# Patient Record
Sex: Male | Born: 1955
Health system: Southern US, Community
[De-identification: ages and names within clinical notes are randomized; demographics above are authoritative.]

## PROBLEM LIST (undated history)

## (undated) DIAGNOSIS — I4891 Unspecified atrial fibrillation: Secondary | ICD-10-CM

## (undated) DIAGNOSIS — R55 Syncope and collapse: Secondary | ICD-10-CM

## (undated) DIAGNOSIS — R7989 Other specified abnormal findings of blood chemistry: Secondary | ICD-10-CM

## (undated) DIAGNOSIS — I5189 Other ill-defined heart diseases: Secondary | ICD-10-CM

## (undated) DIAGNOSIS — R0981 Nasal congestion: Secondary | ICD-10-CM

## (undated) DIAGNOSIS — K8042 Calculus of bile duct with acute cholecystitis without obstruction: Secondary | ICD-10-CM

## (undated) DIAGNOSIS — N183 Chronic kidney disease, stage 3 unspecified: Secondary | ICD-10-CM

## (undated) DIAGNOSIS — E119 Type 2 diabetes mellitus without complications: Secondary | ICD-10-CM

## (undated) DIAGNOSIS — I7121 Aneurysm of the ascending aorta, without rupture: Secondary | ICD-10-CM

## (undated) DIAGNOSIS — Z9989 Dependence on other enabling machines and devices: Secondary | ICD-10-CM

## (undated) DIAGNOSIS — G4733 Obstructive sleep apnea (adult) (pediatric): Secondary | ICD-10-CM

## (undated) DIAGNOSIS — K859 Acute pancreatitis without necrosis or infection, unspecified: Secondary | ICD-10-CM

## (undated) DIAGNOSIS — I2510 Atherosclerotic heart disease of native coronary artery without angina pectoris: Secondary | ICD-10-CM

## (undated) DIAGNOSIS — M1990 Unspecified osteoarthritis, unspecified site: Secondary | ICD-10-CM

## (undated) DIAGNOSIS — I350 Nonrheumatic aortic (valve) stenosis: Secondary | ICD-10-CM

## (undated) DIAGNOSIS — IMO0001 Reserved for inherently not codable concepts without codable children: Secondary | ICD-10-CM

## (undated) DIAGNOSIS — R945 Abnormal results of liver function studies: Secondary | ICD-10-CM

## (undated) DIAGNOSIS — N200 Calculus of kidney: Secondary | ICD-10-CM

## (undated) DIAGNOSIS — R202 Paresthesia of skin: Secondary | ICD-10-CM

## (undated) DIAGNOSIS — K8050 Calculus of bile duct without cholangitis or cholecystitis without obstruction: Secondary | ICD-10-CM

## (undated) DIAGNOSIS — R918 Other nonspecific abnormal finding of lung field: Secondary | ICD-10-CM

## (undated) DIAGNOSIS — R200 Anesthesia of skin: Secondary | ICD-10-CM

## (undated) DIAGNOSIS — G4700 Insomnia, unspecified: Secondary | ICD-10-CM

## (undated) DIAGNOSIS — J329 Chronic sinusitis, unspecified: Secondary | ICD-10-CM

## (undated) DIAGNOSIS — G5600 Carpal tunnel syndrome, unspecified upper limb: Secondary | ICD-10-CM

## (undated) DIAGNOSIS — Z87442 Personal history of urinary calculi: Secondary | ICD-10-CM

## (undated) DIAGNOSIS — K760 Fatty (change of) liver, not elsewhere classified: Secondary | ICD-10-CM

## (undated) DIAGNOSIS — E785 Hyperlipidemia, unspecified: Secondary | ICD-10-CM

## (undated) DIAGNOSIS — I6529 Occlusion and stenosis of unspecified carotid artery: Secondary | ICD-10-CM

## (undated) DIAGNOSIS — I10 Essential (primary) hypertension: Secondary | ICD-10-CM

## (undated) HISTORY — DX: Acute pancreatitis without necrosis or infection, unspecified: K85.90

## (undated) HISTORY — DX: Calculus of bile duct without cholangitis or cholecystitis without obstruction: K80.50

## (undated) HISTORY — DX: Paresthesia of skin: R20.2

## (undated) HISTORY — DX: Carpal tunnel syndrome, unspecified upper limb: G56.00

## (undated) HISTORY — DX: Essential (primary) hypertension: I10

## (undated) HISTORY — DX: Nonrheumatic aortic (valve) stenosis: I35.0

## (undated) HISTORY — DX: Other specified abnormal findings of blood chemistry: R79.89

## (undated) HISTORY — DX: Nasal congestion: R09.81

## (undated) HISTORY — DX: Fatty (change of) liver, not elsewhere classified: K76.0

## (undated) HISTORY — DX: Type 2 diabetes mellitus without complications: E11.9

## (undated) HISTORY — DX: Calculus of bile duct with acute cholecystitis without obstruction: K80.42

## (undated) HISTORY — DX: Abnormal results of liver function studies: R94.5

## (undated) HISTORY — PX: CORONARY ANGIOPLASTY WITH STENT PLACEMENT: SHX49

## (undated) HISTORY — DX: Hyperlipidemia, unspecified: E78.5

## (undated) HISTORY — DX: Obstructive sleep apnea (adult) (pediatric): G47.33

## (undated) HISTORY — DX: Calculus of kidney: N20.0

## (undated) HISTORY — DX: Reserved for inherently not codable concepts without codable children: IMO0001

## (undated) HISTORY — DX: Unspecified atrial fibrillation: I48.91

## (undated) HISTORY — DX: Other ill-defined heart diseases: I51.89

## (undated) HISTORY — DX: Chronic sinusitis, unspecified: J32.9

## (undated) HISTORY — DX: Dependence on other enabling machines and devices: Z99.89

## (undated) HISTORY — DX: Atherosclerotic heart disease of native coronary artery without angina pectoris: I25.10

## (undated) HISTORY — DX: Unspecified osteoarthritis, unspecified site: M19.90

## (undated) HISTORY — PX: CARPAL TUNNEL RELEASE: SHX101

## (undated) HISTORY — PX: TISSUE AORTIC VALVE REPLACEMENT: SHX2527

---

## 2004-02-19 DIAGNOSIS — R202 Paresthesia of skin: Secondary | ICD-10-CM

## 2004-02-19 HISTORY — DX: Paresthesia of skin: R20.2

## 2004-07-06 DIAGNOSIS — G4733 Obstructive sleep apnea (adult) (pediatric): Secondary | ICD-10-CM

## 2004-07-06 HISTORY — DX: Obstructive sleep apnea (adult) (pediatric): G47.33

## 2004-10-10 ENCOUNTER — Ambulatory Visit (HOSPITAL_BASED_OUTPATIENT_CLINIC_OR_DEPARTMENT_OTHER): Admission: RE | Admit: 2004-10-10 | Discharge: 2004-10-10 | Payer: Self-pay | Admitting: Orthopedic Surgery

## 2004-10-24 ENCOUNTER — Ambulatory Visit (HOSPITAL_BASED_OUTPATIENT_CLINIC_OR_DEPARTMENT_OTHER): Admission: RE | Admit: 2004-10-24 | Discharge: 2004-10-24 | Payer: Self-pay | Admitting: Orthopedic Surgery

## 2005-02-04 ENCOUNTER — Encounter: Payer: Self-pay | Admitting: Cardiology

## 2005-02-05 ENCOUNTER — Observation Stay (HOSPITAL_COMMUNITY): Admission: AD | Admit: 2005-02-05 | Discharge: 2005-02-06 | Payer: Self-pay | Admitting: Cardiology

## 2005-02-05 ENCOUNTER — Ambulatory Visit: Payer: Self-pay | Admitting: *Deleted

## 2005-02-05 ENCOUNTER — Ambulatory Visit: Payer: Self-pay | Admitting: Cardiology

## 2005-02-11 ENCOUNTER — Ambulatory Visit: Payer: Self-pay | Admitting: Cardiology

## 2005-02-25 ENCOUNTER — Ambulatory Visit: Payer: Self-pay | Admitting: Cardiology

## 2005-04-28 ENCOUNTER — Encounter: Payer: Self-pay | Admitting: Cardiology

## 2007-09-01 ENCOUNTER — Encounter: Payer: Self-pay | Admitting: Cardiology

## 2007-09-07 ENCOUNTER — Ambulatory Visit: Payer: Self-pay | Admitting: Cardiology

## 2008-02-04 DIAGNOSIS — I350 Nonrheumatic aortic (valve) stenosis: Secondary | ICD-10-CM

## 2008-02-04 DIAGNOSIS — I5189 Other ill-defined heart diseases: Secondary | ICD-10-CM

## 2008-02-04 DIAGNOSIS — I2510 Atherosclerotic heart disease of native coronary artery without angina pectoris: Secondary | ICD-10-CM

## 2008-02-04 HISTORY — DX: Other ill-defined heart diseases: I51.89

## 2008-02-04 HISTORY — DX: Nonrheumatic aortic (valve) stenosis: I35.0

## 2008-02-04 HISTORY — DX: Atherosclerotic heart disease of native coronary artery without angina pectoris: I25.10

## 2008-12-04 DIAGNOSIS — M1990 Unspecified osteoarthritis, unspecified site: Secondary | ICD-10-CM

## 2008-12-04 HISTORY — DX: Unspecified osteoarthritis, unspecified site: M19.90

## 2009-05-22 ENCOUNTER — Encounter: Payer: Self-pay | Admitting: Cardiology

## 2009-05-24 DIAGNOSIS — I10 Essential (primary) hypertension: Secondary | ICD-10-CM | POA: Insufficient documentation

## 2009-05-24 DIAGNOSIS — E1165 Type 2 diabetes mellitus with hyperglycemia: Secondary | ICD-10-CM

## 2009-05-24 DIAGNOSIS — E876 Hypokalemia: Secondary | ICD-10-CM | POA: Insufficient documentation

## 2009-05-24 DIAGNOSIS — E785 Hyperlipidemia, unspecified: Secondary | ICD-10-CM | POA: Insufficient documentation

## 2009-05-24 DIAGNOSIS — I200 Unstable angina: Secondary | ICD-10-CM | POA: Insufficient documentation

## 2009-05-24 DIAGNOSIS — R079 Chest pain, unspecified: Secondary | ICD-10-CM | POA: Insufficient documentation

## 2009-05-24 DIAGNOSIS — IMO0001 Reserved for inherently not codable concepts without codable children: Secondary | ICD-10-CM | POA: Insufficient documentation

## 2009-05-24 DIAGNOSIS — R5381 Other malaise: Secondary | ICD-10-CM | POA: Insufficient documentation

## 2009-05-24 DIAGNOSIS — R0602 Shortness of breath: Secondary | ICD-10-CM | POA: Insufficient documentation

## 2009-05-24 DIAGNOSIS — R5383 Other fatigue: Secondary | ICD-10-CM

## 2009-05-24 DIAGNOSIS — G4710 Hypersomnia, unspecified: Secondary | ICD-10-CM | POA: Insufficient documentation

## 2009-05-24 DIAGNOSIS — G4733 Obstructive sleep apnea (adult) (pediatric): Secondary | ICD-10-CM | POA: Insufficient documentation

## 2009-05-24 DIAGNOSIS — I959 Hypotension, unspecified: Secondary | ICD-10-CM | POA: Insufficient documentation

## 2009-05-24 DIAGNOSIS — G4730 Sleep apnea, unspecified: Secondary | ICD-10-CM | POA: Insufficient documentation

## 2009-05-27 ENCOUNTER — Ambulatory Visit: Payer: Self-pay | Admitting: Cardiology

## 2009-05-27 DIAGNOSIS — M1990 Unspecified osteoarthritis, unspecified site: Secondary | ICD-10-CM | POA: Insufficient documentation

## 2009-05-27 DIAGNOSIS — I359 Nonrheumatic aortic valve disorder, unspecified: Secondary | ICD-10-CM | POA: Insufficient documentation

## 2009-06-04 ENCOUNTER — Ambulatory Visit: Payer: Self-pay | Admitting: Cardiology

## 2009-06-04 ENCOUNTER — Encounter (INDEPENDENT_AMBULATORY_CARE_PROVIDER_SITE_OTHER): Payer: Self-pay | Admitting: *Deleted

## 2009-06-05 ENCOUNTER — Encounter: Payer: Self-pay | Admitting: Cardiology

## 2009-06-06 ENCOUNTER — Encounter: Payer: Self-pay | Admitting: Cardiology

## 2009-06-07 ENCOUNTER — Ambulatory Visit: Payer: Self-pay | Admitting: Cardiology

## 2009-06-07 ENCOUNTER — Inpatient Hospital Stay (HOSPITAL_BASED_OUTPATIENT_CLINIC_OR_DEPARTMENT_OTHER): Admission: RE | Admit: 2009-06-07 | Discharge: 2009-06-07 | Payer: Self-pay | Admitting: Internal Medicine

## 2009-06-07 ENCOUNTER — Inpatient Hospital Stay (HOSPITAL_COMMUNITY): Admission: AD | Admit: 2009-06-07 | Discharge: 2009-06-08 | Payer: Self-pay | Admitting: Cardiology

## 2009-06-07 ENCOUNTER — Ambulatory Visit: Payer: Self-pay | Admitting: Internal Medicine

## 2009-06-08 ENCOUNTER — Encounter: Payer: Self-pay | Admitting: Cardiology

## 2009-06-14 ENCOUNTER — Encounter: Payer: Self-pay | Admitting: Cardiology

## 2009-06-18 ENCOUNTER — Encounter: Payer: Self-pay | Admitting: Cardiology

## 2009-06-21 ENCOUNTER — Encounter: Payer: Self-pay | Admitting: Cardiology

## 2009-06-24 ENCOUNTER — Telehealth (INDEPENDENT_AMBULATORY_CARE_PROVIDER_SITE_OTHER): Payer: Self-pay | Admitting: *Deleted

## 2009-06-26 ENCOUNTER — Ambulatory Visit (HOSPITAL_COMMUNITY): Admission: RE | Admit: 2009-06-26 | Discharge: 2009-06-26 | Payer: Self-pay | Admitting: Urology

## 2009-07-04 ENCOUNTER — Telehealth (INDEPENDENT_AMBULATORY_CARE_PROVIDER_SITE_OTHER): Payer: Self-pay | Admitting: *Deleted

## 2009-07-09 ENCOUNTER — Ambulatory Visit: Payer: Self-pay | Admitting: Cardiology

## 2009-07-09 DIAGNOSIS — I2510 Atherosclerotic heart disease of native coronary artery without angina pectoris: Secondary | ICD-10-CM | POA: Insufficient documentation

## 2009-07-19 ENCOUNTER — Encounter: Payer: Self-pay | Admitting: Cardiology

## 2010-02-10 ENCOUNTER — Encounter: Payer: Self-pay | Admitting: Cardiology

## 2010-06-13 ENCOUNTER — Encounter: Payer: Self-pay | Admitting: Cardiology

## 2010-07-22 ENCOUNTER — Encounter: Payer: Self-pay | Admitting: Cardiology

## 2010-07-30 ENCOUNTER — Encounter (INDEPENDENT_AMBULATORY_CARE_PROVIDER_SITE_OTHER): Payer: Self-pay | Admitting: *Deleted

## 2010-07-30 ENCOUNTER — Ambulatory Visit
Admission: RE | Admit: 2010-07-30 | Discharge: 2010-07-30 | Payer: Self-pay | Source: Home / Self Care | Attending: Cardiology | Admitting: Cardiology

## 2010-07-30 ENCOUNTER — Encounter: Payer: Self-pay | Admitting: Cardiology

## 2010-07-30 DIAGNOSIS — I739 Peripheral vascular disease, unspecified: Secondary | ICD-10-CM | POA: Insufficient documentation

## 2010-07-30 DIAGNOSIS — I059 Rheumatic mitral valve disease, unspecified: Secondary | ICD-10-CM | POA: Insufficient documentation

## 2010-08-04 ENCOUNTER — Encounter: Payer: Self-pay | Admitting: Cardiology

## 2010-08-05 ENCOUNTER — Encounter: Payer: Self-pay | Admitting: Cardiology

## 2010-08-05 NOTE — Letter (Signed)
Summary: External Correspondence/ OFFICE NOTE [facility]  External Correspondence/ OFFICE NOTE [facility]   Imported By: Dorise Hiss 02/12/2010 16:30:16  _____________________________________________________________________  External Attachment:    Type:   Image     Comment:   External Document

## 2010-08-05 NOTE — Letter (Signed)
Summary: [facility] D/C SUMMARY DR. Orvan Falconer  [facility] D[facility]C SUMMARY DR. Orvan Falconer   Imported By: Zachary George 07/09/2009 13:42:02  _____________________________________________________________________  External Attachment:    Type:   Image     Comment:   External Document

## 2010-08-05 NOTE — Letter (Signed)
Summary: External Correspondence/ OFFICE NOTE [facility]  External Correspondence/ OFFICE NOTE [facility]   Imported By: Dorise Hiss 07/23/2009 14:31:33  _____________________________________________________________________  External Attachment:    Type:   Image     Comment:   External Document

## 2010-08-05 NOTE — Assessment & Plan Note (Signed)
Summary: 1 mo fu reminder-srs   Visit Type:  Follow-up Primary Provider:  Rudi Heap  CC:  follow-up visit.  History of Present Illness: the patient is a 55 year old male with a history of nonobstructive coronary arteries using 2006.  He has an aortic murmur consistent with aortic stenosis.  He had a recent cardiac catheterization which demonstrated normal right heart pressures and a mild gradient across the aortic valve.  There was a high-grade stenosis in the midright coronary artery and underwent percutaneous stenting of the midright coronary artery.  The patient presents for follow-up.  He has been doing well.  He reports no chest pain short of breath orthopnea PND.  He had a recent the kidney stone.  Apparently the stone was retrieved while still on Plavix therapy and he had no bleeding.  The patient denies any orthopnea PND palpitations or syncope.   Preventive Screening-Counseling & Management  Alcohol-Tobacco     Smoking Status: quit     Year Quit: 2004  Current Problems (verified): 1)  Osteoarthritis, Localized  (ICD-715.30) 2)  Aortic Valve Disorders  (ICD-424.1) 3)  Hypertension  (ICD-401.9) 4)  Shortness of Breath  (ICD-786.05) 5)  Unspecified Hypotension  (ICD-458.9) 6)  Unspecified Sleep Apnea  (ICD-780.57) 7)  Hypopotassemia  (ICD-276.8) 8)  Other Malaise and Fatigue  (ICD-780.79) 9)  Chest Pain Unspecified  (ICD-786.50) 10)  Hyperlipidemia  (ICD-272.4) 11)  Dm  (ICD-250.00) 12)  Intermediate Coronary Syndrome  (ICD-411.1) 13)  Hypersomnia With Sleep Apnea Unspecified  (ICD-780.53)  Current Medications (verified): 1)  Lotrel 10-20 Mg Caps (Amlodipine Besy-Benazepril Hcl) .... Take 1 Tablet By Mouth Once A Day 2)  Zocor 40 Mg Tabs (Simvastatin) .... Take 1 Tablet By Mouth Once A Day 3)  Aspirin 81 Mg Tbec (Aspirin) .... Take 1 Tablet By Mouth Once A Day 4)  Nitrostat 0.4 Mg Subl (Nitroglycerin) .[name].. 1 Tablet Under Tongue At Onset of Chest Pain; You May Repeat  Every 5 Minutes For Up To 3 Doses. 5)  Plavix 75 Mg Tabs (Clopidogrel Bisulfate) .... Take 1 Tablet By Mouth Once A Day 6)  Glipizide Xl 2.5 Mg Xr24h-Tab (Glipizide) .... Take 1 Tablet By Mouth Once A Day  Allergies (verified): 1)  ! Sulfa  Comments:  Nurse/Medical Assistant: The patient's medications and allergies were reviewed with the patient and were updated in the Medication and Allergy Lists. Patient brought list to office.  Past History:  Past Medical History: Last updated: 05/24/2009 Substernal Chest pains/acute coronary syndrome, typical features. Diabetes Mellitus  type II Hypertension Dyslipidemia Hx of Obstructive sleep apnea  Mild Hypokalemia       Past Surgical History: Last updated: 05/24/2009  1.  Left heart catheterization.  2.  Selective coronary angiography.  3.  Left ventriculography.  4.     Release of right transverse carpal ligament.  Family History: Last updated: 05/24/2009 Strong family history of CAD in both father and mother Mother had bypass surgery in her 75's Father had BiPap surgery at later age in his 5's  Social History: Last updated: 05/27/2009 Tobacco Use - No.   Risk Factors: Smoking Status: quit (07/09/2009)  Social History: Smoking Status:  quit  Review of Systems  The patient denies fatigue, malaise, fever, weight gain/loss, vision loss, decreased hearing, hoarseness, chest pain, palpitations, shortness of breath, prolonged cough, wheezing, sleep apnea, coughing up blood, abdominal pain, blood in stool, nausea, vomiting, diarrhea, heartburn, incontinence, blood in urine, muscle weakness, joint pain, leg swelling, rash, skin lesions, headache, fainting, dizziness,  depression, anxiety, enlarged lymph nodes, easy bruising or bleeding, and environmental allergies.    Vital Signs:  Patient profile:   55 year old male Height:      70 inches Weight:      218 pounds Pulse rate:   91 / minute BP sitting:   138 / 82  (left  arm) Cuff size:   large  Vitals Entered By: Carlye Grippe (July 09, 2009 3:25 PM) CC: follow-up visit   Physical Exam  Additional Exam:  General: Well-developed, well-nourished in no distress head: Normocephalic and atraumatic eyes PERRLA/EOMI intact, conjunctiva and lids normal nose: No deformity or lesions mouth normal dentition, normal posterior pharynx neck: Supple, no JVD.  No masses, thyromegaly or abnormal cervical nodes, nopulsus parvus et tardus lungs: Normal breath sounds bilaterally without wheezing.  Normal percussion heart: Rectal rate and rhythm with normal S1 and S2, no S3 or S4.  PMI is normal. there is a 3/6 crescendo decrescendo murmur at the right upper sternal border.  Murmurs consistent with aortic stenosis.  abdomen: Normal bowel sounds, abdomen is soft and nontender without masses, organomegaly or hernias noted.  No hepatosplenomegaly musculoskeletal: Back normal, normal gait muscle strength and tone normal pulsus: Pulse is normal in all 4 extremities Extremities: No peripheral pitting edema neurologic: Alert and oriented x 3 skin: Intact without lesions or rashes cervical nodes: No significant adenopathy psychologic: Normal affect    Cardiac Cath  Procedure date:  06/07/2009  Findings:      ANGIOGRAPHIC DATA:  The right coronary artery demonstrates 90% stenosis in the midportion prior to percutaneous intervention and 0% residual luminal narrowing.  There is diffuse disease in the distal right coronary.  Posterolateral segment has a 70-80% stenosis.  The vessel here is small.   CONCLUSION:  Successful percutaneous stenting of the right coronary artery as noted above.   DISPOSITION:  The patient will remain on aspirin and Plavix.  Follow up with Dr. Andee Lineman.  If he continues to have symptoms, some consideration could be given to placing a distal stent, but it is in a small distal area and conservative management likely initially would be  recommended.    Impression & Recommendations:  Problem # 1:  AORTIC VALVE DISORDERS (ICD-424.1) the patient has moderate aortic stenosis.  Is currently asymptomatic we will follow up with an echocardiogram in one year. The following medications were removed from the medication list:    Hydrochlorothiazide 25 Mg Tabs (Hydrochlorothiazide) .[name]... Take one tablet by mouth daily. His updated medication list for this problem includes:    Lotrel 10-20 Mg Caps (Amlodipine besy-benazepril hcl) .[name]... Take 1 tablet by mouth once a day    Nitrostat 0.4 Mg Subl (Nitroglycerin) .[name]... 1 tablet under tongue at onset of chest pain; you may repeat every 5 minutes for up to 3 doses.  Problem # 2:  CORONARY ARTERY DISEASE, S/P PTCA (ICD-414.9) the patient is status-post recent percutaneous coronary intervention with stenting of the right coronary artery.  The patient received a drug alluding stent.  He will remain on aspirin and Plavix.  He reports no recurrent chest pain. His updated medication list for this problem includes:    Lotrel 10-20 Mg Caps (Amlodipine besy-benazepril hcl) .[name]... Take 1 tablet by mouth once a day    Aspirin 81 Mg Tbec (Aspirin) .[name]... Take 1 tablet by mouth once a day    Nitrostat 0.4 Mg Subl (Nitroglycerin) .[name]... 1 tablet under tongue at onset of chest pain; you may repeat every  5 minutes for up to 3 doses.    Plavix 75 Mg Tabs (Clopidogrel bisulfate) .[name]... Take 1 tablet by mouth once a day  Problem # 3:  HYPERTENSION (ICD-401.9) Assessment: Improved  The following medications were removed from the medication list:    Hydrochlorothiazide 25 Mg Tabs (Hydrochlorothiazide) .[name]... Take one tablet by mouth daily. His updated medication list for this problem includes:    Lotrel 10-20 Mg Caps (Amlodipine besy-benazepril hcl) .[name]... Take 1 tablet by mouth once a day    Aspirin 81 Mg Tbec (Aspirin) .[name]... Take 1 tablet by mouth once a day  Problem # 4:  UNSPECIFIED SLEEP APNEA  (ICD-780.57) Assessment: Comment Only  Problem # 5:  HYPERLIPIDEMIA (ICD-272.4) the patient will have follow-up blood work done at [facility]. His updated medication list for this problem includes:    Zocor 40 Mg Tabs (Simvastatin) .[name]... Take 1 tablet by mouth once a day  Patient Instructions: 1)  echo for aortic stenosis in 1 year (just before next visit) 2)  Follow up in 1 year.

## 2010-08-05 NOTE — Miscellaneous (Signed)
  Clinical Lists Changes  Orders: Added new Referral order of 2-D Echocardiogram (2D Echo) - Signed

## 2010-08-07 ENCOUNTER — Encounter: Payer: Self-pay | Admitting: Cardiology

## 2010-08-07 NOTE — Letter (Signed)
Summary: Graded Exercise Tolerance Test  [facility] at [facility] [address]   [place], [place] [address]   Phone: [phone]  Fax: [phone]      [facility]  Graded Exercise Tolerance Test    [name] [name]  Appointment Date:_  Appointment Time:_   Your doctor has ordered a stress test to help determine the condition of your  heart during exercise. If you take blood pressure medicine , ask your doctor if you should take it the day of your test. You may eat a light meal before your test.  Please be sure to bring the copy of your order with you.   You should dress comfortably, for example: Sweat pants, shorts, or skirt, Loose                                                                                                       short sleeved T-shirt, Rubber soled lace-up shoes (tennis shoes)  You will need to arrive 15 minutes before your appointment time. You will also need to enter at the Main Entrance of the hospital and go to the registration desk. They will direct you to the Cardiovascular Department on the third floor.  You will need to plan on being at the hospital for one hour from registration for this appointment.

## 2010-08-07 NOTE — Assessment & Plan Note (Signed)
Summary: 1 YR FU RECV REMINDER VS   Visit Type:  Follow-up Primary Provider:  Rudi Heap   History of Present Illness: the patient is a 55 year old male with history of nonobstructive coronary artery disease by catheterization 2006.  He has known aortic stenosis.  Catheterization a little over a year ago demonstrated normal right heart pressures and a mild gradient across the aortic valve, very calcified valve visible by fluoroscopy.  The patient did have a high-grade stenosis in the midright coronary artery and underwent stent placement.  The patient had a recent follow-up echocardiogram done in his aortic stenosis is now more severe with an outflow tract velocity of 3.87 m/sec.  Clinically the patient also reports that he has significant shortness of breath.  He has dyspnea on minimal exertion.  He states that he gives out easily and his exercise tolerance is decreased.  He denies any palpitations or syncope however has no orthopnea or PND  The patient also reports symptoms of claudication the left lower extremity and quite minimal exertion.  His pulses are diminished in the left leg.  Preventive Screening-Counseling & Management  Alcohol-Tobacco     Smoking Status: quit     Year Quit: 2004  Current Medications (verified): 1)  Lotrel 10-20 Mg Caps (Amlodipine Besy-Benazepril Hcl) .... Take 1 Tablet By Mouth Once A Day 2)  Lipitor 40 Mg Tabs (Atorvastatin Calcium) .... Take 1 Tablet By Mouth Once A Day 3)  Aspirin 81 Mg Tbec (Aspirin) .... Take 2 Tablet By Mouth Once A Day 4)  Nitrostat 0.4 Mg Subl (Nitroglycerin) .[name].. 1 Tablet Under Tongue At Onset of Chest Pain; You May Repeat Every 5 Minutes For Up To 3 Doses. 5)  Plavix 75 Mg Tabs (Clopidogrel Bisulfate) .... Take 1 Tablet By Mouth Once A Day 6)  Glipizide 5 Mg Tabs (Glipizide) .... Take 1 Tablet By Mouth Once A Day 7)  Vitamin D 1000 Unit Tabs (Cholecalciferol) .... Take 1 Tablet By Mouth Once A Day 8)  Niaspan 500 Mg Cr-Tabs  (Niacin (Antihyperlipidemic)) .... Take 1 Tablet By Mouth Once A Day 9)  Metformin Hcl 500 Mg Tabs (Metformin Hcl) .... Take 1 Tablet By Mouth Two Times A Day 10)  Voltaren 1 % Gel (Diclofenac Sodium) .... Apply Topically As Needed  Allergies (verified): 1)  ! Sulfa  Comments:  Nurse/Medical Assistant: The patient's medication list and allergies were reviewed with the patient and were updated in the Medication and Allergy Lists.   Past History:  Past Medical History: Last updated: 05/24/2009 Substernal Chest pains/acute coronary syndrome, typical features. Diabetes Mellitus  type II Hypertension Dyslipidemia Hx of Obstructive sleep apnea  Mild Hypokalemia       Past Surgical History: Last updated: 05/24/2009  1.  Left heart catheterization.  2.  Selective coronary angiography.  3.  Left ventriculography.  4.     Release of right transverse carpal ligament.  Family History: Last updated: 05/24/2009 Strong family history of CAD in both father and mother Mother had bypass surgery in her 82's Father had BiPap surgery at later age in his 45's  Social History: Last updated: 05/27/2009 Tobacco Use - No.   Risk Factors: Smoking Status: quit (07/30/2010)  Review of Systems       The patient complains of fatigue and shortness of breath.  The patient denies malaise, fever, weight gain/loss, vision loss, decreased hearing, hoarseness, chest pain, palpitations, prolonged cough, wheezing, sleep apnea, coughing up blood, abdominal pain, blood in stool, nausea, vomiting, diarrhea, heartburn,  incontinence, blood in urine, muscle weakness, joint pain, leg swelling, rash, skin lesions, headache, fainting, dizziness, depression, anxiety, enlarged lymph nodes, easy bruising or bleeding, and environmental allergies.         decreased exercise tolerance  Vital Signs:  Patient profile:   55 year old male Height:      70 inches Weight:      215 pounds BMI:     30.96 Pulse rate:   83 /  minute BP sitting:   150 / 89  (left arm) Cuff size:   large  Vitals Entered By: Carlye Grippe (July 30, 2010 2:39 PM)  Nutrition Counseling: Patient's BMI is greater than 25 and therefore counseled on weight management options.  Physical Exam  Additional Exam:  General: Well-developed, well-nourished in no distress head: Normocephalic and atraumatic eyes PERRLA/EOMI intact, conjunctiva and lids normal nose: No deformity or lesions mouth normal dentition, normal posterior pharynx neck: Supple, no JVD.  No masses, thyromegaly or abnormal cervical nodes, pulsus parvus et tardus lungs: Normal breath sounds bilaterally without wheezing.  Normal percussion heart: Rectal rate and rhythm with normal S1 and S2, no S3 or S4.  PMI is normal. there is a 4/6 crescendo decrescendo murmur at the right upper sternal border.  Murmurs consistent with aortic stenosis. abdomen: Normal bowel sounds, abdomen is soft and nontender without masses, organomegaly or hernias noted.  No hepatosplenomegaly musculoskeletal: Back normal, normal gait muscle strength and tone normal pulsus:1+ distal pulses in dorsalis pedis and posterior tibial pulse of the left leg 2+ in the right extremity Extremities: No peripheral pitting edema neurologic: Alert and oriented x 3 skin: Intact without lesions or rashes cervical nodes: No significant adenopathy psychologic: Normal affect    EKG  Procedure date:  07/30/2010  Findings:      normal sinus rhythm no acute ischemic changes  Impression & Recommendations:  Problem # 1:  CLAUDICATION (ICD-443.9) difficult to palpate pulses the dorsalis pedis and posterior tibial in the left leg.  We will obtain ABIs  Problem # 2:  CORONARY ARTERY DISEASE, S/P PTCA (ICD-414.9) the patient's status post prior coronary intervention.  He has no chest pain.  His EKG shows no acute ischemic changes. His updated medication list for this problem includes:    Lotrel 10-20 Mg Caps  (Amlodipine besy-benazepril hcl) .[name]... Take 1 tablet by mouth once a day    Aspirin 81 Mg Tbec (Aspirin) .[name]... Take 2 tablet by mouth once a day    Nitrostat 0.4 Mg Subl (Nitroglycerin) .[name]... 1 tablet under tongue at onset of chest pain; you may repeat every 5 minutes for up to 3 doses.    Plavix 75 Mg Tabs (Clopidogrel bisulfate) .[name]... Take 1 tablet by mouth once a day  Orders: EKG w/ Interpretation (93000)  Problem # 3:  AORTIC VALVE DISORDERS (ICD-424.1) it appears that the patient has symptomatic aortic stenosis.  We'll proceed with a transesophageal echocardiogram to further assess his aortic valve and there is question about the severity will proceed with an exercise treadmill test to assess his functional capacity. His updated medication list for this problem includes:    Lotrel 10-20 Mg Caps (Amlodipine besy-benazepril hcl) .[name]... Take 1 tablet by mouth once a day    Nitrostat 0.4 Mg Subl (Nitroglycerin) .[name]... 1 tablet under tongue at onset of chest pain; you may repeat every 5 minutes for up to 3 doses.  Other Orders: Trans Esophageal Echocardiogram (TEE) GXT (GXT) ABI (ABI)  Patient Instructions: 1)  TEE -  needs to be done prior to test below 2)  GXT - MD need to be present  3)  ABI 4)  GXT & ABI can be done on same day.   5)  Information sheets given on above test. 6)  Follow up in  1 month

## 2010-08-07 NOTE — Letter (Signed)
Summary: TEE Instructions Letter  All     ,     Phone:   Fax:     07/30/2010 MRN: [id number]  [facility] [address] [place], [place]  [address]     TEE Instructions    You are scheduled for a TEE on Monday, January 30 at 10:00 with Dr. Andee Lineman.  Please arrive at  [facility] at 9:00 a.m. on the day of your procedure.  1)   Diet:     A)  Nothing to eat or drink after midnight except your medications with        a sip of water.    B)   May have clear liquid breakfast.  Clear liquids include:  water, broth,        Sprite, Ginger Ale, black cofee, tea (no sugar), cranberry / grape /        apple juice, jello (not red), popsicle from clear juices (not red).  2)   Must have a responsible person to drive you home.  3)   Bring your current insurance cards and current list of all your medications.   *Special Note:  Every effort is made to have your procedure done on time.  Occasionally there are emergencies that present themselves at the hospital that may cause delays.  Please be patient if a delay does occur.  *If you have any questions after you get home, please call the office at [phone].

## 2010-08-11 ENCOUNTER — Encounter (INDEPENDENT_AMBULATORY_CARE_PROVIDER_SITE_OTHER): Payer: Self-pay | Admitting: *Deleted

## 2010-08-12 ENCOUNTER — Encounter (INDEPENDENT_AMBULATORY_CARE_PROVIDER_SITE_OTHER): Payer: BC Managed Care – PPO | Admitting: Surgery

## 2010-08-12 ENCOUNTER — Encounter: Payer: Self-pay | Admitting: Cardiology

## 2010-08-12 DIAGNOSIS — I359 Nonrheumatic aortic valve disorder, unspecified: Secondary | ICD-10-CM

## 2010-08-13 ENCOUNTER — Telehealth (INDEPENDENT_AMBULATORY_CARE_PROVIDER_SITE_OTHER): Payer: Self-pay | Admitting: *Deleted

## 2010-08-13 NOTE — Miscellaneous (Signed)
Summary: Orders Update - TCTS referral  Clinical Lists Changes  Orders: Added new Referral order of TCTS Referral (TCTS Ref) - Signed 

## 2010-08-15 NOTE — Consult Note (Signed)
NEW PATIENT CONSULTATION  [name] [name], [name] [name] DOB:  1955-08-15                                        August 12, 2010 CHART #:  [id number]  REFERRING PHYSICIAN:  Learta Codding, MD, [facility]  REASON FOR CONSULTATION:  Severe aortic stenosis.  CLINICAL HISTORY:  I was asked by Dr. Andee Lineman to evaluate the patient for consideration of aortic valve replacement.  He is a 55 year old gentleman with a history of diabetes, hypertension, and dyslipidemia, as well as single-vessel coronary disease status post stenting of his mid right coronary artery in November of 2010.  He has a known history of aortic stenosis, has been followed with echocardiogram.  He reports that over the past several months, he has noticed some worsening shortness of breath with exertion.  This has occurred with less and less exertion and now is at the point where he has shortness of breath with minimal exertion.  His exercise tolerance has been decreased.  He has had some intermittent chest pain and pressure over the same period of time usually related to exertion.  He denies any dizziness or syncope.  He underwent a transesophageal echocardiogram on August 04, 2010.  This showed his aortic valve to be bicuspid with moderate aortic leaflet calcification.  There was effusion in the right noncoronary cusp. Aortic valve area was measured by planimetry to be 0.97 cm squared.  His ejection fraction was 60-65% with normal left ventricular function. There was a trace mitral regurgitation.  There was trace tricuspid regurgitation.  The right ventricular function appeared normal.  He underwent a cardiac exercise stress test on August 05, 2010, and this showed nondiagnostic EKG changes.  The patient exercised for about 5 minutes and was terminated due to fatigue and dyspnea.  He had no chest pain.  His review of systems is as follows: GENERAL:  He denies any fever or chills.  He has had no recent weight changes.   He does report some fatigue over the past several months. EYES:  Negative. ENT:  Negative. ENDOCRINE:  He has adult-onset diabetes.  He denies hypothyroidism. CARDIOVASCULAR:  As above.  He has had some intermittent chest pain or pressure.  He denies orthopnea and PND, but has had exertional dyspnea. Denies palpitations and peripheral edema. RESPIRATORY:  Denies cough and sputum production. GI:  He denies nausea or vomiting.  He has had no melena or bright red blood per rectum. GU:  He denies dysuria and hematuria. MUSCULOSKELETAL:  He denies arthralgias and myalgias. NEUROLOGICAL:  He denies any focal weakness or numbness.  Denies dizziness, syncope.  He has never had TIA or stroke.  VASCULAR:  He does report some claudication-type symptoms in his left calf.  He has never had DVT.  Allergies to SULFA.  His medications are: 1. Lotrel 10/20 daily. 2. Lipitor 40 mg daily. 3. Aspirin 81 mg 2 tabs daily. 4. Sublingual nitroglycerin p.r.n. 5. Plavix 75 mg daily. 6. Glipizide 5 mg daily. 7. Metformin 500 mg b.i.d. 8. Niaspan 500 mg daily. 9. Voltaren 1% gel p.r.n. 10.Vitamin D3 1000 units daily.  His past medical history is significant for hypertension, dyslipidemia, and diabetes.  He has history of obstructive sleep apnea.  He has a history of coronary disease status post stenting of his right coronary artery as mentioned above.  He is status post right carpal tunnel surgery.  FAMILY HISTORY:  There is strong family history of coronary disease in both his mother and father, both of them had bypass surgery.  Mother also reportedly had mitral valve disease and subsequently died.  SOCIAL HISTORY:  He is a nonsmoker.  He works for [employer], doing route Airline pilot.  He is married, and has three children.  He denies alcohol use.  PHYSICAL EXAMINATION:  On physical examination, blood pressure 151/87 and pulse 86 and regular, respiratory rate is 14 unlabored.   Oxygen saturation on room air is 96%.  He is a well-developed white male in no distress.  HEENT exam shows to be normocephalic and atraumatic.  Pupils are equal and reactive to light and accommodation.  Extraocular muscles are intact.  His oropharynx is clear.  Teeth are in good condition. Neck exam shows normal carotid pulses bilaterally.  There are no bruits. There is no adenopathy or thyromegaly.  Cardiac exam shows a regular rate and rhythm with a grade 3/6 systolic murmur over the aorta.  There is no diastolic murmur.  Lungs are clear.  Abdominal exam shows active bowel sounds.  His abdomen is soft, flat, and nontender.  There are no palpable masses or organomegaly.  Extremity exam shows no peripheral edema.  Pedal pulses are palpable bilaterally.  Skin is warm and dry. Neurologic exam shows to be alert and oriented x3.  Motor and sensory exam is grossly normal.  IMPRESSION:  The patient has severe aortic stenosis that has progressed over the past year and has become progressively symptomatic.  He also has a history of single-vessel coronary disease status post stenting of his mid right coronary artery in November of 2010.  He is maintained on Plavix since.  I agree that aortic valve replacement is the best treatment for this patient.  He will require cardiac catheterization preoperatively to reassess his coronary arteries given his history of coronary disease and a stent.  I discussed the alternatives of mechanical and tissue valves.  We discussed the pros and cons of both. I would typically recommend a mechanical valve, given his age.  He is adamant that he does not want to be on Coumadin and would like to have a tissue valve.  I did explain to him that the risk of structural valve deterioration over the next 15 years, this is probably about 25% in his age group and may result in the need for replacement of his valve.  He understands this, but does not want to be on Coumadin.  It  sounds like his mother had some bad experiences with bleeding on Coumadin.  I think a tissue valve was a reasonable option for him since he appears to understand the risk of future structural valve deterioration.  I discussed the surgery in general with him including all benefits and risks, including but not limited to bleeding, blood transfusion, infection, stroke, myocardial infarction, heart block requiring permanent pacemaker, organ dysfunction, and death.  He would like to proceed with Surgery as soon as possible.  We will arrange a cardiac catheterization with [facility] and plan on doing surgery the following day.  I asked him to discontinue his Plavix 5 days prior to catheterization in anticipation of proceeding right ahead with surgery. He understands and agrees to proceed.  Evelene Croon, M.D. Electronically Signed  BB/MEDQ  D:  08/12/2010  T:  08/13/2010  Job:  [id number]  cc:   Learta Codding, MD,FACC Ernestina Penna, M.D.

## 2010-08-18 ENCOUNTER — Encounter: Payer: Self-pay | Admitting: Cardiology

## 2010-08-18 ENCOUNTER — Encounter (INDEPENDENT_AMBULATORY_CARE_PROVIDER_SITE_OTHER): Payer: Self-pay | Admitting: *Deleted

## 2010-08-18 DIAGNOSIS — R9439 Abnormal result of other cardiovascular function study: Secondary | ICD-10-CM | POA: Insufficient documentation

## 2010-08-18 DIAGNOSIS — Z0279 Encounter for issue of other medical certificate: Secondary | ICD-10-CM

## 2010-08-19 ENCOUNTER — Encounter: Payer: Self-pay | Admitting: Cardiology

## 2010-08-20 ENCOUNTER — Encounter: Payer: Self-pay | Admitting: Cardiology

## 2010-08-21 ENCOUNTER — Inpatient Hospital Stay (HOSPITAL_COMMUNITY)
Admission: AD | Admit: 2010-08-21 | Discharge: 2010-08-26 | DRG: 104 | Disposition: A | Discharge status: Home / Self Care | Payer: BC Managed Care – PPO | Source: Other Acute Inpatient Hospital | Attending: Surgery | Admitting: Surgery

## 2010-08-21 ENCOUNTER — Inpatient Hospital Stay (HOSPITAL_COMMUNITY): Payer: BC Managed Care – PPO

## 2010-08-21 ENCOUNTER — Inpatient Hospital Stay (HOSPITAL_BASED_OUTPATIENT_CLINIC_OR_DEPARTMENT_OTHER)
Admission: RE | Admit: 2010-08-21 | Discharge: 2010-08-21 | Disposition: A | Discharge status: Hospice | Payer: BC Managed Care – PPO | Source: Ambulatory Visit | Attending: Cardiology | Admitting: Cardiology

## 2010-08-21 ENCOUNTER — Encounter (INDEPENDENT_AMBULATORY_CARE_PROVIDER_SITE_OTHER): Payer: Self-pay | Admitting: *Deleted

## 2010-08-21 ENCOUNTER — Encounter: Payer: Self-pay | Admitting: Cardiology

## 2010-08-21 DIAGNOSIS — Z9861 Coronary angioplasty status: Secondary | ICD-10-CM | POA: Insufficient documentation

## 2010-08-21 DIAGNOSIS — Z23 Encounter for immunization: Secondary | ICD-10-CM

## 2010-08-21 DIAGNOSIS — I10 Essential (primary) hypertension: Secondary | ICD-10-CM | POA: Diagnosis present

## 2010-08-21 DIAGNOSIS — I4891 Unspecified atrial fibrillation: Secondary | ICD-10-CM | POA: Diagnosis not present

## 2010-08-21 DIAGNOSIS — G4733 Obstructive sleep apnea (adult) (pediatric): Secondary | ICD-10-CM | POA: Diagnosis present

## 2010-08-21 DIAGNOSIS — I2510 Atherosclerotic heart disease of native coronary artery without angina pectoris: Secondary | ICD-10-CM | POA: Insufficient documentation

## 2010-08-21 DIAGNOSIS — Q231 Congenital insufficiency of aortic valve: Secondary | ICD-10-CM | POA: Diagnosis present

## 2010-08-21 DIAGNOSIS — E119 Type 2 diabetes mellitus without complications: Secondary | ICD-10-CM | POA: Diagnosis present

## 2010-08-21 DIAGNOSIS — I669 Occlusion and stenosis of unspecified cerebral artery: Secondary | ICD-10-CM

## 2010-08-21 DIAGNOSIS — Z01810 Encounter for preprocedural cardiovascular examination: Secondary | ICD-10-CM

## 2010-08-21 DIAGNOSIS — I359 Nonrheumatic aortic valve disorder, unspecified: Secondary | ICD-10-CM

## 2010-08-21 DIAGNOSIS — E785 Hyperlipidemia, unspecified: Secondary | ICD-10-CM | POA: Diagnosis present

## 2010-08-21 DIAGNOSIS — K5900 Constipation, unspecified: Secondary | ICD-10-CM | POA: Diagnosis not present

## 2010-08-21 LAB — GLUCOSE, CAPILLARY: Glucose-Capillary: 177 mg/dL — ABNORMAL HIGH (ref 70–99)

## 2010-08-21 LAB — COMPREHENSIVE METABOLIC PANEL
ALT: 42 U/L (ref 0–53)
Alkaline Phosphatase: 64 U/L (ref 39–117)
CO2: 25 mEq/L (ref 19–32)
GFR calc non Af Amer: >60 mL/min (ref >60)
Glucose, Bld: 203 mg/dL — ABNORMAL HIGH (ref 70–99)
Potassium: 3.7 mEq/L (ref 3.5–5.1)
Sodium: 136 mEq/L (ref 135–145)

## 2010-08-21 LAB — POCT I-STAT 3, VENOUS BLOOD GAS (G3P V)
Acid-base deficit: 3 mmol/L — ABNORMAL HIGH (ref 0.0–2.0)
Bicarbonate: 24.1 mEq/L — ABNORMAL HIGH (ref 20.0–24.0)
TCO2: 26 mmol/L (ref 0–100)
pO2, Ven: 39 mmHg (ref 30.0–45.0)

## 2010-08-21 LAB — SURGICAL PCR SCREEN: MRSA, PCR: NEGATIVE

## 2010-08-21 LAB — POCT I-STAT 3, ART BLOOD GAS (G3+)
O2 Saturation: 96 %
TCO2: 26 mmol/L (ref 0–100)
pCO2 arterial: 43.1 mmHg (ref 35.0–45.0)
pO2, Arterial: 83 mmHg (ref 80.0–100.0)

## 2010-08-21 LAB — URINALYSIS, ROUTINE W REFLEX MICROSCOPIC
Bilirubin Urine: NEGATIVE
Hgb urine dipstick: NEGATIVE
Nitrite: NEGATIVE
Specific Gravity, Urine: 1.03 (ref 1.005–1.030)
pH: 5.5 (ref 5.0–8.0)

## 2010-08-21 NOTE — Letter (Signed)
Summary: Engineer, materials at [facility]  [address]   [place], [place] [address]   Phone: [phone]  Fax: [phone]        August 11, 2010 MRN: [id number]   [name] [name] [address] [place], [place]  [address]   Dear [name] [name],  Your test ordered by Selena Batten has been reviewed by your physician (or physician assistant) and was found to be normal or stable. Your physician (or physician assistant) felt no changes were needed at this time.  ____ Echocardiogram  ____ Cardiac Stress Test  ____ Lab Work  __X__ Peripheral vascular study of arms, legs or neck - ABI's  ____ CT scan or X-ray  ____ Lung or Breathing test  ____ Other:   Thank you.   Hoover Brunette, LPN    Duane Boston, M.D., F.A.C.C. Thressa Sheller, M.D., F.A.C.C. Oneal Grout, M.D., F.A.C.C. Cheree Ditto, M.D., F.A.C.C. Daiva Nakayama, M.D., F.A.C.C. Kenney Houseman, M.D., F.A.C.C. Jeanne Ivan, PA-C

## 2010-08-21 NOTE — Letter (Signed)
Summary: External Correspondence/ FAXED REFERRAL [facility]  External Correspondence/ FAXED [facility]ERR[facility]   Imported By: Dorise Hiss 08/13/2010 10:08:15  _____________________________________________________________________  External Attachment:    Type:   Image     Comment:   External Document

## 2010-08-22 ENCOUNTER — Encounter: Payer: Self-pay | Admitting: Cardiology

## 2010-08-22 ENCOUNTER — Other Ambulatory Visit: Payer: Self-pay | Admitting: Surgery

## 2010-08-22 ENCOUNTER — Inpatient Hospital Stay (HOSPITAL_COMMUNITY): Payer: BC Managed Care – PPO

## 2010-08-22 DIAGNOSIS — I359 Nonrheumatic aortic valve disorder, unspecified: Secondary | ICD-10-CM

## 2010-08-22 LAB — MAGNESIUM: Magnesium: 2.9 mg/dL — ABNORMAL HIGH (ref 1.5–2.5)

## 2010-08-22 LAB — CBC
HCT: 43.1 % (ref 39.0–52.0)
HCT: 30.4 % — ABNORMAL LOW (ref 39.0–52.0)
Hemoglobin: 11.1 g/dL — ABNORMAL LOW (ref 13.0–17.0)
Hemoglobin: 10.7 g/dL — ABNORMAL LOW (ref 13.0–17.0)
MCH: 32.5 pg (ref 26.0–34.0)
MCH: 31.8 pg (ref 26.0–34.0)
MCH: 32.1 pg (ref 26.0–34.0)
MCHC: 35.5 g/dL (ref 30.0–36.0)
MCHC: 34.8 g/dL (ref 30.0–36.0)
MCHC: 35.2 g/dL (ref 30.0–36.0)
RBC: 3.33 MIL/uL — ABNORMAL LOW (ref 4.22–5.81)
RDW: 12.5 % (ref 11.5–15.5)

## 2010-08-22 LAB — POCT I-STAT 3, ART BLOOD GAS (G3+)
Acid-base deficit: 1 mmol/L (ref 0.0–2.0)
Bicarbonate: 21.4 mEq/L (ref 20.0–24.0)
Bicarbonate: 23.9 mEq/L (ref 20.0–24.0)
O2 Saturation: 96 %
O2 Saturation: 97 %
O2 Saturation: 98 %
O2 Saturation: 98 %
TCO2: 26 mmol/L (ref 0–100)
TCO2: 23 mmol/L (ref 0–100)
TCO2: 25 mmol/L (ref 0–100)
pCO2 arterial: 48.8 mmHg — ABNORMAL HIGH (ref 35.0–45.0)
pCO2 arterial: 37.7 mmHg (ref 35.0–45.0)
pCO2 arterial: 43.2 mmHg (ref 35.0–45.0)
pO2, Arterial: 86 mmHg (ref 80.0–100.0)
pO2, Arterial: 103 mmHg — ABNORMAL HIGH (ref 80.0–100.0)
pO2, Arterial: 103 mmHg — ABNORMAL HIGH (ref 80.0–100.0)
pO2, Arterial: 122 mmHg — ABNORMAL HIGH (ref 80.0–100.0)

## 2010-08-22 LAB — POCT I-STAT, CHEM 8
Chloride: 106 mEq/L (ref 96–112)
Creatinine, Ser: 0.7 mg/dL (ref 0.4–1.5)
Glucose, Bld: 133 mg/dL — ABNORMAL HIGH (ref 70–99)
Potassium: 4.1 mEq/L (ref 3.5–5.1)

## 2010-08-22 LAB — PROTIME-INR: Prothrombin Time: 15.6 seconds — ABNORMAL HIGH (ref 11.6–15.2)

## 2010-08-22 LAB — BASIC METABOLIC PANEL
BUN: 12 mg/dL (ref 6–23)
Calcium: 9.6 mg/dL (ref 8.4–10.5)
GFR calc non Af Amer: >60 mL/min (ref >60)
Potassium: 4.1 mEq/L (ref 3.5–5.1)

## 2010-08-22 LAB — POCT I-STAT 4, (NA,K, GLUC, HGB,HCT)
Glucose, Bld: 133 mg/dL — ABNORMAL HIGH (ref 70–99)
HCT: 33 % — ABNORMAL LOW (ref 39.0–52.0)
Potassium: 3.9 mEq/L (ref 3.5–5.1)
Sodium: 141 mEq/L (ref 135–145)

## 2010-08-22 LAB — GLUCOSE, CAPILLARY
Glucose-Capillary: 118 mg/dL — ABNORMAL HIGH (ref 70–99)
Glucose-Capillary: 132 mg/dL — ABNORMAL HIGH (ref 70–99)

## 2010-08-22 LAB — PLATELET COUNT: Platelets: 142 10*3/uL — ABNORMAL LOW (ref 150–400)

## 2010-08-22 LAB — CREATININE, SERUM: Creatinine, Ser: 0.72 mg/dL (ref 0.4–1.5)

## 2010-08-23 ENCOUNTER — Inpatient Hospital Stay (HOSPITAL_COMMUNITY): Payer: BC Managed Care – PPO

## 2010-08-23 ENCOUNTER — Encounter: Payer: Self-pay | Admitting: Cardiology

## 2010-08-23 DIAGNOSIS — E1165 Type 2 diabetes mellitus with hyperglycemia: Secondary | ICD-10-CM

## 2010-08-23 DIAGNOSIS — IMO0001 Reserved for inherently not codable concepts without codable children: Secondary | ICD-10-CM

## 2010-08-23 LAB — BASIC METABOLIC PANEL
CO2: 24 mEq/L (ref 19–32)
Calcium: 7.5 mg/dL — ABNORMAL LOW (ref 8.4–10.5)
Creatinine, Ser: 0.73 mg/dL (ref 0.4–1.5)
GFR calc Af Amer: >60 mL/min (ref >60)
GFR calc non Af Amer: >60 mL/min (ref >60)
Sodium: 139 mEq/L (ref 135–145)

## 2010-08-23 LAB — GLUCOSE, CAPILLARY
Glucose-Capillary: 143 mg/dL — ABNORMAL HIGH (ref 70–99)
Glucose-Capillary: 134 mg/dL — ABNORMAL HIGH (ref 70–99)
Glucose-Capillary: 136 mg/dL — ABNORMAL HIGH (ref 70–99)

## 2010-08-23 LAB — CBC
Hemoglobin: 9.8 g/dL — ABNORMAL LOW (ref 13.0–17.0)
MCH: 31.9 pg (ref 26.0–34.0)
Platelets: 109 10*3/uL — ABNORMAL LOW (ref 150–400)
RBC: 3.07 MIL/uL — ABNORMAL LOW (ref 4.22–5.81)
WBC: 8.4 10*3/uL (ref 4.0–10.5)

## 2010-08-23 LAB — MAGNESIUM: Magnesium: 2.1 mg/dL (ref 1.5–2.5)

## 2010-08-24 ENCOUNTER — Encounter: Payer: Self-pay | Admitting: Cardiology

## 2010-08-24 ENCOUNTER — Inpatient Hospital Stay (HOSPITAL_COMMUNITY): Payer: BC Managed Care – PPO

## 2010-08-24 LAB — BASIC METABOLIC PANEL
Chloride: 100 mEq/L (ref 96–112)
Creatinine, Ser: 0.89 mg/dL (ref 0.4–1.5)
GFR calc Af Amer: >60 mL/min (ref >60)
GFR calc non Af Amer: >60 mL/min (ref >60)
Potassium: 4.2 mEq/L (ref 3.5–5.1)

## 2010-08-24 LAB — CBC
MCV: 91.4 fL (ref 78.0–100.0)
Platelets: 99 10*3/uL — ABNORMAL LOW (ref 150–400)
RBC: 2.91 MIL/uL — ABNORMAL LOW (ref 4.22–5.81)
WBC: 9.1 10*3/uL (ref 4.0–10.5)

## 2010-08-24 LAB — CROSSMATCH

## 2010-08-24 LAB — GLUCOSE, CAPILLARY
Glucose-Capillary: 143 mg/dL — ABNORMAL HIGH (ref 70–99)
Glucose-Capillary: 146 mg/dL — ABNORMAL HIGH (ref 70–99)
Glucose-Capillary: 149 mg/dL — ABNORMAL HIGH (ref 70–99)
Glucose-Capillary: 208 mg/dL — ABNORMAL HIGH (ref 70–99)

## 2010-08-25 LAB — POCT I-STAT 3, ART BLOOD GAS (G3+)
Acid-Base Excess: 1 mmol/L (ref 0.0–2.0)
Bicarbonate: 26.3 mEq/L — ABNORMAL HIGH (ref 20.0–24.0)
Bicarbonate: 25.5 mEq/L — ABNORMAL HIGH (ref 20.0–24.0)
O2 Saturation: 100 %
TCO2: 28 mmol/L (ref 0–100)
TCO2: 27 mmol/L (ref 0–100)
pCO2 arterial: 45.4 mmHg — ABNORMAL HIGH (ref 35.0–45.0)
pCO2 arterial: 41.2 mmHg (ref 35.0–45.0)
pH, Arterial: 7.355 (ref 7.350–7.450)
pH, Arterial: 7.372 (ref 7.350–7.450)
pH, Arterial: 7.399 (ref 7.350–7.450)

## 2010-08-25 LAB — GLUCOSE, CAPILLARY
Glucose-Capillary: 99 mg/dL (ref 70–99)
Glucose-Capillary: 128 mg/dL — ABNORMAL HIGH (ref 70–99)
Glucose-Capillary: 110 mg/dL — ABNORMAL HIGH (ref 70–99)

## 2010-08-25 LAB — POCT I-STAT 4, (NA,K, GLUC, HGB,HCT)
Glucose, Bld: 143 mg/dL — ABNORMAL HIGH (ref 70–99)
Glucose, Bld: 139 mg/dL — ABNORMAL HIGH (ref 70–99)
Glucose, Bld: 134 mg/dL — ABNORMAL HIGH (ref 70–99)
Glucose, Bld: 163 mg/dL — ABNORMAL HIGH (ref 70–99)
HCT: 39 % (ref 39.0–52.0)
HCT: 37 % — ABNORMAL LOW (ref 39.0–52.0)
HCT: 30 % — ABNORMAL LOW (ref 39.0–52.0)
Hemoglobin: 13.3 g/dL (ref 13.0–17.0)
Hemoglobin: 8.8 g/dL — ABNORMAL LOW (ref 13.0–17.0)
Hemoglobin: 10.2 g/dL — ABNORMAL LOW (ref 13.0–17.0)
Potassium: 4.2 mEq/L (ref 3.5–5.1)
Potassium: 4.1 mEq/L (ref 3.5–5.1)
Potassium: 4.4 mEq/L (ref 3.5–5.1)
Sodium: 139 mEq/L (ref 135–145)

## 2010-08-25 NOTE — Op Note (Signed)
NAME:  [name] [name], [name] [name] NO.:  [id number]  MEDICAL RECORD NO.:  [id number]           PATIENT TYPE:  I  LOCATION:  2308                         FACILITY:  [facility]  PHYSICIAN:  Evelene Croon, M.D.     DATE OF BIRTH:  [birth year]  DATE OF PROCEDURE:  08/22/2010 DATE OF DISCHARGE:                              OPERATIVE REPORT   PREOPERATIVE DIAGNOSIS:  Severe aortic stenosis.  POSTOPERATIVE DIAGNOSIS:  Severe aortic stenosis.  OPERATIVE PROCEDURES: 1. Median sternotomy. 2. Extracorporeal circulation. 3. Aortic valve replacement using a 25-mm Edwards pericardial valve.  ATTENDING SURGEON:  Evelene Croon, MD  ASSISTANT:  Rowe Clack, [facility]  ANESTHESIA:  General endotracheal.  CLINICAL HISTORY:  This patient is a 55 year old gentleman with history of diabetes, hypertension, dyslipidemia, as well as single-vessel coronary disease in the past status post stenting of his mid right coronary artery in November 2010.  He also had a history of known aortic stenosis that has been followed with echocardiogram.  Over the past several months, he has noticed increasing shortness of breath with exertion.  This has occurred with less and less exertion and is now occurring with minimal exertion.  His exercise tolerance has also decreased.  He had some intermittent chest discomfort and pressure over the same period, usually related to exertion.  He underwent a TEE on August 04, 2010, that showed his aortic valve was bicuspid with moderate aortic leaf with calcification.  The aortic valve area was noted to be 0.97 cm2 with an ejection fraction of 60-65% with normal left ventricular function.  There is trace mitral regurgitation and trace tricuspid regurgitation.  He underwent exercise cardiac stress testing on August 05, 2010, and this showed nondiagnostic EKG changes and the patient exercised about 5 minutes before the procedure was terminated due to fatigue and dyspnea.  He  never had any chest pain. After examination of the patient and review of his history, I agree with Dr. Andee Lineman that be best to proceed with aortic valve replacement.  He underwent cardiac catheterization preoperatively which showed normal left ventricular function.  The aortic valve area was 0.8 cm2 with a mean gradient of 31.  Coronary angiography showed about 20% ostial and distal left main tapering.  The LAD was tortuous with no significant disease.  The left circumflex first marginal had about 40-50% stenosis. The right coronary artery had less than 20% distal stenosis.  The stent was widely patent in the mid right coronary artery.  It was felt that he had nonobstructive coronary disease that did not require any coronary bypass surgery.  I had discussed the procedure of aortic valve replacement when I saw him in the office initially.  We discussed the pros and cons of mechanical valves and I have recommended a mechanical valve given his age.  He was adamant that he did not want to be on Coumadin and therefore wanted a tissue valve.  I discussed the likelihood that he would require further valve replaced in the future due to structural valve deterioration given his age and implant if a tissue valve was used.  He said he  understood this, but was still wanted to have a tissue valve.  We discussed all this again last night when I reviewed his cardiac catheterization results with him and his wife. They both said that they had discussed this extensively among themselves and decided the tissue valve would be best for him.  I discussed the benefits and risks of surgery including but not limited to bleeding, blood transfusion, infection, stroke, myocardial infarction, heart block requiring permanent pacemaker, structural valve deterioration in the future requiring further surgery, and death.  He understood all this and agreed to proceed.  OPERATIVE PROCEDURE:  The patient was taken to the  operating room, placed on table in supine position.  After induction of general endotracheal anesthesia, a Foley catheter was placed in the bladder using sterile technique.  Preoperative intravenous antibiotics were given.  Then, the chest, abdomen, and both lower extremities were prepped and draped in usual sterile manner.  A transesophageal echocardiogram showed severe aortic stenosis with a bicuspid aortic valve that was heavily calcified with immobility of the leaflets.  There was no aortic insufficiency.  There was no mitral regurgitation.  Left ventricular function appeared well preserved.  There was no significant left ventricular hypertrophy.  The aortic root and ascending aorta appeared to be fairly normal size.  Right heart function was normal.  Then, the chest was opened through a median sternotomy incision and the pericardium opened in the midline.  Examination of the heart showed good ventricular contractility.  The ascending aorta was of normal size and had no palpable plaques in it.  The aorta was fairly long.  Then, the patient was heparinized when an adequate ACT was obtained. The distal ascending aorta was cannulated using a 20-French aortic cannula for arterial inflow.  Venous outflow was achieved using a 2- stage venous cannula to the right atrial appendage.  An antegrade cardioplegia and vent cannula was inserted in the aortic root.  A left ventricular vent was placed through the right superior pulmonary vein and a retrograde cardioplegic cannula was placed in the coronary sinus through a pursestring suture in the right atrium.  The patient was placed on cardiopulmonary bypass.  The aorta was then crossclamped and 800 mL of cold blood antegrade cardioplegia was administered in the aortic root with quick arrest of the heart. Systemic hypothermia to 32 degrees centigrade and topical hypothermic iced saline was used.  A temperature probe placed in the septum and  an insulating pad in the pericardium.  Additional doses of cold blood retrograde cardioplegia were given at about 20-minute intervals to maintain myocardial temperature around 10 degrees centigrade or less.  The aorta was then opened transversely about 1.5 cm above the sinus tubular junction.  Examination of the native valve showed there was a bicuspid valve with fusion of the right and noncoronary cusps to form a single cusp.  There were 3 commissures present.  The leaflets were heavily calcified and very thick and immobile.  There was minimal annular calcification.  The right and left coronary ostia were identified.  There is very slight narrowing of the left coronary ostium as noted on angiogram.  This is not felt to be significant.  The right coronary ostium was widely patent.  Then, the native aortic valve was excised.  The annulus was decalcified with rongeurs.  Care was taken to remove all particulate debris.  The left ventricle and aortic root were irrigated with iced saline solution. Then, the annulus was sized and a 25-mm Edwards pericardial Magna-Ease valve  was chosen.  This had model #3300TFX, serial [id number].  Then, a series of pledgeted 2-0 Ethibond horizontal mattress sutures were placed around the aortic annulus with pledgets in the subannular position. These sutures were placed through the sewing ring of the valve and the valve lowered into place.  The sutures were tied sequentially.  The valve seated nicely.  The coronary ostia were not obstructed.  The patient was then rewarmed to 37 degrees centigrade.  The aortotomy was closed in two-layer using continuous 4-0 Prolene suture.  The aortotomy was lightly covered with CoSeal for hemostasis.  We did use CO2 within the pericardial cavity throughout the procedure to try to minimize intracardiac air.  Then, the left side of the heart was de-aired as much as possible and head placed in the Trendelenburg position.   The crossclamp was removed with a time of 60 minutes.  There is spontaneous return of ventricular fibrillation.  The patient was defibrillated into sinus rhythm.  Then, 2 temporary right ventricular and right atrial pacing wires were placed above through the skin.  When the patient rewarmed to 37 degrees centigrade, the left ventricular vent and retrograde cardioplegic cannulas were removed.  The patient was then weaned from cardiopulmonary bypass on no inotropic agents.  Total bypass time was 80 minutes.  Cardiac function appeared excellent with a cardiac output of 5.5 liters minute.  Transesophageal echocardiogram was performed which showed normal functioning prosthetic aortic valve. There was no evidence of perivalvular leak or regurgitation through the valve.  There is no mitral regurgitation.  Left ventricular function appeared well preserved.  Then, protamine was given and the venous and aortic cannulae were removed without difficulty.  Hemostasis was achieved.  Three chest tubes were placed with two in the posterior pericardium, one in the anterior mediastinum.  Sternum was then closed with double #6 stainless steel wires.  The fascia was closed with continuous #1 Vicryl suture.  Subcutaneous tissue was closed with continuous 2-0 Vicryl and skin with 3-0 Vicryl subcuticular closure. The sponge, needle, and instrument counts were correct according to the scrub nurse.  Dry sterile dressing was applied over the incisions and around the chest tubes which were Pleur-Evac suction.  The patient remained hemodynamically stable and was transferred to the SICU in guarded but stable condition.     Evelene Croon, M.D.     BB/MEDQ  D:  08/22/2010  T:  08/23/2010  Job:  [id number]  cc:   Learta Codding, MD,FACC  Electronically Signed by Evelene Croon M.D. on 08/25/2010 01:33:06 PM

## 2010-08-26 ENCOUNTER — Encounter: Payer: Self-pay | Admitting: Cardiology

## 2010-08-26 DIAGNOSIS — I359 Nonrheumatic aortic valve disorder, unspecified: Secondary | ICD-10-CM

## 2010-08-26 LAB — GLUCOSE, CAPILLARY: Glucose-Capillary: 107 mg/dL — ABNORMAL HIGH (ref 70–99)

## 2010-08-27 NOTE — Letter (Signed)
Summary: External Correspondence/ NEW PATIENT CONSULTATION DR. BARTLE  External Correspondence/ NEW PATIENT CONSULTATION DR. BARTLE   Imported By: Dorise Hiss 08/20/2010 15:15:04  _____________________________________________________________________  External Attachment:    Type:   Image     Comment:   External Document

## 2010-08-27 NOTE — Letter (Signed)
Summary: Engineer, materials at [facility]  [address]   [place], [place] [address]   Phone: [phone]  Fax: [phone]        August 21, 2010 MRN: [id number]   JAMEON DELLER [address] [place], [place]  [address]   Dear Mr. Swindler,  Your test ordered by Selena Batten has been reviewed by your physician (or physician assistant) and was found to be normal or stable. Your physician (or physician assistant) felt no changes were needed at this time.  ____ Echocardiogram  ____ Cardiac Stress Test  __X_ Lab Work  ____ Peripheral vascular study of arms, legs or neck  _X___ Chest  X-ray  ____ Lung or Breathing test  ____ Other:   Thank you.   Laray Anger, M.D., F.A.C.C.

## 2010-08-27 NOTE — Progress Notes (Signed)
Summary: NEED CATH COORDINATED  Phone Note From Other Clinic Call back at [phone]   Caller: Dee @Dr . Bartle office Call For: nurse Summary of Call: message left on nurse voicemail that patient is going to be having aortic valve placement surgery and was needing to have a cath prior to surgery and needed our office to coordinate the cath. also said patient was on plavix and it needed to be stopped 5 days prior to surgery. Initial call taken by: Carlye Grippe,  August 13, 2010 4:55 PM  Follow-up for Phone Call        Okay per Dr. Andee Lineman - hold Plavix 5 days prior to surgery.  Need to be left & right hear cath.   Hoover Brunette, LPN  August 15, 2010 10:43 AM   Patient's son instructed to have pt hold Plavix starting on 2/11 for pending cath & valve replacement surgery.   Follow-up by: Hoover Brunette, LPN,  August 15, 2010 10:45 AM  Additional Follow-up for Phone Call Additional follow up Details #1::        [facility] with Dr. Sharee Pimple office notified of cath scheduled for 2/16 at 8:30 a.m.   Additional Follow-up by: Hoover Brunette, LPN,  August 18, 2010 11:38 AM

## 2010-08-27 NOTE — Miscellaneous (Signed)
Summary: Orders Update - cardiac catherization  Clinical Lists Changes  Problems: Added new problem of OTH NONSPECIFIC ABNORM CV SYSTEM FUNCTION STUDY (ICD-794.39) Orders: Added new Test order of T-Basic Metabolic Panel [phone]) - Signed Added new Test order of T-CBC No Diff ([id number]) - Signed Added new Test order of T-Protime, Auto ([id number]) - Signed Added new Test order of T-PTT ([id number]) - Signed Added new Test order of T-Chest x-ray, 2 views (71020) - Signed Added new Referral order of Cardiac Catheterization (Cardiac Cath) - Signed

## 2010-08-27 NOTE — Letter (Signed)
Summary: [facility]: New Pt Consultation  [facility]: New Pt Consultation   Imported By: Earl Many 08/19/2010 17:00:04  _____________________________________________________________________  External Attachment:    Type:   Image     Comment:   External Document

## 2010-08-27 NOTE — Letter (Signed)
Summary: External Correspondence/ OFFICE NOTE [facility]  External Correspondence/ OFFICE NOTE [facility]   Imported By: Dorise Hiss 08/20/2010 15:24:22  _____________________________________________________________________  External Attachment:    Type:   Image     Comment:   External Document

## 2010-08-27 NOTE — Letter (Signed)
Summary: Cardiac Cath Instructions - [facility]  [facility] [address]   [place], [place] [address]   Phone: [phone]  Fax: [phone]     08/18/2010 MRN: [id number]  [name] [name] [address] [place], [place]  [address]  [place]  Dear Mr. Fink,   You are scheduled for a Cardiac Catheterization on Thursday, February 16 at 8:30 a.m. with Dr. [place].   Please arrive to the 1st floor of the Heart and Vascular Center at [facility] at 7:30 am on the day of your procedure. Please do not arrive before 6:30 a.m. Call the Heart and Vascular Center at [phone] if you are unable to make your appointmnet. The Code to get into the parking garage under the building is 0300. Take the elevators to the 1st floor. You must have someone to drive you home. Someone must be with you for the first 24 hours after you arrive home. Please wear clothes that are easy to get on and off and wear slip-on shoes. Do not eat or drink after midnight except water with your medications that morning. Bring all your medications and current insurance cards with you.  _X__ DO NOT take these medications before your procedure:  Glipizide - morning of cath only & Metformin - 24 hours before and 48 hours after  _X__ Make sure you take your aspirin.  ___ You may take ALL of your medications with water that morning.  ___ DO NOT take ANY medications before your procedure.  ___ Pre-med instructions:  ________________________________________________________________________  The usual length of stay after your procedure is 2 to 3 hours. This can vary.  If you have any questions, please call the office at the number listed above.  Hoover Brunette, LPN                     Directions to the [facility]  Please Note : Park in [place] under the building not the parking deck.  From [employer]: Turn onto [address] Left onto [address]  (1st stoplight) Right at the brick entrance to the hospital ([address] circle [address]) Bear to the right and you will see a blue sign "[facility]" Parking garage is a sharp right'to get through the gate out in the code _______. Once you park, take the elevator to the first floor. Please do not arrive before 0630am. The building will be dark before that time.   From [address] Turn onto [address] Turn left into the brick entrance to the hospital (Main circle drive) [place] to the right and you will see a blue sign "[facility]" Parking garage is a sharp right, to get thru the gate put in the code ____. Once you park, take the elevator to the first floor. Please do not arrive before 0630am. The building will be dark before that time

## 2010-09-02 NOTE — Letter (Signed)
Summary: Internal Correspondence/ FAXED PRE-CATH ORDER  Internal Correspondence/ FAXED PRE-CATH ORDER   Imported By: Dorise Hiss 08/26/2010 09:54:03  _____________________________________________________________________  External Attachment:    Type:   Image     Comment:   External Document

## 2010-09-03 NOTE — Procedures (Signed)
  NAME:  [name] [name], [name] [name] NO.:  [id number]  MEDICAL RECORD NO.:  [id number]           PATIENT TYPE:  I  LOCATION:  2005                         FACILITY:  [facility]  PHYSICIAN:  Peter M. Swaziland, M.D.  DATE OF BIRTH:  1955/08/28  DATE OF PROCEDURE:  08/21/2010 DATE OF DISCHARGE:                           CARDIAC CATHETERIZATION   INDICATIONS FOR PROCEDURE:  A 55 year old white male presents with symptomatic severe aortic stenosis.  He has a bicuspid valve by transesophageal echo.  He has had prior stenting of the right coronary artery.  PROCEDURE:  Right and left heart catheterization coronary and left ventricular angiography.  ACCESS:  Via the right femoral artery and vein using a standard Seldinger technique.  EQUIPMENT:  4-French left Amplatz 1 catheter, 4-cm left Judkins catheter, 4-French pigtail catheter, 4-French arterial sheath, 7-French venous sheath, 7-French balloon-tip Swan-Ganz catheter.  CONTRAST:  100 mL of Omnipaque.  MEDICATIONS:  Local anesthesia 1% Xylocaine, Versed 2 mg IV.  HEMODYNAMIC DATA:  Right atrial pressures 7/5 with a mean of 4 mmHg, right ventricular pressure is 24 with an EDP of 7 mmHg.  Pulmonary artery pressure is 20/7 with a mean of 13 mmHg.  Pulmonary capillary wedge pressure is 10/7 with a mean of 6 mmHg.  Aortic pressure is 118/67 with a mean of 88 mmHg.  Left ventricular pressure is 161 with an EDP of 12 mmHg.  There is no significant mitral valve gradient.  The aortic valve mean gradient is 31 mmHg with calculated valve area of 0.8 centimeter squared.  Thermodilution cardiac output is 4.4 L per minute with an index of 2, Fick cardiac outputs 4.7 L per minute with an index of 2.2.  ANGIOGRAPHIC DATA:  The left ventricular angiography was performed in a RAO view.  This demonstrates normal left ventricular size and contractility with normal systolic function.  Ejection fraction is estimated at 65%.  The aortic valve is  calcified with eccentric opening and restrictive mobility.  The left coronary artery arises and distributes normally.  The left main coronary has 20% narrowing in the ostium and in the distal left main.  The left anterior descending artery is a tortuous vessel which has less than 10% irregularities.  Left circumflex coronary artery is rise to first obtuse marginal vessel which has a 40-50% stenosis proximally.  The right coronary artery is a dominant vessel, it has 20% disease in the distal vessel.  The stent in the midvessel was widely patent.  FINAL INTERPRETATION: 1. Nonobstructive atherosclerotic coronary artery disease. 2. Normal left ventricular function. 3. Severe aortic stenosis. 4. Normal right heart pressures.  PLAN:  Proceed with aortic valve replacement.          ______________________________ Peter M. Swaziland, M.D.     PMJ/MEDQ  D:  08/21/2010  T:  08/21/2010  Job:  [id number]  cc:   Learta Codding, MD,FACC Ernestina Penna, M.D. Evelene Croon, M.D.  Electronically Signed by PETER Swaziland M.D. on 09/03/2010 04:51:12 PM

## 2010-09-08 ENCOUNTER — Encounter: Payer: Self-pay | Admitting: Cardiology

## 2010-09-09 ENCOUNTER — Encounter: Payer: Self-pay | Admitting: Cardiology

## 2010-09-09 ENCOUNTER — Encounter: Payer: Self-pay | Admitting: *Deleted

## 2010-09-09 DIAGNOSIS — R55 Syncope and collapse: Secondary | ICD-10-CM

## 2010-09-10 DIAGNOSIS — I498 Other specified cardiac arrhythmias: Secondary | ICD-10-CM

## 2010-09-11 ENCOUNTER — Encounter: Payer: Self-pay | Admitting: Cardiology

## 2010-09-11 DIAGNOSIS — R55 Syncope and collapse: Secondary | ICD-10-CM | POA: Insufficient documentation

## 2010-09-11 NOTE — Discharge Summary (Signed)
NAME:  [name] [name], [name] [name] NO.:  [id number]  MEDICAL RECORD NO.:  [id number]           PATIENT TYPE:  I  LOCATION:  2028                         FACILITY:  [facility]  PHYSICIAN:  [name] [name], M.D.     DATE OF BIRTH:  September 29, 1955  DATE OF ADMISSION:  08/21/2010 DATE OF DISCHARGE:  08/26/2010                              DISCHARGE SUMMARY   PRIMARY ADMITTING DIAGNOSIS:  Severe aortic stenosis.  ADDITIONAL/DISCHARGE DIAGNOSES: 1. Severe aortic stenosis. 2. Postoperative atrial fibrillation. 3. Type 2 diabetes mellitus. 4. Hypertension. 5. Obstructive sleep apnea. 6. History of coronary artery disease, status post stenting of the     right coronary artery.  PROCEDURES PERFORMED:  Aortic valve replacement with 25 Magna Ease pericardial tissue valve.  HISTORY:  The patient is a 55 year old male with known history of single- vessel coronary artery disease, status post stenting in the mid right coronary artery in November 2010.  He also has a known history of aortic stenosis and has been followed with serial echocardiograms.  Over the past several months, he has had worsening shortness of breath with exertion.  This has begun to occur with less and less exertion, and now to the point where he has shortness of breath with very minimal exertion.  He has had some intermittent chest discomfort associated with exertion as well.  He underwent a recent transesophageal echocardiogram on August 04, 2010, and this showed his aortic valve to be bicuspid with moderate aortic leaflet calcification.  There was fusion in the right noncoronary cusps.  Aortic valve area measured by planimetry was 0.97 cm2 with ejection fraction of 60-65%.  There was trace mitral regurgitation and trace tricuspid regurgitation.  Cardiac exercise stress test on January 31 showed nondiagnostic EKG changes.  He was subsequently referred to Dr. Evelene [name] for consideration of surgical intervention to his  aortic valve.  Dr. Laneta [name] reviewed his films and agreed with the need for aortic valve replacement in light of his worsening symptoms.  He did undergo cardiac catheterization prior to surgery by Dr. Peter [name], which showed nonobstructive coronary artery disease, normal left ventricular function, normal right heart pressures, and severe aortic stenosis.  All risks, benefits, and alternatives of surgery were explained to the patient, and he agreed to proceed with surgery.  Also, Dr. Laneta [name] discussed the pros and cons of both mechanical and tissue valves, and the patient was adamant that he did not want to be on Coumadin and would like to have a tissue valve.  He did understand that the risk of deterioration of the valve over the next 15 years was high and may result in replacement of the valve at some point, and the patient did understand this issue and wanted to proceed.  HOSPITAL COURSE:  Mr. [name] [name] was admitted to [facility] on the date of admission for cardiac catheterization.  He was then taken to the operating room on August 22, 2010, where he underwent aortic valve replacement as described above.  Please see previously dictated operative report for complete details of surgery.  He tolerated the procedure well and was transferred to  the SICU in stable condition.  He was extubated shortly after surgery.  He was hemodynamically stable and doing well on postop day #1.  At that time, his chest tubes and hemodynamic monitoring lines were removed and he was able to be transferred to the Step-Down Unit.  Overall, his postoperative course has been relatively stable.  He did develop atrial fibrillation, for which he was treated with amiodarone.  He did convert to normal sinus rhythm and has been switched to p.o. amiodarone.  He has been restarted on his home diabetes medications and his blood sugars have remained stable.  He is tolerating a regular diet.  He is ambulating with  cardiac rehab phase 1, is progressing well.  He has remained afebrile and his vital signs have been stable.  His most recent labs showed hemoglobin of 9.3, hematocrit 26.6, platelets 99,000, white count 9.1.  Sodium 138, potassium 4.2, BUN 22, creatinine 0.89.  His most recent chest x-ray shows stable atelectasis with trace bilateral effusions.  It is felt that since he has remained stable and is otherwise doing well, he is ready for discharge home at this time.  DISCHARGE MEDICATIONS: 1. Plavix 75 mg daily. 2. Amiodarone 200 mg 2 tablets b.i.d. x 14 days and 1 tablet b.i.d.     thereafter. 3. Lasix 40 mg daily x5 days. 4. Lopressor 12.5 mg b.i.d. 5. Oxycodone IR 5-10 mg q.3 h. p.r.n. for pain. 6. Potassium 20 mEq 2 tablets daily x5 days. 7. Enteric-coated aspirin 81 mg daily. 8. Glipizide 5 mg daily. 9. Lipitor 40 mg daily. 10.Metformin 500 mg daily. 11.Niacin 500 mg daily. 12.Sublingual nitroglycerin p.r.n. for chest pain. 13.Vitamin D 1000 units daily.  DISCHARGE INSTRUCTIONS:  He is asked to refrain from driving, heavy lifting, or strenuous activity.  He may continue ambulating daily and using his incentive spirometer.  He may shower daily and clean his incisions with soap and water.  He will continue same preoperative diet.  DISCHARGE FOLLOWUP:  He will need to make an appointment to see Dr. Andee [name] in 2 weeks.  He will then follow up with Dr. Laneta [name] in 3 weeks with a chest x-ray.  In the interim, if he experiences any problems or has questions, he is asked to contact our office immediately.     [name] [name], P.A.   ______________________________ [name] [name], M.D.    GC/MEDQ  D:  08/26/2010  T:  08/26/2010  Job:  [id number]  cc:   [name] Codding, MD,FACC  Electronically Signed by [name] [name]. on 09/10/2010 09:46:23 AM Electronically Signed by [name] [name] M.D. on 09/10/2010 11:31:47 AM

## 2010-09-13 ENCOUNTER — Encounter: Payer: Self-pay | Admitting: Cardiology

## 2010-09-15 ENCOUNTER — Encounter: Payer: Self-pay | Admitting: Cardiology

## 2010-09-15 ENCOUNTER — Ambulatory Visit (INDEPENDENT_AMBULATORY_CARE_PROVIDER_SITE_OTHER): Payer: BC Managed Care – PPO | Admitting: Cardiology

## 2010-09-15 ENCOUNTER — Other Ambulatory Visit: Payer: Self-pay | Admitting: Surgery

## 2010-09-15 DIAGNOSIS — I4891 Unspecified atrial fibrillation: Secondary | ICD-10-CM

## 2010-09-15 DIAGNOSIS — G4700 Insomnia, unspecified: Secondary | ICD-10-CM | POA: Insufficient documentation

## 2010-09-15 DIAGNOSIS — I951 Orthostatic hypotension: Secondary | ICD-10-CM | POA: Insufficient documentation

## 2010-09-15 DIAGNOSIS — I359 Nonrheumatic aortic valve disorder, unspecified: Secondary | ICD-10-CM

## 2010-09-15 DIAGNOSIS — I482 Chronic atrial fibrillation, unspecified: Secondary | ICD-10-CM | POA: Insufficient documentation

## 2010-09-15 DIAGNOSIS — I059 Rheumatic mitral valve disease, unspecified: Secondary | ICD-10-CM

## 2010-09-15 DIAGNOSIS — I319 Disease of pericardium, unspecified: Secondary | ICD-10-CM | POA: Insufficient documentation

## 2010-09-16 ENCOUNTER — Ambulatory Visit (INDEPENDENT_AMBULATORY_CARE_PROVIDER_SITE_OTHER): Payer: Self-pay | Admitting: Surgery

## 2010-09-16 ENCOUNTER — Encounter: Payer: Self-pay | Admitting: Cardiology

## 2010-09-16 ENCOUNTER — Ambulatory Visit
Admission: RE | Admit: 2010-09-16 | Discharge: 2010-09-16 | Disposition: A | Discharge status: Home / Self Care | Payer: BC Managed Care – PPO | Source: Ambulatory Visit | Attending: Surgery | Admitting: Surgery

## 2010-09-16 DIAGNOSIS — I359 Nonrheumatic aortic valve disorder, unspecified: Secondary | ICD-10-CM

## 2010-09-16 NOTE — Miscellaneous (Signed)
Summary: Orders Update - Cardionet  Clinical Lists Changes  Problems: Added new problem of SYNCOPE (ICD-780.2) Orders: Added new Referral order of Cardionet/Event Monitor (Cardionet/Event) - Signed

## 2010-09-17 NOTE — Assessment & Plan Note (Signed)
OFFICE VISIT  [name] [name], [name] [name] DOB:  02/12/1956                                        September 16, 2010 CHART #:  [id number]  The patient returned to my office today for followup status post aortic valve replacement with a 25-mm Edwards pericardial valve on August 22, 2010.  He had a history of prior stenting of his mid right coronary artery in November of 2010, and that was still widely patent on angiogram.  Therefore, no coronary bypass was performed. Postoperatively, he did have some atrial fibrillation and was started on amiodarone with conversion to sinus rhythm.  He was sent home on amiodarone and Lopressor.  He said that he developed syncope one evening and was seen in [facility] where he was noted to have heart rate in the 30-40 range.  He had orthostatic hypotension.  His Lopressor was discontinued.  Echocardiogram showed normal functioning aortic valve prosthesis with ejection fraction of 60-65%.  There was reportedly a mean gradient of 50 mmHg across his aortic valve.  The accuracy of this is questionable since he has a 25-mm aortic valve prosthesis which is a fairly large size valve.  There was small-to-moderate-sized posterior pericardial effusion without evidence of tamponade.  Since his Lopressor was discontinued, he said he felt well.  His only real complaint is difficulty sleeping at night and he has noticed some hypertension at night which he feels is related to not being able to fall asleep.  He was recently started on lisinopril and temazepam.  He has continued to walk daily without chest pain or shortness of breath.  On physical examination today, his blood pressure 144/78, pulse is 72 and regular, respiratory rate 16 unlabored.  Oxygen saturation on room air is 96%.  He looks well.  Cardiac exam shows regular rate and rhythm with normal valve sounds.  His lungs are clear.  His chest incision is healing well and his sternum is  stable.  There is no peripheral edema.  Follow up chest x-ray shows clear lung fields and no pleural effusions.  His medications are: 1. Aspirin 81 mg daily. 2. Plavix 75 mg daily. 3. Glipizide 5 mg daily. 4. Metformin 500 mg daily. 5. Amiodarone 200 mg daily. 6. Temazepam 50 mg bedtime for sleep. 7. Lisinopril 5 mg daily. 8. Oxycodone p.r.n. for pain. 9. Vitamin D3 1000 units daily. 10.Niaspan 500 mg daily. 11.Voltaren 1% gel daily.  IMPRESSION:  Overall, the patient is making good recovery following his surgery.  He has not had any further syncope since his Lopressor was discontinued.  He said that he was told to continue his amiodarone until the end of March, and then discontinue which should be fine.  He did not want to be on Coumadin, therefore, a tissue valve was used.  He is being continued on aspirin and Plavix with a history of a right coronary stent.  I told him he could return driving a car, but should refrain from lifting anything heavier than 10 pounds for total of 3 months from date of surgery.  He will continue to follow up with Dr. Andee Lineman, and will contact me if he develops any problems with his incision.  Evelene Croon, M.D. Electronically Signed  BB/MEDQ  D:  09/16/2010  T:  09/17/2010  Job:  [id number]  cc:   Learta Codding, MD,FACC

## 2010-09-18 ENCOUNTER — Encounter: Payer: Self-pay | Admitting: Cardiology

## 2010-09-23 NOTE — Letter (Signed)
Summary: Discharge Summary/  DR. PARSON  Discharge Summary/  DR. PARSON   Imported By: Dorise Hiss 09/15/2010 12:43:27  _____________________________________________________________________  External Attachment:    Type:   Image     Comment:   External Document

## 2010-09-23 NOTE — Miscellaneous (Signed)
Summary: Rehab Report/  FAXED CARDIAC REHAB REFERRAL  Rehab Report/  FAXED CARDIAC REHAB REFERRAL   Imported By: Dorise Hiss 09/17/2010 14:39:23  _____________________________________________________________________  External Attachment:    Type:   Image     Comment:   External Document

## 2010-09-23 NOTE — Consult Note (Signed)
Summary: Consultation Report/  PROGRESS NOTE  Consultation Report/  PROGRESS NOTE   Imported By: Dorise Hiss 09/15/2010 16:15:03  _____________________________________________________________________  External Attachment:    Type:   Image     Comment:   External Document

## 2010-09-23 NOTE — Miscellaneous (Signed)
Summary: Rehab Report/  CARDIAC REHAB PROGRESS REPORT  Rehab Report/  CARDIAC REHAB PROGRESS REPORT   Imported By: Dorise Hiss 09/18/2010 16:27:04  _____________________________________________________________________  External Attachment:    Type:   Image     Comment:   External Document

## 2010-09-23 NOTE — Op Note (Signed)
Summary: Operative Report/  INTRAOPERSTIVE TRANSESOPHAGEAL ECHO  Operative Report/  INTRAOPERSTIVE TRANSESOPHAGEAL ECHO   Imported By: Dorise Hiss 09/15/2010 12:23:16  _____________________________________________________________________  External Attachment:    Type:   Image     Comment:   External Document

## 2010-09-23 NOTE — Assessment & Plan Note (Signed)
Summary: post op CABG [facility] vs   Visit Type:  Follow-up Primary Provider:  Rudi Heap   History of Present Illness: The patient is a 55 year old male with a history of severe aortic stenosis with bicuspid aortic valve.  The patient is status-post aortic valve replacement with pericardial tissue valve performed on August 22, 2010 by Dr. Lavinia Sharps.  The patient apparently was very adamant that he did not want Coumadin and therefore received a pericardial valve.  He developed postoperative atrial fibrillation and was on amiodarone without anticoagulation. The patient does have a history of coronary artery disease and is status post RCA stenting 2010.  Recent catheterization from August 21, 2010 showed no obstructive coronary artery disease.  His ejection fraction was 60 to 65% by TEE intraoperatively with annular mitral calcification trace tricuspid regurgitation. The patient's cardiac risk factors include hypertension diabetes mellitus and obstructive sleep apnea. The patient presented with syncope to [facility] which was felt to be secondary to orthostatic hypotension.  Metoprolol was discontinued.  Echocardiogram was performed which showed an ejection fraction of 60 to 65% with a normal bioprosthetic aortic valve that appears to be functioning normally.  The mean gradient across the aortic valve was 50 mm of mercury.  There was a small to moderate size posterior pericardial effusion without evidence tamponade.  The patient now presents for follow-up after his recent hospitalization. The patient states that he had no recurrent syncope or dizziness.  From cardiac standpoint his been doing well.  However he has been to the emergency room because of elevated blood pressure.  This occurs in the setting of some anxiety during the nighttime and the patient could not sleep.  Indeed, he reports significant insomnia since his bypass surgery and typically gets less than two hours of sleep at  night.  He states that once he wakes up is unable to fall asleep and has ruminating thoughts.  When he checks his blood pressure is markedly elevated.  In the daytime however his blood pressures are running more normal although his systolic still remains somewhat elevated. The patient is questioning also what he can start cardiac rehab.   Preventive Screening-Counseling & Management  Alcohol-Tobacco     Smoking Status: quit     Year Quit: 2004  Current Medications (verified): 1)  Lipitor 40 Mg Tabs (Atorvastatin Calcium) .... Take 1 Tablet By Mouth Once A Day 2)  Aspirin 81 Mg Tbec (Aspirin) .... Take 1 Tablet By Mouth Once A Day 3)  Nitrostat 0.4 Mg Subl (Nitroglycerin) .[name].. 1 Tablet Under Tongue At Onset of Chest Pain; You May Repeat Every 5 Minutes For Up To 3 Doses. 4)  Plavix 75 Mg Tabs (Clopidogrel Bisulfate) .... Take 1 Tablet By Mouth Once A Day 5)  Glipizide 5 Mg Tabs (Glipizide) .... Take 1 Tablet By Mouth Once A Day 6)  Vitamin D 1000 Unit Tabs (Cholecalciferol) .... Take 1 Tablet By Mouth Once A Day 7)  Niaspan 500 Mg Cr-Tabs (Niacin (Antihyperlipidemic)) .... Take 1 Tablet By Mouth Once A Day 8)  Metformin Hcl 500 Mg Tabs (Metformin Hcl) .... Take 1 Tablet By Mouth Daily 9)  Amiodarone Hcl 200 Mg Tabs (Amiodarone Hcl) .... Take 1 Tablet By Mouth Once A Day 10)  Oxycodone Hcl 5 Mg Tabs (Oxycodone Hcl) .... As Needed  Allergies (verified): 1)  ! Sulfa  Comments:  Nurse/Medical Assistant: The patient's medication list and allergies were reviewed with the patient and were updated in the Medication and Allergy Lists.  Past History:  Past Medical History: Last updated: 05/24/2009 Substernal Chest pains/acute coronary syndrome, typical features. Diabetes Mellitus  type II Hypertension Dyslipidemia Hx of Obstructive sleep apnea  Mild Hypokalemia       Past Surgical History: Last updated: 05/24/2009  1.  Left heart catheterization.  2.  Selective coronary  angiography.  3.  Left ventriculography.  4.     Release of right transverse carpal ligament.  Family History: Last updated: 05/24/2009 Strong family history of CAD in both father and mother Mother had bypass surgery in her 66's Father had BiPap surgery at later age in his 30's  Social History: Last updated: 05/27/2009 Tobacco Use - No.   Risk Factors: Smoking Status: quit (09/15/2010)  Vital Signs:  Patient profile:   55 year old male Height:      70 inches Weight:      207 pounds Pulse rate:   69 / minute BP sitting:   154 / 81  (left arm) Cuff size:   large  Vitals Entered By: Carlye Grippe (September 15, 2010 1:07 PM)  Physical Exam  Additional Exam:  General: Well-developed, well-nourished in no distress head: Normocephalic and atraumatic eyes PERRLA/EOMI intact, conjunctiva and lids normal nose: No deformity or lesions mouth normal dentition, normal posterior pharynx neck: Supple, no JVD.  No masses, thyromegaly or abnormal cervical nodes, pulsus parvus et tardus lungs: Normal breath sounds bilaterally without wheezing.  Normal percussion heart: Rectal rate and rhythm with normal S1 and S2, no S3 or S4.  PMI is normal. there is a 4/6 crescendo decrescendo murmur at the right upper sternal border.  Murmurs consistent with aortic stenosis. abdomen: Normal bowel sounds, abdomen is soft and nontender without masses, organomegaly or hernias noted.  No hepatosplenomegaly musculoskeletal: Back normal, normal gait muscle strength and tone normal pulsus:1+ distal pulses in dorsalis pedis and posterior tibial pulse of the left leg 2+ in the right extremity Extremities: No peripheral pitting edema neurologic: Alert and oriented x 3 skin: Intact without lesions or rashes cervical nodes: No significant adenopathy psychologic: Normal affect    Impression & Recommendations:  Problem # 1:  AORTIC VALVE DISORDERS (ICD-424.1) status-post aortic valve replacement with pericardial  tissue valve: Normal function as assessed by recent echocardiogram.  The patient has a follow-up appointment Dr. Lavinia Sharps tomorrow. The following medications were removed from the medication list:    Lotrel 10-20 Mg Caps (Amlodipine besy-benazepril hcl) .[name]... Take 1 tablet by mouth once a day His updated medication list for this problem includes:    Nitrostat 0.4 Mg Subl (Nitroglycerin) .[name]... 1 tablet under tongue at onset of chest pain; you may repeat every 5 minutes for up to 3 doses.    Lisinopril 5 Mg Tabs (Lisinopril) .[name]... Take one tablet by mouth daily  Orders: EKG w/ Interpretation (93000)  Problem # 2:  ATRIAL FIBRILLATION, PAROXYSMAL, HX OF (ICD-V12.50) postoperative atrial fibrillation: On amiodarone.  Electrocardiogram reviewed patient is in normal sinus rhythm.  Amiodarone can be discontinued in approximately 2 weeks.  Problem # 3:  CORONARY ARTERY DISEASE, S/P PTCA (ICD-414.9) normal ejection fraction coronary artery disease status post stent of the RCA 2010.  No bypass grafting performed during current open-heart surgery.  Patent arteries by preoperative catheterization  The following medications were removed from the medication list:    Lotrel 10-20 Mg Caps (Amlodipine besy-benazepril hcl) .[name]... Take 1 tablet by mouth once a day His updated medication list for this problem includes:    Aspirin 81 Mg Tbec (Aspirin) .Marland KitchenMarland KitchenMarland KitchenMarland Kitchen  Take 1 tablet by mouth once a day    Nitrostat 0.4 Mg Subl (Nitroglycerin) .[name]... 1 tablet under tongue at onset of chest pain; you may repeat every 5 minutes for up to 3 doses.    Plavix 75 Mg Tabs (Clopidogrel bisulfate) .[name]... Take 1 tablet by mouth once a day    Lisinopril 5 Mg Tabs (Lisinopril) .[name]... Take one tablet by mouth daily  Problem # 4:  POSTURAL HYPOTENSION (ICD-458.0) Status post orthostatic hypotension with syncope: Resolved after discontinuation of Toprol.  Problem # 5:  HYPERTENSION (ICD-401.9) hypertension: Blood pressure poorly controlled.   Will add lisinopril for her daily. The following medications were removed from the medication list:    Lotrel 10-20 Mg Caps (Amlodipine besy-benazepril hcl) .[name]... Take 1 tablet by mouth once a day His updated medication list for this problem includes:    Aspirin 81 Mg Tbec (Aspirin) .[name]... Take 1 tablet by mouth once a day    Lisinopril 5 Mg Tabs (Lisinopril) .[name]... Take one tablet by mouth daily  Orders: EKG w/ Interpretation (93000)  Problem # 6:  INSOMNIA (ICD-780.52) Insomnia: Given the patient a prescription of temazepam 50 mg p.o. nightly.  Patient Instructions: 1)  Follow up with Dr. Earnestine Leys on  [facility], December 17, 2010 AT 1:15PM. 2)  Restoril (temazepam) 15mg  by mouth at bedtime. 3)  Lisinopril 5mg  by mouth once daily. 4)  Stop Amiodarone on April 1st. Prescriptions: LISINOPRIL 5 MG TABS (LISINOPRIL) Take one tablet by mouth daily  #30 x 6   Entered by:   Cyril Loosen, RN, BSN   Authorized by:   Lewayne Bunting, MD, [facility]   Signed by:   Cyril Loosen, RN, BSN on 09/15/2010   Method used:   Electronically to        [facility]* (retail)       [address]       [place], [place]  [address]       Ph: [phone]       Fax: [phone]   RxID:   [id number] TEMAZEPAM 15 MG CAPS (TEMAZEPAM) Take 1 tablet by mouth at bedtime  #30 x 3   Entered by:   Cyril Loosen, RN, BSN   Authorized by:   Lewayne Bunting, MD, [facility]   Signed by:   Cyril Loosen, RN, BSN on 09/15/2010   Method used:   Handwritten   RxID:   [id number]

## 2010-09-23 NOTE — Consult Note (Signed)
Summary: CARDIOLOGY CONSULT/[facility]  CARDIOLOGY CONSULT/[facility]   Imported By: Zachary George 09/15/2010 12:22:53  _____________________________________________________________________  External Attachment:    Type:   Image     Comment:   External Document

## 2010-09-25 DIAGNOSIS — I491 Atrial premature depolarization: Secondary | ICD-10-CM

## 2010-09-29 ENCOUNTER — Other Ambulatory Visit: Payer: Self-pay | Admitting: *Deleted

## 2010-09-29 DIAGNOSIS — I10 Essential (primary) hypertension: Secondary | ICD-10-CM

## 2010-09-29 MED ORDER — LISINOPRIL 5 MG PO TABS: 5.0000 mg | ORAL_TABLET | Freq: Every day | ORAL | Status: DC

## 2010-09-30 ENCOUNTER — Telehealth: Payer: Self-pay | Admitting: *Deleted

## 2010-09-30 ENCOUNTER — Encounter (INDEPENDENT_AMBULATORY_CARE_PROVIDER_SITE_OTHER): Payer: BC Managed Care – PPO | Admitting: Surgery

## 2010-09-30 DIAGNOSIS — I359 Nonrheumatic aortic valve disorder, unspecified: Secondary | ICD-10-CM

## 2010-09-30 DIAGNOSIS — I10 Essential (primary) hypertension: Secondary | ICD-10-CM

## 2010-09-30 NOTE — Telephone Encounter (Signed)
Patient question if he can increase his Lisinopril to twice a day.  BP running up in the evening.   3/27 - 182/76  74 3/26 - 169/87  76 3/25 - 178/80  88 3/24 - 179/82  78 3/23 - 175/77  84 States he is currently wearing the Cardionet monitor.  Also, is taking Temazpam 15mg  qhs.  Not working very good.  Thought you said would be okay to take 2 if needed.   Please clarify or should he see PMD for this.  Not due to see you again till June.

## 2010-10-01 NOTE — Assessment & Plan Note (Signed)
OFFICE VISIT  [name] [name], PINEDO DOB:  1955-09-23                                        September 30, 2010 CHART #:  [id number]  The patient returned to my office today for evaluation of his chest incision, status post coronary artery bypass graft surgery and aortic valve replacement on August 22, 2010.  He said that about 2 days ago, he noticed some swelling at the end of his chest incision with slight surrounding redness.  Yesterday, it was opened up and drained small amount of whitish material and he was concerned about that.  He has had no fever or chills and otherwise feels fine.  On examination today, the sternotomy incision is healing well.  At the lower end of the sternotomy incision, there is a very slightly raised area that has closed over, that looks like where the suture knot had dissolved and drained through the skin.  There is no erythema and no tenderness.  The remainder of the incision looks fine.  IMPRESSION:  I suspect the patient had spontaneous drainage of the dissolved suture material from the knot at the lower end of the incision.  This should resolve the problem.  There are no signs of infection.  I reassured him and told him to return to my office if there is any change.  Evelene Croon, M.D. Electronically Signed  BB/MEDQ  D:  09/30/2010  T:  10/01/2010  Job:  [id number]

## 2010-10-02 ENCOUNTER — Encounter: Payer: Self-pay | Admitting: Cardiology

## 2010-10-06 LAB — COMPREHENSIVE METABOLIC PANEL
ALT: 36 U/L (ref 0–53)
AST: 27 U/L (ref 0–37)
Albumin: 4.4 g/dL (ref 3.5–5.2)
CO2: 29 mEq/L (ref 19–32)
Chloride: 101 mEq/L (ref 96–112)
GFR calc Af Amer: >60 mL/min (ref >60)
GFR calc non Af Amer: 55 mL/min — ABNORMAL LOW (ref >60)
Potassium: 4.1 mEq/L (ref 3.5–5.1)
Sodium: 138 mEq/L (ref 135–145)
Total Bilirubin: 1 mg/dL (ref 0.3–1.2)

## 2010-10-07 LAB — BASIC METABOLIC PANEL
BUN: 14 mg/dL (ref 6–23)
CO2: 28 mEq/L (ref 19–32)
Calcium: 9.1 mg/dL (ref 8.4–10.5)
Creatinine, Ser: 0.78 mg/dL (ref 0.4–1.5)
GFR calc non Af Amer: >60 mL/min (ref >60)
Glucose, Bld: 130 mg/dL — ABNORMAL HIGH (ref 70–99)

## 2010-10-07 LAB — POCT I-STAT 3, VENOUS BLOOD GAS (G3P V)
Bicarbonate: 23.3 mEq/L (ref 20.0–24.0)
Bicarbonate: 25.9 mEq/L — ABNORMAL HIGH (ref 20.0–24.0)
O2 Saturation: 66 %
O2 Saturation: 68 %
TCO2: 25 mmol/L (ref 0–100)
pCO2, Ven: 44.7 mmHg — ABNORMAL LOW (ref 45.0–50.0)
pH, Ven: 7.325 — ABNORMAL HIGH (ref 7.250–7.300)
pO2, Ven: 37 mmHg (ref 30.0–45.0)

## 2010-10-07 LAB — CBC
HCT: 38.6 % — ABNORMAL LOW (ref 39.0–52.0)
Hemoglobin: 13.7 g/dL (ref 13.0–17.0)
MCHC: 35.5 g/dL (ref 30.0–36.0)
MCV: 95.4 fL (ref 78.0–100.0)
RBC: 4.05 MIL/uL — ABNORMAL LOW (ref 4.22–5.81)
RDW: 12.4 % (ref 11.5–15.5)

## 2010-10-07 LAB — POCT I-STAT 3, ART BLOOD GAS (G3+)
O2 Saturation: 93 %
TCO2: 25 mmol/L (ref 0–100)
pCO2 arterial: 39 mmHg (ref 35.0–45.0)
pH, Arterial: 7.396 (ref 7.350–7.450)
pO2, Arterial: 67 mmHg — ABNORMAL LOW (ref 80.0–100.0)

## 2010-10-07 LAB — GLUCOSE, CAPILLARY: Glucose-Capillary: 111 mg/dL — ABNORMAL HIGH (ref 70–99)

## 2010-10-09 NOTE — Op Note (Signed)
NAME:  GAILEN, VENNE NO.:  [id number]  MEDICAL RECORD NO.:  [id number]           PATIENT TYPE:  I  LOCATION:  2308                         FACILITY:  [facility]  PHYSICIAN:  Guadalupe Maple, M.D.  DATE OF BIRTH:  08-19-1955  DATE OF PROCEDURE:  08/22/2010 DATE OF DISCHARGE:                              OPERATIVE REPORT   PROCEDURE:  Intraoperative transesophageal echocardiography.  Mr. [name] [name] is a 55 year old white male with a history of diabetes, hypertension, and hyperlipidemia and single-vessel coronary disease and he is  status post stenting of his mid right coronary artery in November 2010. He also had a known history of aortic stenosis and developed intermittent chest pressure.  He was subsequently evaluated, it is determined that his symptoms were the result of a symptomatic aortic stenosis and he is scheduled to undergo aortic valve replacement. Intraoperative transesophageal echocardiography was requested to evaluate the aortic valve to assist with determination of function of the prosthetic valve and to serve as a monitor for intraoperative volume status and to assess for any other valvular pathology.  The patient was brought to the operating room at [facility] and general anesthesia was induced without difficulty. Following induction of anesthesia, the trachea was intubated without difficulty. Following orogastric suctioning, the transesophageal echocardiography probe was then inserted into the esophagus without difficulty.  IMPRESSION:  Prebypass findings: 1. Aortic valve.  There was a bicuspid aortic valve.  The leaflets     were heavily calcified with restricted opening.  It appeared that     there was raphe between the right and noncoronary cusps.  There was     no aortic insufficiency noted.  The aortic annulus measured 2.59     cm.  Continuous wave Doppler interrogation of the left ventricular     outflow tract revealed a peak  velocity of 3.13 meters per second     and mean gradient of 26.6 mmHg.  The aortic valve area using the V-     max method was 1.28 cm2 and 1.32 cm2 using the VTI method. 2. Mitral valve.  The mitral valve showed that the leaflets opened     normally.  There was no prolapse or fluttering.  There was good     coaptation of the leaflets and trace mitral insufficiency. There     was mild-to-moderate mitral annular calcification noted. 3. Left ventricle.  There was moderate left ventricular hypertrophy.     Left ventricular wall thickness measured 1.25-1.32 cm at end     diastole at the mid papillary level.  Left ventricular end-     diastolic diameter measured 4.84 cm and left ventricular end-     systolic diameter measured 2.83 cm.  There was good contractility     in all segments interrogated and ejection fraction was estimated at     60-65%. 4. Right ventricle.  There was normal-appearing right ventricular size     and function.  There was good contractility of the right     ventricular free wall. 5. Tricuspid valve.  There was trace tricuspid insufficiency and  normal-appearing tricuspid leaflets. 6. Interatrial septum.  The interatrial septum was intact without     evidence of patent foramen evaluate or atrial septal defect by     color Doppler and bubble study. 7. Left atrium.  The left atrial size appeared to be within normal     limits.  There was no thrombus noted in the left atrium or left     atrial appendage. 8. Ascending aorta.  The ascending aorta appeared free of significant     atheromatous disease and was not aneurysmal and there was a well-     defined aortic root and sinotubular ridge. 9. Descending aorta.  The descending aorta showed mild atheromatous     disease and measured 2.27 cm in diameter.  Postbypass findings: 1. Aortic valve.  There was a bioprosthetic valve in the aortic     position.  The leaflets opened normally.  There was no aortic      insufficiency. 2. Mitral valve.  The mitral valve was unchanged with prebypass study.     There was trace mitral insufficiency. 3. Left ventricle.  There was good contractility in all segments     interrogated.  Ejection fraction again estimated at 60-65%. 4. Right ventricle.  There was normal-appearing right ventricular     function 5. Tricuspid valve.  There was trace tricuspid insufficiency.          ______________________________ Guadalupe Maple, M.D.     DCJ/MEDQ  D:  08/22/2010  T:  08/23/2010  Job:  [id number]  Electronically Signed by Kipp Brood M.D. on 10/09/2010 09:04:43 AM

## 2010-10-09 NOTE — Telephone Encounter (Signed)
Left message on voicemail for return call.   Patient checking to see if MD had responded to previous message.  Still needs question answered about blood pressure med, but does state that his sleeping is getting better.

## 2010-10-14 NOTE — Telephone Encounter (Signed)
OK to take 2 tablets 15 mg of Temazepam for insomnia. Needs better BP control- suggest to add Chlorthalidone 25 mg PO qdaily- if current medication list is correct.

## 2010-10-20 MED ORDER — CHLORTHALIDONE 25 MG PO TABS: 25.0000 mg | ORAL_TABLET | Freq: Every day | ORAL | Status: DC

## 2010-10-20 NOTE — Telephone Encounter (Signed)
Pt notified to add Chlorthalidone 25mg  daily.  Medication list is current as of 09/15/10 with no changes.  Did stop Amiodarone as instructed at last OV.  Advised to continue his Lisinopril 5mg  daily.  States sleep is doing okay now.  Does have f/u scheduled for 6/13.  Advised to monitor bp and if no better to contact office before OV.  Patient verbalized understanding.

## 2010-10-23 ENCOUNTER — Telehealth: Payer: Self-pay | Admitting: *Deleted

## 2010-10-23 DIAGNOSIS — I10 Essential (primary) hypertension: Secondary | ICD-10-CM

## 2010-10-23 NOTE — Telephone Encounter (Signed)
Recently received rx for Chlorthalidone 25mg .  Insert said to notify MD if you have sulfa allergy.  States he had to be put in hospital one time when he took Bactrim.  Upset stomach, hives, itching.  No throat swelling.  Advise if you want to change med.

## 2010-10-24 MED ORDER — LISINOPRIL 5 MG PO TABS: 10.0000 mg | ORAL_TABLET | Freq: Every day | ORAL | Status: DC

## 2010-10-24 NOTE — Telephone Encounter (Signed)
Yes that's a sulfa drug just like HCTZ , indapamide etc. Suggest to increase lisinopril to 10mg  po daily and d/c chlothalidone

## 2010-10-24 NOTE — Telephone Encounter (Signed)
Patient notified verbalized understanding

## 2010-11-19 ENCOUNTER — Telehealth: Payer: Self-pay | Admitting: *Deleted

## 2010-11-19 ENCOUNTER — Encounter: Payer: Self-pay | Admitting: *Deleted

## 2010-11-19 NOTE — Telephone Encounter (Signed)
Message copied by Hoover Brunette on Wed Nov 19, 2010 10:07 AM ------      Message from: Lewayne Bunting      Created: Tue Nov 11, 2010 11:29 AM      Regarding: Event Monitor       Rare PACs associated with palpitations.  Otherwise no significant arrhythmias.  Nothing specific to do can be discussed during next office visit

## 2010-11-19 NOTE — Telephone Encounter (Signed)
Patient notified of results by letter  

## 2010-11-20 ENCOUNTER — Encounter: Payer: Self-pay | Admitting: Cardiology

## 2010-11-21 NOTE — Discharge Summary (Signed)
NAME:  [name], [name] NO.:  [id number]   MEDICAL RECORD NO.:  [id number]          PATIENT TYPE:  INP   LOCATION:  4735                         FACILITY:  [facility]   PHYSICIAN:  Learta Codding, M.D. LHCDATE OF BIRTH:  1956/05/19   DATE OF ADMISSION:  02/05/2005  DATE OF DISCHARGE:  02/06/2005                                 DISCHARGE SUMMARY   PROCEDURE:  1.  Cardiac catheterization.  2.  Coronary arteriogram.  3.  Left ventriculogram.   DISCHARGE DIAGNOSES:  1.  Chest pain, no critical coronary artery disease by catheterization, with      preserved left ventricular function and ejection fraction of 65%.  2.  Hypertension.  3.  Diabetes.  4.  History of carpal tunnel surgery, lithotripsy, and foot surgery.  5.  History of joint pain, possible osteoarthritis.  6.  Obstructive sleep apnea on C-PAP.  7.  Family history of premature coronary artery disease in his mother, who      had bypass surgery at 31.  8.  Hypokalemia.  9.  Dyslipidemia.  10. Mild hypokalemia.   HISTORY OF PRESENT ILLNESS:  [name] [name] is a 55 year old male with no known  history of coronary artery disease. He went to [facility] for  substernal chest pain. His cardiac enzymes were within normal limits and he  was evaluated there by cardiology. Because of multiple cardiac risk factors  including a family history of premature coronary artery disease, he was  transferred to [facility] for further evaluation and  catheterization.   HOSPITAL COURSE:  The cardiac catheterization showed non-obstructive  coronary artery disease with multiple lesions between 25% and 50% in the  LAD, OM, and RCA. His EF was 65%.   On February 06, 2005, he was evaluated by Dr. Andee Lineman. Dr. Andee Lineman felt that  medical therapy for non-obstructive disease with aspirin and Statin was  indicated. His hemoglobin A1C was elevated at 7.0 and he is to followup with  his family physician. He was started on Metformin  but this medication was  held for 48 hours post catheterization. Beta blocker was discontinued and an  ace inhibitor was started because of the diabetes. His blood pressure was  under better control and he was considered stable for discharge on February 06, 2005 with outpatient followup arranged.   DISCHARGE INSTRUCTIONS:  1.  His activity level is to be per the catheterization discharge sheet.  2.  He is to stick to a low-fat diabetic diet.  3.  He is to followup with Dr. Linna Darner.  4.  See Arnette Felts, [facility] for Dr. Andee Lineman on February 11, 2005 at 2:00 p.m.  5.  He is to record blood sugars twice a day and give those results to Dr.      Linna Darner.   DISCHARGE MEDICATIONS:  1.  Lipitor 40 mg a day.  2.  Aspirin 81 mg q.d.  3.  Lisinopril 10 mg q.d.  4.  Metformin 500 mg b.i.d., start 48 hours post catheterization.      Theodore Demark, P.A. [facility]  Learta Codding, M.D. [facility]  Electronically Signed    RB/MEDQ  D:  04/28/2005  T:  04/28/2005  Job:  [id number]   cc:   Dr. Linna Darner   [facility]  [place], [place].

## 2010-11-21 NOTE — Op Note (Signed)
NAME:  [name] [name], [name] [name] NO.:  [id number]   MEDICAL RECORD NO.:  [id number]          PATIENT TYPE:  AMB   LOCATION:  DSC                          FACILITY:  [facility]   PHYSICIAN:  Katy Fitch. 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   OPERATION:  Release of left transverse carpal ligament.   SURGEON:  Katy Fitch. Sypher, M.D.   ASSISTANT:  Marveen Reeks. Dasnoit, P.A.-C.   ANESTHESIA:  General by LMA.   ANESTHESIOLOGIST:  Judie Petit, M.D.   INDICATIONS FOR PROCEDURE:  [name] [name] is a 55 year old gentleman who is  afflicted with bilateral carpal tunnel syndrome.  He is status post release  of his right transverse carpal ligament two weeks prior with very  satisfactory early results.  He now presents for similar surgery on the  left.  Preoperatively, he was noted to have bilateral carpal tunnel syndrome  on clinical examination and electrodiagnostic studies confirmed bilateral  median neuropathy at wrist level.  Due to a failure to respond to  nonoperative measures, he is brought to the operating room at this time for  release of his left transverse carpal ligament.   PROCEDURE:  [name] [name] is brought to the operating room and placed on  supine position on the operating table.  Following induction of general  anesthesia by LMA technique, the left arm was prepped with Betadine solution  and sterilely draped.  Following exsanguination of the left arm with an  Esmarch bandage, an arterial tourniquet was inflated to 220 mmHg.  The  procedure commenced with a short incision in the line of the ring finger in  the palm.  The subcutaneous tissues were carefully divided  revealing the  palmar fascia.  This was split longitudinally to the common sensory branches  of the median nerve.  These were followed back to the transverse carpal  ligament which was carefully isolated from the median nerve.  The ligament  was then released with scissors along its ulnar border extending into the  distal forearm.  The release widely opened the carpal canal.  There were no  masses or other predicaments noted.  Bleeding points along the margins of  the released ligament were electrocauterized with bipolar current.  The  wound was then repaired with intradermal 3-0 Prolene suture.  A compressive  dressing was applied with a volar plaster splint maintaining the wrist in 5  degrees dorsiflexion.    RVS/MEDQ  D:  10/24/2004  T:  10/24/2004  Job:  [id number]

## 2010-11-21 NOTE — Op Note (Signed)
NAME:  [name] [name], [name] [name] NO.:  [id number]   MEDICAL RECORD NO.:  [id number]          PATIENT TYPE:  AMB   LOCATION:  DSC                          FACILITY:  [facility]   PHYSICIAN:  Katy Fitch. Sypher Jr., M.D.DATE OF BIRTH:  Dec 10, 1955   DATE OF PROCEDURE:  10/10/2004  DATE OF DISCHARGE:                                 OPERATIVE REPORT   PREOPERATIVE DIAGNOSIS:  Entrapment neuropathy right median nerve at level  of wrist.   POSTOPERATIVE DIAGNOSIS:  Entrapment neuropathy right median nerve at level  of wrist.   OPERATION:  Release of right transverse carpal ligament.   SURGEON:  Katy Fitch. Sypher, M.D.   ASSISTANT:  Marveen Reeks. Dasnoit, P.A.-C.   ANESTHESIA:  General by LMA.   ANESTHESIOLOGIST:  Janetta Hora. Gelene Mink, M.D.   INDICATIONS FOR PROCEDURE:  [name] [name] is a 55 year old gentleman with a  history of hand numbness.  He is also noted to have early Dupuytren's  disease and knuckle pads.  Preoperatively, clinical examination revealed  signs of entrapment neuropathy of the median nerve of the wrist wrist.  Electrodiagnostic studies confirmed significant median neuropathy at this  level.  Due to a failure to respond to nonoperative measures, he is brought  to the operating room at this time for release of his right transverse  carpal ligament.   PROCEDURE:  [name] is brought to the operating room and placed in  supine position on the operating table.  Following the induction of general  anesthesia by LMA technique, the right arm was prepped with Betadine  solution and sterilely draped.  Following exsanguination of the limb with an  Esmarch bandage, an arterial tourniquet was inflated to 250 mmHg due to  background hypertension.  The procedure commenced with a short incision in  the line of the ring finger ray in the palm.  The subcutaneous tissues were  carefully divided identifying the palmar fascia.  The pre-tendinous fibers  to the ring finger were  resected to try to prevent aggravation of the  Dupuytren's disease.  The common sensory branches of the median nerve were  identified and followed proximally until the median nerve proper was  identified.  A Penfield 4 elevator was used to make a plane between the  median nerve and the deep surface of the transverse carpal ligament.  The  transverse carpal ligament was released with scissors subcutaneously along  its ulnar  border extending into the distal forearm.  This release allowed  wide opening of the carpal canal.  The contents of the carpal canal were  inspected.  There was chronic ulnar bursitis noted.  No masses were  identified.  The wound was then inspected for bleeding points which were  electrocauterized with bipolar forceps.  The skin was repaired with  intradermal 3-0 Prolene suture.  A compressive dressing was applied with a  volar plaster splint with the wrist in 5 degrees dorsiflexion.   For aftercare, Mr. Macomber was given a prescription for Percocet 5 mg 1 p.o.  q.4-6h. p.r.n. pain, 20 tablets, without refill.  RVS/MEDQ  D:  10/10/2004  T:  10/10/2004  Job:  [id number]

## 2010-11-21 NOTE — Cardiovascular Report (Signed)
NAME:  MAYANK, TEUSCHER NO.:  [id number]   MEDICAL RECORD NO.:  [id number]          PATIENT TYPE:  INP   LOCATION:  4735                         FACILITY:  [facility]   PHYSICIAN:  Vida Roller, M.D.   DATE OF BIRTH:  02/17/1956   DATE OF PROCEDURE:  02/05/2005  DATE OF DISCHARGE:                              CARDIAC CATHETERIZATION   PRIMARY:  Dr. Linna Darner.   CARDIOLOGIST:  Dr. Lewayne Bunting.   HISTORY OF PRESENT ILLNESS:  Mr. Reffner is a 55 year old man with significant  cardiac risk factors and chest discomfort which was concerning for unstable  angina.  He was sent from [facility] to [facility] for a heart  catheterization.  He did not have any cardiac enzymes which were abnormal.  His electrocardiogram is mildly abnormal.   PROCEDURES PERFORMED:  1.  Left heart catheterization.  2.  Selective coronary angiography.  3.  Left ventriculography.   DETAILS OF THE PROCEDURE:  After obtaining informed consent, the patient was  brought to the cardiac catheterization laboratory.  There, he was prepped  and draped in the usual sterile manner and local anesthetic was obtained  over the groin using 1% lidocaine without epinephrine.  The right femoral  artery was cannulated using the modified Seldinger technique with a 6-French  10-cm sheath and left heart catheterization was performed using a 6-French  left #4 and a 6-French right #4, and a 6-French pigtail catheter; the  pigtail catheter was used for left ventriculography in the 30-degree RAO  view.  At the conclusion of the procedure, the catheters were removed, the  patient was moved back to the Cardiology Holding Area, the femoral artery  sheath was removed, hemostasis was obtained using direct manual pressure and  at the conclusion of the hold, there was no evidence of ecchymosis or  hematoma formation and distal pulses were intact.   TOTAL FLUOROSCOPIC TIME:  5.9 minutes.   TOTAL IODINATED CONTRAST:  120  mL.   RESULTS:  Aortic pressure 138/78 with a mean of 101.   Left ventricular pressure 145/21 with a end-diastolic pressure of 28 mmHg.   SELECTIVE CORONARY ANGIOGRAPHY:  The left main coronary artery is a large-  caliber vessel which has luminal irregularities in its mid-portion.   The left circumflex coronary artery is a moderate-caliber vessel with a  large branching obtuse marginal and the remainder of the circumflex in the  AV groove is small.  There are luminal irregularities in both the proximal  circumflex as well as the obtuse marginal.  The worst stenosis in the obtuse  marginal is about 50%.   The left anterior descending coronary artery is a moderate-caliber vessel  with 1 large branching diagonal branch.  There are luminal irregularities  throughout the left anterior descending coronary artery.   The right coronary artery is a moderate-caliber dominant vessel with a  moderate-caliber posterior descending coronary artery.  There are just  diffuse luminal irregularities throughout the right coronary artery, most  significant of which being a 50% lesion in the mid-portion of the artery.  The posterior descending is  a moderate-caliber vessel which has luminal  irregularities, but no significant obstructive disease.  There are 2  branching posterolateral branches.   LEFT VENTRICULOGRAM:  Left ventriculogram reveals normal LV systolic  function, estimated ejection fraction 65%, no obvious wall motion  abnormalities and no mitral regurgitation.   ASSESSMENT:  1.  Nonobstructive coronary disease.  2.  Normal left ventricular systolic function.   PLAN:  My plan then is medical therapy for his hypertension, aggressive  statin therapy for his hyperlipidemia and we will send him home tomorrow.       JH/MEDQ  D:  02/05/2005  T:  02/06/2005  Job:  [id number]

## 2010-11-25 ENCOUNTER — Encounter: Payer: Self-pay | Admitting: Nurse Practitioner

## 2010-11-26 ENCOUNTER — Encounter: Payer: Self-pay | Admitting: Nurse Practitioner

## 2010-12-17 ENCOUNTER — Ambulatory Visit: Payer: BC Managed Care – PPO | Admitting: Cardiology

## 2010-12-22 ENCOUNTER — Encounter: Payer: Self-pay | Admitting: Cardiovascular Disease

## 2010-12-22 ENCOUNTER — Ambulatory Visit (INDEPENDENT_AMBULATORY_CARE_PROVIDER_SITE_OTHER): Payer: BC Managed Care – PPO | Admitting: Cardiovascular Disease

## 2010-12-22 VITALS — BP 147/85 | HR 80 | Ht 70.0 in | Wt 211.0 lb

## 2010-12-22 DIAGNOSIS — I359 Nonrheumatic aortic valve disorder, unspecified: Secondary | ICD-10-CM

## 2010-12-22 DIAGNOSIS — I951 Orthostatic hypotension: Secondary | ICD-10-CM

## 2010-12-22 DIAGNOSIS — Z8679 Personal history of other diseases of the circulatory system: Secondary | ICD-10-CM

## 2010-12-22 DIAGNOSIS — I4891 Unspecified atrial fibrillation: Secondary | ICD-10-CM

## 2010-12-22 DIAGNOSIS — I259 Chronic ischemic heart disease, unspecified: Secondary | ICD-10-CM

## 2010-12-22 NOTE — Assessment & Plan Note (Signed)
No symptoms suggestive of angina. Continue medical therapy. 

## 2010-12-22 NOTE — Progress Notes (Signed)
HPI  This is a 55 year old male who is here today for followup visit. He has history of severe aortic stenosis due to bicuspid aortic valve status post aortic valve replacement with a tissue valve in February of 2012. He also has history of coronary artery disease status post right coronary artery angioplasty and stent placement in 2010 and then. He had brief post operative atrial fibrillation which was successfully treated with short-term course of amiodarone. He was hospitalized briefly after his surgery due to syncope which was thought to be due to orthostatic hypotension and bradycardia. Echocardiogram showed normal LV systolic function with a normal functioning aortic prosthesis with a mean gradient of 15 mm mercury. Metoprolol was stopped at that time. The patient has not had any events since then. He is feeling very well. He denies any chest pain, dyspnea, dizziness, or syncope. He is exercising in a regular basis and able to do 30 minutes on the treadmill.. Before his surgery he was able to do it 2 or 3 minutes max.  Allergies  Allergen Reactions  . Hctz (Hydrochlorothiazide)     Nausea and headache   . Sulfa Antibiotics   . Sulfonamide Derivatives      Current Outpatient Prescriptions on File Prior to Visit  Medication Sig Dispense Refill  . aspirin (SB LOW DOSE ASA EC) 81 MG EC tablet Take 81 mg by mouth daily.        [name] atorvastatin (LIPITOR) 40 MG tablet Take 40 mg by mouth daily.        . cholecalciferol (VITAMIN D) 1000 UNITS tablet Take 1,000 Units by mouth daily.        . clopidogrel (PLAVIX) 75 MG tablet Take 75 mg by mouth daily.        [name] glipiZIDE (GLUCOTROL) 5 MG tablet Take 5 mg by mouth daily.        . metFORMIN (GLUCOPHAGE) 500 MG tablet Take 500 mg by mouth daily.        . niacin (NIASPAN) 1000 MG CR tablet Take 1,000 mg by mouth at bedtime.        . temazepam (RESTORIL) 15 MG capsule Take 15 mg by mouth at bedtime as needed.        [name] DISCONTD: lisinopril  (PRINIVIL,ZESTRIL) 5 MG tablet Take 2 tablets (10 mg total) by mouth daily.  90 tablet  2     Past Medical History  Diagnosis Date  . OSA on CPAP 2006  . NIDDM (non-insulin dependent diabetes mellitus)   . Diastolic dysfunction 8/09  . Aortic stenosis 8/09    Bicuspid aortic valve  . CTS (carpal tunnel syndrome)     bilateral   . Hypertension   . Sinusitis   . Nasal congestion   . Hyperlipidemia   . Vitamin D deficiency 06/13/09  . Arthritis 6/10    hand   . Kidney stones   . Paresthesias in left hand 02/19/2004  . Paresthesias in right hand 02/19/2004  . CAD (coronary artery disease) 8/09  . Atrial fibrillation     post op only     Past Surgical History  Procedure Date  . Coronary angioplasty with stent placement     to rt coronary atery (Dr. Degert-cardiologist)  . Tissue aortic valve replacement   . Carpal tunnel release     bilateral      Family History  Problem Relation Age of Onset  . Diabetes      Family History      History  Social History  . Marital Status: Married    Spouse Name: N/A    Number of Children: N/A  . Years of Education: N/A   Occupational History  . Not on file.   Social History Main Topics  . Smoking status: Former Smoker -- 1.5 packs/day for 25 years    Types: Cigarettes    Quit date: 07/06/2002  . Smokeless tobacco: Never Used  . Alcohol Use: Not on file  . Drug Use: Not on file  . Sexually Active: Not on file   Other Topics Concern  . Not on file   Social History Narrative  . No narrative on file       PHYSICAL EXAM   BP 147/85  Pulse 80  Ht 5\' 10"  (1.778 m)  Wt 211 lb (95.709 kg)  BMI 30.28 kg/m2  SpO2 98% Constitutional: He is oriented to person, place, and time. He appears well-developed and well-nourished. No distress.  HENT: No nasal discharge.  Head: Normocephalic and atraumatic.  Eyes: Pupils are equal, round, and reactive to light. Right eye exhibits no discharge. Left eye exhibits no discharge.    Neck: Normal range of motion. Neck supple. No JVD present. No thyromegaly present.  Cardiovascular: Normal rate, regular rhythm, normal heart sounds and intact distal pulses. Exam reveals no gallop and no friction rub.  There is a 2/6 systolic ejection murmur at the aortic area. Pulmonary/Chest: Effort normal and breath sounds normal. No stridor. No respiratory distress. He has no wheezes. He has no rales. He exhibits no tenderness.  Abdominal: Soft. Bowel sounds are normal. He exhibits no distension. There is no tenderness. There is no rebound and no guarding.  Musculoskeletal: Normal range of motion. He exhibits no edema and no tenderness.  Neurological: He is alert and oriented to person, place, and time. Coordination normal.  Skin: Skin is warm and dry. No rash noted. He is not diaphoretic. No erythema. No pallor.  Psychiatric: He has a normal mood and affect. His behavior is normal. Judgment and thought content normal.       EKG: Normal sinus rhythm with no significant ST or T wave changes.   ASSESSMENT AND PLAN

## 2010-12-22 NOTE — Patient Instructions (Signed)
Your physician wants you to follow-up in: 9 months. You will receive a reminder letter in the mail one-two months in advance. If you don't receive a letter, please call our office to schedule the follow-up appointment. Your physician recommends that you continue on your current medications as directed. Please refer to the Current Medication list given to you today. 

## 2010-12-22 NOTE — Assessment & Plan Note (Signed)
The patient seems to be doing very well at this time. His exercise capacity is excellent. No more chest pain, dyspnea dizziness. Continue aspirin daily.

## 2010-12-22 NOTE — Assessment & Plan Note (Signed)
The patient is not having any symptoms of orthostatic hypotension anymore.

## 2010-12-22 NOTE — Assessment & Plan Note (Signed)
This was brief post operatively. He is in normal sinus rhythm. No indication for long-term anticoagulation.

## 2011-01-01 DIAGNOSIS — Z0271 Encounter for disability determination: Secondary | ICD-10-CM

## 2011-03-14 ENCOUNTER — Other Ambulatory Visit: Payer: Self-pay | Admitting: Cardiology

## 2011-08-08 IMAGING — CR DG CHEST 2V
2 series · 2 of 2 positions shown · non-contrast
Comparison: Chest x-ray 08/24/2010.

CLINICAL DATA: Postop aortic valve replacement surgery.

CHEST - 2 VIEW

[view not recorded (1 of 2)]
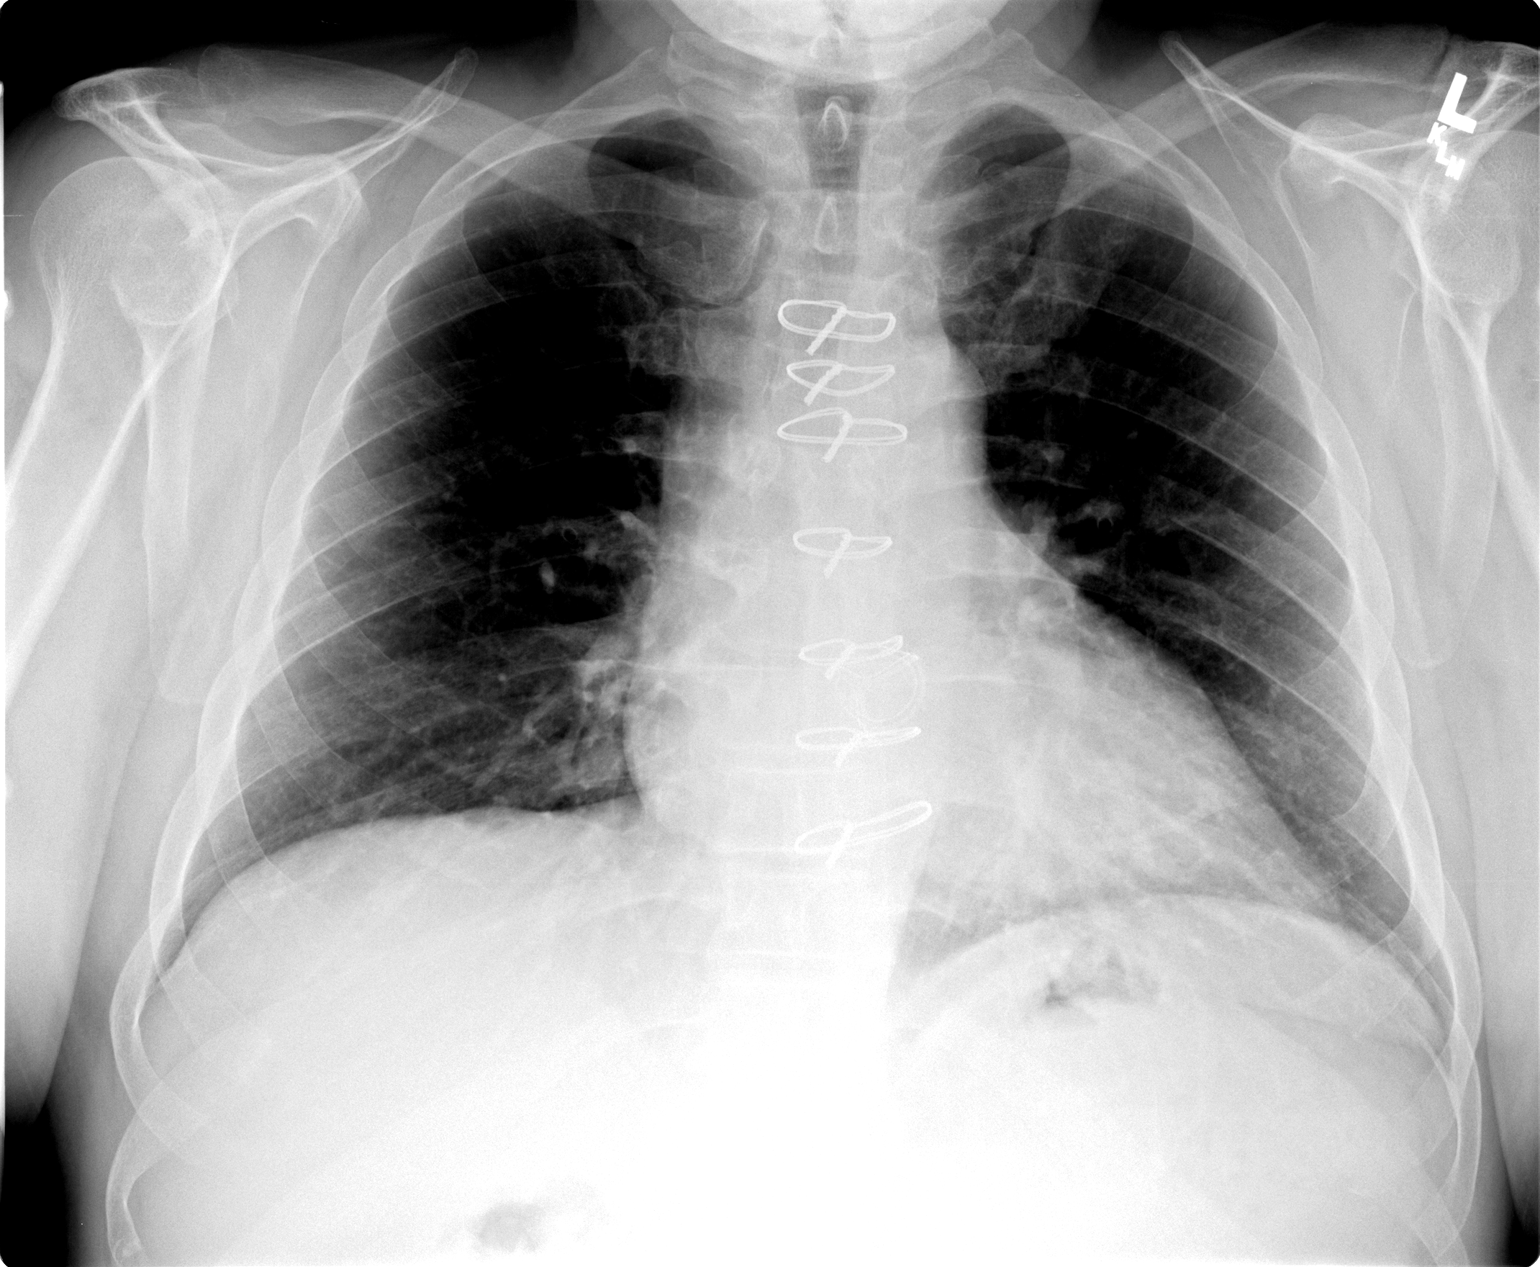

[view not recorded (2 of 2)]
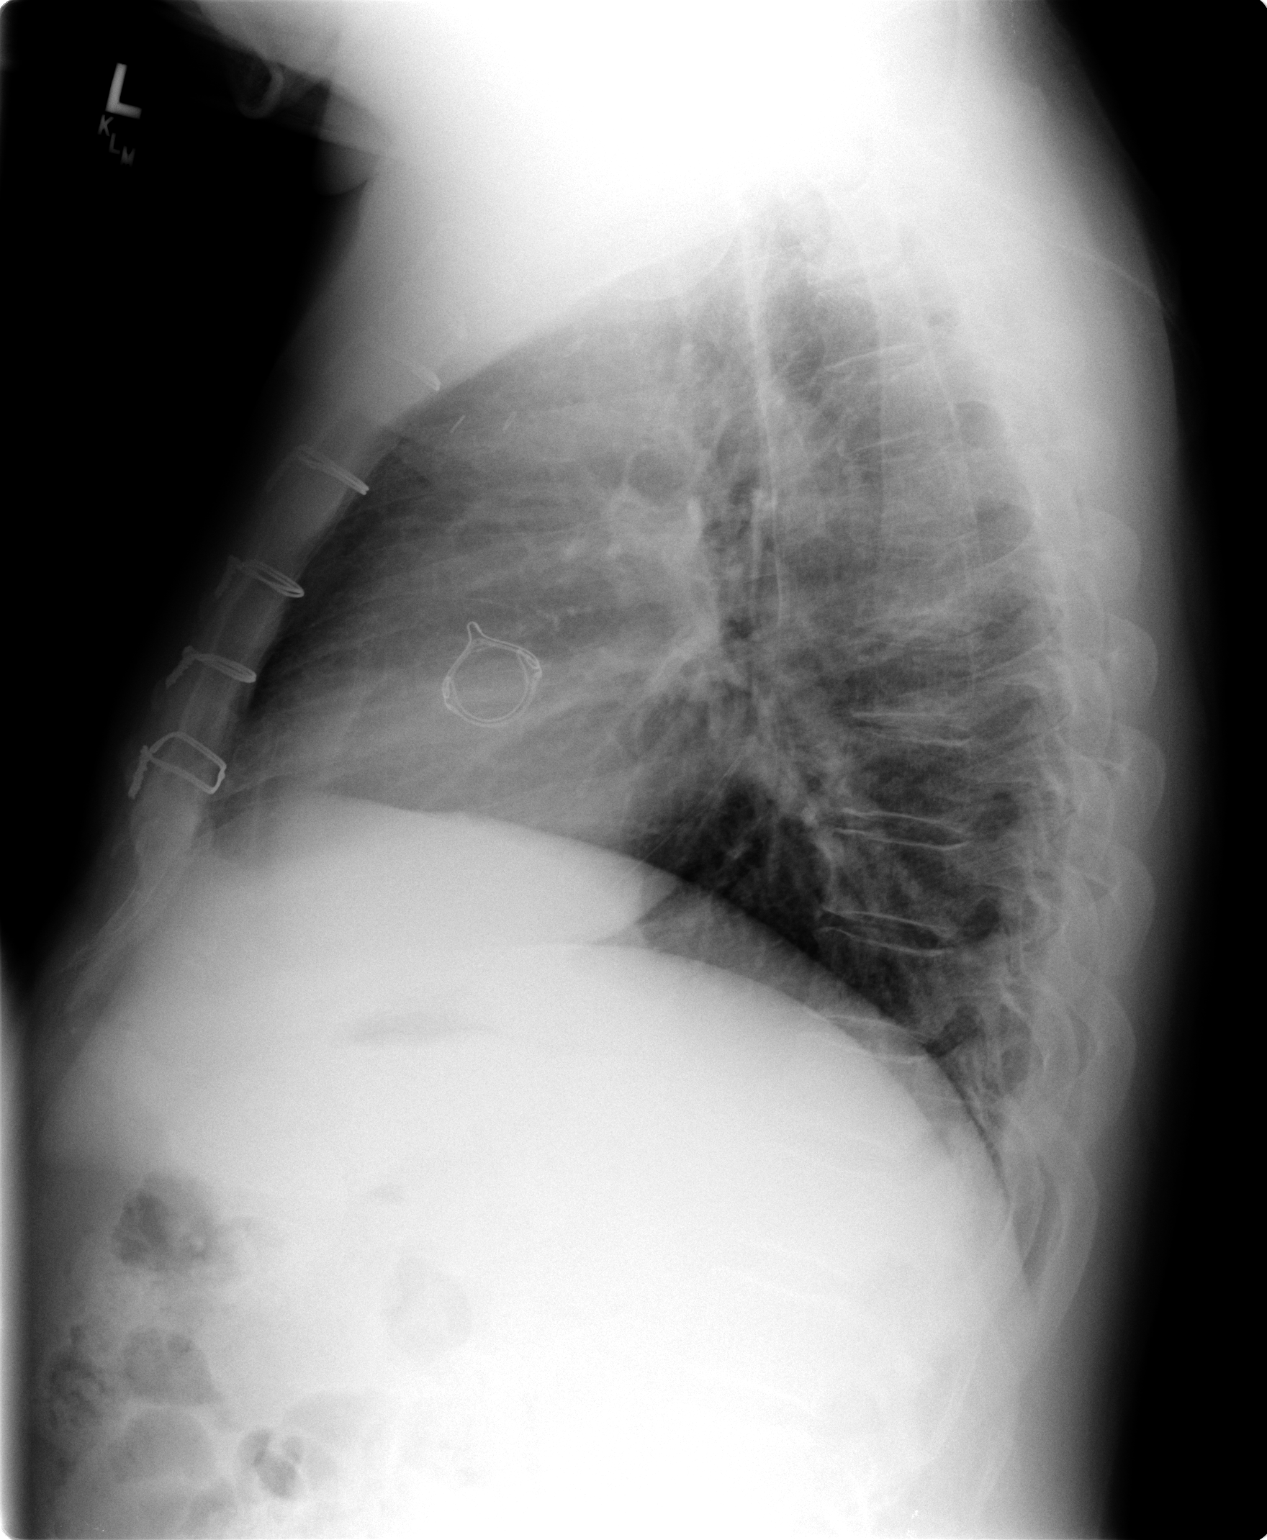

[2 of 2 positions shown; findings below may reference images not displayed]

FINDINGS: There are stable surgical changes from aortic valve
replacement surgery.  No complicating features are demonstrated.
The cardiac silhouette, mediastinal and hilar contours are within
normal limits.  The lungs are clear.  The bony thorax is intact
IMPRESSION: Stable postoperative changes.
No acute cardiopulmonary findings.

## 2011-09-21 ENCOUNTER — Ambulatory Visit (INDEPENDENT_AMBULATORY_CARE_PROVIDER_SITE_OTHER): Payer: BC Managed Care – PPO | Admitting: Cardiology

## 2011-09-21 ENCOUNTER — Encounter: Payer: Self-pay | Admitting: Cardiology

## 2011-09-21 VITALS — BP 137/82 | HR 87 | Ht 70.0 in | Wt 210.0 lb

## 2011-09-21 DIAGNOSIS — I4891 Unspecified atrial fibrillation: Secondary | ICD-10-CM

## 2011-09-21 NOTE — Patient Instructions (Signed)
Continue all current medications. Echo in one year  Your physician wants you to follow up in:  1 year.  You will receive a reminder letter in the mail one-two months in advance.  If you don't receive a letter, please call our office to schedule the follow up appointment

## 2011-09-21 NOTE — Progress Notes (Signed)
[name] Bottoms, MD, [facility] ABIM Board Certified in Adult Cardiovascular Medicine,Internal Medicine and Critical Care Medicine    CC: Followup pressure with a history of aortic valve replacement.  HPI:  The patient is a 56 year old male with history of aortic valve replacement secondary to bicuspid aortic valve and aortic stenosis. Is also status post right coronary angioplasty stent placement in 2010. He had postoperative atrial fibrillation and has been briefly on amiodarone. This has been discontinued. Last year had an echocardiogram and he had a 50 mm mercury gradient across aortic valve. He status post tissue valve replacement. He reports no chest pain or shortness of breath. He has no orthopnea PND he has no palpitations or syncope. Is doing well from a cardiovascular standpoint.   PMH: reviewed and listed in Problem List in Electronic Records (and see below) Past Medical History  Diagnosis Date  . OSA on CPAP 2006  . NIDDM (non-insulin dependent diabetes mellitus)   . Diastolic dysfunction 8/09  . Aortic stenosis 8/09    Bicuspid aortic valve  . CTS (carpal tunnel syndrome)     bilateral   . Hypertension   . Sinusitis   . Nasal congestion   . Hyperlipidemia   . Vitamin d deficiency 06/13/09  . Arthritis 6/10    hand   . Kidney stones   . Paresthesias in left hand 02/19/2004  . Paresthesias in right hand 02/19/2004  . CAD (coronary artery disease) 8/09  . Atrial fibrillation     post op only   Past Surgical History  Procedure Date  . Coronary angioplasty with stent placement     to rt coronary atery (Dr. Degert-cardiologist)  . Tissue aortic valve replacement   . Carpal tunnel release     bilateral     Allergies/SH/FHX : available in Electronic Records for review  Allergies  Allergen Reactions  . Hctz (Hydrochlorothiazide)     Nausea and headache   . Sulfa Antibiotics   . Sulfonamide Derivatives    History   Social History  . Marital Status: Married    Spouse  Name: N/A    Number of Children: N/A  . Years of Education: N/A   Occupational History  . Not on file.   Social History Main Topics  . Smoking status: Former Smoker -- 1.5 packs/day for 25 years    Types: Cigarettes    Quit date: 07/06/2002  . Smokeless tobacco: Never Used  . Alcohol Use: Not on file  . Drug Use: Not on file  . Sexually Active: Not on file   Other Topics Concern  . Not on file   Social History Narrative  . No narrative on file   Family History  Problem Relation Age of Onset  . Diabetes      Family History     Medications: Current Outpatient Prescriptions  Medication Sig Dispense Refill  . ANDROGEL PUMP 20.25 MG/ACT (1.62%) GEL Apply 2 each topically Daily. pumps      . aspirin (SB LOW DOSE ASA EC) 81 MG EC tablet Take 81 mg by mouth daily.        [name] atorvastatin (LIPITOR) 40 MG tablet Take 40 mg by mouth daily.        . cholecalciferol (VITAMIN D) 1000 UNITS tablet Take 2,000 Units by mouth daily.       . clopidogrel (PLAVIX) 75 MG tablet TAKE 1 TABLET ONCE DAILY  90 tablet  4  . glipiZIDE (GLUCOTROL) 5 MG tablet Take 5 mg  by mouth daily.        [name] lisinopril (PRINIVIL,ZESTRIL) 10 MG tablet Take 1 tablet by mouth Daily.      . metFORMIN (GLUCOPHAGE) 500 MG tablet Take 500 mg by mouth 2 (two) times daily with a meal.       . niacin (NIASPAN) 1000 MG CR tablet Take 1,000 mg by mouth at bedtime.        . temazepam (RESTORIL) 15 MG capsule Take 15 mg by mouth at bedtime as needed.          ROS: No nausea or vomiting. No fever or chills.No melena or hematochezia.No bleeding.No claudication  Physical Exam: BP 137/82  Pulse 87  Ht 5\' 10"  (1.778 m)  Wt 210 lb (95.255 kg)  BMI 30.13 kg/m2 General: Well-nourished white male in no distress. Neck: Multiple obstructive no carotid bruits. No thyromegaly nonnodular thyroid JVP is 6 cm Lungs: Clear breath sounds bilaterally no wheezing. Cardiac: Regular rate and rhythm with normal S1-S2 no murmur rubs or  gallops Vascular: No edema. Normal distal pulses Skin: Warm and dry  Physcologic: Normal affect  12lead ECG: Normal sinus rhythm with no acute ischemic changes Limited bedside ECHO:N/A No images are attached to the encounter.    Patient Active Problem List  Diagnoses  . DM  . HYPERLIPIDEMIA  . HYPOPOTASSEMIA  . HYPERTENSION  . CORONARY ARTERY DISEASE, S/P PTCA  . AORTIC VALVE DISORDERS-status post tissue valve replacement   . OSTEOARTHRITIS, LOCALIZED  . UNSPECIFIED SLEEP APNEA  . INSOMNIA  . ATRIAL FIBRILLATION, PAROXYSMAL, HX OF-postoperatively normal sinus rhythm     PLAN   Patient is doing well. He reports no shortness of breath orthopnea or PND.  His had no fever or chills.  Had an echocardiogram done a year ago showed normal valve function also normal valve function by physical examination.  The patient will be followed up in one year including an echocardiogram.

## 2012-05-28 ENCOUNTER — Other Ambulatory Visit: Payer: Self-pay | Admitting: Cardiology

## 2012-06-16 ENCOUNTER — Telehealth: Payer: Self-pay | Admitting: Cardiology

## 2012-06-16 DIAGNOSIS — I359 Nonrheumatic aortic valve disorder, unspecified: Secondary | ICD-10-CM

## 2012-06-16 NOTE — Telephone Encounter (Signed)
[name] [name] called stating that he was told he would need 2 D Echo before his office visit in March 2014. Left message that he will need Echo and to call the office . Will need order written.

## 2012-08-24 ENCOUNTER — Encounter: Payer: Self-pay | Admitting: Cardiology

## 2012-09-07 ENCOUNTER — Other Ambulatory Visit: Payer: Self-pay

## 2012-09-07 ENCOUNTER — Encounter: Payer: Self-pay | Admitting: *Deleted

## 2012-09-07 ENCOUNTER — Other Ambulatory Visit (INDEPENDENT_AMBULATORY_CARE_PROVIDER_SITE_OTHER): Payer: BC Managed Care – PPO

## 2012-09-07 DIAGNOSIS — I359 Nonrheumatic aortic valve disorder, unspecified: Secondary | ICD-10-CM

## 2012-09-21 ENCOUNTER — Ambulatory Visit (INDEPENDENT_AMBULATORY_CARE_PROVIDER_SITE_OTHER): Payer: BC Managed Care – PPO | Admitting: Cardiology

## 2012-09-21 ENCOUNTER — Encounter: Payer: Self-pay | Admitting: Cardiology

## 2012-09-21 VITALS — BP 145/81 | HR 82 | Ht 70.0 in | Wt 212.0 lb

## 2012-09-21 DIAGNOSIS — I359 Nonrheumatic aortic valve disorder, unspecified: Secondary | ICD-10-CM

## 2012-09-21 DIAGNOSIS — I714 Abdominal aortic aneurysm, without rupture: Secondary | ICD-10-CM

## 2012-09-21 DIAGNOSIS — I10 Essential (primary) hypertension: Secondary | ICD-10-CM

## 2012-09-21 DIAGNOSIS — I4891 Unspecified atrial fibrillation: Secondary | ICD-10-CM

## 2012-09-21 DIAGNOSIS — Z8679 Personal history of other diseases of the circulatory system: Secondary | ICD-10-CM

## 2012-09-21 DIAGNOSIS — I259 Chronic ischemic heart disease, unspecified: Secondary | ICD-10-CM

## 2012-09-21 DIAGNOSIS — E785 Hyperlipidemia, unspecified: Secondary | ICD-10-CM

## 2012-09-21 MED ORDER — LISINOPRIL 20 MG PO TABS: 20.0000 mg | ORAL_TABLET | Freq: Every day | ORAL | Status: DC

## 2012-09-21 NOTE — Patient Instructions (Addendum)
   Stop Plavix  Increase Lisinopril to 20mg  daily (may take 2 tabs of the 10mg  tablet till finish current supply, then change to 20mg  tablet)  - new 90 day supply sent to [facility]   Lab for BMET - due in 2 weeks (around 10/05/2012)  MRA of chest   Office will contact with results Your physician wants you to follow up in:  1 year.  You will receive a reminder letter in the mail one-two months in advance.  If you don't receive a letter, please call our office to schedule the follow up appointment

## 2012-09-21 NOTE — Progress Notes (Signed)
Patient ID: [name] [name], male   DOB: Jul 12, 1955, 57 y.o.   MRN: [id number] PCP: Dr. Christell Constant  57 yo with history of bicuspid aortic valve and CAD presents for cardiology followup.  He had bioprosthetic aortic valve replacement in 2012 complicated by post-op atrial fibrillation.  Recently, he has been doing well.  No exertional chest pain or dyspnea.  He walks for exercise.  No tachypalpitations.  Echo earlier this month showed preserved EF and normal bioprosthetic valve.  BP has been running high, typically in the 140s at home.    ECG: NSR, normal  Labs (2/14): K 4.1, creatinine 0.86  PMH: 1. Bicuspid aortic valve with severe AS.  Status post bioprosthetic AVR in 2012.  Echo (3/14) with EF 60%, mild LVH, bioprosthetic AVR with mean gradient 9 mmHg, normal RV size with mildly decreased systolic function.  2. Post-operative atrial fibrillation (after AVR).  3. CAD: PCI to RCA in 12/10 (Xience V DES).  LHC in 2012 prior to AVR showed nonobstructive CAD.  4. OSA on CPAP 5. Hyperlipidemia 6. Type II diabetes 7. Nephrolithiasis 8. HTN  SH: Married, 3 children, lives in [place], Airline pilot for [employer], prior smoker.   FH: Parents with CABG  ROS: All systems reviewed and negative except as per HPI.   Current Outpatient Prescriptions  Medication Sig Dispense Refill  . ANDROGEL PUMP 20.25 MG/ACT (1.62%) GEL Apply 2 each topically Daily. pumps      . aspirin (SB LOW DOSE ASA EC) 81 MG EC tablet Take 81 mg by mouth daily.        [name] atorvastatin (LIPITOR) 40 MG tablet Take 40 mg by mouth daily.        . cholecalciferol (VITAMIN D) 1000 UNITS tablet Take 2,000 Units by mouth daily.       [name] glipiZIDE (GLUCOTROL) 5 MG tablet Take 5 mg by mouth daily.        [name] lisinopril (PRINIVIL,ZESTRIL) 20 MG tablet Take 1 tablet (20 mg total) by mouth daily.  90 tablet  3  . metFORMIN (GLUCOPHAGE) 500 MG tablet Take 500 mg by mouth 2 (two) times daily with a meal.       . niacin (NIASPAN) 1000 MG CR tablet Take 1,000 mg by  mouth at bedtime.        . temazepam (RESTORIL) 15 MG capsule Take 15 mg by mouth at bedtime as needed.         No current facility-administered medications for this visit.   BP 145/81  Pulse 82  Ht 5\' 10"  (1.778 m)  Wt 212 lb (96.163 kg)  BMI 30.42 kg/m2 General: NAD Neck: No JVD, no thyromegaly or thyroid nodule.  Lungs: Clear to auscultation bilaterally with normal respiratory effort. CV: Nondisplaced PMI.  Heart regular S1/S2, no S3/S4, 2/6 early SEM RUSB.  No peripheral edema.  No carotid bruit.  Normal pedal pulses.  Abdomen: Soft, nontender, no hepatosplenomegaly, no distention.  Neurologic: Alert and oriented x 3.  Psych: Normal affect. Extremities: No clubbing or cyanosis.   Assessment/Plan: 1. Bicuspid aortic valve disorder: s/p Bioprosthetic AVR.  Valve appeared normal on recent echo.  Thoracic aorta aneurysm can be associated with bicuspid aortic valve.  I cannot find that Mr Lovvorn has had screening for thoracic aortic aneurysm.  I will get an MRA of his chest to screen for aneurysm.  2. HTN: Increase lisinopril to 20 mg daily with BMET in 2 wks.  3. Hyperlipidemia: Continue statin.  I will get most recent lipids  from PCP.  4. CAD: Stable with no ischemic symptoms.  At this point, I think he can stop Plavix.  Continue ASA, statin, ACEI.  5. Paroxysmal atrial fibrillation: This has not been noted since AVR surgery.  6. Patient asks about testosterone.  If his level was significantly low, reasonable to use.  If it was low normal or only mildly decreased, would consider holding off on testosterone given the small but real cardiac risk.   Marca Ancona 09/21/2012

## 2012-09-26 ENCOUNTER — Telehealth: Payer: Self-pay | Admitting: Cardiology

## 2012-09-26 ENCOUNTER — Other Ambulatory Visit: Payer: Self-pay | Admitting: *Deleted

## 2012-09-26 DIAGNOSIS — I714 Abdominal aortic aneurysm, without rupture: Secondary | ICD-10-CM

## 2012-09-26 NOTE — Telephone Encounter (Signed)
MRSA Chest w & wo contrast (patient is diabetic)  Diagnosis: 441.4, 401.9, 414.9, 424.1  Friday, April 4th, 2014 @ [facility]   Checking percert

## 2012-10-05 NOTE — Telephone Encounter (Signed)
Auth # [id number] exp 11/03/12

## 2012-10-07 ENCOUNTER — Encounter: Payer: Self-pay | Admitting: *Deleted

## 2012-10-12 ENCOUNTER — Telehealth: Payer: Self-pay | Admitting: *Deleted

## 2012-10-12 NOTE — Telephone Encounter (Signed)
Message copied by Lesle Chris on Wed Oct 12, 2012  3:51 PM ------      Message from: [facility], [place]      Created: Tue Oct 11, 2012 10:14 PM       Stable 4 cm aneurysm ------

## 2012-10-12 NOTE — Telephone Encounter (Signed)
Notes Recorded by Lesle Chris, LPN on 11/08/2128 at 3:51 PM Patient notified and verbalized understanding.

## 2012-11-04 ENCOUNTER — Other Ambulatory Visit: Payer: Self-pay

## 2012-11-04 MED ORDER — NIACIN ER (ANTIHYPERLIPIDEMIC) 1000 MG PO TBCR: 1000.0000 mg | EXTENDED_RELEASE_TABLET | Freq: Every day | ORAL | Status: DC

## 2012-11-04 NOTE — Telephone Encounter (Signed)
Call pt to pick up.

## 2012-11-04 NOTE — Telephone Encounter (Signed)
Last seen 08/24/12   Lipids 08/24/12    Print and have nurse call patient to pick up for mail order

## 2012-11-08 ENCOUNTER — Telehealth: Payer: Self-pay | Admitting: Family Medicine

## 2012-11-08 NOTE — Telephone Encounter (Signed)
Left message on patient's voicemail to pick up his prescription.

## 2012-11-09 NOTE — Telephone Encounter (Signed)
Patient aware.

## 2012-11-09 NOTE — Telephone Encounter (Signed)
Please advise 

## 2012-11-09 NOTE — Telephone Encounter (Signed)
Generic Niaspan is better than generic niacin

## 2012-12-05 ENCOUNTER — Other Ambulatory Visit (INDEPENDENT_AMBULATORY_CARE_PROVIDER_SITE_OTHER): Payer: BC Managed Care – PPO

## 2012-12-05 DIAGNOSIS — E785 Hyperlipidemia, unspecified: Secondary | ICD-10-CM

## 2012-12-05 DIAGNOSIS — E291 Testicular hypofunction: Secondary | ICD-10-CM

## 2012-12-05 DIAGNOSIS — R7989 Other specified abnormal findings of blood chemistry: Secondary | ICD-10-CM

## 2012-12-05 DIAGNOSIS — Z125 Encounter for screening for malignant neoplasm of prostate: Secondary | ICD-10-CM

## 2012-12-05 DIAGNOSIS — R5381 Other malaise: Secondary | ICD-10-CM

## 2012-12-05 DIAGNOSIS — I10 Essential (primary) hypertension: Secondary | ICD-10-CM

## 2012-12-05 DIAGNOSIS — R5383 Other fatigue: Secondary | ICD-10-CM

## 2012-12-05 LAB — POCT CBC
Granulocyte percent: 71.7 %G (ref 37–80)
Lymph, poc: 1.6 (ref 0.6–3.4)
MCV: 95.2 fL (ref 80–97)
MPV: 7.3 fL (ref 0–99.8)
POC Granulocyte: 5.2 (ref 2–6.9)
Platelet Count, POC: 159 10*3/uL (ref 142–424)
RBC: 4.5 M/uL — AB (ref 4.69–6.13)

## 2012-12-05 LAB — HEPATIC FUNCTION PANEL
Alkaline Phosphatase: 61 U/L (ref 39–117)
Bilirubin, Direct: 0.2 mg/dL (ref 0.0–0.3)
Indirect Bilirubin: 0.5 mg/dL (ref 0.0–0.9)
Total Bilirubin: 0.7 mg/dL (ref 0.3–1.2)

## 2012-12-05 LAB — BASIC METABOLIC PANEL WITH GFR
Chloride: 104 mEq/L (ref 96–112)
GFR, Est African American: >89 mL/min
GFR, Est Non African American: >89 mL/min
Potassium: 4.3 mEq/L (ref 3.5–5.3)
Sodium: 142 mEq/L (ref 135–145)

## 2012-12-05 NOTE — Progress Notes (Signed)
Patient came in for labs only.

## 2012-12-06 LAB — NMR LIPOPROFILE WITH LIPIDS
HDL Particle Number: 31.9 umol/L (ref >=30.5)
HDL-C: 36 mg/dL — ABNORMAL LOW (ref >=40)
LDL (calc): 31 mg/dL (ref <100)
LP-IR Score: 54 — ABNORMAL HIGH (ref <=45)
Large HDL-P: <1.3 umol/L — ABNORMAL LOW (ref >=4.8)
Triglycerides: 43 mg/dL (ref <150)

## 2012-12-07 LAB — TESTOSTERONE, TOTAL AND FREE DIRECT MEASURE
Free Testosterone, Direct: 3.2 pg/mL — ABNORMAL LOW (ref 3.8–34.2)
Testosterone: 406 ng/dL (ref 300–890)

## 2012-12-12 ENCOUNTER — Encounter: Payer: Self-pay | Admitting: *Deleted

## 2012-12-12 ENCOUNTER — Ambulatory Visit (INDEPENDENT_AMBULATORY_CARE_PROVIDER_SITE_OTHER): Payer: BC Managed Care – PPO | Admitting: Family Medicine

## 2012-12-12 ENCOUNTER — Ambulatory Visit (INDEPENDENT_AMBULATORY_CARE_PROVIDER_SITE_OTHER): Payer: BC Managed Care – PPO | Admitting: Cardiology

## 2012-12-12 ENCOUNTER — Encounter: Payer: Self-pay | Admitting: Family Medicine

## 2012-12-12 ENCOUNTER — Encounter: Payer: Self-pay | Admitting: Cardiology

## 2012-12-12 VITALS — BP 142/77 | HR 86 | Temp 98.4°F | Ht 70.0 in | Wt 212.6 lb

## 2012-12-12 VITALS — BP 153/93 | HR 79 | Ht 70.0 in | Wt 211.0 lb

## 2012-12-12 DIAGNOSIS — E119 Type 2 diabetes mellitus without complications: Secondary | ICD-10-CM

## 2012-12-12 DIAGNOSIS — M1990 Unspecified osteoarthritis, unspecified site: Secondary | ICD-10-CM

## 2012-12-12 DIAGNOSIS — I10 Essential (primary) hypertension: Secondary | ICD-10-CM

## 2012-12-12 DIAGNOSIS — I259 Chronic ischemic heart disease, unspecified: Secondary | ICD-10-CM

## 2012-12-12 DIAGNOSIS — I712 Thoracic aortic aneurysm, without rupture: Secondary | ICD-10-CM

## 2012-12-12 DIAGNOSIS — R079 Chest pain, unspecified: Secondary | ICD-10-CM

## 2012-12-12 DIAGNOSIS — Z8679 Personal history of other diseases of the circulatory system: Secondary | ICD-10-CM

## 2012-12-12 DIAGNOSIS — E785 Hyperlipidemia, unspecified: Secondary | ICD-10-CM

## 2012-12-12 MED ORDER — TESTOSTERONE 20.25 MG/ACT (1.62%) TD GEL: 3.0000 | Freq: Every day | TRANSDERMAL | Status: DC

## 2012-12-12 MED ORDER — FLUTICASONE PROPIONATE 50 MCG/ACT NA SUSP: 2.0000 | Freq: Every day | NASAL | Status: DC

## 2012-12-12 NOTE — Progress Notes (Signed)
HPI The patient was added to my schedule today to discuss findings of her recent MRI. He was concerned about an aneurysm he has. This is aortic root dilatation related to his previous aortic valve stenosis with a bicuspid valve. He is status post AVR. Recent MRI demonstrated this to be stable in size. He was confused because he didn't know about it prior to his surgery. I verified that it was there at the time of surgery. Again it is unchanged.  In addition he's been having some chest discomfort with walking. However, walking has been a new activity for him. He was previously not getting this but he was not doing this. He might get a little discomfort when he gets to the peak of his walk. It is midsternal it goes away quickly when he stops what he is doing. It is mild. There are no associated symptoms. He denies any excessive shortness of breath, PND or orthopnea. He has no palpitations, presyncope or syncope.  Allergies  Allergen Reactions  . Hctz (Hydrochlorothiazide)     Nausea and headache   . Sulfa Antibiotics   . Sulfonamide Derivatives     Current Outpatient Prescriptions  Medication Sig Dispense Refill  . aspirin (SB LOW DOSE ASA EC) 81 MG EC tablet Take 81 mg by mouth daily.        [name] atorvastatin (LIPITOR) 40 MG tablet Take 40 mg by mouth daily.        . cholecalciferol (VITAMIN D) 1000 UNITS tablet Take 2,000 Units by mouth daily.       . fluticasone (FLONASE) 50 MCG/ACT nasal spray Place 2 sprays into the nose daily.  16 g  6  . glipiZIDE (GLUCOTROL) 5 MG tablet Take 5 mg by mouth daily.        [name] lisinopril (PRINIVIL,ZESTRIL) 20 MG tablet Take 1 tablet (20 mg total) by mouth daily.  90 tablet  3  . metFORMIN (GLUCOPHAGE) 500 MG tablet Take 500 mg by mouth 2 (two) times daily with a meal.       . niacin (NIASPAN) 1000 MG CR tablet Take 1 tablet (1,000 mg total) by mouth at bedtime.  90 tablet  0  . temazepam (RESTORIL) 15 MG capsule Take 15 mg by mouth at bedtime as needed.         . Testosterone (ANDROGEL PUMP) 20.25 MG/ACT (1.62%) GEL Apply 3 each topically daily. pumps  75 g  3   No current facility-administered medications for this visit.    Past Medical History  Diagnosis Date  . OSA on CPAP 2006  . NIDDM (non-insulin dependent diabetes mellitus)   . Diastolic dysfunction 8/09  . Aortic stenosis 8/09    Bicuspid aortic valve  . CTS (carpal tunnel syndrome)     bilateral   . Hypertension   . Sinusitis   . Nasal congestion   . Hyperlipidemia   . Vitamin D deficiency 06/13/09  . Arthritis 6/10    hand   . Kidney stones   . Paresthesias in left hand 02/19/2004  . Paresthesias in right hand 02/19/2004  . CAD (coronary artery disease) 8/09  . Atrial fibrillation     post op only    Past Surgical History  Procedure Laterality Date  . Coronary angioplasty with stent placement      to rt coronary atery (Dr. Degert-cardiologist)  . Tissue aortic valve replacement    . Carpal tunnel release      bilateral     ROS:  As stated in the HPI and negative for all other systems.  PHYSICAL EXAM BP 153/93  Pulse 79  Ht 5\' 10"  (1.778 m)  Wt 211 lb (95.709 kg)  BMI 30.28 kg/m2 GENERAL:  Well appearing HEENT:  Pupils equal round and reactive, fundi not visualized, oral mucosa unremarkable NECK:  No jugular venous distention, waveform within normal limits, carotid upstroke brisk and symmetric, no bruits, no thyromegaly LYMPHATICS:  No cervical, inguinal adenopathy LUNGS:  Clear to auscultation bilaterally BACK:  No CVA tenderness CHEST:Well healed sternotomy scar. HEART:  PMI not displaced or sustained,S1 and S2 within normal limits, no S3, no S4, no clicks, no rubs, no murmurs ABD:  Flat, positive bowel sounds normal in frequency in pitch, no bruits, no rebound, no guarding, no midline pulsatile mass, no hepatomegaly, no splenomegaly EXT:  2 plus pulses throughout, no edema, no cyanosis no clubbing SKIN:  No rashes no nodules NEURO:  Cranial nerves II  through XII grossly intact, motor grossly intact throughout PSYCH:  Cognitively intact, oriented to person place and time   ASSESSMENT AND PLAN  Bicuspid aortic valve disorder: s/p Bioprosthetic AVR.  I reviewed with him the recent MRI which has a 4 cm aortic root dilatation but this is stable. I said this was related to the previous bicuspid valve and therefore should remain stable but will be followed.   HTN: his blood pressure is elevated today but he did not take a dose of his medications and he says he is usually well controlled.  Hyperlipidemia: Per Dr. Christell Constant  CAD: I reviewed his catheterization from 2012. He had mild nonobstructive plaque. However, given the ongoing symptoms I will screen him with stress testing. I will bring the patient back for a POET (Plain Old Exercise Test). This will allow me to screen for obstructive coronary disease.

## 2012-12-12 NOTE — Addendum Note (Signed)
Addended by: Bearl Mulberry on: 12/12/2012 03:27 PM   Modules accepted: Orders

## 2012-12-12 NOTE — Patient Instructions (Addendum)
Your physician recommends that you schedule a follow-up appointment as scheduled with Dr. Shirlee Latch. Your physician recommends that you continue on your current medications as directed. Please refer to the Current Medication list given to you today. Your physician has requested that you have an exercise tolerance test. For further information please visit [email or web address] Please also follow instruction sheet, as given.

## 2012-12-12 NOTE — Patient Instructions (Addendum)
Continue current meds and therapeutic lifestyle changes Avoid nonsteroidal anti-inflammatory medication Watch sodium intake closely If you do not hear from Dr. Kemper Durie office within the next 7-10 days please call us back

## 2012-12-12 NOTE — Progress Notes (Signed)
  Subjective:    Patient ID: [name] [name], male    DOB: [birth year], 57 y.o.   MRN: [id number]  HPI Patient comes in today for followup of chronic medical problems. He had some questions about [employer]. These were hopefully answered. He also indicated that he was having some chest tightness with walking about a half of a mile. He did not have this this past summer. He also indicated that he had an MRI which showed an aortic aneurysm and there were questions about this and what he could do to keep it from getting larger. He says his allergies are seasonal both spring and fall.   Review of Systems  HENT: Positive for postnasal drip (due to allergies).   Eyes: Negative.   Respiratory: Positive for cough (dry), chest tightness (when exerted) and shortness of breath (with exertion).   Cardiovascular: Negative.   Gastrointestinal: Negative.   Endocrine: Negative.   Genitourinary: Positive for frequency. Negative for dysuria.  Musculoskeletal: Positive for arthralgias (bilateral hands, L wrist).  Skin: Negative.   Allergic/Immunologic: Positive for environmental allergies (seasonal).  Neurological: Negative.   Psychiatric/Behavioral: Positive for sleep disturbance (due to anxiety). The patient is nervous/anxious (at bedtime).        Objective:   Physical Exam BP 142/77  Pulse 86  Temp(Src) 98.4 F (36.9 C) (Oral)  Ht 5\' 10"  (1.778 m)  Wt 212 lb 9.6 oz (96.435 kg)  BMI 30.51 kg/m2  The patient appeared well nourished and normally developed, alert and oriented to time and place. Speech, behavior and judgement appear normal. He did same slightly anxious. About his health condition Vital signs as documented.  Head exam is unremarkable. No scleral icterus or pallor noted. There is some nasal congestion bilaterally. Throat and ears were normal. Neck is without jugular venous distension, thyromegally, or carotid bruits. Carotid upstrokes are brisk bilaterally. No cervical adenopathy. Lungs are  clear anteriorly and posteriorly to auscultation. Normal respiratory effort. There is no axillary adenopathy. Cardiac exam reveals regular rate and rhythm at 72 per minute there. First and second heart sounds normal. No murmurs, rubs or gallops.  Abdominal exam reveals normal bowl sounds, no masses, no organomegaly and no aortic enlargement. No inguinal adenopathy. Extremities are nonedematous and both femoral and pedal pulses are normal. Skin without pallor or jaundice.  Warm and dry, without rash. Neurologic exam reveals normal deep tendon reflexes and normal sensation. Feet were examined and they appeared normal with good pulses appear          Assessment & Plan:   1. HYPERLIPIDEMIA  2. HYPERTENSION  3. OSTEOARTHRITIS, LOCALIZED  4. CORONARY ARTERY DISEASE, S/P PTCA  5. Ascending aortic aneurysm  6. DM  Patient Instructions  Continue current meds and therapeutic lifestyle changes Avoid nonsteroidal anti-inflammatory medication Watch sodium intake closely If you do not hear from Dr. Kemper Durie office within the next 7-10 days please call us back

## 2012-12-13 ENCOUNTER — Other Ambulatory Visit: Payer: Self-pay | Admitting: *Deleted

## 2012-12-13 DIAGNOSIS — Z8679 Personal history of other diseases of the circulatory system: Secondary | ICD-10-CM

## 2012-12-13 DIAGNOSIS — R079 Chest pain, unspecified: Secondary | ICD-10-CM

## 2012-12-15 DIAGNOSIS — R072 Precordial pain: Secondary | ICD-10-CM

## 2012-12-19 ENCOUNTER — Telehealth: Payer: Self-pay | Admitting: *Deleted

## 2012-12-19 NOTE — Addendum Note (Signed)
Addended by: Orma Render F on: 12/19/2012 03:08 PM   Modules accepted: Orders

## 2012-12-19 NOTE — Telephone Encounter (Signed)
Message copied by Eustace Mcclean on Mon Dec 19, 2012 10:02 AM ------      Message from: Rollene Rotunda      Created: Fri Dec 16, 2012  1:41 PM       No evidence of ischemia.  No further work up.  Call Mr. Provencio with the results and send results to Rudi Heap, MD       ------

## 2012-12-19 NOTE — Telephone Encounter (Signed)
Patient informed. Copy sent to PCP °

## 2013-02-23 ENCOUNTER — Telehealth: Payer: Self-pay | Admitting: Family Medicine

## 2013-02-25 ENCOUNTER — Ambulatory Visit (INDEPENDENT_AMBULATORY_CARE_PROVIDER_SITE_OTHER): Payer: BC Managed Care – PPO | Admitting: Family Medicine

## 2013-02-25 VITALS — BP 126/70 | HR 79 | Temp 98.2°F | Ht 70.0 in | Wt 217.0 lb

## 2013-02-25 DIAGNOSIS — J329 Chronic sinusitis, unspecified: Secondary | ICD-10-CM

## 2013-02-25 MED ORDER — AMOXICILLIN-POT CLAVULANATE 875-125 MG PO TABS: 1.0000 | ORAL_TABLET | Freq: Two times a day (BID) | ORAL | Status: DC

## 2013-02-25 NOTE — Progress Notes (Signed)
  Subjective:    Patient ID: [name] [name], male    DOB: 09-24-1955, 57 y.o.   MRN: [id number]  HPI This 57 y.o. male presents for evaluation of sinus pressure and mucopurulent sinus discharge. He has been feeling washed and tired.   Review of Systems C/o uri sx's No chest pain, SOB, HA, dizziness, vision change, N/V, diarrhea, constipation, dysuria, urinary urgency or frequency, myalgias, arthralgias or rash.     Objective:   Physical Exam  Vital signs noted  Well developed well nourished male.  HEENT - Head atraumatic Normocephalic                Eyes - PERRLA, Conjuctiva - clear Sclera- Clear EOMI                Ears - EAC's Wnl TM's Wnl Gross Hearing WNL                Nose - Nares patent                 Throat - oropharanx wnl Respiratory - Lungs CTA bilateral Cardiac - RRR S1 and S2 without murmur GI - Abdomen soft Nontender and bowel sounds active x 4 Extremities - No edema. Neuro - Grossly intact.      Assessment & Plan:  Sinusitis - Plan: amoxicillin-clavulanate (AUGMENTIN) 875-125 MG per tablet One po bid x 14 days #28, push po fluids, rest, and continue otc cough and cold meds.

## 2013-02-25 NOTE — Patient Instructions (Signed)

## 2013-04-12 ENCOUNTER — Ambulatory Visit (INDEPENDENT_AMBULATORY_CARE_PROVIDER_SITE_OTHER): Payer: BC Managed Care – PPO | Admitting: Family Medicine

## 2013-04-12 ENCOUNTER — Encounter: Payer: Self-pay | Admitting: Family Medicine

## 2013-04-12 VITALS — BP 133/76 | HR 86 | Temp 100.0°F | Ht 70.0 in | Wt 210.0 lb

## 2013-04-12 DIAGNOSIS — E785 Hyperlipidemia, unspecified: Secondary | ICD-10-CM

## 2013-04-12 DIAGNOSIS — L570 Actinic keratosis: Secondary | ICD-10-CM

## 2013-04-12 DIAGNOSIS — E349 Endocrine disorder, unspecified: Secondary | ICD-10-CM

## 2013-04-12 DIAGNOSIS — M1990 Unspecified osteoarthritis, unspecified site: Secondary | ICD-10-CM

## 2013-04-12 DIAGNOSIS — E559 Vitamin D deficiency, unspecified: Secondary | ICD-10-CM

## 2013-04-12 DIAGNOSIS — I10 Essential (primary) hypertension: Secondary | ICD-10-CM

## 2013-04-12 DIAGNOSIS — Z8679 Personal history of other diseases of the circulatory system: Secondary | ICD-10-CM

## 2013-04-12 DIAGNOSIS — E119 Type 2 diabetes mellitus without complications: Secondary | ICD-10-CM

## 2013-04-12 DIAGNOSIS — E291 Testicular hypofunction: Secondary | ICD-10-CM

## 2013-04-12 LAB — POCT CBC
HCT, POC: 42.1 % — AB (ref 43.5–53.7)
Hemoglobin: 14.2 g/dL (ref 14.1–18.1)
MCH, POC: 32.4 pg — AB (ref 27–31.2)
MCHC: 33.8 g/dL (ref 31.8–35.4)
MCV: 95.8 fL (ref 80–97)
POC Granulocyte: 4.4 (ref 2–6.9)
Platelet Count, POC: 179 10*3/uL (ref 142–424)

## 2013-04-12 LAB — POCT GLYCOSYLATED HEMOGLOBIN (HGB A1C): Hemoglobin A1C: 6.6

## 2013-04-12 MED ORDER — METFORMIN HCL 500 MG PO TABS: 500.0000 mg | ORAL_TABLET | Freq: Two times a day (BID) | ORAL | Status: DC

## 2013-04-12 MED ORDER — NIACIN ER (ANTIHYPERLIPIDEMIC) 1000 MG PO TBCR: 1000.0000 mg | EXTENDED_RELEASE_TABLET | Freq: Every day | ORAL | Status: DC

## 2013-04-12 MED ORDER — ATORVASTATIN CALCIUM 40 MG PO TABS: 40.0000 mg | ORAL_TABLET | Freq: Every day | ORAL | Status: DC

## 2013-04-12 MED ORDER — TESTOSTERONE 20.25 MG/ACT (1.62%) TD GEL: 3.0000 | Freq: Every day | TRANSDERMAL | Status: DC

## 2013-04-12 NOTE — Patient Instructions (Addendum)
Continue current medications. Continue good therapeutic lifestyle changes.  Fall precautions discussed with patient. Schedule your flu vaccine in October. Also you will need to get a Prevnar, this shot was explained to you during the visit today Follow up as planned and earlier as needed.  Continue to check blood sugars regularly, try to check some blood sugars fasting in the morning Monitor blood pressures at home Because of the recent intestinal virus try to drink more fluids over the next few days, avoid milk cheese ice cream and dairy products, and avoid caffeine. The scalp lesion was frozen with liquid nitrogen and may form a small water blister and subsequently rupture and scab over. If it does not go away he will need to come back for removal

## 2013-04-12 NOTE — Progress Notes (Signed)
Subjective:    Patient ID: [name] [name], male    DOB: [birth year], 57 y.o.   MRN: [id number]  HPI Pt here for follow up and management of chronic medical problems. See the review of systems as he is head congestion and sinus drainage and some fever. The drainage is clear and he says that his ears have been popping and he has had diminished hearing. He asked he says he's had this congestion for several months. He indicates he had an intestinal virus over the weekend with nausea and vomiting and that that started getting somewhat better as of yesterday. His home blood sugars before dinner have been running about 115-130. His blood pressures at home have been in the 130s over the 70s. He also complains of a papule of dry skin on his superior scalp.     Patient Active Problem List   Diagnosis Date Noted  . Ascending aortic aneurysm 12/12/2012  . INSOMNIA 09/15/2010  . ATRIAL FIBRILLATION, PAROXYSMAL, HX OF 09/15/2010  . CORONARY ARTERY DISEASE, S/P PTCA 07/09/2009  . AORTIC VALVE DISORDERS 05/27/2009  . OSTEOARTHRITIS, LOCALIZED 05/27/2009  . DM 05/24/2009  . HYPERLIPIDEMIA 05/24/2009  . HYPOPOTASSEMIA 05/24/2009  . HYPERTENSION 05/24/2009  . UNSPECIFIED SLEEP APNEA 05/24/2009   Outpatient Encounter Prescriptions as of 04/12/2013  Medication Sig Dispense Refill  . aspirin (SB LOW DOSE ASA EC) 81 MG EC tablet Take 81 mg by mouth daily.        [name] atorvastatin (LIPITOR) 40 MG tablet Take 40 mg by mouth daily.        . cholecalciferol (VITAMIN D) 1000 UNITS tablet Take 2,000 Units by mouth daily.       . fluticasone (FLONASE) 50 MCG/ACT nasal spray Place 2 sprays into the nose daily.  16 g  6  . glipiZIDE (GLUCOTROL) 5 MG tablet Take 5 mg by mouth daily.        [name] lisinopril (PRINIVIL,ZESTRIL) 20 MG tablet Take 1 tablet (20 mg total) by mouth daily.  90 tablet  3  . metFORMIN (GLUCOPHAGE) 500 MG tablet Take 500 mg by mouth 2 (two) times daily with a meal.       . niacin (NIASPAN) 1000 MG CR  tablet Take 1 tablet (1,000 mg total) by mouth at bedtime.  90 tablet  0  . temazepam (RESTORIL) 15 MG capsule Take 15 mg by mouth at bedtime as needed.        . Testosterone (ANDROGEL PUMP) 20.25 MG/ACT (1.62%) GEL Apply 3 each topically daily. pumps  75 g  3  . [DISCONTINUED] amoxicillin-clavulanate (AUGMENTIN) 875-125 MG per tablet Take 1 tablet by mouth 2 (two) times daily.  28 tablet  0   No facility-administered encounter medications on file as of 04/12/2013.    Review of Systems  Constitutional: Positive for fever and fatigue.  HENT: Positive for congestion, postnasal drip and sinus pressure.   Eyes: Negative.   Respiratory: Negative.  Negative for cough and shortness of breath.   Cardiovascular: Negative.   Gastrointestinal: Negative.   Endocrine: Negative.   Genitourinary: Negative.   Musculoskeletal: Negative.   Skin: Negative.   Allergic/Immunologic: Negative.   Neurological: Negative.  Negative for headaches.  Hematological: Negative.   Psychiatric/Behavioral: Negative.        Objective:   Physical Exam  Nursing note and vitals reviewed. Constitutional: He is oriented to person, place, and time. He appears well-developed and well-nourished. No distress.  HENT:  Head: Normocephalic and atraumatic.  Right Ear: External ear  normal.  Left Ear: External ear normal.  Mouth/Throat: Oropharynx is clear and moist. No oropharyngeal exudate.  Both ear canals were clear and TMs were normal. There was bilateral nasal congestion  Eyes: Conjunctivae and EOM are normal. Pupils are equal, round, and reactive to light. Right eye exhibits no discharge. Left eye exhibits no discharge. No scleral icterus.  Neck: Normal range of motion. Neck supple. No thyromegaly present.  There were no bruits in the neck  Cardiovascular: Normal rate, normal heart sounds and intact distal pulses.  Exam reveals no gallop and no friction rub.   No murmur heard. The heart was about 72 per minute with  occasional PVC  Pulmonary/Chest: Effort normal and breath sounds normal. No respiratory distress. He has no wheezes. He has no rales.  Abdominal: Soft. Bowel sounds are normal. He exhibits no distension and no mass. There is no tenderness. There is no rebound and no guarding.  Genitourinary: Rectum normal and penis normal.  The prostate was slightly enlarged. There were no lumps or nodules. There were no rectal masses. And there was no inguinal hernia palpated.  Musculoskeletal: Normal range of motion. He exhibits no edema and no tenderness.  Lymphadenopathy:    He has no cervical adenopathy.  Neurological: He is alert and oriented to person, place, and time. He has normal reflexes. No cranial nerve deficit.  Skin: Skin is warm and dry. No rash noted. He is not diaphoretic. No erythema. No pallor.  There is a dry papular type lesion on his superior occiput.  Psychiatric: He has a normal mood and affect. His behavior is normal. Judgment and thought content normal.   BP 133/76  Pulse 86  Temp(Src) 100 F (37.8 C) (Oral)  Ht 5\' 10"  (1.778 m)  Wt 210 lb (95.255 kg)  BMI 30.13 kg/m2 A. diabetic foot exam was done. The superior scalp lesion was treated with cryotherapy after explaining the procedure to the patient. He tolerated the procedure well       Assessment & Plan:   1. DM   2. HYPERLIPIDEMIA   3. HYPERTENSION   4. OSTEOARTHRITIS, LOCALIZED   5. ATRIAL FIBRILLATION, PAROXYSMAL, HX OF   6. Vitamin D deficiency   7. Testosterone deficiency   8. Actinic keratoses    Orders Placed This Encounter  Procedures  . Testosterone,Free and Total  . PSA, total and free  . Hepatic function panel  . BMP8+EGFR  . NMR, lipoprofile  . Vit D  25 hydroxy (rtn osteoporosis monitoring)  . POCT CBC  . POCT glycosylated hemoglobin (Hb A1C)  . POCT urinalysis dipstick  . POCT UA - Microscopic Only  . POCT UA - Microalbumin   Meds ordered this encounter  Medications  . Testosterone  (ANDROGEL PUMP) 20.25 MG/ACT (1.62%) GEL    Sig: Apply 3 each topically daily. pumps    Dispense:  75 g    Refill:  3  . metFORMIN (GLUCOPHAGE) 500 MG tablet    Sig: Take 1 tablet (500 mg total) by mouth 2 (two) times daily with a meal.    Dispense:  180 tablet    Refill:  3  . atorvastatin (LIPITOR) 40 MG tablet    Sig: Take 1 tablet (40 mg total) by mouth daily.    Dispense:  90 tablet    Refill:  3  . niacin (NIASPAN) 1000 MG CR tablet    Sig: Take 1 tablet (1,000 mg total) by mouth at bedtime.    Dispense:  90 tablet  Refill:  0   Patient Instructions  Continue current medications. Continue good therapeutic lifestyle changes.  Fall precautions discussed with patient. Schedule your flu vaccine in October. Also you will need to get a Prevnar, this shot was explained to you during the visit today Follow up as planned and earlier as needed.  Continue to check blood sugars regularly, try to check some blood sugars fasting in the morning Monitor blood pressures at home Because of the recent intestinal virus try to drink more fluids over the next few days, avoid milk cheese ice cream and dairy products, and avoid caffeine. The scalp lesion was frozen with liquid nitrogen and may form a small water blister and subsequently rupture and scab over. If it does not go away he will need to come back for removal     Nyra Capes MD

## 2013-04-15 LAB — BMP8+EGFR
BUN/Creatinine Ratio: 18 (ref 9–20)
Calcium: 9.3 mg/dL (ref 8.7–10.2)
Chloride: 101 mmol/L (ref 97–108)
GFR calc Af Amer: 112 mL/min/{1.73_m2} (ref >59)
GFR calc non Af Amer: 97 mL/min/{1.73_m2} (ref >59)
Glucose: 118 mg/dL — ABNORMAL HIGH (ref 65–99)
Potassium: 4.2 mmol/L (ref 3.5–5.2)
Sodium: 144 mmol/L (ref 134–144)

## 2013-04-15 LAB — TESTOSTERONE,FREE AND TOTAL
Testosterone, Free: 7.8 pg/mL (ref 7.2–24.0)
Testosterone: 418 ng/dL (ref 348–1197)

## 2013-04-15 LAB — PSA, TOTAL AND FREE
PSA, Free Pct: 63.3 %
PSA, Free: 0.19 ng/mL
PSA: 0.3 ng/mL (ref 0.0–4.0)

## 2013-04-15 LAB — HEPATIC FUNCTION PANEL
AST: 24 IU/L (ref 0–40)
Alkaline Phosphatase: 74 IU/L (ref 39–117)
Total Bilirubin: 0.6 mg/dL (ref 0.0–1.2)
Total Protein: 7 g/dL (ref 6.0–8.5)

## 2013-04-15 LAB — NMR, LIPOPROFILE
Cholesterol: 69 mg/dL (ref <200)
HDL Cholesterol by NMR: 33 mg/dL — ABNORMAL LOW (ref >=40)
HDL Particle Number: 27.9 umol/L — ABNORMAL LOW (ref >=30.5)
LDL Size: 20.9 nm (ref >20.5)
LP-IR Score: 40 (ref <=45)
Small LDL Particle Number: 409 nmol/L (ref <=527)
Triglycerides by NMR: 42 mg/dL (ref <150)

## 2013-05-01 ENCOUNTER — Ambulatory Visit (INDEPENDENT_AMBULATORY_CARE_PROVIDER_SITE_OTHER): Payer: BC Managed Care – PPO | Admitting: Family Medicine

## 2013-05-01 ENCOUNTER — Encounter: Payer: Self-pay | Admitting: Family Medicine

## 2013-05-01 VITALS — BP 155/86 | HR 87 | Temp 98.7°F | Ht 69.5 in | Wt 216.7 lb

## 2013-05-01 DIAGNOSIS — L989 Disorder of the skin and subcutaneous tissue, unspecified: Secondary | ICD-10-CM

## 2013-05-01 DIAGNOSIS — J069 Acute upper respiratory infection, unspecified: Secondary | ICD-10-CM

## 2013-05-01 MED ORDER — AZITHROMYCIN 250 MG PO TABS: ORAL_TABLET | ORAL | Status: DC

## 2013-05-01 NOTE — Progress Notes (Signed)
  Subjective:    Patient ID: [name] [name], male    DOB: 27-Nov-1955, 57 y.o.   MRN: [id number]  HPI This 57 y.o. male presents for evaluation of skin lesion on his right forehead.  He has been Getting skin lesions froze of his head in the past.  He states he had a horn like skin lesion on His right forehead and it fell off in his sleep.  He is having URI sx's for over a week now.  He was Wanting to get flu shot.   Review of Systems    No chest pain, SOB, HA, dizziness, vision change, N/V, diarrhea, constipation, dysuria, urinary urgency or frequency, myalgias, arthralgias or rash.  Objective:   Physical Exam  Vital signs noted  Well developed well nourished male.  HEENT - Head atraumatic Normocephalic                Eyes - PERRLA, Conjuctiva - clear Sclera- Clear EOMI                Ears - EAC's Wnl TM's Wnl Gross Hearing WNL                Nose - Nares patent                 Throat - oropharanx wnl Respiratory - Lungs CTA bilateral Cardiac - RRR S1 and S2 without murmur GI - Abdomen soft Nontender and bowel sounds active x 4 Extremities - No edema. Neuro - Grossly intact. Skin - Right scalp with erythematous macule about 3mm in diameter.  Liquid nitrogen applied.     Assessment & Plan:  URI (upper respiratory infection) Zpak as directed, push po fluids, rest, and follow up prn.  Skin Lesion - Liquid Nitrogen applied x 3  Follow up prn.  Discussed wait until feeling better and can get flu shot.  Deatra Canter FNP

## 2013-05-01 NOTE — Patient Instructions (Signed)
Actinic Keratosis  Actinic keratosis is a precancerous growth on the skin. This means it could develop into skin cancer if it is not treated. About 1% of actinic keratoses turn into skin cancer within a year. It is important to have all such growths removed to prevent them from developing into skin cancer.  CAUSES   Actinic keratosis is caused by getting too much ultraviolet (UV) radiation from the sun or other UV light sources.  RISK FACTORS  Factors that increase your chances of getting actinic keratosis include:   Having light-colored skin and blue eyes.   Having blonde or red hair.   Spending a lot of time in the sun.   Age. The risk of actinic keratosis increases with age.  SYMPTOMS   Actinic keratosis growths look like scaly, rough spots of skin. They can be as small as a pinhead or as big as a quarter. They may itch, hurt, or feel sensitive. Sometimes there is a little tag of pink or gray skin growing off them. In some cases, actinic keratoses are easier felt than seen. They do not go away with the use of moisturizing lotions or creams. Actinic keratoses appear most often on areas of skin that get a lot of sun exposure. These areas include the:   Scalp.   Face.   Ears.   Lips.   Upper back.   Backs of the hands.   Forearms.  DIAGNOSIS   Your caregiver can usually tell what is wrong by performing a physical exam. A tissue sample (biopsy) may also be taken and examined under a microscope.  TREATMENT   Actinic keratosis can be treated several ways. Most treatments can be done in your caregiver's office. Treatment options may include:   Curettage. A tool is used to gently scrape off the growth.   Cryosurgery. Liquid nitrogen is applied to the growth to freeze it. The growth eventually falls off the skin.   Medicated creams, such as 5-fluorouracil or imiquimod. The medicine destroys the cells in the growth.    Chemical peels. Chemicals are applied to the growth and the outer layers of skin are peeled off.   Photodynamic therapy. A drug that makes your skin more sensitive to light is applied to the skin. A strong, blue light is aimed at the skin and destroys the growth.  PREVENTION   To prevent future sun damage:   Try to avoid the sun between 10:00 a.m. and 4:00 p.m. when it is the strongest.   Use a sunscreen or sunblock with SPF 30 or greater.   Apply sunscreen at least 30 minutes before exposure to the sun.   Always wear protective hats, clothing, and sunglasses with UV protection.   Avoid medicines, herbs, and foods that increase your sensitivity to sunlight.   Avoid tanning beds.  HOME CARE INSTRUCTIONS    If your skin was covered with a bandage, change and remove the bandage as directed by your caregiver.   Keep the treated area dry as directed by your caregiver.   Apply any creams as prescribed by your caregiver. Follow the directions carefully.   Check your skin regularly for any changes.   Visit a skin doctor (dermatologist) every year for a skin exam.  SEEK MEDICAL CARE IF:    Your skin does not heal and becomes irritated, red, or bleeds.   You notice any changes or new growths on your skin.  Document Released: 09/18/2008 Document Revised: 09/14/2011 Document Reviewed: 08/03/2011  [employer] Patient Information   2014 [employer]

## 2013-05-11 ENCOUNTER — Other Ambulatory Visit: Payer: Self-pay

## 2013-05-17 ENCOUNTER — Ambulatory Visit (INDEPENDENT_AMBULATORY_CARE_PROVIDER_SITE_OTHER): Payer: BC Managed Care – PPO | Admitting: *Deleted

## 2013-05-17 DIAGNOSIS — Z23 Encounter for immunization: Secondary | ICD-10-CM

## 2013-07-24 ENCOUNTER — Ambulatory Visit (INDEPENDENT_AMBULATORY_CARE_PROVIDER_SITE_OTHER): Payer: BC Managed Care – PPO | Admitting: Family Medicine

## 2013-07-24 ENCOUNTER — Encounter: Payer: Self-pay | Admitting: Family Medicine

## 2013-07-24 VITALS — BP 155/82 | HR 74 | Temp 98.8°F | Ht 69.5 in | Wt 211.0 lb

## 2013-07-24 DIAGNOSIS — E119 Type 2 diabetes mellitus without complications: Secondary | ICD-10-CM

## 2013-07-24 DIAGNOSIS — E559 Vitamin D deficiency, unspecified: Secondary | ICD-10-CM

## 2013-07-24 DIAGNOSIS — E785 Hyperlipidemia, unspecified: Secondary | ICD-10-CM

## 2013-07-24 DIAGNOSIS — L0211 Cutaneous abscess of neck: Secondary | ICD-10-CM

## 2013-07-24 DIAGNOSIS — L03221 Cellulitis of neck: Secondary | ICD-10-CM

## 2013-07-24 DIAGNOSIS — I10 Essential (primary) hypertension: Secondary | ICD-10-CM

## 2013-07-24 DIAGNOSIS — Z8679 Personal history of other diseases of the circulatory system: Secondary | ICD-10-CM

## 2013-07-24 LAB — POCT GLYCOSYLATED HEMOGLOBIN (HGB A1C): HEMOGLOBIN A1C: 7.5

## 2013-07-24 MED ORDER — DOXYCYCLINE HYCLATE 100 MG PO TABS: 100.0000 mg | ORAL_TABLET | Freq: Two times a day (BID) | ORAL | Status: DC

## 2013-07-24 MED ORDER — GLIPIZIDE 5 MG PO TABS: 5.0000 mg | ORAL_TABLET | Freq: Every day | ORAL | Status: DC

## 2013-07-24 MED ORDER — NIACIN ER (ANTIHYPERLIPIDEMIC) 1000 MG PO TBCR: 1000.0000 mg | EXTENDED_RELEASE_TABLET | Freq: Every day | ORAL | Status: DC

## 2013-07-24 NOTE — Patient Instructions (Addendum)
Continue current medications. Continue good therapeutic lifestyle changes which include good diet and exercise. Fall precautions discussed with patient. Schedule your flu vaccine if you haven't had it yet If you are over 59 years old - you may need Prevnar 13 or the adult Pneumonia vaccine. Take antibiotic as directed Use warm wet compresses to the left neck as frequently as possible Continue to monitor blood sugars and blood pressures at home Watch sodium intake Use scent-free fabric softener and detergent

## 2013-07-24 NOTE — Progress Notes (Signed)
Subjective:    Patient ID: [name] [name], male    DOB: 20-Aug-1955, 58 y.o.   MRN: [id number]  HPI Pt here for follow up and management of chronic medical problems. Patient is due to get lab work today. He'll get a traditional lipid panel. He is also due to get a Prevnar vaccine and he will check with his insurance regarding coverage for this vaccine. His eye exam is due this month. He will return to FOBT given to him. Patient also has an abscess or sore on his left neck. This is one of several that he has had. He checks his blood pressures at home and they usually run in the 140s over the 80s. His blood sugar is usually in the 140s fasting and 2 hours after eating maybe 170.        Patient Active Problem List   Diagnosis Date Noted  . Ascending aortic aneurysm 12/12/2012  . INSOMNIA 09/15/2010  . ATRIAL FIBRILLATION, PAROXYSMAL, HX OF 09/15/2010  . CORONARY ARTERY DISEASE, S/P PTCA 07/09/2009  . AORTIC VALVE DISORDERS 05/27/2009  . OSTEOARTHRITIS, LOCALIZED 05/27/2009  . DM 05/24/2009  . HYPERLIPIDEMIA 05/24/2009  . HYPOPOTASSEMIA 05/24/2009  . HYPERTENSION 05/24/2009  . UNSPECIFIED SLEEP APNEA 05/24/2009   Outpatient Encounter Prescriptions as of 07/24/2013  Medication Sig  . aspirin (SB LOW DOSE ASA EC) 81 MG EC tablet Take 81 mg by mouth daily.    [name] atorvastatin (LIPITOR) 40 MG tablet Take 1 tablet (40 mg total) by mouth daily.  . cholecalciferol (VITAMIN D) 1000 UNITS tablet Take 2,000 Units by mouth daily.   . fluticasone (FLONASE) 50 MCG/ACT nasal spray Place 2 sprays into the nose daily.  [name] glipiZIDE (GLUCOTROL) 5 MG tablet Take 5 mg by mouth daily.    [name] lisinopril (PRINIVIL,ZESTRIL) 20 MG tablet Take 1 tablet (20 mg total) by mouth daily.  . metFORMIN (GLUCOPHAGE) 500 MG tablet Take 1 tablet (500 mg total) by mouth 2 (two) times daily with a meal.  . niacin (NIASPAN) 1000 MG CR tablet Take 1 tablet (1,000 mg total) by mouth at bedtime.  . temazepam (RESTORIL) 15 MG  capsule Take 15 mg by mouth at bedtime as needed.    . Testosterone (ANDROGEL PUMP) 20.25 MG/ACT (1.62%) GEL Apply 3 each topically daily. pumps  . [DISCONTINUED] azithromycin (ZITHROMAX) 250 MG tablet Take 2 po first day and then one po qd x 4 days    Review of Systems  Constitutional: Negative.   HENT: Negative.   Eyes: Negative.   Respiratory: Negative.   Cardiovascular: Negative.   Gastrointestinal: Negative.   Endocrine: Negative.   Genitourinary: Negative.   Musculoskeletal: Negative.   Skin: Negative.        Check places around neck that have recently started  Allergic/Immunologic: Negative.   Neurological: Negative.   Hematological: Negative.   Psychiatric/Behavioral: Negative.        Objective:   Physical Exam  Nursing note and vitals reviewed. Constitutional: He is oriented to person, place, and time. He appears well-developed and well-nourished. No distress.  HENT:  Head: Normocephalic and atraumatic.  Right Ear: External ear normal.  Left Ear: External ear normal.  Nose: Nose normal.  Mouth/Throat: Oropharynx is clear and moist. No oropharyngeal exudate.  Eyes: Conjunctivae and EOM are normal. Pupils are equal, round, and reactive to light. Right eye exhibits no discharge. Left eye exhibits no discharge. No scleral icterus.  Neck: Normal range of motion. Neck supple. No thyromegaly present.   No  axillary adenopathy  Cardiovascular: Normal rate, regular rhythm, normal heart sounds and intact distal pulses.  Exam reveals no gallop and no friction rub.   No murmur heard. At 72 per minute, rare PVC  Pulmonary/Chest: Effort normal and breath sounds normal. No respiratory distress. He has no wheezes. He has no rales. He exhibits no tenderness.  Abdominal: Soft. Bowel sounds are normal. He exhibits no mass. There is no tenderness. There is no rebound and no guarding.  No inguinal adenopathy  Musculoskeletal: Normal range of motion. He exhibits no edema and no  tenderness.  Lymphadenopathy:    He has no cervical adenopathy.  Neurological: He is alert and oriented to person, place, and time. He has normal reflexes. No cranial nerve deficit.  Skin: Skin is warm and dry. No rash noted. There is erythema. No pallor.  Erythematous papules left lateral neck and one abscess that looks like it's ready to drain  Psychiatric: He has a normal mood and affect. His behavior is normal. Judgment and thought content normal.   BP 155/82  Pulse 74  Temp(Src) 98.8 F (37.1 C) (Oral)  Ht 5' 9.5" (1.765 m)  Wt 211 lb (95.709 kg)  BMI 30.72 kg/m2        Assessment & Plan:  1. HYPERTENSION - BMP8+EGFR - Hepatic function panel - CBC With differential/Platelet  2. HYPERLIPIDEMIA - Lipid panel - CBC With differential/Platelet  3. DM - POCT glycosylated hemoglobin (Hb A1C) - CBC With differential/Platelet  4. ATRIAL FIBRILLATION, PAROXYSMAL, HX OF - CBC With differential/Platelet  5. Vitamin D deficiency - Vit D  25 hydroxy (rtn osteoporosis monitoring) - CBC With differential/Platelet  6. Diabetes - POCT glycosylated hemoglobin (Hb A1C) - CBC With differential/Platelet  7. Abscess of neck - doxycycline (VIBRA-TABS) 100 MG tablet; Take 1 tablet (100 mg total) by mouth 2 (two) times daily.  Dispense: 60 tablet; Refill: 0  Patient Instructions  Continue current medications. Continue good therapeutic lifestyle changes which include good diet and exercise. Fall precautions discussed with patient. Schedule your flu vaccine if you haven't had it yet If you are over 26 years old - you may need Prevnar 45 or the adult Pneumonia vaccine. Take antibiotic as directed Use warm wet compresses to the left neck as frequently as possible Continue to monitor blood sugars and blood pressures at home Watch sodium intake Use scent-free fabric softener and detergent   Arrie Senate MD

## 2013-07-25 LAB — CBC WITH DIFFERENTIAL
BASOS ABS: 0 10*3/uL (ref 0.0–0.2)
Basos: 0 %
Eos: 2 %
Eosinophils Absolute: 0.2 10*3/uL (ref 0.0–0.4)
HCT: 40.8 % (ref 37.5–51.0)
Hemoglobin: 14.3 g/dL (ref 12.6–17.7)
IMMATURE GRANS (ABS): 0 10*3/uL (ref 0.0–0.1)
Immature Granulocytes: 0 %
Lymphocytes Absolute: 2.4 10*3/uL (ref 0.7–3.1)
Lymphs: 31 %
MCH: 32.6 pg (ref 26.6–33.0)
MCHC: 35 g/dL (ref 31.5–35.7)
MCV: 93 fL (ref 79–97)
MONOCYTES: 8 %
Monocytes Absolute: 0.6 10*3/uL (ref 0.1–0.9)
NEUTROS PCT: 59 %
Neutrophils Absolute: 4.5 10*3/uL (ref 1.4–7.0)
PLATELETS: 219 10*3/uL (ref 150–379)
RBC: 4.38 x10E6/uL (ref 4.14–5.80)
RDW: 13.1 % (ref 12.3–15.4)
WBC: 7.7 10*3/uL (ref 3.4–10.8)

## 2013-07-25 LAB — VITAMIN D 25 HYDROXY (VIT D DEFICIENCY, FRACTURES): VIT D 25 HYDROXY: 38.9 ng/mL (ref 30.0–100.0)

## 2013-07-25 LAB — HEPATIC FUNCTION PANEL
ALBUMIN: 4.8 g/dL (ref 3.5–5.5)
ALT: 42 IU/L (ref 0–44)
AST: 25 IU/L (ref 0–40)
Alkaline Phosphatase: 73 IU/L (ref 39–117)
BILIRUBIN DIRECT: 0.17 mg/dL (ref 0.00–0.40)
Total Bilirubin: 0.4 mg/dL (ref 0.0–1.2)
Total Protein: 7.2 g/dL (ref 6.0–8.5)

## 2013-07-25 LAB — BMP8+EGFR
BUN/Creatinine Ratio: 26 — ABNORMAL HIGH (ref 9–20)
BUN: 20 mg/dL (ref 6–24)
CALCIUM: 9.5 mg/dL (ref 8.7–10.2)
CO2: 21 mmol/L (ref 18–29)
CREATININE: 0.78 mg/dL (ref 0.76–1.27)
Chloride: 101 mmol/L (ref 97–108)
GFR calc Af Amer: 115 mL/min/{1.73_m2} (ref >59)
GFR, EST NON AFRICAN AMERICAN: 99 mL/min/{1.73_m2} (ref >59)
Glucose: 123 mg/dL — ABNORMAL HIGH (ref 65–99)
Potassium: 4.3 mmol/L (ref 3.5–5.2)
Sodium: 142 mmol/L (ref 134–144)

## 2013-07-25 LAB — LIPID PANEL
CHOL/HDL RATIO: 2.4 ratio (ref 0.0–5.0)
Cholesterol, Total: 86 mg/dL — ABNORMAL LOW (ref 100–199)
HDL: 36 mg/dL — AB (ref >39)
LDL CALC: 41 mg/dL (ref 0–99)
Triglycerides: 46 mg/dL (ref 0–149)
VLDL CHOLESTEROL CAL: 9 mg/dL (ref 5–40)

## 2013-07-28 ENCOUNTER — Other Ambulatory Visit: Payer: Self-pay

## 2013-08-07 ENCOUNTER — Ambulatory Visit (INDEPENDENT_AMBULATORY_CARE_PROVIDER_SITE_OTHER): Payer: BC Managed Care – PPO | Admitting: Family Medicine

## 2013-08-07 ENCOUNTER — Encounter: Payer: Self-pay | Admitting: Family Medicine

## 2013-08-07 VITALS — BP 133/73 | HR 79 | Temp 99.6°F | Ht 69.5 in | Wt 213.0 lb

## 2013-08-07 DIAGNOSIS — L0211 Cutaneous abscess of neck: Secondary | ICD-10-CM

## 2013-08-07 DIAGNOSIS — L03221 Cellulitis of neck: Secondary | ICD-10-CM

## 2013-08-07 NOTE — Patient Instructions (Signed)
Continue and finish the current antibiotic for 2 more weeks You can purchase align over-the-counter-this will help to repopulate your gut with normal bacterial flora If the abscesses do not continue to heal please return to clinic sooner otherwise keep your regular appointment in May

## 2013-08-07 NOTE — Progress Notes (Signed)
Subjective:    Patient ID: [name] [name], male    DOB: January 09, 1956, 58 y.o.   MRN: [id number]  HPI Patient here today for 2 week follow up on recent abscess on the neck. The neck abscess is much improved as well as some other small abscesses around his neck and on the right index finger. He still has weeks worth of antibiotic therapy to take. .    Patient Active Problem List   Diagnosis Date Noted  . Ascending aortic aneurysm 12/12/2012  . INSOMNIA 09/15/2010  . ATRIAL FIBRILLATION, PAROXYSMAL, HX OF 09/15/2010  . CORONARY ARTERY DISEASE, S/P PTCA 07/09/2009  . AORTIC VALVE DISORDERS 05/27/2009  . OSTEOARTHRITIS, LOCALIZED 05/27/2009  . DM 05/24/2009  . HYPERLIPIDEMIA 05/24/2009  . Hypokalemia 05/24/2009  . HYPERTENSION 05/24/2009  . UNSPECIFIED SLEEP APNEA 05/24/2009   Outpatient Encounter Prescriptions as of 08/07/2013  Medication Sig  . aspirin (SB LOW DOSE ASA EC) 81 MG EC tablet Take 81 mg by mouth daily.    [name] atorvastatin (LIPITOR) 40 MG tablet Take 1 tablet (40 mg total) by mouth daily.  . cholecalciferol (VITAMIN D) 1000 UNITS tablet Take 2,000 Units by mouth daily.   [name] doxycycline (VIBRA-TABS) 100 MG tablet Take 1 tablet (100 mg total) by mouth 2 (two) times daily.  . fluticasone (FLONASE) 50 MCG/ACT nasal spray Place 2 sprays into the nose daily.  [name] glipiZIDE (GLUCOTROL) 5 MG tablet Take 1 tablet (5 mg total) by mouth daily.  [name] lisinopril (PRINIVIL,ZESTRIL) 20 MG tablet Take 1 tablet (20 mg total) by mouth daily.  . metFORMIN (GLUCOPHAGE) 500 MG tablet Take 1 tablet (500 mg total) by mouth 2 (two) times daily with a meal.  . niacin (NIASPAN) 1000 MG CR tablet Take 1 tablet (1,000 mg total) by mouth at bedtime.  . temazepam (RESTORIL) 15 MG capsule Take 15 mg by mouth at bedtime as needed.    . Testosterone (ANDROGEL PUMP) 20.25 MG/ACT (1.62%) GEL Apply 3 each topically daily. pumps    Review of Systems  Constitutional: Negative.   HENT: Negative.   Eyes: Negative.     Respiratory: Negative.   Cardiovascular: Negative.   Gastrointestinal: Negative.   Endocrine: Negative.   Genitourinary: Negative.   Musculoskeletal: Negative.   Skin: Negative.        abscess on neck - better  Allergic/Immunologic: Negative.   Neurological: Negative.   Hematological: Negative.   Psychiatric/Behavioral: Negative.        Objective:   Physical Exam  Nursing note and vitals reviewed. Constitutional: He is oriented to person, place, and time. He appears well-developed and well-nourished.  HENT:  Head: Normocephalic and atraumatic.  Neck: Normal range of motion. Neck supple.  Musculoskeletal: Normal range of motion.  Neurological: He is alert and oriented to person, place, and time.  Skin: Skin is warm and dry. No rash noted. No pallor.  The large abscess on the left neck is much much smaller. There is no drainage. The other small skin lesions have healed. Also one on the right index finger has healed  Psychiatric: He has a normal mood and affect. His behavior is normal. Judgment and thought content normal.   BP 133/73  Pulse 79  Temp(Src) 99.6 F (37.6 C) (Oral)  Ht 5' 9.5" (1.765 m)  Wt 213 lb (96.616 kg)  BMI 31.01 kg/m2        Assessment & Plan:  Abscess of neck, improved  Patient Instructions  Continue and finish the current antibiotic for  2 more weeks You can purchase align over-the-counter-this will help to repopulate your gut with normal bacterial flora If the abscesses do not continue to heal please return to clinic sooner otherwise keep your regular appointment in May   Nyra Capeson W. Salo MD

## 2013-08-13 ENCOUNTER — Other Ambulatory Visit: Payer: Self-pay | Admitting: Family Medicine

## 2013-08-15 NOTE — Telephone Encounter (Signed)
Last seen 08/07/13 DWM  If approved route to nurse to [facility] in [facility]  [phone]

## 2013-08-31 LAB — FECAL OCCULT BLOOD, IMMUNOCHEMICAL: Fecal Occult Bld: NEGATIVE

## 2013-08-31 LAB — SPECIMEN STATUS REPORT

## 2013-10-10 ENCOUNTER — Encounter: Payer: Self-pay | Admitting: Cardiology

## 2013-10-10 ENCOUNTER — Telehealth: Payer: Self-pay | Admitting: Cardiology

## 2013-10-10 ENCOUNTER — Ambulatory Visit (INDEPENDENT_AMBULATORY_CARE_PROVIDER_SITE_OTHER): Payer: BC Managed Care – PPO | Admitting: Cardiology

## 2013-10-10 VITALS — BP 150/74 | HR 85 | Ht 70.0 in | Wt 211.0 lb

## 2013-10-10 DIAGNOSIS — I712 Thoracic aortic aneurysm, without rupture, unspecified: Secondary | ICD-10-CM

## 2013-10-10 DIAGNOSIS — I7121 Aneurysm of the ascending aorta, without rupture: Secondary | ICD-10-CM

## 2013-10-10 DIAGNOSIS — I10 Essential (primary) hypertension: Secondary | ICD-10-CM

## 2013-10-10 DIAGNOSIS — I359 Nonrheumatic aortic valve disorder, unspecified: Secondary | ICD-10-CM

## 2013-10-10 MED ORDER — LISINOPRIL 40 MG PO TABS: 40.0000 mg | ORAL_TABLET | Freq: Every day | ORAL | Status: DC

## 2013-10-10 NOTE — Telephone Encounter (Signed)
Auth # [id number] exp 11/08/13

## 2013-10-10 NOTE — Patient Instructions (Signed)
Your physician recommends that you schedule a follow-up appointment in: 1 year with Dr. Wyline MoodBranch. You should receive a letter in the mail in 10 months. If you do not receive this letter by February 2016 call our office to schedule this appointment.   Your physician has recommended you make the following change in your medication:  Increase: Lisinopril 40 MG take 1 tablet by mouth once daily  Continue all other medications the same.   Your physician recommends that you return for lab work in: 3 weeks (around 10-31-2013) for BMET.  You may go to one of the following locations to have lab work completed: [facility] [address] Dr. Lysbeth [place] [facility].   Your physician has requested you monitor and record your blood pressure and take recordings with you to your next visit with your primary care doctor.   Your physician has requested you have an MR MRI of your aortic aneurysm.

## 2013-10-10 NOTE — Telephone Encounter (Signed)
MRI/MRA CHEST with and without contrast scheduled for 10/12/13 at [facility]

## 2013-10-10 NOTE — Progress Notes (Signed)
Clinical Summary Mr. Christell ConstantMoore is a 58 y.o.male last seen by Dr Antoine PocheHochrein, this is our first visit together. He is seen for the following medical problems.   1. AVR - history of bicuspid AV, s/p bioprosthetic AVR in 2012 - denies any chest pain, SOB, DOE, presyncope, or syncope   2. Ascending aortic aneurysm - mild aortic root dilatation at 4cm by last MRI, likely related to hx of bicuspid AV and potential coexisting aortopathy. - denies any chest pain  3. HTN - checks bp occasionally, typically around 130s/70s - compliant with meds  4. OSA - mixed compliance with CPAP  5. Hyperlipidemia - compliant with lipitor Last panel 07/2013 TC 86 TG 46 HDL 36 LDL 41   Past Medical History  Diagnosis Date  . OSA on CPAP 2006  . NIDDM (non-insulin dependent diabetes mellitus)   . Diastolic dysfunction 8/09  . Aortic stenosis 8/09    Bicuspid aortic valve  . CTS (carpal tunnel syndrome)     bilateral   . Hypertension   . Sinusitis   . Nasal congestion   . Hyperlipidemia   . Vitamin D deficiency 06/13/09  . Arthritis 6/10    hand   . Kidney stones   . Paresthesias in left hand 02/19/2004  . Paresthesias in right hand 02/19/2004  . CAD (coronary artery disease) 8/09  . Atrial fibrillation     post op only     Allergies  Allergen Reactions  . Hctz [Hydrochlorothiazide]     Nausea and headache   . Sulfa Antibiotics   . Sulfonamide Derivatives      Current Outpatient Prescriptions  Medication Sig Dispense Refill  . ANDROGEL PUMP 20.25 MG/ACT (1.62%) GEL APPLY 3 PUMPS TOPICALLY EACH DAY. HAS DISCOUNT CARD PDM  75 g  3  . aspirin (SB LOW DOSE ASA EC) 81 MG EC tablet Take 81 mg by mouth daily.        [name]. atorvastatin (LIPITOR) 40 MG tablet Take 1 tablet (40 mg total) by mouth daily.  90 tablet  3  . cholecalciferol (VITAMIN D) 1000 UNITS tablet Take 2,000 Units by mouth daily.       [name]. doxycycline (VIBRA-TABS) 100 MG tablet Take 1 tablet (100 mg total) by mouth 2 (two) times  daily.  60 tablet  0  . fluticasone (FLONASE) 50 MCG/ACT nasal spray Place 2 sprays into the nose daily.  16 g  6  . glipiZIDE (GLUCOTROL) 5 MG tablet Take 1 tablet (5 mg total) by mouth daily.  90 tablet  3  . lisinopril (PRINIVIL,ZESTRIL) 20 MG tablet Take 1 tablet (20 mg total) by mouth daily.  90 tablet  3  . metFORMIN (GLUCOPHAGE) 500 MG tablet Take 1 tablet (500 mg total) by mouth 2 (two) times daily with a meal.  180 tablet  3  . niacin (NIASPAN) 1000 MG CR tablet Take 1 tablet (1,000 mg total) by mouth at bedtime.  90 tablet  3  . temazepam (RESTORIL) 15 MG capsule Take 15 mg by mouth at bedtime as needed.         No current facility-administered medications for this visit.     Past Surgical History  Procedure Laterality Date  . Coronary angioplasty with stent placement      to rt coronary atery (Dr. Degert-cardiologist)  . Tissue aortic valve replacement    . Carpal tunnel release      bilateral      Allergies  Allergen Reactions  .  Hctz [Hydrochlorothiazide]     Nausea and headache   . Sulfa Antibiotics   . Sulfonamide Derivatives       Family History  Problem Relation Age of Onset  . Diabetes      Family History   . Hearing loss Mother   . Alzheimer's disease Mother   . Hearing loss Father   . Diabetes Father      Social History Mr. Graca reports that he quit smoking about 11 years ago. His smoking use included Cigarettes. He has a 37.5 pack-year smoking history. He has never used smokeless tobacco. Mr. Aikey reports that he does not drink alcohol.   Review of Systems CONSTITUTIONAL: No weight loss, fever, chills, weakness or fatigue.  HEENT: Eyes: No visual loss, blurred vision, double vision or yellow sclerae.No hearing loss, sneezing, congestion, runny nose or sore throat.  SKIN: No rash or itching.  CARDIOVASCULAR: per HPI RESPIRATORY: No shortness of breath, cough or sputum.  GASTROINTESTINAL: No anorexia, nausea, vomiting or diarrhea. No  abdominal pain or blood.  GENITOURINARY: No burning on urination, no polyuria NEUROLOGICAL: No headache, dizziness, syncope, paralysis, ataxia, numbness or tingling in the extremities. No change in bowel or bladder control.  MUSCULOSKELETAL: No muscle, back pain, joint pain or stiffness.  LYMPHATICS: No enlarged nodes. No history of splenectomy.  PSYCHIATRIC: No history of depression or anxiety.  ENDOCRINOLOGIC: No reports of sweating, cold or heat intolerance. No polyuria or polydipsia.  [name]   Physical Examination p 85 bp 142/80 Wt 211 lbs BMI 30 Gen: resting comfortably, no acute distress HEENT: no scleral icterus, pupils equal round and reactive, no palptable cervical adenopathy,  CV: RRR, 2/6 systolic murmur RUSB, no JVD Resp: Clear to auscultation bilaterally GI: abdomen is soft, non-tender, non-distended, normal bowel sounds, no hepatosplenomegaly MSK: extremities are warm, no edema.  Skin: warm, no rash Neuro:  no focal deficits Psych: appropriate affect   Diagnostic Studies 09/2012 MRI/MRA Chest Stable 4 cm ascending thoracic aneurysm  08/2010 Cath HEMODYNAMIC DATA: Right atrial pressures 7/5 with a mean of 4 mmHg,  right ventricular pressure is 24 with an EDP of 7 mmHg. Pulmonary  artery pressure is 20/7 with a mean of 13 mmHg. Pulmonary capillary  wedge pressure is 10/7 with a mean of 6 mmHg. Aortic pressure is 118/67  with a mean of 88 mmHg. Left ventricular pressure is 161 with an EDP of  12 mmHg. There is no significant mitral valve gradient. The aortic  valve mean gradient is 31 mmHg with calculated valve area of 0.8  centimeter squared. Thermodilution cardiac output is 4.4 L per minute  with an index of 2, Fick cardiac outputs 4.7 L per minute with an index  of 2.2.  ANGIOGRAPHIC DATA: The left ventricular angiography was performed in a  RAO view. This demonstrates normal left ventricular size and  contractility with normal systolic function. Ejection fraction is    estimated at 65%. The aortic valve is calcified with eccentric opening  and restrictive mobility.  The left coronary artery arises and distributes normally. The left main  coronary has 20% narrowing in the ostium and in the distal left main.  The left anterior descending artery is a tortuous vessel which has less  than 10% irregularities.  Left circumflex coronary artery is rise to first obtuse marginal vessel  which has a 40-50% stenosis proximally.  The right coronary artery is a dominant vessel, it has 20% disease in  the distal vessel. The stent in the midvessel was  widely patent.  FINAL INTERPRETATION:  1. Nonobstructive atherosclerotic coronary artery disease.  2. Normal left ventricular function.  3. Severe aortic stenosis.  4. Normal right heart pressures.  PLAN: Proceed with aortic valve replacement.   Assessment and Plan  1. Aortic valve replacement - hx of bicuspid AV, s/p bioprosthetic AVR in 2012 - denies any current symptoms, continue to follow clinically  2. Ascending aortic root aneurysm - hx of bicuspid AV, risk of aortopathy - mild dilatation by MRI 1 year ago, repeat study.  3. HTN - at goal, contineu current meds  4. OSA - encouraged increased compliance with CPAP  5. Hyperlipidemia - at goal, continue current statin    Antoine Poche, M.D., F.A.C.C.

## 2013-10-12 ENCOUNTER — Ambulatory Visit (HOSPITAL_COMMUNITY)
Admission: RE | Admit: 2013-10-12 | Discharge: 2013-10-12 | Disposition: A | Discharge status: Home / Self Care | Payer: BC Managed Care – PPO | Source: Ambulatory Visit | Attending: Cardiology | Admitting: Cardiology

## 2013-10-12 DIAGNOSIS — I7121 Aneurysm of the ascending aorta, without rupture: Secondary | ICD-10-CM

## 2013-10-12 DIAGNOSIS — I712 Thoracic aortic aneurysm, without rupture, unspecified: Secondary | ICD-10-CM | POA: Insufficient documentation

## 2013-10-12 LAB — POCT I-STAT CREATININE: CREATININE: 1.1 mg/dL (ref 0.50–1.35)

## 2013-10-12 MED ORDER — GADOBENATE DIMEGLUMINE 529 MG/ML IV SOLN
20.0000 mL | Freq: Once | INTRAVENOUS | Status: AC | PRN
  Administered 2013-10-12: 20 mL via INTRAVENOUS

## 2013-10-12 MED ORDER — SODIUM CHLORIDE 0.9 % IV SOLN
INTRAVENOUS | Status: AC
  Filled 2013-10-12: qty 150

## 2013-10-17 ENCOUNTER — Telehealth: Payer: Self-pay | Admitting: Cardiology

## 2013-10-17 NOTE — Telephone Encounter (Signed)
Message copied by Burnice LoganATES, Chetara Kropp M on Tue Oct 17, 2013  1:59 PM ------      Message from: GreensburgBRANCH, JONATHAN F      Created: Mon Oct 16, 2013 10:51 AM       Please let patient know that aorta is stable in size.                   Dina RichJonathan Branch MD ------

## 2013-10-17 NOTE — Telephone Encounter (Signed)
Pt informed of results and verbalized understanding.  

## 2013-10-25 ENCOUNTER — Ambulatory Visit (INDEPENDENT_AMBULATORY_CARE_PROVIDER_SITE_OTHER): Payer: BC Managed Care – PPO | Admitting: General Practice

## 2013-10-25 ENCOUNTER — Encounter: Payer: Self-pay | Admitting: *Deleted

## 2013-10-25 ENCOUNTER — Encounter: Payer: Self-pay | Admitting: General Practice

## 2013-10-25 VITALS — BP 139/76 | HR 82 | Temp 98.4°F | Ht 70.0 in | Wt 214.4 lb

## 2013-10-25 DIAGNOSIS — L259 Unspecified contact dermatitis, unspecified cause: Secondary | ICD-10-CM

## 2013-10-25 DIAGNOSIS — I10 Essential (primary) hypertension: Secondary | ICD-10-CM

## 2013-10-25 MED ORDER — TRIAMCINOLONE ACETONIDE 0.025 % EX CREA: 1.0000 "application " | TOPICAL_CREAM | Freq: Two times a day (BID) | CUTANEOUS | Status: DC

## 2013-10-25 MED ORDER — METHYLPREDNISOLONE ACETATE 80 MG/ML IJ SUSP
80.0000 mg | Freq: Once | INTRAMUSCULAR | Status: AC
  Administered 2013-10-25: 80 mg via INTRAMUSCULAR

## 2013-10-25 NOTE — Progress Notes (Signed)
   Subjective:    Patient ID: [name] [name], male    DOB: [birth year], 58 y.o.   MRN: [id number]  Rash This is a new problem. The current episode started in the past 7 days. The problem is unchanged. The affected locations include the right lower leg. The rash is characterized by redness and itchiness. He was exposed to nothing. Pertinent negatives include no congestion, cough, fever or shortness of breath. Past treatments include anti-itch cream.      Review of Systems  Constitutional: Negative for fever and chills.  HENT: Negative for congestion.   Respiratory: Negative for cough, chest tightness and shortness of breath.   Cardiovascular: Negative for chest pain and palpitations.  Skin: Positive for rash.       Red rash to right lower leg       Objective:   Physical Exam  Constitutional: He is oriented to person, place, and time. He appears well-developed and well-nourished.  Cardiovascular: Normal rate, regular rhythm and normal heart sounds.   Pulmonary/Chest: Effort normal and breath sounds normal. No respiratory distress. He exhibits no tenderness.  Neurological: He is alert and oriented to person, place, and time.  Skin: Skin is warm and dry. Rash noted.  Erythematous linear rash noted to right lower lateral leg  Psychiatric: He has a normal mood and affect.          Assessment & Plan:  1. Contact dermatitis  - methylPREDNISolone acetate (DEPO-MEDROL) injection 80 mg; Inject 1 mL (80 mg total) into the muscle once. - triamcinolone (KENALOG) 0.025 % cream; Apply 1 application topically 2 (two) times daily. Right lower leg affected area.  Dispense: 30 g; Refill: 0  2. HYPERTENSION  - BMP8+EGFR  3. Unspecified essential hypertension  - BMP8+EGFR -Continue all current medications Labs pending F/u in 3 months and prn Discussed benefits of regular exercise and healthy eating Patient verbalized understanding Erby Pian, FNP-C

## 2013-10-25 NOTE — Patient Instructions (Signed)

## 2013-10-26 LAB — BMP8+EGFR
BUN/Creatinine Ratio: 19 (ref 9–20)
BUN: 18 mg/dL (ref 6–24)
CALCIUM: 9.5 mg/dL (ref 8.7–10.2)
CO2: 19 mmol/L (ref 18–29)
Chloride: 103 mmol/L (ref 97–108)
Creatinine, Ser: 0.93 mg/dL (ref 0.76–1.27)
GFR, EST AFRICAN AMERICAN: 104 mL/min/{1.73_m2} (ref >59)
GFR, EST NON AFRICAN AMERICAN: 90 mL/min/{1.73_m2} (ref >59)
Glucose: 122 mg/dL — ABNORMAL HIGH (ref 65–99)
POTASSIUM: 4.5 mmol/L (ref 3.5–5.2)
SODIUM: 141 mmol/L (ref 134–144)

## 2013-10-30 ENCOUNTER — Ambulatory Visit (INDEPENDENT_AMBULATORY_CARE_PROVIDER_SITE_OTHER): Payer: BC Managed Care – PPO | Admitting: Family

## 2013-10-30 ENCOUNTER — Encounter: Payer: Self-pay | Admitting: Family

## 2013-10-30 ENCOUNTER — Telehealth: Payer: Self-pay | Admitting: General Practice

## 2013-10-30 VITALS — BP 146/80 | HR 90 | Temp 98.2°F | Wt 209.8 lb

## 2013-10-30 DIAGNOSIS — L259 Unspecified contact dermatitis, unspecified cause: Secondary | ICD-10-CM

## 2013-10-30 MED ORDER — TRIAMCINOLONE ACETONIDE 0.1 % EX OINT: 1.0000 "application " | TOPICAL_OINTMENT | Freq: Two times a day (BID) | CUTANEOUS | Status: DC

## 2013-10-30 NOTE — Telephone Encounter (Signed)
appt at 1:45 with [name] [name]

## 2013-10-30 NOTE — Patient Instructions (Signed)

## 2013-10-30 NOTE — Progress Notes (Signed)
Subjective:    Patient ID: [name] [name], male    DOB: [birth year], 58 y.o.   MRN: [id number]  Rash Chronicity: Two weeks. The current episode started 1 to 4 weeks ago. The problem is unchanged. The affected locations include the right lower leg. The rash is characterized by blistering, itchiness and redness. He was exposed to nothing. Pertinent negatives include no diarrhea, eye pain, fever, joint pain, shortness of breath or sore throat. Past treatments include anti-itch cream (IM steroids). The treatment provided mild relief. There is no history of asthma.   *Pt was seen in office last Wednesday and rx kenalog cream 0.025%. Pt states it has helped the itching but states it "looks the same."   Review of Systems  Constitutional: Negative.  Negative for fever.  HENT: Negative.  Negative for sore throat.   Eyes: Negative.  Negative for pain.  Respiratory: Negative.  Negative for shortness of breath.   Gastrointestinal: Negative for diarrhea.  Endocrine: Negative.   Genitourinary: Negative.   Musculoskeletal: Negative for joint pain.  Skin: Positive for rash.  Allergic/Immunologic: Negative.   Neurological: Negative.   Psychiatric/Behavioral: Negative.   All other systems reviewed and are negative.      Objective:   Physical Exam  Vitals reviewed. Constitutional: He is oriented to person, place, and time. He appears well-developed and well-nourished. No distress.  HENT:  Head: Normocephalic.  Right Ear: External ear normal.  Left Ear: External ear normal.  Mouth/Throat: Oropharynx is clear and moist.  Eyes: Pupils are equal, round, and reactive to light. Right eye exhibits no discharge. Left eye exhibits no discharge.  Neck: Normal range of motion. Neck supple. No thyromegaly present.  Cardiovascular: Normal rate, regular rhythm, normal heart sounds and intact distal pulses.   No murmur heard. Pulmonary/Chest: Effort normal and breath sounds normal. No respiratory distress. He  has no wheezes.  Abdominal: Soft. Bowel sounds are normal. He exhibits no distension. There is no tenderness.  Musculoskeletal: Normal range of motion. He exhibits no edema and no tenderness.  Neurological: He is alert and oriented to person, place, and time. He has normal reflexes. No cranial nerve deficit.  Skin: Skin is warm and dry. Rash noted. There is erythema.  Erythemas rash present on right outer calf, blisters present in center of rash.  Psychiatric: He has a normal mood and affect. His behavior is normal. Judgment and thought content normal.    BP 146/80  Pulse 90  Temp(Src) 98.2 F (36.8 C) (Oral)  Wt 209 lb 12.8 oz (95.165 kg)       Assessment & Plan:  1. Contact dermatitis  Meds ordered this encounter  Medications  . triamcinolone ointment (KENALOG) 0.1 %    Sig: Apply 1 application topically 2 (two) times daily.    Dispense:  30 g    Refill:  0    Order Specific Question:  Supervising Provider    Answer:  DREYDAN, SOTTO [[id number]]   Medication increased from 0.025% to 0.1%. Keep leg moisturized Avoid any contact to irritants RTO in 3-4 weeks if not improved  Jannifer Rodney, FNP

## 2013-11-07 ENCOUNTER — Encounter: Payer: Self-pay | Admitting: *Deleted

## 2013-11-22 ENCOUNTER — Ambulatory Visit: Payer: BC Managed Care – PPO | Admitting: Family Medicine

## 2013-11-24 ENCOUNTER — Ambulatory Visit (INDEPENDENT_AMBULATORY_CARE_PROVIDER_SITE_OTHER): Payer: BC Managed Care – PPO | Admitting: Family Medicine

## 2013-11-24 ENCOUNTER — Encounter: Payer: Self-pay | Admitting: Family Medicine

## 2013-11-24 VITALS — BP 146/85 | HR 87 | Temp 98.5°F | Ht 70.0 in | Wt 211.0 lb

## 2013-11-24 DIAGNOSIS — G4700 Insomnia, unspecified: Secondary | ICD-10-CM

## 2013-11-24 DIAGNOSIS — N400 Enlarged prostate without lower urinary tract symptoms: Secondary | ICD-10-CM

## 2013-11-24 DIAGNOSIS — E785 Hyperlipidemia, unspecified: Secondary | ICD-10-CM

## 2013-11-24 DIAGNOSIS — E559 Vitamin D deficiency, unspecified: Secondary | ICD-10-CM

## 2013-11-24 DIAGNOSIS — Z23 Encounter for immunization: Secondary | ICD-10-CM

## 2013-11-24 DIAGNOSIS — E291 Testicular hypofunction: Secondary | ICD-10-CM

## 2013-11-24 DIAGNOSIS — E349 Endocrine disorder, unspecified: Secondary | ICD-10-CM

## 2013-11-24 DIAGNOSIS — I10 Essential (primary) hypertension: Secondary | ICD-10-CM

## 2013-11-24 DIAGNOSIS — E119 Type 2 diabetes mellitus without complications: Secondary | ICD-10-CM

## 2013-11-24 DIAGNOSIS — Z87891 Personal history of nicotine dependence: Secondary | ICD-10-CM

## 2013-11-24 MED ORDER — TEMAZEPAM 15 MG PO CAPS: 15.0000 mg | ORAL_CAPSULE | Freq: Every evening | ORAL | Status: DC | PRN

## 2013-11-24 NOTE — Patient Instructions (Addendum)
Continue current medications. Continue good therapeutic lifestyle changes which include good diet and exercise. Fall precautions discussed with patient. If an FOBT was given today- please return it to our front desk. If you are over 59 years old - you may need Prevnar 13 or the adult Pneumonia vaccine.  Reduce caffeine intake Try taking Zantac 150 mg at bedtime nightly Limit milk cheese ice cream and dairy products Use sunscreen on neck, if the redness does not resolve we will consider referring him to a dermatologist Continue to monitor blood pressures at home When you return for lab work, let us know how you're insomnia is doing and how your stomach is doing as far as loose bowel movements are concerned Also continue to use your flonase The patient should use sunscreen on the anterior neck and upper chest

## 2013-11-24 NOTE — Progress Notes (Signed)
Subjective:    Patient ID: [name] [name], male    DOB: December 12, 1955, 58 y.o.   MRN: [id number]  HPI Patient comes in today for routine follow up on chronic medical conditions. He also complains of persistent skin redness on his exposed neck and upper chest area. Additionally mentioned that his cardiologist increased his blood pressure medication but he was unable to tolerate that dose and went back to taking the original strength. He has a record with him today.  The patient retired in April from his job. As in the review of systems he has multiple complaints and problems. After carefully talking with him I believe a lot of the issues could be do to his increased caffeine intake. It has been 6 months since he had his last testosterone PSA and rectal exam and this will be done with the blood work which he will come back for next week before 10:00 in the morning   Review of Systems  Constitutional: Negative.   HENT: Negative.   Eyes: Negative.   Respiratory: Negative.   Cardiovascular: Negative.   Gastrointestinal: Positive for vomiting (one episode last week but patient feels that he had a GI virus), abdominal pain (burning sensation in the morning that improves as the day goes on), diarrhea (2 days out of the week that he can't relate to diet or anything else) and constipation. Negative for nausea and blood in stool.       Bloating  Endocrine: Negative.   Genitourinary: Negative.   Musculoskeletal: Negative.   Skin: Color change: persisent redness upper chest and neck. Began after being treated with doxycycline for MRSA.  Allergic/Immunologic: Negative.   Neurological: Negative.   Hematological: Negative.   Psychiatric/Behavioral: Positive for sleep disturbance (insomnia).       Objective:   Physical Exam  Nursing note and vitals reviewed. Constitutional: He is oriented to person, place, and time. He appears well-developed and well-nourished. No distress.  HENT:  Head: Normocephalic  and atraumatic.  Right Ear: External ear normal.  Left Ear: External ear normal.  Nose: Nose normal.  Mouth/Throat: Oropharynx is clear and moist. No oropharyngeal exudate.  Eyes: Conjunctivae and EOM are normal. Pupils are equal, round, and reactive to light. Right eye exhibits no discharge. Left eye exhibits no discharge. No scleral icterus.  Neck: Normal range of motion. Neck supple. No thyromegaly present.  No carotid bruits  Cardiovascular: Normal rate, regular rhythm, normal heart sounds and intact distal pulses.  Exam reveals no gallop and no friction rub.   No murmur heard. At 72 per minute  Pulmonary/Chest: Effort normal and breath sounds normal. No respiratory distress. He has no wheezes. He has no rales. He exhibits no tenderness.  No axillary adenopathy  Abdominal: Soft. Bowel sounds are normal. He exhibits no mass. There is no tenderness. There is no rebound and no guarding.  Genitourinary: Rectum normal and penis normal.  The prostate gland was slightly enlarged without any lumps or masses. There were no rectal masses. There was no inguinal hernia. There were no inguinal nodes. The external genitalia were within normal limits  Musculoskeletal: Normal range of motion. He exhibits no edema and no tenderness.  Lymphadenopathy:    He has no cervical adenopathy.  Neurological: He is alert and oriented to person, place, and time. He has normal reflexes. No cranial nerve deficit.  Skin: Skin is warm and dry. No rash noted. There is erythema. No pallor.  There was erythema on the anterior neck and in a  V-shaped pattern in the open collar area. This has been noticeable since he took the doxycycline for months ago.  Psychiatric: He has a normal mood and affect. His behavior is normal. Judgment and thought content normal.    BP 146/85  Pulse 87  Temp(Src) 98.5 F (36.9 C)  Ht $R'5\' 10"'aC$  (1.778 m)  Wt 211 lb (95.709 kg)  BMI 30.28 kg/m2       Assessment & Plan:  1. Diabetes - POCT  glycosylated hemoglobin (Hb A1C)  2. Vitamin D deficiency - Vit D  25 hydroxy (rtn osteoporosis monitoring)  3. Testosterone deficiency  4. Hypertension - POCT CBC - BMP8+EGFR -Continue lisinopril 20 mg daily  5. Hyperlipidemia - Hepatic function panel - Lipid panel  6. Insomnia  7. BPH (benign prostatic hyperplasia)  Patient Instructions  Continue current medications. Continue good therapeutic lifestyle changes which include good diet and exercise. Fall precautions discussed with patient. If an FOBT was given today- please return it to our front desk. If you are over 77 years old - you may need Prevnar 64 or the adult Pneumonia vaccine.  Reduce caffeine intake Try taking Zantac 150 mg at bedtime nightly Limit milk cheese ice cream and dairy products Use sunscreen on neck, if the redness does not resolve we will consider referring him to a dermatologist Continue to monitor blood pressures at home When you return for lab work, let us know how you're insomnia is doing and how your stomach is doing as far as loose bowel movements are concerned Also continue to use your flonase The patient should use sunscreen on the anterior neck and upper chest   Arrie Senate MD

## 2013-11-29 ENCOUNTER — Ambulatory Visit (INDEPENDENT_AMBULATORY_CARE_PROVIDER_SITE_OTHER): Payer: BC Managed Care – PPO

## 2013-11-29 ENCOUNTER — Other Ambulatory Visit: Payer: BC Managed Care – PPO

## 2013-11-29 DIAGNOSIS — I10 Essential (primary) hypertension: Secondary | ICD-10-CM

## 2013-11-29 DIAGNOSIS — Z87891 Personal history of nicotine dependence: Secondary | ICD-10-CM

## 2013-11-29 LAB — POCT CBC
GRANULOCYTE PERCENT: 73.5 % (ref 37–80)
HCT, POC: 41.2 % — AB (ref 43.5–53.7)
HEMOGLOBIN: 13.5 g/dL — AB (ref 14.1–18.1)
Lymph, poc: 1.6 (ref 0.6–3.4)
MCH, POC: 31.3 pg — AB (ref 27–31.2)
MCHC: 32.7 g/dL (ref 31.8–35.4)
MCV: 95.9 fL (ref 80–97)
MPV: 7.2 fL (ref 0–99.8)
POC GRANULOCYTE: 5.8 (ref 2–6.9)
POC LYMPH PERCENT: 20.7 %L (ref 10–50)
Platelet Count, POC: 194 10*3/uL (ref 142–424)
RBC: 4.3 M/uL — AB (ref 4.69–6.13)
RDW, POC: 12.8 %
WBC: 7.9 10*3/uL (ref 4.6–10.2)

## 2013-11-29 LAB — POCT GLYCOSYLATED HEMOGLOBIN (HGB A1C)

## 2013-11-30 LAB — BMP8+EGFR
BUN/Creatinine Ratio: 25 — ABNORMAL HIGH (ref 9–20)
BUN: 24 mg/dL (ref 6–24)
CO2: 23 mmol/L (ref 18–29)
Calcium: 9.6 mg/dL (ref 8.7–10.2)
Chloride: 98 mmol/L (ref 97–108)
Creatinine, Ser: 0.97 mg/dL (ref 0.76–1.27)
GFR, EST AFRICAN AMERICAN: 99 mL/min/{1.73_m2} (ref >59)
GFR, EST NON AFRICAN AMERICAN: 86 mL/min/{1.73_m2} (ref >59)
GLUCOSE: 160 mg/dL — AB (ref 65–99)
Potassium: 4.6 mmol/L (ref 3.5–5.2)
Sodium: 139 mmol/L (ref 134–144)

## 2013-11-30 LAB — VITAMIN D 25 HYDROXY (VIT D DEFICIENCY, FRACTURES): VIT D 25 HYDROXY: 43.7 ng/mL (ref 30.0–100.0)

## 2013-11-30 LAB — HEPATIC FUNCTION PANEL
ALBUMIN: 4.8 g/dL (ref 3.5–5.5)
ALT: 64 IU/L — ABNORMAL HIGH (ref 0–44)
AST: 35 IU/L (ref 0–40)
Alkaline Phosphatase: 80 IU/L (ref 39–117)
BILIRUBIN TOTAL: 0.6 mg/dL (ref 0.0–1.2)
Bilirubin, Direct: 0.18 mg/dL (ref 0.00–0.40)
Total Protein: 7 g/dL (ref 6.0–8.5)

## 2013-11-30 LAB — LIPID PANEL
Chol/HDL Ratio: 2.6 ratio units (ref 0.0–5.0)
Cholesterol, Total: 84 mg/dL — ABNORMAL LOW (ref 100–199)
HDL: 32 mg/dL — AB (ref >39)
LDL CALC: 41 mg/dL (ref 0–99)
Triglycerides: 55 mg/dL (ref 0–149)
VLDL CHOLESTEROL CAL: 11 mg/dL (ref 5–40)

## 2013-12-01 ENCOUNTER — Telehealth: Payer: Self-pay

## 2013-12-01 NOTE — Telephone Encounter (Signed)
Message copied by Roselee Culver on Fri Dec 01, 2013 10:56 AM ------      Message from: Ernestina Penna      Created: Wed Nov 29, 2013  4:09 PM       As per radiology report ------

## 2013-12-01 NOTE — Telephone Encounter (Signed)
Pt aware of CXR results.

## 2013-12-14 ENCOUNTER — Encounter: Payer: Self-pay | Admitting: Pharmacist

## 2013-12-14 ENCOUNTER — Ambulatory Visit (INDEPENDENT_AMBULATORY_CARE_PROVIDER_SITE_OTHER): Payer: BC Managed Care – PPO | Admitting: Pharmacist

## 2013-12-14 ENCOUNTER — Telehealth: Payer: Self-pay | Admitting: Pharmacist

## 2013-12-14 VITALS — BP 138/74 | HR 70 | Ht 70.0 in | Wt 214.0 lb

## 2013-12-14 DIAGNOSIS — E785 Hyperlipidemia, unspecified: Secondary | ICD-10-CM

## 2013-12-14 DIAGNOSIS — E119 Type 2 diabetes mellitus without complications: Secondary | ICD-10-CM

## 2013-12-14 MED ORDER — SITAGLIPTIN PHOSPHATE 100 MG PO TABS: 100.0000 mg | ORAL_TABLET | Freq: Every day | ORAL | Status: DC

## 2013-12-14 NOTE — Progress Notes (Signed)
Diabetes Follow-Up Visit Chief Complaint:   Chief Complaint  Patient presents with  . Diabetes     Filed Vitals:   12/14/13 1155  BP: 138/74  Pulse: 70     HPI: patient initially diagnosed with type 2 DM about 10 to 12 years ago.  Most recent A1c was 7.9% (up from 7.5% in January 2015). He has notice an increase in both FBG and post prandial BG  Current Diabetes Medications:  glipizide (Glucotrol), metformin (generic)  Home BG Monitoring:  Checking 1 times a day. Average:  150  Fasting in am: 140-160   After meals: 200  Low fat/carbohydrate diet?  Yes - but some room for improvement Nicotine Abuse?  No Medication Compliance?  Yes Exercise?  Yes - 30 minutes treadmill and 30 minutes stationary bike Alcohol Abuse?  No   Lab Results  Component Value Date   HGBA1C 7.9% 11/29/2013    No results found for this basename: Concepcion Elk    Lab Results  Component Value Date   CHOL 69 04/12/2013   HDL 32* 11/29/2013   LDLCALC 41 11/29/2013   TRIG 55 11/29/2013   CHOLHDL 2.6 11/29/2013      Assessment: 1.  Diabetes.  Not at goal 2.  Blood Pressure.  At goal 3.  Lipids.  Tg and LDL at goal but low HDL   Recommendations: 1.  Medication recommendations at this time are as follows:  Discontinue glipizide, start Januvia 100mg  1 tablet daily  Continue metformin 500mg  BID 2.  Reviewed HBG goals:  Fasting 80-130 and 1-2 hour post prandial <180.  Patient is instructed to check BG 1 times per day.    3.  BP goal < 140/85. 4.  LDL goal of < 100, HDL > 40 and TG < 150.  5.  Return to clinic in 4-6 wks   Time spent counseling patient:  30 minutes  Referring provider:  Burnett Kanaris, PharmD, CPP

## 2013-12-14 NOTE — Telephone Encounter (Signed)
Called to [facility]. Amoxicillin 500mg  take 4 tabs 1 hour before procedure.

## 2013-12-14 NOTE — Patient Instructions (Signed)
Stop glipizide Start [place] 100mg  1 tablet daily Continue metformin 500mg  1 tablet twice a day with food

## 2013-12-14 NOTE — Telephone Encounter (Signed)
Okay amoxicillin 500 for prophylaxis per the pharmacist

## 2013-12-25 ENCOUNTER — Other Ambulatory Visit (INDEPENDENT_AMBULATORY_CARE_PROVIDER_SITE_OTHER): Payer: BC Managed Care – PPO

## 2013-12-25 DIAGNOSIS — R945 Abnormal results of liver function studies: Principal | ICD-10-CM

## 2013-12-25 DIAGNOSIS — R7989 Other specified abnormal findings of blood chemistry: Secondary | ICD-10-CM

## 2013-12-26 LAB — HEPATIC FUNCTION PANEL
ALT: 62 IU/L — AB (ref 0–44)
AST: 43 IU/L — ABNORMAL HIGH (ref 0–40)
Albumin: 4.7 g/dL (ref 3.5–5.5)
Alkaline Phosphatase: 76 IU/L (ref 39–117)
Bilirubin, Direct: 0.18 mg/dL (ref 0.00–0.40)
Total Bilirubin: 0.5 mg/dL (ref 0.0–1.2)
Total Protein: 6.9 g/dL (ref 6.0–8.5)

## 2014-01-09 ENCOUNTER — Ambulatory Visit: Payer: Self-pay

## 2014-01-11 ENCOUNTER — Telehealth: Payer: Self-pay | Admitting: *Deleted

## 2014-01-11 DIAGNOSIS — K760 Fatty (change of) liver, not elsewhere classified: Secondary | ICD-10-CM

## 2014-01-11 DIAGNOSIS — R935 Abnormal findings on diagnostic imaging of other abdominal regions, including retroperitoneum: Secondary | ICD-10-CM

## 2014-01-12 NOTE — Telephone Encounter (Signed)
[facility] faxing over

## 2014-01-13 ENCOUNTER — Encounter: Payer: Self-pay | Admitting: Family Medicine

## 2014-01-22 ENCOUNTER — Encounter: Payer: Self-pay | Admitting: Pharmacist

## 2014-01-22 ENCOUNTER — Ambulatory Visit (INDEPENDENT_AMBULATORY_CARE_PROVIDER_SITE_OTHER): Payer: BC Managed Care – PPO | Admitting: Pharmacist

## 2014-01-22 VITALS — BP 132/74 | HR 77 | Ht 70.0 in | Wt 214.0 lb

## 2014-01-22 DIAGNOSIS — E119 Type 2 diabetes mellitus without complications: Secondary | ICD-10-CM

## 2014-01-22 DIAGNOSIS — E785 Hyperlipidemia, unspecified: Secondary | ICD-10-CM

## 2014-01-22 DIAGNOSIS — I10 Essential (primary) hypertension: Secondary | ICD-10-CM

## 2014-01-22 DIAGNOSIS — R109 Unspecified abdominal pain: Secondary | ICD-10-CM

## 2014-01-22 NOTE — Progress Notes (Signed)
Diabetes Follow-Up Visit Chief Complaint:   Chief Complaint  Patient presents with  . Diabetes     Filed Vitals:   01/22/14 0925  BP: 132/74  Pulse: 77     HPI: patient initially diagnosed with type 2 DM about 10 to 12 years ago.  Most recent A1c was 7.9% (up from 7.5% in January 2015). He was seen about 4-6 weeks ago and Januvia was started and glipizide stopped.  A few weeks after change he called and BG was elevated in 200's and it was suggested that he restart glipizide.  He took for  1-2 days and then stopped because it was causing bloating and nausea. Noted that patient has allergy to sulfonamides and HCTZ.  Although there is little documentation of cross sensitivity between sulfonylureas and sulfonamide / HCTZ allergy due to similar chemical structure someone allergic to sulfonamide could have a reaction to sulfonylureas.    Current Diabetes Medications:  Januvia 100mg  QD and metformin 500mg  BID  Home BG Monitoring:  Checking 1 times a day. Average:  200's Fasting in am: 200's   Highest - 200's  Low fat/carbohydrate diet?  Yes  Nicotine Abuse?  No Medication Compliance?  Yes Exercise?  Yes - 30 minutes treadmill and 30 minutes stationary bike Alcohol Abuse?  No   Lab Results  Component Value Date   HGBA1C 7.9% 11/29/2013    No results found for this basename: Concepcion ElkMICROALBUR,  [id number]    Lab Results  Component Value Date   CHOL 69 04/12/2013   HDL 32* 11/29/2013   LDLCALC 41 11/29/2013   TRIG 55 11/29/2013   CHOLHDL 2.6 11/29/2013      Assessment: 1.  Diabetes.  Not at goal - possible intolerance to sulfonylureas 2.  Blood Pressure.  At goal 3.  Lipids.  Tg and LDL at goal but low HDL 4.  Abdominal pain - patient to get CT today (U/S was inconclusive)   Recommendations: 1.  Medication recommendations at this time are as follows:    Contineu Januvia 100mg  1 tablet daily  Continue metformin 500mg  BID  Add invokana 100mg  1 tablet qam- gave #20 samples 2.   Reviewed HBG goals:  Fasting 80-130 and 1-2 hour post prandial <180.  Patient is instructed to check BG 1 times per day.    3.  BP goal < 140/85. 4.  LDL goal of < 100, HDL > 40 and TG < 150.  5.  Return to clinic in 4-6 wks  Orders Placed This Encounter  Procedures  . Amylase  . Lipase  . Lactic Acid, Plasma    Time spent counseling patient:  30 minutes  Referring provider:  Burnett KanarisMoore  Zuha Dejonge, PharmD, CPP, CDE

## 2014-01-23 LAB — LIPASE: Lipase: 67 U/L — ABNORMAL HIGH (ref 0–59)

## 2014-01-23 LAB — AMYLASE: AMYLASE: 83 U/L (ref 31–124)

## 2014-01-23 LAB — LACTIC ACID, PLASMA: LACTATE: 16.9 mg/dL (ref 4.5–19.8)

## 2014-01-25 ENCOUNTER — Telehealth: Payer: Self-pay | Admitting: Pharmacist

## 2014-01-25 NOTE — Telephone Encounter (Signed)
Patient's lipase was elevated - amylase and lactic acid normal.  He has not heard results from abdominal CT [facility] which could give us a clue why lipase if elevated.  Spoke with Berton LanJamie Bullins [facility] and she is going to check on result.

## 2014-01-26 ENCOUNTER — Other Ambulatory Visit: Payer: Self-pay | Admitting: Pharmacist

## 2014-01-26 ENCOUNTER — Ambulatory Visit: Payer: BC Managed Care – PPO | Admitting: Family Medicine

## 2014-01-26 ENCOUNTER — Other Ambulatory Visit: Payer: Self-pay | Admitting: *Deleted

## 2014-01-26 DIAGNOSIS — R198 Other specified symptoms and signs involving the digestive system and abdomen: Secondary | ICD-10-CM

## 2014-01-26 DIAGNOSIS — R140 Abdominal distension (gaseous): Secondary | ICD-10-CM

## 2014-01-26 NOTE — Telephone Encounter (Signed)
Pt coming in today for [facility] to go over results

## 2014-01-26 NOTE — Progress Notes (Signed)
Patient's Abdominal CT did not show anything significant except kidney stones.  His abdominal pain is better but Lipase was elevated.  Dr Christell ConstantMoore has made referral to GI for evaluation.  Due to possible effects on pancrease we are stopping [place]uvia.  Will not start Invokana until after GI appt  Restart glipiride 5mg  1/2 tablet with breakfast and supper.  Contnue metformin 500mg  bid.

## 2014-01-29 ENCOUNTER — Other Ambulatory Visit: Payer: Self-pay | Admitting: *Deleted

## 2014-01-29 DIAGNOSIS — R935 Abnormal findings on diagnostic imaging of other abdominal regions, including retroperitoneum: Secondary | ICD-10-CM

## 2014-01-29 DIAGNOSIS — R7989 Other specified abnormal findings of blood chemistry: Secondary | ICD-10-CM

## 2014-01-29 DIAGNOSIS — R945 Abnormal results of liver function studies: Principal | ICD-10-CM

## 2014-01-29 DIAGNOSIS — K760 Fatty (change of) liver, not elsewhere classified: Secondary | ICD-10-CM

## 2014-02-02 ENCOUNTER — Encounter: Payer: Self-pay | Admitting: Family Medicine

## 2014-02-04 ENCOUNTER — Encounter: Payer: Self-pay | Admitting: Family Medicine

## 2014-02-07 ENCOUNTER — Encounter (INDEPENDENT_AMBULATORY_CARE_PROVIDER_SITE_OTHER): Payer: Self-pay | Admitting: *Deleted

## 2014-02-16 ENCOUNTER — Other Ambulatory Visit: Payer: Self-pay | Admitting: Family Medicine

## 2014-02-28 ENCOUNTER — Encounter: Payer: Self-pay | Admitting: Family Medicine

## 2014-02-28 ENCOUNTER — Ambulatory Visit (INDEPENDENT_AMBULATORY_CARE_PROVIDER_SITE_OTHER): Payer: BC Managed Care – PPO | Admitting: Family Medicine

## 2014-02-28 VITALS — BP 134/76 | HR 77 | Temp 99.1°F | Ht 70.0 in | Wt 211.0 lb

## 2014-02-28 DIAGNOSIS — E119 Type 2 diabetes mellitus without complications: Secondary | ICD-10-CM

## 2014-02-28 DIAGNOSIS — E559 Vitamin D deficiency, unspecified: Secondary | ICD-10-CM

## 2014-02-28 DIAGNOSIS — I712 Thoracic aortic aneurysm, without rupture, unspecified: Secondary | ICD-10-CM

## 2014-02-28 DIAGNOSIS — E349 Endocrine disorder, unspecified: Secondary | ICD-10-CM

## 2014-02-28 DIAGNOSIS — I499 Cardiac arrhythmia, unspecified: Secondary | ICD-10-CM

## 2014-02-28 DIAGNOSIS — I259 Chronic ischemic heart disease, unspecified: Secondary | ICD-10-CM

## 2014-02-28 DIAGNOSIS — I7121 Aneurysm of the ascending aorta, without rupture: Secondary | ICD-10-CM

## 2014-02-28 DIAGNOSIS — I10 Essential (primary) hypertension: Secondary | ICD-10-CM

## 2014-02-28 DIAGNOSIS — Z8679 Personal history of other diseases of the circulatory system: Secondary | ICD-10-CM

## 2014-02-28 DIAGNOSIS — E785 Hyperlipidemia, unspecified: Secondary | ICD-10-CM

## 2014-02-28 DIAGNOSIS — E291 Testicular hypofunction: Secondary | ICD-10-CM

## 2014-02-28 LAB — POCT CBC
Granulocyte percent: 69.6 %G (ref 37–80)
HCT, POC: 44.9 % (ref 43.5–53.7)
Hemoglobin: 15 g/dL (ref 14.1–18.1)
Lymph, poc: 1.8 (ref 0.6–3.4)
MCH: 32.5 pg — AB (ref 27–31.2)
MCHC: 33.4 g/dL (ref 31.8–35.4)
MCV: 97 fL (ref 80–97)
MPV: 7.9 fL (ref 0–99.8)
POC Granulocyte: 4.5 (ref 2–6.9)
POC LYMPH PERCENT: 27.2 %L (ref 10–50)
Platelet Count, POC: 184 10*3/uL (ref 142–424)
RBC: 4.6 M/uL — AB (ref 4.69–6.13)
RDW, POC: 12.6 %
WBC: 6.5 10*3/uL (ref 4.6–10.2)

## 2014-02-28 LAB — POCT UA - MICROSCOPIC ONLY
BACTERIA, U MICROSCOPIC: NEGATIVE
Casts, Ur, LPF, POC: NEGATIVE
Crystals, Ur, HPF, POC: NEGATIVE
Epithelial cells, urine per micros: NEGATIVE
RBC, urine, microscopic: NEGATIVE
WBC, Ur, HPF, POC: NEGATIVE
Yeast, UA: NEGATIVE

## 2014-02-28 LAB — POCT URINALYSIS DIPSTICK
Bilirubin, UA: NEGATIVE
Glucose, UA: NEGATIVE
Ketones, UA: NEGATIVE
Leukocytes, UA: NEGATIVE
Nitrite, UA: NEGATIVE
RBC UA: NEGATIVE
Spec Grav, UA: >=1.03
Urobilinogen, UA: NEGATIVE
pH, UA: 5

## 2014-02-28 LAB — POCT GLYCOSYLATED HEMOGLOBIN (HGB A1C): Hemoglobin A1C: 8.2

## 2014-02-28 NOTE — Progress Notes (Signed)
Subjective:    Patient ID: [name] [name], male    DOB: 1956-05-15, 58 y.o.   MRN: [id number]  HPI Pt here for follow up and management of chronic medical problems. The patient brings in his blood sugars for review today. The fasting blood sugars are running in the 160-200 range. The nighttime blood sugars are also running in the 200 range. The patient sees a cardiologist regularly. He has also been working with the clinical pharmacist to get his blood sugar under better control.       Patient Active Problem List   Diagnosis Date Noted  . Ascending aortic aneurysm 12/12/2012  . INSOMNIA 09/15/2010  . ATRIAL FIBRILLATION, PAROXYSMAL, HX OF 09/15/2010  . CORONARY ARTERY DISEASE, S/P PTCA 07/09/2009  . AORTIC VALVE DISORDERS 05/27/2009  . OSTEOARTHRITIS, LOCALIZED 05/27/2009  . DM 05/24/2009  . HYPERLIPIDEMIA 05/24/2009  . Hypokalemia 05/24/2009  . HYPERTENSION 05/24/2009  . UNSPECIFIED SLEEP APNEA 05/24/2009   Outpatient Encounter Prescriptions as of 02/28/2014  Medication Sig  . ANDROGEL PUMP 20.25 MG/ACT (1.62%) GEL APPLY 3 PUMPS TOPICALLY EVERY DAY  . aspirin (SB LOW DOSE ASA EC) 81 MG EC tablet Take 81 mg by mouth daily.    [name] atorvastatin (LIPITOR) 40 MG tablet Take 1 tablet (40 mg total) by mouth daily.  . cholecalciferol (VITAMIN D) 1000 UNITS tablet Take 2,000 Units by mouth daily.   . fluticasone (FLONASE) 50 MCG/ACT nasal spray Place 2 sprays into the nose daily as needed.  [name] glipiZIDE (GLUCOTROL) 5 MG tablet Take 0.5 tablets (2.5 mg total) by mouth 2 (two) times daily before a meal.  . lisinopril (PRINIVIL,ZESTRIL) 40 MG tablet Take 20 mg by mouth daily.  . metFORMIN (GLUCOPHAGE) 500 MG tablet Take 1 tablet (500 mg total) by mouth 2 (two) times daily with a meal.  . niacin (NIASPAN) 1000 MG CR tablet Take 1 tablet (1,000 mg total) by mouth at bedtime.  [name] oxyCODONE-acetaminophen (PERCOCET/ROXICET) 5-325 MG per tablet Take 1 tablet by mouth every 6 (six) hours as needed.    . promethazine (PHENERGAN) 25 MG tablet Take 1 tablet by mouth every 4 (four) hours as needed.  . temazepam (RESTORIL) 15 MG capsule Take 1 capsule (15 mg total) by mouth at bedtime as needed.  . [DISCONTINUED] triamcinolone ointment (KENALOG) 0.1 % Apply topically daily.    Review of Systems  Constitutional: Negative.   HENT: Negative.   Eyes: Negative.   Respiratory: Negative.   Cardiovascular: Negative.   Gastrointestinal: Negative.   Endocrine: Negative.        Blood sugars running higher than normal  Genitourinary: Negative.   Musculoskeletal: Negative.   Skin: Negative.   Allergic/Immunologic: Negative.   Neurological: Negative.   Hematological: Negative.   Psychiatric/Behavioral: Negative.        Objective:   Physical Exam  Nursing note and vitals reviewed. Constitutional: He is oriented to person, place, and time. He appears well-developed and well-nourished. No distress.  HENT:  Head: Normocephalic and atraumatic.  Right Ear: External ear normal.  Left Ear: External ear normal.  Nose: Nose normal.  Mouth/Throat: Oropharynx is clear and moist. No oropharyngeal exudate.  Eyes: Conjunctivae and EOM are normal. Pupils are equal, round, and reactive to light. Right eye exhibits no discharge. Left eye exhibits no discharge. No scleral icterus.  Neck: Normal range of motion. Neck supple. No tracheal deviation present. No thyromegaly present.  There were no carotid bruits  Cardiovascular: Normal rate, normal heart sounds and intact distal pulses.  Exam reveals no gallop and no friction rub.   No murmur heard. The heart was slightly irregular at 72 per minute  Pulmonary/Chest: Effort normal and breath sounds normal. No respiratory distress. He has no wheezes. He has no rales. He exhibits no tenderness.  Abdominal: Soft. Bowel sounds are normal. He exhibits no mass. There is no tenderness. There is no rebound and no guarding.  Genitourinary: Rectum normal and penis normal.   The prostate was slightly enlarged without lumps or masses. There was no rectal mass. There were no inguinal hernias palpable.  Musculoskeletal: Normal range of motion. He exhibits no edema and no tenderness.  Lymphadenopathy:    He has no cervical adenopathy.  Neurological: He is alert and oriented to person, place, and time. He has normal reflexes. No cranial nerve deficit.  Skin: Skin is warm and dry. No rash noted. No erythema. No pallor.  Psychiatric: He has a normal mood and affect. His behavior is normal. Judgment and thought content normal.    BP 134/76  Pulse 77  Temp(Src) 99.1 F (37.3 C) (Oral)  Ht _0  (1.778 m)  Wt 211 lb (95.709 kg)  BMI 30.28 kg/m2       Assessment & Plan:  1. ATRIAL FIBRILLATION, PAROXYSMAL, HX OF - POCT CBC  2. CORONARY ARTERY DISEASE, S/P PTCA - POCT CBC  3. DM - POCT CBC - POCT glycosylated hemoglobin (Hb A1C)  4. HYPERLIPIDEMIA - POCT CBC - NMR, lipoprofile  5. HYPERTENSION - POCT CBC - BMP8+EGFR - Hepatic function panel  6. Testosterone deficiency - POCT CBC - POCT UA - Microscopic Only - POCT urinalysis dipstick - PSA, total and free - Testosterone,Free and Total  7. Vitamin D deficiency - Vit D  25 hydroxy (rtn osteoporosis monitoring)  8. Irregular heart beat  9. Ascending aortic aneurysm -This is followed by the cardiologist  No orders of the defined types were placed in this encounter.   Patient Instructions  Continue current medications. Continue good therapeutic lifestyle changes which include good diet and exercise. Fall precautions discussed with patient. If an FOBT was given today- please return it to our front desk. If you are over 73 years old - you may need Prevnar 30 or the adult Pneumonia vaccine.  Flu Shots will be available at our office starting mid- September. Please call and schedule a FLU CLINIC APPOINTMENT.  Try to get more exercise and during the day Increase your metformin to 1000 in  the morning and 500 at night Be sure and schedule an appointment to see Tammy in about 4 weeks and bring your home blood sugars with you at that time Continue to drink plenty of fluids   Arrie Senate MD

## 2014-02-28 NOTE — Patient Instructions (Addendum)
Continue current medications. Continue good therapeutic lifestyle changes which include good diet and exercise. Fall precautions discussed with patient. If an FOBT was given today- please return it to our front desk. If you are over 59 years old - you may need Prevnar 13 or the adult Pneumonia vaccine.  Flu Shots will be available at our office starting mid- September. Please call and schedule a FLU CLINIC APPOINTMENT.  Try to get more exercise and during the day Increase your metformin to 1000 in the morning and 500 at night Be sure and schedule an appointment to see Tammy in about 4 weeks and bring your home blood sugars with you at that time Continue to drink plenty of fluids

## 2014-03-01 LAB — NMR, LIPOPROFILE
Cholesterol: 97 mg/dL — ABNORMAL LOW (ref 100–199)
HDL Cholesterol by NMR: 31 mg/dL — ABNORMAL LOW (ref >39)
HDL Particle Number: 26.8 umol/L — ABNORMAL LOW (ref >=30.5)
LDL PARTICLE NUMBER: 655 nmol/L (ref <1000)
LDL Size: 20 nm (ref >20.5)
LDLC SERPL CALC-MCNC: 50 mg/dL (ref 0–99)
LP-IR SCORE: 67 — AB (ref <=45)
Small LDL Particle Number: 422 nmol/L (ref <=527)
Triglycerides by NMR: 81 mg/dL (ref 0–149)

## 2014-03-01 LAB — BMP8+EGFR
BUN / CREAT RATIO: 15 (ref 9–20)
BUN: 13 mg/dL (ref 6–24)
CHLORIDE: 98 mmol/L (ref 97–108)
CO2: 21 mmol/L (ref 18–29)
Calcium: 9.6 mg/dL (ref 8.7–10.2)
Creatinine, Ser: 0.86 mg/dL (ref 0.76–1.27)
GFR calc non Af Amer: 96 mL/min/{1.73_m2} (ref >59)
GFR, EST AFRICAN AMERICAN: 110 mL/min/{1.73_m2} (ref >59)
GLUCOSE: 164 mg/dL — AB (ref 65–99)
Potassium: 4.3 mmol/L (ref 3.5–5.2)
SODIUM: 141 mmol/L (ref 134–144)

## 2014-03-01 LAB — HEPATIC FUNCTION PANEL
ALK PHOS: 70 IU/L (ref 39–117)
ALT: 73 IU/L — AB (ref 0–44)
AST: 63 IU/L — AB (ref 0–40)
Albumin: 5 g/dL (ref 3.5–5.5)
Bilirubin, Direct: 0.22 mg/dL (ref 0.00–0.40)
Total Bilirubin: 0.7 mg/dL (ref 0.0–1.2)
Total Protein: 7.2 g/dL (ref 6.0–8.5)

## 2014-03-01 LAB — TESTOSTERONE,FREE AND TOTAL
Testosterone, Free: 5.1 pg/mL — ABNORMAL LOW (ref 7.2–24.0)
Testosterone: 239 ng/dL — ABNORMAL LOW (ref 348–1197)

## 2014-03-01 LAB — PSA, TOTAL AND FREE
PSA FREE PCT: 52.5 %
PSA, Free: 0.21 ng/mL
PSA: 0.4 ng/mL (ref 0.0–4.0)

## 2014-03-01 LAB — VITAMIN D 25 HYDROXY (VIT D DEFICIENCY, FRACTURES): Vit D, 25-Hydroxy: 48 ng/mL (ref 30.0–100.0)

## 2014-03-08 ENCOUNTER — Ambulatory Visit (INDEPENDENT_AMBULATORY_CARE_PROVIDER_SITE_OTHER): Payer: BC Managed Care – PPO | Admitting: Internal Medicine

## 2014-03-08 ENCOUNTER — Encounter (INDEPENDENT_AMBULATORY_CARE_PROVIDER_SITE_OTHER): Payer: Self-pay | Admitting: *Deleted

## 2014-03-08 ENCOUNTER — Encounter (INDEPENDENT_AMBULATORY_CARE_PROVIDER_SITE_OTHER): Payer: Self-pay | Admitting: Internal Medicine

## 2014-03-08 VITALS — BP 112/82 | HR 56 | Temp 98.1°F | Ht 70.0 in | Wt 212.9 lb

## 2014-03-08 DIAGNOSIS — R748 Abnormal levels of other serum enzymes: Secondary | ICD-10-CM

## 2014-03-08 DIAGNOSIS — K219 Gastro-esophageal reflux disease without esophagitis: Secondary | ICD-10-CM | POA: Insufficient documentation

## 2014-03-08 DIAGNOSIS — K7689 Other specified diseases of liver: Secondary | ICD-10-CM

## 2014-03-08 DIAGNOSIS — K760 Fatty (change of) liver, not elsewhere classified: Secondary | ICD-10-CM | POA: Insufficient documentation

## 2014-03-08 LAB — HEPATIC FUNCTION PANEL
ALBUMIN: 4.6 g/dL (ref 3.5–5.2)
ALT: 45 U/L (ref 0–53)
AST: 32 U/L (ref 0–37)
Alkaline Phosphatase: 80 U/L (ref 39–117)
Bilirubin, Direct: 0.1 mg/dL (ref 0.0–0.3)
Indirect Bilirubin: 0.3 mg/dL (ref 0.2–1.2)
Total Bilirubin: 0.4 mg/dL (ref 0.2–1.2)
Total Protein: 7.2 g/dL (ref 6.0–8.3)

## 2014-03-08 NOTE — Patient Instructions (Signed)
[place] RUQ, Hepatic function. OV 6 weeks

## 2014-03-08 NOTE — Progress Notes (Addendum)
Subjective:     Patient ID: [name] [name], male   DOB: Dec 04, 1955, 57 y.o.   MRN: [id number]  HPI Referred to our office by Dr. Christell Constant for nausea, bloating. Flatus. He says when he burped it smelled bad. Occurred in June and July and then it resolved. He says it reoccurred last weekend but resolved. He ate chicken tips and onions and peppers.  Takes Zantac once at hs.  Rarely has acid reflux. No burping or bloating now.   No tattoos. Denies prior hx of IV drug use.  Appetite is good. No weight loss. Usually has a BM daily. No melena or BRRB DM2 for over 53yrs. Last HA1C 8.2 last week.     12/29/2013 US abdomen: Increased hepatic echogenicity suggesting steatosis. Focal hepatic abnormalities cannot be excluded due to limitations caused by the degree of sound beam attenuation created by the abnormal echogenicity.  Gallbladder sludge vs cholelithiasis.  Possible mild gallbladder wall thickening focally but not dif CBD 3mm  01/22/2014 CT abdomen/pelvis w/o CM: Liver is diffusely low in attenuation likely secondary to hepatic steatosis. GB unremarkable.   Hepatic Function Panel     Component Value Date/Time   PROT 7.2 02/28/2014 1116   PROT 7.3 12/05/2012 0931   ALBUMIN 4.6 12/05/2012 0931   AST 63* 02/28/2014 1116   ALT 73* 02/28/2014 1116   ALKPHOS 70 02/28/2014 1116   BILITOT 0.7 02/28/2014 1116   BILIDIR 0.22 02/28/2014 1116   IBILI 0.5 12/05/2012 0931      Review of Systems Past Medical History  Diagnosis Date  . OSA on CPAP 2006  . NIDDM (non-insulin dependent diabetes mellitus)     x15 yrs  . Diastolic dysfunction 8/09  . Aortic stenosis 8/09    Bicuspid aortic valve  . CTS (carpal tunnel syndrome)     bilateral   . Hypertension   . Sinusitis   . Nasal congestion   . Hyperlipidemia   . Vitamin D deficiency 06/13/09  . Arthritis 6/10    hand   . Kidney stones   . Paresthesias in left hand 02/19/2004  . Paresthesias in right hand 02/19/2004  . CAD (coronary artery disease) 8/09   . Atrial fibrillation     post op only    Past Surgical History  Procedure Laterality Date  . Coronary angioplasty with stent placement      to rt coronary atery (Dr. Degert-cardiologist)  . Tissue aortic valve replacement      2012  . Carpal tunnel release      bilateral     Allergies  Allergen Reactions  . Hctz [Hydrochlorothiazide]     Nausea and headache   . Sulfa Antibiotics   . Sulfonamide Derivatives     Current Outpatient Prescriptions on File Prior to Visit  Medication Sig Dispense Refill  . ANDROGEL PUMP 20.25 MG/ACT (1.62%) GEL APPLY 3 PUMPS TOPICALLY EVERY DAY  75 g  3  . aspirin (SB LOW DOSE ASA EC) 81 MG EC tablet Take 81 mg by mouth daily.        [name] atorvastatin (LIPITOR) 40 MG tablet Take 1 tablet (40 mg total) by mouth daily.  90 tablet  3  . cholecalciferol (VITAMIN D) 1000 UNITS tablet Take 2,000 Units by mouth daily.       [name] glipiZIDE (GLUCOTROL) 5 MG tablet Take 0.5 tablets (2.5 mg total) by mouth 2 (two) times daily before a meal.  60 tablet  3  . lisinopril (PRINIVIL,ZESTRIL) 40 MG tablet  Take 20 mg by mouth daily.      . niacin (NIASPAN) 1000 MG CR tablet Take 1 tablet (1,000 mg total) by mouth at bedtime.  90 tablet  3  . oxyCODONE-acetaminophen (PERCOCET/ROXICET) 5-325 MG per tablet Take 1 tablet by mouth every 6 (six) hours as needed.      . promethazine (PHENERGAN) 25 MG tablet Take 1 tablet by mouth every 4 (four) hours as needed.      . temazepam (RESTORIL) 15 MG capsule Take 1 capsule (15 mg total) by mouth at bedtime as needed.  30 capsule  1  . fluticasone (FLONASE) 50 MCG/ACT nasal spray Place 2 sprays into the nose daily as needed.       No current facility-administered medications on file prior to visit.        Objective:   Physical Exam  Filed Vitals:   03/08/14 1112  BP: 112/82  Pulse: 56  Temp: 98.1 F (36.7 C)  Height:  (1.778 m)  Weight: 212 lb 14.4 oz (96.571 kg)  Alert and oriented. Skin warm and dry. Oral mucosa is  moist.   . Sclera anicteric, conjunctivae is pink. Thyroid not enlarged. No cervical lymphadenopathy. Lungs clear. Heart regular rate and rhythm.  Abdomen is soft. Bowel sounds are positive. No hepatomegaly. No abdominal masses felt. No tenderness.  No edema to lower extremities.         Assessment:     Fatty liver/ elevated liver enzymes (slight). Hx of diabetes x 15 yrs.  Bloating.     Plan:    GERD: Presently taking Zantac for this. One episode of GERD since July which was food related. Does not want an EGD at this time.  [place] RUQ, Hepatic function,  OV in 2 months.

## 2014-03-13 ENCOUNTER — Ambulatory Visit (HOSPITAL_COMMUNITY)
Admission: RE | Admit: 2014-03-13 | Discharge: 2014-03-13 | Disposition: A | Discharge status: Home / Self Care | Payer: BC Managed Care – PPO | Source: Ambulatory Visit | Attending: Internal Medicine | Admitting: Internal Medicine

## 2014-03-13 DIAGNOSIS — R935 Abnormal findings on diagnostic imaging of other abdominal regions, including retroperitoneum: Secondary | ICD-10-CM | POA: Diagnosis not present

## 2014-03-13 DIAGNOSIS — K760 Fatty (change of) liver, not elsewhere classified: Secondary | ICD-10-CM

## 2014-03-13 DIAGNOSIS — K7689 Other specified diseases of liver: Secondary | ICD-10-CM | POA: Insufficient documentation

## 2014-03-20 ENCOUNTER — Telehealth (INDEPENDENT_AMBULATORY_CARE_PROVIDER_SITE_OTHER): Payer: Self-pay | Admitting: *Deleted

## 2014-03-20 DIAGNOSIS — R748 Abnormal levels of other serum enzymes: Secondary | ICD-10-CM

## 2014-03-20 NOTE — Telephone Encounter (Signed)
.  Per Delrae Rend patient to have done in 3 weeks. Noted for April 10 2014.

## 2014-03-28 ENCOUNTER — Encounter (INDEPENDENT_AMBULATORY_CARE_PROVIDER_SITE_OTHER): Payer: Self-pay | Admitting: *Deleted

## 2014-03-28 ENCOUNTER — Other Ambulatory Visit (INDEPENDENT_AMBULATORY_CARE_PROVIDER_SITE_OTHER): Payer: Self-pay | Admitting: *Deleted

## 2014-03-28 DIAGNOSIS — R748 Abnormal levels of other serum enzymes: Secondary | ICD-10-CM

## 2014-03-29 ENCOUNTER — Encounter: Payer: Self-pay | Admitting: Pharmacist

## 2014-03-29 ENCOUNTER — Ambulatory Visit (INDEPENDENT_AMBULATORY_CARE_PROVIDER_SITE_OTHER): Payer: BC Managed Care – PPO | Admitting: Pharmacist

## 2014-03-29 VITALS — BP 146/78 | HR 72 | Ht 70.0 in | Wt 216.0 lb

## 2014-03-29 DIAGNOSIS — K760 Fatty (change of) liver, not elsewhere classified: Secondary | ICD-10-CM

## 2014-03-29 DIAGNOSIS — E119 Type 2 diabetes mellitus without complications: Secondary | ICD-10-CM

## 2014-03-29 DIAGNOSIS — K7689 Other specified diseases of liver: Secondary | ICD-10-CM

## 2014-03-29 DIAGNOSIS — I10 Essential (primary) hypertension: Secondary | ICD-10-CM

## 2014-03-29 MED ORDER — METFORMIN HCL 1000 MG PO TABS: 1000.0000 mg | ORAL_TABLET | Freq: Two times a day (BID) | ORAL | Status: DC

## 2014-03-29 NOTE — Progress Notes (Signed)
Diabetes Follow-Up Visit Chief Complaint:   Chief Complaint  Patient presents with  . Diabetes     Filed Vitals:   03/29/14 1518  BP: 146/78  Pulse: 72     HPI: patient initially diagnosed with type 2 DM about 10 to 12 years ago.  Most recent A1c was 8.2% (up from 7.5% in January 2015). He was seen about 4-6 weeks ago.  At that time he was having some abdominal pain and has since been worked up by GI.  Per NP Durel Salts that her say the plan is to repeat abdominal US in October but only conditions noted were fatty liver.     Current Diabetes Medications:   metformin  TID and glipizide  1/2 tablet bid  Home BG Monitoring:  Checking 1 time a day. Average:  190 Fasting in am: 188   Highest - 238  Low fat/carbohydrate diet?  Trying to limit high CHO foods but still somewhat high in fat - hush puppies and hot dogs which could worsen abd pain is related to gall bladder Nicotine Abuse?  No Medication Compliance?  Yes Exercise?  Yes - 30 minutes treadmill and 30 minutes stationary bike Alcohol Abuse?  No   Lab Results  Component Value Date   HGBA1C 8.2 02/28/2014    No results found for this basename: Concepcion Elk    Lab Results  Component Value Date   CHOL 97* 02/28/2014   HDL 31* 02/28/2014   LDLCALC 50 02/28/2014   TRIG 81 02/28/2014   CHOLHDL 2.6 11/29/2013      Assessment: 1.  Diabetes.  Not at goal  2.  Blood Pressure.  At goal previous visit but slightly elevated today 3.  Lipids.  Tg and LDL at goal but low HDL 4.  Abdominal pain - patient to get repeaet [place] in October 5.  Fatty Liver   Recommendations: 1.  Medication recommendations at this time are as follows:    Increase metformin to  bid  Continue glipizide  1/2 tablet bod  Hold niacin - could increase BG 2.  Reviewed HBG goals:  Fasting 80-130 and 1-2 hour post prandial <180.  Patient is instructed to check BG 1 times per day.    3.   Discussed limiting high fat food and nigh CHO  foods - should help with fatty liver and DM 4.  BP goal < 140/85.;  LDL goal of < 100, HDL > 40 and TG < 150.  5.  Return to clinic in 4-6 wks  No orders of the defined types were placed in this encounter.    Time spent counseling patient:  30 minutes  Referring provider:  Burnett Kanaris, PharmD, CPP, CDE

## 2014-03-29 NOTE — Patient Instructions (Addendum)
Increase metformin to 1036m twice a day with food.  Hold niacin for next 2 months to see if affecting blood glucose.  Diabetes and Standards of Medical Care  Diabetes is complicated. You may find that your diabetes team includes a dietitian, nurse, diabetes educator, eye doctor, and more. To help everyone know what is going on and to help you get the care you deserve, the following schedule of care was developed to help keep you on track. Below are the tests, exams, vaccines, medicines, education, and plans you will need.  Blood Glucose Goals Prior to meals = 80 - 130 Within 2 hours of the start of a meal = less than 180  HbA1c test (goal is less than 7.0% - your last value was 8.2%) This test shows how well you have controlled your glucose over the past 2 to 3 months. It is used to see if your diabetes management plan needs to be adjusted.   It is performed at least 2 times a year if you are meeting treatment goals.  It is performed 4 times a year if therapy has changed or if you are not meeting treatment goals.  Blood pressure test  This test is performed at every routine medical visit. The goal is less than 140/90 mmHg for most people, but 130/80 mmHg in some cases. Ask your health care provider about your goal.  Dental exam  Follow up with the dentist regularly.  Eye exam  If you are diagnosed with type 1 diabetes as a child, get an exam upon reaching the age of 132years or older and have had diabetes for 3  To 5 years. Yearly eye exams are recommended after that initial eye exam.  If you are diagnosed with type 1 diabetes as an adult, get an exam within 5 years of diagnosis and then yearly.  If you are diagnosed with type 2 diabetes, get an exam as soon as possible after the diagnosis and then yearly. Foot care exam  Visual foot exams are performed at every routine medical visit. The exams check for cuts, injuries, or other problems with the feet.  A comprehensive foot exam  should be done yearly. This includes visual inspection as well as assessing foot pulses and testing for loss of sensation.  Check your feet nightly for cuts, injuries, or other problems with your feet. Tell your health care provider if anything is not healing. Kidney function test (urine microalbumin)  This test is performed once a year.  Type 1 diabetes: The first test is performed 5 years after diagnosis.  Type 2 diabetes: The first test is performed at the time of diagnosis.  A serum creatinine and estimated glomerular filtration rate (eGFR) test is done once a year to assess the level of chronic kidney disease (CKD), if present. Lipid profile (cholesterol, HDL, LDL, triglycerides)  Performed every 5 years for most people.  The goal for LDL is less than 100 mg/dL. If you are at high risk, the goal is less than 70 mg/dL.  The goal for HDL is 40 mg/dL 50 mg/dL for men and 50 mg/dL 60 mg/dL for women. An HDL cholesterol of 60 mg/dL or higher gives some protection against heart disease.  The goal for triglycerides is less than 150 mg/dL. Influenza vaccine, pneumococcal vaccine, and hepatitis B vaccine  The influenza vaccine is recommended yearly.  The pneumococcal vaccine is generally given once in a lifetime. However, there are some instances when another vaccination is recommended. Check with  your health care provider.  The hepatitis B vaccine is also recommended for adults with diabetes. Diabetes self-management education  Education is recommended at diagnosis and ongoing as needed. Treatment plan  Your treatment plan is reviewed at every medical visit. Document Released: 04/19/2009 Document Revised: 02/22/2013 Document Reviewed: 11/22/2012 [facility] Patient Information 2014 [place].

## 2014-04-10 LAB — HEPATIC FUNCTION PANEL
ALBUMIN: 4.3 g/dL (ref 3.5–5.2)
ALT: 59 U/L — ABNORMAL HIGH (ref 0–53)
AST: 48 U/L — ABNORMAL HIGH (ref 0–37)
Alkaline Phosphatase: 67 U/L (ref 39–117)
BILIRUBIN TOTAL: 0.5 mg/dL (ref 0.2–1.2)
Bilirubin, Direct: 0.1 mg/dL (ref 0.0–0.3)
Indirect Bilirubin: 0.4 mg/dL (ref 0.2–1.2)
Total Protein: 6.9 g/dL (ref 6.0–8.3)

## 2014-04-19 ENCOUNTER — Encounter (INDEPENDENT_AMBULATORY_CARE_PROVIDER_SITE_OTHER): Payer: Self-pay | Admitting: Internal Medicine

## 2014-04-19 ENCOUNTER — Ambulatory Visit (INDEPENDENT_AMBULATORY_CARE_PROVIDER_SITE_OTHER): Payer: BC Managed Care – PPO | Admitting: Internal Medicine

## 2014-04-19 VITALS — BP 106/62 | HR 60 | Temp 97.6°F | Ht 70.0 in | Wt 217.7 lb

## 2014-04-19 DIAGNOSIS — K760 Fatty (change of) liver, not elsewhere classified: Secondary | ICD-10-CM

## 2014-04-19 DIAGNOSIS — R748 Abnormal levels of other serum enzymes: Secondary | ICD-10-CM

## 2014-04-19 NOTE — Patient Instructions (Signed)
OV 3 months. Hepatic function in 6 weeks.

## 2014-04-19 NOTE — Progress Notes (Signed)
Subjective:    Patient ID: [name] [name], male    DOB: 21-Jun-1956, 58 y.o.   MRN: [id number]  HPI Here today for f/u of his elevated liver enzymes. AST and ALT are slightly elevated. In September transaminases were normal.  He is doing okay. He has gained some weight. He has gained 5 pounds since his last visit. He is exercising. He is walking on treadmill and riding stationary bike x 30 minutes a day. BMs are normal. Usually has one BM a day.  No problems with acid reflux. States blood sugars have been running high.   02/2014 HA1C 8.2 12/29/2013 US abdomen:  Increased hepatic echogenicity suggesting steatosis. Focal hepatic abnormalities cannot be excluded due to limitations caused by the degree of sound beam attenuation created by the abnormal echogenicity. Gallbladder sludge vs cholelithiasis. Possible mild gallbladder wall thickening focally but not dif  CBD 3mm  01/22/2014 CT abdomen/pelvis w/o CM:  Liver is diffusely low in attenuation likely secondary to hepatic steatosis. GB unremarkable.   Hepatic Function Panel     Component Value Date/Time   PROT 6.9 04/10/2014 1005   PROT 7.2 02/28/2014 1116   ALBUMIN 4.3 04/10/2014 1005   AST 48* 04/10/2014 1005   ALT 59* 04/10/2014 1005   ALKPHOS 67 04/10/2014 1005   BILITOT 0.5 04/10/2014 1005   BILIDIR 0.1 04/10/2014 1005   IBILI 0.4 04/10/2014 1005    03/08/2014 ALP 80, AST 32, ALT 45    Review of Systems Past Medical History  Diagnosis Date  . OSA on CPAP 2006  . NIDDM (non-insulin dependent diabetes mellitus)     x15 yrs  . Diastolic dysfunction 8/09  . Aortic stenosis 8/09    Bicuspid aortic valve  . CTS (carpal tunnel syndrome)     bilateral   . Hypertension   . Sinusitis   . Nasal congestion   . Hyperlipidemia   . Vitamin D deficiency 06/13/09  . Arthritis 6/10    hand   . Kidney stones   . Paresthesias in left hand 02/19/2004  . Paresthesias in right hand 02/19/2004  . CAD (coronary artery disease) 8/09  . Atrial  fibrillation     post op only    Past Surgical History  Procedure Laterality Date  . Coronary angioplasty with stent placement      to rt coronary atery (Dr. Degert-cardiologist)  . Tissue aortic valve replacement      2012  . Carpal tunnel release      bilateral     Allergies  Allergen Reactions  . Hctz [Hydrochlorothiazide]     Nausea and headache   . Sulfa Antibiotics   . Sulfonamide Derivatives     Current Outpatient Prescriptions on File Prior to Visit  Medication Sig Dispense Refill  . ANDROGEL PUMP 20.25 MG/ACT (1.62%) GEL APPLY 3 PUMPS TOPICALLY EVERY DAY  75 g  3  . aspirin (SB LOW DOSE ASA EC) 81 MG EC tablet Take 81 mg by mouth daily.        [name]. atorvastatin (LIPITOR) 40 MG tablet Take 1 tablet (40 mg total) by mouth daily.  90 tablet  3  . cholecalciferol (VITAMIN D) 1000 UNITS tablet Take 2,000 Units by mouth daily.       . fluticasone (FLONASE) 50 MCG/ACT nasal spray Place 2 sprays into the nose daily as needed.      [name]. glipiZIDE (GLUCOTROL) 5 MG tablet Take 0.5 tablets (2.5 mg total) by mouth 2 (two) times daily before  a meal.  60 tablet  3  . lisinopril (PRINIVIL,ZESTRIL) 40 MG tablet Take 20 mg by mouth daily.      . metFORMIN (GLUCOPHAGE) 1000 MG tablet Take 1 tablet (1,000 mg total) by mouth 2 (two) times daily with a meal.  180 tablet  1  . promethazine (PHENERGAN) 25 MG tablet Take 1 tablet by mouth every 4 (four) hours as needed.      . ranitidine (ZANTAC) 150 MG tablet Take 150 mg by mouth at bedtime.      . temazepam (RESTORIL) 15 MG capsule Take 1 capsule (15 mg total) by mouth at bedtime as needed.  30 capsule  1   No current facility-administered medications on file prior to visit.        Objective:   Physical Exam  Filed Vitals:   04/19/14 1036  BP: 106/62  Pulse: 60  Temp: 97.6 F (36.4 C)  Height: 5\' 10"  (1.778 m)  Weight: 217 lb 11.2 oz (98.748 kg)  Alert and oriented. Skin warm and dry. Oral mucosa is moist.   . Sclera anicteric,  conjunctivae is pink. Thyroid not enlarged. No cervical lymphadenopathy. Lungs clear. Heart regular rate and rhythm.  Abdomen is soft. Bowel sounds are positive. No hepatomegaly. No abdominal masses felt. No tenderness.  No edema to lower extremities.           Assessment & Plan:  Elevated liver enzymes, Fatty liver. Liver enzymes are slightly elevated.  Patient needs to  Diet and exercise.  OV in 3 months.  Hepatic function in 6 weeks.

## 2014-04-25 ENCOUNTER — Telehealth (INDEPENDENT_AMBULATORY_CARE_PROVIDER_SITE_OTHER): Payer: Self-pay | Admitting: *Deleted

## 2014-04-25 DIAGNOSIS — R7401 Elevation of levels of liver transaminase levels: Secondary | ICD-10-CM

## 2014-04-25 DIAGNOSIS — R740 Nonspecific elevation of levels of transaminase and lactic acid dehydrogenase [LDH]: Principal | ICD-10-CM

## 2014-04-25 NOTE — Telephone Encounter (Signed)
.  Per [name],NP-patient is to have lab work drawn in 6 weeks.

## 2014-05-03 ENCOUNTER — Ambulatory Visit (INDEPENDENT_AMBULATORY_CARE_PROVIDER_SITE_OTHER): Payer: BC Managed Care – PPO | Admitting: Pharmacist

## 2014-05-03 ENCOUNTER — Encounter: Payer: Self-pay | Admitting: Pharmacist

## 2014-05-03 VITALS — BP 120/70 | HR 77 | Ht 70.0 in | Wt 217.0 lb

## 2014-05-03 DIAGNOSIS — K760 Fatty (change of) liver, not elsewhere classified: Secondary | ICD-10-CM

## 2014-05-03 DIAGNOSIS — E1165 Type 2 diabetes mellitus with hyperglycemia: Secondary | ICD-10-CM

## 2014-05-03 DIAGNOSIS — Z23 Encounter for immunization: Secondary | ICD-10-CM

## 2014-05-03 DIAGNOSIS — E119 Type 2 diabetes mellitus without complications: Secondary | ICD-10-CM

## 2014-05-03 DIAGNOSIS — E785 Hyperlipidemia, unspecified: Secondary | ICD-10-CM

## 2014-05-03 DIAGNOSIS — IMO0001 Reserved for inherently not codable concepts without codable children: Secondary | ICD-10-CM

## 2014-05-03 MED ORDER — CANAGLIFLOZIN 100 MG PO TABS: 100.0000 mg | ORAL_TABLET | Freq: Every day | ORAL | Status: DC

## 2014-05-03 NOTE — Progress Notes (Signed)
Diabetes Follow-Up Visit Chief Complaint:   Chief Complaint  Patient presents with  . Diabetes     Filed Vitals:   05/03/14 0853  BP: 120/70  Pulse: 77   Filed Weights   05/03/14 0853  Weight: 217 lb (98.431 kg)   Body mass index is 31.14 kg/(m^2).   HPI: patient initially diagnosed with type 2 DM about 10 to 12 years ago.  Most recent A1c was 8.2% (up from 7.5% in January 2015). He was seen about 8 weeks ago.  At that time he was having some abdominal pain and elevated LFTs.  Has since been worked up by GI.  Per NP Amedeo Plenty  Noted to have fatty liver.     Current Diabetes Medications:   metformin 1034m BID and glipizide 563m1/2 tablet bid  He took Januvia 10075mn past but did not notice significant improvement in BG and this was also around the time he developed abdominal pain so Januvia was discontinued.  Home BG Monitoring:  Checking 1 time a day. Average:  179 Fasting in am: 158   Highest - 234  Low fat/carbohydrate diet?  Trying to limit high CHO food.  He has changed diet and is eating less fatty foods Nicotine Abuse?  No Medication Compliance?  Yes Exercise?  Yes - 30 minutes treadmill and 30 minutes stationary bike Alcohol Abuse?  No   Lab Results  Component Value Date   HGBA1C 8.2 02/28/2014    No results found for this basename: MICDerl Barrow Lab Results  Component Value Date   CHOL 97* 02/28/2014   HDL 31* 02/28/2014   LDLCALC 50 02/28/2014   TRIG 81 02/28/2014   CHOLHDL 2.6 11/29/2013      Assessment: 1.  Diabetes.  Not at goal  2.  Blood Pressure.  At goal  3.  Lipids.  Tg and LDL at goal but low HDL 4.  Elevated LFTs / Fatty Liver   Recommendations: 1.  Medication recommendations at this time are as follows:    Increase metformin to 1000m76md  Continue glipizide 5mg 66m tablet bod  Start Invokana 100mg 80mblet qam - gave #20 samples 2.  Reviewed HBG goals:  Fasting 80-130 and 1-2 hour post prandial <180.  Patient is  instructed to check BG 1 times per day.    3.  Continue to limit high CHO and fat foods 4.  BP goal < 140/85.;  LDL goal of < 100, HDL > 40 and TG < 150.  5.  Influenza vaccine given in office today  6.  Return to clinic in 4 to check BMET - 8 weeks to follow up with PCP  Orders Placed This Encounter  Procedures  . BMP8+EGFR    Standing Status: Future     Number of Occurrences:      Standing Expiration Date: 07/02/2014    Time spent counseling patient:  25 minutes  Referring provider:  Cogliano Janene MadeiramD, CPP, CDE

## 2014-05-08 ENCOUNTER — Other Ambulatory Visit: Payer: Self-pay | Admitting: Cardiology

## 2014-05-09 MED ORDER — LISINOPRIL 40 MG PO TABS: 20.0000 mg | ORAL_TABLET | Freq: Every day | ORAL | Status: DC

## 2014-05-23 ENCOUNTER — Encounter (INDEPENDENT_AMBULATORY_CARE_PROVIDER_SITE_OTHER): Payer: Self-pay | Admitting: *Deleted

## 2014-05-23 ENCOUNTER — Other Ambulatory Visit (INDEPENDENT_AMBULATORY_CARE_PROVIDER_SITE_OTHER): Payer: Self-pay | Admitting: *Deleted

## 2014-05-23 DIAGNOSIS — R740 Nonspecific elevation of levels of transaminase and lactic acid dehydrogenase [LDH]: Principal | ICD-10-CM

## 2014-05-23 DIAGNOSIS — R7401 Elevation of levels of liver transaminase levels: Secondary | ICD-10-CM

## 2014-06-04 ENCOUNTER — Other Ambulatory Visit (INDEPENDENT_AMBULATORY_CARE_PROVIDER_SITE_OTHER): Payer: BC Managed Care – PPO

## 2014-06-04 DIAGNOSIS — R7989 Other specified abnormal findings of blood chemistry: Secondary | ICD-10-CM

## 2014-06-04 DIAGNOSIS — E876 Hypokalemia: Secondary | ICD-10-CM

## 2014-06-04 DIAGNOSIS — E119 Type 2 diabetes mellitus without complications: Secondary | ICD-10-CM

## 2014-06-04 DIAGNOSIS — I10 Essential (primary) hypertension: Secondary | ICD-10-CM

## 2014-06-04 DIAGNOSIS — E559 Vitamin D deficiency, unspecified: Secondary | ICD-10-CM

## 2014-06-04 LAB — POCT CBC
Granulocyte percent: 66.4 %G (ref 37–80)
HCT, POC: 42.9 % — AB (ref 43.5–53.7)
Hemoglobin: 14.1 g/dL (ref 14.1–18.1)
LYMPH, POC: 1.7 (ref 0.6–3.4)
MCH: 31.3 pg — AB (ref 27–31.2)
MCHC: 32.8 g/dL (ref 31.8–35.4)
MCV: 95.5 fL (ref 80–97)
MPV: 7.7 fL (ref 0–99.8)
PLATELET COUNT, POC: 203 10*3/uL (ref 142–424)
POC Granulocyte: 4.1 (ref 2–6.9)
POC LYMPH %: 27.6 % (ref 10–50)
RBC: 4.5 M/uL — AB (ref 4.69–6.13)
RDW, POC: 13.8 %
WBC: 6.2 10*3/uL (ref 4.6–10.2)

## 2014-06-04 LAB — POCT GLYCOSYLATED HEMOGLOBIN (HGB A1C): Hemoglobin A1C: 7.6

## 2014-06-04 NOTE — Progress Notes (Signed)
Lab only 

## 2014-06-05 LAB — BMP8+EGFR
BUN/Creatinine Ratio: 17 (ref 9–20)
BUN: 15 mg/dL (ref 6–24)
CO2: 22 mmol/L (ref 18–29)
Calcium: 9.6 mg/dL (ref 8.7–10.2)
Chloride: 100 mmol/L (ref 97–108)
Creatinine, Ser: 0.9 mg/dL (ref 0.76–1.27)
GFR calc Af Amer: 108 mL/min/{1.73_m2} (ref >59)
GFR calc non Af Amer: 94 mL/min/{1.73_m2} (ref >59)
GLUCOSE: 157 mg/dL — AB (ref 65–99)
Potassium: 4.7 mmol/L (ref 3.5–5.2)
Sodium: 139 mmol/L (ref 134–144)

## 2014-06-05 LAB — TESTOSTERONE,FREE AND TOTAL
Testosterone, Free: 8 pg/mL (ref 7.2–24.0)
Testosterone: 325 ng/dL — ABNORMAL LOW (ref 348–1197)

## 2014-06-05 LAB — HEPATIC FUNCTION PANEL
ALT: 59 IU/L — AB (ref 0–44)
AST: 33 IU/L (ref 0–40)
Albumin: 4.7 g/dL (ref 3.5–5.5)
Alkaline Phosphatase: 75 IU/L (ref 39–117)
Bilirubin, Direct: 0.18 mg/dL (ref 0.00–0.40)
Total Bilirubin: 0.5 mg/dL (ref 0.0–1.2)
Total Protein: 7.2 g/dL (ref 6.0–8.5)

## 2014-06-05 LAB — NMR, LIPOPROFILE
Cholesterol: 113 mg/dL (ref 100–199)
HDL CHOLESTEROL BY NMR: 28 mg/dL — AB (ref >39)
HDL Particle Number: 28.1 umol/L — ABNORMAL LOW (ref >=30.5)
LDL Particle Number: 942 nmol/L (ref <1000)
LDL Size: 20 nm (ref >20.5)
LDL-C: 62 mg/dL (ref 0–99)
LP-IR Score: 81 — ABNORMAL HIGH (ref <=45)
SMALL LDL PARTICLE NUMBER: 691 nmol/L — AB (ref <=527)
Triglycerides by NMR: 117 mg/dL (ref 0–149)

## 2014-06-05 LAB — VITAMIN D 25 HYDROXY (VIT D DEFICIENCY, FRACTURES): Vit D, 25-Hydroxy: 36.1 ng/mL (ref 30.0–100.0)

## 2014-06-06 LAB — HEPATIC FUNCTION PANEL
ALBUMIN: 4.5 g/dL (ref 3.5–5.2)
ALT: 61 U/L — ABNORMAL HIGH (ref 0–53)
AST: 40 U/L — ABNORMAL HIGH (ref 0–37)
Alkaline Phosphatase: 69 U/L (ref 39–117)
Bilirubin, Direct: 0.1 mg/dL (ref 0.0–0.3)
Indirect Bilirubin: 0.5 mg/dL (ref 0.2–1.2)
Total Bilirubin: 0.6 mg/dL (ref 0.2–1.2)
Total Protein: 7.2 g/dL (ref 6.0–8.3)

## 2014-06-07 ENCOUNTER — Telehealth: Payer: Self-pay | Admitting: *Deleted

## 2014-06-07 NOTE — Telephone Encounter (Signed)
Lmtcb-review test results.

## 2014-06-07 NOTE — Telephone Encounter (Signed)
-----   Message from Donald W Bilton, MD sent at 06/05/2014  5:44 PM EST ----- LFTs are within normal limits except one liver function tests remain slightly elevated and this is the same as it was one month ago. Cholesterol numbers with advanced lipid testing remained at goal with a total LDL particle number at 942 with a goal of being less than 1000. The LDL C is good at 62. Triglycerides are good at 117. The good cholesterol or the HDL particle number remains low but is slightly higher than it was in the past.--- Continue current treatment and is aggressive therapeutic lifestyle changes as possible. The total testosterone level is improved from 3 months ago but remains slightly decreased at 325 with the normal range being 348 to 1197. The free direct testosterone level is improved and now within normal limits.----For now, continue current treatment and we will monitor this again in the future. The vitamin D level was 36.1. This is within normal limits but I would like to see this level higher. If the patient is taking vitamin D3 1000 daily he should increase this to vitamin D3, 2000 daily. He continues taking 2 of the 1000 daily and at the next purchas buy the 2000 and take 1 daily 

## 2014-06-13 NOTE — Telephone Encounter (Signed)
LFTs are within normal limits except one liver function tests remain slightly elevated and this is the same as it was one month ago.     Cholesterol numbers with advanced lipid testing remained at goal with a total LDL particle number at 942 with a goal of being less than 1000. The LDL C is good at 62. Triglycerides are good at 117. The good cholesterol or the HDL particle number remains low but is slightly higher than it was in the past.--- Continue current treatment and is aggressive therapeutic lifestyle changes as possible.    The total testosterone level is improved from 3 months ago but remains slightly decreased at 325 with the normal range being 348 to 1197. The free direct testosterone level is improved and now within normal limits.----For now, continue current treatment and we will monitor this again in the future.    The vitamin D level was 36.1. This is within normal limits but I would like to see this level higher. If the patient is taking vitamin D3 1000 daily he should increase this to vitamin D3, 2000 daily. He continues taking 2 of the 1000 daily and at the next purchas buy the 2000 and take 1 daily

## 2014-06-13 NOTE — Telephone Encounter (Signed)
-----   Message from Ernestina Pennaonald W Fass, MD sent at 06/05/2014  5:44 PM EST ----- LFTs are within normal limits except one liver function tests remain slightly elevated and this is the same as it was one month ago. Cholesterol numbers with advanced lipid testing remained at goal with a total LDL particle number at 942 with a goal of being less than 1000. The LDL C is good at 62. Triglycerides are good at 117. The good cholesterol or the HDL particle number remains low but is slightly higher than it was in the past.--- Continue current treatment and is aggressive therapeutic lifestyle changes as possible. The total testosterone level is improved from 3 months ago but remains slightly decreased at 325 with the normal range being 348 to 1197. The free direct testosterone level is improved and now within normal limits.----For now, continue current treatment and we will monitor this again in the future. The vitamin D level was 36.1. This is within normal limits but I would like to see this level higher. If the patient is taking vitamin D3 1000 daily he should increase this to vitamin D3, 2000 daily. He continues taking 2 of the 1000 daily and at the next purchas buy the 2000 and take 1 daily

## 2014-06-15 ENCOUNTER — Telehealth (INDEPENDENT_AMBULATORY_CARE_PROVIDER_SITE_OTHER): Payer: Self-pay | Admitting: *Deleted

## 2014-06-15 DIAGNOSIS — R748 Abnormal levels of other serum enzymes: Secondary | ICD-10-CM

## 2014-06-15 NOTE — Telephone Encounter (Signed)
.  Per Terri Setzer,NP patient to have labs in 2 months. 

## 2014-06-26 ENCOUNTER — Ambulatory Visit (INDEPENDENT_AMBULATORY_CARE_PROVIDER_SITE_OTHER): Payer: BC Managed Care – PPO | Admitting: Family Medicine

## 2014-06-26 ENCOUNTER — Encounter: Payer: Self-pay | Admitting: Family Medicine

## 2014-06-26 VITALS — BP 132/80 | HR 76 | Temp 97.7°F | Ht 70.0 in | Wt 215.0 lb

## 2014-06-26 DIAGNOSIS — E349 Endocrine disorder, unspecified: Secondary | ICD-10-CM

## 2014-06-26 DIAGNOSIS — E119 Type 2 diabetes mellitus without complications: Secondary | ICD-10-CM

## 2014-06-26 DIAGNOSIS — N400 Enlarged prostate without lower urinary tract symptoms: Secondary | ICD-10-CM

## 2014-06-26 DIAGNOSIS — E559 Vitamin D deficiency, unspecified: Secondary | ICD-10-CM

## 2014-06-26 DIAGNOSIS — E785 Hyperlipidemia, unspecified: Secondary | ICD-10-CM

## 2014-06-26 DIAGNOSIS — E291 Testicular hypofunction: Secondary | ICD-10-CM

## 2014-06-26 DIAGNOSIS — I10 Essential (primary) hypertension: Secondary | ICD-10-CM

## 2014-06-26 LAB — POCT UA - MICROALBUMIN: MICROALBUMIN (UR) POC: 20 mg/L

## 2014-06-26 MED ORDER — LISINOPRIL 20 MG PO TABS: 20.0000 mg | ORAL_TABLET | Freq: Every day | ORAL | Status: DC

## 2014-06-26 MED ORDER — CANAGLIFLOZIN 100 MG PO TABS: 100.0000 mg | ORAL_TABLET | Freq: Every day | ORAL | Status: DC

## 2014-06-26 NOTE — Patient Instructions (Addendum)
Continue current medications. Continue good therapeutic lifestyle changes which include good diet and exercise. Fall precautions discussed with patient. If an FOBT was given today- please return it to our front desk. If you are over 59 years old - you may need Prevnar 13 or the adult Pneumonia vaccine.  Flu Shots will be available at our office starting mid- September. Please call and schedule a FLU CLINIC APPOINTMENT.    continue to check your feet daily  Continue to monitor your blood pressures and blood sugars regularly  Increase the AndroGel to 4 pumps daily  Continue current diabetic treatment  If the bumps heart irritation on the back of her scalp get worse please return to clinic for us to reevaluate this.  Keep follow-up appointment with cardiology  Please send back home blood sugar readings at your convenience  Take zantac twice a day for awhile

## 2014-06-26 NOTE — Addendum Note (Signed)
Addended by: Magdalene RiverBULLINS, JAMIE H on: 06/26/2014 12:23 PM   Modules accepted: Medications

## 2014-06-26 NOTE — Progress Notes (Signed)
Subjective:    Patient ID: [name] LabradorJames M Kobs, male    DOB: October 16, 1955, 58 y.o.   MRN: [id number]  HPI Pt here for follow up and management of chronic medical problems.  patient has minimal complaints except some concern about the back of his head.  The patient is still encases his energy level is not as good as he would like for her to be. He sees a cardiologist, Dr. Wyline MoodBranch on a yearly basis. His recent lab work will be reviewed with him today and everything was good other than his testosterone levels are still being a little bit on the low side. Creases AndroGel to 4 pumps daily.          Patient Active Problem List   Diagnosis Date Noted  . GERD (gastroesophageal reflux disease) 03/08/2014  . NAFLD (nonalcoholic fatty liver disease) 66/44/034709/09/2013  . Elevated liver enzymes 03/08/2014  . Ascending aortic aneurysm 12/12/2012  . INSOMNIA 09/15/2010  . ATRIAL FIBRILLATION, PAROXYSMAL, HX OF 09/15/2010  . CORONARY ARTERY DISEASE, S/P PTCA 07/09/2009  . AORTIC VALVE DISORDERS 05/27/2009  . OSTEOARTHRITIS, LOCALIZED 05/27/2009  . Diabetes mellitus type 2, uncontrolled, without complications 05/24/2009  . Hyperlipidemia with target LDL less than 70 05/24/2009  . Hypokalemia 05/24/2009  . HYPERTENSION 05/24/2009  . UNSPECIFIED SLEEP APNEA 05/24/2009   Outpatient Encounter Prescriptions as of 06/26/2014  Medication Sig  . ANDROGEL PUMP 20.25 MG/ACT (1.62%) GEL APPLY 3 PUMPS TOPICALLY EVERY DAY  . aspirin (SB LOW DOSE ASA EC) 81 MG EC tablet Take 81 mg by mouth daily.    [name]. atorvastatin (LIPITOR) 40 MG tablet Take 1 tablet (40 mg total) by mouth daily.  . canagliflozin (INVOKANA) 100 MG TABS tablet Take 1 tablet (100 mg total) by mouth daily.  . cholecalciferol (VITAMIN D) 1000 UNITS tablet Take 2,000 Units by mouth daily.   . fluticasone (FLONASE) 50 MCG/ACT nasal spray Place 2 sprays into the nose daily as needed.  [name]. glipiZIDE (GLUCOTROL) 5 MG tablet Take 0.5 tablets (2.5 mg total) by  mouth 2 (two) times daily before a meal.  . lisinopril (PRINIVIL,ZESTRIL) 40 MG tablet Take 0.5 tablets (20 mg total) by mouth daily.  . metFORMIN (GLUCOPHAGE) 1000 MG tablet Take 1 tablet (1,000 mg total) by mouth 2 (two) times daily with a meal.  . ranitidine (ZANTAC) 150 MG tablet Take 150 mg by mouth at bedtime.  . temazepam (RESTORIL) 15 MG capsule Take 1 capsule (15 mg total) by mouth at bedtime as needed.  . promethazine (PHENERGAN) 25 MG tablet Take 1 tablet by mouth every 4 (four) hours as needed.    Review of Systems  Constitutional: Negative.   HENT: Negative.   Eyes: Negative.   Respiratory: Negative.   Cardiovascular: Negative.   Gastrointestinal: Negative.   Endocrine: Negative.   Genitourinary: Negative.   Musculoskeletal: Negative.   Skin: Negative.        Bumps on back of head  Allergic/Immunologic: Negative.   Neurological: Negative.   Hematological: Negative.   Psychiatric/Behavioral: Negative.        Objective:   Physical Exam  Constitutional: He is oriented to person, place, and time. He appears well-developed and well-nourished.  HENT:  Head: Normocephalic and atraumatic.  Right Ear: External ear normal.  Left Ear: External ear normal.  Mouth/Throat: Oropharynx is clear and moist. No oropharyngeal exudate.  Nasal congestion bilaterally  Eyes: Conjunctivae and EOM are normal. Pupils are equal, round, and reactive to light. Right eye exhibits no discharge. Left  eye exhibits no discharge. No scleral icterus.  Neck: Normal range of motion. Neck supple. No thyromegaly present.  No anterior cervical nodes or thyromegaly or carotid bruits.  Cardiovascular: Normal rate, regular rhythm and intact distal pulses.  Exam reveals friction rub.   No murmur heard. The heart had a regular rate and rhythm at 96/m  Pulmonary/Chest: Effort normal and breath sounds normal. No respiratory distress. He has no wheezes. He has no rales. He exhibits no tenderness.  There were  no axillary nodes.  Abdominal: Soft. Bowel sounds are normal. He exhibits no mass. There is no tenderness. There is no rebound and no guarding.  There were no inguinal nodes. There was no organomegaly or abdominal tenderness  Musculoskeletal: Normal range of motion. He exhibits no edema or tenderness.  Lymphadenopathy:    He has no cervical adenopathy.  Neurological: He is alert and oriented to person, place, and time. He has normal reflexes. No cranial nerve deficit.  Skin: Skin is warm and dry. No rash noted. No erythema. No pallor.  Psychiatric: He has a normal mood and affect. His behavior is normal. Judgment and thought content normal.  Nursing note and vitals reviewed.  BP 132/80 mmHg  Pulse 76  Temp(Src) 97.7 F (36.5 C) (Oral)  Ht 5\' 10"  (1.778 m)  Wt 215 lb (97.523 kg)  BMI 30.85 kg/m2        Assessment & Plan:  1. Type 2 diabetes mellitus without complication - POCT UA - Microalbumin - Microalbumin, urine  2. Hyperlipidemia with target LDL less than 70  3. Testosterone deficiency  4. Vitamin D deficiency  5. Essential hypertension - POCT UA - Microalbumin  6. BPH (benign prostatic hyperplasia) Meds ordered this encounter  Medications  . canagliflozin (INVOKANA) 100 MG TABS tablet    Sig: Take 1 tablet (100 mg total) by mouth daily.    Dispense:  90 tablet    Refill:  3  . lisinopril (PRINIVIL,ZESTRIL) 20 MG tablet    Sig: Take 1 tablet (20 mg total) by mouth daily.    Dispense:  90 tablet    Refill:  3   Patient Instructions  Continue current medications. Continue good therapeutic lifestyle changes which include good diet and exercise. Fall precautions discussed with patient. If an FOBT was given today- please return it to our front desk. If you are over 263 years old - you may need Prevnar 13 or the adult Pneumonia vaccine.  Flu Shots will be available at our office starting mid- September. Please call and schedule a FLU CLINIC APPOINTMENT.     continue to check your feet daily  Continue to monitor your blood pressures and blood sugars regularly  Increase the AndroGel to 4 pumps daily  Continue current diabetic treatment  If the bumps heart irritation on the back of her scalp get worse please return to clinic for us to reevaluate this.  Keep follow-up appointment with cardiology  Please send back home blood sugar readings at your convenience     Nyra Capeson W. Arey MD

## 2014-06-27 ENCOUNTER — Telehealth: Payer: Self-pay | Admitting: *Deleted

## 2014-06-27 LAB — MICROALBUMIN, URINE: Microalbumin, Urine: 86.1 ug/mL — ABNORMAL HIGH (ref 0.0–17.0)

## 2014-06-27 NOTE — Telephone Encounter (Signed)
Aware results

## 2014-07-08 ENCOUNTER — Other Ambulatory Visit: Payer: Self-pay | Admitting: Family Medicine

## 2014-07-29 ENCOUNTER — Other Ambulatory Visit: Payer: Self-pay | Admitting: Family Medicine

## 2014-09-18 ENCOUNTER — Other Ambulatory Visit: Payer: Self-pay

## 2014-09-18 MED ORDER — TESTOSTERONE 20.25 MG/ACT (1.62%) TD GEL: TRANSDERMAL | Status: DC

## 2014-09-18 NOTE — Telephone Encounter (Signed)
Last seen 06/26/14 DWM  If approved [facility]  [phone]

## 2014-10-11 ENCOUNTER — Other Ambulatory Visit: Payer: Self-pay | Admitting: Cardiology

## 2014-10-11 ENCOUNTER — Encounter: Payer: Self-pay | Admitting: Cardiology

## 2014-10-11 ENCOUNTER — Ambulatory Visit (INDEPENDENT_AMBULATORY_CARE_PROVIDER_SITE_OTHER): Payer: BLUE CROSS/BLUE SHIELD | Admitting: Cardiology

## 2014-10-11 VITALS — BP 150/80 | HR 76 | Ht 70.0 in | Wt 219.8 lb

## 2014-10-11 DIAGNOSIS — I2510 Atherosclerotic heart disease of native coronary artery without angina pectoris: Secondary | ICD-10-CM | POA: Diagnosis not present

## 2014-10-11 DIAGNOSIS — I10 Essential (primary) hypertension: Secondary | ICD-10-CM

## 2014-10-11 DIAGNOSIS — E785 Hyperlipidemia, unspecified: Secondary | ICD-10-CM

## 2014-10-11 DIAGNOSIS — I719 Aortic aneurysm of unspecified site, without rupture: Secondary | ICD-10-CM | POA: Diagnosis not present

## 2014-10-11 DIAGNOSIS — G4733 Obstructive sleep apnea (adult) (pediatric): Secondary | ICD-10-CM | POA: Diagnosis not present

## 2014-10-11 DIAGNOSIS — I350 Nonrheumatic aortic (valve) stenosis: Secondary | ICD-10-CM | POA: Diagnosis not present

## 2014-10-11 LAB — BASIC METABOLIC PANEL
BUN: 11 mg/dL (ref 6–23)
CALCIUM: 9.4 mg/dL (ref 8.4–10.5)
CHLORIDE: 103 meq/L (ref 96–112)
CO2: 24 mEq/L (ref 19–32)
Creat: 0.69 mg/dL (ref 0.50–1.35)
Glucose, Bld: 172 mg/dL — ABNORMAL HIGH (ref 70–99)
Potassium: 4.1 mEq/L (ref 3.5–5.3)
Sodium: 141 mEq/L (ref 135–145)

## 2014-10-11 MED ORDER — ATENOLOL 25 MG PO TABS: 25.0000 mg | ORAL_TABLET | Freq: Every day | ORAL | Status: DC

## 2014-10-11 NOTE — Progress Notes (Signed)
Clinical Summary Mr. Delair is a 59 y.o.male seen today for follow up of the following medical problems.   1. AVR - history of bicuspid AV, s/p bioprosthetic AVR in 2012 - denies any chest pain, SOB, DOE, presyncope, or syncope since last visit - no LE edema, no orthopnea, no PND  2. Ascending aortic aneurysm - mild aortic root dilatation at 4.1 to 4.2 cm by last MRI 10/2013, likely related to hx of bicuspid AV and potential coexisting aortopathy. Reports some claustrophobia during MRI.  - denies any chest pain  3. HTN - checks bp occasionally, typically around 130-150s/80s - on higher dose of lisinopril had symptomatic low bp's, back down to  daily by pcp.  - compliant with meds  4. OSA - not compliant with CPAP  5. Hyperlipidemia - compliant with lipitor Last panel 05/2014 TG 117 HDL 28 LDL 62  6. DM2 - followed by pcp Past Medical History  Diagnosis Date  . OSA on CPAP 2006  . NIDDM (non-insulin dependent diabetes mellitus)     x15 yrs  . Diastolic dysfunction 8/09  . Aortic stenosis 8/09    Bicuspid aortic valve  . CTS (carpal tunnel syndrome)     bilateral   . Hypertension   . Sinusitis   . Nasal congestion   . Hyperlipidemia   . Vitamin D deficiency 06/13/09  . Arthritis 6/10    hand   . Kidney stones   . Paresthesias in left hand 02/19/2004  . Paresthesias in right hand 02/19/2004  . CAD (coronary artery disease) 8/09  . Atrial fibrillation     post op only  . Elevated LFTs   . Fatty liver disease, nonalcoholic      Allergies  Allergen Reactions  . Hctz [Hydrochlorothiazide]     Nausea and headache   . Sulfa Antibiotics   . Sulfonamide Derivatives      Current Outpatient Prescriptions  Medication Sig Dispense Refill  . aspirin (SB LOW DOSE ASA EC) 81 MG EC tablet Take 81 mg by mouth daily.      [name] atorvastatin (LIPITOR) 40 MG tablet TAKE 1 TABLET DAILY 90 tablet 1  . canagliflozin (INVOKANA) 100 MG TABS tablet Take 1 tablet (100 mg  total) by mouth daily. 90 tablet 3  . cholecalciferol (VITAMIN D) 1000 UNITS tablet Take 2,000 Units by mouth daily.     . fluticasone (FLONASE) 50 MCG/ACT nasal spray Place 2 sprays into the nose daily as needed.    [name] glipiZIDE (GLUCOTROL) 5 MG tablet TAKE 1 TABLET DAILY 90 tablet 0  . lisinopril (PRINIVIL,ZESTRIL) 20 MG tablet Take 1 tablet (20 mg total) by mouth daily. 90 tablet 3  . metFORMIN (GLUCOPHAGE) 1000 MG tablet Take 1 tablet (1,000 mg total) by mouth 2 (two) times daily with a meal. 180 tablet 1  . promethazine (PHENERGAN) 25 MG tablet Take 1 tablet by mouth every 4 (four) hours as needed.    . ranitidine (ZANTAC) 150 MG tablet Take 150 mg by mouth at bedtime.    . temazepam (RESTORIL) 15 MG capsule Take 1 capsule (15 mg total) by mouth at bedtime as needed. 30 capsule 1  . Testosterone (ANDROGEL PUMP) 20.25 MG/ACT (1.62%) GEL APPLY 3 PUMPS TOPICALLY EVERY DAY 75 g 3   No current facility-administered medications for this visit.     Past Surgical History  Procedure Laterality Date  . Coronary angioplasty with stent placement      to rt coronary atery (Dr.  Degert-cardiologist)  . Tissue aortic valve replacement      2012  . Carpal tunnel release      bilateral      Allergies  Allergen Reactions  . Hctz [Hydrochlorothiazide]     Nausea and headache   . Sulfa Antibiotics   . Sulfonamide Derivatives       Family History  Problem Relation Age of Onset  . Diabetes      Family History   . Hearing loss Mother   . Alzheimer's disease Mother   . Hearing loss Father   . Diabetes Father      Social History Mr. Gipe reports that he quit smoking about 12 years ago. His smoking use included Cigarettes. He has a 37.5 pack-year smoking history. He has never used smokeless tobacco. Mr. Issa reports that he does not drink alcohol.   Review of Systems CONSTITUTIONAL: No weight loss, fever, chills, weakness or fatigue.  HEENT: Eyes: No visual loss, blurred vision,  double vision or yellow sclerae.No hearing loss, sneezing, congestion, runny nose or sore throat.  SKIN: No rash or itching.  CARDIOVASCULAR: per HPI RESPIRATORY: No shortness of breath, cough or sputum.  GASTROINTESTINAL: No anorexia, nausea, vomiting or diarrhea. No abdominal pain or blood.  GENITOURINARY: No burning on urination, no polyuria NEUROLOGICAL: No headache, dizziness, syncope, paralysis, ataxia, numbness or tingling in the extremities. No change in bowel or bladder control.  MUSCULOSKELETAL: No muscle, back pain, joint pain or stiffness.  LYMPHATICS: No enlarged nodes. No history of splenectomy.  PSYCHIATRIC: No history of depression or anxiety.  ENDOCRINOLOGIC: No reports of sweating, cold or heat intolerance. No polyuria or polydipsia.  [name]   Physical Examination p 76 bp 150/80 Wt 219 lbs BMI 32 Gen: resting comfortably, no acute distress HEENT: no scleral icterus, pupils equal round and reactive, no palptable cervical adenopathy,  CV: RRR, no m/r/g, no JVD, no carotid bruits Resp: Clear to auscultation bilaterally GI: abdomen is soft, non-tender, non-distended, normal bowel sounds, no hepatosplenomegaly MSK: extremities are warm, no edema.  Skin: warm, no rash Neuro:  no focal deficits Psych: appropriate affect   Diagnostic Studies 09/2012 MRI/MRA Chest Stable 4 cm ascending thoracic aneurysm  08/2010 Cath HEMODYNAMIC DATA: Right atrial pressures 7/5 with a mean of 4 mmHg,  right ventricular pressure is 24 with an EDP of 7 mmHg. Pulmonary  artery pressure is 20/7 with a mean of 13 mmHg. Pulmonary capillary  wedge pressure is 10/7 with a mean of 6 mmHg. Aortic pressure is 118/67  with a mean of 88 mmHg. Left ventricular pressure is 161 with an EDP of  12 mmHg. There is no significant mitral valve gradient. The aortic  valve mean gradient is 31 mmHg with calculated valve area of 0.8  centimeter squared. Thermodilution cardiac output is 4.4 L per minute  with  an index of 2, Fick cardiac outputs 4.7 L per minute with an index  of 2.2.  ANGIOGRAPHIC DATA: The left ventricular angiography was performed in a  RAO view. This demonstrates normal left ventricular size and  contractility with normal systolic function. Ejection fraction is  estimated at 65%. The aortic valve is calcified with eccentric opening  and restrictive mobility.  The left coronary artery arises and distributes normally. The left main  coronary has 20% narrowing in the ostium and in the distal left main.  The left anterior descending artery is a tortuous vessel which has less  than 10% irregularities.  Left circumflex coronary artery is rise to  first obtuse marginal vessel  which has a 40-50% stenosis proximally.  The right coronary artery is a dominant vessel, it has 20% disease in  the distal vessel. The stent in the midvessel was widely patent.  FINAL INTERPRETATION:  1. Nonobstructive atherosclerotic coronary artery disease.  2. Normal left ventricular function.  3. Severe aortic stenosis.  4. Normal right heart pressures.  PLAN: Proceed with aortic valve replacement.  10/2013 MRA Chest EXAM: MRA CHEST WITH OR WITHOUT CONTRAST  TECHNIQUE: Angiographic images of the chest were obtained using MRA technique without and with intravenous contrast.  CONTRAST: 20mL MULTIHANCE GADOBENATE DIMEGLUMINE 529 MG/ML IV SOLN  COMPARISON: CT UROGRAM dated 05/08/2011; MR MRA CHEST W/ OR W/O CM dated 10/07/2012; CT ANGIO CHEST dated 09/08/2010  FINDINGS: There is stable aneurysmal dilatation of the aortic root that does not involve the sinuses of Valsalva. No aortic dissection is present. Artifact is noted from a prosthetic aortic valve. Diameter at the level of the sinuses of Valsalva is approximately 3.2 cm. Maximal caliber of the ascending thoracic aorta is 4.1- 4.2 cm. The proximal arch measures 3.8 cm. The distal arch measures 2.6 cm. The descending  thoracic aorta measures 2.6 cm. Proximal great vessels show stable and normal patency without anatomical variant.  The heart size is normal. No pleural or pericardial fluid is identified. No masses or lymphadenopathy are seen. Visualized spine shows no abnormalities.  IMPRESSION: Stable aneurysmal dilatation of the ascending thoracic aorta measuring 4.1- 4.2 cm in greatest diameter. No dissection is identified.   Assessment and Plan    1. Aortic valve replacement - hx of bicuspid AV, s/p bioprosthetic AVR in 2012 - denies any current symptoms, continue to follow clinically  2. Ascending aortic root aneurysm - will repeat imaging. He reports significant claustrophobia during MRI, will order CTA instead.   3. HTN - elevated at times based on home numbers, due to DM2 goal is <130/80 - did not tolerate high dose of lisionpril. Will try low dose atenolol in setting of HTN and aneurysm.   4. OSA - encouraged increased compliance with CPAP  5. Hyperlipidemia - at goal, continue current statin   F/u 1 year   Antoine PocheJonathan F. Justine Cossin, M.D.,

## 2014-10-11 NOTE — Patient Instructions (Signed)
Your physician wants you to follow-up in: 1 year with Dr. Lurena JoinerBranch You will receive a reminder letter in the mail two months in advance. If you don't receive a letter, please call our office to schedule the follow-up appointment.  Your physician has recommended you make the following change in your medication:   START ATENOLOL 25 MG DAILY  CONTINUE ALL OTHER MEDICATION AS DIRECTED  Non-Cardiac CT Angiography (CTA), is a special type of CT scan that uses a computer to produce multi-dimensional views of major blood vessels throughout the body. In CT angiography, a contrast material is injected through an IV to help visualize the blood vessels  Your physician recommends that you return for lab work before your CTA  [facility]!!

## 2014-10-18 ENCOUNTER — Telehealth: Payer: Self-pay | Admitting: Cardiology

## 2014-10-18 NOTE — Telephone Encounter (Signed)
[employer] QMVH#[id number]#[id number] exp 11-16-14

## 2014-10-18 NOTE — Telephone Encounter (Signed)
Checking percert for CT angio Chest set for 10/24/14

## 2014-10-22 ENCOUNTER — Ambulatory Visit (INDEPENDENT_AMBULATORY_CARE_PROVIDER_SITE_OTHER): Payer: BC Managed Care – PPO | Admitting: Internal Medicine

## 2014-10-24 ENCOUNTER — Encounter (HOSPITAL_COMMUNITY): Payer: Self-pay

## 2014-10-24 ENCOUNTER — Ambulatory Visit (HOSPITAL_COMMUNITY)
Admission: RE | Admit: 2014-10-24 | Discharge: 2014-10-24 | Disposition: A | Discharge status: Home / Self Care | Payer: BLUE CROSS/BLUE SHIELD | Source: Ambulatory Visit | Attending: Cardiology | Admitting: Cardiology

## 2014-10-24 ENCOUNTER — Telehealth: Payer: Self-pay | Admitting: *Deleted

## 2014-10-24 DIAGNOSIS — Z954 Presence of other heart-valve replacement: Secondary | ICD-10-CM | POA: Insufficient documentation

## 2014-10-24 DIAGNOSIS — I719 Aortic aneurysm of unspecified site, without rupture: Secondary | ICD-10-CM | POA: Diagnosis present

## 2014-10-24 MED ORDER — IOHEXOL 350 MG/ML SOLN
100.0000 mL | Freq: Once | INTRAVENOUS | Status: AC | PRN
  Administered 2014-10-24: 100 mL via INTRAVENOUS

## 2014-10-24 NOTE — Telephone Encounter (Signed)
Pt made aware, forwarded to Dr. Mcvicar 

## 2014-10-24 NOTE — Telephone Encounter (Signed)
-----   Message from Antoine PocheJonathan F Branch, MD sent at 10/24/2014  9:21 AM EDT ----- CT shows aneurysm is stable in size. We will continue to monitor with tests once a year  Dominga FerryJ Branch MD

## 2014-11-07 ENCOUNTER — Ambulatory Visit (INDEPENDENT_AMBULATORY_CARE_PROVIDER_SITE_OTHER): Payer: BLUE CROSS/BLUE SHIELD | Admitting: Family Medicine

## 2014-11-07 ENCOUNTER — Encounter: Payer: Self-pay | Admitting: Family Medicine

## 2014-11-07 VITALS — BP 140/71 | HR 61 | Temp 97.2°F | Ht 70.0 in | Wt 216.0 lb

## 2014-11-07 DIAGNOSIS — E559 Vitamin D deficiency, unspecified: Secondary | ICD-10-CM

## 2014-11-07 DIAGNOSIS — E785 Hyperlipidemia, unspecified: Secondary | ICD-10-CM

## 2014-11-07 DIAGNOSIS — K760 Fatty (change of) liver, not elsewhere classified: Secondary | ICD-10-CM

## 2014-11-07 DIAGNOSIS — E291 Testicular hypofunction: Secondary | ICD-10-CM

## 2014-11-07 DIAGNOSIS — N400 Enlarged prostate without lower urinary tract symptoms: Secondary | ICD-10-CM | POA: Diagnosis not present

## 2014-11-07 DIAGNOSIS — E119 Type 2 diabetes mellitus without complications: Secondary | ICD-10-CM | POA: Diagnosis not present

## 2014-11-07 DIAGNOSIS — I10 Essential (primary) hypertension: Secondary | ICD-10-CM | POA: Diagnosis not present

## 2014-11-07 DIAGNOSIS — I712 Thoracic aortic aneurysm, without rupture: Secondary | ICD-10-CM | POA: Diagnosis not present

## 2014-11-07 DIAGNOSIS — I7121 Aneurysm of the ascending aorta, without rupture: Secondary | ICD-10-CM

## 2014-11-07 DIAGNOSIS — E349 Endocrine disorder, unspecified: Secondary | ICD-10-CM

## 2014-11-07 LAB — POCT GLYCOSYLATED HEMOGLOBIN (HGB A1C): Hemoglobin A1C: 7.8

## 2014-11-07 LAB — POCT CBC
Granulocyte percent: 66.7 %G (ref 37–80)
HEMATOCRIT: 45.2 % (ref 43.5–53.7)
Hemoglobin: 14.2 g/dL (ref 14.1–18.1)
Lymph, poc: 1.9 (ref 0.6–3.4)
MCH: 30.3 pg (ref 27–31.2)
MCHC: 31.5 g/dL — AB (ref 31.8–35.4)
MCV: 96.1 fL (ref 80–97)
MPV: 7.3 fL (ref 0–99.8)
POC Granulocyte: 4.9 (ref 2–6.9)
POC LYMPH %: 25.2 % (ref 10–50)
Platelet Count, POC: 206 10*3/uL (ref 142–424)
RBC: 4.7 M/uL (ref 4.69–6.13)
RDW, POC: 13 %
WBC: 7.4 10*3/uL (ref 4.6–10.2)

## 2014-11-07 MED ORDER — TEMAZEPAM 15 MG PO CAPS: 15.0000 mg | ORAL_CAPSULE | Freq: Every evening | ORAL | Status: DC | PRN

## 2014-11-07 MED ORDER — GLIPIZIDE 5 MG PO TABS: 5.0000 mg | ORAL_TABLET | Freq: Every day | ORAL | Status: DC

## 2014-11-07 MED ORDER — PROMETHAZINE HCL 25 MG PO TABS: 25.0000 mg | ORAL_TABLET | ORAL | Status: DC | PRN

## 2014-11-07 NOTE — Patient Instructions (Addendum)
Medicare Annual Wellness Visit  [facility] and the medical providers at [facility] strive to bring you the best medical care.  In doing so we not only want to address your current medical conditions and concerns but also to detect new conditions early and prevent illness, disease and health-related problems.    Medicare offers a yearly Wellness Visit which allows our clinical staff to assess your need for preventative services including immunizations, lifestyle education, counseling to decrease risk of preventable diseases and screening for fall risk and other medical concerns.    This visit is provided free of charge (no copay) for all Medicare recipients. The clinical pharmacists at [facility] have begun to conduct these Wellness Visits which will also include a thorough review of all your medications.    As you primary medical provider recommend that you make an appointment for your Annual Wellness Visit if you have not done so already this year.  You may set up this appointment before you leave today or you may call back [phone] and schedule an appointment.  Please make sure when you call that you mention that you are scheduling your Annual Wellness Visit with the clinical pharmacist so that the appointment may be made for the proper length of time.     Continue current medications. Continue good therapeutic lifestyle changes which include good diet and exercise. Fall precautions discussed with patient. If an FOBT was given today- please return it to our front desk. If you are over 60 years old - you may need Prevnar 13 or the adult Pneumonia vaccine.  Flu Shots are still available at our office. If you still haven't had one please call to set up a nurse visit to get one.   After your visit with us today you will receive a survey in the mail or online from [employer] regarding your care with us. Please take a moment to  fill this out. Your feedback is very important to us as you can help us better understand your patient needs as well as improve your experience and satisfaction. WE CARE ABOUT YOU!!!    the patient should continue to monitor his blood sugars regularly and his blood pressures.  He should stay as active as possible.  He should continue to drink plenty of fluids.  He should follow-up with the cardiologist regularly.  He should check his feet regularly.  Try to take the Zantac if possible at least a half an hour before meals and reduce fluid intake before going to bed at night time

## 2014-11-07 NOTE — Progress Notes (Signed)
Subjective:    Patient ID: [name] [name], male    DOB: Dec 29, 1955, 59 y.o.   MRN: [id number]  HPI Pt here for follow up and management of chronic medical problems which includes diabetes, hypertension, and hyperlipidemia. He is taking medications regularly. The patient today has no specific complaints. His fasting blood sugars are running in the 140-180 range. He has readings that were done in April without taking Invega,. These readings were not significantly different than those with the Enbrel,. His blood pressure readings are good. These readings will be scanned into the record. The patient denies chest pain or shortness of breath. He does have some occasional indigestion and is taking Zantac 150 twice daily before breakfast and supper and not always a half an hour before breakfast and supper. He denies blood in the stool or black tarry bowel movements and has to get up at night time a couple times to pass his water. He sees a cardiologist regularly and this is in the spring time and they did a CT scan to evaluate his aneurysm and everything was stable with that. He'll see the cardiologist again next spring. The patient has a lot of responsibilities at home looking after his father his wife's father and his wife. He stays quite busy. He seems to be in good spirits.      Patient Active Problem List   Diagnosis Date Noted  . GERD (gastroesophageal reflux disease) 03/08/2014  . NAFLD (nonalcoholic fatty liver disease) 03/08/2014  . Elevated liver enzymes 03/08/2014  . Ascending aortic aneurysm 12/12/2012  . INSOMNIA 09/15/2010  . ATRIAL FIBRILLATION, PAROXYSMAL, HX OF 09/15/2010  . CAD (coronary artery disease) 07/09/2009  . AORTIC VALVE DISORDERS 05/27/2009  . OSTEOARTHRITIS, LOCALIZED 05/27/2009  . Diabetes mellitus type 2, uncontrolled, without complications 78/58/8502  . Hyperlipidemia with target LDL less than 70 05/24/2009  . Hypokalemia 05/24/2009  . HYPERTENSION 05/24/2009  .  UNSPECIFIED SLEEP APNEA 05/24/2009   Outpatient Encounter Prescriptions as of 11/07/2014  Medication Sig  . aspirin (SB LOW DOSE ASA EC) 81 MG EC tablet Take 81 mg by mouth daily.    [name] atenolol (TENORMIN) 25 MG tablet Take 1 tablet (25 mg total) by mouth daily.  [name] atorvastatin (LIPITOR) 40 MG tablet TAKE 1 TABLET DAILY  . cholecalciferol (VITAMIN D) 1000 UNITS tablet Take 2,000 Units by mouth daily.   . fluticasone (FLONASE) 50 MCG/ACT nasal spray Place 2 sprays into the nose daily as needed.  [name] glipiZIDE (GLUCOTROL) 5 MG tablet TAKE 1 TABLET DAILY  . lisinopril (PRINIVIL,ZESTRIL) 20 MG tablet Take 1 tablet (20 mg total) by mouth daily.  . metFORMIN (GLUCOPHAGE) 1000 MG tablet Take 1 tablet (1,000 mg total) by mouth 2 (two) times daily with a meal.  . promethazine (PHENERGAN) 25 MG tablet Take 1 tablet by mouth every 4 (four) hours as needed.  . ranitidine (ZANTAC) 150 MG tablet Take 150 mg by mouth at bedtime.  . temazepam (RESTORIL) 15 MG capsule Take 1 capsule (15 mg total) by mouth at bedtime as needed.  . Testosterone (ANDROGEL PUMP) 20.25 MG/ACT (1.62%) GEL APPLY 3 PUMPS TOPICALLY EVERY DAY  . [DISCONTINUED] canagliflozin (INVOKANA) 100 MG TABS tablet Take 1 tablet (100 mg total) by mouth daily.   No facility-administered encounter medications on file as of 11/07/2014.     Review of Systems  Constitutional: Negative.   HENT: Negative.   Eyes: Negative.   Respiratory: Negative.   Cardiovascular: Negative.   Gastrointestinal: Negative.   Endocrine:  Negative.   Genitourinary: Negative.   Musculoskeletal: Negative.   Skin: Negative.   Allergic/Immunologic: Negative.   Neurological: Negative.   Hematological: Negative.   Psychiatric/Behavioral: Negative.        Objective:   Physical Exam  Constitutional: He is oriented to person, place, and time. He appears well-developed and well-nourished. No distress.  HENT:  Head: Normocephalic and atraumatic.  Right Ear: External ear  normal.  Left Ear: External ear normal.  Mouth/Throat: Oropharynx is clear and moist. No oropharyngeal exudate.  Some nasal congestion bilaterally  Eyes: Conjunctivae and EOM are normal. Pupils are equal, round, and reactive to light. Right eye exhibits no discharge. Left eye exhibits no discharge. No scleral icterus.  Neck: Normal range of motion. Neck supple. No thyromegaly present.  Bilateral carotid bruits most likely secondary to systolic ejection murmur  Cardiovascular: Normal rate, regular rhythm and intact distal pulses.  Exam reveals no gallop and no friction rub.   Murmur heard. 72/m. There is a grade 2/6 systolic ejection murmur.  Pulmonary/Chest: Effort normal and breath sounds normal. No respiratory distress. He has no wheezes. He has no rales. He exhibits no tenderness.  No anterior cervical nodes  Abdominal: Soft. Bowel sounds are normal. He exhibits no mass. There is no tenderness. There is no rebound and no guarding.  No epigastric tenderness, no inguinal nodes  Musculoskeletal: Normal range of motion. He exhibits no edema or tenderness.  Lymphadenopathy:    He has no cervical adenopathy.  Neurological: He is alert and oriented to person, place, and time. He has normal reflexes. No cranial nerve deficit.  Skin: Skin is warm and dry. No rash noted. No erythema. No pallor.  Psychiatric: He has a normal mood and affect. His behavior is normal. Judgment and thought content normal.  Nursing note and vitals reviewed.  BP 140/71 mmHg  Pulse 61  Temp(Src) 97.2 F (36.2 C) (Oral)  Ht $R'5\' 10"'WU$  (1.778 m)  Wt 216 lb (97.977 kg)  BMI 30.99 kg/m2        Assessment & Plan:  1. Type 2 diabetes mellitus without complication -The blood sugars were reviewed at home and are running slightly high. The patient indicates his blood sugars on the  invokanna were no lower than they were off of it. He is no longer taking this medication because of the expense. We will wait and see what the  A1c is before we make any further changes. - POCT CBC - POCT glycosylated hemoglobin (Hb A1C)  2. Hyperlipidemia with target LDL less than 70 -The patient will continue with his current cholesterol treatment pending results of lab work - POCT CBC - NMR, lipoprofile  3. Testosterone deficiency -Is currently not complaining of any fatigue or decreased libido. - POCT CBC - Testosterone,Free and Total  4. Vitamin D deficiency -The vitamin D treatment will be adjusted pending results of lab work - POCT CBC - Vit D  25 hydroxy (rtn osteoporosis monitoring)  5. Essential hypertension -The blood pressures at home are running good and less than 140/80. No changes will be made in this treatment today. - POCT CBC - BMP8+EGFR - Hepatic function panel  6. BPH (benign prostatic hyperplasia) -The patient continues to have some nocturia and he was instructed to drink less fluids late in the evening. - POCT CBC  7. Ascending aortic aneurysm -This is followed up by the cardiologist regularly with CT scans and is unchanged according to the patient from his last CT scan.  8. Fatty liver -  This is an ongoing problem and we will continue to monitor his liver function tests.  Meds ordered this encounter  Medications  . temazepam (RESTORIL) 15 MG capsule    Sig: Take 1 capsule (15 mg total) by mouth at bedtime as needed.    Dispense:  30 capsule    Refill:  2  . glipiZIDE (GLUCOTROL) 5 MG tablet    Sig: Take 1 tablet (5 mg total) by mouth daily.    Dispense:  90 tablet    Refill:  3  . promethazine (PHENERGAN) 25 MG tablet    Sig: Take 1 tablet (25 mg total) by mouth every 4 (four) hours as needed.    Dispense:  30 tablet    Refill:  2   Patient Instructions                       Medicare Annual Wellness Visit  [facility] and the medical providers at [facility] strive to bring you the best medical care.  In doing so we not only want to address your current medical  conditions and concerns but also to detect new conditions early and prevent illness, disease and health-related problems.    Medicare offers a yearly Wellness Visit which allows our clinical staff to assess your need for preventative services including immunizations, lifestyle education, counseling to decrease risk of preventable diseases and screening for fall risk and other medical concerns.    This visit is provided free of charge (no copay) for all Medicare recipients. The clinical pharmacists at [facility] have begun to conduct these Wellness Visits which will also include a thorough review of all your medications.    As you primary medical provider recommend that you make an appointment for your Annual Wellness Visit if you have not done so already this year.  You may set up this appointment before you leave today or you may call back [phone]) and schedule an appointment.  Please make sure when you call that you mention that you are scheduling your Annual Wellness Visit with the clinical pharmacist so that the appointment may be made for the proper length of time.     Continue current medications. Continue good therapeutic lifestyle changes which include good diet and exercise. Fall precautions discussed with patient. If an FOBT was given today- please return it to our front desk. If you are over 67 years old - you may need Prevnar 69 or the adult Pneumonia vaccine.  Flu Shots are still available at our office. If you still haven't had one please call to set up a nurse visit to get one.   After your visit with [place] today you will receive a survey in the mail or online from [employer] regarding your care with [place]. Please take a moment to fill this out. Your feedback is very important to [place] as you can help [place] better understand your patient needs as well as improve your experience and satisfaction. WE CARE ABOUT YOU!!!    the patient should continue to monitor his blood  sugars regularly and his blood pressures.  He should stay as active as possible.  He should continue to drink plenty of fluids.  He should follow-up with the cardiologist regularly.  He should check his feet regularly.  Try to take the Zantac if possible at least a half an hour before meals and reduce fluid intake before going to bed at night time   Timmothy Sours  Jennette Bill MD

## 2014-11-08 LAB — BMP8+EGFR
BUN/Creatinine Ratio: 16 (ref 9–20)
BUN: 13 mg/dL (ref 6–24)
CALCIUM: 9.9 mg/dL (ref 8.7–10.2)
CO2: 22 mmol/L (ref 18–29)
CREATININE: 0.81 mg/dL (ref 0.76–1.27)
Chloride: 99 mmol/L (ref 97–108)
GFR calc Af Amer: 112 mL/min/{1.73_m2} (ref >59)
GFR, EST NON AFRICAN AMERICAN: 97 mL/min/{1.73_m2} (ref >59)
Glucose: 154 mg/dL — ABNORMAL HIGH (ref 65–99)
Potassium: 4.2 mmol/L (ref 3.5–5.2)
SODIUM: 140 mmol/L (ref 134–144)

## 2014-11-08 LAB — HEPATIC FUNCTION PANEL
ALBUMIN: 4.8 g/dL (ref 3.5–5.5)
ALK PHOS: 64 IU/L (ref 39–117)
ALT: 63 IU/L — ABNORMAL HIGH (ref 0–44)
AST: 44 IU/L — ABNORMAL HIGH (ref 0–40)
BILIRUBIN TOTAL: 0.8 mg/dL (ref 0.0–1.2)
BILIRUBIN, DIRECT: 0.24 mg/dL (ref 0.00–0.40)
Total Protein: 7.3 g/dL (ref 6.0–8.5)

## 2014-11-08 LAB — NMR, LIPOPROFILE
Cholesterol: 97 mg/dL — ABNORMAL LOW (ref 100–199)
HDL Cholesterol by NMR: 31 mg/dL — ABNORMAL LOW (ref >39)
HDL PARTICLE NUMBER: 26.8 umol/L — AB (ref >=30.5)
LDL Particle Number: 787 nmol/L (ref <1000)
LDL SIZE: 20 nm (ref >20.5)
LDL-C: 44 mg/dL (ref 0–99)
LP-IR SCORE: 75 — AB (ref <=45)
Small LDL Particle Number: 537 nmol/L — ABNORMAL HIGH (ref <=527)
Triglycerides by NMR: 109 mg/dL (ref 0–149)

## 2014-11-08 LAB — VITAMIN D 25 HYDROXY (VIT D DEFICIENCY, FRACTURES): Vit D, 25-Hydroxy: 32.5 ng/mL (ref 30.0–100.0)

## 2014-11-08 LAB — TESTOSTERONE,FREE AND TOTAL
TESTOSTERONE FREE: 5.3 pg/mL — AB (ref 7.2–24.0)
Testosterone: 308 ng/dL — ABNORMAL LOW (ref 348–1197)

## 2014-11-12 ENCOUNTER — Other Ambulatory Visit: Payer: BLUE CROSS/BLUE SHIELD

## 2014-11-12 DIAGNOSIS — Z1212 Encounter for screening for malignant neoplasm of rectum: Secondary | ICD-10-CM

## 2014-11-12 NOTE — Progress Notes (Signed)
Lab only 

## 2014-11-14 LAB — FECAL OCCULT BLOOD, IMMUNOCHEMICAL: FECAL OCCULT BLD: NEGATIVE

## 2014-11-19 ENCOUNTER — Ambulatory Visit (INDEPENDENT_AMBULATORY_CARE_PROVIDER_SITE_OTHER): Payer: BLUE CROSS/BLUE SHIELD | Admitting: Family Medicine

## 2014-11-19 ENCOUNTER — Encounter: Payer: Self-pay | Admitting: Family Medicine

## 2014-11-19 VITALS — BP 115/72 | HR 64 | Temp 98.4°F | Ht 70.0 in | Wt 216.0 lb

## 2014-11-19 DIAGNOSIS — T148 Other injury of unspecified body region: Secondary | ICD-10-CM | POA: Diagnosis not present

## 2014-11-19 DIAGNOSIS — W57XXXA Bitten or stung by nonvenomous insect and other nonvenomous arthropods, initial encounter: Secondary | ICD-10-CM | POA: Diagnosis not present

## 2014-11-19 LAB — POCT CBC
GRANULOCYTE PERCENT: 64.6 % (ref 37–80)
HCT, POC: 44.2 % (ref 43.5–53.7)
Hemoglobin: 14 g/dL — AB (ref 14.1–18.1)
LYMPH, POC: 1.7 (ref 0.6–3.4)
MCH, POC: 30.5 pg (ref 27–31.2)
MCHC: 31.7 g/dL — AB (ref 31.8–35.4)
MCV: 96.2 fL (ref 80–97)
MPV: 7.2 fL (ref 0–99.8)
POC GRANULOCYTE: 4.3 (ref 2–6.9)
POC LYMPH %: 25.6 % (ref 10–50)
Platelet Count, POC: 216 10*3/uL (ref 142–424)
RBC: 4.6 M/uL — AB (ref 4.69–6.13)
RDW, POC: 12.9 %
WBC: 6.6 10*3/uL (ref 4.6–10.2)

## 2014-11-19 MED ORDER — DOXYCYCLINE HYCLATE 100 MG PO TABS: 100.0000 mg | ORAL_TABLET | Freq: Two times a day (BID) | ORAL | Status: DC

## 2014-11-19 NOTE — Progress Notes (Signed)
Subjective:    Patient ID: [name] [name], male    DOB: 1955/10/26, 59 y.o.   MRN: [id number]  HPI Patient here today for multiple recent tick bite. He states that one area has been red and irritated. The ticks were actually removed a week ago. He brings him in for review and these are definitely deer ticks. He's had no rash or fever and each tick that was removed left a local reaction and not an erythema migrans reaction. He has had tick bites of both ankles and the buttock area.     Patient Active Problem List   Diagnosis Date Noted  . GERD (gastroesophageal reflux disease) 03/08/2014  . NAFLD (nonalcoholic fatty liver disease) 40/98/119109/09/2013  . Elevated liver enzymes 03/08/2014  . Ascending aortic aneurysm 12/12/2012  . INSOMNIA 09/15/2010  . ATRIAL FIBRILLATION, PAROXYSMAL, HX OF 09/15/2010  . CAD (coronary artery disease) 07/09/2009  . AORTIC VALVE DISORDERS 05/27/2009  . OSTEOARTHRITIS, LOCALIZED 05/27/2009  . Diabetes mellitus type 2, uncontrolled, without complications 05/24/2009  . Hyperlipidemia with target LDL less than 70 05/24/2009  . Hypokalemia 05/24/2009  . HYPERTENSION 05/24/2009  . UNSPECIFIED SLEEP APNEA 05/24/2009   Outpatient Encounter Prescriptions as of 11/19/2014  Medication Sig  . aspirin (SB LOW DOSE ASA EC) 81 MG EC tablet Take 81 mg by mouth daily.    [name]. atenolol (TENORMIN) 25 MG tablet Take 1 tablet (25 mg total) by mouth daily.  [name]. atorvastatin (LIPITOR) 40 MG tablet TAKE 1 TABLET DAILY  . cholecalciferol (VITAMIN D) 1000 UNITS tablet Take 2,000 Units by mouth daily.   . fluticasone (FLONASE) 50 MCG/ACT nasal spray Place 2 sprays into the nose daily as needed.  [name]. glipiZIDE (GLUCOTROL) 5 MG tablet Take 1 tablet (5 mg total) by mouth daily.  [name]. lisinopril (PRINIVIL,ZESTRIL) 20 MG tablet Take 1 tablet (20 mg total) by mouth daily.  . metFORMIN (GLUCOPHAGE) 1000 MG tablet Take 1 tablet (1,000 mg total) by mouth 2 (two) times daily with a meal.  . promethazine  (PHENERGAN) 25 MG tablet Take 1 tablet (25 mg total) by mouth every 4 (four) hours as needed.  . ranitidine (ZANTAC) 150 MG tablet Take 150 mg by mouth at bedtime.  . temazepam (RESTORIL) 15 MG capsule Take 1 capsule (15 mg total) by mouth at bedtime as needed.  . Testosterone (ANDROGEL PUMP) 20.25 MG/ACT (1.62%) GEL APPLY 3 PUMPS TOPICALLY EVERY DAY   No facility-administered encounter medications on file as of 11/19/2014.     Review of Systems  Constitutional: Negative.   HENT: Negative.   Eyes: Negative.   Respiratory: Negative.   Cardiovascular: Negative.   Gastrointestinal: Negative.   Endocrine: Negative.   Genitourinary: Negative.   Musculoskeletal: Negative.   Skin: Negative.        Multiple tick bites  Allergic/Immunologic: Negative.   Neurological: Negative.   Hematological: Negative.   Psychiatric/Behavioral: Negative.        Objective:   Physical Exam  Constitutional: He is oriented to person, place, and time. He appears well-developed and well-nourished.  Neurological: He is alert and oriented to person, place, and time.  Skin: Skin is warm and dry. No rash noted. There is erythema. No pallor.  Multiple spots on both ankles and right buttock at bite sites no erythema migrans  Psychiatric: He has a normal mood and affect. His behavior is normal. Judgment and thought content normal.  Nursing note and vitals reviewed.   BP 115/72 mmHg  Pulse 64  Temp(Src) 98.4  F (36.9 C) (Oral)  Ht  (1.778 m)  Wt 216 lb (97.977 kg)  BMI 30.99 kg/m2       Assessment & Plan:  1. Tick bite -The ticks that were removed were definitely deer ticks and the patient was given information regarding tick bites and treatment was provided - Lyme Ab/Western Blot Reflex - Rocky mtn spotted fvr abs pnl(IgG+IgM) - POCT CBC  Meds ordered this encounter  Medications  . DISCONTD: doxycycline (VIBRA-TABS) 100 MG tablet    Sig: Take 1 tablet (100 mg total) by mouth 2 (two) times  daily.    Dispense:  28 tablet    Refill:  0  . doxycycline (VIBRA-TABS) 100 MG tablet    Sig: Take 1 tablet (100 mg total) by mouth 2 (two) times daily.    Dispense:  60 tablet    Refill:  1   Patient Instructions  Tick Bite Information Ticks are insects that attach themselves to the skin and draw blood for food. There are various types of ticks. Common types include wood ticks and deer ticks. Most ticks live in shrubs and grassy areas. Ticks can climb onto your body when you make contact with leaves or grass where the tick is waiting. The most common places on the body for ticks to attach themselves are the scalp, neck, armpits, waist, and groin. Most tick bites are harmless, but sometimes ticks carry germs that cause diseases. These germs can be spread to a person during the tick's feeding process. The chance of a disease spreading through a tick bite depends on:   The type of tick.  Time of year.   How long the tick is attached.   Geographic location.  HOW CAN YOU PREVENT TICK BITES? Take these steps to help prevent tick bites when you are outdoors:  Wear protective clothing. Long sleeves and long pants are best.   Wear white clothes so you can see ticks more easily.  Tuck your pant legs into your socks.   If walking on a trail, stay in the middle of the trail to avoid brushing against bushes.  Avoid walking through areas with long grass.  Put insect repellent on all exposed skin and along boot tops, pant legs, and sleeve cuffs.   Check clothing, hair, and skin repeatedly and before going inside.   Brush off any ticks that are not attached.  Take a shower or bath as soon as possible after being outdoors.  WHAT IS THE PROPER WAY TO REMOVE A TICK? Ticks should be removed as soon as possible to help prevent diseases caused by tick bites. 1. If latex gloves are available, put them on before trying to remove a tick.  2. Using fine-point tweezers, grasp the tick as  close to the skin as possible. You may also use curved forceps or a tick removal tool. Grasp the tick as close to its head as possible. Avoid grasping the tick on its body. 3. Pull gently with steady upward pressure until the tick lets go. Do not twist the tick or jerk it suddenly. This may break off the tick's head or mouth parts. 4. Do not squeeze or crush the tick's body. This could force disease-carrying fluids from the tick into your body.  5. After the tick is removed, wash the bite area and your hands with soap and water or other disinfectant such as alcohol. 6. Apply a small amount of antiseptic cream or ointment to the bite site.  7. Wash  and disinfect any instruments that were used.  Do not try to remove a tick by applying a hot match, petroleum jelly, or fingernail polish to the tick. These methods do not work and may increase the chances of disease being spread from the tick bite.  WHEN SHOULD YOU SEEK MEDICAL CARE? Contact your health care provider if you are unable to remove a tick from your skin or if a part of the tick breaks off and is stuck in the skin.  After a tick bite, you need to be aware of signs and symptoms that could be related to diseases spread by ticks. Contact your health care provider if you develop any of the following in the days or weeks after the tick bite:  Unexplained fever.  Rash. A circular rash that appears days or weeks after the tick bite may indicate the possibility of Lyme disease. The rash may resemble a target with a bull's-eye and may occur at a different part of your body than the tick bite.  Redness and swelling in the area of the tick bite.   Tender, swollen lymph glands.   Diarrhea.   Weight loss.   Cough.   Fatigue.   Muscle, joint, or bone pain.   Abdominal pain.   Headache.   Lethargy or a change in your level of consciousness.  Difficulty walking or moving your legs.   Numbness in the legs.    Paralysis.  Shortness of breath.   Confusion.   Repeated vomiting.  Document Released: 06/19/2000 Document Revised: 04/12/2013 Document Reviewed: 11/30/2012 [facility] Patient Information 2015 [place], [place]. This information is not intended to replace advice given to you by your health care provider. Make sure you discuss any questions you have with your health care provider.   Use deep [place] [employer] off before going in a wooded area Wear 2 pairs of socks as discussed with the second pair having the pain and leg tucked into the outer pair of socks Take her clothes off immediately when you come in the house Check body carefully for any ticks Please call back if any questions   Nyra Capeson W. Burdick MD

## 2014-11-19 NOTE — Patient Instructions (Addendum)
Tick Bite Information Ticks are insects that attach themselves to the skin and draw blood for food. There are various types of ticks. Common types include wood ticks and deer ticks. Most ticks live in shrubs and grassy areas. Ticks can climb onto your body when you make contact with leaves or grass where the tick is waiting. The most common places on the body for ticks to attach themselves are the scalp, neck, armpits, waist, and groin. Most tick bites are harmless, but sometimes ticks carry germs that cause diseases. These germs can be spread to a person during the tick's feeding process. The chance of a disease spreading through a tick bite depends on:   The type of tick.  Time of year.   How long the tick is attached.   Geographic location.  HOW CAN YOU PREVENT TICK BITES? Take these steps to help prevent tick bites when you are outdoors:  Wear protective clothing. Long sleeves and long pants are best.   Wear white clothes so you can see ticks more easily.  Tuck your pant legs into your socks.   If walking on a trail, stay in the middle of the trail to avoid brushing against bushes.  Avoid walking through areas with long grass.  Put insect repellent on all exposed skin and along boot tops, pant legs, and sleeve cuffs.   Check clothing, hair, and skin repeatedly and before going inside.   Brush off any ticks that are not attached.  Take a shower or bath as soon as possible after being outdoors.  WHAT IS THE PROPER WAY TO REMOVE A TICK? Ticks should be removed as soon as possible to help prevent diseases caused by tick bites. 1. If latex gloves are available, put them on before trying to remove a tick.  2. Using fine-point tweezers, grasp the tick as close to the skin as possible. You may also use curved forceps or a tick removal tool. Grasp the tick as close to its head as possible. Avoid grasping the tick on its body. 3. Pull gently with steady upward pressure until  the tick lets go. Do not twist the tick or jerk it suddenly. This may break off the tick's head or mouth parts. 4. Do not squeeze or crush the tick's body. This could force disease-carrying fluids from the tick into your body.  5. After the tick is removed, wash the bite area and your hands with soap and water or other disinfectant such as alcohol. 6. Apply a small amount of antiseptic cream or ointment to the bite site.  7. Wash and disinfect any instruments that were used.  Do not try to remove a tick by applying a hot match, petroleum jelly, or fingernail polish to the tick. These methods do not work and may increase the chances of disease being spread from the tick bite.  WHEN SHOULD YOU SEEK MEDICAL CARE? Contact your health care provider if you are unable to remove a tick from your skin or if a part of the tick breaks off and is stuck in the skin.  After a tick bite, you need to be aware of signs and symptoms that could be related to diseases spread by ticks. Contact your health care provider if you develop any of the following in the days or weeks after the tick bite:  Unexplained fever.  Rash. A circular rash that appears days or weeks after the tick bite may indicate the possibility of Lyme disease. The rash may resemble   a target with a bull's-eye and may occur at a different part of your body than the tick bite.  Redness and swelling in the area of the tick bite.   Tender, swollen lymph glands.   Diarrhea.   Weight loss.   Cough.   Fatigue.   Muscle, joint, or bone pain.   Abdominal pain.   Headache.   Lethargy or a change in your level of consciousness.  Difficulty walking or moving your legs.   Numbness in the legs.   Paralysis.  Shortness of breath.   Confusion.   Repeated vomiting.  Document Released: 06/19/2000 Document Revised: 04/12/2013 Document Reviewed: 11/30/2012 [facility] Patient Information 2015 [place], [place]. This information is  not intended to replace advice given to you by your health care provider. Make sure you discuss any questions you have with your health care provider.   Use deep [place] [employer] off before going in a wooded area Wear 2 pairs of socks as discussed with the second pair having the pain and leg tucked into the outer pair of socks Take her clothes off immediately when you come in the house Check body carefully for any ticks Please call back if any questions

## 2014-11-21 LAB — LYME AB/WESTERN BLOT REFLEX: LYME DISEASE AB, QUANT, IGM: <0.8 index (ref 0.00–0.79)

## 2014-11-21 LAB — ROCKY MTN SPOTTED FVR ABS PNL(IGG+IGM)
RMSF IgG: NEGATIVE
RMSF IgM: 0.27 index (ref 0.00–0.89)

## 2014-12-10 ENCOUNTER — Encounter: Payer: Self-pay | Admitting: Pharmacist

## 2014-12-10 ENCOUNTER — Ambulatory Visit (INDEPENDENT_AMBULATORY_CARE_PROVIDER_SITE_OTHER): Payer: BLUE CROSS/BLUE SHIELD | Admitting: Pharmacist

## 2014-12-10 VITALS — BP 142/80 | HR 71 | Ht 70.0 in | Wt 214.0 lb

## 2014-12-10 DIAGNOSIS — IMO0001 Reserved for inherently not codable concepts without codable children: Secondary | ICD-10-CM

## 2014-12-10 DIAGNOSIS — E785 Hyperlipidemia, unspecified: Secondary | ICD-10-CM | POA: Diagnosis not present

## 2014-12-10 DIAGNOSIS — E1165 Type 2 diabetes mellitus with hyperglycemia: Secondary | ICD-10-CM | POA: Diagnosis not present

## 2014-12-10 DIAGNOSIS — K760 Fatty (change of) liver, not elsewhere classified: Secondary | ICD-10-CM | POA: Diagnosis not present

## 2014-12-10 DIAGNOSIS — I10 Essential (primary) hypertension: Secondary | ICD-10-CM | POA: Diagnosis not present

## 2014-12-10 DIAGNOSIS — E1169 Type 2 diabetes mellitus with other specified complication: Secondary | ICD-10-CM | POA: Diagnosis not present

## 2014-12-10 LAB — GLUCOSE, POCT (MANUAL RESULT ENTRY): POC GLUCOSE: 233 mg/dL — AB (ref 70–99)

## 2014-12-10 MED ORDER — NATEGLINIDE 120 MG PO TABS: 120.0000 mg | ORAL_TABLET | Freq: Three times a day (TID) | ORAL | Status: DC

## 2014-12-10 NOTE — Progress Notes (Signed)
Diabetes Follow-Up Visit Chief Complaint:   Chief Complaint  Patient presents with  . Diabetes     Filed Vitals:   12/10/14 1439  BP: 142/80  Pulse: 71   Filed Weights   12/10/14 1439  Weight: 214 lb (97.07 kg)   Body mass index is 30.71 kg/(m^2).   HPI: patient initially diagnosed with type 2 DM about 10 to 12 years ago.  Most recent A1c was 7.8% (up from 7.5%). I last saw patient about 10 months ago.   Current Diabetes Medications:   metformin 1000mg  BID and glipizide 5mg  1/2 tablet bid  He took Januvia 100mg  in past but did not notice significant improvement in BG and this was also around the time he developed abdominal pain so Januvia was discontinued.  He also has elevated lipase (very slightly and elevated LFTs)  Has since been worked up by GI.  Per NP Durel Saltserr Setzer noted to have fatty liver.      Home BG Monitoring:  Checking 1 time a day - only checking in am / fasting Average:  150 Fasting in am: 130   Highest - 180  Low fat/carbohydrate diet?  Trying to limit high CHO food but has not been as cautious recently Nicotine Abuse?  No Medication Compliance?  Yes Exercise?  Yes - 30 minutes treadmill and 30 minutes stationary bike Alcohol Abuse?  No   Lab Results  Component Value Date   HGBA1C 7.8 11/07/2014    [facility] results found for: [facility]  Lab Results  Component Value Date   CHOL 97* 11/07/2014   HDL 31* 11/07/2014   LDLCALC 50 02/28/2014   TRIG 109 11/07/2014   CHOLHDL 2.6 11/29/2013    RBG = 223 mg/dL today in office (about 2 hours post prandial)  Assessment: 1.  Diabetes.  Not at goal ; PP BG was elevated in office today 2.  Blood Pressure.  SBP elevated today but DBP at goal 3.  Lipids. - last showed LDL and Tg at goals, HDL low;  Patient is on statin 4.  Elevated LFTs / Fatty Liver - stable   Recommendations: 1.  Medication recommendations at this time are as follows:    Continue metformin to 1000mg  bid  Stop glipizide  Start nateglinide  120mg  tid prior to each meal. 2.  Reviewed HBG goals:  Fasting 80-130 and 1-2 hour post prandial <180.  Patient is instructed to check BG 1 times per day and to start varying times he checks - sometime fasting and sometimes after a meal 3.  Continue to limit high CHO and high fat foods - reviewed serving sizes and CHO content of various foods. 4.  BP goal < 140/85.;  LDL goal of < 100, HDL > 40 and TG < 150.  5.  Return to clinic in 4 -6 weeks to follow up DM.  Orders Placed This Encounter  Procedures  . POCT glucose (manual entry)    Time spent counseling patient:  35 minutes  Referring provider:  Burnett KanarisMoore  Jerome Otter, PharmD, CPP, CDE

## 2014-12-10 NOTE — Patient Instructions (Signed)
Stop glipizide Start nateglinide $RemoveBeforeDEI'120mg'[id number]$  take 1 tablet prior to each meal  Diabetes and Standards of Medical Care   Diabetes is complicated. You may find that your diabetes team includes a dietitian, nurse, diabetes educator, eye doctor, and more. To help everyone know what is going on and to help you get the care you deserve, the following schedule of care was developed to help keep you on track. Below are the tests, exams, vaccines, medicines, education, and plans you will need.  Blood Glucose Goals Prior to meals = 80 - 130 Within 2 hours of the start of a meal = less than 180  HbA1c test (goal is less than 7.0% - your last value was 7.8%) This test shows how well you have controlled your glucose over the past 2 to 3 months. It is used to see if your diabetes management plan needs to be adjusted.   It is performed at least 2 times a year if you are meeting treatment goals.  It is performed 4 times a year if therapy has changed or if you are not meeting treatment goals.  Blood pressure test  This test is performed at every routine medical visit. The goal is less than 140/90 mmHg for most people, but 130/80 mmHg in some cases. Ask your health care provider about your goal.  Dental exam  Follow up with the dentist regularly.  Eye exam  If you are diagnosed with type 1 diabetes as a child, get an exam upon reaching the age of 8 years or older and have had diabetes for 3 to 5 years. Yearly eye exams are recommended after that initial eye exam.  If you are diagnosed with type 1 diabetes as an adult, get an exam within 5 years of diagnosis and then yearly.  If you are diagnosed with type 2 diabetes, get an exam as soon as possible after the diagnosis and then yearly.  Foot care exam  Visual foot exams are performed at every routine medical visit. The exams check for cuts, injuries, or other problems with the feet.  A comprehensive foot exam should be done yearly. This includes visual  inspection as well as assessing foot pulses and testing for loss of sensation.  Check your feet nightly for cuts, injuries, or other problems with your feet. Tell your health care provider if anything is not healing.  Kidney function test (urine microalbumin)  This test is performed once a year.  Type 1 diabetes: The first test is performed 5 years after diagnosis.  Type 2 diabetes: The first test is performed at the time of diagnosis.  A serum creatinine and estimated glomerular filtration rate (eGFR) test is done once a year to assess the level of chronic kidney disease (CKD), if present.  Lipid profile (cholesterol, HDL, LDL, triglycerides)  Performed every 5 years for most people.  The goal for LDL is less than 100 mg/dL. If you are at high risk, the goal is less than 70 mg/dL.  The goal for HDL is 40 mg/dL to 50 mg/dL for men and 50 mg/dL to 60 mg/dL for women. An HDL cholesterol of 60 mg/dL or higher gives some protection against heart disease.  The goal for triglycerides is less than 150 mg/dL.  Influenza vaccine, pneumococcal vaccine, and hepatitis B vaccine  The influenza vaccine is recommended yearly.  The pneumococcal vaccine is generally given once in a lifetime. However, there are some instances when another vaccination is recommended. Check with your health care provider.  The hepatitis B vaccine is also recommended for adults with diabetes.  Diabetes self-management education  Education is recommended at diagnosis and ongoing as needed.  Treatment plan  Your treatment plan is reviewed at every medical visit.  Document Released: 04/19/2009 Document Revised: 02/22/2013 Document Reviewed: 11/22/2012 [facility] Patient Information 2014 [place].  Hypoglycemia Hypoglycemia occurs when the glucose in your blood is too low. Glucose is a type of sugar that is your body's main energy source. Hormones, such as insulin and glucagon, control the level of glucose in  the blood. Insulin lowers blood glucose and glucagon increases blood glucose. Having too much insulin in your blood stream, or not eating enough food containing sugar, can result in hypoglycemia. Hypoglycemia can happen to people with or without diabetes. It can develop quickly and can be a medical emergency.  CAUSES   Missing or delaying meals.  Not eating enough carbohydrates at meals.  Taking too much diabetes medicine.  Not timing your oral diabetes medicine or insulin doses with meals, snacks, and exercise.  Nausea and vomiting.  Certain medicines.  Severe illnesses, such as hepatitis, kidney disorders, and certain eating disorders.  Increased activity or exercise without eating something extra or adjusting medicines.  Drinking too much alcohol.  A nerve disorder that affects body functions like your heart rate, blood pressure, and digestion (autonomic neuropathy).  A condition where the stomach muscles do not function properly (gastroparesis). Therefore, medicines and food may not absorb properly.  Rarely, a tumor of the pancreas can produce too much insulin. SYMPTOMS   Hunger.  Sweating (diaphoresis).  Change in body temperature.  Shakiness.  Headache.  Anxiety.  Lightheadedness.  Irritability.  Difficulty concentrating.  Dry mouth.  Tingling or numbness in the hands or feet.  Restless sleep or sleep disturbances.  Altered speech and coordination.  Change in mental status.  Seizures or prolonged convulsions.  Combativeness.  Drowsiness (lethargic).  Weakness.  Increased heart rate or palpitations.  Confusion.  Pale, gray skin color.  Blurred or double vision.  Fainting. DIAGNOSIS  A physical exam and medical history will be performed. Your caregiver may make a diagnosis based on your symptoms. Blood tests and other lab tests may be performed to confirm a diagnosis. Once the diagnosis is made, your caregiver will see if your signs and  symptoms go away once your blood glucose is raised.  TREATMENT  Usually, you can easily treat your hypoglycemia when you notice symptoms.  Check your blood glucose. If it is less than 70 mg/dl, take one of the following:   3-4 glucose tablets.    cup juice.    cup regular soda.   1 cup skim milk.   -1 tube of glucose gel.   5-6 hard candies.   Avoid high-fat drinks or food that may delay a rise in blood glucose levels.  Do not take more than the recommended amount of sugary foods, drinks, gel, or tablets. Doing so will cause your blood glucose to go too high.   Wait 10-15 minutes and recheck your blood glucose. If it is still less than 70 mg/dl or below your target range, repeat treatment.   Eat a snack if it is more than 1 hour until your next meal.  There may be a time when your blood glucose may go so low that you are unable to treat yourself at home when you start to notice symptoms. You may need someone to help you. You may even faint or be unable to swallow.  If you cannot treat yourself, someone will need to bring you to the hospital.  [place]  If you have diabetes, follow your diabetes management plan by:  Taking your medicines as directed.  Following your exercise plan.  Following your meal plan. Do not skip meals. Eat on time.  Testing your blood glucose regularly. Check your blood glucose before and after exercise. If you exercise longer or different than usual, be sure to check blood glucose more frequently.  Wearing your medical alert jewelry that says you have diabetes.  Identify the cause of your hypoglycemia. Then, develop ways to prevent the recurrence of hypoglycemia.  Do not take a hot bath or shower right after an insulin shot.  Always carry treatment with you. Glucose tablets are the easiest to carry.  If you are going to drink alcohol, drink it only with meals.  Tell friends or family members ways to keep you safe during  a seizure. This may include removing hard or sharp objects from the area or turning you on your side.  Maintain a healthy weight. SEEK MEDICAL CARE IF:   You are having problems keeping your blood glucose in your target range.  You are having frequent episodes of hypoglycemia.  You feel you might be having side effects from your medicines.  You are not sure why your blood glucose is dropping so low.  You notice a change in vision or a new problem with your vision. SEEK IMMEDIATE MEDICAL CARE IF:   Confusion develops.  A change in mental status occurs.  The inability to swallow develops.  Fainting occurs. Document Released: 06/22/2005 Document Revised: 06/27/2013 Document Reviewed: 10/19/2011 [facility] Patient Information 2015 [place], [place]. This information is not intended to replace advice given to you by your health care provider. Make sure you discuss any questions you have with your health care provider.

## 2015-01-06 ENCOUNTER — Other Ambulatory Visit: Payer: Self-pay | Admitting: Family Medicine

## 2015-01-17 ENCOUNTER — Ambulatory Visit (INDEPENDENT_AMBULATORY_CARE_PROVIDER_SITE_OTHER): Payer: BLUE CROSS/BLUE SHIELD | Admitting: Pharmacist

## 2015-01-17 ENCOUNTER — Telehealth: Payer: Self-pay | Admitting: Pharmacist

## 2015-01-17 ENCOUNTER — Encounter: Payer: Self-pay | Admitting: Pharmacist

## 2015-01-17 VITALS — BP 144/82 | HR 67 | Ht 70.0 in | Wt 215.0 lb

## 2015-01-17 DIAGNOSIS — E1169 Type 2 diabetes mellitus with other specified complication: Secondary | ICD-10-CM | POA: Diagnosis not present

## 2015-01-17 DIAGNOSIS — K5900 Constipation, unspecified: Secondary | ICD-10-CM

## 2015-01-17 DIAGNOSIS — IMO0001 Reserved for inherently not codable concepts without codable children: Secondary | ICD-10-CM

## 2015-01-17 DIAGNOSIS — I10 Essential (primary) hypertension: Secondary | ICD-10-CM

## 2015-01-17 DIAGNOSIS — E785 Hyperlipidemia, unspecified: Secondary | ICD-10-CM

## 2015-01-17 DIAGNOSIS — E1165 Type 2 diabetes mellitus with hyperglycemia: Secondary | ICD-10-CM | POA: Diagnosis not present

## 2015-01-17 MED ORDER — GLIPIZIDE 5 MG PO TABS
5.0000 mg | ORAL_TABLET | Freq: Two times a day (BID) | ORAL | Status: DC
Start: 2015-01-17 — End: 2015-08-04

## 2015-01-17 NOTE — Telephone Encounter (Signed)
Patient wanted to notify me that his current glipizide rx still has 3 refills but the directions are 1 tablet qd and we just increased to 1 tablet bid.  He will use up what he has currently and new rx will come in a few days.

## 2015-01-17 NOTE — Progress Notes (Signed)
Diabetes Follow-Up Visit Chief Complaint:   Chief Complaint  Patient presents with  . Diabetes     Filed Vitals:   01/17/15 0939  BP: 144/82  Pulse: 67   Filed Weights   01/17/15 0939  Weight: 215 lb (97.523 kg)   Body mass index is 30.85 kg/(m^2).   HPI: patient initially diagnosed with type 2 DM about 10 to 12 years ago.  Most recent A1c was 7.8% (up from 7.5%). I last saw patient about 6 weeks.  Current Diabetes Medications:   metformin 1000mg  BID and nateglinide 120mg  1 tablet prior to each meal.  He took Januvia 100mg  in past but did not notice significant improvement in BG and this was also around the time he developed abdominal pain so Januvia was discontinued.  He also has elevated lipase (very slightly and elevated LFTs)  Has since been worked up by GI.  Per NP Durel Saltserr Setzer noted to have fatty liver.     Patient also c/o frequent constipation - he is taking stool softner but doesn't seem to be helping much.  Home BG Monitoring:  Checking 1 time a day - only checking in am / fasting Average:  181 ( up from 150) Fasting in am: 170's   Highest - 262  Low fat/carbohydrate diet?  Trying to limit high CHO food but has not been as cautious recently Nicotine Abuse?  No Medication Compliance?  Yes Exercise?  Yes - 30 minutes treadmill and 30 minutes stationary bike Alcohol Abuse?  No   Lab Results  Component Value Date   HGBA1C 7.8 11/07/2014    [facility] results found for: [facility]  Lab Results  Component Value Date   CHOL 97* 11/07/2014   HDL 31* 11/07/2014   LDLCALC 50 02/28/2014   TRIG 109 11/07/2014   CHOLHDL 2.6 11/29/2013    RBG = 223 mg/dL today in office (about 2 hours post prandial)  Assessment: 1.  Diabetes.  Not at goal ; PP BG was elevated in office today 2.  Blood Pressure.  SBP elevated today but DBP at goal 3.  Lipids. - last showed LDL and Tg at goals, HDL low;  Patient is on statin 4.  Elevated LFTs / Fatty Liver - stable   Recommendations: 1.   Medication recommendations at this time are as follows:    Continue metformin to 1000mg  bid  Restart glipizide 5mg  take 1 tablet bid (increase in dose)  We discussed several other possible medications to try in the future - GLP1, retry Invokana and insulin 2.  Reviewed HBG goals:  Fasting 80-130 and 1-2 hour post prandial <180.  Patient is instructed to check BG 1 times per day and to start varying times he checks - sometime fasting and sometimes after a meal 3.  Continue to limit high CHO and high fat foods - reviewed serving sizes and CHO content of various foods. 4.  BP goal < 140/85.;  LDL goal of < 100, HDL > 40 and TG < 150.  5.  The following bowel / constipation plan was given to patient and discussed in detail.  1-Increase water and other fluids, fruit and vegetables (such as oranges, apples, prunes, broccoli)  and bran  2. If experiencing constipation then first try a stool softener such as Docusate sodium (Colace) 100 mg.  take 1 tablet from once to up to three times a day Or a vegetable based laxative with a stool softner such as Sennosides A&B (Senekot) Take 1 or 2 tablets  by mouth at once or twice a day    3. If no bowel movement by 48 hours, try Milk of Magnesia 30 to 60 milliliters up to twice a day    4. If still no bowel movement by 48 hours Bisacodyl suppository (Dulcolax) or tablet 10 mg once or twice a day  If still not bowel movement then call office - [phone]  RTC in 6 weeks. Orders Placed This Encounter  Procedures  . Microalbumin / creatinine urine ratio    Time spent counseling patient:  35 minutes  Referring provider:  Burnett Kanaris, PharmD, CPP, CDE

## 2015-01-17 NOTE — Patient Instructions (Signed)
Diabetes and Standards of Medical Care   Diabetes is complicated. You may find that your diabetes team includes a dietitian, nurse, diabetes educator, eye doctor, and more. To help everyone know what is going on and to help you get the care you deserve, the following schedule of care was developed to help keep you on track. Below are the tests, exams, vaccines, medicines, education, and plans you will need.  Blood Glucose Goals Prior to meals = 80 - 130 Within 2 hours of the start of a meal = less than 180  HbA1c test (goal is less than 7.0% - your last value was 7.8%) This test shows how well you have controlled your glucose over the past 2 to 3 months. It is used to see if your diabetes management plan needs to be adjusted.   It is performed at least 2 times a year if you are meeting treatment goals.  It is performed 4 times a year if therapy has changed or if you are not meeting treatment goals.  Blood pressure test  This test is performed at every routine medical visit. The goal is less than 140/90 mmHg for most people, but 130/80 mmHg in some cases. Ask your health care provider about your goal.  Dental exam  Follow up with the dentist regularly.  Eye exam  If you are diagnosed with type 1 diabetes as a child, get an exam upon reaching the age of 10 years or older and have had diabetes for 3 to 5 years. Yearly eye exams are recommended after that initial eye exam.  If you are diagnosed with type 1 diabetes as an adult, get an exam within 5 years of diagnosis and then yearly.  If you are diagnosed with type 2 diabetes, get an exam as soon as possible after the diagnosis and then yearly.  Foot care exam  Visual foot exams are performed at every routine medical visit. The exams check for cuts, injuries, or other problems with the feet.  A comprehensive foot exam should be done yearly. This includes visual inspection as well as assessing foot pulses and testing for loss of  sensation.  Check your feet nightly for cuts, injuries, or other problems with your feet. Tell your health care provider if anything is not healing.  Kidney function test (urine microalbumin)  This test is performed once a year.  Type 1 diabetes: The first test is performed 5 years after diagnosis.  Type 2 diabetes: The first test is performed at the time of diagnosis.  A serum creatinine and estimated glomerular filtration rate (eGFR) test is done once a year to assess the level of chronic kidney disease (CKD), if present.  Lipid profile (cholesterol, HDL, LDL, triglycerides)  Performed every 5 years for most people.  The goal for LDL is less than 100 mg/dL. If you are at high risk, the goal is less than 70 mg/dL.  The goal for HDL is 40 mg/dL to 50 mg/dL for men and 50 mg/dL to 60 mg/dL for women. An HDL cholesterol of 60 mg/dL or higher gives some protection against heart disease.  The goal for triglycerides is less than 150 mg/dL.  Influenza vaccine, pneumococcal vaccine, and hepatitis B vaccine  The influenza vaccine is recommended yearly.  The pneumococcal vaccine is generally given once in a lifetime. However, there are some instances when another vaccination is recommended. Check with your health care provider.  The hepatitis B vaccine is also recommended for adults with diabetes.    Diabetes self-management education  Education is recommended at diagnosis and ongoing as needed.  Treatment plan  Your treatment plan is reviewed at every medical visit.  Document Released: 04/19/2009 Document Revised: 02/22/2013 Document Reviewed: 11/22/2012 [employer] Patient Information 2014 [employer].   

## 2015-01-19 LAB — MICROALBUMIN / CREATININE URINE RATIO
Creatinine, Urine: 131.1 mg/dL
MICROALB/CREAT RATIO: 34.9 mg/g creat — ABNORMAL HIGH (ref 0.0–30.0)
MICROALBUM., U, RANDOM: 45.7 ug/mL

## 2015-02-06 LAB — HM DIABETES EYE EXAM

## 2015-02-18 ENCOUNTER — Ambulatory Visit (INDEPENDENT_AMBULATORY_CARE_PROVIDER_SITE_OTHER): Payer: BLUE CROSS/BLUE SHIELD | Admitting: Family Medicine

## 2015-02-18 ENCOUNTER — Encounter: Payer: Self-pay | Admitting: Family Medicine

## 2015-02-18 VITALS — BP 149/77 | HR 80 | Temp 98.8°F | Ht 70.0 in | Wt 212.2 lb

## 2015-02-18 DIAGNOSIS — R0981 Nasal congestion: Secondary | ICD-10-CM | POA: Diagnosis not present

## 2015-02-18 NOTE — Assessment & Plan Note (Signed)
Will try flonase, allergy pill and netty pot, if not improved in 5 days will call for antibiotic  

## 2015-02-18 NOTE — Progress Notes (Signed)
BP 149/77 mmHg  Pulse 80  Temp(Src) 98.8 F (37.1 C) (Oral)  Ht  (1.778 m)  Wt 212 lb 3.2 oz (96.253 kg)  BMI 30.45 kg/m2   Subjective:    Patient ID: [name] [name], male    DOB: 1956/01/09, 59 y.o.   MRN: [id number]  HPI: [name] [name] is a 59 y.o. male presenting on 02/18/2015 for Cough; Headache; chest congestion; and ears are full   HPI Sinus congestion Patient presents with a six-day history of nasal congestion, and sneezing, sinus pressure, headaches, postnasal drainage, and ear pressure. He denies any fevers or chills, shortness of breath, hearing loss, chest congestion. His wife also got this illness a few days after him. He has attempted to use Mucinex with little help.   Relevant past medical, surgical, family and social history reviewed and updated as indicated. Interim medical history since our last visit reviewed. Allergies and medications reviewed and updated.  Review of Systems  Constitutional: Negative for fever and chills.  HENT: Positive for ear pain, postnasal drip, rhinorrhea, sinus pressure, sneezing and sore throat. Negative for ear discharge and hearing loss.   Eyes: Negative for pain, discharge, redness and visual disturbance.  Respiratory: Positive for cough. Negative for shortness of breath and wheezing.   Cardiovascular: Negative for chest pain and leg swelling.  Gastrointestinal: Negative for abdominal pain, diarrhea and constipation.  Genitourinary: Negative for difficulty urinating.  Musculoskeletal: Negative for back pain and gait problem.  Skin: Negative for rash.  Neurological: Negative for syncope, light-headedness and headaches.  All other systems reviewed and are negative.   Per HPI unless specifically indicated above     Medication List       This list is accurate as of: 02/18/15  3:39 PM.  Always use your most recent med list.               acetaminophen 500 MG tablet  Commonly known as:  TYLENOL  Take 500 mg by mouth  every 6 (six) hours as needed.     atenolol 25 MG tablet  Commonly known as:  TENORMIN  Take 1 tablet (25 mg total) by mouth daily.     atorvastatin 40 MG tablet  Commonly known as:  LIPITOR  TAKE 1 TABLET DAILY     cholecalciferol 1000 UNITS tablet  Commonly known as:  VITAMIN D  Take 2,000 Units by mouth daily.     fluticasone 50 MCG/ACT nasal spray  Commonly known as:  FLONASE  Place 2 sprays into the nose daily as needed.     glipiZIDE 5 MG tablet  Commonly known as:  GLUCOTROL  Take 1 tablet (5 mg total) by mouth 2 (two) times daily before a meal.     guaiFENesin 600 MG 12 hr tablet  Commonly known as:  MUCINEX  Take by mouth 2 (two) times daily.     lisinopril 20 MG tablet  Commonly known as:  PRINIVIL,ZESTRIL  Take 1 tablet (20 mg total) by mouth daily.     metFORMIN 1000 MG tablet  Commonly known as:  GLUCOPHAGE  Take 1 tablet (1,000 mg total) by mouth 2 (two) times daily with a meal.     promethazine 25 MG tablet  Commonly known as:  PHENERGAN  Take 1 tablet (25 mg total) by mouth every 4 (four) hours as needed.     ranitidine 150 MG tablet  Commonly known as:  ZANTAC  Take 150 mg by mouth at bedtime.  SB LOW DOSE ASA EC 81 MG EC tablet  Generic drug:  aspirin  Take 81 mg by mouth daily.     temazepam 15 MG capsule  Commonly known as:  RESTORIL  Take 1 capsule (15 mg total) by mouth at bedtime as needed.     Testosterone 20.25 MG/ACT (1.62%) Gel  Commonly known as:  ANDROGEL PUMP  APPLY 3 PUMPS TOPICALLY EVERY DAY           Objective:    BP 149/77 mmHg  Pulse 80  Temp(Src) 98.8 F (37.1 C) (Oral)  Ht 5\' 10"  (1.778 m)  Wt 212 lb 3.2 oz (96.253 kg)  BMI 30.45 kg/m2  Wt Readings from Last 3 Encounters:  02/18/15 212 lb 3.2 oz (96.253 kg)  01/17/15 215 lb (97.523 kg)  12/10/14 214 lb (97.07 kg)    Physical Exam  Constitutional: He is oriented to person, place, and time. He appears well-developed and well-nourished. No distress.  HENT:    Right Ear: Hearing and ear canal normal. No drainage or tenderness. Tympanic membrane is bulging. Tympanic membrane is not injected, not scarred, not perforated and not erythematous. No decreased hearing is noted.  Left Ear: Hearing and ear canal normal. No drainage or tenderness. Tympanic membrane is bulging. Tympanic membrane is not injected, not scarred, not perforated and not erythematous. No decreased hearing is noted.  Nose: Mucosal edema and rhinorrhea present. No sinus tenderness, nasal deformity or septal deviation. Right sinus exhibits maxillary sinus tenderness and frontal sinus tenderness. Left sinus exhibits maxillary sinus tenderness and frontal sinus tenderness.  Mouth/Throat: Uvula is midline and mucous membranes are normal. Posterior oropharyngeal edema and posterior oropharyngeal erythema present. No oropharyngeal exudate or tonsillar abscesses.  Eyes: Conjunctivae and EOM are normal. Pupils are equal, round, and reactive to light. Right eye exhibits no discharge. No scleral icterus.  Cardiovascular: Normal rate, regular rhythm, normal heart sounds and intact distal pulses.   No murmur heard. Pulmonary/Chest: Effort normal and breath sounds normal. No respiratory distress. He has no wheezes.  Abdominal: He exhibits no distension.  Musculoskeletal: Normal range of motion. He exhibits no edema.  Neurological: He is alert and oriented to person, place, and time. Coordination normal.  Skin: Skin is warm and dry. No rash noted. He is not diaphoretic.  Psychiatric: He has a normal mood and affect. His behavior is normal.  Vitals reviewed.   Results for orders placed or performed in visit on 01/17/15  Microalbumin / creatinine urine ratio  Result Value Ref Range   Creatinine, Urine 131.1 Not Estab. mg/dL   Microalbum.,U,Random 45.7 Not Estab. ug/mL   MICROALB/CREAT RATIO 34.9 (H) 0.0 - 30.0 mg/g creat      Assessment & Plan:   Problem List Items Addressed This Visit       Respiratory   Sinus congestion - Primary    Will try flonase, allergy pill and netty pot, if not improved in 5 days will call for antibiotic          Follow up plan: Return if symptoms worsen or fail to improve.  Arville Care, MD [facility] 02/18/2015, 3:39 PM

## 2015-02-18 NOTE — Patient Instructions (Signed)

## 2015-02-20 ENCOUNTER — Encounter: Payer: Self-pay | Admitting: *Deleted

## 2015-02-20 ENCOUNTER — Telehealth: Payer: Self-pay | Admitting: Family Medicine

## 2015-02-20 MED ORDER — AZITHROMYCIN 250 MG PO TABS: ORAL_TABLET | ORAL | Status: DC

## 2015-02-20 NOTE — Telephone Encounter (Signed)
Pt aware  Rx was sent to pharmacy. 

## 2015-02-24 ENCOUNTER — Other Ambulatory Visit: Payer: Self-pay | Admitting: Family Medicine

## 2015-03-26 ENCOUNTER — Ambulatory Visit (INDEPENDENT_AMBULATORY_CARE_PROVIDER_SITE_OTHER): Payer: BLUE CROSS/BLUE SHIELD | Admitting: Family Medicine

## 2015-03-26 ENCOUNTER — Encounter: Payer: Self-pay | Admitting: Family Medicine

## 2015-03-26 VITALS — BP 127/78 | HR 73 | Temp 97.9°F | Ht 70.0 in | Wt 215.0 lb

## 2015-03-26 DIAGNOSIS — E559 Vitamin D deficiency, unspecified: Secondary | ICD-10-CM

## 2015-03-26 DIAGNOSIS — I10 Essential (primary) hypertension: Secondary | ICD-10-CM | POA: Diagnosis not present

## 2015-03-26 DIAGNOSIS — E1169 Type 2 diabetes mellitus with other specified complication: Secondary | ICD-10-CM

## 2015-03-26 DIAGNOSIS — E349 Endocrine disorder, unspecified: Secondary | ICD-10-CM

## 2015-03-26 DIAGNOSIS — I7121 Aneurysm of the ascending aorta, without rupture: Secondary | ICD-10-CM

## 2015-03-26 DIAGNOSIS — E785 Hyperlipidemia, unspecified: Secondary | ICD-10-CM | POA: Diagnosis not present

## 2015-03-26 DIAGNOSIS — E1165 Type 2 diabetes mellitus with hyperglycemia: Secondary | ICD-10-CM | POA: Diagnosis not present

## 2015-03-26 DIAGNOSIS — N400 Enlarged prostate without lower urinary tract symptoms: Secondary | ICD-10-CM

## 2015-03-26 DIAGNOSIS — E291 Testicular hypofunction: Secondary | ICD-10-CM | POA: Diagnosis not present

## 2015-03-26 DIAGNOSIS — Z0000 Encounter for general adult medical examination without abnormal findings: Secondary | ICD-10-CM

## 2015-03-26 DIAGNOSIS — I712 Thoracic aortic aneurysm, without rupture: Secondary | ICD-10-CM | POA: Diagnosis not present

## 2015-03-26 DIAGNOSIS — IMO0001 Reserved for inherently not codable concepts without codable children: Secondary | ICD-10-CM

## 2015-03-26 LAB — POCT URINALYSIS DIPSTICK
BILIRUBIN UA: NEGATIVE
GLUCOSE UA: NEGATIVE
KETONES UA: NEGATIVE
LEUKOCYTES UA: NEGATIVE
Nitrite, UA: NEGATIVE
Protein, UA: NEGATIVE
SPEC GRAV UA: 1.025
Urobilinogen, UA: NEGATIVE
pH, UA: 5

## 2015-03-26 LAB — POCT UA - MICROSCOPIC ONLY
BACTERIA, U MICROSCOPIC: NEGATIVE
CASTS, UR, LPF, POC: NEGATIVE
CRYSTALS, UR, HPF, POC: NEGATIVE
MUCUS UA: NEGATIVE
WBC, Ur, HPF, POC: NEGATIVE
YEAST UA: NEGATIVE

## 2015-03-26 MED ORDER — TESTOSTERONE 20.25 MG/ACT (1.62%) TD GEL: TRANSDERMAL | Status: DC

## 2015-03-26 NOTE — Patient Instructions (Addendum)
Continue current medications. Continue good therapeutic lifestyle changes which include good diet and exercise. Fall precautions discussed with patient. If an FOBT was given today- please return it to our front desk. If you are over 60 years old - you may need Prevnar 13 or the adult Pneumonia vaccine.  **Flu shots will be available soon--- please call and schedule a FLU-CLINIC appointment**  After your visit with [place] today you will receive a survey in the mail or online from [employer] regarding your care with [place]. Please take a moment to fill this out. Your feedback is very important to [place] as you can help [place] better understand your patient needs as well as improve your experience and satisfaction. WE CARE ABOUT YOU!!!   **Please join [place] SEPT.[email or web address], 2016 from 5:00 to 7:00pm for our OPEN HOUSE! Come out and meet our NEW providers** Check blood pressures and blood sugars regularly at home Continue to use plain Allegra or Claritin or Zyrtec along with the flonase Use nasal saline during the day Take Mucinex maximum strength plain, blue and white in color, 1 twice daily with a large glass of water Wear respiratory protection when out in the environment Don't forget to get your flu shot You can purchase Biotene over-the-counter this may help your mouth dryness Drink plenty of water and fluids Use Zantac twice daily if you're having more episodes of heartburn and avoid NSAIDs like ibuprofen and Aleve

## 2015-03-26 NOTE — Progress Notes (Signed)
Subjective:    Patient ID: [name] [name], male    DOB: 12/17/1955, 59 y.o.   MRN: [id number]  HPI Patient is here today for annual wellness exam and follow up of chronic medical problems which includes diabetes, hypertension, and hyperlipidemia. He is taking medications regularly. The patient has multiple problems. He does have a history of paroxysmal atrial fibrillation and sees a cardiologist periodically for this. He also has a history of sleep apnea. He is continued to have problems with sinus congestion and drainage and also has a dry mouth. He is using his Flonase regularly. He does also have an ascending aortic aneurysm. He also has testosterone deficiency and is on AndroGel for this. The aortic aneurysm is evaluated yearly by his cardiologist and it was evaluated this past spring. The patient denies chest pain. If he walks for long distance he does have some shortness of breath. He is also has some occasional bouts of indigestion and takes Zantac for this. He denies any blood in the stool or black tarry bowel movements and is passing his water okay. He does have occasional constipation and takes a stool softener for this. He has had his eyes checked recently and gets this done at least yearly.      Patient Active Problem List   Diagnosis Date Noted  . Sinus congestion 02/18/2015  . GERD (gastroesophageal reflux disease) 03/08/2014  . NAFLD (nonalcoholic fatty liver disease) 03/08/2014  . Elevated liver enzymes 03/08/2014  . Ascending aortic aneurysm 12/12/2012  . INSOMNIA 09/15/2010  . ATRIAL FIBRILLATION, PAROXYSMAL, HX OF 09/15/2010  . CAD (coronary artery disease) 07/09/2009  . AORTIC VALVE DISORDERS 05/27/2009  . OSTEOARTHRITIS, LOCALIZED 05/27/2009  . Diabetes mellitus type 2, uncontrolled, without complications 40/98/1191  . Hyperlipidemia with target LDL less than 70 05/24/2009  . Hypokalemia 05/24/2009  . HYPERTENSION 05/24/2009  . UNSPECIFIED SLEEP APNEA 05/24/2009    Outpatient Encounter Prescriptions as of 03/26/2015  Medication Sig  . acetaminophen (TYLENOL) 500 MG tablet Take 500 mg by mouth every 6 (six) hours as needed.  [name] aspirin (SB LOW DOSE ASA EC) 81 MG EC tablet Take 81 mg by mouth daily.    [name] atenolol (TENORMIN) 25 MG tablet Take 1 tablet (25 mg total) by mouth daily.  [name] atorvastatin (LIPITOR) 40 MG tablet TAKE 1 TABLET DAILY  . cholecalciferol (VITAMIN D) 1000 UNITS tablet Take 2,000 Units by mouth daily.   . fluticasone (FLONASE) 50 MCG/ACT nasal spray Place 2 sprays into the nose daily as needed.  [name] glipiZIDE (GLUCOTROL) 5 MG tablet Take 1 tablet (5 mg total) by mouth 2 (two) times daily before a meal.  . guaiFENesin (MUCINEX) 600 MG 12 hr tablet Take by mouth 2 (two) times daily.  [name] lisinopril (PRINIVIL,ZESTRIL) 20 MG tablet Take 1 tablet (20 mg total) by mouth daily.  . metFORMIN (GLUCOPHAGE) 1000 MG tablet TAKE 1 TABLET TWICE DAILY WITH MEALS  . promethazine (PHENERGAN) 25 MG tablet Take 1 tablet (25 mg total) by mouth every 4 (four) hours as needed.  . ranitidine (ZANTAC) 150 MG tablet Take 150 mg by mouth at bedtime.  . temazepam (RESTORIL) 15 MG capsule Take 1 capsule (15 mg total) by mouth at bedtime as needed.  . Testosterone (ANDROGEL PUMP) 20.25 MG/ACT (1.62%) GEL APPLY 3 PUMPS TOPICALLY EVERY DAY  . [DISCONTINUED] azithromycin (ZITHROMAX) 250 MG tablet Take 2 the first day and then one each day after.   No facility-administered encounter medications on file as of 03/26/2015.  Review of Systems  Constitutional: Negative.   HENT: Positive for sinus pressure.        Dry mouth  Eyes: Negative.   Respiratory: Negative.   Cardiovascular: Negative.   Gastrointestinal: Negative.   Endocrine: Negative.   Genitourinary: Negative.   Musculoskeletal: Negative.   Skin: Negative.   Allergic/Immunologic: Negative.   Neurological: Negative.   Hematological: Negative.   Psychiatric/Behavioral: Negative.        Objective:    Physical Exam  Constitutional: He is oriented to person, place, and time. He appears well-developed and well-nourished.  Alert and pleasant  HENT:  Head: Normocephalic and atraumatic.  Right Ear: External ear normal.  Left Ear: External ear normal.  Mouth/Throat: Oropharynx is clear and moist. No oropharyngeal exudate.  Nasal turbinate congestion bilaterally  Eyes: Conjunctivae and EOM are normal. Pupils are equal, round, and reactive to light. Right eye exhibits no discharge. Left eye exhibits no discharge. No scleral icterus.  Neck: Normal range of motion. Neck supple. No thyromegaly present.  No bruits adenopathy or thyromegaly  Cardiovascular: Normal rate, regular rhythm, normal heart sounds and intact distal pulses.   No murmur heard. At 72/m  Pulmonary/Chest: Effort normal and breath sounds normal. No respiratory distress. He has no wheezes. He has no rales. He exhibits no tenderness.  Abdominal: Soft. Bowel sounds are normal. He exhibits no mass. There is no tenderness. There is no rebound and no guarding.  The patient appears to have a ventral hernia when examined in the supine position. There are no inguinal nodes.  Genitourinary: Rectum normal and penis normal.  The prostate is slightly enlarged and there are no hard areas palpated. There is no rectal masses. There are no inguinal hernias palpable and the external genitalia were within normal limits.  Musculoskeletal: Normal range of motion. He exhibits no edema.  Lymphadenopathy:    He has no cervical adenopathy.  Neurological: He is alert and oriented to person, place, and time. He has normal reflexes. No cranial nerve deficit.  Skin: Skin is warm and dry. No rash noted.  Psychiatric: He has a normal mood and affect. His behavior is normal. Judgment and thought content normal.  Nursing note and vitals reviewed.    BP 127/78 mmHg  Pulse 73  Temp(Src) 97.9 F (36.6 C) (Oral)  Ht 5' 10" (1.778 m)  Wt 215 lb (97.523 kg)   BMI 30.85 kg/m2      Assessment & Plan:  1. Annual physical exam -The patient appears to be up-to-date on his health care parameters. He is aware that he needs to get his flu shot during the month of October. He will get lab work today. He sees his cardiologist yearly. - BMP8+EGFR - Hepatic function panel - CBC with Differential/Platelet - NMR, lipoprofile - Thyroid Panel With TSH - Testosterone,Free and Total - POCT urinalysis dipstick - POCT UA - Microscopic Only - PSA  2. Diabetes mellitus type 2, uncontrolled, without complications -His blood sugars at home have been running in the 150 range fasting and less than 200 during the day. He will continue his current treatment pending results of lab work - BMP8+EGFR - CBC with Differential/Platelet  3. Hyperlipidemia associated with type 2 diabetes mellitus -He will continue his current treatment for cholesterol pending results of lab work - CBC with Differential/Platelet - NMR, lipoprofile  4. Essential hypertension The blood pressure is good today and he will continue with current treatment BMP8+EGFR - Hepatic function panel - CBC with Differential/Platelet  5. Testosterone  deficiency -A new prescription for AndroGel was given today and he has no complaints of fatigue or sexual dysfunction. He will continue with this treatment. - CBC with Differential/Platelet - Testosterone,Free and Total - POCT urinalysis dipstick - POCT UA - Microscopic Only - PSA  6. Vitamin D deficiency -Continue current treatment pending results of lab work - CBC with Differential/Platelet  7. BPH (benign prostatic hyperplasia) -He is having no issues with voiding even though the prostate is slightly enlarged without lumps. - CBC with Differential/Platelet - Testosterone,Free and Total - POCT urinalysis dipstick - POCT UA - Microscopic Only - PSA  8. Ascending aortic aneurysm -This is followed regularly by the cardiologist. He says a CT scan  was done this past spring.  Meds ordered this encounter  Medications  . Testosterone (ANDROGEL PUMP) 20.25 MG/ACT (1.62%) GEL    Sig: APPLY 3 PUMPS TOPICALLY EVERY DAY    Dispense:  75 g    Refill:  3    02/16/2014 11:40:59 AM   Patient Instructions  Continue current medications. Continue good therapeutic lifestyle changes which include good diet and exercise. Fall precautions discussed with patient. If an FOBT was given today- please return it to our front desk. If you are over 50 years old - you may need Prevnar 13 or the adult Pneumonia vaccine.  **Flu shots will be available soon--- please call and schedule a FLU-CLINIC appointment**  After your visit with us today you will receive a survey in the mail or online from [employer] regarding your care with us. Please take a moment to fill this out. Your feedback is very important to us as you can help us better understand your patient needs as well as improve your experience and satisfaction. WE CARE ABOUT YOU!!!   **Please join us SEPT.[email or web address], 2016 from 5:00 to 7:00pm for our OPEN HOUSE! Come out and meet our NEW providers** Check blood pressures and blood sugars regularly at home Continue to use plain Allegra or Claritin or Zyrtec along with the flonase Use nasal saline during the day Take Mucinex maximum strength plain, blue and white in color, 1 twice daily with a large glass of water Wear respiratory protection when out in the environment Don't forget to get your flu shot You can purchase Biotene over-the-counter this may help your mouth dryness Drink plenty of water and fluids Use Zantac twice daily if you're having more episodes of heartburn and avoid NSAIDs like ibuprofen and Aleve   Don W. Belvin MD   

## 2015-03-27 LAB — CBC WITH DIFFERENTIAL/PLATELET
BASOS: 0 %
Basophils Absolute: 0 10*3/uL (ref 0.0–0.2)
EOS (ABSOLUTE): 0.1 10*3/uL (ref 0.0–0.4)
EOS: 2 %
HEMATOCRIT: 41.8 % (ref 37.5–51.0)
Hemoglobin: 13.8 g/dL (ref 12.6–17.7)
IMMATURE GRANS (ABS): 0 10*3/uL (ref 0.0–0.1)
IMMATURE GRANULOCYTES: 0 %
LYMPHS: 22 %
Lymphocytes Absolute: 1.3 10*3/uL (ref 0.7–3.1)
MCH: 31.4 pg (ref 26.6–33.0)
MCHC: 33 g/dL (ref 31.5–35.7)
MCV: 95 fL (ref 79–97)
Monocytes Absolute: 0.4 10*3/uL (ref 0.1–0.9)
Monocytes: 7 %
NEUTROS ABS: 4.1 10*3/uL (ref 1.4–7.0)
NEUTROS PCT: 69 %
Platelets: 225 10*3/uL (ref 150–379)
RBC: 4.39 x10E6/uL (ref 4.14–5.80)
RDW: 13.5 % (ref 12.3–15.4)
WBC: 6 10*3/uL (ref 3.4–10.8)

## 2015-03-27 LAB — THYROID PANEL WITH TSH
Free Thyroxine Index: 2.1 (ref 1.2–4.9)
T3 Uptake Ratio: 27 % (ref 24–39)
T4 TOTAL: 7.9 ug/dL (ref 4.5–12.0)
TSH: 1.74 u[IU]/mL (ref 0.450–4.500)

## 2015-03-27 LAB — TESTOSTERONE,FREE AND TOTAL
TESTOSTERONE FREE: 5.5 pg/mL — AB (ref 7.2–24.0)
TESTOSTERONE: 268 ng/dL — AB (ref 348–1197)

## 2015-03-27 LAB — BMP8+EGFR
BUN/Creatinine Ratio: 14 (ref 9–20)
BUN: 13 mg/dL (ref 6–24)
CALCIUM: 9.2 mg/dL (ref 8.7–10.2)
CO2: 22 mmol/L (ref 18–29)
CREATININE: 0.95 mg/dL (ref 0.76–1.27)
Chloride: 99 mmol/L (ref 97–108)
GFR calc Af Amer: 101 mL/min/{1.73_m2} (ref >59)
GFR, EST NON AFRICAN AMERICAN: 87 mL/min/{1.73_m2} (ref >59)
GLUCOSE: 162 mg/dL — AB (ref 65–99)
POTASSIUM: 4.5 mmol/L (ref 3.5–5.2)
SODIUM: 141 mmol/L (ref 134–144)

## 2015-03-27 LAB — NMR, LIPOPROFILE
CHOLESTEROL: 95 mg/dL — AB (ref 100–199)
HDL CHOLESTEROL BY NMR: 26 mg/dL — AB (ref >39)
HDL PARTICLE NUMBER: 24.1 umol/L — AB (ref >=30.5)
LDL Particle Number: 987 nmol/L (ref <1000)
LDL SIZE: 19.9 nm (ref >20.5)
LDL-C: 56 mg/dL (ref 0–99)
LP-IR Score: 56 — ABNORMAL HIGH (ref <=45)
SMALL LDL PARTICLE NUMBER: 769 nmol/L — AB (ref <=527)
TRIGLYCERIDES BY NMR: 65 mg/dL (ref 0–149)

## 2015-03-27 LAB — PSA: PROSTATE SPECIFIC AG, SERUM: 0.3 ng/mL (ref 0.0–4.0)

## 2015-03-27 LAB — HEPATIC FUNCTION PANEL
ALBUMIN: 4.7 g/dL (ref 3.5–5.5)
ALK PHOS: 78 IU/L (ref 39–117)
ALT: 53 IU/L — ABNORMAL HIGH (ref 0–44)
AST: 40 IU/L (ref 0–40)
BILIRUBIN TOTAL: 0.7 mg/dL (ref 0.0–1.2)
Bilirubin, Direct: 0.21 mg/dL (ref 0.00–0.40)
TOTAL PROTEIN: 7.2 g/dL (ref 6.0–8.5)

## 2015-03-31 ENCOUNTER — Other Ambulatory Visit: Payer: Self-pay | Admitting: Family Medicine

## 2015-04-25 ENCOUNTER — Ambulatory Visit (INDEPENDENT_AMBULATORY_CARE_PROVIDER_SITE_OTHER): Payer: BLUE CROSS/BLUE SHIELD

## 2015-04-25 DIAGNOSIS — Z23 Encounter for immunization: Secondary | ICD-10-CM | POA: Diagnosis not present

## 2015-04-25 DIAGNOSIS — Z87448 Personal history of other diseases of urinary system: Secondary | ICD-10-CM | POA: Diagnosis not present

## 2015-04-25 LAB — POCT UA - MICROSCOPIC ONLY
Bacteria, U Microscopic: NEGATIVE
Casts, Ur, LPF, POC: NEGATIVE
Crystals, Ur, HPF, POC: NEGATIVE
WBC, UR, HPF, POC: NEGATIVE
YEAST UA: NEGATIVE

## 2015-04-25 LAB — POCT URINALYSIS DIPSTICK
Bilirubin, UA: NEGATIVE
Glucose, UA: NEGATIVE
Leukocytes, UA: NEGATIVE
NITRITE UA: NEGATIVE
PH UA: 5
UROBILINOGEN UA: NEGATIVE

## 2015-04-26 NOTE — Addendum Note (Signed)
Addended by: Tamera PuntWRAY, Caitlan Chauca S on: 04/26/2015 04:06 PM   Modules accepted: Orders

## 2015-05-10 ENCOUNTER — Other Ambulatory Visit: Payer: Self-pay | Admitting: Family Medicine

## 2015-06-10 ENCOUNTER — Telehealth: Payer: Self-pay | Admitting: Family Medicine

## 2015-06-10 NOTE — Telephone Encounter (Signed)
[facility] and [facility]

## 2015-06-18 ENCOUNTER — Ambulatory Visit (HOSPITAL_COMMUNITY)
Admission: RE | Admit: 2015-06-18 | Discharge: 2015-06-18 | Disposition: A | Discharge status: Home / Self Care | Payer: BLUE CROSS/BLUE SHIELD | Source: Ambulatory Visit | Attending: Urology | Admitting: Urology

## 2015-06-18 ENCOUNTER — Ambulatory Visit (INDEPENDENT_AMBULATORY_CARE_PROVIDER_SITE_OTHER): Payer: BLUE CROSS/BLUE SHIELD | Admitting: Urology

## 2015-06-18 ENCOUNTER — Other Ambulatory Visit: Payer: Self-pay | Admitting: Urology

## 2015-06-18 DIAGNOSIS — R3129 Other microscopic hematuria: Secondary | ICD-10-CM | POA: Diagnosis not present

## 2015-06-18 DIAGNOSIS — M479 Spondylosis, unspecified: Secondary | ICD-10-CM | POA: Insufficient documentation

## 2015-06-18 DIAGNOSIS — N200 Calculus of kidney: Secondary | ICD-10-CM | POA: Diagnosis not present

## 2015-06-18 DIAGNOSIS — I2510 Atherosclerotic heart disease of native coronary artery without angina pectoris: Secondary | ICD-10-CM | POA: Diagnosis not present

## 2015-06-18 DIAGNOSIS — E291 Testicular hypofunction: Secondary | ICD-10-CM | POA: Diagnosis not present

## 2015-06-18 DIAGNOSIS — N5201 Erectile dysfunction due to arterial insufficiency: Secondary | ICD-10-CM

## 2015-06-18 DIAGNOSIS — R319 Hematuria, unspecified: Secondary | ICD-10-CM | POA: Insufficient documentation

## 2015-06-18 DIAGNOSIS — M160 Bilateral primary osteoarthritis of hip: Secondary | ICD-10-CM | POA: Diagnosis not present

## 2015-07-07 ENCOUNTER — Other Ambulatory Visit: Payer: Self-pay | Admitting: Family Medicine

## 2015-07-17 ENCOUNTER — Ambulatory Visit (INDEPENDENT_AMBULATORY_CARE_PROVIDER_SITE_OTHER): Payer: BLUE CROSS/BLUE SHIELD | Admitting: Family Medicine

## 2015-07-17 ENCOUNTER — Encounter: Payer: Self-pay | Admitting: Family Medicine

## 2015-07-17 VITALS — BP 139/76 | HR 76 | Temp 98.1°F | Ht 70.0 in | Wt 215.0 lb

## 2015-07-17 DIAGNOSIS — E1165 Type 2 diabetes mellitus with hyperglycemia: Secondary | ICD-10-CM

## 2015-07-17 DIAGNOSIS — E1169 Type 2 diabetes mellitus with other specified complication: Secondary | ICD-10-CM | POA: Diagnosis not present

## 2015-07-17 DIAGNOSIS — I10 Essential (primary) hypertension: Secondary | ICD-10-CM

## 2015-07-17 DIAGNOSIS — E785 Hyperlipidemia, unspecified: Secondary | ICD-10-CM | POA: Diagnosis not present

## 2015-07-17 DIAGNOSIS — IMO0001 Reserved for inherently not codable concepts without codable children: Secondary | ICD-10-CM

## 2015-07-17 DIAGNOSIS — E291 Testicular hypofunction: Secondary | ICD-10-CM | POA: Diagnosis not present

## 2015-07-17 DIAGNOSIS — E349 Endocrine disorder, unspecified: Secondary | ICD-10-CM

## 2015-07-17 DIAGNOSIS — G4700 Insomnia, unspecified: Secondary | ICD-10-CM | POA: Diagnosis not present

## 2015-07-17 DIAGNOSIS — N400 Enlarged prostate without lower urinary tract symptoms: Secondary | ICD-10-CM

## 2015-07-17 DIAGNOSIS — E559 Vitamin D deficiency, unspecified: Secondary | ICD-10-CM | POA: Diagnosis not present

## 2015-07-17 LAB — POCT GLYCOSYLATED HEMOGLOBIN (HGB A1C): HEMOGLOBIN A1C: 8.1

## 2015-07-17 MED ORDER — TEMAZEPAM 15 MG PO CAPS: 15.0000 mg | ORAL_CAPSULE | Freq: Every evening | ORAL | Status: DC | PRN

## 2015-07-17 NOTE — Patient Instructions (Addendum)
Continue current medications. Continue good therapeutic lifestyle changes which include good diet and exercise. Fall precautions discussed with patient. If an FOBT was given today- please return it to our front desk. If you are over 61 years old - you may need Prevnar 13 or the adult Pneumonia vaccine.  **Flu shots are available--- please call and schedule a FLU-CLINIC appointment**  After your visit with us today you will receive a survey in the mail or online from [employer] regarding your care with us. Please take a moment to fill this out. Your feedback is very important to us as you can help us better understand your patient needs as well as improve your experience and satisfaction. WE CARE ABOUT YOU!!!   The patient should continue to stay active and exercise regularly and continue to watch his diet closely He should use nasal saline and Mucinex as directed and keep the house as cool as possible. A cool mist humidifier will also be helpful to run in the bedroom at nighttime

## 2015-07-17 NOTE — Progress Notes (Signed)
Subjective:    Patient ID: [name] [name], male    DOB: Jul 12, 1955, 60 y.o.   MRN: [id number]  HPI Pt here for follow up and management of chronic medical problems which includes diabetes, hyperlipidemia, and hypertension. He is taking medications regularly. This patient has a history of paroxysmal atrial fibrillation and an ascending aortic aneurysm. He has aortic valve disease. He is followed regularly by the cardiologist. He also has hyperlipidemia. He has type 2 diabetes mellitus. The patient today does complain of his ears being stopped up and some head congestion. He is requesting a refill on his temazepam. The patient denies chest pain and only has shortness of breath when he is climbing hills. He does have some heartburn and indigestion and does take his Zantac twice daily. I have instructed him to increase the Zantac to an extra half if needed for indigestion instead of taking times. He has not seen any blood in the stool or had any black tarry bowel movements. He is passing his water without problems. He has appointments coming up with the cardiologist. He also has an appointment with the urologist because of follow-up of kidney stones.      Patient Active Problem List   Diagnosis Date Noted  . Sinus congestion 02/18/2015  . GERD (gastroesophageal reflux disease) 03/08/2014  . NAFLD (nonalcoholic fatty liver disease) 03/08/2014  . Elevated liver enzymes 03/08/2014  . Ascending aortic aneurysm ([place]) 12/12/2012  . INSOMNIA 09/15/2010  . ATRIAL FIBRILLATION, PAROXYSMAL, HX OF 09/15/2010  . CAD (coronary artery disease) 07/09/2009  . AORTIC VALVE DISORDERS 05/27/2009  . OSTEOARTHRITIS, LOCALIZED 05/27/2009  . Diabetes mellitus type 2, uncontrolled, without complications ([place]) 90/30/0923  . Hyperlipidemia with target LDL less than 70 05/24/2009  . Hypokalemia 05/24/2009  . HYPERTENSION 05/24/2009  . UNSPECIFIED SLEEP APNEA 05/24/2009   Outpatient Encounter Prescriptions as of  07/17/2015  Medication Sig  . acetaminophen (TYLENOL) 500 MG tablet Take 500 mg by mouth every 6 (six) hours as needed.  [name] aspirin (SB LOW DOSE ASA EC) 81 MG EC tablet Take 81 mg by mouth daily.    [name] atenolol (TENORMIN) 25 MG tablet Take 1 tablet (25 mg total) by mouth daily.  [name] atorvastatin (LIPITOR) 40 MG tablet TAKE 1 TABLET DAILY  . cholecalciferol (VITAMIN D) 1000 UNITS tablet Take 2,000 Units by mouth daily.   . fluticasone (FLONASE) 50 MCG/ACT nasal spray Place 2 sprays into the nose daily as needed.  [name] glipiZIDE (GLUCOTROL) 5 MG tablet Take 1 tablet (5 mg total) by mouth 2 (two) times daily before a meal.  . guaiFENesin (MUCINEX) 600 MG 12 hr tablet Take by mouth 2 (two) times daily.  [name] lisinopril (PRINIVIL,ZESTRIL) 20 MG tablet TAKE 1 TABLET DAILY  . metFORMIN (GLUCOPHAGE) 1000 MG tablet TAKE 1 TABLET TWICE DAILY WITH MEALS  . promethazine (PHENERGAN) 25 MG tablet Take 1 tablet (25 mg total) by mouth every 4 (four) hours as needed.  . ranitidine (ZANTAC) 150 MG tablet Take 150 mg by mouth at bedtime.  . temazepam (RESTORIL) 15 MG capsule Take 1 capsule (15 mg total) by mouth at bedtime as needed.  . Testosterone (ANDROGEL PUMP) 20.25 MG/ACT (1.62%) GEL APPLY 3 PUMPS TOPICALLY EVERY DAY   No facility-administered encounter medications on file as of 07/17/2015.      Review of Systems  Constitutional: Negative.   HENT: Positive for congestion and postnasal drip.        Ears "stopped up"  Eyes: Negative.   Respiratory:  Negative.   Cardiovascular: Negative.   Gastrointestinal: Negative.   Endocrine: Negative.   Genitourinary: Negative.   Musculoskeletal: Negative.   Skin: Negative.   Allergic/Immunologic: Negative.   Neurological: Negative.   Hematological: Negative.   Psychiatric/Behavioral: Negative.        Objective:   Physical Exam  Constitutional: He is oriented to person, place, and time. He appears well-developed and well-nourished. No distress.  HENT:  Head:  Normocephalic and atraumatic.  Right Ear: External ear normal.  Left Ear: External ear normal.  Mouth/Throat: Oropharynx is clear and moist. No oropharyngeal exudate.  The TMs were normal bilaterally and the ear canals were clear of cerumen. There was nasal congestion and slight redness in the posterior throat.  Eyes: Conjunctivae and EOM are normal. Pupils are equal, round, and reactive to light. Right eye exhibits no discharge. Left eye exhibits no discharge. No scleral icterus.  Neck: Normal range of motion. Neck supple. No thyromegaly present.  No anterior cervical nodes or carotid bruits  Cardiovascular: Normal rate, regular rhythm and intact distal pulses.   Murmur heard. The rhythm was regular at 72/m with a grade 2/6 systolic ejection murmur  Pulmonary/Chest: Effort normal and breath sounds normal. No respiratory distress. He has no wheezes. He has no rales. He exhibits no tenderness.  Clear anteriorly and posteriorly  Abdominal: Soft. Bowel sounds are normal. He exhibits no mass. There is no tenderness. There is no rebound and no guarding.  No liver or spleen enlargement and no epigastric tenderness  Musculoskeletal: Normal range of motion. He exhibits no edema.  Ears DIP joint swelling in the hands and fingers and he is not having any problems with that currently.  Lymphadenopathy:    He has no cervical adenopathy.  Neurological: He is alert and oriented to person, place, and time. He has normal reflexes. No cranial nerve deficit.  Skin: Skin is warm and dry. No rash noted.  Psychiatric: He has a normal mood and affect. His behavior is normal. Judgment and thought content normal.  Nursing note and vitals reviewed.  BP 139/76 mmHg  Pulse 76  Temp(Src) 98.1 F (36.7 C) (Oral)  Ht 5\' 10"  (1.778 m)  Wt 215 lb (97.523 kg)  BMI 30.85 kg/m2        Assessment & Plan:  1. Uncontrolled type 2 diabetes mellitus without complication, without long-term current use of insulin  (HCC) -According to the hemoglobin A1c today his blood sugars now under excellent control with a hemoglobin A1c of 4.9%. We will double check this assists appears out of line with the blood sugars that were reviewed. - POCT glycosylated hemoglobin (Hb A1C) - CBC with Differential/Platelet  2. Essential hypertension -The blood pressure is good today and he will continue with current treatment - BMP8+EGFR - CBC with Differential/Platelet - Hepatic function panel  3. Hyperlipidemia associated with type 2 diabetes mellitus (HCC) -Continue current treatment pending results of lab work - CBC with Differential/Platelet - NMR, lipoprofile  4. Testosterone deficiency -His rectal and prostate and testosterone levels will be checked at the next visit - CBC with Differential/Platelet  5. Vitamin D deficiency -Continue current treatment pending results of lab work - CBC with Differential/Platelet - VITAMIN D 25 Hydroxy (Vit-D Deficiency, Fractures)  6. BPH (benign prostatic hyperplasia) -The patient has no symptoms associated with this today. - CBC with Differential/Platelet  7. Insomnia -His temazepam was refilled.  Meds ordered this encounter  Medications  . temazepam (RESTORIL) 15 MG capsule    Sig: Take  1 capsule (15 mg total) by mouth at bedtime as needed.    Dispense:  30 capsule    Refill:  3   Patient Instructions  Continue current medications. Continue good therapeutic lifestyle changes which include good diet and exercise. Fall precautions discussed with patient. If an FOBT was given today- please return it to our front desk. If you are over 76 years old - you may need Prevnar 11 or the adult Pneumonia vaccine.  **Flu shots are available--- please call and schedule a FLU-CLINIC appointment**  After your visit with [place] today you will receive a survey in the mail or online from [employer] regarding your care with [place]. Please take a moment to fill this out. Your feedback is  very important to [place] as you can help [place] better understand your patient needs as well as improve your experience and satisfaction. WE CARE ABOUT YOU!!!   The patient should continue to stay active and exercise regularly and continue to watch his diet closely He should use nasal saline and Mucinex as directed and keep the house as cool as possible. A cool mist humidifier will also be helpful to run in the bedroom at nighttime   Arrie Senate MD

## 2015-07-18 LAB — CBC WITH DIFFERENTIAL/PLATELET
BASOS ABS: 0 10*3/uL (ref 0.0–0.2)
BASOS: 0 %
EOS (ABSOLUTE): 0.1 10*3/uL (ref 0.0–0.4)
Eos: 2 %
Hematocrit: 41.5 % (ref 37.5–51.0)
Hemoglobin: 14.4 g/dL (ref 12.6–17.7)
IMMATURE GRANULOCYTES: 0 %
Immature Grans (Abs): 0 10*3/uL (ref 0.0–0.1)
Lymphocytes Absolute: 1.6 10*3/uL (ref 0.7–3.1)
Lymphs: 29 %
MCH: 32.5 pg (ref 26.6–33.0)
MCHC: 34.7 g/dL (ref 31.5–35.7)
MCV: 94 fL (ref 79–97)
MONOS ABS: 0.5 10*3/uL (ref 0.1–0.9)
Monocytes: 8 %
NEUTROS ABS: 3.4 10*3/uL (ref 1.4–7.0)
NEUTROS PCT: 61 %
PLATELETS: 217 10*3/uL (ref 150–379)
RBC: 4.43 x10E6/uL (ref 4.14–5.80)
RDW: 13.4 % (ref 12.3–15.4)
WBC: 5.6 10*3/uL (ref 3.4–10.8)

## 2015-07-18 LAB — BMP8+EGFR
BUN/Creatinine Ratio: 14 (ref 10–22)
BUN: 12 mg/dL (ref 8–27)
CALCIUM: 9.7 mg/dL (ref 8.6–10.2)
CHLORIDE: 100 mmol/L (ref 96–106)
CO2: 22 mmol/L (ref 18–29)
Creatinine, Ser: 0.85 mg/dL (ref 0.76–1.27)
GFR calc Af Amer: 109 mL/min/{1.73_m2} (ref >59)
GFR calc non Af Amer: 95 mL/min/{1.73_m2} (ref >59)
Glucose: 199 mg/dL — ABNORMAL HIGH (ref 65–99)
POTASSIUM: 4.7 mmol/L (ref 3.5–5.2)
SODIUM: 140 mmol/L (ref 134–144)

## 2015-07-18 LAB — HEPATIC FUNCTION PANEL
ALT: 75 IU/L — AB (ref 0–44)
AST: 45 IU/L — ABNORMAL HIGH (ref 0–40)
Albumin: 4.8 g/dL (ref 3.6–4.8)
Alkaline Phosphatase: 71 IU/L (ref 39–117)
Bilirubin Total: 0.5 mg/dL (ref 0.0–1.2)
Bilirubin, Direct: 0.17 mg/dL (ref 0.00–0.40)
TOTAL PROTEIN: 7.2 g/dL (ref 6.0–8.5)

## 2015-07-18 LAB — NMR, LIPOPROFILE
Cholesterol: 105 mg/dL (ref 100–199)
HDL CHOLESTEROL BY NMR: 28 mg/dL — AB (ref >39)
HDL Particle Number: 24.4 umol/L — ABNORMAL LOW (ref >=30.5)
LDL PARTICLE NUMBER: 996 nmol/L (ref <1000)
LDL Size: 19.8 nm (ref >20.5)
LDL-C: 57 mg/dL (ref 0–99)
LP-IR Score: 77 — ABNORMAL HIGH (ref <=45)
Small LDL Particle Number: 729 nmol/L — ABNORMAL HIGH (ref <=527)
TRIGLYCERIDES BY NMR: 101 mg/dL (ref 0–149)

## 2015-07-18 LAB — VITAMIN D 25 HYDROXY (VIT D DEFICIENCY, FRACTURES): Vit D, 25-Hydroxy: 40.9 ng/mL (ref 30.0–100.0)

## 2015-07-23 ENCOUNTER — Telehealth: Payer: Self-pay

## 2015-07-23 ENCOUNTER — Other Ambulatory Visit: Payer: Self-pay | Admitting: Family Medicine

## 2015-07-23 ENCOUNTER — Ambulatory Visit (INDEPENDENT_AMBULATORY_CARE_PROVIDER_SITE_OTHER): Payer: BLUE CROSS/BLUE SHIELD | Admitting: Urology

## 2015-07-23 DIAGNOSIS — R911 Solitary pulmonary nodule: Secondary | ICD-10-CM

## 2015-07-23 DIAGNOSIS — N200 Calculus of kidney: Secondary | ICD-10-CM

## 2015-07-24 ENCOUNTER — Encounter: Payer: Self-pay | Admitting: Pharmacist

## 2015-07-24 ENCOUNTER — Ambulatory Visit (INDEPENDENT_AMBULATORY_CARE_PROVIDER_SITE_OTHER): Payer: BLUE CROSS/BLUE SHIELD | Admitting: Pharmacist

## 2015-07-24 ENCOUNTER — Telehealth: Payer: Self-pay | Admitting: Pharmacist

## 2015-07-24 VITALS — BP 140/80 | HR 78 | Ht 70.0 in | Wt 219.0 lb

## 2015-07-24 DIAGNOSIS — IMO0001 Reserved for inherently not codable concepts without codable children: Secondary | ICD-10-CM

## 2015-07-24 DIAGNOSIS — E1165 Type 2 diabetes mellitus with hyperglycemia: Secondary | ICD-10-CM

## 2015-07-24 MED ORDER — EMPAGLIFLOZIN 25 MG PO TABS: 25.0000 mg | ORAL_TABLET | Freq: Every day | ORAL | Status: DC

## 2015-07-24 NOTE — Progress Notes (Signed)
Diabetes Follow-Up Visit Chief Complaint:   No chief complaint on file.    There were no vitals filed for this visit. There were no vitals filed for this visit. There is no weight on file to calculate BMI.   HPI: patient initially diagnosed with type 2 DM about 10 to 12 years ago.  Most recent A1c was 8.1% (up from 7.8%). I last saw patient about 6 months ago  Current Diabetes Medications:   metformin 1000mg  BID and glipizide  bid.  He took Januvia  in past but did not notice significant improvement in BG and this was also around the time he developed abdominal pain so Januvia was discontinued.  He also has elevated lipase (very slightly and elevated LFTs)  Has since been worked up by GI.  Per NP Durel Salts noted to have fatty liver.     Home BG Monitoring:  Checking 1 - 2 times a day  Average:  181 ( up from 150) Fasting in am: 170 - 200 Lowest - 140   Highest - 230  Low fat/carbohydrate diet?  Trying to avoid cabbage, onions and peppers because they have caused bloating and nausea in past.  Drinking sweet tea.  He limits bread intake.  Nicotine Abuse?  No Medication Compliance?  Yes Exercise?  Yes - but this has decreased over last 30 - 60 days.  Now back to 30 minutes treadmill and 30 minutes stationary bike Alcohol Abuse?  No   Lab Results  Component Value Date   HGBA1C 8.1 07/17/2015    No results found for: [facility]  Lab Results  Component Value Date   CHOL 105 07/17/2015   HDL 28* 07/17/2015   LDLCALC 50 02/28/2014   TRIG 101 07/17/2015   CHOLHDL 2.6 11/29/2013    RBG = 223 mg/dL today in office (about 2 hours post prandial)  Assessment: 1.  Diabetes.  Not at goal ; PP BG was elevated in office today 2.  Blood Pressure.  SBP elevated today but DBP at goal 3.  Lipids. - last showed LDL and Tg at goals, HDL low;  Patient is on statin 4.  Elevated LFTs / Fatty Liver - stable   Recommendations: 1.  Medication recommendations at this time are as  follows:    Continue metformin to  bid and glipizide  take 1 tablet bid   Add jardiance  - take 1 tablet daily 2.  Reviewed HBG goals:  Fasting 80-130 and 1-2 hour post prandial <180.  Patient is instructed to check BG 1 times per day and to start varying times he checks - sometime fasting and sometimes after a meal 3.  Continue to limit high CHO and high fat foods - patient is advised to avoid all sugar containing beverages especially sweet tea. 4.  BP goal < 140/85.;  LDL goal of < 100, HDL > 40 and TG < 150.   RTC in 4 weeks. No orders of the defined types were placed in this encounter.    Time spent counseling patient:  30 minutes  Referring provider:  Burnett naris, PharmD, CPP, CDE

## 2015-07-24 NOTE — Telephone Encounter (Signed)
Rx reordered and sent to [facility]

## 2015-07-24 NOTE — Patient Instructions (Signed)
Diabetes and Standards of Medical Care   Diabetes is complicated. You may find that your diabetes team includes a dietitian, nurse, diabetes educator, eye doctor, and more. To help everyone know what is going on and to help you get the care you deserve, the following schedule of care was developed to help keep you on track. Below are the tests, exams, vaccines, medicines, education, and plans you will need.  Blood Glucose Goals Prior to meals = 80 - 130 Within 2 hours of the start of a meal = less than 180  HbA1c test (goal is less than 7.0% - your last value was %) This test shows how well you have controlled your glucose over the past 2 to 3 months. It is used to see if your diabetes management plan needs to be adjusted.   It is performed at least 2 times a year if you are meeting treatment goals.  It is performed 4 times a year if therapy has changed or if you are not meeting treatment goals.  Blood pressure test  This test is performed at every routine medical visit. The goal is less than 140/90 mmHg for most people, but 130/80 mmHg in some cases. Ask your health care provider about your goal.  Dental exam  Follow up with the dentist regularly.  Eye exam  If you are diagnosed with type 1 diabetes as a child, get an exam upon reaching the age of 10 years or older and have had diabetes for 3 to 5 years. Yearly eye exams are recommended after that initial eye exam.  If you are diagnosed with type 1 diabetes as an adult, get an exam within 5 years of diagnosis and then yearly.  If you are diagnosed with type 2 diabetes, get an exam as soon as possible after the diagnosis and then yearly.  Foot care exam  Visual foot exams are performed at every routine medical visit. The exams check for cuts, injuries, or other problems with the feet.  A comprehensive foot exam should be done yearly. This includes visual inspection as well as assessing foot pulses and testing for loss of  sensation.  Check your feet nightly for cuts, injuries, or other problems with your feet. Tell your health care provider if anything is not healing.  Kidney function test (urine microalbumin)  This test is performed once a year.  Type 1 diabetes: The first test is performed 5 years after diagnosis.  Type 2 diabetes: The first test is performed at the time of diagnosis.  A serum creatinine and estimated glomerular filtration rate (eGFR) test is done once a year to assess the level of chronic kidney disease (CKD), if present.  Lipid profile (cholesterol, HDL, LDL, triglycerides)  Performed every 5 years for most people.  The goal for LDL is less than 100 mg/dL. If you are at high risk, the goal is less than 70 mg/dL.  The goal for HDL is 40 mg/dL to 50 mg/dL for men and 50 mg/dL to 60 mg/dL for women. An HDL cholesterol of 60 mg/dL or higher gives some protection against heart disease.  The goal for triglycerides is less than 150 mg/dL.  Influenza vaccine, pneumococcal vaccine, and hepatitis B vaccine  The influenza vaccine is recommended yearly.  The pneumococcal vaccine is generally given once in a lifetime. However, there are some instances when another vaccination is recommended. Check with your health care provider.  The hepatitis B vaccine is also recommended for adults with diabetes.    Diabetes self-management education  Education is recommended at diagnosis and ongoing as needed.  Treatment plan  Your treatment plan is reviewed at every medical visit.  Document Released: 04/19/2009 Document Revised: 02/22/2013 Document Reviewed: 11/22/2012 [employer] Patient Information 2014 [employer].   

## 2015-07-31 ENCOUNTER — Encounter: Payer: Self-pay | Admitting: Family Medicine

## 2015-08-04 ENCOUNTER — Other Ambulatory Visit: Payer: Self-pay | Admitting: Family Medicine

## 2015-08-05 ENCOUNTER — Ambulatory Visit (HOSPITAL_COMMUNITY): Payer: BLUE CROSS/BLUE SHIELD

## 2015-08-18 ENCOUNTER — Other Ambulatory Visit: Payer: Self-pay | Admitting: Family Medicine

## 2015-08-26 ENCOUNTER — Encounter: Payer: Self-pay | Admitting: Pharmacist

## 2015-08-26 ENCOUNTER — Ambulatory Visit (INDEPENDENT_AMBULATORY_CARE_PROVIDER_SITE_OTHER): Payer: BLUE CROSS/BLUE SHIELD | Admitting: Pharmacist

## 2015-08-26 VITALS — BP 138/82 | HR 77 | Ht 70.0 in | Wt 214.0 lb

## 2015-08-26 DIAGNOSIS — I10 Essential (primary) hypertension: Secondary | ICD-10-CM

## 2015-08-26 DIAGNOSIS — E1165 Type 2 diabetes mellitus with hyperglycemia: Secondary | ICD-10-CM | POA: Diagnosis not present

## 2015-08-26 DIAGNOSIS — E1169 Type 2 diabetes mellitus with other specified complication: Secondary | ICD-10-CM | POA: Diagnosis not present

## 2015-08-26 DIAGNOSIS — E669 Obesity, unspecified: Secondary | ICD-10-CM

## 2015-08-26 DIAGNOSIS — IMO0001 Reserved for inherently not codable concepts without codable children: Secondary | ICD-10-CM

## 2015-08-26 DIAGNOSIS — E785 Hyperlipidemia, unspecified: Secondary | ICD-10-CM

## 2015-08-26 NOTE — Patient Instructions (Signed)
Miconazole cream Terbinafine / Lamisil cream  Use either of the above and apply 2 times daily for 1 week - if not improved call office. Diabetes and Standards of Medical Care    Blood Glucose Goals Prior to meals = 80 - 130 Within 2 hours of the start of a meal = less than 180  HbA1c test (goal is less than 7.0% - your last value was %) This test shows how well you have controlled your glucose over the past 2 to 3 months. It is used to see if your diabetes management plan needs to be adjusted.   It is performed at least 2 times a year if you are meeting treatment goals.  It is performed 4 times a year if therapy has changed or if you are not meeting treatment goals.  Blood pressure test  This test is performed at every routine medical visit. The goal is less than 140/90 mmHg for most people, but 130/80 mmHg in some cases. Ask your health care provider about your goal.  Dental exam  Follow up with the dentist regularly.  Eye exam  If you are diagnosed with type 1 diabetes as a child, get an exam upon reaching the age of 71 years or older and have had diabetes for 3 to 5 years. Yearly eye exams are recommended after that initial eye exam.  If you are diagnosed with type 1 diabetes as an adult, get an exam within 5 years of diagnosis and then yearly.  If you are diagnosed with type 2 diabetes, get an exam as soon as possible after the diagnosis and then yearly.  Foot care exam  Visual foot exams are performed at every routine medical visit. The exams check for cuts, injuries, or other problems with the feet.  A comprehensive foot exam should be done yearly. This includes visual inspection as well as assessing foot pulses and testing for loss of sensation.  Check your feet nightly for cuts, injuries, or other problems with your feet. Tell your health care provider if anything is not healing.  Kidney function test (urine microalbumin)  This test is performed once a  year.  Type 1 diabetes: The first test is performed 5 years after diagnosis.  Type 2 diabetes: The first test is performed at the time of diagnosis.  A serum creatinine and estimated glomerular filtration rate (eGFR) test is done once a year to assess the level of chronic kidney disease (CKD), if present.  Lipid profile (cholesterol, HDL, LDL, triglycerides)  Performed every 5 years for most people.  The goal for LDL is less than 100 mg/dL. If you are at high risk, the goal is less than 70 mg/dL.  The goal for HDL is 40 mg/dL to 50 mg/dL for men and 50 mg/dL to 60 mg/dL for women. An HDL cholesterol of 60 mg/dL or higher gives some protection against heart disease.  The goal for triglycerides is less than 150 mg/dL.  Influenza vaccine, pneumococcal vaccine, and hepatitis B vaccine  The influenza vaccine is recommended yearly.  The pneumococcal vaccine is generally given once in a lifetime. However, there are some instances when another vaccination is recommended. Check with your health care provider.  The hepatitis B vaccine is also recommended for adults with diabetes.

## 2015-08-26 NOTE — Progress Notes (Signed)
Diabetes Follow-Up Visit Chief Complaint:   Chief Complaint  Patient presents with  . Diabetes     Filed Vitals:   08/26/15 0828  BP: 138/82  Pulse: 77   Filed Weights   08/26/15 0828  Weight: 214 lb (97.07 kg)   Body mass index is 30.71 kg/(m^2).   HPI: patient initially diagnosed with type 2 DM about 10 to 12 years ago.  Most recent A1c was 8.1% (up from 7.8%). I last saw patient in January 2017 and Jaridiance  1 tablet daily was started.  Current Diabetes Medications:  Jardiance  1 tablet daily,  metformin  BID and glipizide  bid.  He took Januvia  in past but did not notice significant improvement in BG and this was also around the time he developed abdominal pain so Januvia was discontinued.  He also has elevated lipase (very slightly and elevated LFTs)  Has since been worked up by GI.  Per NP Durel Salts noted to have fatty liver.     Home BG Monitoring:  Checking 1 - 2 times a day  Average:  165 (down from 180) Fasting in am: 146 - 191 Lowest - 120  Highest - 255  Low fat/carbohydrate diet? Drinking sweet tea less (3 to 4 times per week).  He limits bread intake. Limiting fast food.   Nicotine Abuse?  No Medication Compliance?  Yes Exercise?  Yes -  20 minutes treadmill per day and 30-45 minutes stationary bike about 5 days per week Alcohol Abuse?  No   Lab Results  Component Value Date   HGBA1C 8.1 07/17/2015    No results found for: [facility]  Lab Results  Component Value Date   CHOL 105 07/17/2015   HDL 28* 07/17/2015   LDLCALC 50 02/28/2014   TRIG 101 07/17/2015   CHOLHDL 2.6 11/29/2013    Patient does c/o redness and itching in penial area.  It comes a goes and he has applied cortisone 10 occasionally  Assessment: 1.  Diabetes.  BG decreasing since starting Jardiance 2.  Blood Pressure.  At goal today 3.  Lipids. - last showed LDL and Tg at goals, HDL low;  Patient is on statin 4.  Elevated LFTs / Fatty Liver - stable 5.   Obesity - weight has decreased 5# since starting Jardiance 6.  Fungal infection of penis which can occur more frequently with use of jardiance   Recommendations: 1.  Medication recommendations at this time are as follows:    Continue metformin to  bid and glipizide  take 1 tablet bid and jardiance  - take 1 tablet daily 2.  Reviewed HBG goals:  Fasting 80-130 and 1-2 hour post prandial <180.  Patient is instructed to check BG 1 times per day and to start varying times he checks - sometime fasting and sometimes after a meal 3.  Continue to limit high CHO and high fat foods - patient again reminded to avoid all sugar containing beverages especially sweet tea. 4.  BP goal < 140/85.;  LDL goal of < 100, HDL > 40 and TG < 150.  5.  Use miconazole or Lamisil cream bid for 1 week if no improvement then call office for appt   Time spent counseling patient:  30 minutes  Referring provider:  Burnett Kanaris, PharmD, CPP, CDE

## 2015-10-04 ENCOUNTER — Other Ambulatory Visit: Payer: Self-pay | Admitting: Pharmacist

## 2015-10-13 ENCOUNTER — Other Ambulatory Visit: Payer: Self-pay | Admitting: Family Medicine

## 2015-10-29 ENCOUNTER — Encounter: Payer: Self-pay | Admitting: Family

## 2015-10-29 ENCOUNTER — Ambulatory Visit (INDEPENDENT_AMBULATORY_CARE_PROVIDER_SITE_OTHER): Payer: BLUE CROSS/BLUE SHIELD | Admitting: Family

## 2015-10-29 VITALS — BP 128/70 | HR 64 | Temp 98.0°F | Ht 70.0 in | Wt 212.0 lb

## 2015-10-29 DIAGNOSIS — J0110 Acute frontal sinusitis, unspecified: Secondary | ICD-10-CM

## 2015-10-29 MED ORDER — AMOXICILLIN-POT CLAVULANATE 875-125 MG PO TABS: 1.0000 | ORAL_TABLET | Freq: Two times a day (BID) | ORAL | Status: DC

## 2015-10-29 NOTE — Progress Notes (Signed)
Subjective:    Patient ID: [name] [name], male    DOB: Mar 24, 1956, 60 y.o.   MRN: [id number]   He has been experiencing three days of runny nose, prodouctive cough and sinus pressure.  Sputum and nasal drainage have been clear and there has been no fever.  Started allegra, mucinex and flonase 3 days ago without any improvement.     Sinus Problem This is a new problem. The current episode started in the past 7 days. The problem is unchanged. There has been no fever. His pain is at a severity of 5/10. The pain is mild. Associated symptoms include coughing, headaches, shortness of breath, sinus pressure and sneezing. Pertinent negatives include no chills, congestion or sore throat. Treatments tried: Flonase, mucinex. The treatment provided mild relief.  Cough This is a new problem. The current episode started in the past 7 days. The problem has been unchanged. The problem occurs hourly. The cough is productive of sputum. Associated symptoms include chest pain, headaches, nasal congestion, postnasal drip, rhinorrhea, shortness of breath and wheezing. Pertinent negatives include no chills, ear congestion, fever or sore throat. Nothing aggravates the symptoms. He has tried OTC cough suppressant for the symptoms. The treatment provided mild relief. His past medical history is significant for environmental allergies.      Review of Systems  Constitutional: Negative for fever and chills.  HENT: Positive for postnasal drip, rhinorrhea, sinus pressure and sneezing. Negative for congestion and sore throat.   Eyes: Negative.   Respiratory: Positive for cough, shortness of breath and wheezing.   Cardiovascular: Positive for chest pain.  Gastrointestinal: Negative.   Endocrine: Negative.   Genitourinary: Negative.   Musculoskeletal: Negative.   Allergic/Immunologic: Positive for environmental allergies.  Neurological: Positive for headaches.  All other systems reviewed and are negative.        Objective:   Physical Exam  Constitutional: He is oriented to person, place, and time. Vital signs are normal.  HENT:  Right Ear: External ear normal.  Left Ear: Tympanic membrane normal.  Nose: Mucosal edema and rhinorrhea present. Right sinus exhibits maxillary sinus tenderness. Right sinus exhibits no frontal sinus tenderness. Left sinus exhibits maxillary sinus tenderness. Left sinus exhibits no frontal sinus tenderness.  Mouth/Throat: Oropharynx is clear and moist.  Nasal passage erythemas with mild swelling    Eyes: Conjunctivae are normal. Right eye exhibits no discharge.  Neck: Normal range of motion.  Cardiovascular: Normal rate and regular rhythm.   Murmur heard. Pulmonary/Chest: He has wheezes.  Abdominal: Soft. Bowel sounds are normal. He exhibits no distension. There is no tenderness.  Musculoskeletal: Normal range of motion. He exhibits no edema.  Neurological: He is alert and oriented to person, place, and time.  Skin: Skin is warm and dry. No rash noted. No erythema. No pallor.  Psychiatric: He has a normal mood and affect. His behavior is normal. Judgment and thought content normal.     BP 128/70 mmHg  Pulse 64  Temp(Src) 98 F (36.7 C) (Oral)  Ht 5\' 10"  (1.778 m)  Wt 212 lb (96.163 kg)  BMI 30.42 kg/m2      Assessment & Plan:  1. Acute frontal sinusitis, recurrence not specified -- Take meds as prescribed - Use a cool mist humidifier  -Use saline nose sprays frequently -Saline irrigations of the nose can be very helpful if done frequently.  * 4X daily for 1 week*  * Use of a nettie pot can be helpful with this. Follow directions with this* -Force  fluids -For any cough or congestion  Use plain Mucinex- regular strength or max strength is fine   * Children- consult with Pharmacist for dosing -For fever or aces or pains- take tylenol or ibuprofen appropriate for age and weight.  * for fevers greater than 101 orally you may alternate ibuprofen and  tylenol every  3 hours. -Throat lozenges if help -Pt told to continue with flonase and allerga and not to start antibiotic until Friday if not improved - amoxicillin-clavulanate (AUGMENTIN) 875-125 MG tablet; Take 1 tablet by mouth 2 (two) times daily.  Dispense: 14 tablet; Refill: 0  Jannifer Rodney, FNP

## 2015-10-29 NOTE — Patient Instructions (Signed)
Sinusitis, Adult Sinusitis is redness, soreness, and inflammation of the paranasal sinuses. Paranasal sinuses are air pockets within the bones of your face. They are located beneath your eyes, in the middle of your forehead, and above your eyes. In healthy paranasal sinuses, mucus is able to drain out, and air is able to circulate through them by way of your nose. However, when your paranasal sinuses are inflamed, mucus and air can become trapped. This can allow bacteria and other germs to grow and cause infection. Sinusitis can develop quickly and last only a short time (acute) or continue over a long period (chronic). Sinusitis that lasts for more than 12 weeks is considered chronic. CAUSES Causes of sinusitis include:  Allergies.  Structural abnormalities, such as displacement of the cartilage that separates your nostrils (deviated septum), which can decrease the air flow through your nose and sinuses and affect sinus drainage.  Functional abnormalities, such as when the small hairs (cilia) that line your sinuses and help remove mucus do not work properly or are not present. SIGNS AND SYMPTOMS Symptoms of acute and chronic sinusitis are the same. The primary symptoms are pain and pressure around the affected sinuses. Other symptoms include:  Upper toothache.  Earache.  Headache.  Bad breath.  Decreased sense of smell and taste.  A cough, which worsens when you are lying flat.  Fatigue.  Fever.  Thick drainage from your nose, which often is green and may contain pus (purulent).  Swelling and warmth over the affected sinuses. DIAGNOSIS Your health care provider will perform a physical exam. During your exam, your health care provider may perform any of the following to help determine if you have acute sinusitis or chronic sinusitis:  Look in your nose for signs of abnormal growths in your nostrils (nasal polyps).  Tap over the affected sinus to check for signs of  infection.  View the inside of your sinuses using an imaging device that has a light attached (endoscope). If your health care provider suspects that you have chronic sinusitis, one or more of the following tests may be recommended:  Allergy tests.  Nasal culture. A sample of mucus is taken from your nose, sent to a lab, and screened for bacteria.  Nasal cytology. A sample of mucus is taken from your nose and examined by your health care provider to determine if your sinusitis is related to an allergy. TREATMENT Most cases of acute sinusitis are related to a viral infection and will resolve on their own within 10 days. Sometimes, medicines are prescribed to help relieve symptoms of both acute and chronic sinusitis. These may include pain medicines, decongestants, nasal steroid sprays, or saline sprays. However, for sinusitis related to a bacterial infection, your health care provider will prescribe antibiotic medicines. These are medicines that will help kill the bacteria causing the infection. Rarely, sinusitis is caused by a fungal infection. In these cases, your health care provider will prescribe antifungal medicine. For some cases of chronic sinusitis, surgery is needed. Generally, these are cases in which sinusitis recurs more than 3 times per year, despite other treatments. HOME CARE INSTRUCTIONS  Drink plenty of water. Water helps thin the mucus so your sinuses can drain more easily.  Use a humidifier.  Inhale steam 3-4 times a day (for example, sit in the bathroom with the shower running).  Apply a warm, moist washcloth to your face 3-4 times a day, or as directed by your health care provider.  Use saline nasal sprays to help   moisten and clean your sinuses.  Take medicines only as directed by your health care provider.  If you were prescribed either an antibiotic or antifungal medicine, finish it all even if you start to feel better. SEEK IMMEDIATE MEDICAL CARE IF:  You have  increasing pain or severe headaches.  You have nausea, vomiting, or drowsiness.  You have swelling around your face.  You have vision problems.  You have a stiff neck.  You have difficulty breathing.   This information is not intended to replace advice given to you by your health care provider. Make sure you discuss any questions you have with your health care provider.   Document Released: 06/22/2005 Document Revised: 07/13/2014 Document Reviewed: 07/07/2011 Else[employer] Interactive Patient Education 2016 [employer]  - Take meds as prescribed - Use a cool mist humidifier  -Use saline nose sprays frequently -Saline irrigations of the nose can be very helpful if done frequently.  * 4X daily for 1 week*  * Use of a nettie pot can be helpful with this. Follow directions with this* -Force fluids -For any cough or congestion  Use plain Mucinex- regular strength or max strength is fine   * Children- consult with Pharmacist for dosing -For fever or aces or pains- take tylenol or ibuprofen appropriate for age and weight.  * for fevers greater than 101 orally you may alternate ibuprofen and tylenol every  3 hours. -Throat lozenges if help   Annisa Mazzarella, FNP   

## 2015-11-01 ENCOUNTER — Ambulatory Visit (INDEPENDENT_AMBULATORY_CARE_PROVIDER_SITE_OTHER): Payer: BLUE CROSS/BLUE SHIELD | Admitting: Cardiology

## 2015-11-01 ENCOUNTER — Encounter: Payer: Self-pay | Admitting: Cardiology

## 2015-11-01 VITALS — BP 121/72 | HR 54 | Ht 70.0 in | Wt 197.0 lb

## 2015-11-01 DIAGNOSIS — I10 Essential (primary) hypertension: Secondary | ICD-10-CM | POA: Diagnosis not present

## 2015-11-01 DIAGNOSIS — G4733 Obstructive sleep apnea (adult) (pediatric): Secondary | ICD-10-CM

## 2015-11-01 DIAGNOSIS — I359 Nonrheumatic aortic valve disorder, unspecified: Secondary | ICD-10-CM

## 2015-11-01 DIAGNOSIS — E785 Hyperlipidemia, unspecified: Secondary | ICD-10-CM | POA: Diagnosis not present

## 2015-11-01 DIAGNOSIS — I712 Thoracic aortic aneurysm, without rupture: Secondary | ICD-10-CM | POA: Diagnosis not present

## 2015-11-01 DIAGNOSIS — I7121 Aneurysm of the ascending aorta, without rupture: Secondary | ICD-10-CM

## 2015-11-01 NOTE — Progress Notes (Signed)
Patient ID: [name] LabradorJames M [name], male   DOB: August 02, 1955, 60 y.o.   MRN: [id number]     Clinical Summary Mr. [name] [name] is a 60 y.o.male seen today for follow up of the following medical problems.   1. AVR - history of bicuspid AV, s/p bioprosthetic AVR in 2012  - reports some occassional DOE walking inclines which stable. No chest pain, no lightheadness or dizziness since last visit. .   2. Ascending aortic aneurysm - mild aortic root dilatation at 4.1 to 4.2 cm by last MRI 10/2013, likely related to hx of bicuspid AV and potential coexisting aortopathy - CTA 10/2014 4.1 cm ascending aortic aneurysm.  - denies any chest pain symptoms since last visit.   3. HTN - on higher dose of lisinopril had symptomatic low bp's, back down to 20mg  daily by pcp.  - checks at home occasionally, typically 140/70s. Checks first thing in the mornings prior to meds  4. OSA - not compliant with CPAP due to discomfort. Has not been intrested in retyring  5. Hyperlipidemia - compliant with lipitor - Last panel Jan 2017 TC LDL 57 HDL 28 TG 101   6. DM2 - followed by pcp Past Medical History  Diagnosis Date  . OSA on CPAP 2006  . NIDDM (non-insulin dependent diabetes mellitus)     x15 yrs  . Diastolic dysfunction 8/09  . Aortic stenosis 8/09    Bicuspid aortic valve  . CTS (carpal tunnel syndrome)     bilateral   . Hypertension   . Sinusitis   . Nasal congestion   . Hyperlipidemia   . Vitamin D deficiency 06/13/09  . Arthritis 6/10    hand   . Kidney stones   . Paresthesias in left hand 02/19/2004  . Paresthesias in right hand 02/19/2004  . CAD (coronary artery disease) 8/09  . Atrial fibrillation (HCC)     post op only  . Elevated LFTs   . Fatty liver disease, nonalcoholic      Allergies  Allergen Reactions  . Hctz [Hydrochlorothiazide]     Nausea and headache   . Sulfa Antibiotics   . Sulfonamide Derivatives      Current Outpatient Prescriptions  Medication Sig Dispense Refill  .  acetaminophen (TYLENOL) 500 MG tablet Take 500 mg by mouth every 6 (six) hours as needed.    [name]. amoxicillin-clavulanate (AUGMENTIN) 875-125 MG tablet Take 1 tablet by mouth 2 (two) times daily. 14 tablet 0  . aspirin (SB LOW DOSE ASA EC) 81 MG EC tablet Take 81 mg by mouth daily.      [name]. atenolol (TENORMIN) 25 MG tablet Take 1 tablet (25 mg total) by mouth daily. 180 tablet 3  . atorvastatin (LIPITOR) 40 MG tablet TAKE 1 TABLET DAILY 90 tablet 0  . cholecalciferol (VITAMIN D) 1000 UNITS tablet Take 2,000 Units by mouth daily.     . fluticasone (FLONASE) 50 MCG/ACT nasal spray Place 2 sprays into the nose daily as needed.    [name]. glipiZIDE (GLUCOTROL) 5 MG tablet TAKE 1 TABLET TWICE A DAY BEFORE A MEAL (DISCONTINUE NATEGLINIDE/STARLIX) 180 tablet 0  . guaiFENesin (MUCINEX) 600 MG 12 hr tablet Take by mouth 2 (two) times daily.    [name]. JARDIANCE 25 MG TABS tablet TAKE 1 TABLET BY MOUTH EVERY DAY 30 tablet 1  . lisinopril (PRINIVIL,ZESTRIL) 20 MG tablet TAKE 1 TABLET DAILY 90 tablet 1  . metFORMIN (GLUCOPHAGE) 1000 MG tablet TAKE 1 TABLET TWICE DAILY WITH MEALS 180 tablet 0  .  promethazine (PHENERGAN) 25 MG tablet Take 1 tablet (25 mg total) by mouth every 4 (four) hours as needed. 30 tablet 2  . ranitidine (ZANTAC) 150 MG tablet Take 150 mg by mouth at bedtime.    . temazepam (RESTORIL) 15 MG capsule Take 1 capsule (15 mg total) by mouth at bedtime as needed. 30 capsule 3  . Testosterone (ANDROGEL PUMP) 20.25 MG/ACT (1.62%) GEL APPLY 3 PUMPS TOPICALLY EVERY DAY 75 g 3   No current facility-administered medications for this visit.     Past Surgical History  Procedure Laterality Date  . Coronary angioplasty with stent placement      to rt coronary atery (Dr. Degert-cardiologist)  . Tissue aortic valve replacement      2012  . Carpal tunnel release      bilateral      Allergies  Allergen Reactions  . Hctz [Hydrochlorothiazide]     Nausea and headache   . Sulfa Antibiotics   . Sulfonamide  Derivatives       Family History  Problem Relation Age of Onset  . Diabetes      Family History   . Hearing loss Mother   . Alzheimer's disease Mother   . Hearing loss Father   . Diabetes Father      Social History Mr. Holsomback reports that he quit smoking about 13 years ago. His smoking use included Cigarettes. He has a 37.5 pack-year smoking history. He has never used smokeless tobacco. Mr. Smick reports that he does not drink alcohol.   Review of Systems CONSTITUTIONAL: No weight loss, fever, chills, weakness or fatigue.  HEENT: Eyes: No visual loss, blurred vision, double vision or yellow sclerae.No hearing loss, sneezing, congestion, runny nose or sore throat.  SKIN: No rash or itching.  CARDIOVASCULAR: per HPI RESPIRATORY: No shortness of breath, cough or sputum.  GASTROINTESTINAL: No anorexia, nausea, vomiting or diarrhea. No abdominal pain or blood.  GENITOURINARY: No burning on urination, no polyuria NEUROLOGICAL: No headache, dizziness, syncope, paralysis, ataxia, numbness or tingling in the extremities. No change in bowel or bladder control.  MUSCULOSKELETAL: No muscle, back pain, joint pain or stiffness.  LYMPHATICS: No enlarged nodes. No history of splenectomy.  PSYCHIATRIC: No history of depression or anxiety.  ENDOCRINOLOGIC: No reports of sweating, cold or heat intolerance. No polyuria or polydipsia.  [name]   Physical Examination Filed Vitals:   11/01/15 0816  BP: 121/72  Pulse: 54   Filed Vitals:   11/01/15 0816  Height: 5\' 10"  (1.778 m)  Weight: 197 lb (89.359 kg)    Gen: resting comfortably, no acute distress HEENT: no scleral icterus, pupils equal round and reactive, no palptable cervical adenopathy,  CV: RRR, no m/r/g, no jvd Resp: Clear to auscultation bilaterally GI: abdomen is soft, non-tender, non-distended, normal bowel sounds, no hepatosplenomegaly MSK: extremities are warm, no edema.  Skin: warm, no rash Neuro:  no focal deficits Psych:  appropriate affect   Diagnostic Studies 09/2012 MRI/MRA Chest Stable 4 cm ascending thoracic aneurysm  08/2010 Cath HEMODYNAMIC DATA: Right atrial pressures 7/5 with a mean of 4 mmHg,  right ventricular pressure is 24 with an EDP of 7 mmHg. Pulmonary  artery pressure is 20/7 with a mean of 13 mmHg. Pulmonary capillary  wedge pressure is 10/7 with a mean of 6 mmHg. Aortic pressure is 118/67  with a mean of 88 mmHg. Left ventricular pressure is 161 with an EDP of  12 mmHg. There is no significant mitral valve gradient. The aortic  valve  mean gradient is 31 mmHg with calculated valve area of 0.8  centimeter squared. Thermodilution cardiac output is 4.4 L per minute  with an index of 2, Fick cardiac outputs 4.7 L per minute with an index  of 2.2.  ANGIOGRAPHIC DATA: The left ventricular angiography was performed in a  RAO view. This demonstrates normal left ventricular size and  contractility with normal systolic function. Ejection fraction is  estimated at 65%. The aortic valve is calcified with eccentric opening  and restrictive mobility.  The left coronary artery arises and distributes normally. The left main  coronary has 20% narrowing in the ostium and in the distal left main.  The left anterior descending artery is a tortuous vessel which has less  than 10% irregularities.  Left circumflex coronary artery is rise to first obtuse marginal vessel  which has a 40-50% stenosis proximally.  The right coronary artery is a dominant vessel, it has 20% disease in  the distal vessel. The stent in the midvessel was widely patent.  FINAL INTERPRETATION:  1. Nonobstructive atherosclerotic coronary artery disease.  2. Normal left ventricular function.  3. Severe aortic stenosis.  4. Normal right heart pressures.  PLAN: Proceed with aortic valve replacement.  10/2013 MRA Chest EXAM: MRA CHEST WITH OR WITHOUT CONTRAST  TECHNIQUE: Angiographic images of the chest  were obtained using MRA technique without and with intravenous contrast.  CONTRAST: 20mL MULTIHANCE GADOBENATE DIMEGLUMINE 529 MG/ML IV SOLN  COMPARISON: CT UROGRAM dated 05/08/2011; MR MRA CHEST W/ OR W/O CM dated 10/07/2012; CT ANGIO CHEST dated 09/08/2010  FINDINGS: There is stable aneurysmal dilatation of the aortic root that does not involve the sinuses of Valsalva. No aortic dissection is present. Artifact is noted from a prosthetic aortic valve. Diameter at the level of the sinuses of Valsalva is approximately 3.2 cm. Maximal caliber of the ascending thoracic aorta is 4.1- 4.2 cm. The proximal arch measures 3.8 cm. The distal arch measures 2.6 cm. The descending thoracic aorta measures 2.6 cm. Proximal great vessels show stable and normal patency without anatomical variant.  The heart size is normal. No pleural or pericardial fluid is identified. No masses or lymphadenopathy are seen. Visualized spine shows no abnormalities.  IMPRESSION: Stable aneurysmal dilatation of the ascending thoracic aorta measuring 4.1- 4.2 cm in greatest diameter. No dissection is identified.  10/2014 CTA Chest IMPRESSION: 1. Mild fusiform aneurysmal dilatation of the ascending thoracic aorta measuring approximately 41 mm in maximal diameter, stable since the 2012 examination. 2. Stable sequela of prior median sternotomy and aortic valve replacement. 3. Coronary artery calcifications. 4. Punctate (approximately 5 mm) in determine right lower lobe pulmonary nodule, not definitely seen on the 20/2012 examination. If the patient is at high risk for bronchogenic carcinoma, follow-up chest CT at 6-12 months is recommended. If the patient is at low risk for bronchogenic carcinoma, follow-up chest CT at 12 months is recommended. This recommendation follows the consensus statement:  4//28/17 Clinic EKG (performed and reviewed in clinic) : NSR Assessment and Plan  1. Aortic valve replacement - hx  of bicuspid AV, s/p bioprosthetic AVR in 2012 - denies any current symptoms, we will continue to monitor  2. Ascending aortic root aneurysm - will repeat CTA, has been stable in size.   3. HTN - above goal by home numbers, in clinic today after taking meds he is at goal. Continue to monitor. Counseled to check bp at home at least 1 hour after taking meds  4. OSA - he is not  interested in being reevaluated for CPAP  5. Hyperlipidemia - at goal, we will continue current statin    F/u 1 year   Antoine Poche, M.D.

## 2015-11-01 NOTE — Patient Instructions (Signed)
Your physician wants you to follow-up in: 1 YEAR WITH DR. BRANCH You will receive a reminder letter in the mail two months in advance. If you don't receive a letter, please call our office to schedule the follow-up appointment.  Your physician recommends that you continue on your current medications as directed. Please refer to the Current Medication list given to you today.  Non-Cardiac CT Angiography (CTA), is a special type of CT scan that uses a computer to produce multi-dimensional views of major blood vessels throughout the body. In CT angiography, a contrast material is injected through an IV to help visualize the blood vessels  Thank you for choosing [facility]!!

## 2015-11-03 ENCOUNTER — Other Ambulatory Visit: Payer: Self-pay | Admitting: Family Medicine

## 2015-11-04 ENCOUNTER — Telehealth: Payer: Self-pay | Admitting: Cardiology

## 2015-11-04 DIAGNOSIS — I10 Essential (primary) hypertension: Secondary | ICD-10-CM | POA: Diagnosis not present

## 2015-11-04 NOTE — Telephone Encounter (Signed)
Auth # [id number] exp 12/03/15

## 2015-11-04 NOTE — Telephone Encounter (Signed)
CT Angio Chest aorta W/CM & OR scheduled for 11/06/15 at [facility]  Checking percert

## 2015-11-06 ENCOUNTER — Ambulatory Visit (HOSPITAL_COMMUNITY)
Admission: RE | Admit: 2015-11-06 | Discharge: 2015-11-06 | Disposition: A | Discharge status: Home / Self Care | Payer: BLUE CROSS/BLUE SHIELD | Source: Ambulatory Visit | Attending: Cardiology | Admitting: Cardiology

## 2015-11-06 DIAGNOSIS — I712 Thoracic aortic aneurysm, without rupture: Secondary | ICD-10-CM | POA: Insufficient documentation

## 2015-11-06 DIAGNOSIS — I7121 Aneurysm of the ascending aorta, without rupture: Secondary | ICD-10-CM

## 2015-11-06 LAB — POCT I-STAT CREATININE: Creatinine, Ser: 0.8 mg/dL (ref 0.61–1.24)

## 2015-11-06 MED ORDER — IOPAMIDOL (ISOVUE-370) INJECTION 76%
100.0000 mL | Freq: Once | INTRAVENOUS | Status: AC | PRN
  Administered 2015-11-06: 100 mL via INTRAVENOUS

## 2015-11-07 ENCOUNTER — Telehealth: Payer: Self-pay | Admitting: *Deleted

## 2015-11-07 NOTE — Telephone Encounter (Signed)
Pt aware, routed to pcp 

## 2015-11-07 NOTE — Telephone Encounter (Signed)
-----   Message from Antoine PocheJonathan F Branch, MD sent at 11/06/2015  1:04 PM EDT ----- CT shows that aneurysm remains small and is unchanged from last check. We will continue to monitor. There are small lung nodules that are unchanged in size, there small size and lack of change support benign cause, continue to monitor.  Dominga FerryJ Branch MD

## 2015-11-25 ENCOUNTER — Other Ambulatory Visit: Payer: Self-pay | Admitting: Family Medicine

## 2015-12-03 ENCOUNTER — Other Ambulatory Visit: Payer: Self-pay | Admitting: Family Medicine

## 2015-12-03 NOTE — Telephone Encounter (Signed)
Patient last seen February 2017, last A1C January 2017

## 2015-12-04 ENCOUNTER — Encounter: Payer: Self-pay | Admitting: Family Medicine

## 2015-12-04 ENCOUNTER — Ambulatory Visit (INDEPENDENT_AMBULATORY_CARE_PROVIDER_SITE_OTHER): Payer: BLUE CROSS/BLUE SHIELD | Admitting: Family Medicine

## 2015-12-04 VITALS — BP 115/67 | HR 63 | Temp 98.1°F | Ht 70.0 in | Wt 209.0 lb

## 2015-12-04 DIAGNOSIS — E1169 Type 2 diabetes mellitus with other specified complication: Secondary | ICD-10-CM | POA: Diagnosis not present

## 2015-12-04 DIAGNOSIS — E785 Hyperlipidemia, unspecified: Secondary | ICD-10-CM | POA: Diagnosis not present

## 2015-12-04 DIAGNOSIS — R918 Other nonspecific abnormal finding of lung field: Secondary | ICD-10-CM

## 2015-12-04 DIAGNOSIS — E559 Vitamin D deficiency, unspecified: Secondary | ICD-10-CM | POA: Diagnosis not present

## 2015-12-04 DIAGNOSIS — K760 Fatty (change of) liver, not elsewhere classified: Secondary | ICD-10-CM | POA: Diagnosis not present

## 2015-12-04 DIAGNOSIS — IMO0001 Reserved for inherently not codable concepts without codable children: Secondary | ICD-10-CM

## 2015-12-04 DIAGNOSIS — E1165 Type 2 diabetes mellitus with hyperglycemia: Secondary | ICD-10-CM

## 2015-12-04 DIAGNOSIS — I10 Essential (primary) hypertension: Secondary | ICD-10-CM

## 2015-12-04 DIAGNOSIS — N400 Enlarged prostate without lower urinary tract symptoms: Secondary | ICD-10-CM

## 2015-12-04 DIAGNOSIS — H9193 Unspecified hearing loss, bilateral: Secondary | ICD-10-CM

## 2015-12-04 DIAGNOSIS — K219 Gastro-esophageal reflux disease without esophagitis: Secondary | ICD-10-CM | POA: Diagnosis not present

## 2015-12-04 DIAGNOSIS — E119 Type 2 diabetes mellitus without complications: Secondary | ICD-10-CM | POA: Insufficient documentation

## 2015-12-04 DIAGNOSIS — Z1211 Encounter for screening for malignant neoplasm of colon: Secondary | ICD-10-CM

## 2015-12-04 DIAGNOSIS — E291 Testicular hypofunction: Secondary | ICD-10-CM

## 2015-12-04 DIAGNOSIS — I712 Thoracic aortic aneurysm, without rupture: Secondary | ICD-10-CM

## 2015-12-04 DIAGNOSIS — R140 Abdominal distension (gaseous): Secondary | ICD-10-CM

## 2015-12-04 DIAGNOSIS — I7121 Aneurysm of the ascending aorta, without rupture: Secondary | ICD-10-CM

## 2015-12-04 DIAGNOSIS — E349 Endocrine disorder, unspecified: Secondary | ICD-10-CM

## 2015-12-04 LAB — URINALYSIS, COMPLETE
Bilirubin, UA: NEGATIVE
Ketones, UA: NEGATIVE
LEUKOCYTES UA: NEGATIVE
Nitrite, UA: NEGATIVE
PH UA: 5 (ref 5.0–7.5)
Protein, UA: NEGATIVE
SPEC GRAV UA: 1.02 (ref 1.005–1.030)
Urobilinogen, Ur: 0.2 mg/dL (ref 0.2–1.0)

## 2015-12-04 LAB — MICROSCOPIC EXAMINATION
BACTERIA UA: NONE SEEN
EPITHELIAL CELLS (NON RENAL): NONE SEEN /HPF (ref 0–10)
WBC, UA: NONE SEEN /hpf (ref 0–?)

## 2015-12-04 LAB — BAYER DCA HB A1C WAIVED: HB A1C (BAYER DCA - WAIVED): 7.8 % — ABNORMAL HIGH (ref <7.0)

## 2015-12-04 NOTE — Progress Notes (Signed)
Subjective:    Patient ID: [name] [name], male    DOB: June 11, 1956, 60 y.o.   MRN: [id number]  HPI Pt here for follow up and management of chronic medical problems which includes diabetes, hypertension and hyperlipidemia. He is taking medications regularly.Patient is doing well today with no specific complaints. He does not need any refills. He is due to get a rectal exam today and a PSA in addition to his lab work. He will also be given an FOBT to return. The patient is doing well overall. He denies any chest pain chest tightness or shortness of breath. He saw the cardiologist last month and got a good report from him and we'll continue to see him yearly. He denies any trouble with his GI tract other than heartburn and reflux and bloating. His bowel movements have consistently been stable and have not changed in nature. He has not seen any blood in the stool or had any black tarry bowel movements. He is not do another colonoscopy until 2022. He's passing his water without problems and denies any blood in the urine or the stool. The patient says that his blood sugars have been doing much better since the Jardiance was added. Using running in the 120-140 range. Some of the improved blood sugar readings may also be a result of his being more physically active.     Patient Active Problem List   Diagnosis Date Noted  . Sinus congestion 02/18/2015  . GERD (gastroesophageal reflux disease) 03/08/2014  . NAFLD (nonalcoholic fatty liver disease) 03/08/2014  . Elevated liver enzymes 03/08/2014  . Ascending aortic aneurysm ([place]) 12/12/2012  . INSOMNIA 09/15/2010  . ATRIAL FIBRILLATION, PAROXYSMAL, HX OF 09/15/2010  . CAD (coronary artery disease) 07/09/2009  . AORTIC VALVE DISORDERS 05/27/2009  . OSTEOARTHRITIS, LOCALIZED 05/27/2009  . Diabetes mellitus type 2, uncontrolled, without complications ([place]) 69/45/0388  . Hyperlipidemia with target LDL less than 70 05/24/2009  . Hypokalemia 05/24/2009  .  HYPERTENSION 05/24/2009  . UNSPECIFIED SLEEP APNEA 05/24/2009   Outpatient Encounter Prescriptions as of 12/04/2015  Medication Sig  . aspirin (SB LOW DOSE ASA EC) 81 MG EC tablet Take 81 mg by mouth daily.    [name] atenolol (TENORMIN) 25 MG tablet Take 1 tablet (25 mg total) by mouth daily.  [name] atorvastatin (LIPITOR) 40 MG tablet TAKE 1 TABLET DAILY  . cholecalciferol (VITAMIN D) 1000 UNITS tablet Take 2,000 Units by mouth daily.   . fluticasone (FLONASE) 50 MCG/ACT nasal spray Place 2 sprays into the nose daily as needed.  [name] glipiZIDE (GLUCOTROL) 5 MG tablet TAKE 1 TABLET TWICE A DAY BEFORE A MEAL (DISCONTINUE NATEGLINIDE/STARLIX)  . guaiFENesin (MUCINEX) 600 MG 12 hr tablet Take by mouth 2 (two) times daily.  [name] JARDIANCE 25 MG TABS tablet TAKE 1 TABLET BY MOUTH EVERY DAY  . lisinopril (PRINIVIL,ZESTRIL) 20 MG tablet TAKE 1 TABLET DAILY  . metFORMIN (GLUCOPHAGE) 1000 MG tablet TAKE 1 TABLET TWICE DAILY WITH MEALS  . promethazine (PHENERGAN) 25 MG tablet Take 1 tablet (25 mg total) by mouth every 4 (four) hours as needed.  . ranitidine (ZANTAC) 150 MG tablet Take 150 mg by mouth 2 (two) times daily.   . temazepam (RESTORIL) 15 MG capsule Take 1 capsule (15 mg total) by mouth at bedtime as needed.  . Testosterone (ANDROGEL PUMP) 20.25 MG/ACT (1.62%) GEL APPLY 3 PUMPS TOPICALLY EVERY DAY  . [DISCONTINUED] amoxicillin-clavulanate (AUGMENTIN) 875-125 MG tablet Take 1 tablet by mouth 2 (two) times daily.   No  facility-administered encounter medications on file as of 12/04/2015.      Review of Systems  Constitutional: Negative.   HENT: Negative.   Eyes: Negative.   Respiratory: Negative.   Cardiovascular: Negative.   Gastrointestinal: Negative.   Endocrine: Negative.   Genitourinary: Negative.   Musculoskeletal: Negative.   Skin: Negative.   Allergic/Immunologic: Negative.   Neurological: Negative.   Hematological: Negative.   Psychiatric/Behavioral: Negative.        Objective:    Physical Exam  Constitutional: He is oriented to person, place, and time. He appears well-developed and well-nourished. No distress.  HENT:  Head: Normocephalic and atraumatic.  Right Ear: External ear normal.  Left Ear: External ear normal.  Nose: Nose normal.  Mouth/Throat: Oropharynx is clear and moist. No oropharyngeal exudate.  The patient does have some hearing loss. No carotid bruits were audible  Eyes: Conjunctivae and EOM are normal. Pupils are equal, round, and reactive to light. Right eye exhibits no discharge. Left eye exhibits no discharge. No scleral icterus.  He gets his eyes examined yearly and August  Neck: Normal range of motion. Neck supple. No thyromegaly present.  Cardiovascular: Normal rate, regular rhythm, normal heart sounds and intact distal pulses.   No murmur heard. The heart has a regular rate and rhythm at 60/m  Pulmonary/Chest: Effort normal and breath sounds normal. No respiratory distress. He has no wheezes. He has no rales. He exhibits no tenderness.  Clear anteriorly and posteriorly and no axillary adenopathy  Abdominal: Soft. Bowel sounds are normal. He exhibits no mass. There is no tenderness. There is no rebound and no guarding.  No abdominal tenderness masses or organ enlargement  Genitourinary: Rectum normal and penis normal.  Prostate was slightly enlarged without lumps or masses. There were no rectal masses. There are no inguinal hernias palpable. External genitalia were within normal limits.  Musculoskeletal: Normal range of motion. He exhibits no edema or tenderness.  Lymphadenopathy:    He has no cervical adenopathy.  Neurological: He is alert and oriented to person, place, and time. He has normal reflexes. No cranial nerve deficit.  Skin: Skin is warm and dry. No rash noted.  Psychiatric: He has a normal mood and affect. His behavior is normal. Judgment and thought content normal.  Nursing note and vitals reviewed.  .BP 115/67 mmHg  Pulse 63   Temp(Src) 98.1 F (36.7 C) (Oral)  Ht _0  (1.778 m)  Wt 209 lb (94.802 kg)  BMI 29.99 kg/m2        Assessment & Plan:  1. Uncontrolled type 2 diabetes mellitus without complication, without long-term current use of insulin ([place]) -Home blood sugar readings have been improved with the addition of the Jardiance. He did not bring readings in with him to the visit today. - BMP8+EGFR - CBC with Differential/Platelet - Bayer DCA Hb A1c Waived  2. Essential hypertension -The blood pressure is good today and he will continue with current treatment - BMP8+EGFR - CBC with Differential/Platelet - Hepatic function panel  3. Hyperlipidemia associated with type 2 diabetes mellitus ([place]) -He will continue with current treatment pending results of lab work - CBC with Differential/Platelet - NMR, lipoprofile  4. Testosterone deficiency - Urinalysis, Complete - CBC with Differential/Platelet - PSA, total and free - Testosterone,Free and Total  5. Vitamin D deficiency -Continue with current treatment pending results of lab work - CBC with Differential/Platelet - VITAMIN D 25 Hydroxy (Vit-D Deficiency, Fractures)  6. BPH (benign prostatic hyperplasia) -The prostate remains enlarged without  lumps or masses and the patient has no specific complaints with this. - Urinalysis, Complete - CBC with Differential/Platelet - PSA, total and free - Testosterone,Free and Total  7. Special screening for malignant neoplasms, colon - Fecal occult blood, imunochemical; Future  8. NAFLD (nonalcoholic fatty liver disease) -He is had elevated liver function tests in the past and we will continue to monitor this with the blood work today.  9. Gastroesophageal reflux disease, esophagitis presence not specified -He continues to have some reflux disease and heartburn. He is only taking ranitidine 150 twice daily before meals. We discussed that he may want to try increasing this to 300 mg twice daily to  see if he gets a better response and less reflux symptoms.  10. Hyperlipidemia with target LDL less than 70 -Continue with current treatment and aggressive therapeutic lifestyle changes  11. Type 2 diabetes mellitus with hyperlipidemia (HCC) -Continue with current treatment and therapeutic lifestyle changes  12. Ascending aortic aneurysm ([place]) -Continue with yearly CT follow-ups of aneurysm  13. Multiple lung nodules on CT -Continue with yearly CT follow-ups  14. Abdominal bloating -Try align, the probiotic as directed  15. Hearing deficit, bilateral -Consider being evaluated for hearing loss and getting hearing aids.  No orders of the defined types were placed in this encounter.   Patient Instructions  Continue current medications. Continue good therapeutic lifestyle changes which include good diet and exercise. Fall precautions discussed with patient. If an FOBT was given today- please return it to our front desk. If you are over 4 years old - you may need Prevnar 56 or the adult Pneumonia vaccine.  After your visit with [place] today you will receive a survey in the mail or online from [employer] regarding your care with [place]. Please take a moment to fill this out. Your feedback is very important to [place] as you can help [place] better understand your patient needs as well as improve your experience and satisfaction. WE CARE ABOUT YOU!!!   Continue to follow-up with cardiology and get yearly CT scans for ascending aortic aneurysm and pulmonary nodules Stay active physically and continue to monitor blood sugars closely Try the probiotic as mentioned to see if this helps with the bloating If you continue to have heartburn and reflux, consider trying or increasing ranitidine to 300 mg twice daily to see if this helps Continue to avoid foods that can be irritating to your stomach like caffeine and fried foods.   Arrie Senate MD

## 2015-12-04 NOTE — Patient Instructions (Addendum)
Continue current medications. Continue good therapeutic lifestyle changes which include good diet and exercise. Fall precautions discussed with patient. If an FOBT was given today- please return it to our front desk. If you are over 61 years old - you may need Prevnar 13 or the adult Pneumonia vaccine.  After your visit with us today you will receive a survey in the mail or online from [employer] regarding your care with us. Please take a moment to fill this out. Your feedback is very important to us as you can help us better understand your patient needs as well as improve your experience and satisfaction. WE CARE ABOUT YOU!!!   Continue to follow-up with cardiology and get yearly CT scans for ascending aortic aneurysm and pulmonary nodules Stay active physically and continue to monitor blood sugars closely Try the probiotic as mentioned to see if this helps with the bloating If you continue to have heartburn and reflux, consider trying or increasing ranitidine to 300 mg twice daily to see if this helps Continue to avoid foods that can be irritating to your stomach like caffeine and fried foods.

## 2015-12-05 LAB — NMR, LIPOPROFILE
Cholesterol: 129 mg/dL (ref 100–199)
HDL Cholesterol by NMR: 32 mg/dL — ABNORMAL LOW (ref >39)
HDL Particle Number: 26.4 umol/L — ABNORMAL LOW (ref >=30.5)
LDL PARTICLE NUMBER: 1288 nmol/L — AB (ref <1000)
LDL SIZE: 19.6 nm (ref >20.5)
LDL-C: 71 mg/dL (ref 0–99)
LP-IR Score: 76 — ABNORMAL HIGH (ref <=45)
SMALL LDL PARTICLE NUMBER: 1085 nmol/L — AB (ref <=527)
Triglycerides by NMR: 128 mg/dL (ref 0–149)

## 2015-12-05 LAB — CBC WITH DIFFERENTIAL/PLATELET
BASOS: 1 %
Basophils Absolute: 0 10*3/uL (ref 0.0–0.2)
EOS (ABSOLUTE): 0.2 10*3/uL (ref 0.0–0.4)
Eos: 3 %
HEMOGLOBIN: 15.1 g/dL (ref 12.6–17.7)
Hematocrit: 45 % (ref 37.5–51.0)
IMMATURE GRANS (ABS): 0 10*3/uL (ref 0.0–0.1)
Immature Granulocytes: 0 %
LYMPHS ABS: 1.5 10*3/uL (ref 0.7–3.1)
LYMPHS: 23 %
MCH: 32.3 pg (ref 26.6–33.0)
MCHC: 33.6 g/dL (ref 31.5–35.7)
MCV: 96 fL (ref 79–97)
Monocytes Absolute: 0.5 10*3/uL (ref 0.1–0.9)
Monocytes: 7 %
NEUTROS ABS: 4.4 10*3/uL (ref 1.4–7.0)
Neutrophils: 66 %
Platelets: 237 10*3/uL (ref 150–379)
RBC: 4.67 x10E6/uL (ref 4.14–5.80)
RDW: 13.5 % (ref 12.3–15.4)
WBC: 6.6 10*3/uL (ref 3.4–10.8)

## 2015-12-05 LAB — HEPATIC FUNCTION PANEL
ALT: 44 IU/L (ref 0–44)
AST: 42 IU/L — AB (ref 0–40)
Albumin: 4.9 g/dL — ABNORMAL HIGH (ref 3.6–4.8)
Alkaline Phosphatase: 74 IU/L (ref 39–117)
BILIRUBIN TOTAL: 0.6 mg/dL (ref 0.0–1.2)
BILIRUBIN, DIRECT: 0.16 mg/dL (ref 0.00–0.40)
Total Protein: 7.7 g/dL (ref 6.0–8.5)

## 2015-12-05 LAB — BMP8+EGFR
BUN / CREAT RATIO: 16 (ref 10–24)
BUN: 15 mg/dL (ref 8–27)
CALCIUM: 9.6 mg/dL (ref 8.6–10.2)
CHLORIDE: 102 mmol/L (ref 96–106)
CO2: 22 mmol/L (ref 18–29)
Creatinine, Ser: 0.96 mg/dL (ref 0.76–1.27)
GFR, EST AFRICAN AMERICAN: 99 mL/min/{1.73_m2} (ref >59)
GFR, EST NON AFRICAN AMERICAN: 86 mL/min/{1.73_m2} (ref >59)
Glucose: 156 mg/dL — ABNORMAL HIGH (ref 65–99)
POTASSIUM: 4.3 mmol/L (ref 3.5–5.2)
Sodium: 143 mmol/L (ref 134–144)

## 2015-12-05 LAB — PSA, TOTAL AND FREE
PROSTATE SPECIFIC AG, SERUM: 0.9 ng/mL (ref 0.0–4.0)
PSA, Free Pct: 68.9 %
PSA, Free: 0.62 ng/mL

## 2015-12-05 LAB — TESTOSTERONE,FREE AND TOTAL
Testosterone, Free: 6.4 pg/mL — ABNORMAL LOW (ref 6.6–18.1)
Testosterone: 279 ng/dL — ABNORMAL LOW (ref 348–1197)

## 2015-12-05 LAB — VITAMIN D 25 HYDROXY (VIT D DEFICIENCY, FRACTURES): VIT D 25 HYDROXY: 44.6 ng/mL (ref 30.0–100.0)

## 2015-12-06 ENCOUNTER — Other Ambulatory Visit: Payer: Self-pay | Admitting: Family Medicine

## 2015-12-09 NOTE — Telephone Encounter (Signed)
Last seen 12/04/15  DWM  [facility] approved [facility]   [phone]

## 2015-12-13 ENCOUNTER — Other Ambulatory Visit: Payer: BLUE CROSS/BLUE SHIELD

## 2015-12-13 DIAGNOSIS — Z1211 Encounter for screening for malignant neoplasm of colon: Secondary | ICD-10-CM

## 2015-12-14 ENCOUNTER — Other Ambulatory Visit: Payer: Self-pay | Admitting: Cardiology

## 2015-12-17 LAB — FECAL OCCULT BLOOD, IMMUNOCHEMICAL: FECAL OCCULT BLD: NEGATIVE

## 2016-01-15 ENCOUNTER — Other Ambulatory Visit: Payer: Self-pay | Admitting: Urology

## 2016-01-15 ENCOUNTER — Ambulatory Visit (HOSPITAL_COMMUNITY)
Admission: RE | Admit: 2016-01-15 | Discharge: 2016-01-15 | Disposition: A | Discharge status: Home / Self Care | Payer: BLUE CROSS/BLUE SHIELD | Source: Ambulatory Visit | Attending: Urology | Admitting: Urology

## 2016-01-15 DIAGNOSIS — N200 Calculus of kidney: Secondary | ICD-10-CM | POA: Insufficient documentation

## 2016-01-19 ENCOUNTER — Other Ambulatory Visit: Payer: Self-pay | Admitting: Family Medicine

## 2016-01-21 ENCOUNTER — Ambulatory Visit (INDEPENDENT_AMBULATORY_CARE_PROVIDER_SITE_OTHER): Payer: BLUE CROSS/BLUE SHIELD | Admitting: Urology

## 2016-01-21 DIAGNOSIS — N200 Calculus of kidney: Secondary | ICD-10-CM

## 2016-01-27 ENCOUNTER — Encounter: Payer: Self-pay | Admitting: Family Medicine

## 2016-01-27 ENCOUNTER — Ambulatory Visit (INDEPENDENT_AMBULATORY_CARE_PROVIDER_SITE_OTHER): Payer: BLUE CROSS/BLUE SHIELD | Admitting: Family Medicine

## 2016-01-27 VITALS — BP 138/77 | HR 72 | Temp 99.0°F | Ht 70.0 in | Wt 210.6 lb

## 2016-01-27 DIAGNOSIS — R21 Rash and other nonspecific skin eruption: Secondary | ICD-10-CM

## 2016-01-27 NOTE — Progress Notes (Signed)
BP 138/77 (BP Location: Right Arm, Patient Position: Sitting, Cuff Size: Large)   Pulse 72   Temp 99 F (37.2 C) (Oral)   Ht  (1.778 m)   Wt 210 lb 9.6 oz (95.5 kg)   BMI 30.22 kg/m    Subjective:    Patient ID: [name] [name], male    DOB: August 07, 1955, 60 y.o.   MRN: [id number]  HPI: [name] [name] is a 60 y.o. male presenting on 01/27/2016 for Bite to lower left leg (x 4 days, pt states he felt something bite his leg but does not knw what it was, has been taking Doxycycline left over from a tick bite last year)   HPI Rash Patient complains of a rash that is on his left lateral lower leg. He says he was bit by something 3 days ago and then put a bandage over top of it and now he has developed this rash underneath where the bandage was. He denies any fevers or chills or purulent drainage. The rash is pink and pruritic  Relevant past medical, surgical, family and social history reviewed and updated as indicated. Interim medical history since our last visit reviewed. Allergies and medications reviewed and updated.  Review of Systems  Constitutional: Negative for fever.  HENT: Negative for ear discharge and ear pain.   Eyes: Negative for discharge and visual disturbance.  Respiratory: Negative for shortness of breath and wheezing.   Cardiovascular: Negative for chest pain and leg swelling.  Gastrointestinal: Negative for abdominal pain, constipation and diarrhea.  Genitourinary: Negative for difficulty urinating.  Musculoskeletal: Negative for back pain and gait problem.  Skin: Positive for color change and rash.  Neurological: Negative for syncope, light-headedness and headaches.  All other systems reviewed and are negative.   Per HPI unless specifically indicated above     Medication List       Accurate as of 01/27/16  3:25 PM. Always use your most recent med list.          ANDROGEL PUMP 20.25 MG/ACT (1.62%) Gel Generic drug:  Testosterone APPLY 3 PUMPS TO SKIN  ONCE DAILY   atenolol 25 MG tablet Commonly known as:  TENORMIN TAKE 1 TABLET DAILY   atorvastatin 40 MG tablet Commonly known as:  LIPITOR TAKE 1 TABLET DAILY   cholecalciferol 1000 units tablet Commonly known as:  VITAMIN D Take 2,000 Units by mouth daily.   fluticasone 50 MCG/ACT nasal spray Commonly known as:  FLONASE Place 2 sprays into the nose daily as needed.   glipiZIDE 5 MG tablet Commonly known as:  GLUCOTROL TAKE 1 TABLET TWICE A DAY BEFORE A MEAL (DISCONTINUE NATEGLINIDE/STARLIX)   guaiFENesin 600 MG 12 hr tablet Commonly known as:  MUCINEX Take by mouth 2 (two) times daily.   JARDIANCE 25 MG Tabs tablet Generic drug:  empagliflozin TAKE 1 TABLET BY MOUTH EVERY DAY   lisinopril 20 MG tablet Commonly known as:  PRINIVIL,ZESTRIL TAKE 1 TABLET DAILY   metFORMIN 1000 MG tablet Commonly known as:  GLUCOPHAGE TAKE 1 TABLET TWICE DAILY WITH MEALS   promethazine 25 MG tablet Commonly known as:  PHENERGAN Take 1 tablet (25 mg total) by mouth every 4 (four) hours as needed.   ranitidine 150 MG tablet Commonly known as:  ZANTAC Take 150 mg by mouth 2 (two) times daily.   SB LOW DOSE ASA EC 81 MG EC tablet Generic drug:  aspirin Take 81 mg by mouth daily.   temazepam 15 MG capsule Commonly  known as:  RESTORIL Take 1 capsule (15 mg total) by mouth at bedtime as needed.          Objective:    BP 138/77 (BP Location: Right Arm, Patient Position: Sitting, Cuff Size: Large)   Pulse 72   Temp 99 F (37.2 C) (Oral)   Ht 5\' 10"  (1.778 m)   Wt 210 lb 9.6 oz (95.5 kg)   BMI 30.22 kg/m   Wt Readings from Last 3 Encounters:  01/27/16 210 lb 9.6 oz (95.5 kg)  12/04/15 209 lb (94.8 kg)  11/01/15 197 lb (89.4 kg)    Physical Exam  Constitutional: He is oriented to person, place, and time. He appears well-developed and well-nourished. No distress.  Eyes: Conjunctivae and EOM are normal. Pupils are equal, round, and reactive to light. Right eye exhibits no  discharge. No scleral icterus.  Neck: Neck supple. No thyromegaly present.  Cardiovascular: Normal rate, regular rhythm, normal heart sounds and intact distal pulses.   No murmur heard. Pulmonary/Chest: Effort normal and breath sounds normal. No respiratory distress. He has no wheezes.  Musculoskeletal: Normal range of motion. He exhibits no edema.  Lymphadenopathy:    He has no cervical adenopathy.  Neurological: He is alert and oriented to person, place, and time. Coordination normal.  Skin: Skin is warm and dry. Rash (Patient has a rash in the shape of a correct tingle where the bandage was with a small papule in the center from the initial bite. The rash is pink macular and follows the outline of where the bandage was perfectly.) noted. He is not diaphoretic. No erythema.  Psychiatric: He has a normal mood and affect. His behavior is normal.  Nursing note and vitals reviewed.     Assessment & Plan:   Problem List Items Addressed This Visit    None    Visit Diagnoses    Rash and nonspecific skin eruption    -  Primary   Patient has what looks like a reaction to the bandage over type of a bite. Do topical antibiotic and moisturizing lotions, return if worsens       Follow up plan: Return if symptoms worsen or fail to improve.  Counseling provided for all of the vaccine components No orders of the defined types were placed in this encounter.   Arville Care, MD [facility] 01/27/2016, 3:25 PM

## 2016-02-03 ENCOUNTER — Other Ambulatory Visit: Payer: Self-pay | Admitting: Family Medicine

## 2016-02-28 ENCOUNTER — Other Ambulatory Visit: Payer: Self-pay | Admitting: Family Medicine

## 2016-03-10 ENCOUNTER — Other Ambulatory Visit: Payer: Self-pay | Admitting: Family Medicine

## 2016-04-10 ENCOUNTER — Encounter: Payer: Self-pay | Admitting: Family Medicine

## 2016-04-10 ENCOUNTER — Ambulatory Visit (INDEPENDENT_AMBULATORY_CARE_PROVIDER_SITE_OTHER): Payer: BLUE CROSS/BLUE SHIELD | Admitting: Family Medicine

## 2016-04-10 ENCOUNTER — Ambulatory Visit (INDEPENDENT_AMBULATORY_CARE_PROVIDER_SITE_OTHER): Payer: BLUE CROSS/BLUE SHIELD

## 2016-04-10 VITALS — BP 132/71 | HR 69 | Temp 98.1°F | Ht 70.0 in | Wt 208.0 lb

## 2016-04-10 DIAGNOSIS — E1169 Type 2 diabetes mellitus with other specified complication: Secondary | ICD-10-CM

## 2016-04-10 DIAGNOSIS — I10 Essential (primary) hypertension: Secondary | ICD-10-CM | POA: Diagnosis not present

## 2016-04-10 DIAGNOSIS — Z23 Encounter for immunization: Secondary | ICD-10-CM | POA: Diagnosis not present

## 2016-04-10 DIAGNOSIS — M545 Low back pain: Secondary | ICD-10-CM | POA: Diagnosis not present

## 2016-04-10 DIAGNOSIS — E785 Hyperlipidemia, unspecified: Secondary | ICD-10-CM

## 2016-04-10 DIAGNOSIS — E1165 Type 2 diabetes mellitus with hyperglycemia: Secondary | ICD-10-CM | POA: Diagnosis not present

## 2016-04-10 DIAGNOSIS — K219 Gastro-esophageal reflux disease without esophagitis: Secondary | ICD-10-CM | POA: Diagnosis not present

## 2016-04-10 DIAGNOSIS — N400 Enlarged prostate without lower urinary tract symptoms: Secondary | ICD-10-CM | POA: Diagnosis not present

## 2016-04-10 DIAGNOSIS — E559 Vitamin D deficiency, unspecified: Secondary | ICD-10-CM | POA: Diagnosis not present

## 2016-04-10 DIAGNOSIS — IMO0001 Reserved for inherently not codable concepts without codable children: Secondary | ICD-10-CM

## 2016-04-10 DIAGNOSIS — E349 Endocrine disorder, unspecified: Secondary | ICD-10-CM

## 2016-04-10 LAB — BAYER DCA HB A1C WAIVED: HB A1C: 7.5 % — AB (ref <7.0)

## 2016-04-10 NOTE — Progress Notes (Signed)
Subjective:    Patient ID: [name] [name], male    DOB: December 22, 1955, 60 y.o.   MRN: [id number]  HPI  Pt here for follow up and management of chronic medical problems which includes diabetes, hyperlipidemia and hypertension. He is taking medications regularly.This patient has a history of aortic valve replacement and angioplasty. He is doing well overall and complains today mostly of some back pain. He has been moving a lot of furniture. He is due to get a chest x-ray today and lab work today as well as his flu shot. The patient lost his father in July following a heart attack. He survived a heart attack but died later in a nursing facility overnight. He was 89. He also unfortunately had his son to overdose on heroin but the son did survive and is in a rehabilitation facility currently. He has been moving furniture from his dad's house and he discussed the issues with his father felt like this was good for him to do this during the visit. He denies any chest pain or shortness of breath. He denies any problems with his stomach other than those with heartburn as  he has had in the past and does have some occasional indigestion. He has not been taking his ranitidine regularly but will start doing this. He's passing his water without problems. His blood sugars are still running higher than they should.    Patient Active Problem List   Diagnosis Date Noted  . Type 2 diabetes mellitus with hyperlipidemia ([place]) 12/04/2015  . Sinus congestion 02/18/2015  . GERD (gastroesophageal reflux disease) 03/08/2014  . NAFLD (nonalcoholic fatty liver disease) 03/08/2014  . Elevated liver enzymes 03/08/2014  . Ascending aortic aneurysm ([place]) 12/12/2012  . INSOMNIA 09/15/2010  . Atrial fibrillation, chronic ([place]) 09/15/2010  . CAD (coronary artery disease) 07/09/2009  . AORTIC VALVE DISORDERS 05/27/2009  . OSTEOARTHRITIS, LOCALIZED 05/27/2009  . Diabetes mellitus type 2, uncontrolled, without complications ([place])  82/70/7867  . Hyperlipidemia with target LDL less than 70 05/24/2009  . Hypokalemia 05/24/2009  . Essential hypertension 05/24/2009  . UNSPECIFIED SLEEP APNEA 05/24/2009   Outpatient Encounter Prescriptions as of 04/10/2016  Medication Sig  . ANDROGEL PUMP 20.25 MG/ACT (1.62%) GEL APPLY 3 PUMPS TO SKIN ONCE DAILY  . aspirin (SB LOW DOSE ASA EC) 81 MG EC tablet Take 81 mg by mouth daily.    [name] atenolol (TENORMIN) 25 MG tablet TAKE 1 TABLET DAILY  . atorvastatin (LIPITOR) 40 MG tablet TAKE 1 TABLET DAILY  . cholecalciferol (VITAMIN D) 1000 UNITS tablet Take 2,000 Units by mouth daily.   . fluticasone (FLONASE) 50 MCG/ACT nasal spray Place 2 sprays into the nose daily as needed.  [name] glipiZIDE (GLUCOTROL) 5 MG tablet TAKE 1 TABLET TWICE A DAY BEFORE A MEAL (DISCONTINUE NATEGLINIDE/STARLIX)  . guaiFENesin (MUCINEX) 600 MG 12 hr tablet Take by mouth 2 (two) times daily.  [name] JARDIANCE 25 MG TABS tablet TAKE 1 TABLET BY MOUTH EVERY DAY  . lisinopril (PRINIVIL,ZESTRIL) 20 MG tablet TAKE 1 TABLET DAILY  . metFORMIN (GLUCOPHAGE) 1000 MG tablet TAKE 1 TABLET TWICE DAILY WITH MEALS  . promethazine (PHENERGAN) 25 MG tablet TAKE 1 TABLET EVERY 4 HOURS AS NEEDED  . ranitidine (ZANTAC) 150 MG tablet Take 150 mg by mouth 2 (two) times daily.   . temazepam (RESTORIL) 15 MG capsule TAKE 1 TABLET BY MOUTH AT BEDTIME AS NEEDED   No facility-administered encounter medications on file as of 04/10/2016.       Review  of Systems  Constitutional: Negative.   HENT: Negative.   Eyes: Negative.   Respiratory: Negative.   Cardiovascular: Negative.   Gastrointestinal: Negative.   Endocrine: Negative.   Genitourinary: Negative.   Musculoskeletal: Positive for back pain.  Skin: Negative.   Allergic/Immunologic: Negative.   Neurological: Negative.   Hematological: Negative.   Psychiatric/Behavioral: Negative.        Objective:   Physical Exam  Constitutional: He is oriented to person, place, and time. He  appears well-developed and well-nourished. No distress.  The patient is pleasant and alert and dealing with a lot of stress at home with losing his father and his son overdosing on heroin  HENT:  Head: Normocephalic and atraumatic.  Right Ear: External ear normal.  Left Ear: External ear normal.  Nose: Nose normal.  Mouth/Throat: Oropharynx is clear and moist. No oropharyngeal exudate.  Eyes: Conjunctivae and EOM are normal. Pupils are equal, round, and reactive to light. Right eye exhibits no discharge. Left eye exhibits no discharge. No scleral icterus.  Neck: Normal range of motion. Neck supple. No thyromegaly present.  No thyromegaly bruits or anterior cervical adenopathy  Cardiovascular: Normal rate, regular rhythm, normal heart sounds and intact distal pulses.   No murmur heard. The heart is regular at 72/m  Pulmonary/Chest: Effort normal and breath sounds normal. No respiratory distress. He has no wheezes. He has no rales. He exhibits no tenderness.  No axillary adenopathy  Abdominal: Soft. Bowel sounds are normal. He exhibits no mass. There is no tenderness. There is no rebound and no guarding.  The patient has a small umbilical hernia and may have a larger ventral hernia that seems to bulge out with sitting up. He does have some gas. There was no liver or spleen enlargement and no inguinal adenopathy and no bruits  Musculoskeletal: Normal range of motion. He exhibits no edema.  The left sacroiliac area was slightly tender to palpation and with leg raising there was some discomfort in this area.  Lymphadenopathy:    He has no cervical adenopathy.  Neurological: He is alert and oriented to person, place, and time. He has normal reflexes. No cranial nerve deficit.  Skin: Skin is warm and dry. No rash noted.  Psychiatric: He has a normal mood and affect. His behavior is normal. Judgment and thought content normal.  Nursing note and vitals reviewed.  BP 132/71 (BP Location: Left Arm)    Pulse 69   Temp 98.1 F (36.7 C) (Oral)   Ht 5' 10" (1.778 m)   Wt 208 lb (94.3 kg)   BMI 29.84 kg/m         Assessment & Plan:  1. Uncontrolled type 2 diabetes mellitus without complication, without long-term current use of insulin ([place]) -Continue aggressive therapeutic lifestyle changes along with current treatment pending results of lab work - Bayer DCA Hb A1c Waived - BMP8+EGFR - CBC with Differential/Platelet  2. Essential hypertension -The blood pressure is good today and the patient will continue with current treatment - BMP8+EGFR - CBC with Differential/Platelet - Hepatic function panel - DG Chest 2 View; Future  3. Hyperlipidemia associated with type 2 diabetes mellitus ([place]) -Continue with current treatment and as aggressive therapeutic lifestyle changes as possible - CBC with Differential/Platelet - NMR, lipoprofile - DG Chest 2 View; Future  4. Vitamin D deficiency -Continue with current treatment pending results of lab work - CBC with Differential/Platelet - VITAMIN D 25 Hydroxy (Vit-D Deficiency, Fractures)  5. Testosterone deficiency -Continue with AndroGel, rectal  exam and testosterone levels at next visit - CBC with Differential/Platelet  6. Benign prostatic hyperplasia, unspecified whether lower urinary tract symptoms present -No complaints with passing his water today - CBC with Differential/Platelet  7. GERD with reflux -Continue with Zantac but take this more regularly and do this for at least 4-6 weeks and then try to taper off at that time if possible - CBC with Differential/Platelet - Hepatic function panel  8. Encounter for immunization - Flu Vaccine QUAD 36+ mos IM  9. Low back pain, unspecified back pain laterality, unspecified chronicity, with sciatica presence unspecified -Use warm wet compresses, take Tylenol for pain, avoid heavy lifting and try salonpas  Patient Instructions  Continue current medications. Continue good  therapeutic lifestyle changes which include good diet and exercise. Fall precautions discussed with patient. If an FOBT was given today- please return it to our front desk. If you are over 63 years old - you may need Prevnar 5 or the adult Pneumonia vaccine.  **Flu shots are available--- please call and schedule a FLU-CLINIC appointment**  After your visit with [place] today you will receive a survey in the mail or online from [employer] regarding your care with [place]. Please take a moment to fill this out. Your feedback is very important to [place] as you can help [place] better understand your patient needs as well as improve your experience and satisfaction. WE CARE ABOUT YOU!!!   Use warm wet compresses to the low back and take extra strength Tylenol as needed for pain Try to avoid as much heavy lifting as possible Take ranitidine or Zantac more regularly before breakfast and supper at least for the next 4-6 weeks and then try to taper off again Avoid foods that can irritate the stomach Consider trying a probiotic because of the gas and bloating like align Check blood sugars regularly and keep feet checked regularly Salonpas can be tried over-the-counter for the low back pain--- this is apply topically.    Arrie Senate MD

## 2016-04-10 NOTE — Patient Instructions (Addendum)
Continue current medications. Continue good therapeutic lifestyle changes which include good diet and exercise. Fall precautions discussed with patient. If an FOBT was given today- please return it to our front desk. If you are over 61 years old - you may need Prevnar 13 or the adult Pneumonia vaccine.  **Flu shots are available--- please call and schedule a FLU-CLINIC appointment**  After your visit with us today you will receive a survey in the mail or online from [employer] regarding your care with us. Please take a moment to fill this out. Your feedback is very important to us as you can help us better understand your patient needs as well as improve your experience and satisfaction. WE CARE ABOUT YOU!!!   Use warm wet compresses to the low back and take extra strength Tylenol as needed for pain Try to avoid as much heavy lifting as possible Take ranitidine or Zantac more regularly before breakfast and supper at least for the next 4-6 weeks and then try to taper off again Avoid foods that can irritate the stomach Consider trying a probiotic because of the gas and bloating like align Check blood sugars regularly and keep feet checked regularly Salonpas can be tried over-the-counter for the low back pain--- this is apply topically.

## 2016-04-11 LAB — CBC WITH DIFFERENTIAL/PLATELET
BASOS ABS: 0 10*3/uL (ref 0.0–0.2)
Basos: 0 %
EOS (ABSOLUTE): 0.1 10*3/uL (ref 0.0–0.4)
Eos: 1 %
HEMOGLOBIN: 15.3 g/dL (ref 12.6–17.7)
Hematocrit: 44.4 % (ref 37.5–51.0)
IMMATURE GRANS (ABS): 0 10*3/uL (ref 0.0–0.1)
Immature Granulocytes: 1 %
LYMPHS ABS: 1.9 10*3/uL (ref 0.7–3.1)
Lymphs: 25 %
MCH: 32 pg (ref 26.6–33.0)
MCHC: 34.5 g/dL (ref 31.5–35.7)
MCV: 93 fL (ref 79–97)
MONOS ABS: 0.3 10*3/uL (ref 0.1–0.9)
Monocytes: 4 %
Neutrophils Absolute: 5.3 10*3/uL (ref 1.4–7.0)
Neutrophils: 69 %
PLATELETS: 229 10*3/uL (ref 150–379)
RBC: 4.78 x10E6/uL (ref 4.14–5.80)
RDW: 14.3 % (ref 12.3–15.4)
WBC: 7.7 10*3/uL (ref 3.4–10.8)

## 2016-04-11 LAB — HEPATIC FUNCTION PANEL
ALBUMIN: 4.9 g/dL — AB (ref 3.6–4.8)
ALT: 73 IU/L — ABNORMAL HIGH (ref 0–44)
AST: 51 IU/L — ABNORMAL HIGH (ref 0–40)
Alkaline Phosphatase: 75 IU/L (ref 39–117)
BILIRUBIN, DIRECT: 0.19 mg/dL (ref 0.00–0.40)
Bilirubin Total: 0.7 mg/dL (ref 0.0–1.2)
TOTAL PROTEIN: 7.8 g/dL (ref 6.0–8.5)

## 2016-04-11 LAB — BMP8+EGFR
BUN / CREAT RATIO: 15 (ref 10–24)
BUN: 14 mg/dL (ref 8–27)
CHLORIDE: 100 mmol/L (ref 96–106)
CO2: 19 mmol/L (ref 18–29)
Calcium: 9.8 mg/dL (ref 8.6–10.2)
Creatinine, Ser: 0.93 mg/dL (ref 0.76–1.27)
GFR calc Af Amer: 103 mL/min/{1.73_m2} (ref >59)
GFR calc non Af Amer: 89 mL/min/{1.73_m2} (ref >59)
GLUCOSE: 148 mg/dL — AB (ref 65–99)
POTASSIUM: 4.5 mmol/L (ref 3.5–5.2)
SODIUM: 143 mmol/L (ref 134–144)

## 2016-04-11 LAB — VITAMIN D 25 HYDROXY (VIT D DEFICIENCY, FRACTURES): VIT D 25 HYDROXY: 43.3 ng/mL (ref 30.0–100.0)

## 2016-04-12 ENCOUNTER — Other Ambulatory Visit: Payer: Self-pay | Admitting: Family Medicine

## 2016-04-12 LAB — NMR, LIPOPROFILE
CHOLESTEROL: 118 mg/dL (ref 100–199)
HDL Cholesterol by NMR: 32 mg/dL — ABNORMAL LOW (ref >39)
HDL PARTICLE NUMBER: 27.7 umol/L — AB (ref >=30.5)
LDL Particle Number: 1179 nmol/L — ABNORMAL HIGH (ref <1000)
LDL SIZE: 19.8 nm (ref >20.5)
LDL-C: 60 mg/dL (ref 0–99)
LP-IR SCORE: 81 — AB (ref <=45)
Small LDL Particle Number: 927 nmol/L — ABNORMAL HIGH (ref <=527)
Triglycerides by NMR: 129 mg/dL (ref 0–149)

## 2016-05-03 ENCOUNTER — Other Ambulatory Visit: Payer: Self-pay | Admitting: Family Medicine

## 2016-06-04 ENCOUNTER — Other Ambulatory Visit: Payer: Self-pay | Admitting: Family Medicine

## 2016-06-23 ENCOUNTER — Other Ambulatory Visit: Payer: Self-pay

## 2016-06-24 ENCOUNTER — Other Ambulatory Visit: Payer: Self-pay | Admitting: Family Medicine

## 2016-06-24 NOTE — Telephone Encounter (Signed)
Patient last seen in office on 10-6. Last RV was 10-19. If approved rx will print

## 2016-07-20 ENCOUNTER — Other Ambulatory Visit: Payer: Self-pay | Admitting: Family Medicine

## 2016-08-16 ENCOUNTER — Other Ambulatory Visit: Payer: Self-pay | Admitting: Family Medicine

## 2016-09-10 ENCOUNTER — Ambulatory Visit (INDEPENDENT_AMBULATORY_CARE_PROVIDER_SITE_OTHER): Payer: BLUE CROSS/BLUE SHIELD | Admitting: Family Medicine

## 2016-09-10 ENCOUNTER — Encounter: Payer: Self-pay | Admitting: Family Medicine

## 2016-09-10 VITALS — BP 119/67 | HR 71 | Temp 97.5°F | Ht 70.0 in | Wt 212.0 lb

## 2016-09-10 DIAGNOSIS — I10 Essential (primary) hypertension: Secondary | ICD-10-CM | POA: Diagnosis not present

## 2016-09-10 DIAGNOSIS — Z0000 Encounter for general adult medical examination without abnormal findings: Secondary | ICD-10-CM | POA: Diagnosis not present

## 2016-09-10 DIAGNOSIS — E559 Vitamin D deficiency, unspecified: Secondary | ICD-10-CM | POA: Diagnosis not present

## 2016-09-10 DIAGNOSIS — E349 Endocrine disorder, unspecified: Secondary | ICD-10-CM

## 2016-09-10 DIAGNOSIS — E1165 Type 2 diabetes mellitus with hyperglycemia: Secondary | ICD-10-CM

## 2016-09-10 DIAGNOSIS — K219 Gastro-esophageal reflux disease without esophagitis: Secondary | ICD-10-CM

## 2016-09-10 DIAGNOSIS — R0989 Other specified symptoms and signs involving the circulatory and respiratory systems: Secondary | ICD-10-CM

## 2016-09-10 DIAGNOSIS — N400 Enlarged prostate without lower urinary tract symptoms: Secondary | ICD-10-CM | POA: Diagnosis not present

## 2016-09-10 DIAGNOSIS — E785 Hyperlipidemia, unspecified: Secondary | ICD-10-CM | POA: Diagnosis not present

## 2016-09-10 DIAGNOSIS — I7121 Aneurysm of the ascending aorta, without rupture: Secondary | ICD-10-CM

## 2016-09-10 DIAGNOSIS — IMO0001 Reserved for inherently not codable concepts without codable children: Secondary | ICD-10-CM

## 2016-09-10 DIAGNOSIS — E1169 Type 2 diabetes mellitus with other specified complication: Secondary | ICD-10-CM

## 2016-09-10 DIAGNOSIS — I712 Thoracic aortic aneurysm, without rupture: Secondary | ICD-10-CM

## 2016-09-10 DIAGNOSIS — K760 Fatty (change of) liver, not elsewhere classified: Secondary | ICD-10-CM

## 2016-09-10 DIAGNOSIS — R140 Abdominal distension (gaseous): Secondary | ICD-10-CM

## 2016-09-10 LAB — URINALYSIS, COMPLETE
Bilirubin, UA: NEGATIVE
KETONES UA: NEGATIVE
Leukocytes, UA: NEGATIVE
NITRITE UA: NEGATIVE
PH UA: 5 (ref 5.0–7.5)
Protein, UA: NEGATIVE
Specific Gravity, UA: 1.015 (ref 1.005–1.030)
Urobilinogen, Ur: 0.2 mg/dL (ref 0.2–1.0)

## 2016-09-10 LAB — MICROSCOPIC EXAMINATION
Bacteria, UA: NONE SEEN
EPITHELIAL CELLS (NON RENAL): NONE SEEN /HPF (ref 0–10)
RBC, UA: NONE SEEN /hpf (ref 0–?)
RENAL EPITHEL UA: NONE SEEN /HPF
WBC, UA: NONE SEEN /hpf (ref 0–?)

## 2016-09-10 NOTE — Patient Instructions (Addendum)
Continue current medications. Continue good therapeutic lifestyle changes which include good diet and exercise. Fall precautions discussed with patient. If an FOBT was given today- please return it to our front desk. If you are over 62 years old - you may need Prevnar 13 or the adult Pneumonia vaccine.  **Flu shots are available--- please call and schedule a FLU-CLINIC appointment**  After your visit with us today you will receive a survey in the mail or online from [employer] regarding your care with us. Please take a moment to fill this out. Your feedback is very important to us as you can help us better understand your patient needs as well as improve your experience and satisfaction. WE CARE ABOUT YOU!!!   We will call with lab work as soon as it becomes available Continue current treatment protocol pending results of lab work Always bring blood sugars to the office visit with you for review Follow-up with cardiologist as planned Mention to him about the carotid bruits that were audible today. He may consider getting carotid Dopplers. Also discuss with him the occasional tightness that you have in your chest which is after exercise. The patient is in need of an eye exam he should schedule a visit with his ophthalmologist to make sure that we get a copy of that report A good hearing specialist is Dr. Maximino SarinEric Kra[place]

## 2016-09-10 NOTE — Progress Notes (Signed)
Subjective:    Patient ID: [name] [name], male    DOB: [birth year], 61 y.o.   MRN: [id number]  HPI Patient is here today for annual wellness exam and follow up of chronic medical problems which includes diabetes, hypertension and hyperlipidemia. He is taking medication regularly.The patient today complains of headache is in sinus congestion. The patient sees Dr. Harl Bowie yearly in April. He is his cardiologist. He also has an ascending aortic aneurysm and Dr. Harl Bowie usually follows that up. Patient comes Place of some slight tightness in his chest and this is usually after exercising and not during exercising. He thinks this is more musculoskeletal in nature and he will discuss this with his cardiologist when he sees him in April. He does have occasional shortness of breath with exertion. He denies any nausea vomiting diarrhea or change in bowel habits. He does have abdominal bloating and he thinks this is most likely related to his intake of food. He has a history of a fatty liver. He's passing his water without problems. He says that his blood sugars at home usually run around 150 fasting and maybe as high as 200 during the day. His last eye exam was in August 2016 and he understands that he should be getting this on a yearly basis.    Patient Active Problem List   Diagnosis Date Noted  . Type 2 diabetes mellitus with hyperlipidemia ([place]) 12/04/2015  . Sinus congestion 02/18/2015  . GERD (gastroesophageal reflux disease) 03/08/2014  . NAFLD (nonalcoholic fatty liver disease) 03/08/2014  . Elevated liver enzymes 03/08/2014  . Ascending aortic aneurysm ([place]) 12/12/2012  . INSOMNIA 09/15/2010  . Atrial fibrillation, chronic ([place]) 09/15/2010  . CAD (coronary artery disease) 07/09/2009  . AORTIC VALVE DISORDERS 05/27/2009  . OSTEOARTHRITIS, LOCALIZED 05/27/2009  . Diabetes mellitus type 2, uncontrolled, without complications ([place]) 71/24/5809  . Hyperlipidemia with target LDL less than 70  05/24/2009  . Hypokalemia 05/24/2009  . Essential hypertension 05/24/2009  . UNSPECIFIED SLEEP APNEA 05/24/2009   Outpatient Encounter Prescriptions as of 09/10/2016  Medication Sig  . ANDROGEL PUMP 20.25 MG/ACT (1.62%) GEL APPLY 3 PUMPS TOPICALLY EVERY DAY - PDM COUPON ON FILE - JB 04/23/2016  . aspirin (SB LOW DOSE ASA EC) 81 MG EC tablet Take 81 mg by mouth daily.    [name] atenolol (TENORMIN) 25 MG tablet TAKE 1 TABLET DAILY  . atorvastatin (LIPITOR) 40 MG tablet TAKE 1 TABLET DAILY  . cholecalciferol (VITAMIN D) 1000 UNITS tablet Take 2,000 Units by mouth daily.   . fluticasone (FLONASE) 50 MCG/ACT nasal spray Place 2 sprays into the nose daily as needed.  [name] glipiZIDE (GLUCOTROL) 5 MG tablet TAKE 1 TABLET TWICE A DAY BEFORE A MEAL (DISCONTINUE NATEGLINIDE/STARLIX)  . guaiFENesin (MUCINEX) 600 MG 12 hr tablet Take by mouth 2 (two) times daily.  [name] JARDIANCE 25 MG TABS tablet TAKE 1 TABLET BY MOUTH EVERY DAY (MCK CARD)  . lisinopril (PRINIVIL,ZESTRIL) 20 MG tablet TAKE 1 TABLET DAILY  . metFORMIN (GLUCOPHAGE) 1000 MG tablet TAKE 1 TABLET TWICE DAILY WITH MEALS  . promethazine (PHENERGAN) 25 MG tablet TAKE 1 TABLET EVERY 4 HOURS AS NEEDED  . ranitidine (ZANTAC) 150 MG tablet Take 150 mg by mouth 2 (two) times daily.   . temazepam (RESTORIL) 15 MG capsule TAKE 1 TABLET BY MOUTH AT BEDTIME AS NEEDED   No facility-administered encounter medications on file as of 09/10/2016.       Review of Systems  Constitutional: Negative.   HENT: Positive  for congestion and sinus pressure.   Eyes: Negative.   Respiratory: Negative.   Cardiovascular: Negative.   Gastrointestinal: Negative.   Endocrine: Negative.   Genitourinary: Negative.   Musculoskeletal: Negative.   Skin: Negative.   Allergic/Immunologic: Negative.   Neurological: Negative.   Hematological: Negative.   Psychiatric/Behavioral: Negative.        Objective:   Physical Exam  Constitutional: He is oriented to person, place, and time.  He appears well-developed and well-nourished. No distress.  The patient is pleasant and alert.  HENT:  Head: Normocephalic and atraumatic.  Right Ear: External ear normal.  Left Ear: External ear normal.  Mouth/Throat: Oropharynx is clear and moist. No oropharyngeal exudate.  Slight nasal congestion bilaterally  Eyes: Conjunctivae and EOM are normal. Pupils are equal, round, and reactive to light. Right eye exhibits no discharge. Left eye exhibits no discharge. No scleral icterus.  Neck: Normal range of motion. Neck supple. No thyromegaly present.  No  thyromegaly or anterior cervical adenopathy, there were bilateral carotid bruits.  Cardiovascular: Normal rate, regular rhythm and intact distal pulses.   Murmur heard. The heart has a regular rate and rhythm at 72/m with a grade 2/6 systolic ejection murmur  Pulmonary/Chest: Effort normal and breath sounds normal. No respiratory distress. He has no wheezes. He has no rales. He exhibits no tenderness.  No axillary adenopathy and lungs were clear anteriorly and posteriorly  Abdominal: Soft. Bowel sounds are normal. He exhibits no mass. There is no tenderness. There is no rebound and no guarding.  No liver or spleen enlargement no bruits and no inguinal adenopathy. Inguinal pulses were slightly diminished bilaterally.  Genitourinary: Rectum normal and penis normal.  Genitourinary Comments: The prostate was smooth and slightly enlarged. There were no rectal masses. External genitalia were within normal limits with no inguinal hernias being palpated.  Musculoskeletal: Normal range of motion. He exhibits no edema.  Lymphadenopathy:    He has no cervical adenopathy.  Neurological: He is alert and oriented to person, place, and time. He has normal reflexes. No cranial nerve deficit.  Skin: Skin is warm and dry. No rash noted.  Psychiatric: He has a normal mood and affect. His behavior is normal. Judgment and thought content normal.  Nursing note and  vitals reviewed.  BP 119/67 (BP Location: Left Arm)   Pulse 71   Temp 97.5 F (36.4 C) (Oral)   Ht '5\' 10"'$  (1.778 m)   Wt 212 lb (96.2 kg)   BMI 30.42 kg/m         Assessment & Plan:  1. Uncontrolled type 2 diabetes mellitus without complication, without long-term current use of insulin ([place]) -Patient will continue to monitor his blood sugars closely and will take his current medication pending results of lab work -He should continue with aggressive therapeutic lifestyle changes - CBC with Differential/Platelet  2. Essential hypertension -The blood pressure is good today and he will continue with current treatment - BMP8+EGFR - CBC with Differential/Platelet - Hepatic function panel  3. Hyperlipidemia associated with type 2 diabetes mellitus ([place]) -Continue with aggressive therapeutic lifestyle changes and current medicine for cholesterol - CBC with Differential/Platelet - Lipid panel  4. Vitamin D deficiency -Continue current treatment pending results of lab work  -CBC with Differential/Platelet - VITAMIN D 25 Hydroxy (Vit-D Deficiency, Fractures)  5. Testosterone deficiency -The patient says his sexual function is good with this treatment. He cannot tell that it helps his energy level any but it does help his sexual function. -  CBC with Differential/Platelet - Testosterone,Free and Total - PSA, total and free - Urinalysis, Complete  6. Benign prostatic hyperplasia, unspecified whether lower urinary tract symptoms present -No trouble with passing his water. - CBC with Differential/Platelet - Testosterone,Free and Total - PSA, total and free - Urinalysis, Complete  7. Gastroesophageal reflux disease, esophagitis presence not specified -No complaints today with reflux. - CBC with Differential/Platelet - Hepatic function panel  8. Annual physical exam The patient understands that he will be due a colonoscopy in 2022. BMP8+EGFR - CBC with Differential/Platelet -  Hepatic function panel - VITAMIN D 25 Hydroxy (Vit-D Deficiency, Fractures) - Testosterone,Free and Total - PSA, total and free - Lipid panel - Urinalysis, Complete  9. Type 2 diabetes mellitus with hyperlipidemia ([place]) -Continue aggressive therapeutic lifestyle changes and current treatment  10. NAFLD (nonalcoholic fatty liver disease) -He is aware of the fatty liver and we will continue to monitor this.  11. Ascending aortic aneurysm ([place]) -Follow-up with cardiology  12. Abdominal bloating -Watch diet more closely especially with milk cheese ice cream and dairy products  13. Bilateral carotid bruits -Consider getting bilateral carotid Dopplers. We will leave that up to the cardiologist.  Patient Instructions  Continue current medications. Continue good therapeutic lifestyle changes which include good diet and exercise. Fall precautions discussed with patient. If an FOBT was given today- please return it to our front desk. If you are over 20 years old - you may need Prevnar 68 or the adult Pneumonia vaccine.  **Flu shots are available--- please call and schedule a FLU-CLINIC appointment**  After your visit with [place] today you will receive a survey in the mail or online from [employer] regarding your care with [place]. Please take a moment to fill this out. Your feedback is very important to [place] as you can help [place] better understand your patient needs as well as improve your experience and satisfaction. WE CARE ABOUT YOU!!!   We will call with lab work as soon as it becomes available Continue current treatment protocol pending results of lab work Always bring blood sugars to the office visit with you for review Follow-up with cardiologist as planned Mention to him about the carotid bruits that were audible today. He may consider getting carotid Dopplers. Also discuss with him the occasional tightness that you have in your chest which is after exercise. The patient is in need of an eye  exam he should schedule a visit with his ophthalmologist to make sure that we get a copy of that report A good hearing specialist is Dr. Hermina Barters and he is in [place].  Arrie Senate MD

## 2016-09-11 LAB — TESTOSTERONE,FREE AND TOTAL
TESTOSTERONE FREE: 8 pg/mL (ref 6.6–18.1)
Testosterone: 437 ng/dL (ref 264–916)

## 2016-09-11 LAB — PSA, TOTAL AND FREE
PROSTATE SPECIFIC AG, SERUM: 0.3 ng/mL (ref 0.0–4.0)
PSA FREE: 0.16 ng/mL
PSA, Free Pct: 53.3 %

## 2016-09-11 LAB — CBC WITH DIFFERENTIAL/PLATELET
BASOS ABS: 0 10*3/uL (ref 0.0–0.2)
Basos: 0 %
EOS (ABSOLUTE): 0.2 10*3/uL (ref 0.0–0.4)
Eos: 3 %
Hematocrit: 44.2 % (ref 37.5–51.0)
Hemoglobin: 14.9 g/dL (ref 13.0–17.7)
IMMATURE GRANULOCYTES: 0 %
Immature Grans (Abs): 0 10*3/uL (ref 0.0–0.1)
Lymphocytes Absolute: 1.4 10*3/uL (ref 0.7–3.1)
Lymphs: 23 %
MCH: 31.9 pg (ref 26.6–33.0)
MCHC: 33.7 g/dL (ref 31.5–35.7)
MCV: 95 fL (ref 79–97)
MONOS ABS: 0.5 10*3/uL (ref 0.1–0.9)
Monocytes: 8 %
NEUTROS PCT: 66 %
Neutrophils Absolute: 4 10*3/uL (ref 1.4–7.0)
PLATELETS: 200 10*3/uL (ref 150–379)
RBC: 4.67 x10E6/uL (ref 4.14–5.80)
RDW: 13.8 % (ref 12.3–15.4)
WBC: 6.1 10*3/uL (ref 3.4–10.8)

## 2016-09-11 LAB — HEPATIC FUNCTION PANEL
ALBUMIN: 4.7 g/dL (ref 3.6–4.8)
ALT: 41 IU/L (ref 0–44)
AST: 24 IU/L (ref 0–40)
Alkaline Phosphatase: 80 IU/L (ref 39–117)
BILIRUBIN TOTAL: 0.7 mg/dL (ref 0.0–1.2)
Bilirubin, Direct: 0.21 mg/dL (ref 0.00–0.40)
TOTAL PROTEIN: 7.5 g/dL (ref 6.0–8.5)

## 2016-09-11 LAB — BMP8+EGFR
BUN/Creatinine Ratio: 16 (ref 10–24)
BUN: 16 mg/dL (ref 8–27)
CALCIUM: 9.5 mg/dL (ref 8.6–10.2)
CO2: 23 mmol/L (ref 18–29)
Chloride: 97 mmol/L (ref 96–106)
Creatinine, Ser: 1 mg/dL (ref 0.76–1.27)
GFR calc Af Amer: 93 mL/min/{1.73_m2} (ref >59)
GFR calc non Af Amer: 81 mL/min/{1.73_m2} (ref >59)
GLUCOSE: 181 mg/dL — AB (ref 65–99)
POTASSIUM: 4.1 mmol/L (ref 3.5–5.2)
Sodium: 141 mmol/L (ref 134–144)

## 2016-09-11 LAB — LIPID PANEL
CHOLESTEROL TOTAL: 115 mg/dL (ref 100–199)
Chol/HDL Ratio: 4.1 ratio units (ref 0.0–5.0)
HDL: 28 mg/dL — ABNORMAL LOW (ref >39)
LDL Calculated: 63 mg/dL (ref 0–99)
Triglycerides: 120 mg/dL (ref 0–149)
VLDL Cholesterol Cal: 24 mg/dL (ref 5–40)

## 2016-09-11 LAB — VITAMIN D 25 HYDROXY (VIT D DEFICIENCY, FRACTURES): Vit D, 25-Hydroxy: 46.8 ng/mL (ref 30.0–100.0)

## 2016-09-16 ENCOUNTER — Encounter: Payer: Self-pay | Admitting: *Deleted

## 2016-10-19 ENCOUNTER — Other Ambulatory Visit: Payer: Self-pay | Admitting: Family Medicine

## 2016-10-27 ENCOUNTER — Encounter: Payer: Self-pay | Admitting: *Deleted

## 2016-10-28 ENCOUNTER — Encounter: Payer: Self-pay | Admitting: Cardiology

## 2016-10-28 ENCOUNTER — Ambulatory Visit (INDEPENDENT_AMBULATORY_CARE_PROVIDER_SITE_OTHER): Payer: BLUE CROSS/BLUE SHIELD | Admitting: Cardiology

## 2016-10-28 VITALS — BP 132/75 | HR 58 | Ht 70.0 in | Wt 212.8 lb

## 2016-10-28 DIAGNOSIS — R0600 Dyspnea, unspecified: Secondary | ICD-10-CM

## 2016-10-28 DIAGNOSIS — I712 Thoracic aortic aneurysm, without rupture: Secondary | ICD-10-CM | POA: Diagnosis not present

## 2016-10-28 DIAGNOSIS — I10 Essential (primary) hypertension: Secondary | ICD-10-CM

## 2016-10-28 DIAGNOSIS — I7121 Aneurysm of the ascending aorta, without rupture: Secondary | ICD-10-CM

## 2016-10-28 DIAGNOSIS — R0602 Shortness of breath: Secondary | ICD-10-CM | POA: Diagnosis not present

## 2016-10-28 DIAGNOSIS — Z952 Presence of prosthetic heart valve: Secondary | ICD-10-CM

## 2016-10-28 NOTE — Progress Notes (Signed)
Clinical Summary Mr. Huegel is a 61 y.o.male  seen today for follow up of the following medical problems.   1. AVR - history of bicuspid AV, s/p bioprosthetic AVR in 2012  - has had some recent SOB. Walks regularly, can have some chest tightness with walking - tighthness on left side, 5-6/10. No other associated symptoms. Lasts few minutes, gets better with rest. Noticeable with walking up hill. Never occurs at rest. Symptoms started several months, stable in frequency and severity.    2. Ascending aortic aneurysm - mild aortic root dilatation at 4.1 to 4.2 cm by last MRI 10/2013, likely related to hx of bicuspid AV and potential coexisting aortopathy - CTA 10/2014 4.1 cm ascending aortic aneurysm.  .   3. HTN - on higher dose of lisinopril had symptomatic low bp's, back down to  daily by pcp.  - compliant with meds   4. OSA - not compliant with CPAP due to discomfort. Has not been intrested in retyring  5. Hyperlipidemia - compliant with lipitor - Last panel 09/2016 TC 115 TG 120 HDL 28 LDL 63  6. DM2 - followed by pcp - 04/2016 HgbA1c 7.5   7. Pulmonary  Nodules - stable by imaging.  Past Medical History:  Diagnosis Date  . Aortic stenosis 8/09   Bicuspid aortic valve  . Arthritis 6/10   hand   . Atrial fibrillation ([facility])    post op only  . CAD (coronary artery disease) 8/09  . CTS (carpal tunnel syndrome)    bilateral   . Diastolic dysfunction 8/09  . Elevated LFTs   . Fatty liver disease, nonalcoholic   . Hyperlipidemia   . Hypertension   . Kidney stones   . Nasal congestion   . NIDDM (non-insulin dependent diabetes mellitus)    x15 yrs  . OSA on CPAP 2006  . Paresthesias in left hand 02/19/2004  . Paresthesias in right hand 02/19/2004  . Sinusitis   . Vitamin D deficiency 06/13/09     Allergies  Allergen Reactions  . Hctz [Hydrochlorothiazide]     Nausea and headache   . Sulfa Antibiotics   . Sulfonamide Derivatives      Current  Outpatient Prescriptions  Medication Sig Dispense Refill  . ANDROGEL PUMP 20.25 MG/ACT (1.62%) GEL APPLY 3 PUMPS TOPICALLY EVERY DAY - PDM COUPON ON FILE - JB 04/23/2016 75 g 2  . aspirin (SB LOW DOSE ASA EC) 81 MG EC tablet Take 81 mg by mouth daily.      [name] atenolol (TENORMIN) 25 MG tablet TAKE 1 TABLET DAILY 90 tablet 3  . atorvastatin (LIPITOR) 40 MG tablet TAKE 1 TABLET DAILY 90 tablet 1  . cholecalciferol (VITAMIN D) 1000 UNITS tablet Take 2,000 Units by mouth daily.     . fluticasone (FLONASE) 50 MCG/ACT nasal spray Place 2 sprays into the nose daily as needed.    [name] glipiZIDE (GLUCOTROL) 5 MG tablet TAKE 1 TABLET TWICE A DAY BEFORE A MEAL (DISCONTINUE NATEGLINIDE/STARLIX) 180 tablet 1  . guaiFENesin (MUCINEX) 600 MG 12 hr tablet Take by mouth 2 (two) times daily.    [name] JARDIANCE 25 MG TABS tablet TAKE 1 TABLET BY MOUTH EVERY DAY (MCK CARD) 30 tablet 4  . lisinopril (PRINIVIL,ZESTRIL) 20 MG tablet TAKE 1 TABLET DAILY 90 tablet 1  . metFORMIN (GLUCOPHAGE) 1000 MG tablet TAKE 1 TABLET TWICE DAILY WITH MEALS 180 tablet 1  . promethazine (PHENERGAN) 25 MG tablet TAKE 1 TABLET EVERY 4 HOURS  AS NEEDED 30 tablet 1  . ranitidine (ZANTAC) 150 MG tablet Take 150 mg by mouth 2 (two) times daily.     . temazepam (RESTORIL) 15 MG capsule TAKE 1 TABLET BY MOUTH AT BEDTIME AS NEEDED 30 capsule 2   No current facility-administered medications for this visit.      Past Surgical History:  Procedure Laterality Date  . CARPAL TUNNEL RELEASE     bilateral   . CORONARY ANGIOPLASTY WITH STENT PLACEMENT     to rt coronary atery (Dr. Degert-cardiologist)  . TISSUE AORTIC VALVE REPLACEMENT     2012     Allergies  Allergen Reactions  . Hctz [Hydrochlorothiazide]     Nausea and headache   . Sulfa Antibiotics   . Sulfonamide Derivatives       Family History  Problem Relation Age of Onset  . Hearing loss Mother   . Alzheimer's disease Mother   . Hearing loss Father   . Diabetes Father   .  Diabetes      Family History      Social History Mr. Rockett reports that he quit smoking about 14 years ago. His smoking use included Cigarettes. He started smoking about 42 years ago. He has a 37.50 pack-year smoking history. He has never used smokeless tobacco. Mr. Barella reports that he does not drink alcohol.   Review of Systems CONSTITUTIONAL: No weight loss, fever, chills, weakness or fatigue.  HEENT: Eyes: No visual loss, blurred vision, double vision or yellow sclerae.No hearing loss, sneezing, congestion, runny nose or sore throat.  SKIN: No rash or itching.  CARDIOVASCULAR: per HPI RESPIRATORY: per HPI GASTROINTESTINAL: No anorexia, nausea, vomiting or diarrhea. No abdominal pain or blood.  GENITOURINARY: No burning on urination, no polyuria NEUROLOGICAL: No headache, dizziness, syncope, paralysis, ataxia, numbness or tingling in the extremities. No change in bowel or bladder control.  MUSCULOSKELETAL: No muscle, back pain, joint pain or stiffness.  LYMPHATICS: No enlarged nodes. No history of splenectomy.  PSYCHIATRIC: No history of depression or anxiety.  ENDOCRINOLOGIC: No reports of sweating, cold or heat intolerance. No polyuria or polydipsia.  [name]   Physical Examination Vitals:   10/28/16 0935  BP: 132/75  Pulse: (!) 58   Vitals:   10/28/16 0935  Weight: 212 lb 12.8 oz (96.5 kg)  Height:  (1.778 m)    Gen: resting comfortably, no acute distress HEENT: no scleral icterus, pupils equal round and reactive, no palptable cervical adenopathy,  CV: RRR, 2/6 systolic murmur RUSB, no jvd Resp: Clear to auscultation bilaterally GI: abdomen is soft, non-tender, non-distended, normal bowel sounds, no hepatosplenomegaly MSK: extremities are warm, no edema.  Skin: warm, no rash Neuro:  no focal deficits Psych: appropriate affect   Diagnostic Studies 09/2012 MRI/MRA Chest Stable 4 cm ascending thoracic aneurysm  08/2010 Cath HEMODYNAMIC DATA: Right atrial  pressures 7/5 with a mean of 4 mmHg,  right ventricular pressure is 24 with an EDP of 7 mmHg. Pulmonary  artery pressure is 20/7 with a mean of 13 mmHg. Pulmonary capillary  wedge pressure is 10/7 with a mean of 6 mmHg. Aortic pressure is 118/67  with a mean of 88 mmHg. Left ventricular pressure is 161 with an EDP of  12 mmHg. There is no significant mitral valve gradient. The aortic  valve mean gradient is 31 mmHg with calculated valve area of 0.8  centimeter squared. Thermodilution cardiac output is 4.4 L per minute  with an index of 2, Fick cardiac outputs 4.7 L  per minute with an index  of 2.2.  ANGIOGRAPHIC DATA: The left ventricular angiography was performed in a  RAO view. This demonstrates normal left ventricular size and  contractility with normal systolic function. Ejection fraction is  estimated at 65%. The aortic valve is calcified with eccentric opening  and restrictive mobility.  The left coronary artery arises and distributes normally. The left main  coronary has 20% narrowing in the ostium and in the distal left main.  The left anterior descending artery is a tortuous vessel which has less  than 10% irregularities.  Left circumflex coronary artery is rise to first obtuse marginal vessel  which has a 40-50% stenosis proximally.  The right coronary artery is a dominant vessel, it has 20% disease in  the distal vessel. The stent in the midvessel was widely patent.  FINAL INTERPRETATION:  1. Nonobstructive atherosclerotic coronary artery disease.  2. Normal left ventricular function.  3. Severe aortic stenosis.  4. Normal right heart pressures.  PLAN: Proceed with aortic valve replacement.  10/2013 MRA Chest EXAM: MRA CHEST WITH OR WITHOUT CONTRAST  TECHNIQUE: Angiographic images of the chest were obtained using MRA technique without and with intravenous contrast.  CONTRAST: 20mL MULTIHANCE GADOBENATE DIMEGLUMINE 529 MG/ML IV  SOLN  COMPARISON: CT UROGRAM dated 05/08/2011; MR MRA CHEST W/ OR W/O CM dated 10/07/2012; CT ANGIO CHEST dated 09/08/2010  FINDINGS: There is stable aneurysmal dilatation of the aortic root that does not involve the sinuses of Valsalva. No aortic dissection is present. Artifact is noted from a prosthetic aortic valve. Diameter at the level of the sinuses of Valsalva is approximately 3.2 cm. Maximal caliber of the ascending thoracic aorta is 4.1- 4.2 cm. The proximal arch measures 3.8 cm. The distal arch measures 2.6 cm. The descending thoracic aorta measures 2.6 cm. Proximal great vessels show stable and normal patency without anatomical variant.  The heart size is normal. No pleural or pericardial fluid is identified. No masses or lymphadenopathy are seen. Visualized spine shows no abnormalities.  IMPRESSION: Stable aneurysmal dilatation of the ascending thoracic aorta measuring 4.1- 4.2 cm in greatest diameter. No dissection is identified.  10/2014 CTA Chest IMPRESSION: 1. Mild fusiform aneurysmal dilatation of the ascending thoracic aorta measuring approximately 41 mm in maximal diameter, stable since the 2012 examination. 2. Stable sequela of prior median sternotomy and aortic valve replacement. 3. Coronary artery calcifications. 4. Punctate (approximately 5 mm) in determine right lower lobe pulmonary nodule, not definitely seen on the 20/2012 examination. If the patient is at high risk for bronchogenic carcinoma, follow-up chest CT at 6-12 months is recommended. If the patient is at low risk for bronchogenic carcinoma, follow-up chest CT at 12 months is recommended. This recommendation follows the consensus statement:  11/2015 CTA chest  IMPRESSION: Mild aneurysmal dilatation of the ascending aorta with a maximal diameter of 4.0 cm. This is not significantly changed.   Assessment and Plan  1. Aortic valve replacement - hx of bicuspid AV, s/p bioprosthetic AVR in  2012 - reports recent DOE and chest pain. We will obtain echo to evaluate valve, pending results likely obtain exercise nuclear stress.  - ekg in clinic shows NSR   2. Ascending aortic root aneurysm - will repeat CTA  3. HTN - at goal, continue current meds  4. OSA - he is not interested in being reevaluated for CPAP  5. Hyperlipidemia - at goal, continue statin    F/u echo results, likely exercise nuclear stress pending echo findings.   F/u  pending test results   Antoine Poche, M.D.

## 2016-10-28 NOTE — Patient Instructions (Signed)
Your physician recommends that you schedule a follow-up appointment in: TO BE DETERMINED AFTER TESTING  Your physician recommends that you continue on your current medications as directed. Please refer to the Current Medication list given to you today.  Your physician has requested that you have an echocardiogram. Echocardiography is a painless test that uses sound waves to create images of your heart. It provides your doctor with information about the size and shape of your heart and how well your heart's chambers and valves are working. This procedure takes approximately one hour. There are no restrictions for this procedure.  Non-Cardiac CT Angiography (CTA), is a special type of CT scan that uses a computer to produce multi-dimensional views of major blood vessels throughout the body. In CT angiography, a contrast material is injected through an IV to help visualize the blood vessels  Thank you for choosing [facility]!!

## 2016-10-29 ENCOUNTER — Telehealth: Payer: Self-pay | Admitting: Cardiology

## 2016-10-29 NOTE — Telephone Encounter (Signed)
Pre-cert Verification for the following procedure    CT ANGIO CHEST AORTA scheduled for 11/11/16 at [facility]  ECHO scheduled for 11/18/16 at [facility]

## 2016-11-01 ENCOUNTER — Other Ambulatory Visit: Payer: Self-pay | Admitting: Family Medicine

## 2016-11-03 DIAGNOSIS — K8050 Calculus of bile duct without cholangitis or cholecystitis without obstruction: Secondary | ICD-10-CM

## 2016-11-03 DIAGNOSIS — K859 Acute pancreatitis without necrosis or infection, unspecified: Secondary | ICD-10-CM

## 2016-11-03 HISTORY — PX: CHOLECYSTECTOMY: SHX55

## 2016-11-03 HISTORY — DX: Calculus of bile duct without cholangitis or cholecystitis without obstruction: K80.50

## 2016-11-03 HISTORY — DX: Acute pancreatitis without necrosis or infection, unspecified: K85.90

## 2016-11-04 ENCOUNTER — Other Ambulatory Visit: Payer: Self-pay

## 2016-11-04 MED ORDER — EMPAGLIFLOZIN 25 MG PO TABS: ORAL_TABLET | ORAL | 1 refills | Status: DC

## 2016-11-11 ENCOUNTER — Ambulatory Visit (HOSPITAL_COMMUNITY)
Admission: RE | Admit: 2016-11-11 | Discharge: 2016-11-11 | Disposition: A | Discharge status: Home / Self Care | Payer: BLUE CROSS/BLUE SHIELD | Source: Ambulatory Visit | Attending: Cardiology | Admitting: Cardiology

## 2016-11-11 DIAGNOSIS — I7121 Aneurysm of the ascending aorta, without rupture: Secondary | ICD-10-CM

## 2016-11-11 DIAGNOSIS — I712 Thoracic aortic aneurysm, without rupture: Secondary | ICD-10-CM | POA: Insufficient documentation

## 2016-11-11 DIAGNOSIS — Z955 Presence of coronary angioplasty implant and graft: Secondary | ICD-10-CM | POA: Insufficient documentation

## 2016-11-11 DIAGNOSIS — I2510 Atherosclerotic heart disease of native coronary artery without angina pectoris: Secondary | ICD-10-CM | POA: Insufficient documentation

## 2016-11-11 DIAGNOSIS — R918 Other nonspecific abnormal finding of lung field: Secondary | ICD-10-CM | POA: Insufficient documentation

## 2016-11-11 LAB — POCT I-STAT CREATININE: CREATININE: 1 mg/dL (ref 0.61–1.24)

## 2016-11-11 MED ORDER — IOPAMIDOL (ISOVUE-370) INJECTION 76%
100.0000 mL | Freq: Once | INTRAVENOUS | Status: AC | PRN
  Administered 2016-11-11: 100 mL via INTRAVENOUS

## 2016-11-11 MED ORDER — IOPAMIDOL (ISOVUE-300) INJECTION 61%: 100.0000 mL | Freq: Once | INTRAVENOUS | Status: DC | PRN

## 2016-11-13 ENCOUNTER — Telehealth: Payer: Self-pay | Admitting: *Deleted

## 2016-11-13 NOTE — Telephone Encounter (Signed)
-----   Message from Antoine PocheJonathan F Branch, MD sent at 11/13/2016 12:38 PM EDT ----- CT shows stable anerurysm and lung nodlues, we will contineu to monitor  J BrancH MD

## 2016-11-13 NOTE — Telephone Encounter (Signed)
Pt aware - routed to pcp  

## 2016-11-14 ENCOUNTER — Encounter: Payer: Self-pay | Admitting: Family Medicine

## 2016-11-14 DIAGNOSIS — R9389 Abnormal findings on diagnostic imaging of other specified body structures: Secondary | ICD-10-CM | POA: Insufficient documentation

## 2016-11-18 ENCOUNTER — Other Ambulatory Visit: Payer: Self-pay

## 2016-11-18 ENCOUNTER — Ambulatory Visit (INDEPENDENT_AMBULATORY_CARE_PROVIDER_SITE_OTHER): Payer: BLUE CROSS/BLUE SHIELD

## 2016-11-18 ENCOUNTER — Encounter: Payer: Self-pay | Admitting: Family Medicine

## 2016-11-18 DIAGNOSIS — R0602 Shortness of breath: Secondary | ICD-10-CM | POA: Diagnosis not present

## 2016-11-18 DIAGNOSIS — R918 Other nonspecific abnormal finding of lung field: Secondary | ICD-10-CM | POA: Insufficient documentation

## 2016-11-21 DIAGNOSIS — E1165 Type 2 diabetes mellitus with hyperglycemia: Secondary | ICD-10-CM | POA: Diagnosis not present

## 2016-11-21 DIAGNOSIS — R109 Unspecified abdominal pain: Secondary | ICD-10-CM | POA: Diagnosis not present

## 2016-11-21 DIAGNOSIS — K851 Biliary acute pancreatitis without necrosis or infection: Secondary | ICD-10-CM | POA: Diagnosis not present

## 2016-11-21 DIAGNOSIS — K8020 Calculus of gallbladder without cholecystitis without obstruction: Secondary | ICD-10-CM | POA: Diagnosis not present

## 2016-11-21 DIAGNOSIS — Z794 Long term (current) use of insulin: Secondary | ICD-10-CM | POA: Diagnosis not present

## 2016-11-21 DIAGNOSIS — R112 Nausea with vomiting, unspecified: Secondary | ICD-10-CM | POA: Diagnosis not present

## 2016-11-22 DIAGNOSIS — K8030 Calculus of bile duct with cholangitis, unspecified, without obstruction: Secondary | ICD-10-CM | POA: Diagnosis not present

## 2016-11-23 ENCOUNTER — Telehealth (INDEPENDENT_AMBULATORY_CARE_PROVIDER_SITE_OTHER): Payer: Self-pay | Admitting: Internal Medicine

## 2016-11-23 DIAGNOSIS — E1165 Type 2 diabetes mellitus with hyperglycemia: Secondary | ICD-10-CM | POA: Diagnosis not present

## 2016-11-23 DIAGNOSIS — K8030 Calculus of bile duct with cholangitis, unspecified, without obstruction: Secondary | ICD-10-CM | POA: Diagnosis not present

## 2016-11-23 DIAGNOSIS — K8000 Calculus of gallbladder with acute cholecystitis without obstruction: Secondary | ICD-10-CM | POA: Diagnosis not present

## 2016-11-23 DIAGNOSIS — Z9049 Acquired absence of other specified parts of digestive tract: Secondary | ICD-10-CM | POA: Diagnosis not present

## 2016-11-23 DIAGNOSIS — K8020 Calculus of gallbladder without cholecystitis without obstruction: Secondary | ICD-10-CM | POA: Diagnosis not present

## 2016-11-23 DIAGNOSIS — E119 Type 2 diabetes mellitus without complications: Secondary | ICD-10-CM | POA: Diagnosis not present

## 2016-11-23 DIAGNOSIS — I10 Essential (primary) hypertension: Secondary | ICD-10-CM | POA: Diagnosis not present

## 2016-11-23 DIAGNOSIS — K859 Acute pancreatitis without necrosis or infection, unspecified: Secondary | ICD-10-CM | POA: Diagnosis not present

## 2016-11-23 NOTE — Telephone Encounter (Signed)
Dr. Marcha Soldersathey called this afternoon, he would like to speak to you regarding this patient.  He stated that he will need an ERCP sometime in the future.  I did let him know that you would be back on 11/24/16.  [phone]

## 2016-11-24 ENCOUNTER — Other Ambulatory Visit (INDEPENDENT_AMBULATORY_CARE_PROVIDER_SITE_OTHER): Payer: Self-pay | Admitting: Internal Medicine

## 2016-11-24 ENCOUNTER — Encounter (HOSPITAL_COMMUNITY): Payer: Self-pay

## 2016-11-24 ENCOUNTER — Encounter (HOSPITAL_COMMUNITY)
Admission: RE | Disposition: A | Discharge status: Home / Self Care | Payer: Self-pay | Source: Ambulatory Visit | Attending: Internal Medicine

## 2016-11-24 ENCOUNTER — Ambulatory Visit (HOSPITAL_COMMUNITY): Payer: BLUE CROSS/BLUE SHIELD | Admitting: Anesthesiology

## 2016-11-24 ENCOUNTER — Ambulatory Visit (HOSPITAL_COMMUNITY): Payer: BLUE CROSS/BLUE SHIELD

## 2016-11-24 ENCOUNTER — Inpatient Hospital Stay (HOSPITAL_COMMUNITY)
Admission: RE | Admit: 2016-11-24 | Discharge: 2016-11-28 | DRG: 394 | Disposition: A | Discharge status: Home / Self Care | Payer: BLUE CROSS/BLUE SHIELD | Source: Ambulatory Visit | Attending: Internal Medicine | Admitting: Internal Medicine

## 2016-11-24 DIAGNOSIS — Z87891 Personal history of nicotine dependence: Secondary | ICD-10-CM | POA: Diagnosis not present

## 2016-11-24 DIAGNOSIS — Z955 Presence of coronary angioplasty implant and graft: Secondary | ICD-10-CM

## 2016-11-24 DIAGNOSIS — E785 Hyperlipidemia, unspecified: Secondary | ICD-10-CM | POA: Diagnosis present

## 2016-11-24 DIAGNOSIS — I10 Essential (primary) hypertension: Secondary | ICD-10-CM | POA: Diagnosis present

## 2016-11-24 DIAGNOSIS — G4733 Obstructive sleep apnea (adult) (pediatric): Secondary | ICD-10-CM | POA: Diagnosis not present

## 2016-11-24 DIAGNOSIS — K8050 Calculus of bile duct without cholangitis or cholecystitis without obstruction: Secondary | ICD-10-CM

## 2016-11-24 DIAGNOSIS — R109 Unspecified abdominal pain: Secondary | ICD-10-CM | POA: Diagnosis present

## 2016-11-24 DIAGNOSIS — E1151 Type 2 diabetes mellitus with diabetic peripheral angiopathy without gangrene: Secondary | ICD-10-CM | POA: Diagnosis present

## 2016-11-24 DIAGNOSIS — Z79899 Other long term (current) drug therapy: Secondary | ICD-10-CM

## 2016-11-24 DIAGNOSIS — Z9989 Dependence on other enabling machines and devices: Secondary | ICD-10-CM | POA: Diagnosis not present

## 2016-11-24 DIAGNOSIS — Q231 Congenital insufficiency of aortic valve: Secondary | ICD-10-CM | POA: Diagnosis not present

## 2016-11-24 DIAGNOSIS — E119 Type 2 diabetes mellitus without complications: Secondary | ICD-10-CM | POA: Diagnosis not present

## 2016-11-24 DIAGNOSIS — K9189 Other postprocedural complications and disorders of digestive system: Secondary | ICD-10-CM | POA: Diagnosis not present

## 2016-11-24 DIAGNOSIS — Z833 Family history of diabetes mellitus: Secondary | ICD-10-CM

## 2016-11-24 DIAGNOSIS — Z8249 Family history of ischemic heart disease and other diseases of the circulatory system: Secondary | ICD-10-CM | POA: Diagnosis not present

## 2016-11-24 DIAGNOSIS — K859 Acute pancreatitis without necrosis or infection, unspecified: Secondary | ICD-10-CM | POA: Insufficient documentation

## 2016-11-24 DIAGNOSIS — L03316 Cellulitis of umbilicus: Secondary | ICD-10-CM | POA: Diagnosis not present

## 2016-11-24 DIAGNOSIS — R1110 Vomiting, unspecified: Secondary | ICD-10-CM | POA: Diagnosis not present

## 2016-11-24 DIAGNOSIS — E1165 Type 2 diabetes mellitus with hyperglycemia: Secondary | ICD-10-CM | POA: Diagnosis not present

## 2016-11-24 DIAGNOSIS — Z952 Presence of prosthetic heart valve: Secondary | ICD-10-CM

## 2016-11-24 DIAGNOSIS — Z87442 Personal history of urinary calculi: Secondary | ICD-10-CM | POA: Diagnosis not present

## 2016-11-24 DIAGNOSIS — K8070 Calculus of gallbladder and bile duct without cholecystitis without obstruction: Secondary | ICD-10-CM | POA: Diagnosis not present

## 2016-11-24 DIAGNOSIS — Z882 Allergy status to sulfonamides status: Secondary | ICD-10-CM

## 2016-11-24 DIAGNOSIS — Z888 Allergy status to other drugs, medicaments and biological substances status: Secondary | ICD-10-CM

## 2016-11-24 DIAGNOSIS — Z7984 Long term (current) use of oral hypoglycemic drugs: Secondary | ICD-10-CM | POA: Diagnosis not present

## 2016-11-24 DIAGNOSIS — Y848 Other medical procedures as the cause of abnormal reaction of the patient, or of later complication, without mention of misadventure at the time of the procedure: Secondary | ICD-10-CM | POA: Diagnosis present

## 2016-11-24 DIAGNOSIS — Z9889 Other specified postprocedural states: Secondary | ICD-10-CM

## 2016-11-24 DIAGNOSIS — G4700 Insomnia, unspecified: Secondary | ICD-10-CM | POA: Diagnosis not present

## 2016-11-24 DIAGNOSIS — K8020 Calculus of gallbladder without cholecystitis without obstruction: Secondary | ICD-10-CM | POA: Diagnosis not present

## 2016-11-24 DIAGNOSIS — I2510 Atherosclerotic heart disease of native coronary artery without angina pectoris: Secondary | ICD-10-CM | POA: Diagnosis present

## 2016-11-24 DIAGNOSIS — K8062 Calculus of gallbladder and bile duct with acute cholecystitis without obstruction: Secondary | ICD-10-CM | POA: Diagnosis not present

## 2016-11-24 DIAGNOSIS — Z7982 Long term (current) use of aspirin: Secondary | ICD-10-CM

## 2016-11-24 DIAGNOSIS — K219 Gastro-esophageal reflux disease without esophagitis: Secondary | ICD-10-CM | POA: Diagnosis not present

## 2016-11-24 DIAGNOSIS — K115 Sialolithiasis: Secondary | ICD-10-CM | POA: Diagnosis not present

## 2016-11-24 DIAGNOSIS — K851 Biliary acute pancreatitis without necrosis or infection: Secondary | ICD-10-CM | POA: Diagnosis not present

## 2016-11-24 DIAGNOSIS — R932 Abnormal findings on diagnostic imaging of liver and biliary tract: Secondary | ICD-10-CM | POA: Diagnosis not present

## 2016-11-24 HISTORY — PX: REMOVAL OF STONES: SHX5545

## 2016-11-24 HISTORY — PX: ERCP: SHX5425

## 2016-11-24 HISTORY — PX: SPHINCTEROTOMY: SHX5544

## 2016-11-24 LAB — CBC
HEMATOCRIT: 39.3 % (ref 39.0–52.0)
HEMOGLOBIN: 13.2 g/dL (ref 13.0–17.0)
MCH: 32 pg (ref 26.0–34.0)
MCHC: 33.6 g/dL (ref 30.0–36.0)
MCV: 95.2 fL (ref 78.0–100.0)
Platelets: 135 10*3/uL — ABNORMAL LOW (ref 150–400)
RBC: 4.13 MIL/uL — ABNORMAL LOW (ref 4.22–5.81)
RDW: 13.3 % (ref 11.5–15.5)
WBC: 9 10*3/uL (ref 4.0–10.5)

## 2016-11-24 LAB — GLUCOSE, CAPILLARY
Glucose-Capillary: 110 mg/dL — ABNORMAL HIGH (ref 65–99)
Glucose-Capillary: 196 mg/dL — ABNORMAL HIGH (ref 65–99)
Glucose-Capillary: 97 mg/dL (ref 65–99)
Glucose-Capillary: 104 mg/dL — ABNORMAL HIGH (ref 65–99)

## 2016-11-24 LAB — COMPREHENSIVE METABOLIC PANEL
ALBUMIN: 3.6 g/dL (ref 3.5–5.0)
ALK PHOS: 55 U/L (ref 38–126)
ALT: 48 U/L (ref 17–63)
ANION GAP: 10 (ref 5–15)
AST: 55 U/L — ABNORMAL HIGH (ref 15–41)
BILIRUBIN TOTAL: 0.8 mg/dL (ref 0.3–1.2)
BUN: 14 mg/dL (ref 6–20)
CALCIUM: 8.1 mg/dL — AB (ref 8.9–10.3)
CO2: 22 mmol/L (ref 22–32)
Chloride: 106 mmol/L (ref 101–111)
Creatinine, Ser: 0.94 mg/dL (ref 0.61–1.24)
GFR calc Af Amer: >60 mL/min (ref >60)
GLUCOSE: 128 mg/dL — AB (ref 65–99)
Potassium: 3.5 mmol/L (ref 3.5–5.1)
Sodium: 138 mmol/L (ref 135–145)
TOTAL PROTEIN: 6.5 g/dL (ref 6.5–8.1)

## 2016-11-24 LAB — AMYLASE: AMYLASE: 613 U/L — AB (ref 28–100)

## 2016-11-24 SURGERY — ERCP, WITH INTERVENTION IF INDICATED
Anesthesia: General

## 2016-11-24 MED ORDER — CEFAZOLIN SODIUM-DEXTROSE 2-4 GM/100ML-% IV SOLN
2.0000 g | INTRAVENOUS | Status: AC
  Administered 2016-11-24: 2 g via INTRAVENOUS
  Filled 2016-11-24: qty 100

## 2016-11-24 MED ORDER — IOPAMIDOL (ISOVUE-300) INJECTION 61%
INTRAVENOUS | Status: AC
  Filled 2016-11-24: qty 100

## 2016-11-24 MED ORDER — ROCURONIUM BROMIDE 50 MG/5ML IV SOLN
INTRAVENOUS | Status: AC
  Filled 2016-11-24: qty 1

## 2016-11-24 MED ORDER — CHLORHEXIDINE GLUCONATE CLOTH 2 % EX PADS
6.0000 | MEDICATED_PAD | Freq: Once | CUTANEOUS | Status: DC
Start: 2016-11-24 — End: 2016-11-24

## 2016-11-24 MED ORDER — HYDROMORPHONE HCL 1 MG/ML IJ SOLN
0.5000 mg | Freq: Once | INTRAMUSCULAR | Status: AC
  Administered 2016-11-24: 0.5 mg via INTRAVENOUS

## 2016-11-24 MED ORDER — PROPOFOL 10 MG/ML IV BOLUS
INTRAVENOUS | Status: DC | PRN
  Administered 2016-11-24: 140 mg via INTRAVENOUS

## 2016-11-24 MED ORDER — SUCCINYLCHOLINE CHLORIDE 20 MG/ML IJ SOLN
INTRAMUSCULAR | Status: DC | PRN
  Administered 2016-11-24: 160 mg via INTRAVENOUS

## 2016-11-24 MED ORDER — SODIUM CHLORIDE 0.9 % IJ SOLN
INTRAMUSCULAR | Status: AC
  Filled 2016-11-24: qty 10

## 2016-11-24 MED ORDER — STERILE WATER FOR IRRIGATION IR SOLN
Status: DC | PRN
  Administered 2016-11-24: 1000 mL

## 2016-11-24 MED ORDER — HYDROMORPHONE HCL 1 MG/ML IJ SOLN
INTRAMUSCULAR | Status: AC
  Filled 2016-11-24: qty 1

## 2016-11-24 MED ORDER — FENTANYL CITRATE (PF) 100 MCG/2ML IJ SOLN
INTRAMUSCULAR | Status: AC
  Filled 2016-11-24: qty 2

## 2016-11-24 MED ORDER — ONDANSETRON HCL 4 MG/2ML IJ SOLN
INTRAMUSCULAR | Status: AC
  Filled 2016-11-24: qty 2

## 2016-11-24 MED ORDER — ROCURONIUM BROMIDE 100 MG/10ML IV SOLN
INTRAVENOUS | Status: DC | PRN
  Administered 2016-11-24: 5 mg via INTRAVENOUS
  Administered 2016-11-24: 15 mg via INTRAVENOUS
  Administered 2016-11-24: 10 mg via INTRAVENOUS

## 2016-11-24 MED ORDER — CHLORHEXIDINE GLUCONATE CLOTH 2 % EX PADS: 6.0000 | MEDICATED_PAD | Freq: Once | CUTANEOUS | Status: DC

## 2016-11-24 MED ORDER — CIPROFLOXACIN IN D5W 400 MG/200ML IV SOLN
400.0000 mg | Freq: Two times a day (BID) | INTRAVENOUS | Status: DC
  Administered 2016-11-24 – 2016-11-25 (×2): 400 mg via INTRAVENOUS
  Filled 2016-11-24 (×2): qty 200

## 2016-11-24 MED ORDER — EPHEDRINE SULFATE 50 MG/ML IJ SOLN
INTRAMUSCULAR | Status: AC
  Filled 2016-11-24: qty 1

## 2016-11-24 MED ORDER — MIDAZOLAM HCL 2 MG/2ML IJ SOLN
1.0000 mg | INTRAMUSCULAR | Status: AC
  Administered 2016-11-24: 2 mg via INTRAVENOUS
  Filled 2016-11-24: qty 2

## 2016-11-24 MED ORDER — GLYCOPYRROLATE 0.2 MG/ML IJ SOLN
INTRAMUSCULAR | Status: DC | PRN
  Administered 2016-11-24: 0.6 mg via INTRAVENOUS

## 2016-11-24 MED ORDER — CEFAZOLIN SODIUM-DEXTROSE 2-4 GM/100ML-% IV SOLN
INTRAVENOUS | Status: AC
  Filled 2016-11-24: qty 100

## 2016-11-24 MED ORDER — SUCCINYLCHOLINE CHLORIDE 20 MG/ML IJ SOLN
INTRAMUSCULAR | Status: AC
  Filled 2016-11-24: qty 1

## 2016-11-24 MED ORDER — GLUCAGON HCL RDNA (DIAGNOSTIC) 1 MG IJ SOLR
INTRAMUSCULAR | Status: AC
Start: 2016-11-24 — End: 2016-11-24
  Filled 2016-11-24: qty 2

## 2016-11-24 MED ORDER — ACETAMINOPHEN 325 MG PO TABS: 650.0000 mg | ORAL_TABLET | Freq: Four times a day (QID) | ORAL | Status: DC | PRN

## 2016-11-24 MED ORDER — METRONIDAZOLE IN NACL 5-0.79 MG/ML-% IV SOLN
500.0000 mg | Freq: Four times a day (QID) | INTRAVENOUS | Status: DC
  Administered 2016-11-24 – 2016-11-27 (×13): 500 mg via INTRAVENOUS
  Filled 2016-11-24 (×13): qty 100

## 2016-11-24 MED ORDER — LIDOCAINE HCL (PF) 1 % IJ SOLN
INTRAMUSCULAR | Status: AC
  Filled 2016-11-24: qty 5

## 2016-11-24 MED ORDER — ONDANSETRON HCL 4 MG/2ML IJ SOLN
4.0000 mg | Freq: Once | INTRAMUSCULAR | Status: AC
  Administered 2016-11-24: 4 mg via INTRAVENOUS

## 2016-11-24 MED ORDER — INSULIN ASPART 100 UNIT/ML ~~LOC~~ SOLN
0.0000 [IU] | Freq: Three times a day (TID) | SUBCUTANEOUS | Status: DC
  Administered 2016-11-26: 3 [IU] via SUBCUTANEOUS
  Administered 2016-11-26 – 2016-11-27 (×4): 2 [IU] via SUBCUTANEOUS
  Filled 2016-11-24: qty 0.15

## 2016-11-24 MED ORDER — LIDOCAINE VISCOUS 2 % MT SOLN: 5.0000 mL | Freq: Once | OROMUCOSAL | Status: DC

## 2016-11-24 MED ORDER — NEOSTIGMINE METHYLSULFATE 10 MG/10ML IV SOLN
INTRAVENOUS | Status: DC | PRN
  Administered 2016-11-24 (×2): 2 mg via INTRAVENOUS

## 2016-11-24 MED ORDER — MIDAZOLAM HCL 5 MG/5ML IJ SOLN
INTRAMUSCULAR | Status: DC | PRN
  Administered 2016-11-24: 2 mg via INTRAVENOUS

## 2016-11-24 MED ORDER — PANTOPRAZOLE SODIUM 40 MG IV SOLR
40.0000 mg | Freq: Two times a day (BID) | INTRAVENOUS | Status: DC
  Administered 2016-11-24 – 2016-11-27 (×6): 40 mg via INTRAVENOUS
  Filled 2016-11-24 (×6): qty 40

## 2016-11-24 MED ORDER — HYDROMORPHONE HCL 1 MG/ML IJ SOLN
1.0000 mg | INTRAMUSCULAR | Status: DC | PRN
Start: 2016-11-24 — End: 2016-11-27
  Administered 2016-11-24 – 2016-11-27 (×13): 1 mg via INTRAVENOUS
  Filled 2016-11-24 (×13): qty 1

## 2016-11-24 MED ORDER — LIDOCAINE VISCOUS 2 % MT SOLN
OROMUCOSAL | Status: AC
  Filled 2016-11-24: qty 15

## 2016-11-24 MED ORDER — ACETAMINOPHEN 650 MG RE SUPP
650.0000 mg | Freq: Four times a day (QID) | RECTAL | Status: DC | PRN
  Filled 2016-11-24: qty 1

## 2016-11-24 MED ORDER — LIDOCAINE HCL (CARDIAC) 10 MG/ML IV SOLN
INTRAVENOUS | Status: DC | PRN
  Administered 2016-11-24: 30 mg via INTRAVENOUS

## 2016-11-24 MED ORDER — GLUCAGON HCL RDNA (DIAGNOSTIC) 1 MG IJ SOLR
INTRAMUSCULAR | Status: DC | PRN
  Administered 2016-11-24 (×4): 0.25 mg via INTRAVENOUS

## 2016-11-24 MED ORDER — INDOMETHACIN 50 MG RE SUPP
100.0000 mg | Freq: Once | RECTAL | Status: AC
  Administered 2016-11-24: 100 mg via RECTAL

## 2016-11-24 MED ORDER — IOPAMIDOL (ISOVUE-M 300) INJECTION 61%
INTRAMUSCULAR | Status: DC | PRN
  Administered 2016-11-24: 50 mL

## 2016-11-24 MED ORDER — POTASSIUM CHLORIDE IN NACL 20-0.45 MEQ/L-% IV SOLN
INTRAVENOUS | Status: DC
  Administered 2016-11-24: 20:00:00 via INTRAVENOUS
  Filled 2016-11-24 (×3): qty 1000

## 2016-11-24 MED ORDER — MIDAZOLAM HCL 2 MG/2ML IJ SOLN
INTRAMUSCULAR | Status: AC
  Filled 2016-11-24: qty 2

## 2016-11-24 MED ORDER — INDOMETHACIN 50 MG RE SUPP
RECTAL | Status: AC
  Filled 2016-11-24: qty 2

## 2016-11-24 MED ORDER — FENTANYL CITRATE (PF) 100 MCG/2ML IJ SOLN
INTRAMUSCULAR | Status: DC | PRN
  Administered 2016-11-24 (×2): 25 ug via INTRAVENOUS
  Administered 2016-11-24: 50 ug via INTRAVENOUS

## 2016-11-24 MED ORDER — LACTATED RINGERS IV SOLN
INTRAVENOUS | Status: DC
  Administered 2016-11-24: 12:00:00 via INTRAVENOUS

## 2016-11-24 MED ORDER — PROPOFOL 10 MG/ML IV BOLUS
INTRAVENOUS | Status: AC
  Filled 2016-11-24: qty 20

## 2016-11-24 NOTE — H&P (Signed)
Primary Care Physician:  Ernestina PennaMoore, Donald W, MD Primary Gastroenterologist:  Dr. Karilyn Cotaehman  Presenting complaint:  Right upper quadrant abdominal pain status post ERCP.   History & Physical: Patient is 61 year old Caucasian male who [facility] quadrant abdominal pain on 11/21/2016. He thought he had kidney stones. Abdominopelvic CT did not reveal any abnormality to account for his pain. However ultrasound revealed cholelithiasis. Patient underwent laparoscopic cholecystectomy by Dr.Cathey yesterday. Intraoperative cholangiogram was obtained and revealed 3 stones in CBD. Patient was therefore referred for therapeutic ERCP. ERCP revealed few small filling defects in distal most segment of CBD. Sphincterotomy was performed with removal of sludge in 2 small stones. Pancreatic duct was not cannulated or filled with contrast. There was no extravasation of contrast. In PACU patient complained of right upper quadrant abdominal pain which she described to be new pain. He was given 0.5 mg of hydromorphone with some relief. Examination revealed him to be tender in this area. Lane abdominal film revealed air in the stomach and predominantly large bowel but no free air.    Past Medical History:  Diagnosis Date  . Aortic stenosis 8/09   Bicuspid aortic valve  . Arthritis 6/10   hand   . Atrial fibrillation (HCC)    post op only  . CAD (coronary artery disease) 8/09  . CTS (carpal tunnel syndrome)    bilateral   . Diastolic dysfunction 8/09  . Elevated LFTs   . Fatty liver disease, nonalcoholic   . Hyperlipidemia   . Hypertension   . Kidney stones   . Nasal congestion   . NIDDM (non-insulin dependent diabetes mellitus)    x15 yrs  . OSA on CPAP 2006  . Paresthesias in left hand 02/19/2004  . Paresthesias in right hand 02/19/2004  . Sinusitis   . Vitamin D deficiency 06/13/09    Past Surgical History:  Procedure Laterality Date  . CARPAL TUNNEL RELEASE     bilateral   . CORONARY ANGIOPLASTY WITH STENT PLACEMENT     to rt coronary atery (Dr. Degert-cardiologist)  . TISSUE AORTIC VALVE REPLACEMENT     2012    Prior to Admission medications   Medication Sig Start Date End Date Taking? Authorizing Provider  ANDROGEL PUMP 20.25 MG/ACT (1.62%) GEL APPLY 3 PUMPS TOPICALLY EVERY DAY - PDM COUPON ON FILE - JB 04/23/2016 06/24/16  Yes Ernestina PennaMoore, Donald W, MD  aspirin (SB LOW DOSE ASA EC) 81 MG EC tablet Take 81 mg by mouth daily.     Yes [provider]  atenolol (TENORMIN) 25 MG tablet TAKE 1 TABLET DAILY 12/16/15  Yes Antoine PocheBranch, Jonathan F, MD  atorvastatin (LIPITOR) 40 MG tablet TAKE 1 TABLET DAILY 07/21/16  Yes Ernestina PennaMoore, Donald W, MD  cholecalciferol (VITAMIN D) 1000 UNITS tablet Take 2,000 Units by mouth daily.    Yes [provider]  empagliflozin (JARDIANCE) 25 MG TABS tablet TAKE 1 TABLET BY MOUTH EVERY DAY (MCK CARD) 11/04/16  Yes Ernestina PennaMoore, Donald W, MD  fluticasone [facility](FLONASE) 50 MCG/ACT nasal spray Place 2 sprays into the nose daily as needed. 12/12/12  Yes Ernestina PennaMoore, Donald W, MD  glipiZIDE (GLUCOTROL) 5 MG tablet TAKE 1 TABLET TWICE A DAY BEFORE A MEAL (DISCONTINUE NATEGLINIDE/STARLIX) 11/02/16  Yes Ernestina PennaMoore, Donald W, MD  guaiFENesin (MUCINEX) 600 MG 12 hr tablet Take by mouth 2 (two) times daily.   Yes [provider]  lisinopril (PRINIVIL,ZESTRIL) 20 MG tablet TAKE 1 TABLET DAILY 10/19/16  Yes Ernestina PennaMoore, Donald W, MD  metFORMIN (GLUCOPHAGE) 1000 MG tablet TAKE 1 TABLET TWICE DAILY WITH MEALS 08/17/16  Yes Ernestina Penna, MD  promethazine (PHENERGAN) 25 MG tablet TAKE 1 TABLET EVERY 4 HOURS AS NEEDED 02/04/16  Yes Ernestina Penna, MD  ranitidine (ZANTAC) 150 MG tablet Take 150 mg by mouth 2 (two) times daily.    Yes [provider]  temazepam (RESTORIL) 15 MG capsule TAKE 1 TABLET BY MOUTH AT BEDTIME AS NEEDED 03/10/16  Yes Dettinger, [name], MD    Allergies  Allergen Reactions  . Hctz [Hydrochlorothiazide]     Nausea and headache   .  Sulfa Antibiotics   . Sulfonamide Derivatives     Family History  Problem Relation Age of Onset  . Hearing loss Mother   . Alzheimer's disease Mother   . Hearing loss Father   . Diabetes Father   . Diabetes Unknown        Family History     Social History   Social History  . Marital status: Married    Spouse name: N/A  . Number of children: N/A  . Years of education: N/A   Occupational History  . Not on file.   Social History Main Topics  . Smoking status: Former Smoker    Packs/day: 1.50    Years: 25.00    Types: Cigarettes    Start date: 07/06/1974    Quit date: 07/06/2002  . Smokeless tobacco: Never Used  . Alcohol use No  . Drug use: No  . Sexual activity: Not on file   Other Topics Concern  . Not on file   Social History Narrative  . No narrative on file    Review of Systems: See HPI, otherwise negative ROS  Physical Exam: BP (!) 141/76   Pulse 60   Temp 97.5 F (36.4 C)   Resp 10   Ht 5\' 10"  (1.778 m)   Wt 205 lb (93 kg)   SpO2 95%   BMI 29.41 kg/m  Patient is alert and appears to be in some pain. Conjunctiva was pink. Sclerae nonicteric. Oropharyngeal mucosa is normal. No neck masses or thyromegaly noted. Cardiac exam with regular rhythm normal S1 and S2. No murmur noted. Lungs are clear to auscultation. Abdomen is full with laparoscopy wounds. Bowel sounds are normal. On palpation abdomen is soft with moderate tenderness below the right costal margin. No organomegaly or masses. No peripheral edema or clubbing noted.   Impression/Plan:  Patient has developed right upper quadrant abdominal pain following ERCP with sphincterotomy. No extravasation of contrast noted at the time of procedure. Plain abdominal film negative for pneumoperitoneum. Differential diagnosis includes post-ERCP pancreatitis or microperforation. Pain not completely relieved with hydromorphone. Patient will be admitted for pain control and further evaluation starting with  blood work and serum amylase.Patient will be nothing by mouth. Will do CBG monitoring and sliding scale coverage 4 times a day. Will also start him on Cipro and metronidazole IV. May consider a Gastrografin study in a.m. or CT depending on clinical course. Mechanical DVT prophylaxis.

## 2016-11-24 NOTE — Anesthesia Postprocedure Evaluation (Signed)
Anesthesia Post Note  Patient: [name] LabradorJames M [name]  Procedure(s) Performed: Procedure(s) (LRB): ENDOSCOPIC RETROGRADE CHOLANGIOPANCREATOGRAPHY (ERCP) (N/A) SPHINCTEROTOMY (N/A) REMOVAL OF STONES (N/A)  Patient location during evaluation: PACU Anesthesia Type: General Level of consciousness: awake and patient cooperative Pain management: pain level controlled Vital Signs Assessment: post-procedure vital signs reviewed and stable Respiratory status: spontaneous breathing, nonlabored ventilation and respiratory function stable Cardiovascular status: blood pressure returned to baseline Postop Assessment: no signs of nausea or vomiting Anesthetic complications: no     Last Vitals:  Vitals:   11/24/16 1300 11/24/16 1500  BP: 132/64   Pulse:  88  Resp: (!) 63 (!) 22  Temp:  36.4 C    Last Pain:  Vitals:   11/24/16 1146  TempSrc: Oral                 Carrine Kroboth J

## 2016-11-24 NOTE — Anesthesia Preprocedure Evaluation (Signed)
Anesthesia Evaluation  Patient identified by MRN, date of birth, ID band Patient awake    Reviewed: Allergy & Precautions, NPO status , Patient's Chart, lab work & pertinent test results  Airway Mallampati: II  TM Distance: >3 FB Neck ROM: Full    Dental  (+) Teeth Intact   Pulmonary sleep apnea and Continuous Positive Airway Pressure Ventilation , former smoker,    breath sounds clear to auscultation       Cardiovascular hypertension, + CAD, + Peripheral Vascular Disease and + DOE  + dysrhythmias (NSR now) Atrial Fibrillation + Valvular Problems/Murmurs (s/p AVR) AS  Rhythm:Regular Rate:Normal     Neuro/Psych  Neuromuscular disease    GI/Hepatic GERD  Controlled and Medicated,  Endo/Other  diabetes, Type 2, Oral Hypoglycemic Agents  Renal/GU Renal disease     Musculoskeletal   Abdominal   Peds  Hematology   Anesthesia Other Findings   Reproductive/Obstetrics                             Anesthesia Physical Anesthesia Plan  ASA: III  Anesthesia Plan: General   Post-op Pain Management:    Induction: Intravenous, Rapid sequence and Cricoid pressure planned  Airway Management Planned: Oral ETT  Additional Equipment:   Intra-op Plan:   Post-operative Plan: Extubation in OR  Informed Consent: I have reviewed the patients History and Physical, chart, labs and discussed the procedure including the risks, benefits and alternatives for the proposed anesthesia with the patient or authorized representative who has indicated his/her understanding and acceptance.     Plan Discussed with:   Anesthesia Plan Comments:         Anesthesia Quick Evaluation

## 2016-11-24 NOTE — H&P (Signed)
[name] [name] is an 61 y.o. male.   Chief Complaint: Patient is here for ERCP with sphincterotomy and stone extraction. HPI: Patient is 61 year old Caucasian male who was admitted to [facility] [place] with acute onset of right upper quadrant abdominal pain and he was diagnosed with gallbladder disease. He underwent laparoscopic cholecystectomy yesterday along with intraoperative cholangiogram revealing 3 stones. He is therefore referred for therapeutic ERCP by Dr. Marcha [name]. Patient denies sharp pain. There is no history of anorexia and nausea vomiting. He does complain of intermittent lower abdominal discomfort then the birthing for which he was seen her office last year.  Intraoperative cholangiogram reviewed. It reveals normal caliber to CBD with 3 stones in midsection.  Past Medical History:  Diagnosis Date  . Aortic stenosis 8/09   Bicuspid aortic valve  . Arthritis 6/10   hand   . Atrial fibrillation (HCC)    post op only  . CAD (coronary artery disease) 8/09  . CTS (carpal tunnel syndrome)    bilateral   . Diastolic dysfunction 8/09  . Elevated LFTs   . Fatty liver disease, nonalcoholic   . Hyperlipidemia   . Hypertension   . Kidney stones   . Nasal congestion   . NIDDM (non-insulin dependent diabetes mellitus)    x15 yrs  . OSA on CPAP 2006  . Paresthesias in left hand 02/19/2004  . Paresthesias in right hand 02/19/2004  . Sinusitis   . Vitamin D deficiency 06/13/09    Past Surgical History:  Procedure Laterality Date  . CARPAL TUNNEL RELEASE     bilateral   . CORONARY ANGIOPLASTY WITH STENT PLACEMENT     to rt coronary atery (Dr. Degert-cardiologist)  . TISSUE AORTIC VALVE REPLACEMENT     2012    Family History  Problem Relation Age of Onset  . Hearing loss Mother   . Alzheimer's disease Mother   . Hearing loss Father   . Diabetes Father   . Diabetes Unknown        Family History    Social History:  reports that he quit smoking about 14 years ago. His  smoking use included Cigarettes. He started smoking about 42 years ago. He has a 37.50 pack-year smoking history. He has never used smokeless tobacco. He reports that he does not drink alcohol or use drugs.  Allergies:  Allergies  Allergen Reactions  . Hctz [Hydrochlorothiazide]     Nausea and headache   . Sulfa Antibiotics   . Sulfonamide Derivatives     Medications Prior to Admission  Medication Sig Dispense Refill  . ANDROGEL PUMP 20.25 MG/ACT (1.62%) GEL APPLY 3 PUMPS TOPICALLY EVERY DAY - PDM COUPON ON FILE - JB 04/23/2016 75 g 2  . aspirin (SB LOW DOSE ASA EC) 81 MG EC tablet Take 81 mg by mouth daily.      [name] atenolol (TENORMIN) 25 MG tablet TAKE 1 TABLET DAILY 90 tablet 3  . atorvastatin (LIPITOR) 40 MG tablet TAKE 1 TABLET DAILY 90 tablet 1  . cholecalciferol (VITAMIN D) 1000 UNITS tablet Take 2,000 Units by mouth daily.     . empagliflozin (JARDIANCE) 25 MG TABS tablet TAKE 1 TABLET BY MOUTH EVERY DAY (MCK CARD) 30 tablet 1  . fluticasone (FLONASE) 50 MCG/ACT nasal spray Place 2 sprays into the nose daily as needed.    [name] glipiZIDE (GLUCOTROL) 5 MG tablet TAKE 1 TABLET TWICE A DAY BEFORE A MEAL (DISCONTINUE NATEGLINIDE/STARLIX) 180 tablet 1  . guaiFENesin (MUCINEX) 600 MG  12 hr tablet Take by mouth 2 (two) times daily.    [name]. lisinopril (PRINIVIL,ZESTRIL) 20 MG tablet TAKE 1 TABLET DAILY 90 tablet 1  . metFORMIN (GLUCOPHAGE) 1000 MG tablet TAKE 1 TABLET TWICE DAILY WITH MEALS 180 tablet 1  . promethazine (PHENERGAN) 25 MG tablet TAKE 1 TABLET EVERY 4 HOURS AS NEEDED 30 tablet 1  . ranitidine (ZANTAC) 150 MG tablet Take 150 mg by mouth 2 (two) times daily.     . temazepam (RESTORIL) 15 MG capsule TAKE 1 TABLET BY MOUTH AT BEDTIME AS NEEDED 30 capsule 2    Results for orders placed or performed during the hospital encounter of 11/24/16 (from the past 48 hour(s))  Glucose, capillary     Status: Abnormal   Collection Time: 11/24/16 11:53 AM  Result Value Ref Range    Glucose-Capillary 110 (H) 65 - 99 mg/dL   No results found.  ROS  Blood pressure 132/64, pulse 69, temperature 98 F (36.7 C), temperature source Oral, resp. rate (!) 63, height 5\' 10"  (1.778 m), weight 205 lb (93 kg), SpO2 (!) 89 %. Physical Exam  Constitutional: He appears well-developed and well-nourished.  HENT:  Mouth/Throat: Oropharynx is clear and moist.  Eyes: Conjunctivae are normal. No scleral icterus.  Neck: No thyromegaly present.  Cardiovascular: Normal rate, regular rhythm and normal heart sounds.   No murmur heard. Respiratory: Effort normal and breath sounds normal.  Midsternal scar.  GI:  Abdomen is full with laparoscopy wounds. Achymosis noted around umbilicus; abdomen is soft and nontender without organomegaly or masses.  Musculoskeletal: He exhibits no edema.  Neurological: He is alert.  Skin: Skin is warm and dry.     Assessment/Plan Choledocholithiasis discovered at time of laparoscopic cholecystectomy yesterday. ERCP with sphincterotomy and stone extraction. Procedure risks reviewed with the patient and his wife and they're both agreeable.  [name] DecemberNajeeb Rehman, MD 11/24/2016, 1:04 PM

## 2016-11-24 NOTE — Op Note (Signed)
[facility] Patient Name: [name] [name] Procedure Date: 11/24/2016 1:35 PM MRN: [id number] Date of Birth: 29-Nov-1955 Attending MD: Lionel December , MD CSN: [id number] Age: 61 Admit Type: Outpatient Procedure:                ERCP Indications:              For therapy of bile duct stone(s) Providers:                Lionel December, MD, Edrick Kins, RN, Burke Keels, Technician Referring MD:             Fraser Din, MD Medicines:                General Anesthesia Complications:            No immediate complications. Estimated blood loss:                            Minimal. Estimated Blood Loss:     Estimated blood loss was minimal. Procedure:                Pre-Anesthesia Assessment:                           - Prior to the procedure, a History and Physical                            was performed, and patient medications and                            allergies were reviewed. The patient's tolerance of                            previous anesthesia was also reviewed. The risks                            and benefits of the procedure and the sedation                            options and risks were discussed with the patient.                            All questions were answered, and informed consent                            was obtained. Prior Anticoagulants: The patient                            last took aspirin 2 days prior to the procedure.                            ASA Grade Assessment: II - A patient with mild  systemic disease. After reviewing the risks and                            benefits, the patient was deemed in satisfactory                            condition to undergo the procedure.                           After obtaining informed consent, the scope was                            passed under direct vision. Throughout the                            procedure, the patient's blood pressure, pulse, and                             oxygen saturations were monitored continuously. The                            [id number] ([id number]) scope was introduced through                            the mouth, and used to inject contrast into and                            used to inject contrast into the bile duct. The                            ERCP was accomplished without difficulty. The                            patient tolerated the procedure well. Scope In: 1:39:55 PM Scope Out: 2:32:04 PM Total Procedure Duration: 0 hours 52 minutes 9 seconds  Findings:      The scout film was normal. The esophagus was successfully intubated       under direct vision. The scope was advanced to a normal major papilla in       the descending duodenum without detailed examination of the pharynx,       larynx and associated structures, and upper GI tract. The upper GI tract       was grossly normal. The bile duct was deeply cannulated with the       Autotome sphincterotome. Contrast was injected. I personally interpreted       the bile duct images. There was brisk flow of contrast through the       ducts. Image quality was excellent. Contrast extended to the main bile       duct. Contrast extended to the bifurcation. Contrast extended to the       hepatic ducts. The lower third of the main bile duct and common bile       duct contained filling defect(s) thought to be a stone.       Choledocholithiasis was found in a nondilated duct. An 8 mm biliary  sphincterotomy was made with a braided traction (standard)       sphincterotome using ERBE electrocautery. The sphincterotomy oozed       blood. The biliary tree was swept with a basket starting at the upper       third of the main bile duct, middle third of the main bile duct and       lower third of the main duct. Two stones were removed. No stones       remained. Impression:               - Filling defects consistent with stones was seen                             on the cholangiogram.                           - Choledocholithiasis was found. Complete removal                            was accomplished by biliary sphincterotomy and                            basket extraction and dormia basket.                           - A biliary sphincterotomy was performed.                           - The biliary tree was swept with dormia basket and                            balloon stone extractor.                           - Clip application at roof of papilla attempted but                            not possble.                           Comment: KUB in PACU did not reveal                            pneumoperitoneum. Moderate Sedation:      Per Anesthesia Care Recommendation:           - Avoid aspirin and nonsteroidal anti-inflammatory                            medicines for 3 days.                           - Admit patient for pain control and further                            evaluation. Procedure Code(s):        --- Professional ---  [phone], Endoscopic retrograde                            cholangiopancreatography (ERCP); with removal of                            calculi/debris from biliary/pancreatic duct(s)                           43262, Endoscopic retrograde                            cholangiopancreatography (ERCP); with                            sphincterotomy/papillotomy Diagnosis Code(s):        --- Professional ---                           K80.50, Calculus of bile duct without cholangitis                            or cholecystitis without obstruction                           R93.2, Abnormal findings on diagnostic imaging of                            liver and biliary tract CPT copyright 2016 American Medical Association. All rights reserved. The codes documented in this report are preliminary and upon coder review may  be revised to meet current compliance requirements. Lionel December, MD Lionel December,  MD 11/24/2016 4:22:00 PM This report has been signed electronically. Number of Addenda: 0

## 2016-11-24 NOTE — Telephone Encounter (Signed)
ERCP sch'd for today

## 2016-11-24 NOTE — Telephone Encounter (Signed)
Talked with Dr. Jerrell Mylarathay Will schedule ERCP either today or tomorrow.

## 2016-11-24 NOTE — Anesthesia Procedure Notes (Signed)
Procedure Name: Intubation Date/Time: 11/24/2016 1:24 PM Performed by: Charmaine Downs Pre-anesthesia Checklist: Patient identified, Patient being monitored, Timeout performed, Emergency Drugs available and Suction available Patient Re-evaluated:Patient Re-evaluated prior to inductionOxygen Delivery Method: Circle System Utilized Preoxygenation: Pre-oxygenation with 100% oxygen Intubation Type: IV induction, Rapid sequence and Cricoid Pressure applied Ventilation: Mask ventilation without difficulty Laryngoscope Size: Mac and 3 Grade View: Grade II Tube type: Oral Tube size: 7.0 mm Number of attempts: 1 Airway Equipment and Method: stylet Placement Confirmation: ETT inserted through vocal cords under direct vision,  positive ETCO2 and breath sounds checked- equal and bilateral Secured at: 22 cm Tube secured with: Tape Dental Injury: Teeth and Oropharynx as per pre-operative assessment

## 2016-11-24 NOTE — Transfer of Care (Signed)
Immediate Anesthesia Transfer of Care Note  Patient: Maryclare LabradorJames M Kendall  Procedure(s) Performed: Procedure(s): ENDOSCOPIC RETROGRADE CHOLANGIOPANCREATOGRAPHY (ERCP) (N/A) SPHINCTEROTOMY (N/A) REMOVAL OF STONES (N/A)  Patient Location: PACU  Anesthesia Type:General  Level of Consciousness: confused  Airway & Oxygen Therapy: Patient Spontanous Breathing and Patient connected to face mask oxygen  Post-op Assessment: Report given to RN, Post -op Vital signs reviewed and stable and Patient moving all extremities  Post vital signs: Reviewed and stable  Last Vitals:  Vitals:   11/24/16 1255 11/24/16 1300  BP:  132/64  Pulse:    Resp: (!) 60 (!) 63  Temp:      Last Pain:  Vitals:   11/24/16 1146  TempSrc: Oral         Complications: No apparent anesthesia complications

## 2016-11-25 ENCOUNTER — Inpatient Hospital Stay (HOSPITAL_COMMUNITY): Payer: BLUE CROSS/BLUE SHIELD

## 2016-11-25 DIAGNOSIS — L03316 Cellulitis of umbilicus: Secondary | ICD-10-CM

## 2016-11-25 DIAGNOSIS — K9189 Other postprocedural complications and disorders of digestive system: Secondary | ICD-10-CM

## 2016-11-25 DIAGNOSIS — R109 Unspecified abdominal pain: Secondary | ICD-10-CM

## 2016-11-25 LAB — COMPREHENSIVE METABOLIC PANEL
ALK PHOS: 53 U/L (ref 38–126)
ALT: 36 U/L (ref 17–63)
AST: 36 U/L (ref 15–41)
Albumin: 3.2 g/dL — ABNORMAL LOW (ref 3.5–5.0)
Anion gap: 9 (ref 5–15)
BUN: 10 mg/dL (ref 6–20)
CALCIUM: 8 mg/dL — AB (ref 8.9–10.3)
CHLORIDE: 108 mmol/L (ref 101–111)
CO2: 23 mmol/L (ref 22–32)
CREATININE: 0.81 mg/dL (ref 0.61–1.24)
Glucose, Bld: 92 mg/dL (ref 65–99)
Potassium: 3.7 mmol/L (ref 3.5–5.1)
Sodium: 140 mmol/L (ref 135–145)
Total Bilirubin: 0.7 mg/dL (ref 0.3–1.2)
Total Protein: 5.8 g/dL — ABNORMAL LOW (ref 6.5–8.1)

## 2016-11-25 LAB — CBC WITH DIFFERENTIAL/PLATELET
Basophils Absolute: 0 10*3/uL (ref 0.0–0.1)
Basophils Relative: 0 %
EOS ABS: 0.1 10*3/uL (ref 0.0–0.7)
Eosinophils Relative: 1 %
HEMATOCRIT: 37.5 % — AB (ref 39.0–52.0)
HEMOGLOBIN: 12.3 g/dL — AB (ref 13.0–17.0)
LYMPHS PCT: 12 %
Lymphs Abs: 1 10*3/uL (ref 0.7–4.0)
MCH: 31.5 pg (ref 26.0–34.0)
MCHC: 32.8 g/dL (ref 30.0–36.0)
MCV: 95.9 fL (ref 78.0–100.0)
MONOS PCT: 7 %
Monocytes Absolute: 0.5 10*3/uL (ref 0.1–1.0)
NEUTROS PCT: 80 %
Neutro Abs: 6.2 10*3/uL (ref 1.7–7.7)
Platelets: 137 10*3/uL — ABNORMAL LOW (ref 150–400)
RBC: 3.91 MIL/uL — AB (ref 4.22–5.81)
RDW: 13.5 % (ref 11.5–15.5)
WBC: 7.8 10*3/uL (ref 4.0–10.5)

## 2016-11-25 LAB — GLUCOSE, CAPILLARY
GLUCOSE-CAPILLARY: 107 mg/dL — AB (ref 65–99)
GLUCOSE-CAPILLARY: 85 mg/dL (ref 65–99)
GLUCOSE-CAPILLARY: 101 mg/dL — AB (ref 65–99)
Glucose-Capillary: 127 mg/dL — ABNORMAL HIGH (ref 65–99)

## 2016-11-25 LAB — AMYLASE: Amylase: 305 U/L — ABNORMAL HIGH (ref 28–100)

## 2016-11-25 LAB — HIV ANTIBODY (ROUTINE TESTING W REFLEX): HIV Screen 4th Generation wRfx: NONREACTIVE

## 2016-11-25 MED ORDER — IOPAMIDOL (ISOVUE-300) INJECTION 61%
100.0000 mL | Freq: Once | INTRAVENOUS | Status: AC | PRN
  Administered 2016-11-25: 100 mL via INTRAVENOUS

## 2016-11-25 MED ORDER — POTASSIUM CHLORIDE IN NACL 20-0.45 MEQ/L-% IV SOLN
INTRAVENOUS | Status: DC
  Administered 2016-11-25: 13:00:00 via INTRAVENOUS
  Filled 2016-11-25 (×4): qty 1000

## 2016-11-25 MED ORDER — KCL IN DEXTROSE-NACL 20-5-0.45 MEQ/L-%-% IV SOLN
INTRAVENOUS | Status: DC
  Administered 2016-11-25 – 2016-11-26 (×2): via INTRAVENOUS

## 2016-11-25 MED ORDER — ONDANSETRON HCL 4 MG/2ML IJ SOLN
4.0000 mg | Freq: Four times a day (QID) | INTRAMUSCULAR | Status: DC | PRN
  Administered 2016-11-25: 4 mg via INTRAVENOUS
  Filled 2016-11-25: qty 2

## 2016-11-25 MED ORDER — CEFAZOLIN SODIUM-DEXTROSE 1-4 GM/50ML-% IV SOLN
1.0000 g | Freq: Three times a day (TID) | INTRAVENOUS | Status: DC
  Administered 2016-11-25 – 2016-11-27 (×7): 1 g via INTRAVENOUS
  Filled 2016-11-25 (×17): qty 50

## 2016-11-25 MED ORDER — BISACODYL 10 MG RE SUPP
10.0000 mg | Freq: Once | RECTAL | Status: AC
  Administered 2016-11-25: 10 mg via RECTAL
  Filled 2016-11-25: qty 1

## 2016-11-25 NOTE — Progress Notes (Signed)
CT reviewed with Dr. Tyron RussellBoles results reviewed with patient and his wife. Small amount of air noted in gallbladder fossa possibly due to Surgicel. No CT evidence of pancreatitis. Duodenal inflammatory changes noted along with edema and small amount of fluid possibly due to post ERCP pancreatitis and recent surgery. Two small calcifications in pancreas. No prior history of pancreatitis. Nonobstructive left renal stone. Small amount of air in bladder dome possibly due to Catheterization prior to cholecystectomy. Significant amount of gas noted in the colon. Small amount of air noted abdominal wall as well as edema at periumblical region. Focal cellulitis cannot be ruled out but these changes may be due to surgery. Will change antibiotic to provide better gram-positive coverage just in case. Glucose level 85 at 11 AM.  Plan: Change IV to D5 half-normal saline with KCl.. Continue nothing by mouth status for now. Discontinue Cipro and begin Ancef 1 g IV every 8 hours. Continue metronidazole as before. Ondansetron 4 mg IV every 6 when necessary. Will repeat lab studies in a.m.

## 2016-11-25 NOTE — Anesthesia Postprocedure Evaluation (Signed)
Anesthesia Post Note  Patient: [name] LabradorJames M [name]  Procedure(s) Performed: Procedure(s) (LRB): ENDOSCOPIC RETROGRADE CHOLANGIOPANCREATOGRAPHY (ERCP) (N/A) SPHINCTEROTOMY (N/A) REMOVAL OF STONES (N/A)  Patient location during evaluation: Nursing Unit Anesthesia Type: General Level of consciousness: awake and alert, oriented and patient cooperative Pain management: pain level controlled (pain controlled with Dilaudid) Respiratory status: spontaneous breathing Cardiovascular status: stable : Some nausea today; relieved with Zofran. Anesthetic complications: no     Last Vitals:  Vitals:   11/25/16 0447 11/25/16 1307  BP: 124/63 139/61  Pulse: 66 66  Resp: 18 16  Temp: 36.7 C 37.1 C    Last Pain:  Vitals:   11/25/16 1439  TempSrc:   PainSc: Asleep                 ADAMS, AMY A

## 2016-11-25 NOTE — Addendum Note (Signed)
Addendum  created 11/25/16 1517 by Earleen NewportAdams, Amy A, CRNA   Sign clinical note

## 2016-11-25 NOTE — Progress Notes (Signed)
  Subjective:  Patient complains of pain at bellybutton when he moves. He denies epigastric or upper abdominal pain. He also denies nausea or vomiting. He states he's been passing a lot of urine. He has not had a BM in 3 days.  Objective: Blood pressure 124/63, pulse 66, temperature 98.1 F (36.7 C), temperature source Oral, resp. rate 18, height 5\' 10"  (1.778 m), weight 205 lb 0.4 oz (93 kg), SpO2 97 %. Patient is alert and appears to be comfortable. Conjunctiva is pink. Sclera is nonicteric Oropharyngeal mucosa is normal. No neck masses or thyromegaly noted. Cardiac exam with regular rhythm normal S1 and loud snapping S2. Grade 2/6 systolic ejection murmur best heard at left upper sternal border. Lungs are clear to auscultation. Abdomen is full with multiple laparoscopy wounds. Bowel sounds are present. He has moderate tenderness at umbilicus on superficial palpation. No hematoma noted. He has mild hypogastric tenderness but no tenderness across upper abdomen. No LE edema or clubbing noted.  Labs/studies Results:   Recent Labs  11/24/16 1736 11/25/16 0452  WBC 9.0 7.8  HGB 13.2 12.3*  HCT 39.3 37.5*  PLT 135* 137*    BMET   Recent Labs  11/24/16 1736  NA 138  K 3.5  CL 106  CO2 22  GLUCOSE 128*  BUN 14  CREATININE 0.94  CALCIUM 8.1*    LFT   Recent Labs  11/24/16 1736  PROT 6.5  ALBUMIN 3.6  AST 55*  ALT 48  ALKPHOS 55  BILITOT 0.8    Serum amylase and CMET from this morning pending.  Assessment:  #1. Post ERCP RUQ pain with mildly elevated serum amylase indicative of post ERCP pancreatitis. Patient's pain now is primarily at umbilicus, site of laparoscopy wound.  #2. Diabetes mellitus. Patient is on sliding scale coverage. Regular medications on hold. #3. Hypertension. Blood pressure is normal. Antihypertensives on hold. #4. Hyperlipidemia. Atorvastatin on hold. #5. Patient is on mechanical DVT prophylaxis as he had sphincterotomy yesterday and cannot  go on anticoagulants for 72 hours.   Plan:  Abdominopelvic CT with contrast.

## 2016-11-26 ENCOUNTER — Encounter (HOSPITAL_COMMUNITY): Payer: Self-pay | Admitting: Internal Medicine

## 2016-11-26 LAB — COMPREHENSIVE METABOLIC PANEL
ALK PHOS: 53 U/L (ref 38–126)
ALT: 28 U/L (ref 17–63)
AST: 25 U/L (ref 15–41)
Albumin: 3.2 g/dL — ABNORMAL LOW (ref 3.5–5.0)
Anion gap: 9 (ref 5–15)
BILIRUBIN TOTAL: 0.9 mg/dL (ref 0.3–1.2)
BUN: 8 mg/dL (ref 6–20)
CHLORIDE: 106 mmol/L (ref 101–111)
CO2: 25 mmol/L (ref 22–32)
CREATININE: 0.89 mg/dL (ref 0.61–1.24)
Calcium: 8.2 mg/dL — ABNORMAL LOW (ref 8.9–10.3)
GFR calc Af Amer: >60 mL/min (ref >60)
Glucose, Bld: 126 mg/dL — ABNORMAL HIGH (ref 65–99)
Potassium: 3.5 mmol/L (ref 3.5–5.1)
Sodium: 140 mmol/L (ref 135–145)
Total Protein: 5.9 g/dL — ABNORMAL LOW (ref 6.5–8.1)

## 2016-11-26 LAB — CBC
HCT: 38.3 % — ABNORMAL LOW (ref 39.0–52.0)
Hemoglobin: 12.8 g/dL — ABNORMAL LOW (ref 13.0–17.0)
MCH: 32.2 pg (ref 26.0–34.0)
MCHC: 33.4 g/dL (ref 30.0–36.0)
MCV: 96.2 fL (ref 78.0–100.0)
Platelets: 140 10*3/uL — ABNORMAL LOW (ref 150–400)
RBC: 3.98 MIL/uL — ABNORMAL LOW (ref 4.22–5.81)
RDW: 13.5 % (ref 11.5–15.5)
WBC: 5.5 10*3/uL (ref 4.0–10.5)

## 2016-11-26 LAB — GLUCOSE, CAPILLARY
GLUCOSE-CAPILLARY: 126 mg/dL — AB (ref 65–99)
Glucose-Capillary: 130 mg/dL — ABNORMAL HIGH (ref 65–99)
Glucose-Capillary: 144 mg/dL — ABNORMAL HIGH (ref 65–99)
Glucose-Capillary: 176 mg/dL — ABNORMAL HIGH (ref 65–99)

## 2016-11-26 LAB — AMYLASE: AMYLASE: 109 U/L — AB (ref 28–100)

## 2016-11-26 MED ORDER — LISINOPRIL 10 MG PO TABS
20.0000 mg | ORAL_TABLET | Freq: Every day | ORAL | Status: DC
  Administered 2016-11-26 – 2016-11-28 (×3): 20 mg via ORAL
  Filled 2016-11-26 (×3): qty 2

## 2016-11-26 MED ORDER — FLEET ENEMA 7-19 GM/118ML RE ENEM
1.0000 | ENEMA | Freq: Once | RECTAL | Status: AC
  Administered 2016-11-26: 1 via RECTAL

## 2016-11-26 MED ORDER — ATENOLOL 25 MG PO TABS
25.0000 mg | ORAL_TABLET | Freq: Every day | ORAL | Status: DC
  Administered 2016-11-26 – 2016-11-28 (×3): 25 mg via ORAL
  Filled 2016-11-26 (×3): qty 1

## 2016-11-26 NOTE — Progress Notes (Signed)
Pt had moderate amount of results after fleets enema. Liquid stool, brown and green in color.

## 2016-11-26 NOTE — Progress Notes (Signed)
  Subjective:  Patient continues complain of pain in peri-umbilicus region. He states pain is less compared to yesterday. He has more pain when he moves around. He denies back pain. He has been burping. He had a bowel movement and pass flatus following fleets enema this morning. He does not have nausea anymore. He is not hungry.  Objective: Blood pressure 132/63, pulse 70, temperature 98.3 F (36.8 C), temperature source Oral, resp. rate 20, height 5\' 10"  (1.778 m), weight 205 lb 0.4 oz (93 kg), SpO2 95 %. Patient is alert and in no acute distress. He has slight hearing impairment. Conjunctiva is pink. Sclera is nonicteric Oropharyngeal mucosa is normal. No neck masses or thyromegaly noted. Cardiac exam with regular rhythm normal S1 and loud snapping S2. Grade 2/6 systolic ejection murmur best heard at left upper sternal border. Lungs are clear to auscultation. Abdomen is full. Bowel sounds are hyperactive.  No LE edema or clubbing noted.  Labs/studies Results:   Recent Labs  11/24/16 1736 11/25/16 0452 11/26/16 0435  WBC 9.0 7.8 5.5  HGB 13.2 12.3* 12.8*  HCT 39.3 37.5* 38.3*  PLT 135* 137* 140*    BMET   Recent Labs  11/24/16 1736 11/25/16 0452 11/26/16 0435  NA 138 140 140  K 3.5 3.7 3.5  CL 106 108 106  CO2 22 23 25   GLUCOSE 128* 92 126*  BUN 14 10 8   CREATININE 0.94 0.81 0.89  CALCIUM 8.1* 8.0* 8.2*    LFT   Recent Labs  11/24/16 1736 11/25/16 0452 11/26/16 0435  PROT 6.5 5.8* 5.9*  ALBUMIN 3.6 3.2* 3.2*  AST 55* 36 25  ALT 48 36 28  ALKPHOS 55 53 53  BILITOT 0.8 0.7 0.9     Assessment:  #1.Post ERCP pancreatitis. Patient is feeling better. Serum amylase is almost back to normal. Transaminases are normal. Renal function is well-preserved and corrected serum calcium is 8.8. #2. Peri-umbilical pain, site of laparoscopy wound. This area. Thickened on abdominopelvic CT done yesterday. He could have cellulitis or this pain could be  postsurgical.   Recommendations:  Begin clear liquids. Resume atenolol and lisinopril today.

## 2016-11-27 LAB — GLUCOSE, CAPILLARY
GLUCOSE-CAPILLARY: 96 mg/dL (ref 65–99)
Glucose-Capillary: 145 mg/dL — ABNORMAL HIGH (ref 65–99)
Glucose-Capillary: 129 mg/dL — ABNORMAL HIGH (ref 65–99)
Glucose-Capillary: 90 mg/dL (ref 65–99)

## 2016-11-27 MED ORDER — GLIPIZIDE 5 MG PO TABS
5.0000 mg | ORAL_TABLET | Freq: Two times a day (BID) | ORAL | Status: DC
  Administered 2016-11-27 – 2016-11-28 (×3): 5 mg via ORAL
  Filled 2016-11-27 (×3): qty 1

## 2016-11-27 MED ORDER — OXYCODONE HCL 5 MG PO TABS
5.0000 mg | ORAL_TABLET | ORAL | Status: DC | PRN
  Administered 2016-11-27 – 2016-11-28 (×3): 5 mg via ORAL
  Filled 2016-11-27 (×3): qty 1

## 2016-11-27 MED ORDER — SODIUM CHLORIDE 0.45 % IV SOLN
INTRAVENOUS | Status: DC
  Administered 2016-11-27 – 2016-11-28 (×2): via INTRAVENOUS

## 2016-11-27 MED ORDER — CEFAZOLIN SODIUM-DEXTROSE 1-4 GM/50ML-% IV SOLN
1.0000 g | Freq: Three times a day (TID) | INTRAVENOUS | Status: DC
  Administered 2016-11-27 – 2016-11-28 (×2): 1 g via INTRAVENOUS
  Filled 2016-11-27 (×9): qty 50

## 2016-11-27 MED ORDER — PANTOPRAZOLE SODIUM 40 MG PO TBEC
40.0000 mg | DELAYED_RELEASE_TABLET | Freq: Every day | ORAL | Status: DC
  Administered 2016-11-28: 40 mg via ORAL
  Filled 2016-11-27: qty 1

## 2016-11-27 MED ORDER — SIMETHICONE 80 MG PO CHEW
160.0000 mg | CHEWABLE_TABLET | Freq: Four times a day (QID) | ORAL | Status: DC
  Administered 2016-11-27 – 2016-11-28 (×4): 160 mg via ORAL
  Filled 2016-11-27 (×4): qty 2

## 2016-11-27 NOTE — Progress Notes (Signed)
  Subjective:  Patient feels better. Patient states pain at umbilicus is less today. He has more pain when he moves around. He has intermittent gas pain. He has passed flatus few times. He does not have an appetite but he denies nausea or vomiting.  Objective: Blood pressure (!) 149/68, pulse (!) 52, temperature 98.1 F (36.7 C), temperature source Oral, resp. rate 20, height 5\' 10"  (1.778 m), weight 205 lb 0.4 oz (93 kg), SpO2 95 %. Patient is alert and appears to be comfortable. Abdomen is full. Bowel sounds are normal. He remains with visit tenderness in umbilicus region on superficial palpation. Abdomen is full but not tender in other areas.  Percussion note is tympanitic. No LE edema or clubbing noted.  Labs/studies Results:   Recent Labs  11/24/16 1736 11/25/16 0452 11/26/16 0435  WBC 9.0 7.8 5.5  HGB 13.2 12.3* 12.8*  HCT 39.3 37.5* 38.3*  PLT 135* 137* 140*    BMET   Recent Labs  11/24/16 1736 11/25/16 0452 11/26/16 0435  NA 138 140 140  K 3.5 3.7 3.5  CL 106 108 106  CO2 22 23 25   GLUCOSE 128* 92 126*  BUN 14 10 8   CREATININE 0.94 0.81 0.89  CALCIUM 8.1* 8.0* 8.2*    LFT   Recent Labs  11/24/16 1736 11/25/16 0452 11/26/16 0435  PROT 6.5 5.8* 5.9*  ALBUMIN 3.6 3.2* 3.2*  AST 55* 36 25  ALT 48 36 28  ALKPHOS 55 53 53  BILITOT 0.8 0.7 0.9     Assessment:  #1.Post ERCP pancreatitis. Slow improvement but no parameters to suggest progression. He may have an element of ileus secondary to pain medication and bedrest. #2. Diabetes mellitus. . #3. Hypertension. Patient was begun on atenolol and lisinopril yesterday. #4. Abdominal wall pain at umbilicus/laparoscopy wound is decreasing.  Plan:  Diet advanced to full liquids. Begin glipizide 5 mg by mouth twice a day. Mylicon 160 mg every 6 hours 4 doses. Change IV to half-normal saline. Change pain medication to oxycodone 5 mg by mouth every 4 when necessary. Patient encouraged to ambulate on  floor.

## 2016-11-28 LAB — COMPREHENSIVE METABOLIC PANEL
ALT: 22 U/L (ref 17–63)
ANION GAP: 9 (ref 5–15)
AST: 26 U/L (ref 15–41)
Albumin: 3.1 g/dL — ABNORMAL LOW (ref 3.5–5.0)
Alkaline Phosphatase: 50 U/L (ref 38–126)
BUN: 7 mg/dL (ref 6–20)
CHLORIDE: 108 mmol/L (ref 101–111)
CO2: 25 mmol/L (ref 22–32)
CREATININE: 0.78 mg/dL (ref 0.61–1.24)
Calcium: 8.8 mg/dL — ABNORMAL LOW (ref 8.9–10.3)
Glucose, Bld: 85 mg/dL (ref 65–99)
POTASSIUM: 3.5 mmol/L (ref 3.5–5.1)
SODIUM: 142 mmol/L (ref 135–145)
Total Bilirubin: 0.7 mg/dL (ref 0.3–1.2)
Total Protein: 6 g/dL — ABNORMAL LOW (ref 6.5–8.1)

## 2016-11-28 LAB — CBC
HCT: 38.3 % — ABNORMAL LOW (ref 39.0–52.0)
Hemoglobin: 13 g/dL (ref 13.0–17.0)
MCH: 31.6 pg (ref 26.0–34.0)
MCHC: 33.9 g/dL (ref 30.0–36.0)
MCV: 93.2 fL (ref 78.0–100.0)
PLATELETS: 153 10*3/uL (ref 150–400)
RBC: 4.11 MIL/uL — AB (ref 4.22–5.81)
RDW: 13.3 % (ref 11.5–15.5)
WBC: 5.1 10*3/uL (ref 4.0–10.5)

## 2016-11-28 LAB — AMYLASE: AMYLASE: 55 U/L (ref 28–100)

## 2016-11-28 MED ORDER — SIMETHICONE 80 MG PO CHEW: 160.0000 mg | CHEWABLE_TABLET | Freq: Four times a day (QID) | ORAL | 0 refills | Status: DC | PRN

## 2016-11-28 MED ORDER — OXYCODONE-ACETAMINOPHEN 5-325 MG PO TABS: 1.0000 | ORAL_TABLET | Freq: Four times a day (QID) | ORAL | 0 refills | Status: DC | PRN

## 2016-11-28 MED ORDER — PROMETHAZINE HCL 25 MG PO TABS
25.0000 mg | ORAL_TABLET | Freq: Two times a day (BID) | ORAL | 0 refills | Status: DC | PRN
Start: 2016-11-28 — End: 2017-01-19

## 2016-11-28 MED ORDER — OXYCODONE-ACETAMINOPHEN 5-300 MG PO TABS: 1.0000 | ORAL_TABLET | ORAL | 0 refills | Status: DC | PRN

## 2016-11-28 MED ORDER — OXYCODONE HCL 5 MG PO TABS: 5.0000 mg | ORAL_TABLET | Freq: Four times a day (QID) | ORAL | 0 refills | Status: DC | PRN

## 2016-11-28 MED ORDER — OXYCODONE HCL 5 MG PO TABS: 5.0000 mg | ORAL_TABLET | ORAL | 0 refills | Status: DC | PRN

## 2016-11-28 MED ORDER — CEPHALEXIN 500 MG PO CAPS: 500.0000 mg | ORAL_CAPSULE | Freq: Four times a day (QID) | ORAL | 0 refills | Status: DC

## 2016-11-28 NOTE — Discharge Summary (Signed)
Physician Discharge Summary  [name] [name]:[id number] DOB: 09/27/1955 DOA: 11/24/2016  PCP: Ernestina Penna, MD   Referring physician; Quentin Angst, M.D.  Admit date: 11/24/2016 Discharge date: 11/28/2016    Recommendations for Outpatient Follow-up:  1. Follow with Dr. Marcha Solders on 12/09/2016.  Discharge Diagnoses:   Principal diagnosis: Post ERCP pancreatitis. Omphalitis.  Miscellaneous diagnosis: Choledocholithiasis. Status post cholecystectomy on 11/23/2016. Hypertension. Diabetes mellitus. GERD. Hyperlipidemia. Aortic valve disease. Patient has prosthetic aortic valve. Insomnia.    [name]  Discharge Condition: Doing well  Diet recommendation: Diabetic diet  Filed Weights   11/24/16 1146 11/24/16 1811  Weight: 205 lb (93 kg) 205 lb 0.4 oz (93 kg)    Hospital Course:   Patient is 61 year old Caucasian male who was found to have choledocholithiasis at the time of cholecystectomy on 11/23/2016 at [facility] by Dr. Quentin Angst. Patient was therefore referred for therapeutic ERCP. Procedure was performed on 11/24/2016. Biliary sphincterotomy was performed with removal of small stones. Pancreatic duct was not filled with contrast. Postprocedure patient complained of right upper quadrant abdominal pain. Indomethacin suppository was administered. Serum amylase was checked and was over 600. Patient was therefore felt to have post ERCP pancreatitis and was hospitalized. Upper abdominal pain resolved quickly but he complained of severe pain at umbilicus. He was felt to have omphalitis. Antibiotic coverage was changed to Ancef. Abdominopelvic CT on 11/25/2016 revealed no evidence of pancreatitis. Revealed postsurgical changes in right upper quadrant. There was trace gas and abdominal wall with edema and umbilicus felt to be related to recent laparoscopy. Patient was begun on clear liquids and eyes advanced to full liquids and diabetic diet this morning. He has  not experienced upper abdominal pain for 2 days. Pain at umbilicus is decreasing. Serum amylase has returned to normal. Patient's CBC and comprehensive chemistry panel are also normal except mild hypoalbuminemia due to acute illness. Patient's condition was discussed with Dr. Marcha Solders who recommended continuing antibiotic until office visit next week. Patient will resume his diabetic medicines the next 1-2 days depending on oral intake.    Discharge Exam: Vitals:   11/27/16 2107 11/28/16 0500  BP: (!) 157/70 (!) 165/79  Pulse: (!) 51 62  Resp: 20 20  Temp: 98.2 F (36.8 C) 98.2 F (36.8 C)     Discharge Instructions   Allergies as of 11/28/2016      Reactions   Sulfa Antibiotics Nausea And Vomiting   Hctz [hydrochlorothiazide]    Nausea and headache    Sulfonamide Derivatives       Medication List    STOP taking these medications   guaiFENesin 600 MG 12 hr tablet Commonly known as:  MUCINEX     TAKE these medications   ANDROGEL PUMP 20.25 MG/ACT (1.62%) Gel Generic drug:  Testosterone APPLY 3 PUMPS TOPICALLY EVERY DAY - PDM COUPON ON FILE - JB 04/23/2016   atenolol 25 MG tablet Commonly known as:  TENORMIN TAKE 1 TABLET DAILY   atorvastatin 40 MG tablet Commonly known as:  LIPITOR TAKE 1 TABLET DAILY   cephALEXin 500 MG capsule Commonly known as:  KEFLEX Take 1 capsule (500 mg total) by mouth 4 (four) times daily.   cholecalciferol 1000 units tablet Commonly known as:  VITAMIN D Take 2,000 Units by mouth daily.   empagliflozin 25 MG Tabs tablet Commonly known as:  JARDIANCE TAKE 1 TABLET BY MOUTH EVERY DAY (MCK CARD)   fluticasone 50 MCG/ACT nasal spray Commonly  known as:  FLONASE Place 2 sprays into the nose daily as needed.   glipiZIDE 5 MG tablet Commonly known as:  GLUCOTROL TAKE 1 TABLET TWICE A DAY BEFORE A MEAL (DISCONTINUE NATEGLINIDE/STARLIX)   lisinopril 20 MG tablet Commonly known as:  PRINIVIL,ZESTRIL TAKE 1 TABLET DAILY   metFORMIN  1000 MG tablet Commonly known as:  GLUCOPHAGE TAKE 1 TABLET TWICE DAILY WITH MEALS   oxyCODONE 5 MG immediate release tablet Commonly known as:  Oxy IR/ROXICODONE Take 1 tablet (5 mg total) by mouth every 6 (six) hours as needed for moderate pain.   promethazine 25 MG tablet Commonly known as:  PHENERGAN Take 1 tablet (25 mg total) by mouth 2 (two) times daily as needed for nausea or vomiting. What changed:  See the new instructions.   ranitidine 150 MG tablet Commonly known as:  ZANTAC Take 150 mg by mouth 2 (two) times daily.   SB LOW DOSE ASA EC 81 MG EC tablet Generic drug:  aspirin Take 81 mg by mouth daily.   simethicone 80 MG chewable tablet Commonly known as:  MYLICON Chew 2 tablets (160 mg total) by mouth 4 (four) times daily as needed for flatulence.   temazepam 15 MG capsule Commonly known as:  RESTORIL TAKE 1 TABLET BY MOUTH AT BEDTIME AS NEEDED      Allergies  Allergen Reactions  . Sulfa Antibiotics Nausea And Vomiting  . Hctz [Hydrochlorothiazide]     Nausea and headache   . Sulfonamide Derivatives       The results of significant diagnostics from this hospitalization (including imaging, microbiology, ancillary and laboratory) are listed below for reference.    Significant Diagnostic Studies: X-ray Abdomen Ap  Result Date: 11/24/2016 CLINICAL DATA:  Choledocholithiasis post ERCP with sphincterotomy and stone extraction EXAM: ABDOMEN - 1 VIEW COMPARISON:  ERCP images of 11/24/2016 FINDINGS: Complete clearance of contrast from the CBD. No residual biliary contrast identified. Gas distention of stomach with scattered gas and contrast in the colon. No definite extravasated contrast seen. IMPRESSION: No residual biliary or definite extravasated contrast identified. Electronically Signed   By: Ulyses Southward M.D.   On: 11/24/2016 15:25   Ct Abdomen Pelvis W Contrast  Result Date: 11/25/2016 CLINICAL DATA:  Nausea vomiting. Abdominal pain. Recent cholecystectomy.  ERCP yesterday. EXAM: CT ABDOMEN AND PELVIS WITH CONTRAST TECHNIQUE: Multidetector CT imaging of the abdomen and pelvis was performed using the standard protocol following bolus administration of intravenous contrast. CONTRAST:  ISOVUE-300 IOPAMIDOL (ISOVUE-300) INJECTION 61% COMPARISON:  CT 11/21/2016 ERCP 11/24/2016 FINDINGS: Lower chest: Vascular congestion lung bases.  Small effusions. Hepatobiliary: No focal hepatic lesion. There is postsurgical tissue within the gallbladder fossa I with small amount gas. No organized collection. Cholecystectomy clips noted. No intrahepatic duct dilatation. Common bile duct normal caliber. There is a small moderate stranding along the anterior pararenal fascia on the RIGHT. Morrison's pouch. Tiny pockets gas related to recent surgery. Pancreas: No evidence of significant pancreatic inflammation. The pancreatic duct is normal caliber. No fluid collections. Small amount fluid along the duodenum (anti mesenteric border) Spleen: Normal spleen Adrenals/urinary tract: Adrenal glands are normal. Calculus lower pole of the LEFT kidney appears small duct gas the bladder presumably related to catheterization. Stomach/Bowel: Stomach, small bowel, appendix, and cecum are normal. The colon and rectosigmoid colon are normal. Vascular/Lymphatic: Abdominal aorta is normal caliber with atherosclerotic calcification. There is no retroperitoneal or periportal lymphadenopathy. No pelvic lymphadenopathy. Reproductive: Prostate normal Other: A trace gas in the abdominal wall consistent with  laparoscopic cholecystectomy. Small amounts of gas and inflammation at the umbilicus related to trocar entry site. Musculoskeletal: No aggressive osseous lesion. IMPRESSION: 1. Expected postsurgical change in the gallbladder fossa. 2. Mild inflammation and fluid in Morrison's pouch and along the second portion duodenum consistent with recent surgery. No unexpected findings. 3. No evidence acute  pancreatitis. Electronically Signed   By: Genevive Bi M.D.   On: 11/25/2016 15:29   Dg Ercp With Sphincterotomy  Result Date: 11/24/2016 CLINICAL DATA:  Larina Bras extraction EXAM: ERCP TECHNIQUE: Multiple spot images obtained with the fluoroscopic device and submitted for interpretation post-procedure. FLUOROSCOPY TIME:  Fluoroscopy Time:  1 minutes and 7 seconds Radiation Exposure Index (if provided by the fluoroscopic device): Number of Acquired Spot Images: 6 COMPARISON:  None. FINDINGS: Cannulation of the common bile duct is noted. Contrast fills the biliary tree. A stone retrieval basket can be seen within the common bile duct. Balloon stone retrieval is also documented. IMPRESSION: See above. These images were submitted for radiologic interpretation only. Please see the procedural report for the amount of contrast and the fluoroscopy time utilized. Electronically Signed   By: Jolaine Click M.D.   On: 11/24/2016 16:03   Ct Angio Chest Aorta W &/or Wo Contrast  Result Date: 11/11/2016 CLINICAL DATA:  Follow up aneurysm ; some sob; diabetes; dx with thoracic aneurysm 2012 EXAM: CT ANGIOGRAPHY CHEST WITH CONTRAST TECHNIQUE: Multidetector CT imaging of the chest was performed using the standard protocol during bolus administration of intravenous contrast. Multiplanar CT image reconstructions and MIPs were obtained to evaluate the vascular anatomy. CONTRAST:  100 cc Isovue 370 COMPARISON:  11/06/2015 and prior studies including 2012 FINDINGS: Cardiovascular: Status post median sternotomy and aortic valve replacement. Heart size is normal. No pericardial effusions. Significant coronary artery disease and stents. There is mild fusiform dilatation of the ascending aorta, measuring a maximum of 4.0 cm and stable in appearance. There is atherosclerotic calcification of the thoracic aorta and great vessels. Normal arch anatomy. Mediastinum/Nodes: The visualized portion of the thyroid gland has a normal appearance.  No mediastinal, hilar, or axillary adenopathy. The esophagus is normal in appearance. Lungs/Pleura: No pleural effusion or pneumothorax. 2-3 mm pleural-based upper lobe nodules appear stable a 5 mm nodule in the right lower lobe is seen on image 41 of series 8 and is stable. Upper Abdomen: The gallbladder is present. Suspect layering stones or sludge. Visualized portions of the liver, spleen, pancreas, and kidneys are normal. The adrenal glands are normal. Musculoskeletal: Degenerative changes are seen in the spine. No suspicious lytic or blastic lesions are identified. Review of the MIP images confirms the above findings. IMPRESSION: 1. Stable mild fusiform aneurysm of the ascending aorta, measuring maximum diameter of 4.0 cm. Recommend annual imaging followup by CTA or MRA. This recommendation follows 2010 ACCF/AHA/AATS/ACR/ASA/SCA/SCAI/SIR/STS/SVM Guidelines for the Diagnosis and Management of Patients with Thoracic Aortic Disease. Circulation. 2010; 121: B284-X324 2. Long-term stability of pulmonary nodules, consistent with benign process. Electronically Signed   By: Norva Pavlov M.D.   On: 11/11/2016 17:10    Microbiology: No results found for this or any previous visit (from the past 240 hour(s)).   Labs: Basic Metabolic Panel:  Recent Labs Lab 11/24/16 1736 11/25/16 0452 11/26/16 0435 11/28/16 0439  NA 138 140 140 142  K 3.5 3.7 3.5 3.5  CL 106 108 106 108  CO2 22 23 25 25   GLUCOSE 128* 92 126* 85  BUN 14 10 8 7   CREATININE 0.94 0.81 0.89 0.78  CALCIUM  8.1* 8.0* 8.2* 8.8*   Liver Function Tests:  Recent Labs Lab 11/24/16 1736 11/25/16 0452 11/26/16 0435 11/28/16 0439  AST 55* 36 25 26  ALT 48 36 28 22  ALKPHOS 55 53 53 50  BILITOT 0.8 0.7 0.9 0.7  PROT 6.5 5.8* 5.9* 6.0*  ALBUMIN 3.6 3.2* 3.2* 3.1*    Recent Labs Lab 11/24/16 1736 11/25/16 0452 11/26/16 0435 11/28/16 0439  AMYLASE 613* 305* 109* 55   No results for input(s): AMMONIA in the last 168  hours. CBC:  Recent Labs Lab 11/24/16 1736 11/25/16 0452 11/26/16 0435 11/28/16 0439  WBC 9.0 7.8 5.5 5.1  NEUTROABS  --  6.2  --   --   HGB 13.2 12.3* 12.8* 13.0  HCT 39.3 37.5* 38.3* 38.3*  MCV 95.2 95.9 96.2 93.2  PLT 135* 137* 140* 153   Cardiac Enzymes: No results for input(s): CKTOTAL, CKMB, CKMBINDEX, TROPONINI in the last 168 hours. BNP: BNP (last 3 results) No results for input(s): BNP in the last 8760 hours.  ProBNP (last 3 results) No results for input(s): PROBNP in the last 8760 hours.  CBG:  Recent Labs Lab 11/26/16 2116 11/27/16 0806 11/27/16 1123 11/27/16 1645 11/27/16 2126  GLUCAP 126* 145* 129* 96 90       Signed:  Porfiria Heinrich  [facility] 11/28/2016, 10:49 AM

## 2016-11-28 NOTE — Progress Notes (Addendum)
Discharge instructions read to patient and family. All questions answered to patient satisfaction.  Discharged to home with family.

## 2016-11-28 NOTE — Progress Notes (Signed)
  Subjective:  Patient feels much better. He had 2 bowel movements yesterday. He is passing some flatus. He ate 50% of his breakfast. He denies nausea vomiting heartburn or upper abdominal pain. He continues to complain of pain at umbilicus. He says pain is less today. His wife states skin does not look as swollen as yesterday.  Objective: Blood pressure (!) 165/79, pulse 62, temperature 98.2 F (36.8 C), temperature source Oral, resp. rate 20, height 5\' 10"  (1.778 m), weight 205 lb 0.4 oz (93 kg), SpO2 97 %. Patient is alert. He is sitting in a chair and appears to be comfortable. Abdomen is full but nondistended. Bowel sounds are normal. Skin at umbilicus reveals edema and erythema. No drainage noted from umbilicus on laparoscopy wound. No LE edema or clubbing noted.  Labs/studies Results:   Recent Labs  11/26/16 0435 11/28/16 0439  WBC 5.5 5.1  HGB 12.8* 13.0  HCT 38.3* 38.3*  PLT 140* 153    BMET   Recent Labs  11/26/16 0435 11/28/16 0439  NA 140 142  K 3.5 3.5  CL 106 108  CO2 25 25  GLUCOSE 126* 85  BUN 8 7  CREATININE 0.89 0.78  CALCIUM 8.2* 8.8*    LFT   Recent Labs  11/26/16 0435 11/28/16 0439  PROT 5.9* 6.0*  ALBUMIN 3.2* 3.1*  AST 25 26  ALT 28 22  ALKPHOS 53 50  BILITOT 0.9 0.7    Serum amylase is 55.  Assessment:  #1. Post ERCP pancreatitis. Upper abdominal pain has resolved. Serum amylase is normal. He is tolerating diabetic diet. #2. Omphalitis. He does not have any drainage. He is having less pain. There is edema and erythema to skin at the umbilicus. Continue antibiotic for another 5 days and arrange for follow-up visit with Dr. Marcha Soldersathey. #3. Diabetes mellitus. Patient will resume his usual medications starting tomorrow. #4. Hypertension.   Plan:  Patient ready for discharge. We will switch him to cephalexin 500 mg by mouth 4 times a day for 5 days. Oxycodone 5 mg by mouth every 6 when necessary. Follow-up with Dr. Marcha Soldersathey next  week.

## 2016-12-01 LAB — GLUCOSE, CAPILLARY: Glucose-Capillary: 107 mg/dL — ABNORMAL HIGH (ref 65–99)

## 2016-12-02 ENCOUNTER — Telehealth: Payer: Self-pay | Admitting: *Deleted

## 2016-12-02 NOTE — Telephone Encounter (Signed)
Antoine PocheBranch, Jonathan F, MD  Lesle ChrisHill, Acen Craun G, LPN        Can hold off on stress test, have him see me back in 3 weeks to reevalute after his GI issues are resolved   J BrancH MD

## 2016-12-02 NOTE — Telephone Encounter (Signed)
Notes recorded by Lesle ChrisHill, Adley Castello G, LPN on 2/95/62135/30/2018 at 10:32 AM EDT Patient notified. Copy to pmd. Follow up scheduled for 12/15/2016 with Dr. Wyline MoodBranch. ------  Notes recorded by Lesle ChrisHill, Manases Etchison G, LPN on 0/86/57845/30/2018 at 8:59 AM EDT Left message to return call.

## 2016-12-02 NOTE — Telephone Encounter (Signed)
Notes recorded by Lesle ChrisHill, Gearldene Fiorenza G, LPN on 1/61/09605/23/2018 at 12:19 PM EDT Currently admitted. Still want stress test? ------  Notes recorded by Antoine PocheBranch, Jonathan F, MD on 11/20/2016 at 12:27 PM EDT Echo looks good. Please order exercise nuclear stress test to further evaluate his symptoms

## 2016-12-07 ENCOUNTER — Encounter: Payer: Self-pay | Admitting: Family Medicine

## 2016-12-14 ENCOUNTER — Encounter: Payer: Self-pay | Admitting: *Deleted

## 2016-12-15 ENCOUNTER — Encounter: Payer: Self-pay | Admitting: Cardiology

## 2016-12-15 ENCOUNTER — Ambulatory Visit (INDEPENDENT_AMBULATORY_CARE_PROVIDER_SITE_OTHER): Payer: BLUE CROSS/BLUE SHIELD | Admitting: Cardiology

## 2016-12-15 VITALS — BP 125/70 | HR 66 | Ht 70.0 in | Wt 202.8 lb

## 2016-12-15 DIAGNOSIS — R0602 Shortness of breath: Secondary | ICD-10-CM

## 2016-12-15 DIAGNOSIS — I712 Thoracic aortic aneurysm, without rupture: Secondary | ICD-10-CM

## 2016-12-15 DIAGNOSIS — I10 Essential (primary) hypertension: Secondary | ICD-10-CM | POA: Diagnosis not present

## 2016-12-15 DIAGNOSIS — Z952 Presence of prosthetic heart valve: Secondary | ICD-10-CM | POA: Diagnosis not present

## 2016-12-15 DIAGNOSIS — E782 Mixed hyperlipidemia: Secondary | ICD-10-CM

## 2016-12-15 DIAGNOSIS — R0789 Other chest pain: Secondary | ICD-10-CM | POA: Diagnosis not present

## 2016-12-15 DIAGNOSIS — I7121 Aneurysm of the ascending aorta, without rupture: Secondary | ICD-10-CM

## 2016-12-15 NOTE — Patient Instructions (Signed)
Your physician recommends that you schedule a follow-up appointment in: 4 MONTHS WITH DR BRANCH  Your physician recommends that you continue on your current medications as directed. Please refer to the Current Medication list given to you today.  Thank you for choosing [facility]!!    

## 2016-12-15 NOTE — Progress Notes (Signed)
Clinical Summary [name] [name] is a 61 y.o.male seen today for follow up of the following medical problems.   1. AVR - history of bicuspid AV, s/p bioprosthetic AVR in 2012 - has had some recent SOB. Walks regularly, can have some chest tightness with walking - tighthness on left side, 5-6/10. No other associated symptoms. Lasts few minutes, gets better with rest. Noticeable with walking up hill. Never occurs at rest. Symptoms started several months, stable in frequency and severity.   - echo 11/2016 LVEF 60-65%, no WMAs, normal diastolic function, mild gradient across AVR.  - a stress test was postponed, as patient was undergoing extensive management for gallstones - limited activity since last visit. No recent symptoms.  2. Ascending aortic aneurysm - mild aortic root dilatation at 4.1 to 4.2 cm by last MRI 10/2013, likely related to hx of bicuspid AV and potential coexisting aortopathy - CTA 10/2014 4.1 cm ascending aortic aneurysm.   - CTA 11/2016 done since last visit stable 4 cm aneurysm  3. HTN - on higher dose of lisinopril had symptomatic low bp's, back down to 20mg  daily by pcp.  - he remains compliant with meds   4. OSA - not compliant with CPAP due to discomfort. Has not been intrested in retyring  5. Hyperlipidemia -he remains compliant with lipitor - Last panel 09/2016 TC 115 TG 120 HDL 28 LDL 63  6. DM2 - followed by pcp - 04/2016 HgbA1c 7.5   7. Pulmonary  Nodules - stable by imaging.   8. Gallstones - followed by GI, recent ERCP, complicated by pos tERCP pancreatitis   Past Medical History:  Diagnosis Date  . Aortic stenosis 8/09   Bicuspid aortic valve  . Arthritis 6/10   hand   . Atrial fibrillation (HCC)    post op only  . CAD (coronary artery disease) 8/09  . CTS (carpal tunnel syndrome)    bilateral   . Diastolic dysfunction 8/09  . Elevated LFTs   . Fatty liver disease, nonalcoholic   . Hyperlipidemia   . Hypertension   .  Kidney stones   . Nasal congestion   . NIDDM (non-insulin dependent diabetes mellitus)    x15 yrs  . OSA on CPAP 2006  . Paresthesias in left hand 02/19/2004  . Paresthesias in right hand 02/19/2004  . Sinusitis   . Vitamin D deficiency 06/13/09     Allergies  Allergen Reactions  . Sulfa Antibiotics Nausea And Vomiting  . Hctz [Hydrochlorothiazide]     Nausea and headache   . Sulfonamide Derivatives      Current Outpatient Prescriptions  Medication Sig Dispense Refill  . ANDROGEL PUMP 20.25 MG/ACT (1.62%) GEL APPLY 3 PUMPS TOPICALLY EVERY DAY - PDM COUPON ON FILE - JB 04/23/2016 75 g 2  . aspirin (SB LOW DOSE ASA EC) 81 MG EC tablet Take 81 mg by mouth daily.      [name]. atenolol (TENORMIN) 25 MG tablet TAKE 1 TABLET DAILY 90 tablet 3  . atorvastatin (LIPITOR) 40 MG tablet TAKE 1 TABLET DAILY 90 tablet 1  . cephALEXin (KEFLEX) 500 MG capsule Take 1 capsule (500 mg total) by mouth 4 (four) times daily. 20 capsule 0  . cholecalciferol (VITAMIN D) 1000 UNITS tablet Take 2,000 Units by mouth daily.     . empagliflozin (JARDIANCE) 25 MG TABS tablet TAKE 1 TABLET BY MOUTH EVERY DAY (MCK CARD) 30 tablet 1  . fluticasone (FLONASE) 50 MCG/ACT nasal spray Place 2 sprays into  the nose daily as needed.    [name] glipiZIDE (GLUCOTROL) 5 MG tablet TAKE 1 TABLET TWICE A DAY BEFORE A MEAL (DISCONTINUE NATEGLINIDE/STARLIX) 180 tablet 1  . lisinopril (PRINIVIL,ZESTRIL) 20 MG tablet TAKE 1 TABLET DAILY 90 tablet 1  . metFORMIN (GLUCOPHAGE) 1000 MG tablet TAKE 1 TABLET TWICE DAILY WITH MEALS 180 tablet 1  . oxyCODONE (OXY IR/ROXICODONE) 5 MG immediate release tablet Take 1 tablet (5 mg total) by mouth every 6 (six) hours as needed for moderate pain. 30 tablet 0  . promethazine (PHENERGAN) 25 MG tablet Take 1 tablet (25 mg total) by mouth 2 (two) times daily as needed for nausea or vomiting. 20 tablet 0  . ranitidine (ZANTAC) 150 MG tablet Take 150 mg by mouth 2 (two) times daily.     . simethicone (MYLICON) 80  MG chewable tablet Chew 2 tablets (160 mg total) by mouth 4 (four) times daily as needed for flatulence. 30 tablet 0  . temazepam (RESTORIL) 15 MG capsule TAKE 1 TABLET BY MOUTH AT BEDTIME AS NEEDED 30 capsule 2   No current facility-administered medications for this visit.      Past Surgical History:  Procedure Laterality Date  . CARPAL TUNNEL RELEASE     bilateral   . CORONARY ANGIOPLASTY WITH STENT PLACEMENT     to rt coronary atery (Dr. Degert-cardiologist)  . ERCP N/A 11/24/2016   Procedure: ENDOSCOPIC RETROGRADE CHOLANGIOPANCREATOGRAPHY (ERCP);  Surgeon: Malissa Hippo, MD;  [facility];  Service: Endoscopy;  Laterality: N/A;  . REMOVAL OF STONES N/A 11/24/2016   Procedure: REMOVAL OF STONES;  Surgeon: Malissa Hippo, MD;  Location: [facility];  Service: Endoscopy;  Laterality: N/A;  . SPHINCTEROTOMY N/A 11/24/2016   Procedure: SPHINCTEROTOMY;  Surgeon: Malissa Hippo, MD;  Location: [facility];  Service: Endoscopy;  Laterality: N/A;  . TISSUE AORTIC VALVE REPLACEMENT     2012     Allergies  Allergen Reactions  . Sulfa Antibiotics Nausea And Vomiting  . Hctz [Hydrochlorothiazide]     Nausea and headache   . Sulfonamide Derivatives       Family History  Problem Relation Age of Onset  . Hearing loss Mother   . Alzheimer's disease Mother   . Hearing loss Father   . Diabetes Father   . Diabetes Unknown        Family History      Social History Mr. Dupee reports that he quit smoking about 14 years ago. His smoking use included Cigarettes. He started smoking about 42 years ago. He has a 37.50 pack-year smoking history. He has never used smokeless tobacco. Mr. Edmondson reports that he does not drink alcohol.   Review of Systems CONSTITUTIONAL: No weight loss, fever, chills, weakness or fatigue.  HEENT: Eyes: No visual loss, blurred vision, double vision or yellow sclerae.No hearing loss, sneezing, congestion, runny nose or sore throat.  SKIN: No  rash or itching.  CARDIOVASCULAR: per hpi RESPIRATORY: No shortness of breath, cough or sputum.  GASTROINTESTINAL: No anorexia, nausea, vomiting or diarrhea. No abdominal pain or blood.  GENITOURINARY: No burning on urination, no polyuria NEUROLOGICAL: No headache, dizziness, syncope, paralysis, ataxia, numbness or tingling in the extremities. No change in bowel or bladder control.  MUSCULOSKELETAL: No muscle, back pain, joint pain or stiffness.  LYMPHATICS: No enlarged nodes. No history of splenectomy.  PSYCHIATRIC: No history of depression or anxiety.  ENDOCRINOLOGIC: No reports of sweating, cold or heat intolerance. No polyuria or polydipsia.  [name]  Physical Examination Vitals:   12/15/16 1553  BP: 125/70  Pulse: 66   Vitals:   12/15/16 1553  Weight: 202 lb 12.8 oz (92 kg)  Height: 5\' 10"  (1.778 m)    Gen: resting comfortably, no acute distress HEENT: no scleral icterus, pupils equal round and reactive, no palptable cervical adenopathy,  CV: RRR, 2/6 systolic murmur RUSB, no jvd Resp: Clear to auscultation bilaterally GI: abdomen is soft, non-tender, non-distended, normal bowel sounds, no hepatosplenomegaly MSK: extremities are warm, no edema.  Skin: warm, no rash Neuro:  no focal deficits Psych: appropriate affect   Diagnostic Studies 09/2012 MRI/MRA Chest Stable 4 cm ascending thoracic aneurysm  08/2010 Cath HEMODYNAMIC DATA: Right atrial pressures 7/5 with a mean of 4 mmHg,  right ventricular pressure is 24 with an EDP of 7 mmHg. Pulmonary  artery pressure is 20/7 with a mean of 13 mmHg. Pulmonary capillary  wedge pressure is 10/7 with a mean of 6 mmHg. Aortic pressure is 118/67  with a mean of 88 mmHg. Left ventricular pressure is 161 with an EDP of  12 mmHg. There is no significant mitral valve gradient. The aortic  valve mean gradient is 31 mmHg with calculated valve area of 0.8  centimeter squared. Thermodilution cardiac output is 4.4 L per minute  with  an index of 2, Fick cardiac outputs 4.7 L per minute with an index  of 2.2.  ANGIOGRAPHIC DATA: The left ventricular angiography was performed in a  RAO view. This demonstrates normal left ventricular size and  contractility with normal systolic function. Ejection fraction is  estimated at 65%. The aortic valve is calcified with eccentric opening  and restrictive mobility.  The left coronary artery arises and distributes normally. The left main  coronary has 20% narrowing in the ostium and in the distal left main.  The left anterior descending artery is a tortuous vessel which has less  than 10% irregularities.  Left circumflex coronary artery is rise to first obtuse marginal vessel  which has a 40-50% stenosis proximally.  The right coronary artery is a dominant vessel, it has 20% disease in  the distal vessel. The stent in the midvessel was widely patent.  FINAL INTERPRETATION:  1. Nonobstructive atherosclerotic coronary artery disease.  2. Normal left ventricular function.  3. Severe aortic stenosis.  4. Normal right heart pressures.  PLAN: Proceed with aortic valve replacement.  10/2013 MRA Chest EXAM: MRA CHEST WITH OR WITHOUT CONTRAST  TECHNIQUE: Angiographic images of the chest were obtained using MRA technique without and with intravenous contrast.  CONTRAST: 20mL MULTIHANCE GADOBENATE DIMEGLUMINE 529 MG/ML IV SOLN  COMPARISON: CT UROGRAM dated 05/08/2011; MR MRA CHEST W/ OR W/O CM dated 10/07/2012; CT ANGIO CHEST dated 09/08/2010  FINDINGS: There is stable aneurysmal dilatation of the aortic root that does not involve the sinuses of Valsalva. No aortic dissection is present. Artifact is noted from a prosthetic aortic valve. Diameter at the level of the sinuses of Valsalva is approximately 3.2 cm. Maximal caliber of the ascending thoracic aorta is 4.1- 4.2 cm. The proximal arch measures 3.8 cm. The distal arch measures 2.6 cm. The descending  thoracic aorta measures 2.6 cm. Proximal great vessels show stable and normal patency without anatomical variant.  The heart size is normal. No pleural or pericardial fluid is identified. No masses or lymphadenopathy are seen. Visualized spine shows no abnormalities.  IMPRESSION: Stable aneurysmal dilatation of the ascending thoracic aorta measuring 4.1- 4.2 cm in greatest diameter. No dissection is identified.  10/2014 CTA Chest IMPRESSION: 1. Mild fusiform aneurysmal dilatation of the ascending thoracic aorta measuring approximately 41 mm in maximal diameter, stable since the 2012 examination. 2. Stable sequela of prior median sternotomy and aortic valve replacement. 3. Coronary artery calcifications. 4. Punctate (approximately 5 mm) in determine right lower lobe pulmonary nodule, not definitely seen on the 20/2012 examination. If the patient is at high risk for bronchogenic carcinoma, follow-up chest CT at 6-12 months is recommended. If the patient is at low risk for bronchogenic carcinoma, follow-up chest CT at 12 months is recommended. This recommendation follows the consensus statement:  11/2015 CTA chest  IMPRESSION: Mild aneurysmal dilatation of the ascending aorta with a maximal diameter of 4.0 cm. This is not significantly changed.   11/2016 echo Study Conclusions  - Left ventricle: The cavity size was normal. Wall thickness was   normal. Systolic function was normal. The estimated ejection   fraction was in the range of 60% to 65%. Wall motion was normal;   there were no regional wall motion abnormalities. Left   ventricular diastolic function parameters were normal. - Aortic valve: There isa 25-mm Edwards pericardial Magna-Ease   valve model #[id number] in the AV position. Mildly calcified   annulus. Trileaflet; normal thickness leaflets. There is a mild   gradient across the prosthetic valve. Mean gradient (S): 12 mm   Hg. Mean gradient of 18 mmHg with  pedhoff probe. Valve area   (VTI): 1.71 cm^2. - Technically adequate study.  11/2016 CTA chest IMPRESSION: 1. Stable mild fusiform aneurysm of the ascending aorta, measuring maximum diameter of 4.0 cm. Recommend annual imaging followup by CTA or MRA. This recommendation follows 2010 ACCF/AHA/AATS/ACR/ASA/SCA/SCAI/SIR/STS/SVM Guidelines for the Diagnosis and Management of Patients with Thoracic Aortic Disease. Circulation. 2010; 121: Z610-R604 2. Long-term stability of pulmonary nodules, consistent with benign process.  Assessment and Plan   1. Aortic valve replacement/SOB/Chest pain - hx of bicuspid AV, s/p bioprosthetic AVR in 2012 - reports recent DOE and chest pain. Normal valve function on recent echo, continue to monitor - chest pain/SOB improved since last visit, though not as physically active due to recent gallbladder issues and pancreatitis. Stress test has been cancelled at this time, follow symptoms as he recovers from his GI issues.    2. Ascending aortic root aneurysm - aneurysm remains stable by recent imaging, continue to monitor.   3. HTN - his bp remains at at goal, continue current meds  4. OSA - he is not interested in being reevaluated for CPAP  5. Hyperlipidemia - at goal, he will continue statin  F/u 4 months   Antoine Poche, M.D.

## 2016-12-23 DIAGNOSIS — H524 Presbyopia: Secondary | ICD-10-CM | POA: Diagnosis not present

## 2016-12-23 DIAGNOSIS — H40002 Preglaucoma, unspecified, left eye: Secondary | ICD-10-CM | POA: Diagnosis not present

## 2016-12-23 DIAGNOSIS — H35033 Hypertensive retinopathy, bilateral: Secondary | ICD-10-CM | POA: Diagnosis not present

## 2016-12-23 DIAGNOSIS — H35032 Hypertensive retinopathy, left eye: Secondary | ICD-10-CM | POA: Diagnosis not present

## 2016-12-23 LAB — HM DIABETES EYE EXAM

## 2016-12-26 ENCOUNTER — Other Ambulatory Visit: Payer: Self-pay | Admitting: Family Medicine

## 2017-01-10 ENCOUNTER — Other Ambulatory Visit: Payer: Self-pay | Admitting: Cardiology

## 2017-01-14 ENCOUNTER — Ambulatory Visit: Payer: BLUE CROSS/BLUE SHIELD | Admitting: Family Medicine

## 2017-01-19 ENCOUNTER — Encounter: Payer: Self-pay | Admitting: Family Medicine

## 2017-01-19 ENCOUNTER — Ambulatory Visit (INDEPENDENT_AMBULATORY_CARE_PROVIDER_SITE_OTHER): Payer: BLUE CROSS/BLUE SHIELD | Admitting: Family Medicine

## 2017-01-19 VITALS — BP 102/57 | HR 60 | Temp 97.9°F | Ht 70.0 in | Wt 205.0 lb

## 2017-01-19 DIAGNOSIS — IMO0001 Reserved for inherently not codable concepts without codable children: Secondary | ICD-10-CM

## 2017-01-19 DIAGNOSIS — I712 Thoracic aortic aneurysm, without rupture: Secondary | ICD-10-CM | POA: Diagnosis not present

## 2017-01-19 DIAGNOSIS — E349 Endocrine disorder, unspecified: Secondary | ICD-10-CM | POA: Diagnosis not present

## 2017-01-19 DIAGNOSIS — N400 Enlarged prostate without lower urinary tract symptoms: Secondary | ICD-10-CM

## 2017-01-19 DIAGNOSIS — E1165 Type 2 diabetes mellitus with hyperglycemia: Secondary | ICD-10-CM

## 2017-01-19 DIAGNOSIS — H9193 Unspecified hearing loss, bilateral: Secondary | ICD-10-CM

## 2017-01-19 DIAGNOSIS — K9289 Other specified diseases of the digestive system: Secondary | ICD-10-CM

## 2017-01-19 DIAGNOSIS — K580 Irritable bowel syndrome with diarrhea: Secondary | ICD-10-CM

## 2017-01-19 DIAGNOSIS — I10 Essential (primary) hypertension: Secondary | ICD-10-CM | POA: Diagnosis not present

## 2017-01-19 DIAGNOSIS — R7989 Other specified abnormal findings of blood chemistry: Secondary | ICD-10-CM

## 2017-01-19 DIAGNOSIS — E785 Hyperlipidemia, unspecified: Secondary | ICD-10-CM | POA: Diagnosis not present

## 2017-01-19 DIAGNOSIS — R945 Abnormal results of liver function studies: Secondary | ICD-10-CM

## 2017-01-19 DIAGNOSIS — K219 Gastro-esophageal reflux disease without esophagitis: Secondary | ICD-10-CM | POA: Diagnosis not present

## 2017-01-19 DIAGNOSIS — I7121 Aneurysm of the ascending aorta, without rupture: Secondary | ICD-10-CM

## 2017-01-19 DIAGNOSIS — E559 Vitamin D deficiency, unspecified: Secondary | ICD-10-CM

## 2017-01-19 DIAGNOSIS — E1169 Type 2 diabetes mellitus with other specified complication: Secondary | ICD-10-CM | POA: Diagnosis not present

## 2017-01-19 DIAGNOSIS — I350 Nonrheumatic aortic (valve) stenosis: Secondary | ICD-10-CM

## 2017-01-19 LAB — BAYER DCA HB A1C WAIVED: HB A1C (BAYER DCA - WAIVED): 9 % — ABNORMAL HIGH (ref <7.0)

## 2017-01-19 NOTE — Progress Notes (Signed)
Subjective:    Patient ID: [name] [name], male    DOB: Mar 20, 1956, 61 y.o.   MRN: [id number]  HPI Pt here for follow up and management of chronic medical problems which includes diabetes, hyperlipidemia and hypertension. He is taking medication regularly.The recent visit with the cardiologist was reviewed before going into the room. The patient has no specific complaints today. He will be given an FOBT to return and will get lab work today. His vital signs are stable and his weight is up a couple pounds. It is of note that he had a common duct stone and ERCP to remove this. This was back in late May of this year. The patient does not chest pain. He does have some shortness of breath and because of his recent gallbladder surgery his cardiologist has postponed doing a stress test until October. He says the shortness of breath may be getting a little bit better following his gallbladder removal. He does continue to complain of some gas and bloating and belching and this is very bothersome to him. He does have occasional loose stools with this. He is hoping this will get better and we suggested that he make an appointment with his gastroenterologist and a couple months so if he is not better at that time he can keep that appointment for further follow-up. He's passing his water without problems. His blood sugars have been running higher in the 150 range fasting and at 200 range during the day because of his lack of activity since his gallbladder surgery. He also complains of increasing problems with his hearing and we will recommend an audiology referral for this.    Patient Active Problem List   Diagnosis Date Noted  . Common bile duct stone 11/24/2016  . Abdominal pain 11/24/2016  . Pancreatitis, acute 11/24/2016  . Pulmonary nodules 11/18/2016  . Abnormal chest CT 11/14/2016  . Type 2 diabetes mellitus with hyperlipidemia ([place]) 12/04/2015  . Sinus congestion 02/18/2015  . GERD (gastroesophageal  reflux disease) 03/08/2014  . NAFLD (nonalcoholic fatty liver disease) 03/08/2014  . Elevated liver enzymes 03/08/2014  . Ascending aortic aneurysm ([place]) 12/12/2012  . INSOMNIA 09/15/2010  . Atrial fibrillation, chronic ([place]) 09/15/2010  . CAD (coronary artery disease) 07/09/2009  . AORTIC VALVE DISORDERS 05/27/2009  . OSTEOARTHRITIS, LOCALIZED 05/27/2009  . Diabetes mellitus type 2, uncontrolled, without complications ([place]) 25/95/6387  . Hyperlipidemia with target LDL less than 70 05/24/2009  . Hypokalemia 05/24/2009  . Essential hypertension 05/24/2009  . UNSPECIFIED SLEEP APNEA 05/24/2009   Outpatient Encounter Prescriptions as of 01/19/2017  Medication Sig  . ANDROGEL PUMP 20.25 MG/ACT (1.62%) GEL APPLY 3 PUMPS TOPICALLY EVERY DAY - PDM COUPON ON FILE - JB 04/23/2016  . aspirin (SB LOW DOSE ASA EC) 81 MG EC tablet Take 81 mg by mouth daily.    [name] atenolol (TENORMIN) 25 MG tablet TAKE 1 TABLET DAILY  . atorvastatin (LIPITOR) 40 MG tablet TAKE 1 TABLET DAILY  . cholecalciferol (VITAMIN D) 1000 UNITS tablet Take 2,000 Units by mouth daily.   . fluticasone (FLONASE) 50 MCG/ACT nasal spray Place 2 sprays into the nose daily as needed.  [name] glipiZIDE (GLUCOTROL) 5 MG tablet TAKE 1 TABLET TWICE A DAY BEFORE A MEAL (DISCONTINUE NATEGLINIDE/STARLIX)  . JARDIANCE 25 MG TABS tablet TAKE ONE TABLET BY MOUTH EVERY DAY (MCK CARD)  . lisinopril (PRINIVIL,ZESTRIL) 20 MG tablet TAKE 1 TABLET DAILY  . metFORMIN (GLUCOPHAGE) 1000 MG tablet TAKE 1 TABLET TWICE DAILY WITH MEALS  .  ranitidine (ZANTAC) 150 MG tablet Take 150 mg by mouth 2 (two) times daily.   . simethicone (MYLICON) 80 MG chewable tablet Chew 2 tablets (160 mg total) by mouth 4 (four) times daily as needed for flatulence.  . temazepam (RESTORIL) 15 MG capsule TAKE 1 TABLET BY MOUTH AT BEDTIME AS NEEDED  . [DISCONTINUED] promethazine (PHENERGAN) 25 MG tablet Take 1 tablet (25 mg total) by mouth 2 (two) times daily as needed for nausea or  vomiting.   No facility-administered encounter medications on file as of 01/19/2017.      Review of Systems  Constitutional: Negative.   HENT: Negative.   Eyes: Negative.   Respiratory: Negative.   Cardiovascular: Negative.   Gastrointestinal: Negative.   Endocrine: Negative.   Genitourinary: Negative.   Musculoskeletal: Negative.   Skin: Negative.   Allergic/Immunologic: Negative.   Neurological: Negative.   Hematological: Negative.   Psychiatric/Behavioral: Negative.        Objective:   Physical Exam  Constitutional: He is oriented to person, place, and time. He appears well-developed and well-nourished.  Pleasant and alert with problems hearing  HENT:  Head: Normocephalic and atraumatic.  Right Ear: External ear normal.  Left Ear: External ear normal.  Nose: Nose normal.  Mouth/Throat: Oropharynx is clear and moist. No oropharyngeal exudate.  Diminished hearing more apparent than previously.  Eyes: Pupils are equal, round, and reactive to light. Conjunctivae and EOM are normal. Right eye exhibits no discharge. Left eye exhibits no discharge. No scleral icterus.  Neck: Normal range of motion. Neck supple. No thyromegaly present.  No bruits or thyromegaly or adenopathy  Cardiovascular: Normal rate, regular rhythm and intact distal pulses.  Exam reveals no friction rub.   Murmur heard. The heart has a regular rate and rhythm at 60/m with a grade 2/6 systolic ejection murmur  Pulmonary/Chest: Effort normal and breath sounds normal. No respiratory distress. He has no wheezes. He has no rales. He exhibits no tenderness.  Clear anteriorly and posteriorly no axillary adenopathy  Abdominal: Soft. Bowel sounds are normal. He exhibits distension. He exhibits no mass. There is no tenderness. There is no rebound and no guarding.  Some gas with exam but no liver or spleen enlargement. Laparoscopic scars for gallbladder present and healing well. No inguinal adenopathy.    Musculoskeletal: Normal range of motion. He exhibits no edema.  Lymphadenopathy:    He has no cervical adenopathy.  Neurological: He is alert and oriented to person, place, and time. He has normal reflexes. No cranial nerve deficit.  Skin: Skin is warm and dry. No rash noted.  Psychiatric: He has a normal mood and affect. His behavior is normal. Judgment and thought content normal.  Nursing note and vitals reviewed.  BP (!) 102/57 (BP Location: Left Arm)   Pulse 60   Temp 97.9 F (36.6 C) (Oral)   Ht '5\' 10"'$  (1.778 m)   Wt 205 lb (93 kg)   BMI 29.41 kg/m         Assessment & Plan:  1. Essential hypertension -The blood pressure is good today he will continue with current treatment - BMP8+EGFR - CBC with Differential/Platelet - Hepatic function panel  2. Uncontrolled type 2 diabetes mellitus without complication, without long-term current use of insulin ([place]) -The patient has been less active and then with the heat of the summer his blood sugars have been running higher than expected. This is also due to having had the ERCP and cholecystectomy done and he is trying to recuperate  from that which was done in May. - CBC with Differential/Platelet - Microalbumin / creatinine urine ratio - Bayer DCA Hb A1c Waived  3. Hyperlipidemia associated with type 2 diabetes mellitus ([place]) -Continue with current treatment and with as aggressive therapeutic lifestyle changes as possible pending results of lab work - CBC with Differential/Platelet - NMR, lipoprofile  4. Vitamin D deficiency -Continue with current treatment pending results of lab work - CBC with Differential/Platelet - VITAMIN D 25 Hydroxy (Vit-D Deficiency, Fractures)  5. Testosterone deficiency -Check prostate at next visit - CBC with Differential/Platelet  6. Benign prostatic hyperplasia, unspecified whether lower urinary tract symptoms present - CBC with Differential/Platelet  7. Gastroesophageal reflux disease,  esophagitis presence not specified -Follow-up with gastroenterology as discussed for gas bloating and diarrhea. - CBC with Differential/Platelet - Hepatic function panel  8. Decreased hearing of both ears -Referred to audiologist  9. Irritable bowel syndrome with diarrhea -Referred to gastroenterology  10. Gas bloat syndrome -Refer to gastroenterology  11. Ascending aortic aneurysm [facility]) -Follow-up with cardiology  12. Aortic stenosis, severe -Follow-up with cardiology in October  Patient Instructions  Continue current medications. Continue good therapeutic lifestyle changes which include good diet and exercise. Fall precautions discussed with patient. If an FOBT was given today- please return it to our front desk. If you are over 26 years old - you may need Prevnar 69 or the adult Pneumonia vaccine.  **Flu shots are available--- please call and schedule a FLU-CLINIC appointment**  After your visit with [place] today you will receive a survey in the mail or online from [employer] regarding your care with [place]. Please take a moment to fill this out. Your feedback is very important to [place] as you can help [place] better understand your patient needs as well as improve your experience and satisfaction. WE CARE ABOUT YOU!!!   We will arrange for you to have an appointment with the hearing specialist in [facility] should follow-up with the cardiologist as planned We will also arrange for you to follow-up with the gastroenterologist because of the gas bloating and irritable bowel syndrome with diarrhea.    Arrie Senate MD

## 2017-01-19 NOTE — Patient Instructions (Addendum)
Continue current medications. Continue good therapeutic lifestyle changes which include good diet and exercise. Fall precautions discussed with patient. If an FOBT was given today- please return it to our front desk. If you are over 62 years old - you may need Prevnar 13 or the adult Pneumonia vaccine.  **Flu shots are available--- please call and schedule a FLU-CLINIC appointment**  After your visit with us today you will receive a survey in the mail or online from [employer] regarding your care with us. Please take a moment to fill this out. Your feedback is very important to us as you can help us better understand your patient needs as well as improve your experience and satisfaction. WE CARE ABOUT YOU!!!   We will arrange for you to have an appointment [place] [facility] You should follow-up with the cardiologist as planned We will also arrange for you to follow-up with the gastroenterologist because of the gas bloating and irritable bowel syndrome with diarrhea.

## 2017-01-20 ENCOUNTER — Ambulatory Visit (HOSPITAL_COMMUNITY)
Admission: RE | Admit: 2017-01-20 | Discharge: 2017-01-20 | Disposition: A | Discharge status: Home / Self Care | Payer: BLUE CROSS/BLUE SHIELD | Source: Ambulatory Visit | Attending: Urology | Admitting: Urology

## 2017-01-20 ENCOUNTER — Other Ambulatory Visit: Payer: Self-pay | Admitting: Urology

## 2017-01-20 DIAGNOSIS — M47816 Spondylosis without myelopathy or radiculopathy, lumbar region: Secondary | ICD-10-CM | POA: Insufficient documentation

## 2017-01-20 DIAGNOSIS — N200 Calculus of kidney: Secondary | ICD-10-CM

## 2017-01-20 DIAGNOSIS — R109 Unspecified abdominal pain: Secondary | ICD-10-CM | POA: Diagnosis not present

## 2017-01-20 LAB — NMR, LIPOPROFILE
Cholesterol: 138 mg/dL (ref 100–199)
HDL Cholesterol by NMR: 28 mg/dL — ABNORMAL LOW (ref >39)
HDL Particle Number: 23.8 umol/L — ABNORMAL LOW (ref >=30.5)
LDL Particle Number: 1516 nmol/L — ABNORMAL HIGH (ref <1000)
LDL SIZE: 20.2 nm (ref >20.5)
LDL-C: 74 mg/dL (ref 0–99)
LP-IR SCORE: 92 — AB (ref <=45)
SMALL LDL PARTICLE NUMBER: 1025 nmol/L — AB (ref <=527)
TRIGLYCERIDES BY NMR: 180 mg/dL — AB (ref 0–149)

## 2017-01-20 LAB — MICROALBUMIN / CREATININE URINE RATIO
Creatinine, Urine: 79.6 mg/dL
MICROALBUM., U, RANDOM: 5.6 ug/mL
Microalb/Creat Ratio: 7 mg/g creat (ref 0.0–30.0)

## 2017-01-21 LAB — CBC WITH DIFFERENTIAL/PLATELET
BASOS ABS: 0 10*3/uL (ref 0.0–0.2)
Basos: 1 %
EOS (ABSOLUTE): 0.3 10*3/uL (ref 0.0–0.4)
Eos: 4 %
Hematocrit: 44.3 % (ref 37.5–51.0)
Hemoglobin: 15.4 g/dL (ref 13.0–17.7)
IMMATURE GRANS (ABS): 0 10*3/uL (ref 0.0–0.1)
IMMATURE GRANULOCYTES: 0 %
LYMPHS: 29 %
Lymphocytes Absolute: 2.1 10*3/uL (ref 0.7–3.1)
MCH: 32.5 pg (ref 26.6–33.0)
MCHC: 34.8 g/dL (ref 31.5–35.7)
MCV: 94 fL (ref 79–97)
MONOCYTES: 7 %
Monocytes Absolute: 0.5 10*3/uL (ref 0.1–0.9)
NEUTROS ABS: 4.5 10*3/uL (ref 1.4–7.0)
NEUTROS PCT: 59 %
PLATELETS: 212 10*3/uL (ref 150–379)
RBC: 4.74 x10E6/uL (ref 4.14–5.80)
RDW: 14.4 % (ref 12.3–15.4)
WBC: 7.5 10*3/uL (ref 3.4–10.8)

## 2017-01-21 LAB — HEPATIC FUNCTION PANEL
ALBUMIN: 4.9 g/dL — AB (ref 3.6–4.8)
ALT: 63 IU/L — AB (ref 0–44)
AST: 46 IU/L — ABNORMAL HIGH (ref 0–40)
Alkaline Phosphatase: 102 IU/L (ref 39–117)
BILIRUBIN TOTAL: 0.5 mg/dL (ref 0.0–1.2)
Bilirubin, Direct: 0.13 mg/dL (ref 0.00–0.40)
Total Protein: 7.7 g/dL (ref 6.0–8.5)

## 2017-01-21 LAB — BMP8+EGFR
BUN/Creatinine Ratio: 15 (ref 10–24)
BUN: 15 mg/dL (ref 8–27)
CALCIUM: 10 mg/dL (ref 8.6–10.2)
CO2: 22 mmol/L (ref 20–29)
Chloride: 102 mmol/L (ref 96–106)
Creatinine, Ser: 1 mg/dL (ref 0.76–1.27)
GFR calc non Af Amer: 81 mL/min/{1.73_m2} (ref >59)
GFR, EST AFRICAN AMERICAN: 93 mL/min/{1.73_m2} (ref >59)
Glucose: 152 mg/dL — ABNORMAL HIGH (ref 65–99)
Potassium: 4.2 mmol/L (ref 3.5–5.2)
Sodium: 143 mmol/L (ref 134–144)

## 2017-01-21 LAB — VITAMIN D 25 HYDROXY (VIT D DEFICIENCY, FRACTURES): Vit D, 25-Hydroxy: 37.2 ng/mL (ref 30.0–100.0)

## 2017-01-21 NOTE — Addendum Note (Signed)
Addended by: Tamera PuntWRAY, Juelle Dickmann S on: 01/21/2017 01:55 PM   Modules accepted: Orders

## 2017-01-22 ENCOUNTER — Encounter (INDEPENDENT_AMBULATORY_CARE_PROVIDER_SITE_OTHER): Payer: Self-pay | Admitting: Internal Medicine

## 2017-01-22 ENCOUNTER — Ambulatory Visit (INDEPENDENT_AMBULATORY_CARE_PROVIDER_SITE_OTHER): Payer: BLUE CROSS/BLUE SHIELD | Admitting: Internal Medicine

## 2017-01-22 VITALS — BP 120/68 | HR 72 | Temp 97.9°F | Ht 70.0 in | Wt 207.2 lb

## 2017-01-22 DIAGNOSIS — K219 Gastro-esophageal reflux disease without esophagitis: Secondary | ICD-10-CM | POA: Diagnosis not present

## 2017-01-22 MED ORDER — PANTOPRAZOLE SODIUM 40 MG PO TBEC: 40.0000 mg | DELAYED_RELEASE_TABLET | Freq: Every day | ORAL | 5 refills | Status: DC

## 2017-01-22 NOTE — Patient Instructions (Signed)
Rx for Protonix 40mg daily.  

## 2017-01-22 NOTE — Progress Notes (Signed)
Subjective:    Patient ID: [name] [name], male    DOB: Nov 20, 1955, 61 y.o.   MRN: [id number]  HPI Referred by Dr. Rudi Heaponald [place] for reflux/IBS, bloating. He has flatus and he burps. He has sharp pain in his abdomen. The pain will last for a day and then resolved.  Not related to food.  Says is worse with broccoli and cabbage.  Has a BM x  2 a day. Appetite is good. No weight loss.  Last colonoscopy 6 yrs ago Dr. Teena DunkBenson and was normal.    Underwent an ERCP in May of this for stones in CBD:     remained. Impression:               - Filling defects consistent with stones was seen                            on the cholangiogram.                           - Choledocholithiasis was found. Complete removal                            was accomplished by biliary sphincterotomy and                            basket extraction and dormia basket.                           - A biliary sphincterotomy was performed.     Review of Systems Past Medical History:  Diagnosis Date  . Aortic stenosis 8/09   Bicuspid aortic valve  . Arthritis 6/10   hand   . Atrial fibrillation (HCC)    post op only  . CAD (coronary artery disease) 8/09  . CTS (carpal tunnel syndrome)    bilateral   . Diastolic dysfunction 8/09  . Elevated LFTs   . Fatty liver disease, nonalcoholic   . Hyperlipidemia   . Hypertension   . Kidney stones   . Nasal congestion   . NIDDM (non-insulin dependent diabetes mellitus)    x15 yrs  . OSA on CPAP 2006  . Paresthesias in left hand 02/19/2004  . Paresthesias in right hand 02/19/2004  . Sinusitis   . Vitamin D deficiency 06/13/09    Past Surgical History:  Procedure Laterality Date  . CARPAL TUNNEL RELEASE     bilateral   . CORONARY ANGIOPLASTY WITH STENT PLACEMENT     to rt coronary atery (Dr. Degert-cardiologist)  . ERCP N/A 11/24/2016   Procedure: ENDOSCOPIC RETROGRADE CHOLANGIOPANCREATOGRAPHY (ERCP);  Surgeon: Malissa Hippoehman, Najeeb U, MD;  Location: [facility];   Service: Endoscopy;  Laterality: N/A;  . REMOVAL OF STONES N/A 11/24/2016   Procedure: REMOVAL OF STONES;  Surgeon: Malissa Hippoehman, Najeeb U, MD;  Location: [facility];  Service: Endoscopy;  Laterality: N/A;  . SPHINCTEROTOMY N/A 11/24/2016   Procedure: SPHINCTEROTOMY;  Surgeon: Malissa Hippoehman, Najeeb U, MD;  Location: [facility];  Service: Endoscopy;  Laterality: N/A;  . TISSUE AORTIC VALVE REPLACEMENT     2012    Allergies  Allergen Reactions  . Sulfa Antibiotics Nausea And Vomiting  . Hctz [Hydrochlorothiazide]     Nausea and headache   . Sulfonamide Derivatives  Current Outpatient Prescriptions on File Prior to Visit  Medication Sig Dispense Refill  . ANDROGEL PUMP 20.25 MG/ACT (1.62%) GEL APPLY 3 PUMPS TOPICALLY EVERY DAY - PDM COUPON ON FILE - JB 04/23/2016 75 g 2  . aspirin (SB LOW DOSE ASA EC) 81 MG EC tablet Take 81 mg by mouth daily.      [name] atenolol (TENORMIN) 25 MG tablet TAKE 1 TABLET DAILY 90 tablet 3  . atorvastatin (LIPITOR) 40 MG tablet TAKE 1 TABLET DAILY 90 tablet 1  . cholecalciferol (VITAMIN D) 1000 UNITS tablet Take 2,000 Units by mouth daily.     . fluticasone (FLONASE) 50 MCG/ACT nasal spray Place 2 sprays into the nose daily as needed.    [name] glipiZIDE (GLUCOTROL) 5 MG tablet TAKE 1 TABLET TWICE A DAY BEFORE A MEAL (DISCONTINUE NATEGLINIDE/STARLIX) (Patient taking differently: TAKE 1 TABLET one in am and one before dinner) 180 tablet 1  . JARDIANCE 25 MG TABS tablet TAKE ONE TABLET BY MOUTH EVERY DAY (MCK CARD) 30 tablet 0  . lisinopril (PRINIVIL,ZESTRIL) 20 MG tablet TAKE 1 TABLET DAILY 90 tablet 1  . metFORMIN (GLUCOPHAGE) 1000 MG tablet TAKE 1 TABLET TWICE DAILY WITH MEALS 180 tablet 1  . ranitidine (ZANTAC) 150 MG tablet Take 150 mg by mouth 2 (two) times daily.     . simethicone (MYLICON) 80 MG chewable tablet Chew 2 tablets (160 mg total) by mouth 4 (four) times daily as needed for flatulence. 30 tablet 0  . temazepam (RESTORIL) 15 MG capsule TAKE 1 TABLET BY MOUTH  AT BEDTIME AS NEEDED 30 capsule 2   No current facility-administered medications on file prior to visit.         Objective:   Physical Exam Blood pressure 120/68, pulse 72, temperature 97.9 F (36.6 C), height 5\' 10"  (1.778 m), weight 207 lb 3.2 oz (94 kg). Alert and oriented. Skin warm and dry. Oral mucosa is moist.   . Sclera anicteric, conjunctivae is pink. Thyroid not enlarged. No cervical lymphadenopathy. Lungs clear. Heart regular rate and rhythm.  Abdomen is soft. Bowel sounds are positive. No hepatomegaly. No abdominal masses felt. No tenderness.  No edema to lower extremities.          Assessment & Plan:  GERD. Am going to start him on Protonix 40mg  daily. Continue the Mylicon.

## 2017-01-25 ENCOUNTER — Other Ambulatory Visit: Payer: BLUE CROSS/BLUE SHIELD

## 2017-01-25 DIAGNOSIS — Z1211 Encounter for screening for malignant neoplasm of colon: Secondary | ICD-10-CM | POA: Diagnosis not present

## 2017-01-26 ENCOUNTER — Ambulatory Visit (INDEPENDENT_AMBULATORY_CARE_PROVIDER_SITE_OTHER): Payer: BLUE CROSS/BLUE SHIELD | Admitting: Urology

## 2017-01-26 DIAGNOSIS — N200 Calculus of kidney: Secondary | ICD-10-CM

## 2017-01-28 DIAGNOSIS — H25813 Combined forms of age-related cataract, bilateral: Secondary | ICD-10-CM | POA: Diagnosis not present

## 2017-01-30 LAB — FECAL OCCULT BLOOD, IMMUNOCHEMICAL: Fecal Occult Bld: NEGATIVE

## 2017-01-31 ENCOUNTER — Other Ambulatory Visit: Payer: Self-pay | Admitting: Family Medicine

## 2017-02-02 ENCOUNTER — Other Ambulatory Visit: Payer: Self-pay | Admitting: *Deleted

## 2017-02-03 MED ORDER — TESTOSTERONE 20.25 MG/ACT (1.62%) TD GEL: TRANSDERMAL | 2 refills | Status: DC

## 2017-02-03 NOTE — Telephone Encounter (Signed)
[facility]

## 2017-02-07 ENCOUNTER — Other Ambulatory Visit: Payer: Self-pay | Admitting: Family Medicine

## 2017-02-08 DIAGNOSIS — H903 Sensorineural hearing loss, bilateral: Secondary | ICD-10-CM | POA: Diagnosis not present

## 2017-02-08 DIAGNOSIS — E119 Type 2 diabetes mellitus without complications: Secondary | ICD-10-CM | POA: Diagnosis not present

## 2017-02-10 ENCOUNTER — Ambulatory Visit (INDEPENDENT_AMBULATORY_CARE_PROVIDER_SITE_OTHER): Payer: BLUE CROSS/BLUE SHIELD | Admitting: Pharmacist

## 2017-02-10 ENCOUNTER — Encounter: Payer: Self-pay | Admitting: Pharmacist

## 2017-02-10 DIAGNOSIS — R7989 Other specified abnormal findings of blood chemistry: Secondary | ICD-10-CM | POA: Diagnosis not present

## 2017-02-10 DIAGNOSIS — IMO0001 Reserved for inherently not codable concepts without codable children: Secondary | ICD-10-CM

## 2017-02-10 DIAGNOSIS — E1165 Type 2 diabetes mellitus with hyperglycemia: Secondary | ICD-10-CM

## 2017-02-10 DIAGNOSIS — R945 Abnormal results of liver function studies: Secondary | ICD-10-CM

## 2017-02-10 MED ORDER — EMPAGLIFLOZIN-LINAGLIPTIN 25-5 MG PO TABS: 1.0000 | ORAL_TABLET | Freq: Every day | ORAL | 2 refills | Status: DC

## 2017-02-10 NOTE — Progress Notes (Signed)
Patient ID: [name] [name], male   DOB: 03-25-1956, 61 y.o.   MRN: [id number]   Subjective:    [name] [name] is a 61 y.o. male referred by his PCP for evaluation and education related to uncontrolled Type 2 diabetes mellitus.  Current symptoms/problems include hyperglycemia and have been worsening. Symptoms have been present for 3 months.  Current diabetic medications include Jardiance 25mg  qd, metformin 1000mg  bid and glipizide 5mg  bid.   [name] [name] has recently has gallstone and cholecystectomy.  During this time he also has post ERCP pancreatitis.   Current monitoring regimen: checking BG 1-2 times per day Home blood sugar records: per patient report - did not bring in glucometer.  AM = 150's; after meal 200's Any episodes of hypoglycemia? no  Known diabetic complications: cardiovascular disease Cardiovascular risk factors: advanced age (older than 3655 for men, 3465 for women), diabetes mellitus, dyslipidemia, hypertension and male gender Eye exam current (within one year): yes Weight trend: stable Prior visit with CDE: yes - last visit was about 1 year ago Current diet: sometimes compliant with low CHO diet - limits breads and sweets. Has 2 glasses of sweet tea per day.  No sodas Current exercise: walking Medication Compliance?  Yes   Is He on ACE inhibitor or angiotensin II receptor blocker?  Yes  lisinopril (Prinivil)    The following portions of the patient's history were reviewed and updated as appropriate: allergies, current medications, past family history, past medical history, past social history, past surgical history and problem list.    Objective:    There were no vitals taken for this visit.   Last A1c = 9.0% (01/19/2017) A1c = 7.5% (04/10/2016)   BMP Latest Ref Rng & Units 01/19/2017 11/28/2016 11/26/2016  Glucose 65 - 99 mg/dL 829(F152(H) 85 621(H126(H)  BUN 8 - 27 mg/dL 15 7 8   Creatinine 0.76 - 1.27 mg/dL 0.861.00 5.780.78 4.690.89  BUN/Creat Ratio 10 - 24 15 - -  Sodium 134 - 144  mmol/L 143 142 140  Potassium 3.5 - 5.2 mmol/L 4.2 3.5 3.5  Chloride 96 - 106 mmol/L 102 108 106  CO2 20 - 29 mmol/L 22 25 25   Calcium 8.6 - 10.2 mg/dL 62.910.0 5.2(W8.8(L) 4.1(L8.2(L)       Assessment:    Diabetes Mellitus type 2 under inadequate control.    Plan:    1.  Rx changes: switch Jardiance to Glyxambi 25/5mg  - take 1 tablet daily.  Gave #3 week of samples and [facility]. [place] also given copay coupon.    Patient is to call office if BG not less than 200 in 2 week.   Consider adding long acting insulin is not improved. 2.  Education: Reviewed 'ABCs' of diabetes management (respective goals in parentheses):  A1C (<7), blood pressure (<130/80), and cholesterol (LDL <100). 3.  CHO counting diet discussed.  Reviewed CHO amount in various foods and how to read nutrition labels.  Discussed recommended serving sizes.  4.  Recommend check BG 1-2  times a day 5.   Recommended increase physical activity - goal is 150 minutes per week 6.  Follow up: 3 month

## 2017-02-11 LAB — HEPATIC FUNCTION PANEL
ALT: 52 IU/L — ABNORMAL HIGH (ref 0–44)
AST: 44 IU/L — ABNORMAL HIGH (ref 0–40)
Albumin: 4.6 g/dL (ref 3.6–4.8)
Alkaline Phosphatase: 85 IU/L (ref 39–117)
BILIRUBIN, DIRECT: 0.14 mg/dL (ref 0.00–0.40)
Bilirubin Total: 0.4 mg/dL (ref 0.0–1.2)
TOTAL PROTEIN: 7 g/dL (ref 6.0–8.5)

## 2017-02-14 ENCOUNTER — Other Ambulatory Visit: Payer: Self-pay | Admitting: Family Medicine

## 2017-02-18 ENCOUNTER — Encounter: Payer: Self-pay | Admitting: Family Medicine

## 2017-02-26 ENCOUNTER — Encounter: Payer: Self-pay | Admitting: Pharmacist Clinician (PhC)/ Clinical Pharmacy Specialist

## 2017-02-26 ENCOUNTER — Ambulatory Visit (INDEPENDENT_AMBULATORY_CARE_PROVIDER_SITE_OTHER): Payer: BLUE CROSS/BLUE SHIELD | Admitting: Pharmacist Clinician (PhC)/ Clinical Pharmacy Specialist

## 2017-02-26 DIAGNOSIS — IMO0001 Reserved for inherently not codable concepts without codable children: Secondary | ICD-10-CM

## 2017-02-26 DIAGNOSIS — E1165 Type 2 diabetes mellitus with hyperglycemia: Secondary | ICD-10-CM | POA: Diagnosis not present

## 2017-02-26 NOTE — Progress Notes (Signed)
Diabetes Follow-Up Visit Chief Complaint:  No chief complaint on file.    Exam Regularity:  RRR Edema:  neg  Polyuria:  neg  Polydipsia:  neg Polyphagia:  neg  BMI:  There is no height or weight on file to calculate BMI.   Weight changes:  none General Appearance:  alert, oriented, no acute distress Mood/Affect:  normal  HPI:  Type 2 diabetes for around 15 years.  No benefit from Januvia in past and side effects from [facility] so discontinued taking it  Low fat/carbohydrate diet?  No Nicotine Abuse?  No Medication Compliance?  Yes Exercise?  Yes Alcohol Abuse?  No  Home BG Monitoring:  Checking 2 times a day. Average:  180  High: 200  Low:  140   Lab Results  Component Value Date   HGBA1C 8.1 07/17/2015    Lab Results  Component Value Date   MICROALBUR 20 06/26/2014    Lab Results  Component Value Date   CHOL 138 01/19/2017   HDL 28 (L) 01/19/2017   LDLCALC 63 09/10/2016   TRIG 180 (H) 01/19/2017   CHOLHDL 4.1 09/10/2016      Assessment: 1.  Diabetes.  Type 2 diabetes with recent worsening control.   2.  Blood Pressure.  At goal 3.  Lipids.  Followed by Dr. Christell Constant patient on atovastatin 4.  Foot Care.  Reviewed today with patient 5.  Dental Care.  Cleanings every 6 months 6.  Eye Care/Exam.  Annual exam  Recommendations: 1.  Patient is counseled on appropriate foot care. 2.  BP goal < 130/80. 3.  LDL goal of < 100, HDL > 40 and TG < 150. 4.  Eye Exam yearly and Dental Exam every 6 months. 5.  Dietary recommendations:  Counseled patient for over 30 minutes on dietary changes to make.  The biggest change he will make is giving up drinking 5-6 glasses of sweet tea a day.  He otherwise has a good diet with healthy eating habits.  He is agreeable to using an artificial sweetner in his tea.   6.  Physical Activity recommendations:  Continue walking dog daily split up 20 minutes twice a day 7.  Medication recommendations at this time are as follows:  Continue metformin,  glipizide, and jardiance.  May stop jardiance if BG drop below 80mg /dL with giving up sweet tea and increasing exercise.  Would change glipizide to glimepiride in future when he runs out of this medication. 8.  Return to clinic in 3-4 months   Time spent counseling patient:  45 minutes  Physician time spent with patient:    Referring provider:  Dr. Christell Constant   PharmD:  Chari Manning, PharmD

## 2017-04-12 DIAGNOSIS — H903 Sensorineural hearing loss, bilateral: Secondary | ICD-10-CM | POA: Diagnosis not present

## 2017-04-22 ENCOUNTER — Encounter: Payer: Self-pay | Admitting: *Deleted

## 2017-04-23 ENCOUNTER — Ambulatory Visit (INDEPENDENT_AMBULATORY_CARE_PROVIDER_SITE_OTHER): Payer: BLUE CROSS/BLUE SHIELD | Admitting: *Deleted

## 2017-04-23 ENCOUNTER — Ambulatory Visit (INDEPENDENT_AMBULATORY_CARE_PROVIDER_SITE_OTHER): Payer: BLUE CROSS/BLUE SHIELD | Admitting: Cardiology

## 2017-04-23 ENCOUNTER — Encounter: Payer: Self-pay | Admitting: Cardiology

## 2017-04-23 VITALS — BP 106/60 | HR 68 | Ht 70.0 in | Wt 211.8 lb

## 2017-04-23 DIAGNOSIS — Z23 Encounter for immunization: Secondary | ICD-10-CM | POA: Diagnosis not present

## 2017-04-23 DIAGNOSIS — I712 Thoracic aortic aneurysm, without rupture: Secondary | ICD-10-CM | POA: Diagnosis not present

## 2017-04-23 DIAGNOSIS — I10 Essential (primary) hypertension: Secondary | ICD-10-CM

## 2017-04-23 DIAGNOSIS — E782 Mixed hyperlipidemia: Secondary | ICD-10-CM

## 2017-04-23 DIAGNOSIS — Z952 Presence of prosthetic heart valve: Secondary | ICD-10-CM

## 2017-04-23 DIAGNOSIS — I7121 Aneurysm of the ascending aorta, without rupture: Secondary | ICD-10-CM

## 2017-04-23 NOTE — Progress Notes (Signed)
Clinical Summary Mr. [name] [name] is a 61 y.o.male seen today for follow up of the following medical problems.   1. AVR - history of bicuspid AV, s/p bioprosthetic AVR in 2012 - has had some recent SOB. Walks regularly, can have some chest tightness with walking - tighthness on left side, 5-6/10. No other associated symptoms. Lasts few minutes, gets better with rest. Noticeable with walking up hill. Never occurs at rest. Symptoms started several months, stable in frequency and severity.   - echo 11/2016 LVEF 60-65%, no WMAs, normal diastolic function, mild gradient across AVR.  - a stress test was cancelled, as patient was undergoing extensive management for gallstones. Chest pain has resolved with resolution of his GI issues   - no recent SOB or DOE. Breathing is improving.    2. Ascending aortic aneurysm - mild aortic root dilatation at 4.1 to 4.2 cm by last MRI 10/2013, likely related to hx of bicuspid AV and potential coexisting aortopathy - CTA 10/2014 4.1 cm ascending aortic aneurysm.   - CTA 11/2016 stable 4 cm aneurysm - no recent chest apin  3. HTN - on higher dose of lisinopril had symptomatic low bp's, back down to 20mg  daily by pcp.  -he is compliant with bp meds   4. OSA - not compliant with CPAP due to discomfort. Has not been intrested in retyring  5. Hyperlipidemia -he remains compliant with lipitor - Last panel 01/2017 TC 138 TG 180 HDL 28 LDL 74  6. DM2 - followed by pcp - 01/2017 Hgb A1c 9   7. Pulmonary Nodules - stable by imaging.   8. Gallstones - followed by GI, recent ERCP, complicated by pos tERCP pancreatitis  9. Chest pain - no recent symptoms since gallbladder surgery. A stress test that was previously ordered has been cancelled Past Medical History:  Diagnosis Date  . Aortic stenosis 8/09   Bicuspid aortic valve  . Arthritis 6/10   hand   . Atrial fibrillation (HCC)    post op only  . Bile duct calculus with acute  cholecystitis   . CAD (coronary artery disease) 8/09  . Calculus of common bile duct with acute pancreatitis 11/2016  . CTS (carpal tunnel syndrome)    bilateral   . Diastolic dysfunction 8/09  . Elevated LFTs   . Fatty liver disease, nonalcoholic   . Hyperlipidemia   . Hypertension   . Kidney stones   . Nasal congestion   . NIDDM (non-insulin dependent diabetes mellitus)    x15 yrs  . OSA on CPAP 2006  . Paresthesias in left hand 02/19/2004  . Paresthesias in right hand 02/19/2004  . Sinusitis   . Vitamin D deficiency 06/13/09     Allergies  Allergen Reactions  . Glyxambi [Empagliflozin-Linagliptin] Diarrhea and Itching    He was able to take Jardiance alone with no side effects so it was the Tradjenta part that most likely gave him the reaction  . Hctz [Hydrochlorothiazide]     Nausea and headache   . Sulfa Antibiotics Nausea And Vomiting  . Sulfonamide Derivatives      Current Outpatient Prescriptions  Medication Sig Dispense Refill  . aspirin (SB LOW DOSE ASA EC) 81 MG EC tablet Take 81 mg by mouth daily.      [name] atenolol (TENORMIN) 25 MG tablet TAKE 1 TABLET DAILY 90 tablet 3  . atorvastatin (LIPITOR) 40 MG tablet TAKE 1 TABLET DAILY 90 tablet 1  . cholecalciferol (VITAMIN D) 1000 UNITS tablet  Take 2,000 Units by mouth daily.     . fluticasone (FLONASE) 50 MCG/ACT nasal spray Place 2 sprays into the nose daily as needed.    [name] glipiZIDE (GLUCOTROL) 5 MG tablet TAKE 1 TABLET TWICE A DAY BEFORE A MEAL (DISCONTINUE NATEGLINIDE/STARLIX) (Patient taking differently: TAKE 1 TABLET one in am and one before dinner) 180 tablet 1  . lisinopril (PRINIVIL,ZESTRIL) 20 MG tablet TAKE 1 TABLET DAILY 90 tablet 1  . metFORMIN (GLUCOPHAGE) 1000 MG tablet TAKE 1 TABLET TWICE DAILY WITH MEALS 180 tablet 1  . pantoprazole (PROTONIX) 40 MG tablet Take 1 tablet (40 mg total) by mouth daily. 30 tablet 5  . ranitidine (ZANTAC) 150 MG tablet Take 150 mg by mouth as needed.     . simethicone  (MYLICON) 80 MG chewable tablet Chew 2 tablets (160 mg total) by mouth 4 (four) times daily as needed for flatulence. (Patient not taking: Reported on 02/10/2017) 30 tablet 0  . temazepam (RESTORIL) 15 MG capsule TAKE 1 TABLET BY MOUTH AT BEDTIME AS NEEDED 30 capsule 2  . Testosterone (ANDROGEL PUMP) 20.25 MG/ACT (1.62%) GEL APPLY 3 PUMPS TOPICALLY EVERY DAY - PDM COUPON ON FILE - JB 04/23/2016 75 g 2   No current facility-administered medications for this visit.      Past Surgical History:  Procedure Laterality Date  . CARPAL TUNNEL RELEASE     bilateral   . CHOLECYSTECTOMY  11/2016  . CORONARY ANGIOPLASTY WITH STENT PLACEMENT     to rt coronary atery (Dr. Degert-cardiologist)  . ERCP N/A 11/24/2016   Procedure: ENDOSCOPIC RETROGRADE CHOLANGIOPANCREATOGRAPHY (ERCP);  Surgeon: Malissa Hippo, MD;  [facility];  Service: Endoscopy;  Laterality: N/A;  . REMOVAL OF STONES N/A 11/24/2016   Procedure: REMOVAL OF STONES;  Surgeon: Malissa Hippo, MD;  [facility];  Service: Endoscopy;  Laterality: N/A;  . SPHINCTEROTOMY N/A 11/24/2016   Procedure: SPHINCTEROTOMY;  Surgeon: Malissa Hippo, MD;  [facility];  Service: Endoscopy;  Laterality: N/A;  . TISSUE AORTIC VALVE REPLACEMENT     2012     Allergies  Allergen Reactions  . Glyxambi [Empagliflozin-Linagliptin] Diarrhea and Itching    He was able to take Jardiance alone with no side effects so it was the Tradjenta part that most likely gave him the reaction  . Hctz [Hydrochlorothiazide]     Nausea and headache   . Sulfa Antibiotics Nausea And Vomiting  . Sulfonamide Derivatives       Family History  Problem Relation Age of Onset  . Hearing loss Mother   . Alzheimer's disease Mother   . Hearing loss Father   . Diabetes Father   . Diabetes Unknown        Family History      Social History Mr. [name] [name] reports that he quit smoking about 14 years ago. His smoking use included Cigarettes. He  started smoking about 42 years ago. He has a 37.50 pack-year smoking history. He has never used smokeless tobacco. Mr. [name] [name] reports that he does not drink alcohol.   Review of Systems CONSTITUTIONAL: No weight loss, fever, chills, weakness or fatigue.  HEENT: Eyes: No visual loss, blurred vision, double vision or yellow sclerae.No hearing loss, sneezing, congestion, runny nose or sore throat.  SKIN: No rash or itching.  CARDIOVASCULAR: per hpi RESPIRATORY: No shortness of breath, cough or sputum.  GASTROINTESTINAL: No anorexia, nausea, vomiting or diarrhea. No abdominal pain or blood.  GENITOURINARY: No burning on urination, no  polyuria NEUROLOGICAL: No headache, dizziness, syncope, paralysis, ataxia, numbness or tingling in the extremities. No change in bowel or bladder control.  MUSCULOSKELETAL: No muscle, back pain, joint pain or stiffness.  LYMPHATICS: No enlarged nodes. No history of splenectomy.  PSYCHIATRIC: No history of depression or anxiety.  ENDOCRINOLOGIC: No reports of sweating, cold or heat intolerance. No polyuria or polydipsia.  [name]   Physical Examination Vitals:   04/23/17 1259  BP: 106/60  Pulse: 68  SpO2: 94%   Vitals:   04/23/17 1259  Weight: 211 lb 12.8 oz (96.1 kg)  Height: 5\' 10"  (1.778 m)    Gen: resting comfortably, no acute distress HEENT: no scleral icterus, pupils equal round and reactive, no palptable cervical adenopathy,  CV: RRR, no mr/g, no jvd Resp: Clear to auscultation bilaterally GI: abdomen is soft, non-tender, non-distended, normal bowel sounds, no hepatosplenomegaly MSK: extremities are warm, no edema.  Skin: warm, no rash Neuro:  no focal deficits Psych: appropriate affect   Diagnostic Studies 09/2012 MRI/MRA Chest Stable 4 cm ascending thoracic aneurysm  08/2010 Cath HEMODYNAMIC DATA: Right atrial pressures 7/5 with a mean of 4 mmHg,  right ventricular pressure is 24 with an EDP of 7 mmHg. Pulmonary  artery pressure is 20/7  with a mean of 13 mmHg. Pulmonary capillary  wedge pressure is 10/7 with a mean of 6 mmHg. Aortic pressure is 118/67  with a mean of 88 mmHg. Left ventricular pressure is 161 with an EDP of  12 mmHg. There is no significant mitral valve gradient. The aortic  valve mean gradient is 31 mmHg with calculated valve area of 0.8  centimeter squared. Thermodilution cardiac output is 4.4 L per minute  with an index of 2, Fick cardiac outputs 4.7 L per minute with an index  of 2.2.  ANGIOGRAPHIC DATA: The left ventricular angiography was performed in a  RAO view. This demonstrates normal left ventricular size and  contractility with normal systolic function. Ejection fraction is  estimated at 65%. The aortic valve is calcified with eccentric opening  and restrictive mobility.  The left coronary artery arises and distributes normally. The left main  coronary has 20% narrowing in the ostium and in the distal left main.  The left anterior descending artery is a tortuous vessel which has less  than 10% irregularities.  Left circumflex coronary artery is rise to first obtuse marginal vessel  which has a 40-50% stenosis proximally.  The right coronary artery is a dominant vessel, it has 20% disease in  the distal vessel. The stent in the midvessel was widely patent.  FINAL INTERPRETATION:  1. Nonobstructive atherosclerotic coronary artery disease.  2. Normal left ventricular function.  3. Severe aortic stenosis.  4. Normal right heart pressures.  PLAN: Proceed with aortic valve replacement.  10/2013 MRA Chest EXAM: MRA CHEST WITH OR WITHOUT CONTRAST  TECHNIQUE: Angiographic images of the chest were obtained using MRA technique without and with intravenous contrast.  CONTRAST: 20mL MULTIHANCE GADOBENATE DIMEGLUMINE 529 MG/ML IV SOLN  COMPARISON: CT UROGRAM dated 05/08/2011; MR MRA CHEST W/ OR W/O CM dated 10/07/2012; CT ANGIO CHEST dated 09/08/2010  FINDINGS: There  is stable aneurysmal dilatation of the aortic root that does not involve the sinuses of Valsalva. No aortic dissection is present. Artifact is noted from a prosthetic aortic valve. Diameter at the level of the sinuses of Valsalva is approximately 3.2 cm. Maximal caliber of the ascending thoracic aorta is 4.1- 4.2 cm. The proximal arch measures 3.8 cm. The distal arch  measures 2.6 cm. The descending thoracic aorta measures 2.6 cm. Proximal great vessels show stable and normal patency without anatomical variant.  The heart size is normal. No pleural or pericardial fluid is identified. No masses or lymphadenopathy are seen. Visualized spine shows no abnormalities.  IMPRESSION: Stable aneurysmal dilatation of the ascending thoracic aorta measuring 4.1- 4.2 cm in greatest diameter. No dissection is identified.  10/2014 CTA Chest IMPRESSION: 1. Mild fusiform aneurysmal dilatation of the ascending thoracic aorta measuring approximately 41 mm in maximal diameter, stable since the 2012 examination. 2. Stable sequela of prior median sternotomy and aortic valve replacement. 3. Coronary artery calcifications. 4. Punctate (approximately 5 mm) in determine right lower lobe pulmonary nodule, not definitely seen on the 20/2012 examination. If the patient is at high risk for bronchogenic carcinoma, follow-up chest CT at 6-12 months is recommended. If the patient is at low risk for bronchogenic carcinoma, follow-up chest CT at 12 months is recommended. This recommendation follows the consensus statement:  11/2015 CTA chest  IMPRESSION: Mild aneurysmal dilatation of the ascending aorta with a maximal diameter of 4.0 cm. This is not significantly changed.   11/2016 echo Study Conclusions  - Left ventricle: The cavity size was normal. Wall thickness was normal. Systolic function was normal. The estimated ejection fraction was in the range of 60% to 65%. Wall motion was  normal; there were no regional wall motion abnormalities. Left ventricular diastolic function parameters were normal. - Aortic valve: There isa 25-mm Edwards pericardial Magna-Ease valve model #3300TFX in the AV position. Mildly calcified annulus. Trileaflet; normal thickness leaflets. There is a mild gradient across the prosthetic valve. Mean gradient (S): 12 mm Hg. Mean gradient of 18 mmHg with pedhoff probe. Valve area (VTI): 1.71 cm^2. - Technically adequate study.  11/2016 CTA chest IMPRESSION: 1. Stable mild fusiform aneurysm of the ascending aorta, measuring maximum diameter of 4.0 cm. Recommend annual imaging followup by CTA or MRA. This recommendation follows 2010 ACCF/AHA/AATS/ACR/ASA/SCA/SCAI/SIR/STS/SVM Guidelines for the Diagnosis and Management of Patients with Thoracic Aortic Disease. Circulation. 2010; 121: [id number] 2. Long-term stability of pulmonary nodules, consistent with benign process.    Assessment and Plan  1. Aortic valve replacement/SOB/Chest pain - hx of bicuspid AV, s/p bioprosthetic AVR in 2012. Mild gradient across the valve by echo 11/2016 with overall normal function - no recent symptoms, continue to monitor.    2. Ascending aortic root aneurysm - aneurysm remains stable by recent imaging - we will continue to monitor.   3. HTN -bp is at goal, continue current meds   4. Hyperlipidemia - lipids are at goal, continue current meds      [name] [name], M.D.

## 2017-04-23 NOTE — Patient Instructions (Signed)

## 2017-04-25 ENCOUNTER — Other Ambulatory Visit: Payer: Self-pay | Admitting: Family Medicine

## 2017-04-26 ENCOUNTER — Ambulatory Visit (INDEPENDENT_AMBULATORY_CARE_PROVIDER_SITE_OTHER): Payer: BLUE CROSS/BLUE SHIELD | Admitting: Internal Medicine

## 2017-04-26 ENCOUNTER — Encounter (INDEPENDENT_AMBULATORY_CARE_PROVIDER_SITE_OTHER): Payer: Self-pay | Admitting: Internal Medicine

## 2017-04-26 VITALS — BP 104/52 | HR 60 | Temp 98.1°F | Ht 70.0 in | Wt 212.3 lb

## 2017-04-26 DIAGNOSIS — K219 Gastro-esophageal reflux disease without esophagitis: Secondary | ICD-10-CM | POA: Diagnosis not present

## 2017-04-26 NOTE — Progress Notes (Signed)
Subjective:    Patient ID: [name] [name], [name] [name]    DOB: 1955-11-20, 61 y.o.   MRN: [id number] Wt 207 in July. HT 70 in HPI Here today for f/u. Last seen in July for GERD. Started on Protonix 40mg  daily.  He has gained 5 pounds since his last visit. He says he is doing better. Acid reflux jis controlled with Protonix. Sometimes he has sharp pain in his rt upper quadrant. Pain occur about once a month.  Appetite is good. No weight loss.  Usually has a BM daily. No melena or BRRB Has been a diabetic x 15-3375yrs.   Underwent an ERCP in May of this for stones in WUJ:[id number]:remained. Impression: - Filling defects consistent with stones was seen  on the cholangiogram. - Choledocholithiasis was found. Complete removal  was accomplished by biliary sphincterotomy and  basket extraction and dormia basket. - A biliary sphincterotomy was performed.  Review of Systems Past Medical History:  Diagnosis Date  . Aortic stenosis 8/09   Bicuspid aortic valve  . Arthritis 6/10   hand   . Atrial fibrillation (HCC)    post op only  . Bile duct calculus with acute cholecystitis   . CAD (coronary artery disease) 8/09  . Calculus of common bile duct with acute pancreatitis 11/2016  . CTS (carpal tunnel syndrome)    bilateral   . Diastolic dysfunction 8/09  . Elevated LFTs   . Fatty liver disease, nonalcoholic   . Hyperlipidemia   . Hypertension   . Kidney stones   . Nasal congestion   . NIDDM (non-insulin dependent diabetes mellitus)    x15 yrs  . OSA on CPAP 2006  . Paresthesias in left hand 02/19/2004  . Paresthesias in right hand 02/19/2004  . Sinusitis   . Vitamin D deficiency 06/13/09    Past Surgical History:  Procedure Laterality Date  . CARPAL TUNNEL RELEASE     bilateral   . CHOLECYSTECTOMY  11/2016  . CORONARY ANGIOPLASTY WITH STENT  PLACEMENT     to rt coronary atery (Dr. Degert-cardiologist)  . ERCP N/A 11/24/2016   Procedure: ENDOSCOPIC RETROGRADE CHOLANGIOPANCREATOGRAPHY (ERCP);  Surgeon: Malissa Hippoehman, Najeeb U, MD;  [facility]: [facility];  Service: Endoscopy;  Laterality: N/A;  . REMOVAL OF STONES N/A 11/24/2016   Procedure: REMOVAL OF STONES;  Surgeon: Malissa Hippoehman, Najeeb U, MD;  Location: [facility];  Service: Endoscopy;  Laterality: N/A;  . SPHINCTEROTOMY N/A 11/24/2016   Procedure: SPHINCTEROTOMY;  Surgeon: Malissa Hippoehman, Najeeb U, MD;  Location: [facility];  Service: Endoscopy;  Laterality: N/A;  . TISSUE AORTIC VALVE REPLACEMENT     2012    Allergies  Allergen Reactions  . Glyxambi [Empagliflozin-Linagliptin] Diarrhea and Itching    He was able to take Jardiance alone with no side effects so it was the Tradjenta part that most likely gave him the reaction  . Hctz [Hydrochlorothiazide]     Nausea and headache   . Sulfa Antibiotics Nausea And Vomiting  . Sulfonamide Derivatives     Current Outpatient Prescriptions on File Prior to Visit  Medication Sig Dispense Refill  . aspirin EC 81 MG tablet Take 81 mg by mouth daily.    [name]. atenolol (TENORMIN) 25 MG tablet TAKE 1 TABLET DAILY 90 tablet 3  . atorvastatin (LIPITOR) 40 MG tablet TAKE 1 TABLET DAILY 90 tablet 1  . cholecalciferol (VITAMIN D) 1000 UNITS tablet Take 2,000 Units by mouth daily.     . empagliflozin (JARDIANCE) 25 MG TABS tablet  Take 25 mg by mouth daily.    . fluticasone (FLONASE) 50 MCG/ACT nasal spray Place 2 sprays into the nose daily as needed.    [name] glipiZIDE (GLUCOTROL) 5 MG tablet TAKE 1 TABLET TWICE A DAY BEFORE A MEAL (DISCONTINUE NATEGLINIDE/STARLIX) (Patient taking differently: TAKE 1 TABLET one in am and one before dinner) 180 tablet 1  . lisinopril (PRINIVIL,ZESTRIL) 20 MG tablet TAKE 1 TABLET DAILY 90 tablet 1  . metFORMIN (GLUCOPHAGE) 1000 MG tablet TAKE 1 TABLET TWICE DAILY WITH MEALS 180 tablet 1  . ranitidine (ZANTAC) 150 MG tablet Take  150 mg by mouth as needed.     . temazepam (RESTORIL) 15 MG capsule TAKE 1 TABLET BY MOUTH AT BEDTIME AS NEEDED 30 capsule 2  . Testosterone (ANDROGEL PUMP) 20.25 MG/ACT (1.62%) GEL APPLY 3 PUMPS TOPICALLY EVERY DAY - PDM COUPON ON FILE - JB 04/23/2016 75 g 2   No current facility-administered medications on file prior to visit.         Objective:   Physical Exam Blood pressure (!) 104/52, pulse 60, temperature 98.1 F (36.7 C), height 5\' 10"  (1.778 m), weight 212 lb 4.8 oz (96.3 kg). Alert and oriented. Skin warm and dry. Oral mucosa is moist.   . Sclera anicteric, conjunctivae is pink. Thyroid not enlarged. No cervical lymphadenopathy. Lungs clear. Heart regular rate and rhythm.  Abdomen is soft. Bowel sounds are positive. No hepatomegaly. No abdominal masses felt. No tenderness.  No edema to lower extremities.           Assessment & Plan:  GERD controlled with Protonix.  OV 1 year.

## 2017-04-26 NOTE — Patient Instructions (Signed)
Continue the Protonix. OV in 1 year.  

## 2017-05-11 ENCOUNTER — Other Ambulatory Visit: Payer: Self-pay | Admitting: Family Medicine

## 2017-05-11 NOTE — Telephone Encounter (Signed)
Last seen 02/26/17  DWM  If approved [facility]   [phone]

## 2017-05-16 ENCOUNTER — Other Ambulatory Visit: Payer: Self-pay | Admitting: Family Medicine

## 2017-05-17 NOTE — Telephone Encounter (Signed)
OV 06/03/17 

## 2017-05-20 ENCOUNTER — Other Ambulatory Visit: Payer: Self-pay | Admitting: *Deleted

## 2017-05-20 MED ORDER — EMPAGLIFLOZIN 25 MG PO TABS: 25.0000 mg | ORAL_TABLET | Freq: Every day | ORAL | 0 refills | Status: DC

## 2017-06-03 ENCOUNTER — Ambulatory Visit (INDEPENDENT_AMBULATORY_CARE_PROVIDER_SITE_OTHER): Payer: BLUE CROSS/BLUE SHIELD | Admitting: Family Medicine

## 2017-06-03 ENCOUNTER — Encounter: Payer: Self-pay | Admitting: Family Medicine

## 2017-06-03 VITALS — BP 130/64 | HR 61 | Temp 97.2°F | Ht 70.0 in | Wt 212.0 lb

## 2017-06-03 DIAGNOSIS — I10 Essential (primary) hypertension: Secondary | ICD-10-CM

## 2017-06-03 DIAGNOSIS — I482 Chronic atrial fibrillation, unspecified: Secondary | ICD-10-CM

## 2017-06-03 DIAGNOSIS — I712 Thoracic aortic aneurysm, without rupture: Secondary | ICD-10-CM | POA: Diagnosis not present

## 2017-06-03 DIAGNOSIS — E1169 Type 2 diabetes mellitus with other specified complication: Secondary | ICD-10-CM

## 2017-06-03 DIAGNOSIS — N400 Enlarged prostate without lower urinary tract symptoms: Secondary | ICD-10-CM | POA: Diagnosis not present

## 2017-06-03 DIAGNOSIS — E349 Endocrine disorder, unspecified: Secondary | ICD-10-CM | POA: Diagnosis not present

## 2017-06-03 DIAGNOSIS — K760 Fatty (change of) liver, not elsewhere classified: Secondary | ICD-10-CM | POA: Diagnosis not present

## 2017-06-03 DIAGNOSIS — R918 Other nonspecific abnormal finding of lung field: Secondary | ICD-10-CM

## 2017-06-03 DIAGNOSIS — K219 Gastro-esophageal reflux disease without esophagitis: Secondary | ICD-10-CM | POA: Diagnosis not present

## 2017-06-03 DIAGNOSIS — E559 Vitamin D deficiency, unspecified: Secondary | ICD-10-CM | POA: Diagnosis not present

## 2017-06-03 DIAGNOSIS — E1165 Type 2 diabetes mellitus with hyperglycemia: Secondary | ICD-10-CM | POA: Diagnosis not present

## 2017-06-03 DIAGNOSIS — IMO0001 Reserved for inherently not codable concepts without codable children: Secondary | ICD-10-CM

## 2017-06-03 DIAGNOSIS — E785 Hyperlipidemia, unspecified: Secondary | ICD-10-CM | POA: Diagnosis not present

## 2017-06-03 DIAGNOSIS — I7121 Aneurysm of the ascending aorta, without rupture: Secondary | ICD-10-CM

## 2017-06-03 DIAGNOSIS — L570 Actinic keratosis: Secondary | ICD-10-CM

## 2017-06-03 LAB — URINALYSIS, COMPLETE
BILIRUBIN UA: NEGATIVE
KETONES UA: NEGATIVE
LEUKOCYTES UA: NEGATIVE
Nitrite, UA: NEGATIVE
PROTEIN UA: NEGATIVE
RBC UA: NEGATIVE
SPEC GRAV UA: 1.02 (ref 1.005–1.030)
Urobilinogen, Ur: 0.2 mg/dL (ref 0.2–1.0)
pH, UA: 5 (ref 5.0–7.5)

## 2017-06-03 LAB — BAYER DCA HB A1C WAIVED: HB A1C (BAYER DCA - WAIVED): 7.9 % — ABNORMAL HIGH (ref <7.0)

## 2017-06-03 LAB — MICROSCOPIC EXAMINATION
BACTERIA UA: NONE SEEN
Epithelial Cells (non renal): NONE SEEN /hpf (ref 0–10)
RBC, UA: NONE SEEN /hpf (ref 0–?)
Renal Epithel, UA: NONE SEEN /hpf
WBC, UA: NONE SEEN /hpf (ref 0–?)

## 2017-06-03 NOTE — Patient Instructions (Addendum)
Continue current medications. Continue good therapeutic lifestyle changes which include good diet and exercise. Fall precautions discussed with patient. If an FOBT was given today- please return it to our front desk. If you are over 62 years old - you may need Prevnar 13 or the adult Pneumonia vaccine.  **Flu shots are available--- please call and schedule a FLU-CLINIC appointment**  After your visit with us today you will receive a survey in the mail or online from [employer] regarding your care with us. Please take a moment to fill this out. Your feedback is very important to us as you can help us better understand your patient needs as well as improve your experience and satisfaction. WE CARE ABOUT YOU!!!   Avoid the use of an overhead fan if possible Use nasal saline as needed for hydration of nasal passages Drink plenty of water and fluids as these are good for your body to function well and also good to prevent constipation Stay active physically Check feet regularly Continue aggressive therapeutic lifestyle changes We will call with results of the lab work as soon as those results become available

## 2017-06-03 NOTE — Progress Notes (Signed)
Subjective:    Patient ID: [name] [name], male    DOB: Feb 21, 1956, 62 y.o.   MRN: [id number]  HPI Pt here for follow up and management of chronic medical problems which includes hyperlipidemia, diabetes, and hypertension. He is taking medication regularly.  The patient is doing well overall.  He is concerned about some skin lesions on his head and face.  He will get lab work done today.  He does have a history of an ascending aortic aneurysm and pulmonary nodules with aortic valve replacement.  He also has a history of GERD.  He sees the cardiologist regularly.  The initial blood pressure today was elevated but on repeat was 130/64.  He is diabetic.  He is on AndroGel because of testosterone deficiency. Patient did see the cardiologist recently and he was seeing him more this year because he was having shortness of breath issues and he says since he has had his cholecystectomy that the shortness of breath issues are better.  He plans to see the cardiologist again in April.  He denies currently any chest pain or shortness of breath.  He still has problems with constipation but not as often as he used to and he plans to try to drink more water and fluids.  There is no family history of colon cancer and his next colonoscopy is not due until 2022.  He has not seen any blood in the stool or had any.  He does take a stool softener.  He will try to drink more water.  He is passing his water without problems.  He says that his sex life is okay he is on AndroGel 3 pumps daily.  His blood sugars have been better recently running around 120 fasting.  His last eye exam was back early in the summer.   Patient Active Problem List   Diagnosis Date Noted  . Common bile duct stone 11/24/2016  . Abdominal pain 11/24/2016  . Pancreatitis, acute 11/24/2016  . Pulmonary nodules 11/18/2016  . Abnormal chest CT 11/14/2016  . Type 2 diabetes mellitus with hyperlipidemia ([place]) 12/04/2015  . Sinus congestion 02/18/2015    . GERD (gastroesophageal reflux disease) 03/08/2014  . NAFLD (nonalcoholic fatty liver disease) 03/08/2014  . Elevated liver enzymes 03/08/2014  . Ascending aortic aneurysm ([place]) 12/12/2012  . INSOMNIA 09/15/2010  . Atrial fibrillation, chronic ([place]) 09/15/2010  . CAD (coronary artery disease) 07/09/2009  . AORTIC VALVE DISORDERS 05/27/2009  . OSTEOARTHRITIS, LOCALIZED 05/27/2009  . Diabetes mellitus type 2, uncontrolled, without complications ([place]) 89/38/1017  . Hyperlipidemia with target LDL less than 70 05/24/2009  . Hypokalemia 05/24/2009  . Essential hypertension 05/24/2009  . UNSPECIFIED SLEEP APNEA 05/24/2009   Outpatient Encounter Medications as of 06/03/2017  Medication Sig  . ANDROGEL PUMP 20.25 MG/ACT (1.62%) GEL Apply THREE pumps DAILY AS DIRECTED (COUPON CARD ON FILE)  . aspirin EC 81 MG tablet Take 81 mg by mouth daily.  [name] atenolol (TENORMIN) 25 MG tablet TAKE 1 TABLET DAILY  . atorvastatin (LIPITOR) 40 MG tablet TAKE 1 TABLET DAILY  . cholecalciferol (VITAMIN D) 1000 UNITS tablet Take 2,000 Units by mouth daily.   . empagliflozin (JARDIANCE) 25 MG TABS tablet Take 25 mg daily by mouth.  . fluticasone (FLONASE) 50 MCG/ACT nasal spray Place 2 sprays into the nose daily as needed.  [name] glipiZIDE (GLUCOTROL) 5 MG tablet TAKE 1 TABLET TWICE A DAY BEFORE A MEAL (DISCONTINUE NATEGLINIDE/STARLIX)  . lisinopril (PRINIVIL,ZESTRIL) 20 MG tablet TAKE 1 TABLET DAILY  .  metFORMIN (GLUCOPHAGE) 1000 MG tablet TAKE 1 TABLET TWICE DAILY WITH MEALS  . pantoprazole (PROTONIX) 40 MG tablet Take 40 mg by mouth daily.  . promethazine (PHENERGAN) 25 MG tablet TAKE 1 TABLET EVERY 4 HOURS AS NEEDED  . ranitidine (ZANTAC) 150 MG tablet Take 150 mg by mouth as needed.   . temazepam (RESTORIL) 15 MG capsule TAKE 1 TABLET BY MOUTH AT BEDTIME AS NEEDED   No facility-administered encounter medications on file as of 06/03/2017.       Review of Systems  Constitutional: Negative.   HENT: Negative.    Eyes: Negative.   Respiratory: Negative.   Cardiovascular: Negative.   Gastrointestinal: Negative.   Endocrine: Negative.   Genitourinary: Negative.   Musculoskeletal: Negative.   Skin: Negative.        Lesion on face and head  Allergic/Immunologic: Negative.   Neurological: Negative.   Hematological: Negative.   Psychiatric/Behavioral: Negative.        Objective:   Physical Exam  Constitutional: He is oriented to person, place, and time. He appears well-developed and well-nourished.  The patient is pleasant and alert and somewhat hard of hearing.  HENT:  Head: Normocephalic and atraumatic.  Right Ear: External ear normal.  Left Ear: External ear normal.  Mouth/Throat: Oropharynx is clear and moist. No oropharyngeal exudate.  Slight nasal congestion bilaterally  Eyes: Conjunctivae and EOM are normal. Pupils are equal, round, and reactive to light. Right eye exhibits no discharge. Left eye exhibits no discharge. No scleral icterus.  Up-to-date on eye exams  Neck: Normal range of motion. Neck supple. No thyromegaly present.  No bruits thyromegaly or anterior cervical adenopathy  Cardiovascular: Normal rate, regular rhythm, normal heart sounds and intact distal pulses. Exam reveals no friction rub.  No murmur heard. The heart is regular at 60/min  Pulmonary/Chest: Effort normal and breath sounds normal. No respiratory distress. He has no wheezes. He has no rales. He exhibits no tenderness.  No axillary adenopathy and chest is clear anteriorly and posteriorly  Abdominal: Soft. Bowel sounds are normal. He exhibits no mass. There is no tenderness. There is no rebound and no guarding.  No abdominal tenderness masses organ enlargement or bruits.  No inguinal adenopathy.  Laparoscopic scars for cholecystectomy are present.  Genitourinary: Rectum normal and penis normal.  Genitourinary Comments: The prostate is slightly enlarged but smooth there were no rectal masses.  External  genitalia were within normal limits with no inguinal hernias.  Musculoskeletal: Normal range of motion. He exhibits no edema.  Bilateral bunions and dry skin on feet  Lymphadenopathy:    He has no cervical adenopathy.  Neurological: He is alert and oriented to person, place, and time. He has normal reflexes. No cranial nerve deficit.  Skin: Skin is warm and dry. No rash noted.  2 actinic keratoses on the scalp and one warty type lesion on the right hand were noted today and cryotherapy was performed on all 3 lesions.  Psychiatric: He has a normal mood and affect. His behavior is normal. Judgment and thought content normal.  Nursing note and vitals reviewed.   BP (!) 143/71 (BP Location: Left Arm)   Pulse 61   Temp (!) 97.2 F (36.2 C) (Oral)   Ht '5\' 10"'$  (1.778 m)   Wt 212 lb (96.2 kg)   BMI 30.42 kg/m        Assessment & Plan:  1. Uncontrolled type 2 diabetes mellitus without complication, without long-term current use of insulin (HCC) -Blood sugars at  home of been running around 120 and this is greatly improved and we hope that the A1c will also be improved.. - CBC with Differential/Platelet - Bayer DCA Hb A1c Waived  2. Essential hypertension -Blood pressure is good he will continue with current treatment - BMP8+EGFR - CBC with Differential/Platelet - Hepatic function panel  3. Hyperlipidemia associated with type 2 diabetes mellitus ([place]) -Continue with current treatment pending results of lab work along with aggressive therapeutic lifestyle changes - CBC with Differential/Platelet - Lipid panel  4. Vitamin D deficiency -Continue with vitamin D replacement - CBC with Differential/Platelet - VITAMIN D 25 Hydroxy (Vit-D Deficiency, Fractures)  5. Testosterone deficiency -Continue with AndroGel 3 pumps daily - CBC with Differential/Platelet - PSA, total and free - Testosterone,Free and Total - Urinalysis, Complete  6. Benign prostatic hyperplasia, unspecified  whether lower urinary tract symptoms present -The patient has no complaints today with voiding. - CBC with Differential/Platelet - PSA, total and free - Testosterone,Free and Total - Urinalysis, Complete  7. Gastroesophageal reflux disease, esophagitis presence not specified -No complaints with reflux other than ongoing constipation issues for which he takes a stool softener - CBC with Differential/Platelet - Hepatic function panel  8. NAFLD (nonalcoholic fatty liver disease) -Hopefully recheck of liver function test will be improved especially after having cholecystectomy.  9. Pulmonary nodules -The patient gets yearly CT scans because of an ascending aortic aneurysm and these pulmonary nodules are evaluated at that time.  10. Atrial fibrillation, chronic ([place]) -Continue follow-up with cardiology  11. Ascending aortic aneurysm ([place]) -Continue follow-up with cardiology  12. Actinic keratoses -Cryotherapy to 3 lesions one on the right hand and 2 on the scalp accomplished without problems.  Patient Instructions  Continue current medications. Continue good therapeutic lifestyle changes which include good diet and exercise. Fall precautions discussed with patient. If an FOBT was given today- please return it to our front desk. If you are over 58 years old - you may need Prevnar 49 or the adult Pneumonia vaccine.  **Flu shots are available--- please call and schedule a FLU-CLINIC appointment**  After your visit with [place] today you will receive a survey in the mail or online from [employer] regarding your care with [place]. Please take a moment to fill this out. Your feedback is very important to [place] as you can help [place] better understand your patient needs as well as improve your experience and satisfaction. WE CARE ABOUT YOU!!!   Avoid the use of an overhead fan if possible Use nasal saline as needed for hydration of nasal passages Drink plenty of water and fluids as these are good for  your body to function well and also good to prevent constipation Stay active physically Check feet regularly Continue aggressive therapeutic lifestyle changes We will call with results of the lab work as soon as those results become available    Arrie Senate MD

## 2017-06-04 LAB — BMP8+EGFR
BUN / CREAT RATIO: 19 (ref 10–24)
BUN: 16 mg/dL (ref 8–27)
CALCIUM: 9.6 mg/dL (ref 8.6–10.2)
CO2: 20 mmol/L (ref 20–29)
Chloride: 102 mmol/L (ref 96–106)
Creatinine, Ser: 0.83 mg/dL (ref 0.76–1.27)
GFR calc Af Amer: 110 mL/min/{1.73_m2} (ref >59)
GFR, EST NON AFRICAN AMERICAN: 95 mL/min/{1.73_m2} (ref >59)
Glucose: 171 mg/dL — ABNORMAL HIGH (ref 65–99)
Potassium: 4.1 mmol/L (ref 3.5–5.2)
Sodium: 143 mmol/L (ref 134–144)

## 2017-06-04 LAB — CBC WITH DIFFERENTIAL/PLATELET
Basophils Absolute: 0 10*3/uL (ref 0.0–0.2)
Basos: 0 %
EOS (ABSOLUTE): 0.1 10*3/uL (ref 0.0–0.4)
EOS: 3 %
HEMATOCRIT: 44.5 % (ref 37.5–51.0)
HEMOGLOBIN: 15.2 g/dL (ref 13.0–17.7)
IMMATURE GRANS (ABS): 0 10*3/uL (ref 0.0–0.1)
Immature Granulocytes: 0 %
LYMPHS ABS: 1.7 10*3/uL (ref 0.7–3.1)
Lymphs: 34 %
MCH: 32.4 pg (ref 26.6–33.0)
MCHC: 34.2 g/dL (ref 31.5–35.7)
MCV: 95 fL (ref 79–97)
MONOCYTES: 7 %
Monocytes Absolute: 0.3 10*3/uL (ref 0.1–0.9)
Neutrophils Absolute: 2.7 10*3/uL (ref 1.4–7.0)
Neutrophils: 56 %
Platelets: 215 10*3/uL (ref 150–379)
RBC: 4.69 x10E6/uL (ref 4.14–5.80)
RDW: 14.2 % (ref 12.3–15.4)
WBC: 4.9 10*3/uL (ref 3.4–10.8)

## 2017-06-04 LAB — TESTOSTERONE,FREE AND TOTAL
Testosterone, Free: 11.3 pg/mL (ref 6.6–18.1)
Testosterone: 370 ng/dL (ref 264–916)

## 2017-06-04 LAB — PSA, TOTAL AND FREE
PSA FREE: 0.18 ng/mL
PSA, Free Pct: 60 %
Prostate Specific Ag, Serum: 0.3 ng/mL (ref 0.0–4.0)

## 2017-06-04 LAB — VITAMIN D 25 HYDROXY (VIT D DEFICIENCY, FRACTURES): Vit D, 25-Hydroxy: 34.3 ng/mL (ref 30.0–100.0)

## 2017-06-04 LAB — HEPATIC FUNCTION PANEL
ALK PHOS: 80 IU/L (ref 39–117)
ALT: 78 IU/L — AB (ref 0–44)
AST: 40 IU/L (ref 0–40)
Albumin: 4.8 g/dL (ref 3.6–4.8)
BILIRUBIN, DIRECT: 0.18 mg/dL (ref 0.00–0.40)
Bilirubin Total: 0.5 mg/dL (ref 0.0–1.2)
TOTAL PROTEIN: 7.5 g/dL (ref 6.0–8.5)

## 2017-06-04 LAB — LIPID PANEL
CHOL/HDL RATIO: 4.2 ratio (ref 0.0–5.0)
Cholesterol, Total: 125 mg/dL (ref 100–199)
HDL: 30 mg/dL — AB (ref >39)
LDL CALC: 54 mg/dL (ref 0–99)
TRIGLYCERIDES: 206 mg/dL — AB (ref 0–149)
VLDL CHOLESTEROL CAL: 41 mg/dL — AB (ref 5–40)

## 2017-06-23 ENCOUNTER — Other Ambulatory Visit: Payer: Self-pay | Admitting: *Deleted

## 2017-06-23 MED ORDER — EMPAGLIFLOZIN 25 MG PO TABS: 25.0000 mg | ORAL_TABLET | Freq: Every day | ORAL | 0 refills | Status: DC

## 2017-07-30 DIAGNOSIS — H40013 Open angle with borderline findings, low risk, bilateral: Secondary | ICD-10-CM | POA: Diagnosis not present

## 2017-08-08 ENCOUNTER — Other Ambulatory Visit: Payer: Self-pay | Admitting: Family Medicine

## 2017-08-22 ENCOUNTER — Other Ambulatory Visit: Payer: Self-pay | Admitting: Family Medicine

## 2017-09-19 ENCOUNTER — Other Ambulatory Visit: Payer: Self-pay | Admitting: Family Medicine

## 2017-10-12 ENCOUNTER — Encounter: Payer: Self-pay | Admitting: Family Medicine

## 2017-10-12 ENCOUNTER — Ambulatory Visit (INDEPENDENT_AMBULATORY_CARE_PROVIDER_SITE_OTHER): Payer: BLUE CROSS/BLUE SHIELD | Admitting: Family Medicine

## 2017-10-12 VITALS — BP 102/59 | HR 64 | Temp 97.5°F | Ht 70.0 in | Wt 208.0 lb

## 2017-10-12 DIAGNOSIS — E559 Vitamin D deficiency, unspecified: Secondary | ICD-10-CM | POA: Diagnosis not present

## 2017-10-12 DIAGNOSIS — K219 Gastro-esophageal reflux disease without esophagitis: Secondary | ICD-10-CM

## 2017-10-12 DIAGNOSIS — K760 Fatty (change of) liver, not elsewhere classified: Secondary | ICD-10-CM

## 2017-10-12 DIAGNOSIS — I712 Thoracic aortic aneurysm, without rupture: Secondary | ICD-10-CM

## 2017-10-12 DIAGNOSIS — I7121 Aneurysm of the ascending aorta, without rupture: Secondary | ICD-10-CM

## 2017-10-12 DIAGNOSIS — E1169 Type 2 diabetes mellitus with other specified complication: Secondary | ICD-10-CM

## 2017-10-12 DIAGNOSIS — E349 Endocrine disorder, unspecified: Secondary | ICD-10-CM

## 2017-10-12 DIAGNOSIS — I10 Essential (primary) hypertension: Secondary | ICD-10-CM

## 2017-10-12 DIAGNOSIS — E1165 Type 2 diabetes mellitus with hyperglycemia: Secondary | ICD-10-CM | POA: Diagnosis not present

## 2017-10-12 DIAGNOSIS — N400 Enlarged prostate without lower urinary tract symptoms: Secondary | ICD-10-CM | POA: Diagnosis not present

## 2017-10-12 DIAGNOSIS — E785 Hyperlipidemia, unspecified: Secondary | ICD-10-CM | POA: Diagnosis not present

## 2017-10-12 DIAGNOSIS — J301 Allergic rhinitis due to pollen: Secondary | ICD-10-CM

## 2017-10-12 DIAGNOSIS — IMO0001 Reserved for inherently not codable concepts without codable children: Secondary | ICD-10-CM

## 2017-10-12 LAB — BAYER DCA HB A1C WAIVED: HB A1C (BAYER DCA - WAIVED): 8.5 % — ABNORMAL HIGH (ref <7.0)

## 2017-10-12 MED ORDER — TEMAZEPAM 15 MG PO CAPS: ORAL_CAPSULE | ORAL | 2 refills | Status: DC

## 2017-10-12 NOTE — Patient Instructions (Addendum)
Medicare Annual Wellness Visit  [facility] and the medical providers at [facility] strive to bring you the best medical care.  In doing so we not only want to address your current medical conditions and concerns but also to detect new conditions early and prevent illness, disease and health-related problems.    Medicare offers a yearly Wellness Visit which allows our clinical staff to assess your need for preventative services including immunizations, lifestyle education, counseling to decrease risk of preventable diseases and screening for fall risk and other medical concerns.    This visit is provided free of charge (no copay) for all Medicare recipients. The clinical pharmacists at [facility] have begun to conduct these Wellness Visits which will also include a thorough review of all your medications.    As you primary medical provider recommend that you make an appointment for your Annual Wellness Visit if you have not done so already this year.  You may set up this appointment before you leave today or you may call back [phone] and schedule an appointment.  Please make sure when you call that you mention that you are scheduling your Annual Wellness Visit with the clinical pharmacist so that the appointment may be made for the proper length of time.     Continue current medications. Continue good therapeutic lifestyle changes which include good diet and exercise. Fall precautions discussed with patient. If an FOBT was given today- please return it to our front desk. If you are over 63 years old - you may need Prevnar 13 or the adult Pneumonia vaccine.  **Flu shots are available--- please call and schedule a FLU-CLINIC appointment**  After your visit with us today you will receive a survey in the mail or online from [employer] regarding your care with us. Please take a moment to fill this out. Your feedback is very  important to us as you can help us better understand your patient needs as well as improve your experience and satisfaction. WE CARE ABOUT YOU!!!   Follow-up with cardiology as planned Continue to see ophthalmologist yearly Keep moisturizer on the plantar surface of both feet to keep skin from cracking open Continue to monitor blood sugars regularly Avoid sedating antihistamines as much as possible and if other medicines are needed besides Flonase for allergic rhinitis use Claritin or Allegra

## 2017-10-12 NOTE — Progress Notes (Signed)
Subjective:    Patient ID: [name] [name], male    DOB: Apr 10, 1956, 62 y.o.   MRN: [id number]  HPI Pt here for follow up and management of chronic medical problems which includes hypertension, diabetes, and hyperlipidemia.  The patient also has testosterone deficiency and allergic rhinitis.  He is taking medication regularly.  Patient is doing well today with no specific complaints.  He is in need for a refill of his temazepam.  He will get lab work today.  His vital signs are stable and his weight is down 4 pounds compared to the last weight.  His BMI is 30.42.  The patient is pleasant and doing well overall.  He has an upcoming appointment this month with his cardiologist.  He denies any chest pain or shortness of breath except he does have some chest tightness and shortness of breath when he is exerting himself more.  He will discussed this with the cardiologist.  He otherwise does not have any chest pain pressure or tightness or shortness of breath.  He has no trouble with his stomach with swallowing heartburn indigestion nausea vomiting diarrhea.  He does occasionally have some bright red blood in the stool if he sits on the toilet for a long period of time.  There is no family history of colon cancer and as far as he knows when he had his last colonoscopy in November 2012 he was told to have another colonoscopy 10 years later.  That would be in 2022.  He is passing his water without problems and still using his AndroGel.  He is not due for rectal exam on this visit and that will be done at the next visit.  He says that his blood sugars at home usually run around 150 fasting and as high as 180 during the day.  Last night he had a blood sugar 116 at bedtime.     Patient Active Problem List   Diagnosis Date Noted  . Common bile duct stone 11/24/2016  . Abdominal pain 11/24/2016  . Pancreatitis, acute 11/24/2016  . Pulmonary nodules 11/18/2016  . Abnormal chest CT 11/14/2016  . Type 2 diabetes  mellitus with hyperlipidemia (HCC) 12/04/2015  . Sinus congestion 02/18/2015  . GERD (gastroesophageal reflux disease) 03/08/2014  . NAFLD (nonalcoholic fatty liver disease) 40/98/1191  . Elevated liver enzymes 03/08/2014  . Ascending aortic aneurysm (HCC) 12/12/2012  . INSOMNIA 09/15/2010  . Atrial fibrillation, chronic (HCC) 09/15/2010  . CAD (coronary artery disease) 07/09/2009  . AORTIC VALVE DISORDERS 05/27/2009  . OSTEOARTHRITIS, LOCALIZED 05/27/2009  . Diabetes mellitus type 2, uncontrolled, without complications (HCC) 05/24/2009  . Hyperlipidemia with target LDL less than 70 05/24/2009  . Hypokalemia 05/24/2009  . Essential hypertension 05/24/2009  . UNSPECIFIED SLEEP APNEA 05/24/2009   Outpatient Encounter Medications as of 10/12/2017  Medication Sig  . ANDROGEL PUMP 20.25 MG/ACT (1.62%) GEL Apply THREE pumps DAILY AS DIRECTED (COUPON CARD ON FILE)  . aspirin EC 81 MG tablet Take 81 mg by mouth daily.  [name] atenolol (TENORMIN) 25 MG tablet TAKE 1 TABLET DAILY  . atorvastatin (LIPITOR) 40 MG tablet TAKE 1 TABLET DAILY  . cholecalciferol (VITAMIN D) 1000 UNITS tablet Take 2,000 Units by mouth daily.   . fluticasone (FLONASE) 50 MCG/ACT nasal spray Place 2 sprays into the nose daily as needed.  [name] glipiZIDE (GLUCOTROL) 5 MG tablet TAKE 1 TABLET TWICE A DAY BEFORE A MEAL (DISCONTINUE NATEGLINIDE/STARLIX)  . JARDIANCE 25 MG TABS tablet TAKE ONE TABLET BY MOUTH  EVERY DAY  . lisinopril (PRINIVIL,ZESTRIL) 20 MG tablet TAKE 1 TABLET DAILY  . metFORMIN (GLUCOPHAGE) 1000 MG tablet TAKE 1 TABLET TWICE DAILY WITH MEALS  . pantoprazole (PROTONIX) 40 MG tablet Take 40 mg by mouth daily.  . ranitidine (ZANTAC) 150 MG tablet Take 150 mg by mouth as needed.   . temazepam (RESTORIL) 15 MG capsule TAKE 1 TABLET BY MOUTH AT BEDTIME AS NEEDED  . promethazine (PHENERGAN) 25 MG tablet TAKE 1 TABLET EVERY 4 HOURS AS NEEDED (Patient not taking: Reported on 10/12/2017)   No facility-administered encounter  medications on file as of 10/12/2017.      Review of Systems  Constitutional: Negative.   HENT: Negative.   Eyes: Negative.   Respiratory: Negative.   Cardiovascular: Negative.   Gastrointestinal: Negative.   Endocrine: Negative.   Genitourinary: Negative.   Musculoskeletal: Negative.   Skin: Negative.   Allergic/Immunologic: Negative.   Neurological: Negative.   Hematological: Negative.   Psychiatric/Behavioral: Negative.        Objective:   Physical Exam  Constitutional: He is oriented to person, place, and time. He appears well-developed and well-nourished. No distress.  Patient is pleasant and alert  HENT:  Head: Normocephalic and atraumatic.  Right Ear: External ear normal.  Left Ear: External ear normal.  Mouth/Throat: Oropharynx is clear and moist. No oropharyngeal exudate.  Nasal turbinate congestion bilaterally  Eyes: Pupils are equal, round, and reactive to light. Conjunctivae and EOM are normal. Right eye exhibits no discharge. Left eye exhibits no discharge. No scleral icterus.  Up-to-date on eye exams  Neck: Normal range of motion. Neck supple. No thyromegaly present.  Thyroid enlargement or anterior cervical adenopathy  Cardiovascular: Normal rate, regular rhythm, normal heart sounds and intact distal pulses.  No murmur heard. The heart is regular at 60/min  Pulmonary/Chest: Effort normal and breath sounds normal. No respiratory distress. He has no wheezes. He has no rales. He exhibits no tenderness.  Clear anteriorly and posteriorly and no axillary adenopathy  Abdominal: Soft. Bowel sounds are normal. He exhibits no mass. There is no tenderness. There is no rebound and no guarding.  No abdominal tenderness masses organ enlargement bruits or inguinal adenopathy  Musculoskeletal: Normal range of motion. He exhibits no edema.  Lymphadenopathy:    He has no cervical adenopathy.  Neurological: He is alert and oriented to person, place, and time. He has normal  reflexes. No cranial nerve deficit.  Skin: Skin is warm and dry. No rash noted.  Psychiatric: He has a normal mood and affect. His behavior is normal. Judgment and thought content normal.  Nursing note and vitals reviewed.  BP (!) 102/59 (BP Location: Left Arm)   Pulse 64   Temp (!) 97.5 F (36.4 C) (Oral)   Ht 5\' 10"  (1.778 m)   Wt 208 lb (94.3 kg)   BMI 29.84 kg/m         Assessment & Plan:  1. Uncontrolled type 2 diabetes mellitus without complication, without long-term current use of insulin (HCC) -Continue with current treatment pending results of lab work  2. Essential hypertension -Blood pressure is good today and he will continue with current treatment  3. Vitamin D deficiency -Continue with current treatment pending results of lab work  4. Hyperlipidemia associated with type 2 diabetes mellitus (HCC) -Continue with statin drug and as aggressive therapeutic lifestyle changes as possible  5. Testosterone deficiency -Continue with AndroGel and check PSA and testosterone level at next visit  6. Gastroesophageal reflux disease, esophagitis  presence not specified -No complaints with reflux but he will continue with his ranitidine  7. Benign prostatic hyperplasia, unspecified whether lower urinary tract symptoms present -No complaints with voiding  8. Ascending aortic aneurysm (HCC) -Follow-up with appropriate scans as determined by cardiologist  9. NAFLD (nonalcoholic fatty liver disease) -No GI complaints.  10. Type 2 diabetes mellitus with hyperlipidemia (HCC) -Tinea current treatment and as aggressive therapeutic lifestyle changes as possible which include diet and exercise pending results of lab work  11.  Allergic rhinitis -Continue to use Flonase and as needed nonsedating antihistamine   Meds ordered this encounter  Medications  . temazepam (RESTORIL) 15 MG capsule    Sig: TAKE 1 TABLET BY MOUTH AT BEDTIME AS NEEDED    Dispense:  90 capsule     Refill:  2    Generic EXB:[id number]    15MG   03/10/2016 12:56:14 PM   Patient Instructions                       Medicare Annual Wellness Visit  [facility]sel[facility] and the medical providers at [facility] strive to bring you the best medical care.  In doing so we not only want to address your current medical conditions and concerns but also to detect new conditions early and prevent illness, disease and health-related problems.    Medicare offers a yearly Wellness Visit which allows our clinical staff to assess your need for preventative services including immunizations, lifestyle education, counseling to decrease risk of preventable diseases and screening for fall risk and other medical concerns.    This visit is provided free of charge (no copay) for all Medicare recipients. The clinical pharmacists at [facility] have begun to conduct these Wellness Visits which will also include a thorough review of all your medications.    As you primary medical provider recommend that you make an appointment for your Annual Wellness Visit if you have not done so already this year.  You may set up this appointment before you leave today or you may call back ([phone]) and schedule an appointment.  Please make sure when you call that you mention that you are scheduling your Annual Wellness Visit with the clinical pharmacist so that the appointment may be made for the proper length of time.     Continue current medications. Continue good therapeutic lifestyle changes which include good diet and exercise. Fall precautions discussed with patient. If an FOBT was given today- please return it to our front desk. If you are over 44 years old - you may need Prevnar 13 or the adult Pneumonia vaccine.  **Flu shots are available--- please call and schedule a FLU-CLINIC appointment**  After your visit with [place] today you will receive a survey in the mail or online from [employer]  regarding your care with [place]. Please take a moment to fill this out. Your feedback is very important to [place] as you can help [place] better understand your patient needs as well as improve your experience and satisfaction. WE CARE ABOUT YOU!!!   Follow-up with cardiology as planned Continue to see ophthalmologist yearly Keep moisturizer on the plantar surface of both feet to keep skin from cracking open Continue to monitor blood sugars regularly Avoid sedating antihistamines as much as possible and if other medicines are needed besides Flonase for allergic rhinitis use Claritin or Allegra  Nyra Capes MD

## 2017-10-13 ENCOUNTER — Other Ambulatory Visit: Payer: Self-pay | Admitting: *Deleted

## 2017-10-13 LAB — LIPID PANEL
CHOL/HDL RATIO: 4.2 ratio (ref 0.0–5.0)
CHOLESTEROL TOTAL: 123 mg/dL (ref 100–199)
HDL: 29 mg/dL — AB (ref >39)
LDL Calculated: 60 mg/dL (ref 0–99)
TRIGLYCERIDES: 169 mg/dL — AB (ref 0–149)
VLDL Cholesterol Cal: 34 mg/dL (ref 5–40)

## 2017-10-13 LAB — HEPATIC FUNCTION PANEL
ALK PHOS: 73 IU/L (ref 39–117)
ALT: 70 IU/L — AB (ref 0–44)
AST: 51 IU/L — AB (ref 0–40)
Albumin: 4.7 g/dL (ref 3.6–4.8)
BILIRUBIN TOTAL: 0.7 mg/dL (ref 0.0–1.2)
BILIRUBIN, DIRECT: 0.2 mg/dL (ref 0.00–0.40)
Total Protein: 7.3 g/dL (ref 6.0–8.5)

## 2017-10-13 LAB — CBC WITH DIFFERENTIAL/PLATELET
BASOS ABS: 0 10*3/uL (ref 0.0–0.2)
Basos: 1 %
EOS (ABSOLUTE): 0.1 10*3/uL (ref 0.0–0.4)
EOS: 2 %
HEMATOCRIT: 44.7 % (ref 37.5–51.0)
Hemoglobin: 14.8 g/dL (ref 13.0–17.7)
IMMATURE GRANULOCYTES: 0 %
Immature Grans (Abs): 0 10*3/uL (ref 0.0–0.1)
Lymphocytes Absolute: 1.6 10*3/uL (ref 0.7–3.1)
Lymphs: 25 %
MCH: 31.9 pg (ref 26.6–33.0)
MCHC: 33.1 g/dL (ref 31.5–35.7)
MCV: 96 fL (ref 79–97)
MONOCYTES: 10 %
Monocytes Absolute: 0.6 10*3/uL (ref 0.1–0.9)
NEUTROS PCT: 62 %
Neutrophils Absolute: 4 10*3/uL (ref 1.4–7.0)
Platelets: 187 10*3/uL (ref 150–379)
RBC: 4.64 x10E6/uL (ref 4.14–5.80)
RDW: 14 % (ref 12.3–15.4)
WBC: 6.3 10*3/uL (ref 3.4–10.8)

## 2017-10-13 LAB — BMP8+EGFR
BUN/Creatinine Ratio: 16 (ref 10–24)
BUN: 16 mg/dL (ref 8–27)
CALCIUM: 9.6 mg/dL (ref 8.6–10.2)
CO2: 20 mmol/L (ref 20–29)
CREATININE: 0.97 mg/dL (ref 0.76–1.27)
Chloride: 102 mmol/L (ref 96–106)
GFR calc Af Amer: 96 mL/min/{1.73_m2} (ref >59)
GFR calc non Af Amer: 83 mL/min/{1.73_m2} (ref >59)
GLUCOSE: 158 mg/dL — AB (ref 65–99)
Potassium: 4.7 mmol/L (ref 3.5–5.2)
Sodium: 142 mmol/L (ref 134–144)

## 2017-10-13 LAB — VITAMIN D 25 HYDROXY (VIT D DEFICIENCY, FRACTURES): Vit D, 25-Hydroxy: 35.9 ng/mL (ref 30.0–100.0)

## 2017-10-13 MED ORDER — ICOSAPENT ETHYL 1 G PO CAPS: 2.0000 g | ORAL_CAPSULE | Freq: Two times a day (BID) | ORAL | 3 refills | Status: DC

## 2017-10-22 ENCOUNTER — Encounter: Payer: Self-pay | Admitting: *Deleted

## 2017-10-25 ENCOUNTER — Encounter: Payer: Self-pay | Admitting: Cardiology

## 2017-10-25 ENCOUNTER — Encounter: Payer: Self-pay | Admitting: *Deleted

## 2017-10-25 ENCOUNTER — Ambulatory Visit (INDEPENDENT_AMBULATORY_CARE_PROVIDER_SITE_OTHER): Payer: BLUE CROSS/BLUE SHIELD | Admitting: Cardiology

## 2017-10-25 VITALS — BP 122/64 | HR 62 | Ht 70.0 in | Wt 208.0 lb

## 2017-10-25 DIAGNOSIS — I712 Thoracic aortic aneurysm, without rupture: Secondary | ICD-10-CM

## 2017-10-25 DIAGNOSIS — I7121 Aneurysm of the ascending aorta, without rupture: Secondary | ICD-10-CM

## 2017-10-25 DIAGNOSIS — Z952 Presence of prosthetic heart valve: Secondary | ICD-10-CM | POA: Diagnosis not present

## 2017-10-25 DIAGNOSIS — I10 Essential (primary) hypertension: Secondary | ICD-10-CM

## 2017-10-25 DIAGNOSIS — E782 Mixed hyperlipidemia: Secondary | ICD-10-CM
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
## 2017-10-25 NOTE — Patient Instructions (Signed)

## 2017-10-28 DIAGNOSIS — S62347D Nondisplaced fracture of base of fifth metacarpal bone. left hand, subsequent encounter for fracture with routine healing: Secondary | ICD-10-CM | POA: Diagnosis not present

## 2017-10-28 DIAGNOSIS — S62318A Displaced fracture of base of other metacarpal bone, initial encounter for closed fracture: Secondary | ICD-10-CM | POA: Insufficient documentation

## 2017-11-05 ENCOUNTER — Encounter: Payer: BLUE CROSS/BLUE SHIELD | Admitting: Pharmacist Clinician (PhC)/ Clinical Pharmacy Specialist

## 2017-11-05 ENCOUNTER — Ambulatory Visit (INDEPENDENT_AMBULATORY_CARE_PROVIDER_SITE_OTHER): Payer: BLUE CROSS/BLUE SHIELD | Admitting: Pharmacist Clinician (PhC)/ Clinical Pharmacy Specialist

## 2017-11-05 ENCOUNTER — Encounter: Payer: Self-pay | Admitting: Pharmacist Clinician (PhC)/ Clinical Pharmacy Specialist

## 2017-11-05 DIAGNOSIS — E1165 Type 2 diabetes mellitus with hyperglycemia: Secondary | ICD-10-CM

## 2017-11-05 DIAGNOSIS — IMO0001 Reserved for inherently not codable concepts without codable children: Secondary | ICD-10-CM

## 2017-11-05 NOTE — Progress Notes (Signed)
Elizabeth Haff is a 62 year old male who is referred by Dr. Christell Constant for diabetes education and evaluation for starting basal insulin.  Patient does not want to start taking insulin.  I assessed his diet and exercise.  He is taking Jardiance, Metformin, and Glipizide and had pancreatitis a year ago.  A/P:  1.  Diabetes:  Patient has a diet high in sugars and carbohydrates.  He has a ham biscuit from [employer] every morning and a regular ginger ale.  For lunch he eats a hot dog with fries or onion rings and a large sweet tea, for dinner he has a grilled chicken salad with sweet tea again and in the evening regular ice cream with fruit.  I counseled patient extensively on a diabetic diet and even quizzed him to assess his baseline knowledge.  He is agreeable to stopping sweet tea and ginger ale and making healthy changes in his diet we discussed today.  Will continue current oral medications for now with patient understanding if his A1C is not at goal next visit he will have to start insulin.  Patient only walks 1/2 mile a day and it takes him 20 minutes so he will also increase to 30 minutes at a brisk pace.  Total Time spent with patient:  45 minutes  Chari Manning, PharmD, CPP, FNLA

## 2017-11-07 ENCOUNTER — Other Ambulatory Visit: Payer: Self-pay | Admitting: Family Medicine

## 2017-11-18 DIAGNOSIS — S62347D Nondisplaced fracture of base of fifth metacarpal bone. left hand, subsequent encounter for fracture with routine healing: Secondary | ICD-10-CM | POA: Diagnosis not present

## 2017-11-21 ENCOUNTER — Other Ambulatory Visit: Payer: Self-pay | Admitting: Family Medicine

## 2017-12-14 ENCOUNTER — Other Ambulatory Visit: Payer: Self-pay | Admitting: Family Medicine

## 2017-12-14 MED ORDER — EMPAGLIFLOZIN 25 MG PO TABS: 25.0000 mg | ORAL_TABLET | Freq: Every day | ORAL | 0 refills | Status: DC

## 2017-12-15 ENCOUNTER — Telehealth: Payer: Self-pay | Admitting: Family Medicine

## 2017-12-15 NOTE — Telephone Encounter (Signed)
Please review and advise.

## 2017-12-15 NOTE — Telephone Encounter (Signed)
Is the patient taking Jardiance at all now.? Reconfirm what he is currently taking?? Get a range of what his blood sugars are currently running fasting and 4 hours after eating Find out when he is going to be back in the office again If he could bring some blood sugars by for review that would be helpful

## 2017-12-15 NOTE — Telephone Encounter (Signed)
Spoke with [facility] and he states all of these new type meds are going to be expensive.  The closest meds for cheaper would be  Glimepiride, glipizide or actos.  Should we follow this with Marcelino DusterMichelle?

## 2017-12-15 NOTE — Telephone Encounter (Signed)
Please discuss with patient pharmacy what medicines similar to [place]ardiance will his insurance cover

## 2017-12-16 NOTE — Telephone Encounter (Signed)
Have patient confer with Marcelino DusterMichelle tomorrow about any other suggestions that would be cheaper other than starting on glimepiride

## 2017-12-16 NOTE — Telephone Encounter (Signed)
Pt is still on Jardiance 25 mg -[place] [facility] [place].   takes 1 a day - will take last pill on Sunday.  Fasting BS run: 120-150 After meal run : Around 180  Does not have a f/u with [facility]lee [place] Sees [place] in AUG 2019  He hasn't been documenting  Marcelino DusterMichelle is here tomorrow

## 2017-12-24 NOTE — Telephone Encounter (Signed)
The cheapest option will be glimepiride, I would start with 2mg  dose and titrate as needed to avoid hypoglycemia.

## 2017-12-27 MED ORDER — GLIMEPIRIDE 2 MG PO TABS: 2.0000 mg | ORAL_TABLET | Freq: Every day | ORAL | 1 refills | Status: DC

## 2017-12-27 NOTE — Telephone Encounter (Signed)
He is finished the [place] and changed patient to glimepiride 2 mg daily and this may have to be increased based on the blood sugars but start with 2 mg daily and check blood sugars to make sure there is no hypoglycemia.  Please bring readings by for review and a couple of weeks and or give follow-up appointment with [name] [name]

## 2017-12-27 NOTE — Telephone Encounter (Signed)
Pt aware of change and sent to pharm

## 2018-01-03 ENCOUNTER — Encounter: Payer: Self-pay | Admitting: Family Medicine

## 2018-01-09 ENCOUNTER — Other Ambulatory Visit: Payer: Self-pay | Admitting: Cardiology

## 2018-01-19 DIAGNOSIS — M65332 Trigger finger, left middle finger: Secondary | ICD-10-CM | POA: Diagnosis not present

## 2018-01-19 DIAGNOSIS — M65342 Trigger finger, left ring finger: Secondary | ICD-10-CM | POA: Insufficient documentation

## 2018-01-21 HISTORY — PX: OTHER SURGICAL HISTORY: SHX169

## 2018-01-26 ENCOUNTER — Other Ambulatory Visit: Payer: Self-pay | Admitting: Urology

## 2018-01-26 ENCOUNTER — Ambulatory Visit (HOSPITAL_COMMUNITY)
Admission: RE | Admit: 2018-01-26 | Discharge: 2018-01-26 | Disposition: A | Discharge status: Home / Self Care | Payer: BLUE CROSS/BLUE SHIELD | Source: Ambulatory Visit | Attending: Urology | Admitting: Urology

## 2018-01-26 DIAGNOSIS — N200 Calculus of kidney: Secondary | ICD-10-CM

## 2018-02-01 ENCOUNTER — Ambulatory Visit (INDEPENDENT_AMBULATORY_CARE_PROVIDER_SITE_OTHER): Payer: BLUE CROSS/BLUE SHIELD | Admitting: Urology

## 2018-02-01 DIAGNOSIS — N200 Calculus of kidney: Secondary | ICD-10-CM | POA: Diagnosis not present

## 2018-02-06 ENCOUNTER — Other Ambulatory Visit: Payer: Self-pay | Admitting: Family Medicine

## 2018-02-24 ENCOUNTER — Encounter: Payer: Self-pay | Admitting: Family Medicine

## 2018-02-24 ENCOUNTER — Ambulatory Visit (INDEPENDENT_AMBULATORY_CARE_PROVIDER_SITE_OTHER): Payer: BLUE CROSS/BLUE SHIELD

## 2018-02-24 ENCOUNTER — Ambulatory Visit (INDEPENDENT_AMBULATORY_CARE_PROVIDER_SITE_OTHER): Payer: BLUE CROSS/BLUE SHIELD | Admitting: Family Medicine

## 2018-02-24 VITALS — BP 123/59 | HR 52 | Temp 97.6°F | Ht 70.0 in | Wt 207.0 lb

## 2018-02-24 DIAGNOSIS — E1169 Type 2 diabetes mellitus with other specified complication: Secondary | ICD-10-CM

## 2018-02-24 DIAGNOSIS — E785 Hyperlipidemia, unspecified: Secondary | ICD-10-CM

## 2018-02-24 DIAGNOSIS — Z0000 Encounter for general adult medical examination without abnormal findings: Secondary | ICD-10-CM | POA: Diagnosis not present

## 2018-02-24 DIAGNOSIS — I482 Chronic atrial fibrillation, unspecified: Secondary | ICD-10-CM

## 2018-02-24 DIAGNOSIS — N400 Enlarged prostate without lower urinary tract symptoms: Secondary | ICD-10-CM

## 2018-02-24 DIAGNOSIS — Z1211 Encounter for screening for malignant neoplasm of colon: Secondary | ICD-10-CM

## 2018-02-24 DIAGNOSIS — K760 Fatty (change of) liver, not elsewhere classified: Secondary | ICD-10-CM

## 2018-02-24 DIAGNOSIS — I10 Essential (primary) hypertension: Secondary | ICD-10-CM | POA: Diagnosis not present

## 2018-02-24 DIAGNOSIS — K219 Gastro-esophageal reflux disease without esophagitis: Secondary | ICD-10-CM

## 2018-02-24 DIAGNOSIS — E559 Vitamin D deficiency, unspecified: Secondary | ICD-10-CM

## 2018-02-24 DIAGNOSIS — E349 Endocrine disorder, unspecified: Secondary | ICD-10-CM

## 2018-02-24 DIAGNOSIS — E1165 Type 2 diabetes mellitus with hyperglycemia: Secondary | ICD-10-CM

## 2018-02-24 DIAGNOSIS — I7121 Aneurysm of the ascending aorta, without rupture: Secondary | ICD-10-CM

## 2018-02-24 DIAGNOSIS — IMO0001 Reserved for inherently not codable concepts without codable children: Secondary | ICD-10-CM

## 2018-02-24 DIAGNOSIS — J301 Allergic rhinitis due to pollen: Secondary | ICD-10-CM

## 2018-02-24 DIAGNOSIS — I712 Thoracic aortic aneurysm, without rupture: Secondary | ICD-10-CM

## 2018-02-24 LAB — BAYER DCA HB A1C WAIVED: HB A1C (BAYER DCA - WAIVED): 8.4 % — ABNORMAL HIGH (ref <7.0)
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
## 2018-02-24 NOTE — Patient Instructions (Addendum)
Continue current medications. Continue good therapeutic lifestyle changes which include good diet and exercise. Fall precautions discussed with patient. If an FOBT was given today- please return it to our front desk. If you are over 63 years old - you may need Prevnar 13 or the adult Pneumonia vaccine.  **Flu shots are available--- please call and schedule a FLU-CLINIC appointment**  After your visit with us today you will receive a survey in the mail or online from [employer] regarding your care with us. Please take a moment to fill this out. Your feedback is very important to us as you can help us better understand your patient needs as well as improve your experience and satisfaction. WE CARE ABOUT YOU!!!   Continue to follow-up with cardiology--- get CT scans at appropriate times to follow-up on a sending aortic aneurysm. Continue to follow-up with urology because of left renal stone Watch diet more closely, watch caffeine and take, watch beverages that are sugar containing.  Make changes in diet as recommended by clinical pharmacist recently. We will call with the results of the A1c as soon as it becomes available and may consider at that time adding insulin to your diabetic regimen. For the rest of the summer drink plenty of fluids and stay well-hydrated Use Claritin and add Flonase to this for the allergic rhinitis.  Sedating antihistamines on a regular basis can increase the risk of dementia.

## 2018-02-25 LAB — BMP8+EGFR
BUN / CREAT RATIO: 18 (ref 10–24)
BUN: 16 mg/dL (ref 8–27)
CO2: 20 mmol/L (ref 20–29)
Calcium: 10 mg/dL (ref 8.6–10.2)
Chloride: 102 mmol/L (ref 96–106)
Creatinine, Ser: 0.89 mg/dL (ref 0.76–1.27)
GFR calc non Af Amer: 92 mL/min/{1.73_m2} (ref >59)
GFR, EST AFRICAN AMERICAN: 106 mL/min/{1.73_m2} (ref >59)
Glucose: 161 mg/dL — ABNORMAL HIGH (ref 65–99)
Potassium: 4.6 mmol/L (ref 3.5–5.2)
SODIUM: 142 mmol/L (ref 134–144)

## 2018-02-25 LAB — HEPATIC FUNCTION PANEL
ALT: 71 IU/L — ABNORMAL HIGH (ref 0–44)
AST: 53 IU/L — ABNORMAL HIGH (ref 0–40)
Albumin: 4.8 g/dL (ref 3.6–4.8)
Alkaline Phosphatase: 71 IU/L (ref 39–117)
Bilirubin Total: 0.5 mg/dL (ref 0.0–1.2)
Bilirubin, Direct: 0.14 mg/dL (ref 0.00–0.40)
TOTAL PROTEIN: 7.3 g/dL (ref 6.0–8.5)

## 2018-02-25 LAB — LIPID PANEL
CHOL/HDL RATIO: 3.9 ratio (ref 0.0–5.0)
Cholesterol, Total: 113 mg/dL (ref 100–199)
HDL: 29 mg/dL — ABNORMAL LOW (ref >39)
LDL Calculated: 49 mg/dL (ref 0–99)
Triglycerides: 175 mg/dL — ABNORMAL HIGH (ref 0–149)
VLDL Cholesterol Cal: 35 mg/dL (ref 5–40)

## 2018-02-25 LAB — CBC WITH DIFFERENTIAL/PLATELET
Basophils Absolute: 0 10*3/uL (ref 0.0–0.2)
Basos: 1 %
EOS (ABSOLUTE): 0.1 10*3/uL (ref 0.0–0.4)
Eos: 2 %
HEMATOCRIT: 43.5 % (ref 37.5–51.0)
Hemoglobin: 14.3 g/dL (ref 13.0–17.7)
Immature Grans (Abs): 0 10*3/uL (ref 0.0–0.1)
Immature Granulocytes: 0 %
LYMPHS ABS: 1.9 10*3/uL (ref 0.7–3.1)
Lymphs: 29 %
MCH: 32.1 pg (ref 26.6–33.0)
MCHC: 32.9 g/dL (ref 31.5–35.7)
MCV: 98 fL — AB (ref 79–97)
MONOS ABS: 0.5 10*3/uL (ref 0.1–0.9)
Monocytes: 8 %
NEUTROS ABS: 3.9 10*3/uL (ref 1.4–7.0)
Neutrophils: 60 %
Platelets: 198 10*3/uL (ref 150–450)
RBC: 4.45 x10E6/uL (ref 4.14–5.80)
RDW: 14.2 % (ref 12.3–15.4)
WBC: 6.5 10*3/uL (ref 3.4–10.8)

## 2018-02-25 LAB — VITAMIN D 25 HYDROXY (VIT D DEFICIENCY, FRACTURES): Vit D, 25-Hydroxy: 41 ng/mL (ref 30.0–100.0)

## 2018-02-26 ENCOUNTER — Other Ambulatory Visit: Payer: Self-pay | Admitting: Family Medicine

## 2018-02-26 LAB — FECAL OCCULT BLOOD, IMMUNOCHEMICAL: Fecal Occult Bld: NEGATIVE

## 2018-03-07 ENCOUNTER — Emergency Department (HOSPITAL_COMMUNITY): Payer: BLUE CROSS/BLUE SHIELD | Admitting: Anesthesiology

## 2018-03-07 ENCOUNTER — Emergency Department (HOSPITAL_COMMUNITY)
Admission: EM | Admit: 2018-03-07 | Discharge: 2018-03-07 | Disposition: A | Discharge status: Home / Self Care | Payer: BLUE CROSS/BLUE SHIELD | Attending: Emergency Medicine | Admitting: Emergency Medicine

## 2018-03-07 ENCOUNTER — Other Ambulatory Visit: Payer: Self-pay

## 2018-03-07 ENCOUNTER — Emergency Department (HOSPITAL_COMMUNITY): Payer: BLUE CROSS/BLUE SHIELD

## 2018-03-07 ENCOUNTER — Encounter (HOSPITAL_COMMUNITY): Payer: Self-pay | Admitting: Emergency Medicine

## 2018-03-07 ENCOUNTER — Encounter (HOSPITAL_COMMUNITY)
Admission: EM | Disposition: A | Discharge status: Home / Self Care | Payer: Self-pay | Source: Home / Self Care | Attending: Emergency Medicine

## 2018-03-07 DIAGNOSIS — Z87891 Personal history of nicotine dependence: Secondary | ICD-10-CM | POA: Diagnosis not present

## 2018-03-07 DIAGNOSIS — N202 Calculus of kidney with calculus of ureter: Secondary | ICD-10-CM | POA: Diagnosis not present

## 2018-03-07 DIAGNOSIS — Z79899 Other long term (current) drug therapy: Secondary | ICD-10-CM | POA: Diagnosis not present

## 2018-03-07 DIAGNOSIS — Z8551 Personal history of malignant neoplasm of bladder: Secondary | ICD-10-CM | POA: Diagnosis not present

## 2018-03-07 DIAGNOSIS — E785 Hyperlipidemia, unspecified: Secondary | ICD-10-CM | POA: Insufficient documentation

## 2018-03-07 DIAGNOSIS — Z7982 Long term (current) use of aspirin: Secondary | ICD-10-CM | POA: Diagnosis not present

## 2018-03-07 DIAGNOSIS — N23 Unspecified renal colic: Secondary | ICD-10-CM | POA: Diagnosis not present

## 2018-03-07 DIAGNOSIS — E1151 Type 2 diabetes mellitus with diabetic peripheral angiopathy without gangrene: Secondary | ICD-10-CM | POA: Diagnosis not present

## 2018-03-07 DIAGNOSIS — G4733 Obstructive sleep apnea (adult) (pediatric): Secondary | ICD-10-CM | POA: Insufficient documentation

## 2018-03-07 DIAGNOSIS — I10 Essential (primary) hypertension: Secondary | ICD-10-CM | POA: Insufficient documentation

## 2018-03-07 DIAGNOSIS — Z9989 Dependence on other enabling machines and devices: Secondary | ICD-10-CM | POA: Diagnosis not present

## 2018-03-07 DIAGNOSIS — Z951 Presence of aortocoronary bypass graft: Secondary | ICD-10-CM | POA: Insufficient documentation

## 2018-03-07 DIAGNOSIS — I2510 Atherosclerotic heart disease of native coronary artery without angina pectoris: Secondary | ICD-10-CM | POA: Insufficient documentation

## 2018-03-07 DIAGNOSIS — Z7984 Long term (current) use of oral hypoglycemic drugs: Secondary | ICD-10-CM | POA: Diagnosis not present

## 2018-03-07 DIAGNOSIS — Z952 Presence of prosthetic heart valve: Secondary | ICD-10-CM | POA: Diagnosis not present

## 2018-03-07 DIAGNOSIS — N201 Calculus of ureter: Secondary | ICD-10-CM

## 2018-03-07 DIAGNOSIS — N132 Hydronephrosis with renal and ureteral calculous obstruction: Secondary | ICD-10-CM | POA: Diagnosis not present

## 2018-03-07 DIAGNOSIS — I712 Thoracic aortic aneurysm, without rupture: Secondary | ICD-10-CM | POA: Diagnosis not present

## 2018-03-07 DIAGNOSIS — M549 Dorsalgia, unspecified: Secondary | ICD-10-CM | POA: Diagnosis not present

## 2018-03-07 DIAGNOSIS — E559 Vitamin D deficiency, unspecified: Secondary | ICD-10-CM | POA: Diagnosis not present

## 2018-03-07 DIAGNOSIS — R972 Elevated prostate specific antigen [PSA]: Secondary | ICD-10-CM | POA: Diagnosis not present

## 2018-03-07 DIAGNOSIS — R1032 Left lower quadrant pain: Secondary | ICD-10-CM | POA: Diagnosis not present

## 2018-03-07 DIAGNOSIS — N289 Disorder of kidney and ureter, unspecified: Secondary | ICD-10-CM | POA: Diagnosis not present

## 2018-03-07 HISTORY — PX: CYSTOSCOPY WITH RETROGRADE PYELOGRAM, URETEROSCOPY AND STENT PLACEMENT: SHX5789

## 2018-03-07 LAB — BASIC METABOLIC PANEL
Anion gap: 12 (ref 5–15)
BUN: 29 mg/dL — AB (ref 8–23)
CHLORIDE: 104 mmol/L (ref 98–111)
CO2: 22 mmol/L (ref 22–32)
Calcium: 9.3 mg/dL (ref 8.9–10.3)
Creatinine, Ser: 1.59 mg/dL — ABNORMAL HIGH (ref 0.61–1.24)
GFR calc Af Amer: 52 mL/min — ABNORMAL LOW (ref >60)
GFR calc non Af Amer: 45 mL/min — ABNORMAL LOW (ref >60)
GLUCOSE: 245 mg/dL — AB (ref 70–99)
POTASSIUM: 4.6 mmol/L (ref 3.5–5.1)
Sodium: 138 mmol/L (ref 135–145)

## 2018-03-07 LAB — CBC
HEMATOCRIT: 41.8 % (ref 39.0–52.0)
HEMOGLOBIN: 14.3 g/dL (ref 13.0–17.0)
MCH: 33.3 pg (ref 26.0–34.0)
MCHC: 34.2 g/dL (ref 30.0–36.0)
MCV: 97.2 fL (ref 78.0–100.0)
Platelets: 197 10*3/uL (ref 150–400)
RBC: 4.3 MIL/uL (ref 4.22–5.81)
RDW: 13.6 % (ref 11.5–15.5)
WBC: 11.1 10*3/uL — ABNORMAL HIGH (ref 4.0–10.5)

## 2018-03-07 LAB — GLUCOSE, CAPILLARY: Glucose-Capillary: 185 mg/dL — ABNORMAL HIGH (ref 70–99)

## 2018-03-07 SURGERY — CYSTOURETEROSCOPY, WITH RETROGRADE PYELOGRAM AND STENT INSERTION
Anesthesia: General | Site: Ureter | Laterality: Left

## 2018-03-07 MED ORDER — ONDANSETRON HCL 4 MG/2ML IJ SOLN
4.0000 mg | Freq: Once | INTRAMUSCULAR | Status: AC
  Administered 2018-03-07: 4 mg via INTRAVENOUS
  Filled 2018-03-07: qty 2

## 2018-03-07 MED ORDER — IOHEXOL 300 MG/ML  SOLN
INTRAMUSCULAR | Status: DC | PRN
  Administered 2018-03-07: 10 mL

## 2018-03-07 MED ORDER — DEXAMETHASONE SODIUM PHOSPHATE 10 MG/ML IJ SOLN
INTRAMUSCULAR | Status: DC | PRN
  Administered 2018-03-07: 10 mg via INTRAVENOUS

## 2018-03-07 MED ORDER — LIDOCAINE 2% (20 MG/ML) 5 ML SYRINGE
INTRAMUSCULAR | Status: DC | PRN
  Administered 2018-03-07: 60 mg via INTRAVENOUS

## 2018-03-07 MED ORDER — FENTANYL CITRATE (PF) 100 MCG/2ML IJ SOLN
INTRAMUSCULAR | Status: DC | PRN
  Administered 2018-03-07: 50 ug via INTRAVENOUS

## 2018-03-07 MED ORDER — PROPOFOL 10 MG/ML IV BOLUS
INTRAVENOUS | Status: DC | PRN
  Administered 2018-03-07: 150 mg via INTRAVENOUS

## 2018-03-07 MED ORDER — LACTATED RINGERS IV SOLN
INTRAVENOUS | Status: DC | PRN
  Administered 2018-03-07: 15:00:00 via INTRAVENOUS

## 2018-03-07 MED ORDER — SODIUM CHLORIDE 0.9 % IV SOLN
INTRAVENOUS | Status: DC
  Administered 2018-03-07: 1000 mL via INTRAVENOUS

## 2018-03-07 MED ORDER — OXYCODONE-ACETAMINOPHEN 5-325 MG PO TABS: 1.0000 | ORAL_TABLET | Freq: Four times a day (QID) | ORAL | 0 refills | Status: DC | PRN

## 2018-03-07 MED ORDER — SODIUM CHLORIDE 0.9 % IV BOLUS
500.0000 mL | Freq: Once | INTRAVENOUS | Status: AC
  Administered 2018-03-07: 500 mL via INTRAVENOUS

## 2018-03-07 MED ORDER — CEFAZOLIN SODIUM-DEXTROSE 2-4 GM/100ML-% IV SOLN
INTRAVENOUS | Status: AC
  Filled 2018-03-07: qty 100

## 2018-03-07 MED ORDER — PHENYLEPHRINE 40 MCG/ML (10ML) SYRINGE FOR IV PUSH (FOR BLOOD PRESSURE SUPPORT)
PREFILLED_SYRINGE | INTRAVENOUS | Status: DC | PRN
  Administered 2018-03-07 (×5): 80 ug via INTRAVENOUS

## 2018-03-07 MED ORDER — SENNOSIDES-DOCUSATE SODIUM 8.6-50 MG PO TABS: 1.0000 | ORAL_TABLET | Freq: Two times a day (BID) | ORAL | 0 refills | Status: DC

## 2018-03-07 MED ORDER — PROPOFOL 10 MG/ML IV BOLUS
INTRAVENOUS | Status: AC
  Filled 2018-03-07: qty 20

## 2018-03-07 MED ORDER — CEFAZOLIN SODIUM-DEXTROSE 2-3 GM-%(50ML) IV SOLR
INTRAVENOUS | Status: DC | PRN
  Administered 2018-03-07: 2 g via INTRAVENOUS

## 2018-03-07 MED ORDER — HYDROMORPHONE HCL 1 MG/ML IJ SOLN
1.0000 mg | Freq: Once | INTRAMUSCULAR | Status: AC
  Administered 2018-03-07: 1 mg via INTRAVENOUS
  Filled 2018-03-07: qty 1

## 2018-03-07 MED ORDER — MIDAZOLAM HCL 2 MG/2ML IJ SOLN
INTRAMUSCULAR | Status: AC
  Filled 2018-03-07: qty 2

## 2018-03-07 MED ORDER — MIDAZOLAM HCL 5 MG/5ML IJ SOLN
INTRAMUSCULAR | Status: DC | PRN
  Administered 2018-03-07: 2 mg via INTRAVENOUS

## 2018-03-07 MED ORDER — SODIUM CHLORIDE 0.9 % IR SOLN
Status: DC | PRN
  Administered 2018-03-07: 3000 mL

## 2018-03-07 MED ORDER — EPHEDRINE 5 MG/ML INJ
INTRAVENOUS | Status: AC
  Filled 2018-03-07: qty 10

## 2018-03-07 MED ORDER — CEFAZOLIN (ANCEF) 1 G IV SOLR: 2.0000 g | INTRAVENOUS | Status: DC

## 2018-03-07 MED ORDER — PHENYLEPHRINE 40 MCG/ML (10ML) SYRINGE FOR IV PUSH (FOR BLOOD PRESSURE SUPPORT)
PREFILLED_SYRINGE | INTRAVENOUS | Status: AC
  Filled 2018-03-07: qty 20

## 2018-03-07 MED ORDER — 0.9 % SODIUM CHLORIDE (POUR BTL) OPTIME
TOPICAL | Status: DC | PRN
  Administered 2018-03-07: 1000 mL

## 2018-03-07 MED ORDER — ONDANSETRON HCL 4 MG/2ML IJ SOLN
INTRAMUSCULAR | Status: DC | PRN
  Administered 2018-03-07: 4 mg via INTRAVENOUS

## 2018-03-07 MED ORDER — FENTANYL CITRATE (PF) 100 MCG/2ML IJ SOLN
INTRAMUSCULAR | Status: AC
  Filled 2018-03-07: qty 2

## 2018-03-07 SURGICAL SUPPLY — 24 items
BAG URO CATCHER STRL LF (MISCELLANEOUS) ×2 IMPLANT
BASKET LASER NITINOL 1.9FR (BASKET) IMPLANT
BSKT STON RTRVL 120 1.9FR (BASKET)
CATH INTERMIT  6FR 70CM (CATHETERS) ×2 IMPLANT
CLOTH BEACON ORANGE TIMEOUT ST (SAFETY) ×2 IMPLANT
EXTRACTOR STONE 1.7FRX115CM (UROLOGICAL SUPPLIES) IMPLANT
FIBER LASER FLEXIVA 1000 (UROLOGICAL SUPPLIES) IMPLANT
FIBER LASER FLEXIVA 365 (UROLOGICAL SUPPLIES) IMPLANT
FIBER LASER FLEXIVA 550 (UROLOGICAL SUPPLIES) IMPLANT
FIBER LASER TRAC TIP (UROLOGICAL SUPPLIES) IMPLANT
GLOVE BIOGEL M STRL SZ7.5 (GLOVE) ×2 IMPLANT
GOWN STRL REUS W/TWL LRG LVL3 (GOWN DISPOSABLE) ×4 IMPLANT
GUIDEWIRE ANG ZIPWIRE 038X150 (WIRE) ×2 IMPLANT
GUIDEWIRE STR DUAL SENSOR (WIRE) ×2 IMPLANT
IV NS 1000ML (IV SOLUTION) ×2
IV NS 1000ML BAXH (IV SOLUTION) ×1 IMPLANT
MANIFOLD NEPTUNE II (INSTRUMENTS) ×2 IMPLANT
PACK CYSTO (CUSTOM PROCEDURE TRAY) ×2 IMPLANT
SHEATH URETERAL 12FRX28CM (UROLOGICAL SUPPLIES) IMPLANT
SHEATH URETERAL 12FRX35CM (MISCELLANEOUS) IMPLANT
STENT POLARIS 5FRX26 (STENTS) ×1 IMPLANT
SYR CONTROL 10ML LL (SYRINGE) ×2 IMPLANT
TUBE FEEDING 8FR 16IN STR KANG (MISCELLANEOUS) ×2 IMPLANT
TUBING CONNECTING 10 (TUBING) ×2 IMPLANT

## 2018-03-07 NOTE — ED Notes (Signed)
Pt has went to restroom and attempted to urinate with no success.

## 2018-03-07 NOTE — H&P (Signed)
[name] [name] is an 62 y.o. male.    Chief Complaint: Left Ureteral Stone  HPI:   1 - Left Ureteral Stone - Left 58m prox ureteral stone with mild hydro by ER CT 03/2018. Stone is solitary, 800HU, SSD 13cm, 127m and easiliy seen on scout images.  H/o recurrent stones and follows [name], he lives in [place]  Cr 1.6 up from <1, Normal CBC.   PMH sig for tissue aortic valve replacement (no anticoagulation, follows Dr. BrHarl Bowien [place] limiting), DM2 (A1c 8s), lap chole, othro surgery. NO CVA / ischemic heart disease. He is retired reTheatre stage manageror [employer] Today "[name] [name] seen for left ureteral stone. He has been having trouble maintaining hydration x 24 hours due to colic. No fevers. Last meal 8pm yesterday.   Past Medical History:  Diagnosis Date  . Aortic stenosis 8/09   Bicuspid aortic valve  . Arthritis 6/10   hand   . Atrial fibrillation (HCSelma   post op only  . Bile duct calculus with acute cholecystitis   . CAD (coronary artery disease) 8/09  . Calculus of common bile duct with acute pancreatitis 11/2016  . CTS (carpal tunnel syndrome)    bilateral   . Diastolic dysfunction 02/04/34. Elevated LFTs   . Fatty liver disease, nonalcoholic   . Hyperlipidemia   . Hypertension   . Kidney stones   . Nasal congestion   . NIDDM (non-insulin dependent diabetes mellitus)    x15 yrs  . OSA on CPAP 2006  . Paresthesias in left hand 02/19/2004  . Paresthesias in right hand 02/19/2004  . Sinusitis   . Vitamin D deficiency 06/13/09    Past Surgical History:  Procedure Laterality Date  . bladder cancer - operation - 01/21/18 - Dr [name] [name]  . CARPAL TUNNEL RELEASE     bilateral   . CHOLECYSTECTOMY  11/2016  . CORONARY ANGIOPLASTY WITH STENT PLACEMENT     to rt coronary atery ([name] [name]-cardiologist)  . ERCP N/A 11/24/2016   Procedure: ENDOSCOPIC RETROGRADE CHOLANGIOPANCREATOGRAPHY (ERCP);  Surgeon: [name] [name];  [facility];  Service: Endoscopy;  Laterality: N/A;  . REMOVAL OF STONES N/A 11/24/2016   Procedure: REMOVAL OF STONES;  Surgeon: [name] [name];  Location[facility];  Service: Endoscopy;  Laterality: N/A;  . SPHINCTEROTOMY N/A 11/24/2016   Procedure: SPHINCTEROTOMY;  Surgeon: [name] [name];  [facility];  Service: Endoscopy;  Laterality: N/A;  . TISSUE AORTIC VALVE REPLACEMENT     2012    Family History  Problem Relation Age of Onset  . Hearing loss Mother   . Alzheimer's disease Mother   . Hearing loss Father   . Diabetes Father   . Diabetes Unknown        Family History    Social History:  reports that he quit smoking about 15 years ago. His smoking use included cigarettes. He started smoking about 43 years ago. He has a 37.50 pack-year smoking history. He has never used smokeless tobacco. He reports that he does not drink alcohol or use drugs.  Allergies:  Allergies  Allergen Reactions  . Glyxambi [Empagliflozin-Linagliptin] Diarrhea and Itching    He was able to take Jardiance alone with no side effects so it was the Tradjenta part that most likely gave him the reaction  . Hctz [Hydrochlorothiazide]     Nausea and headache   .  Sulfa Antibiotics Nausea And Vomiting  . Sulfonamide Derivatives      (Not in a hospital admission)  Results for orders placed or performed during the hospital encounter of 03/07/18 (from the past 48 hour(s))  Basic metabolic panel     Status: Abnormal   Collection Time: 03/07/18  9:56 AM  Result Value Ref Range   Sodium 138 135 - 145 mmol/L   Potassium 4.6 3.5 - 5.1 mmol/L   Chloride 104 98 - 111 mmol/L   CO2 22 22 - 32 mmol/L   Glucose, Bld 245 (H) 70 - 99 mg/dL   BUN 29 (H) 8 - 23 mg/dL   Creatinine, Ser 1.59 (H) 0.61 - 1.24 mg/dL   Calcium 9.3 8.9 - 10.3 mg/dL   GFR calc non Af Amer 45 (L) >60 mL/min   GFR calc Af Amer 52 (L) >60 mL/min    Comment: (NOTE) The eGFR has been calculated using the CKD EPI  equation. This calculation has not been validated in all clinical situations. eGFR's persistently <60 mL/min signify possible Chronic Kidney Disease.    Anion gap 12 5 - 15    Comment: Performed at [facility], [address] [place], [place] [address]  CBC     Status: Abnormal   Collection Time: 03/07/18  9:56 AM  Result Value Ref Range   WBC 11.1 (H) 4.0 - 10.5 K/uL   RBC 4.30 4.22 - 5.81 MIL/uL   Hemoglobin 14.3 13.0 - 17.0 g/dL   HCT 41.8 39.0 - 52.0 %   MCV 97.2 78.0 - 100.0 fL   MCH 33.3 26.0 - 34.0 pg   MCHC 34.2 30.0 - 36.0 g/dL   RDW 13.6 11.5 - 15.5 %   Platelets 197 150 - 400 K/uL    Comment: Performed at [facility], [address] [place], [place] [address]   Ct Renal Stone Study  Result Date: 03/07/2018 CLINICAL DATA:  Patient with left lower quadrant and back pain. EXAM: CT ABDOMEN AND PELVIS WITHOUT CONTRAST TECHNIQUE: Multidetector CT imaging of the abdomen and pelvis was performed following the standard protocol without IV contrast. COMPARISON:  CT abdomen pelvis 11/25/2016 FINDINGS: Lower chest: Normal heart size. Coronary arterial vascular calcifications. Dependent atelectasis within the bilateral lower lobes. Hepatobiliary: Liver is diffusely low in attenuation compatible with steatosis. Patient status post cholecystectomy. No intrahepatic or extrahepatic biliary ductal dilatation. Pancreas: Unremarkable Spleen: Unremarkable Adrenals/Urinary Tract: Adrenal glands are normal. There is a 1.2 cm obstructing stone within the proximal left ureter (image 50; series 2) resulting in mild to moderate left hydronephrosis. Multiple additional 2-4 mm stones are demonstrated within the kidneys bilaterally. Urinary bladder is unremarkable. Stomach/Bowel: Sigmoid colonic diverticulosis. No CT evidence for acute diverticulitis. Normal appendix. No evidence for small bowel obstruction. No free fluid or free intraperitoneal air. Normal morphology of the stomach. Vascular/Lymphatic: Normal  caliber abdominal aorta. Peripheral calcified atherosclerotic plaque. No retroperitoneal lymphadenopathy. Reproductive: Prostate is unremarkable. Other: Small bilateral fat containing inguinal hernias, left-greater-than-right. Musculoskeletal: Serpiginous sclerosis left femoral head may represent AVN. Lumbar spine degenerative changes. No aggressive or acute appearing osseous lesions. IMPRESSION: 1. There is a 1.2 cm obstructing stone within the proximal left ureter. 2. Multiple additional bilateral renal stones. 3. Hepatic steatosis. 4. Findings suggestive of AVN left femoral head. Electronically Signed   By: Lovey Newcomer M.D.   On: 03/07/2018 11:09    Review of Systems  Constitutional: Negative.  Negative for chills and fever.  HENT: Negative.   Eyes: Negative.  Respiratory: Negative.   Cardiovascular: Negative.   Gastrointestinal: Positive for nausea. Negative for heartburn.  Genitourinary: Positive for flank pain. Negative for hematuria.  Skin: Negative.   Neurological: Negative.   Endo/Heme/Allergies: Negative.   Psychiatric/Behavioral: Negative.     Blood pressure 127/79, pulse 64, temperature 98.1 F (36.7 C), temperature source Oral, resp. rate 12, height '5\' 10"'$  (1.778 m), weight 93.9 kg, SpO2 99 %. Physical Exam  Constitutional: He appears well-developed.  Very pleasant  HENT:  Head: Normocephalic.  Eyes: Pupils are equal, round, and reactive to light.  Cardiovascular: Normal rate.  Sternotomy scar  Respiratory: Effort normal.  GI: Soft.  Moderate left CVAT  Genitourinary: Penis normal.  Musculoskeletal: Normal range of motion.  Neurological: He is alert.  Skin: Skin is warm.  Psychiatric: He has a normal mood and affect.     Assessment/Plan    1 - Left Ureteral Stone -  His colic is refracotry and GFR becoming compromised. Discussed managmetn options and he opts for left JJ stent today, then  Elective shockwave lithotripsy. I agree. Risks, benefits, alternatives,  expected peri-op course for both stent and SWL discussed.   [place] home after stenting today.   [name] Frock, MD 03/07/2018, 2:08 PM

## 2018-03-07 NOTE — ED Triage Notes (Signed)
Pt reports left lower back pain radiating to left groin since Saturday. Pt reports minimal output with urination. Pt also reports intermittent emesis and chills. nad noted.   Pt reports has known kidney stone in left kidney and followed up with urologist x1 month ago.

## 2018-03-07 NOTE — ED Notes (Signed)
Dr Berneice Heinrich paged to let him know pt has arrived.

## 2018-03-07 NOTE — ED Provider Notes (Signed)
Patient arrives as transfer from any pain for left ureteral stone with anticipated stenting by Dr. Berneice Heinrich.  Manny notified of patient arrival.   Arby Barrette, MD 03/17/18 1005

## 2018-03-07 NOTE — ED Notes (Signed)
Report given to [facility]. 

## 2018-03-07 NOTE — Op Note (Signed)
NAME: SHRAGA, KOWAL MEDICAL RECORD IW:[id number] ACCOUNT [id number] DATE OF BIRTH:07-Feb-1956 FACILITY: [facility] LOCATION: [facility] PHYSICIAN:Chrisanna Mishra Berneice Heinrich, MD  OPERATIVE REPORT  DATE OF PROCEDURE:  03/07/2018  PREOPERATIVE DIAGNOSIS:  Left ureteral stone with hydronephrosis and refractory colic.  PROCEDURE: 1.  Cystoscopy, left retrograde pyelogram, interpretation 2.  Left ureteral stent placement, 5 x 26 Polaris; no tether.  ESTIMATED BLOOD LOSS:  Nil.  COMPLICATIONS:  None.  SPECIMENS:  None.  FINDINGS: 1.  Left mild hydronephrosis to a filling defect in the proximal ureter consistent with known stone. 2.  Distal placement of left ureteral stent, proximal renal pelvis, distally in bladder.  INDICATIONS:  The patient is a very pleasant 62 year old gentleman with history of recurrent nephrolithiasis.  He has had a known left lower pole renal stone for quite some time.  This is stable for a number of years.  He unfortunately developed acute  left colic, approximately 48 hours ago.  He was seen in the independent emergency room this morning and noted to have a left proximal ureteral stone approximately 13 mm.  He was unable to maintain hydration given his colic.  Options were discussed  including recommended path of stenting followed by definitive stone management in the elective setting and he wished to proceed with stenting today.  Informed consent was obtained and placed in medical record.  DESCRIPTION OF PROCEDURE:  The patient being Robertjohn Bolanos verified, procedure being left ureteral stent placement was confirmed.  Procedure timeout was performed.  Antibiotics administered.  General anesthesia induced.  The patient was placed into a low  lithotomy position and sterile field was created by prepping and draping base of the penis, perineum and proximal thighs using iodine.  Cystourethroscopy was performed with a 20 French rigid cystoscope with offset lens.  Inspection of anterior and   posterior urethra were unremarkable.  Inspection of bladder revealed no diverticula, calcifications or lesions.  There were some stone crystals noted throughout the bladder.  The left ureteral orifice was cannulated with a 6 [place] renal catheter and  left retrograde pyelogram was obtained.  Left retrograde pyelogram revealed a single left ureter, single system left kidney.  There was a filling defect in the proximal ureter consistent with known stone.  There was mild hydronephrosis above this.  A 0.038 ZIPwire was advanced to the level of  the upper pole of which a new 5 x 26 Polaris-type stent was placed using cystoscopic and fluoroscopic guidance.  Good proximal and distal points were noted.  Bladder was entered with the cystoscope.  Procedure then terminated.    The patient tolerated the procedure well.  No immediate apparent complications.  The patient was taken to Post Anesthesia Care Unit in stable condition.  AN/NUANCE  D:03/07/2018 T:03/07/2018 JOB:[id number]

## 2018-03-07 NOTE — ED Provider Notes (Addendum)
[facility] EMERGENCY DEPARTMENT Provider Note   CSN: [id number] Arrival date & time: 03/07/18  0901     History   Chief Complaint Chief Complaint  Patient presents with  . Groin Pain    HPI [name] [name] is a 62 y.o. male.  Patient with onset of left flank pain on Saturday.  For the last 24 hours is not been able to eat or drink anything nausea and vomiting chills but no fever.  Feeling of needing to go to the bathroom but not peeing much at all in the last 24 hours.  The pain in his left flank area does come around to the left lower quadrant.  Patient has a history of kidney stones.  Patient has a known large stone identified in the past and his kidney area.     Past Medical History:  Diagnosis Date  . Aortic stenosis 8/09   Bicuspid aortic valve  . Arthritis 6/10   hand   . Atrial fibrillation (HCC)    post op only  . Bile duct calculus with acute cholecystitis   . CAD (coronary artery disease) 8/09  . Calculus of common bile duct with acute pancreatitis 11/2016  . CTS (carpal tunnel syndrome)    bilateral   . Diastolic dysfunction 8/09  . Elevated LFTs   . Fatty liver disease, nonalcoholic   . Hyperlipidemia   . Hypertension   . Kidney stones   . Nasal congestion   . NIDDM (non-insulin dependent diabetes mellitus)    x15 yrs  . OSA on CPAP 2006  . Paresthesias in left hand 02/19/2004  . Paresthesias in right hand 02/19/2004  . Sinusitis   . Vitamin D deficiency 06/13/09    Patient Active Problem List   Diagnosis Date Noted  . Common bile duct stone 11/24/2016  . Abdominal pain 11/24/2016  . Pancreatitis, acute 11/24/2016  . Pulmonary nodules 11/18/2016  . Abnormal chest CT 11/14/2016  . Type 2 diabetes mellitus with hyperlipidemia (HCC) 12/04/2015  . Sinus congestion 02/18/2015  . GERD (gastroesophageal reflux disease) 03/08/2014  . NAFLD (nonalcoholic fatty liver disease) 78/29/5621  . Elevated liver enzymes 03/08/2014  . Ascending aortic aneurysm  (HCC) 12/12/2012  . INSOMNIA 09/15/2010  . Atrial fibrillation, chronic (HCC) 09/15/2010  . CAD (coronary artery disease) 07/09/2009  . AORTIC VALVE DISORDERS 05/27/2009  . OSTEOARTHRITIS, LOCALIZED 05/27/2009  . Diabetes mellitus type 2, uncontrolled, without complications (HCC) 05/24/2009  . Hyperlipidemia with target LDL less than 70 05/24/2009  . Hypokalemia 05/24/2009  . Essential hypertension 05/24/2009  . UNSPECIFIED SLEEP APNEA 05/24/2009    Past Surgical History:  Procedure Laterality Date  . bladder cancer - operation - 01/21/18 - Dr Berneice Heinrich  01/21/2018  . CARPAL TUNNEL RELEASE     bilateral   . CHOLECYSTECTOMY  11/2016  . CORONARY ANGIOPLASTY WITH STENT PLACEMENT     to rt coronary atery (Dr. Degert-cardiologist)  . ERCP N/A 11/24/2016   Procedure: ENDOSCOPIC RETROGRADE CHOLANGIOPANCREATOGRAPHY (ERCP);  Surgeon: Malissa Hippo, MD;  [facility];  Service: Endoscopy;  Laterality: N/A;  . REMOVAL OF STONES N/A 11/24/2016   Procedure: REMOVAL OF STONES;  Surgeon: Malissa Hippo, MD;  [facility];  Service: Endoscopy;  Laterality: N/A;  . SPHINCTEROTOMY N/A 11/24/2016   Procedure: SPHINCTEROTOMY;  Surgeon: Malissa Hippo, MD;  [facility];  Service: Endoscopy;  Laterality: N/A;  . TISSUE AORTIC VALVE REPLACEMENT     2012  Home Medications    Prior to Admission medications   Medication Sig Start Date End Date Taking? Authorizing Provider  aspirin EC 81 MG tablet Take 81 mg by mouth daily.   Yes [provider]  atenolol (TENORMIN) 25 MG tablet TAKE 1 TABLET DAILY 01/10/18  Yes Antoine Poche, MD  atorvastatin (LIPITOR) 40 MG tablet TAKE 1 TABLET DAILY 11/22/17  Yes Ernestina Penna, MD  cholecalciferol (VITAMIN D) 1000 UNITS tablet Take 2,000 Units by mouth daily.    Yes [provider]  diphenhydrAMINE (BENADRYL) 25 MG tablet Take 50 mg by mouth every 6 (six) hours as needed.   Yes [provider]    fluticasone (FLONASE) 50 MCG/ACT nasal spray Place 2 sprays into the nose daily as needed. 12/12/12  Yes Ernestina Penna, MD  glipiZIDE (GLUCOTROL) 5 MG tablet TAKE 1 TABLET TWICE A DAY BEFORE A MEAL (DISCONTINUE NATEGLINIDE/STARLIX) 02/07/18  Yes Ernestina Penna, MD  lisinopril (PRINIVIL,ZESTRIL) 20 MG tablet TAKE 1 TABLET DAILY 02/07/18  Yes Ernestina Penna, MD  metFORMIN (GLUCOPHAGE) 1000 MG tablet TAKE 1 TABLET TWICE DAILY WITH MEALS 02/28/18  Yes Ernestina Penna, MD  Multiple Vitamin (MULTIVITAMIN WITH MINERALS) TABS tablet Take 1 tablet by mouth daily.   Yes [provider]  promethazine (PHENERGAN) 25 MG tablet TAKE 1 TABLET EVERY 4 HOURS AS NEEDED 11/08/17  Yes Ernestina Penna, MD  temazepam (RESTORIL) 15 MG capsule TAKE 1 TABLET BY MOUTH AT BEDTIME AS NEEDED 10/12/17  Yes Ernestina Penna, MD  ANDROGEL PUMP 20.25 MG/ACT (1.62%) GEL Apply THREE pumps DAILY AS DIRECTED (COUPON CARD ON FILE) 05/11/17   Ernestina Penna, MD    Family History Family History  Problem Relation Age of Onset  . Hearing loss Mother   . Alzheimer's disease Mother   . Hearing loss Father   . Diabetes Father   . Diabetes Unknown        Family History     Social History Social History   Tobacco Use  . Smoking status: Former Smoker    Packs/day: 1.50    Years: 25.00    Pack years: 37.50    Types: Cigarettes    Start date: 07/06/1974    Last attempt to quit: 07/06/2002    Years since quitting: 15.6  . Smokeless tobacco: Never Used  Substance Use Topics  . Alcohol use: No    Alcohol/week: 0.0 standard drinks  . Drug use: No     Allergies   Glyxambi [empagliflozin-linagliptin]; Hctz [hydrochlorothiazide]; Sulfa antibiotics; and Sulfonamide derivatives   Review of Systems Review of Systems  Constitutional: Positive for chills. Negative for fever.  HENT: Negative for congestion.   Eyes: Negative for redness.  Respiratory: Negative for shortness of breath.   Cardiovascular: Negative for chest pain.   Gastrointestinal: Positive for abdominal pain, nausea and vomiting.  Genitourinary: Positive for difficulty urinating, flank pain and urgency.  Musculoskeletal: Positive for back pain.  Skin: Negative for rash.  Neurological: Negative for syncope and headaches.  Hematological: Does not bruise/bleed easily.  Psychiatric/Behavioral: Negative for confusion.     Physical Exam Updated Vital Signs BP 116/61 (BP Location: Right Arm)   Pulse 62   Temp 97.9 F (36.6 C) (Oral)   Resp 18   Ht 1.778 m (5\' 10" ) Comment: Simultaneous filing. User may not have seen previous data.  Wt 93.9 kg Comment: Simultaneous filing. User may not have seen previous data.  SpO2 93%   BMI 29.69 kg/m  Physical Exam  Constitutional: He is oriented to person, place, and time. He appears well-developed and well-nourished. No distress.  HENT:  Head: Normocephalic and atraumatic.  Mouth/Throat: Oropharynx is clear and moist.  Eyes: Pupils are equal, round, and reactive to light. EOM are normal.  Neck: Neck supple.  Cardiovascular: Normal rate, regular rhythm and normal heart sounds.  Pulmonary/Chest: Effort normal and breath sounds normal. No respiratory distress.  Abdominal: Soft. Bowel sounds are normal. There is tenderness.  Tenderness left lower quadrant area.  Musculoskeletal: Normal range of motion. He exhibits no edema.  Neurological: He is alert and oriented to person, place, and time. No cranial nerve deficit or sensory deficit. He exhibits normal muscle tone. Coordination normal.  Skin: Skin is warm.  Nursing note and vitals reviewed.    ED Treatments / Results  Labs (all labs ordered are listed, but only abnormal results are displayed) Labs Reviewed  BASIC METABOLIC PANEL - Abnormal; Notable for the following components:      Result Value   Glucose, Bld 245 (*)    BUN 29 (*)    Creatinine, Ser 1.59 (*)    GFR calc non Af Amer 45 (*)    GFR calc Af Amer 52 (*)    All other components  within normal limits  CBC - Abnormal; Notable for the following components:   WBC 11.1 (*)    All other components within normal limits  URINALYSIS, ROUTINE W REFLEX MICROSCOPIC    EKG None  Radiology Ct Renal Stone Study  Result Date: 03/07/2018 CLINICAL DATA:  Patient with left lower quadrant and back pain. EXAM: CT ABDOMEN AND PELVIS WITHOUT CONTRAST TECHNIQUE: Multidetector CT imaging of the abdomen and pelvis was performed following the standard protocol without IV contrast. COMPARISON:  CT abdomen pelvis 11/25/2016 FINDINGS: Lower chest: Normal heart size. Coronary arterial vascular calcifications. Dependent atelectasis within the bilateral lower lobes. Hepatobiliary: Liver is diffusely low in attenuation compatible with steatosis. Patient status post cholecystectomy. No intrahepatic or extrahepatic biliary ductal dilatation. Pancreas: Unremarkable Spleen: Unremarkable Adrenals/Urinary Tract: Adrenal glands are normal. There is a 1.2 cm obstructing stone within the proximal left ureter (image 50; series 2) resulting in mild to moderate left hydronephrosis. Multiple additional 2-4 mm stones are demonstrated within the kidneys bilaterally. Urinary bladder is unremarkable. Stomach/Bowel: Sigmoid colonic diverticulosis. No CT evidence for acute diverticulitis. Normal appendix. No evidence for small bowel obstruction. No free fluid or free intraperitoneal air. Normal morphology of the stomach. Vascular/Lymphatic: Normal caliber abdominal aorta. Peripheral calcified atherosclerotic plaque. No retroperitoneal lymphadenopathy. Reproductive: Prostate is unremarkable. Other: Small bilateral fat containing inguinal hernias, left-greater-than-right. Musculoskeletal: Serpiginous sclerosis left femoral head may represent AVN. Lumbar spine degenerative changes. No aggressive or acute appearing osseous lesions. IMPRESSION: 1. There is a 1.2 cm obstructing stone within the proximal left ureter. 2. Multiple  additional bilateral renal stones. 3. Hepatic steatosis. 4. Findings suggestive of AVN left femoral head. Electronically Signed   By: Annia Belt M.D.   On: 03/07/2018 11:09    Procedures Procedures (including critical care time)  Medications Ordered in ED Medications  0.9 %  sodium chloride infusion (1,000 mLs Intravenous New Bag/Given 03/07/18 1138)  HYDROmorphone (DILAUDID) injection 1 mg (has no administration in time range)  sodium chloride 0.9 % bolus 500 mL (0 mLs Intravenous Stopped 03/07/18 1120)  ondansetron (ZOFRAN) injection 4 mg (4 mg Intravenous Given 03/07/18 1035)  HYDROmorphone (DILAUDID) injection 1 mg (1 mg Intravenous Given 03/07/18 1035)     Initial Impression /  Assessment and Plan / ED Course  I have reviewed the triage vital signs and the nursing notes.  Pertinent labs & imaging results that were available during my care of the patient were reviewed by me and considered in my medical decision making (see chart for details).  Clinical Course as of Mar 15 817  Mon Mar 07, 2018  1345 I placed page to Dr. Berneice Heinrich to notify of patient arrival.   [MP]  1348 Dr. Berneice Heinrich notified of patient.   [MP]    Clinical Course User Index [MP] Arby Barrette, MD  Patient's findings show a large proximal left ureteral stone 1.2 cm.  Patient also with worsening renal function.  And patient with very poor p.o. intake still having some dry heaves for the past 24 hours.  Discussed with Dr. Kathrynn Running urology.  Patient with obstructing stone but not having p.o. intake increase in renal insufficiency recommending transfer to the emergency department at [facility] he will see the patient there and a stent will be placed.   Dr. Zelphia Cairo emergency medicine at [facility] the emergency department also made aware of the patient's transfer for Dr. Kathrynn Running to place a stent.  Patient will be transported by [facility].    Final Clinical Impressions(s) / ED Diagnoses   Final diagnoses:  Left ureteral  stone  Renal insufficiency    ED Discharge Orders    None       Vanetta Mulders, MD 03/07/18 1610    Vanetta Mulders, MD 03/15/18 [phone]

## 2018-03-07 NOTE — Anesthesia Procedure Notes (Signed)
Procedure Name: LMA Insertion Date/Time: 03/07/2018 3:14 PM Performed by: Lorelee Market, CRNA Pre-anesthesia Checklist: Patient identified, Emergency Drugs available, Suction available, Patient being monitored and Timeout performed Patient Re-evaluated:Patient Re-evaluated prior to induction Oxygen Delivery Method: Circle system utilized Preoxygenation: Pre-oxygenation with 100% oxygen Induction Type: IV induction LMA: LMA inserted LMA Size: 4.0 Dental Injury: Teeth and Oropharynx as per pre-operative assessment

## 2018-03-07 NOTE — Discharge Instructions (Signed)
1 - You may have urinary urgency (bladder spasms) and bloody urine on / off with stent in place. This is normal. ° °2 - Call MD or go to ER for fever >102, severe pain / nausea / vomiting not relieved by medications, or acute change in medical status ° °

## 2018-03-07 NOTE — ED Notes (Signed)
Pt arrived from [facility] to see Dr Berneice Heinrich for Consult

## 2018-03-07 NOTE — Brief Op Note (Signed)
03/07/2018  3:28 PM  PATIENT:  [name] [name]  62 y.o. male  PRE-OPERATIVE DIAGNOSIS:  left ureteral obstruction  POST-OPERATIVE DIAGNOSIS:  left ureteral obstruction  PROCEDURE:  Procedure(s): CYSTOSCOPY WITH RETROGRADE PYELOGRAM,  AND STENT PLACEMENT (Left)  SURGEON:  Surgeon(s) and Role:    * Sebastian Ache, MD - Primary  PHYSICIAN ASSISTANT:   ASSISTANTS: none   ANESTHESIA:   general  EBL:  5 mL   BLOOD ADMINISTERED:none  DRAINS: none   LOCAL MEDICATIONS USED:  NONE  SPECIMEN:  No Specimen  DISPOSITION OF SPECIMEN:  N/A  COUNTS:  YES  TOURNIQUET:  * No tourniquets in log *  DICTATION: .Other Dictation: Dictation Number [id number]  PLAN OF CARE: Discharge to home after PACU  PATIENT DISPOSITION:  PACU - hemodynamically stable.   Delay start of Pharmacological VTE agent (>24hrs) due to surgical blood loss or risk of bleeding: yes

## 2018-03-07 NOTE — Anesthesia Preprocedure Evaluation (Signed)
Anesthesia Evaluation  Patient identified by MRN, date of birth, ID band Patient awake    Reviewed: Allergy & Precautions, NPO status , Patient's Chart, lab work & pertinent test results, reviewed documented beta blocker date and time   Airway Mallampati: II  TM Distance: >3 FB Neck ROM: Full    Dental  (+) Dental Advisory Given   Pulmonary sleep apnea , former smoker,    breath sounds clear to auscultation       Cardiovascular hypertension, Pt. on medications and Pt. on home beta blockers + CAD and + Peripheral Vascular Disease  + Valvular Problems/Murmurs (s/p AVR. Mild gradient of mean on 2018 Echo. Normal EF.)  Rhythm:Regular Rate:Normal     Neuro/Psych negative neurological ROS     GI/Hepatic Neg liver ROS, GERD  ,  Endo/Other  diabetes, Type 2, Oral Hypoglycemic Agents  Renal/GU Renal InsufficiencyRenal disease     Musculoskeletal  (+) Arthritis ,   Abdominal   Peds  Hematology negative hematology ROS (+)   Anesthesia Other Findings   Reproductive/Obstetrics                             Lab Results  Component Value Date   WBC 11.1 (H) 03/07/2018   HGB 14.3 03/07/2018   HCT 41.8 03/07/2018   MCV 97.2 03/07/2018   PLT 197 03/07/2018   Lab Results  Component Value Date   CREATININE 1.59 (H) 03/07/2018   BUN 29 (H) 03/07/2018   NA 138 03/07/2018   K 4.6 03/07/2018   CL 104 03/07/2018   CO2 22 03/07/2018    Anesthesia Physical Anesthesia Plan  ASA: III  Anesthesia Plan: General   Post-op Pain Management:    Induction: Intravenous  PONV Risk Score and Plan: 2 and Dexamethasone and Ondansetron  Airway Management Planned: LMA  Additional Equipment:   Intra-op Plan:   Post-operative Plan: Extubation in OR  Informed Consent: I have reviewed the patients History and Physical, chart, labs and discussed the procedure including the risks, benefits and  alternatives for the proposed anesthesia with the patient or authorized representative who has indicated his/her understanding and acceptance.   Dental advisory given  Plan Discussed with: CRNA  Anesthesia Plan Comments:         Anesthesia Quick Evaluation

## 2018-03-07 NOTE — ED Notes (Signed)
Patient aware of transfer to [facility], verbalized understanding. [facility] called.

## 2018-03-08 ENCOUNTER — Encounter (HOSPITAL_COMMUNITY): Payer: Self-pay | Admitting: Urology

## 2018-03-08 NOTE — Transfer of Care (Signed)
Immediate Anesthesia Transfer of Care Note  Patient: [name] [name]  Procedure(s) Performed: CYSTOSCOPY WITH RETROGRADE PYELOGRAM,  AND STENT PLACEMENT (Left Ureter)  Patient Location: PACU  Anesthesia Type:General  Level of Consciousness: awake, alert  and oriented  Airway & Oxygen Therapy: Patient Spontanous Breathing and Patient connected to face mask oxygen  Post-op Assessment: Report given to RN, Post -op Vital signs reviewed and stable and Patient moving all extremities  Post vital signs: Reviewed and stable  Last Vitals:  Vitals Value Taken Time  BP    Temp    Pulse    Resp    SpO2      Last Pain:  Vitals:   03/07/18 1610  TempSrc:   PainSc: 0-No pain         Complications: No apparent anesthesia complications

## 2018-03-09 ENCOUNTER — Other Ambulatory Visit: Payer: Self-pay | Admitting: Urology

## 2018-03-11 NOTE — Anesthesia Postprocedure Evaluation (Signed)
Anesthesia Post Note  Patient: [name] [name]  Procedure(s) Performed: CYSTOSCOPY WITH RETROGRADE PYELOGRAM,  AND STENT PLACEMENT (Left Ureter)     Patient location during evaluation: PACU Anesthesia Type: General Level of consciousness: awake and alert Pain management: pain level controlled Vital Signs Assessment: post-procedure vital signs reviewed and stable Respiratory status: spontaneous breathing, nonlabored ventilation, respiratory function stable and patient connected to nasal cannula oxygen Cardiovascular status: blood pressure returned to baseline and stable Postop Assessment: no apparent nausea or vomiting Anesthetic complications: no    Last Vitals:  Vitals:   03/07/18 1600 03/07/18 1610  BP: (!) 114/55 110/71  Pulse: 87 85  Resp: 20 17  Temp:  36.7 C  SpO2: 95% 95%    Last Pain:  Vitals:   03/07/18 1610  TempSrc:   PainSc: 0-No pain                 Kennieth Rad

## 2018-03-16 NOTE — H&P (Signed)
CC: I have kidney stones.  HPI: [name] [name] is a 62 year-old male established patient who is here for renal calculi.  The problem is on the left side. This is not his first kidney stone. His first stone was approximately 01/03/1997. He has had more than 5 stones prior to getting this one. He is not currently having flank pain, back pain, groin pain, nausea, vomiting, fever or chills. He has not caught a stone in his urine strainer since his symptoms began.   He has had ureteral stent and ureteroscopy for treatment of his stones in the past.   CT stone protocol was performed in December2016. There was a 13 mm left lower pole stone, small bilateral renal calculi additionally.   24 hour urine revealed normal urine volume, normal urine calcium, slightly high urine oxalate. Increased supersaturation of calcium oxalate. Slightly low magnesium level, high sodium.   7.24.2018: No stone events since last visit. KUB shows 8mm left lower pole calculus which is stable.   7.30.2019: No stone events over the past year. Occasional dysuria when he drinks beverages other than H2O. No gross hematuria or flank pain. Recent KUB--stable LLP stone.   7.30.2019:     ALLERGIES: Hydro-chlorthiazide Sulfa Drugs    MEDICATIONS: Adult Aspirin Low Strength 81 MG TBDP 0 Oral  AndroGel Pump 20.25 MG/ACT (1.62%) Transdermal Gel Transdermal  Atenolol 50 mg tablet Oral  Flonase 50 mcg/actuation spray, suspension Nasal  Glipizide 5 mg tablet 1 Oral Daily  Glyburide  Lipitor 40 mg tablet 1 Oral Daily  Lisinopril 20 mg tablet Oral  Metformin Hcl 500 mg tablet 2 Oral  Nitroglycerin 0.4 MG Sublingual Tablet Sublingual Sublingual  Temazepam 15 MG Oral Capsule Oral  Vitamin D TABS Oral  Zantac 300 mg tablet Oral     GU PSH: Cystoscopy Insert Stent - 2011 ESWL - 2016 Ureteroscopic stone removal - 2011      PSH Notes: Neuroplasty Median Nerve At Carpal Tunnel, Foot Surgery, Renal Lithotripsy, Heart Valve Replacement,  Cystoscopy With Ureteroscopy With Removal Of Calculus, Cystoscopy With Insertion Of Ureteral Stent Left   NON-GU PSH: Carpal Tunnel Surgery.. - 2016    GU PMH: Renal calculus - 01/26/2017, - 2017, Nephrolithiasis, - 2017 Primary hypogonadism - 2017, Low testosterone level in male, - 2017 Other microscopic hematuria, Microscopic hematuria - 2017 ED due to arterial insufficiency, Erectile dysfunction due to arterial insufficiency - 2016 Abdominal Pain Unspec, Right flank pain - 2015 Flank Pain, Generalized abdominal pain - 2014 Ureteral calculus, Calculus of ureter - 2014    NON-GU PMH: Anxiety, Anxiety - 2016 Personal history of other diseases of the circulatory system, History of cardiac disorder - 2016, History of hypertension, - 2016, History of cardiac murmur, - 2016, History of aortic aneurysm, - 2015 Personal history of other diseases of the digestive system, History of esophageal reflux - 2016 Personal history of other diseases of the musculoskeletal system and connective tissue, History of arthritis - 2016 Personal history of other diseases of the nervous system and sense organs, History of sleep apnea - 2016 Personal history of other endocrine, nutritional and metabolic disease, History of diabetes mellitus - 2016, History of hypercholesterolemia, - 2016 Encounter for general adult medical examination without abnormal findings, Encounter for preventive health examination - 2015 Nausea, Nausea - 2015    FAMILY HISTORY: cardiac disorder - Runs In Family Death of family member - Runs In Family Hypertension - Runs In Family Recurrent nephrolithiasis - Runs In Family   SOCIAL HISTORY: None  Notes: Married, Retired, Caffeine use, Former smoker   REVIEW OF SYSTEMS:    GU Review Male:   Patient reports burning/ pain with urination and get up at night to urinate. Patient denies frequent urination, hard to postpone urination, leakage of urine, stream starts and stops, trouble starting  your stream, have to strain to urinate , erection problems, and penile pain.  Gastrointestinal (Upper):   Patient denies nausea, vomiting, and indigestion/ heartburn.  Gastrointestinal (Lower):   Patient reports constipation. Patient denies diarrhea.  Constitutional:   Patient reports night sweats. Patient denies fever, weight loss, and fatigue.  Skin:   Patient denies skin rash/ lesion and itching.  Eyes:   Patient denies blurred vision and double vision.  Ears/ Nose/ Throat:   Patient reports sinus problems. Patient denies sore throat.  Hematologic/Lymphatic:   Patient denies swollen glands and easy bruising.  Cardiovascular:   Patient denies leg swelling and chest pains.  Respiratory:   Patient reports cough. Patient denies shortness of breath.  Endocrine:   Patient reports excessive thirst.   Musculoskeletal:   Patient denies back pain and joint pain.  Neurological:   Patient reports dizziness. Patient denies headaches.  Psychologic:   Patient denies depression and anxiety.   VITAL SIGNS:      02/01/2018 10:19 AM  BP 111/70 mmHg  Pulse 61 /min  Temperature 98.5 F / 36.9 C   GU PHYSICAL EXAMINATION:    Anus and Perineum: No hemorrhoids. No anal stenosis. No rectal fissure, no anal fissure. No edema, no dimple, no perineal tenderness, no anal tenderness.  Scrotum: No lesions. No edema. No cysts. No warts.  Epididymides: Right: no spermatocele, no masses, no cysts, no tenderness, no induration, no enlargement. Left: no spermatocele, no masses, no cysts, no tenderness, no induration, no enlargement.  Testes: No tenderness, no swelling, no enlargement left testes. No tenderness, no swelling, no enlargement right testes. Normal location left testes. Normal location right testes. No mass, no cyst, no varicocele, no hydrocele left testes. No mass, no cyst, no varicocele, no hydrocele right testes.  Urethral Meatus: Normal size. No lesion, no wart, no discharge, no polyp. Normal location.   Penis: Circumcised, no warts, no cracks. No dorsal Peyronie's plaques, no left corporal Peyronie's plaques, no right corporal Peyronie's plaques, no scarring, no warts. No balanitis, no meatal stenosis.  Prostate: 40 gram or 2+ size. Left lobe normal consistency, right lobe normal consistency. Symmetrical lobes. No prostate nodule. Left lobe no tenderness, right lobe no tenderness.  Seminal Vesicles: Nonpalpable.  Sphincter Tone: Normal sphincter. No rectal tenderness. No rectal mass.    MULTI-SYSTEM PHYSICAL EXAMINATION:    Constitutional: Well-nourished. No physical deformities. Normally developed. Good grooming.  Neck: Neck symmetrical, not swollen. Normal tracheal position.  Respiratory: No labored breathing, no use of accessory muscles.   Skin: No paleness, no jaundice, no cyanosis. No lesion, no ulcer, no rash.  Neurologic / Psychiatric: Oriented to time, oriented to place, oriented to person. No depression, no anxiety, no agitation.  Eyes: Normal conjunctivae. Normal eyelids.  Ears, Nose, Mouth, and Throat: Left ear no scars, no lesions, no masses. Right ear no scars, no lesions, no masses. Nose no scars, no lesions, no masses. Normal hearing. Normal lips.  Musculoskeletal: Normal gait and station of head and neck.     PAST DATA REVIEWED:  Source Of History:  Patient  Urine Test Review:   Urinalysis  X-Ray Review: KUB: Reviewed Films. Reviewed Report. Discussed With Patient.  C.T. Stone Protocol: Reviewed Films.  Reviewed Report. Discussed With Patient. 2018 films reviewed    PROCEDURES:          Urinalysis - 81003 Dipstick Dipstick Cont'd  Specimen: Voided Bilirubin: Neg  Color: Yellow Ketones: Neg  Appearance: Clear Blood: Trace Intact  Specific Gravity: >= 1.030 Protein: 2+  pH: 5.0 Urobilinogen: 0.2  Glucose: 1+ Nitrites: Neg    Leukocyte Esterase: Neg    ASSESSMENT:      ICD-10 Details  1 GU:   Renal calculus - N20.0 Stable - Stable LLP stone   PLAN:             Medications Stop Meds: Jardiance 25 mg tablet  Discontinue: 02/01/2018  - Reason: The medication cycle was completed.  Mucinex 600 mg tablet, extended release 12 hr Oral  Start: 06/18/2015  Discontinue: 02/01/2018  - Reason: The medication cycle was completed.  Simethicone 80 mg tablet,chewable 2 tablet PO QID PRN  Discontinue: 02/01/2018  - Reason: The medication cycle was completed.            Document Letter(s):  Created for Patient: Clinical Summary         Notes:   1. Dietary mgmt discussed   2. RTO prn

## 2018-03-17 ENCOUNTER — Encounter (HOSPITAL_COMMUNITY): Payer: Self-pay | Admitting: General Practice

## 2018-03-17 ENCOUNTER — Ambulatory Visit (HOSPITAL_COMMUNITY): Payer: BLUE CROSS/BLUE SHIELD

## 2018-03-17 ENCOUNTER — Ambulatory Visit (HOSPITAL_COMMUNITY)
Admission: RE | Admit: 2018-03-17 | Discharge: 2018-03-17 | Disposition: A | Discharge status: Home / Self Care | Payer: BLUE CROSS/BLUE SHIELD | Source: Ambulatory Visit | Attending: Urology | Admitting: Urology

## 2018-03-17 ENCOUNTER — Encounter (HOSPITAL_COMMUNITY)
Admission: RE | Disposition: A | Discharge status: Home / Self Care | Payer: Self-pay | Source: Ambulatory Visit | Attending: Urology

## 2018-03-17 DIAGNOSIS — Z7982 Long term (current) use of aspirin: Secondary | ICD-10-CM | POA: Insufficient documentation

## 2018-03-17 DIAGNOSIS — Z79899 Other long term (current) drug therapy: Secondary | ICD-10-CM | POA: Insufficient documentation

## 2018-03-17 DIAGNOSIS — E119 Type 2 diabetes mellitus without complications: Secondary | ICD-10-CM | POA: Insufficient documentation

## 2018-03-17 DIAGNOSIS — K219 Gastro-esophageal reflux disease without esophagitis: Secondary | ICD-10-CM | POA: Diagnosis not present

## 2018-03-17 DIAGNOSIS — N200 Calculus of kidney: Secondary | ICD-10-CM | POA: Diagnosis not present

## 2018-03-17 DIAGNOSIS — N201 Calculus of ureter: Secondary | ICD-10-CM

## 2018-03-17 DIAGNOSIS — Z882 Allergy status to sulfonamides status: Secondary | ICD-10-CM | POA: Diagnosis not present

## 2018-03-17 DIAGNOSIS — F419 Anxiety disorder, unspecified: Secondary | ICD-10-CM | POA: Insufficient documentation

## 2018-03-17 DIAGNOSIS — I209 Angina pectoris, unspecified: Secondary | ICD-10-CM | POA: Diagnosis not present

## 2018-03-17 DIAGNOSIS — E291 Testicular hypofunction: Secondary | ICD-10-CM | POA: Diagnosis not present

## 2018-03-17 DIAGNOSIS — Z7984 Long term (current) use of oral hypoglycemic drugs: Secondary | ICD-10-CM | POA: Diagnosis not present

## 2018-03-17 DIAGNOSIS — Z888 Allergy status to other drugs, medicaments and biological substances status: Secondary | ICD-10-CM | POA: Insufficient documentation

## 2018-03-17 DIAGNOSIS — Z7901 Long term (current) use of anticoagulants: Secondary | ICD-10-CM | POA: Insufficient documentation

## 2018-03-17 DIAGNOSIS — M1990 Unspecified osteoarthritis, unspecified site: Secondary | ICD-10-CM | POA: Insufficient documentation

## 2018-03-17 DIAGNOSIS — N5201 Erectile dysfunction due to arterial insufficiency: Secondary | ICD-10-CM | POA: Diagnosis not present

## 2018-03-17 DIAGNOSIS — I10 Essential (primary) hypertension: Secondary | ICD-10-CM | POA: Diagnosis not present

## 2018-03-17 DIAGNOSIS — Z8249 Family history of ischemic heart disease and other diseases of the circulatory system: Secondary | ICD-10-CM | POA: Diagnosis not present

## 2018-03-17 HISTORY — DX: Personal history of urinary calculi: Z87.442

## 2018-03-17 HISTORY — PX: EXTRACORPOREAL SHOCK WAVE LITHOTRIPSY: SHX1557

## 2018-03-17 LAB — GLUCOSE, CAPILLARY: Glucose-Capillary: 176 mg/dL — ABNORMAL HIGH (ref 70–99)

## 2018-03-17 SURGERY — LITHOTRIPSY, ESWL
Anesthesia: LOCAL | Laterality: Left

## 2018-03-17 MED ORDER — SODIUM CHLORIDE 0.9 % IV SOLN
INTRAVENOUS | Status: DC
  Administered 2018-03-17: 08:00:00 via INTRAVENOUS

## 2018-03-17 MED ORDER — DIPHENHYDRAMINE HCL 25 MG PO CAPS
25.0000 mg | ORAL_CAPSULE | ORAL | Status: AC
  Administered 2018-03-17: 25 mg via ORAL
  Filled 2018-03-17: qty 1

## 2018-03-17 MED ORDER — DIAZEPAM 5 MG PO TABS
10.0000 mg | ORAL_TABLET | ORAL | Status: AC
  Administered 2018-03-17: 10 mg via ORAL
  Filled 2018-03-17: qty 2

## 2018-03-17 MED ORDER — CIPROFLOXACIN HCL 500 MG PO TABS
500.0000 mg | ORAL_TABLET | ORAL | Status: AC
  Administered 2018-03-17: 500 mg via ORAL
  Filled 2018-03-17: qty 1

## 2018-03-17 NOTE — Interval H&P Note (Signed)
History and Physical Interval Note: stone now in LLP.  Stent in good position.   03/17/2018 9:49 AM  [name] [name]  has presented today for surgery, with the diagnosis of LEFT URETERAL STONE  The various methods of treatment have been discussed with the patient and family. After consideration of risks, benefits and other options for treatment, the patient has consented to  Procedure(s): LEFT EXTRACORPOREAL SHOCK WAVE LITHOTRIPSY (ESWL) (Left) as a surgical intervention .  The patient's history has been reviewed, patient examined, no change in status, stable for surgery.  I have reviewed the patient's chart and labs.  Questions were answered to the patient's satisfaction.     [name] PippinJohn Caleel [name]

## 2018-03-17 NOTE — Discharge Instructions (Signed)
Lithotripsy, Care After °This sheet gives you information about how to care for yourself after your procedure. Your health care provider may also give you more specific instructions. If you have problems or questions, contact your health care provider. °What can I expect after the procedure? °After the procedure, it is common to have: °· Some blood in your urine. This should only last for a few days. °· Soreness in your back, sides, or upper abdomen for a few days. °· Blotches or bruises on your back where the pressure wave entered the skin. °· Pain, discomfort, or nausea when pieces (fragments) of the kidney stone move through the tube that carries urine from the kidney to the bladder (ureter). Stone fragments may pass soon after the procedure, but they may continue to pass for up to 4-8 weeks. °? If you have severe pain or nausea, contact your health care provider. This may be caused by a large stone that was not broken up, and this may mean that you need more treatment. °· Some pain or discomfort during urination. °· Some pain or discomfort in the lower abdomen or (in men) at the base of the penis. ° °Follow these instructions at home: °Medicines °· Take over-the-counter and prescription medicines only as told by your health care provider. °· If you were prescribed an antibiotic medicine, take it as told by your health care provider. Do not stop taking the antibiotic even if you start to feel better. °· Do not drive for 24 hours if you were given a medicine to help you relax (sedative). °· Do not drive or use heavy machinery while taking prescription pain medicine. °Eating and drinking °· Drink enough water and fluids to keep your urine clear or pale yellow. This helps any remaining pieces of the stone to pass. It can also help prevent new stones from forming. °· Eat plenty of fresh fruits and vegetables. °· Follow instructions from your health care provider about eating and drinking restrictions. You may be  instructed: °? To reduce how much salt (sodium) you eat or drink. Check ingredients and nutrition facts on packaged foods and beverages. °? To reduce how much meat you eat. °· Eat the recommended amount of calcium for your age and gender. Ask your health care provider how much calcium you should have. °General instructions °· Get plenty of rest. °· Most people can resume normal activities 1-2 days after the procedure. Ask your health care provider what activities are safe for you. °· If directed, strain all urine through the strainer that was provided by your health care provider. °? Keep all fragments for your health care provider to see. Any stones that are found may be sent to a medical lab for examination. The stone may be as small as a grain of salt. °· Keep all follow-up visits as told by your health care provider. This is important. °Contact a health care provider if: °· You have pain that is severe or does not get better with medicine. °· You have nausea that is severe or does not go away. °· You have blood in your urine longer than your health care provider told you to expect. °· You have more blood in your urine. °· You have pain during urination that does not go away. °· You urinate more frequently than usual and this does not go away. °· You develop a rash or any other possible signs of an allergic reaction. °Get help right away if: °· You have severe pain in   your back, sides, or upper abdomen. °· You have severe pain while urinating. °· Your urine is very dark red. °· You have blood in your stool (feces). °· You cannot pass any urine at all. °· You feel a strong urge to urinate after emptying your bladder. °· You have a fever or chills. °· You develop shortness of breath, difficulty breathing, or chest pain. °· You have severe nausea that leads to persistent vomiting. °· You faint. °Summary °· After this procedure, it is common to have some pain, discomfort, or nausea when pieces (fragments) of the  kidney stone move through the tube that carries urine from the kidney to the bladder (ureter). If this pain or nausea is severe, however, you should contact your health care provider. °· Most people can resume normal activities 1-2 days after the procedure. Ask your health care provider what activities are safe for you. °· Drink enough water and fluids to keep your urine clear or pale yellow. This helps any remaining pieces of the stone to pass, and it can help prevent new stones from forming. °· If directed, strain your urine and keep all fragments for your health care provider to see. Fragments or stones may be as small as a grain of salt. °· Get help right away if you have severe pain in your back, sides, or upper abdomen or have severe pain while urinating. °This information is not intended to replace advice given to you by your health care provider. Make sure you discuss any questions you have with your health care provider. °Document Released: 07/12/2007 Document Revised: 05/13/2016 Document Reviewed: 05/13/2016 °Elsevier Interactive Patient Education © 2018 [employer] ° °

## 2018-03-17 NOTE — Interval H&P Note (Signed)
History and Physical Interval Note:  [name] [name] is in the LLP with the stent in good position. 03/17/2018 9:59 AM  [name] [name]  has presented today for surgery, with the diagnosis of LEFT URETERAL STONE  The various methods of treatment have been discussed with the patient and family. After consideration of risks, benefits and other options for treatment, the patient has consented to  Procedure(s): LEFT EXTRACORPOREAL SHOCK WAVE LITHOTRIPSY (ESWL) (Left) as a surgical intervention .  The patient's history has been reviewed, patient examined, no change in status, stable for surgery.  I have reviewed the patient's chart and labs.  Questions were answered to the patient's satisfaction.     Bjorn PippinJohn Kealan Buchan

## 2018-03-28 ENCOUNTER — Encounter (HOSPITAL_COMMUNITY): Payer: Self-pay | Admitting: Urology

## 2018-04-01 DIAGNOSIS — R8271 Bacteriuria: Secondary | ICD-10-CM | POA: Diagnosis not present

## 2018-04-01 DIAGNOSIS — N200 Calculus of kidney: Secondary | ICD-10-CM | POA: Diagnosis not present

## 2018-04-17 ENCOUNTER — Other Ambulatory Visit: Payer: Self-pay | Admitting: Family Medicine

## 2018-04-24 ENCOUNTER — Other Ambulatory Visit: Payer: Self-pay | Admitting: Family Medicine

## 2018-04-26 ENCOUNTER — Ambulatory Visit (INDEPENDENT_AMBULATORY_CARE_PROVIDER_SITE_OTHER): Payer: BLUE CROSS/BLUE SHIELD | Admitting: Internal Medicine

## 2018-04-26 NOTE — Telephone Encounter (Signed)
Last seen 02/24/18

## 2018-05-05 ENCOUNTER — Ambulatory Visit (INDEPENDENT_AMBULATORY_CARE_PROVIDER_SITE_OTHER): Payer: BLUE CROSS/BLUE SHIELD

## 2018-05-05 DIAGNOSIS — Z23 Encounter for immunization: Secondary | ICD-10-CM

## 2018-05-09 ENCOUNTER — Other Ambulatory Visit: Payer: Self-pay | Admitting: Family Medicine

## 2018-05-09 DIAGNOSIS — N200 Calculus of kidney: Secondary | ICD-10-CM | POA: Diagnosis not present

## 2018-05-29 ENCOUNTER — Other Ambulatory Visit: Payer: Self-pay | Admitting: Family Medicine

## 2018-06-21 ENCOUNTER — Other Ambulatory Visit: Payer: Self-pay | Admitting: Family Medicine

## 2018-06-21 DIAGNOSIS — E119 Type 2 diabetes mellitus without complications: Secondary | ICD-10-CM | POA: Diagnosis not present

## 2018-06-21 DIAGNOSIS — H903 Sensorineural hearing loss, bilateral: Secondary | ICD-10-CM | POA: Diagnosis not present

## 2018-06-21 DIAGNOSIS — H6123 Impacted cerumen, bilateral: Secondary | ICD-10-CM | POA: Diagnosis not present

## 2018-06-21 MED ORDER — GLIPIZIDE 5 MG PO TABS: 5.0000 mg | ORAL_TABLET | Freq: Two times a day (BID) | ORAL | 3 refills | Status: DC

## 2018-06-21 NOTE — Telephone Encounter (Signed)
Did you change this to 2 tabs PO twice daily. Please advise

## 2018-06-21 NOTE — Telephone Encounter (Signed)
Please change prescription for patient to take 2 tablets twice daily as that is what he is currently doing so he has enough medicine to last him for 3 months at a time

## 2018-06-21 NOTE — Telephone Encounter (Signed)
Please confirm patient's current treatment I was of the understanding it was 5 mg, 2 tablets twice daily????

## 2018-06-30 ENCOUNTER — Encounter: Payer: Self-pay | Admitting: Family Medicine

## 2018-06-30 ENCOUNTER — Telehealth: Payer: Self-pay

## 2018-06-30 NOTE — Telephone Encounter (Signed)
This patient sent a message to try and get prior auth. For Jardiance.  I tried to do it without receiving anything from drug store but on cover my meds with info we have they can't find patient.   I need something initiated from pharmacy to get correct drug coverage information.

## 2018-07-01 ENCOUNTER — Other Ambulatory Visit: Payer: Self-pay | Admitting: *Deleted

## 2018-07-01 ENCOUNTER — Telehealth: Payer: Self-pay

## 2018-07-01 MED ORDER — EMPAGLIFLOZIN 25 MG PO TABS: 25.0000 mg | ORAL_TABLET | Freq: Every day | ORAL | 0 refills | Status: DC

## 2018-07-01 NOTE — Telephone Encounter (Signed)
Can we call in med again (not in med list) It will need a PA - Debbie aware

## 2018-07-01 NOTE — Telephone Encounter (Signed)
Just sent a note regarding restarting Jardiance.  It should never have been stopped.

## 2018-07-01 NOTE — Telephone Encounter (Signed)
[facility]

## 2018-07-18 ENCOUNTER — Ambulatory Visit: Payer: BLUE CROSS/BLUE SHIELD | Admitting: Family Medicine

## 2018-07-27 ENCOUNTER — Other Ambulatory Visit: Payer: Self-pay | Admitting: Family Medicine

## 2018-07-28 NOTE — Telephone Encounter (Signed)
Last seen 02/24/18

## 2018-08-02 ENCOUNTER — Ambulatory Visit (INDEPENDENT_AMBULATORY_CARE_PROVIDER_SITE_OTHER): Payer: BLUE CROSS/BLUE SHIELD | Admitting: Family Medicine

## 2018-08-02 ENCOUNTER — Encounter: Payer: Self-pay | Admitting: Family Medicine

## 2018-08-02 VITALS — BP 129/61 | HR 60 | Temp 98.5°F | Ht 70.0 in | Wt 207.0 lb

## 2018-08-02 DIAGNOSIS — E349 Endocrine disorder, unspecified: Secondary | ICD-10-CM | POA: Diagnosis not present

## 2018-08-02 DIAGNOSIS — R1111 Vomiting without nausea: Secondary | ICD-10-CM

## 2018-08-02 DIAGNOSIS — I7121 Aneurysm of the ascending aorta, without rupture: Secondary | ICD-10-CM

## 2018-08-02 DIAGNOSIS — R0989 Other specified symptoms and signs involving the circulatory and respiratory systems: Secondary | ICD-10-CM

## 2018-08-02 DIAGNOSIS — E1169 Type 2 diabetes mellitus with other specified complication: Secondary | ICD-10-CM | POA: Diagnosis not present

## 2018-08-02 DIAGNOSIS — E559 Vitamin D deficiency, unspecified: Secondary | ICD-10-CM

## 2018-08-02 DIAGNOSIS — IMO0001 Reserved for inherently not codable concepts without codable children: Secondary | ICD-10-CM

## 2018-08-02 DIAGNOSIS — I712 Thoracic aortic aneurysm, without rupture: Secondary | ICD-10-CM

## 2018-08-02 DIAGNOSIS — K219 Gastro-esophageal reflux disease without esophagitis: Secondary | ICD-10-CM

## 2018-08-02 DIAGNOSIS — K625 Hemorrhage of anus and rectum: Secondary | ICD-10-CM

## 2018-08-02 DIAGNOSIS — E785 Hyperlipidemia, unspecified: Secondary | ICD-10-CM

## 2018-08-02 DIAGNOSIS — I10 Essential (primary) hypertension: Secondary | ICD-10-CM

## 2018-08-02 DIAGNOSIS — Z0000 Encounter for general adult medical examination without abnormal findings: Secondary | ICD-10-CM | POA: Diagnosis not present

## 2018-08-02 DIAGNOSIS — N400 Enlarged prostate without lower urinary tract symptoms: Secondary | ICD-10-CM | POA: Diagnosis not present

## 2018-08-02 DIAGNOSIS — E1165 Type 2 diabetes mellitus with hyperglycemia: Secondary | ICD-10-CM

## 2018-08-02 LAB — BAYER DCA HB A1C WAIVED: HB A1C (BAYER DCA - WAIVED): 9.5 % — ABNORMAL HIGH (ref <7.0)

## 2018-08-02 MED ORDER — GLIPIZIDE 5 MG PO TABS: 10.0000 mg | ORAL_TABLET | Freq: Two times a day (BID) | ORAL | 3 refills | Status: DC

## 2018-08-02 NOTE — Addendum Note (Signed)
Addended by: Magdalene River on: 08/02/2018 05:37 PM   Modules accepted: Orders

## 2018-08-02 NOTE — Progress Notes (Signed)
Subjective:    Patient ID: [name] [name], male    DOB: 1955/11/04, 63 y.o.   MRN: [id number]  HPI Patient is here today for annual wellness exam and follow up of chronic medical problems which includes diabetes and hypertension. He is taking medication regularly.  The patient has been doing well overall but he has had some problems with taking the Jardiance.  He was off of it for a long period of time and then the insurance company allowed him to go back on it and he developed some vomiting and rectal bleeding that lasted for 5 days and he went off the medicine and the stopped and he is afraid to go back on it.  He is currently taking glipizide 10 mg twice daily and he is on metformin 1000 mg twice daily.  His sugars have still not been under as good control as he would like and has had some sugars as high as 300 range.  Otherwise fasting have been in the 1 40-1 60 range.  Seem to run higher in the evening.  He denies any chest pain pressure tightness or shortness of breath.  He does have episodes of bloating and gas and he did see the gastroenterologist and he was told he had a fatty liver.  He occasionally has some left upper quadrant pain with this.  This is been going on for about a year off and on.  He only saw the midlevel and did not see the actual gastroenterologist.  Will probably work at schedule him to see the gastroenterologist about these recurring episodes of bloating gas that continue to occur.  He is passing his water without problems.  Home blood sugars that were brought in were reviewed and there will be scanned into the record.  He sees the eye doctor again in June.  He does have testosterone deficiency and feels that this is working appropriately.   Patient Active Problem List   Diagnosis Date Noted  . Common bile duct stone 11/24/2016  . Abdominal pain 11/24/2016  . Pancreatitis, acute 11/24/2016  . Pulmonary nodules 11/18/2016  . Abnormal chest CT 11/14/2016  . Type 2 diabetes  mellitus with hyperlipidemia ([place]) 12/04/2015  . Sinus congestion 02/18/2015  . GERD (gastroesophageal reflux disease) 03/08/2014  . NAFLD (nonalcoholic fatty liver disease) 03/08/2014  . Elevated liver enzymes 03/08/2014  . Ascending aortic aneurysm ([place]) 12/12/2012  . INSOMNIA 09/15/2010  . Atrial fibrillation, chronic 09/15/2010  . CAD (coronary artery disease) 07/09/2009  . AORTIC VALVE DISORDERS 05/27/2009  . OSTEOARTHRITIS, LOCALIZED 05/27/2009  . Diabetes mellitus type 2, uncontrolled, without complications ([place]) 96/75/9163  . Hyperlipidemia with target LDL less than 70 05/24/2009  . Hypokalemia 05/24/2009  . Essential hypertension 05/24/2009  . UNSPECIFIED SLEEP APNEA 05/24/2009   Outpatient Encounter Medications as of 08/02/2018  Medication Sig  . ANDROGEL PUMP 20.25 MG/ACT (1.62%) GEL APPLY THREE PUMPS AS DIRECTED - ON NEXT fill, run generic 11/23 ad  . aspirin EC 81 MG tablet Take 81 mg by mouth daily.  [name] atenolol (TENORMIN) 25 MG tablet TAKE 1 TABLET DAILY  . atorvastatin (LIPITOR) 40 MG tablet TAKE 1 TABLET DAILY  . cholecalciferol (VITAMIN D) 1000 UNITS tablet Take 2,000 Units by mouth daily.   . diphenhydrAMINE (BENADRYL) 25 MG tablet Take 50 mg by mouth every 6 (six) hours as needed.  . fluticasone (FLONASE) 50 MCG/ACT nasal spray Place 2 sprays into the nose daily as needed.  [name] glipiZIDE (GLUCOTROL) 5 MG tablet Take  1 tablet (5 mg total) by mouth 2 (two) times daily before a meal. (Patient taking differently: Take 10 mg by mouth 2 (two) times daily before a meal. )  . lisinopril (PRINIVIL,ZESTRIL) 20 MG tablet TAKE 1 TABLET DAILY  . metFORMIN (GLUCOPHAGE) 1000 MG tablet TAKE 1 TABLET TWICE DAILY WITH MEALS  . Multiple Vitamin (MULTIVITAMIN WITH MINERALS) TABS tablet Take 1 tablet by mouth daily.  . promethazine (PHENERGAN) 25 MG tablet TAKE 1 TABLET EVERY 4 HOURS AS NEEDED  . temazepam (RESTORIL) 15 MG capsule TAKE 1 CAPSULE BY MOUTH AT BEDTIME AS NEEDED  .  [DISCONTINUED] empagliflozin (JARDIANCE) 25 MG TABS tablet Take 25 mg by mouth daily.  . [DISCONTINUED] oxyCODONE-acetaminophen (PERCOCET) 5-325 MG tablet Take 1-2 tablets by mouth every 6 (six) hours as needed for severe pain. Post-operatively  . [DISCONTINUED] senna-docusate (SENOKOT-S) 8.6-50 MG tablet Take 1 tablet by mouth 2 (two) times daily. While taking strong pain meds to prevent constipation   No facility-administered encounter medications on file as of 08/02/2018.       Review of Systems  Constitutional: Negative.   HENT: Negative.   Eyes: Negative.   Respiratory: Negative.   Cardiovascular: Negative.   Gastrointestinal: Negative.   Endocrine: Negative.        Elevated BS  Genitourinary: Negative.   Musculoskeletal: Negative.   Skin: Negative.   Allergic/Immunologic: Negative.   Neurological: Negative.   Hematological: Negative.   Psychiatric/Behavioral: Negative.        Objective:   Physical Exam Vitals signs and nursing note reviewed.  Constitutional:      Appearance: Normal appearance. He is well-developed. He is obese. He is not ill-appearing.     Comments: The patient is pleasant as usual and has diminished hearing.  He seems to be really concerned about the ongoing episodes of gas and bloating and and sometimes very foul-smelling gas in addition to the lack of good blood sugar control and having to go off of his Jardiance.  HENT:     Head: Normocephalic and atraumatic.     Right Ear: Tympanic membrane, ear canal and external ear normal. There is no impacted cerumen.     Left Ear: Tympanic membrane, ear canal and external ear normal. There is no impacted cerumen.     Nose: Nose normal. No congestion.     Mouth/Throat:     Mouth: Mucous membranes are moist.     Pharynx: Oropharynx is clear. No oropharyngeal exudate.  Eyes:     General: No scleral icterus.       Right eye: No discharge.        Left eye: No discharge.     Extraocular Movements: Extraocular  movements intact.     Conjunctiva/sclera: Conjunctivae normal.     Pupils: Pupils are equal, round, and reactive to light.  Neck:     Musculoskeletal: Normal range of motion and neck supple.     Thyroid: No thyromegaly.     Vascular: Carotid bruit present.     Trachea: No tracheal deviation.     Comments: No thyromegaly or anterior cervical adenopathy.  There were carotid bruits noted today.  We will schedule him for carotid Dopplers. Cardiovascular:     Rate and Rhythm: Normal rate and regular rhythm.     Heart sounds: Normal heart sounds. No murmur.     Comments: The heart was regular today at 72/min with good pedal pulses and no edema Pulmonary:     Effort: Pulmonary effort is normal.  Breath sounds: Normal breath sounds. No wheezing or rales.     Comments: Lungs were clear anteriorly and posteriorly no axillary adenopathy Chest:     Chest wall: No tenderness.  Abdominal:     General: Bowel sounds are normal. There is distension.     Palpations: Abdomen is soft. There is no mass.     Tenderness: There is no abdominal tenderness.     Comments: Minimal distention with no liver or spleen enlargement masses or bruits and no inguinal adenopathy  Genitourinary:    Penis: Normal.      Scrotum/Testes: Normal.     Rectum: Normal.     Comments: State was slightly enlarged but smooth with no lumps or masses.  There were no rectal masses.  External genitalia were within normal limits and no inguinal hernias were palpated. Musculoskeletal: Normal range of motion.        General: No tenderness.     Right lower leg: No edema.     Left lower leg: No edema.  Lymphadenopathy:     Cervical: No cervical adenopathy.  Skin:    General: Skin is warm and dry.     Findings: No rash.  Neurological:     General: No focal deficit present.     Mental Status: He is alert and oriented to person, place, and time. Mental status is at baseline.     Cranial Nerves: No cranial nerve deficit.     Sensory:  No sensory deficit.     Deep Tendon Reflexes: Reflexes are normal and symmetric. Reflexes normal.  Psychiatric:        Mood and Affect: Mood normal.        Behavior: Behavior normal.        Thought Content: Thought content normal.        Judgment: Judgment normal.     Comments: Mood affect and behavior for this patient were all normal.    BP 129/61 (BP Location: Left Arm)   Pulse 60   Temp 98.5 F (36.9 C) (Oral)   Ht 5' 10" (1.778 m)   Wt 207 lb (93.9 kg)   BMI 29.70 kg/m         Assessment & Plan:  1. Uncontrolled type 2 diabetes mellitus without complication, without long-term current use of insulin ([place]) -We have not actually not had good control for this patient for a long time.  Since he is gone back off the [place] he has had some high readings close to 400.  He is currently taking glipizide 10 mg twice daily and metformin 1000 twice daily.  He is developed an intolerance to the Jardiance with rectal bleeding and nausea and vomiting. - Bayer DCA Hb A1c Waived  2. Annual physical exam -Have the patient go back and visit with the gastroenterologist because of these episodes of gas and sometimes foul-smelling gas and bloating. -Also scheduled for carotid Dopplers -Patient should follow-up with cardiology and ophthalmology as planned - BMP8+EGFR - CBC with Differential/Platelet - Lipid panel - VITAMIN D 25 Hydroxy (Vit-D Deficiency, Fractures) - Hepatic function panel - Bayer DCA Hb A1c Waived - PSA, total and free  3. Essential hypertension -The blood pressure is good today and he will continue with current treatment - BMP8+EGFR - Hepatic function panel  4. Vitamin D deficiency -Continue with vitamin D replacement - VITAMIN D 25 Hydroxy (Vit-D Deficiency, Fractures)  5. Testosterone deficiency -Continue with testosterone replacement - PSA, total and free  6. Hyperlipidemia associated with type  2 diabetes mellitus ([place]) -Continue with cholesterol treatment  of atorvastatin pending results of lab work - Lipid panel  7. Gastroesophageal reflux disease, esophagitis presence not specified -No complaints today with reflux. - Hepatic function panel  8. Benign prostatic hyperplasia, unspecified whether lower urinary tract symptoms present - PSA, total and free  9. Ascending aortic aneurysm [facility]) -Cardiology as planned - Lipid panel  10. Bilateral carotid bruits - US Carotid Duplex Bilateral; Future  Meds ordered this encounter  Medications  . glipiZIDE (GLUCOTROL) 5 MG tablet    Sig: Take 2 tablets (10 mg total) by mouth 2 (two) times daily before a meal.    Dispense:  360 tablet    Refill:  3   Patient Instructions  Continue current medications. Continue good therapeutic lifestyle changes which include good diet and exercise. Fall precautions discussed with patient. If an FOBT was given today- please return it to our front desk. If you are over 63 years old - you may need Prevnar 59 or the adult Pneumonia vaccine.  **Flu shots are available--- please call and schedule a FLU-CLINIC appointment**  After your visit with [place] today you will receive a survey in the mail or online from [employer] regarding your care with [place]. Please take a moment to fill this out. Your feedback is very important to [place] as you can help [place] better understand your patient needs as well as improve your experience and satisfaction. WE CARE ABOUT YOU!!!   We will schedule patient for a visit with the clinical pharmacist who is a diabetic educator and hopefully she can help him with adjusting his medicines to get the best control possible and not have the side effects he has been having with Jardiance. We will also schedule him for carotid Dopplers He will follow-up with Dr. Harl Bowie as planned on a yearly basis, his cardiologist. We will also schedule him for a follow-up visit with Dr. Laural Golden, the gastroenterologist to follow-up on the persistent gas and bloating episodes  that he has had.+++  Arrie Senate MD

## 2018-08-02 NOTE — Patient Instructions (Addendum)
Continue current medications. Continue good therapeutic lifestyle changes which include good diet and exercise. Fall precautions discussed with patient. If an FOBT was given today- please return it to our front desk. If you are over 64 years old - you may need Prevnar 13 or the adult Pneumonia vaccine.  **Flu shots are available--- please call and schedule a FLU-CLINIC appointment**  After your visit with us today you will receive a survey in the mail or online from [employer] regarding your care with us. Please take a moment to fill this out. Your feedback is very important to us as you can help us better understand your patient needs as well as improve your experience and satisfaction. WE CARE ABOUT YOU!!!   We will schedule patient for a visit with the clinical pharmacist who is a diabetic educator and hopefully she can help him with adjusting his medicines to get the best control possible and not have the side effects he has been having with Jardiance. We will also schedule him for carotid Dopplers He will follow-up with Dr. Wyline MoodBranch as planned on a yearly basis, his cardiologist. We will also schedule him for a follow-up visit with Dr. Karilyn Cotaehman, the gastroenterologist to follow-up on the persistent gas and bloating episodes that he has had.+++

## 2018-08-03 LAB — CBC WITH DIFFERENTIAL/PLATELET
BASOS ABS: 0.1 10*3/uL (ref 0.0–0.2)
Basos: 1 %
EOS (ABSOLUTE): 0.3 10*3/uL (ref 0.0–0.4)
Eos: 4 %
Hematocrit: 36.1 % — ABNORMAL LOW (ref 37.5–51.0)
Hemoglobin: 12.8 g/dL — ABNORMAL LOW (ref 13.0–17.7)
Immature Grans (Abs): 0 10*3/uL (ref 0.0–0.1)
Immature Granulocytes: 0 %
LYMPHS ABS: 2 10*3/uL (ref 0.7–3.1)
Lymphs: 29 %
MCH: 33.3 pg — ABNORMAL HIGH (ref 26.6–33.0)
MCHC: 35.5 g/dL (ref 31.5–35.7)
MCV: 94 fL (ref 79–97)
Monocytes Absolute: 0.5 10*3/uL (ref 0.1–0.9)
Monocytes: 8 %
Neutrophils Absolute: 4 10*3/uL (ref 1.4–7.0)
Neutrophils: 58 %
Platelets: 209 10*3/uL (ref 150–450)
RBC: 3.84 x10E6/uL — AB (ref 4.14–5.80)
RDW: 12.7 % (ref 11.6–15.4)
WBC: 6.8 10*3/uL (ref 3.4–10.8)

## 2018-08-03 LAB — HEPATIC FUNCTION PANEL
ALBUMIN: 4.6 g/dL (ref 3.8–4.8)
ALT: 61 IU/L — ABNORMAL HIGH (ref 0–44)
AST: 49 IU/L — AB (ref 0–40)
Alkaline Phosphatase: 79 IU/L (ref 39–117)
Bilirubin Total: 0.4 mg/dL (ref 0.0–1.2)
Bilirubin, Direct: 0.12 mg/dL (ref 0.00–0.40)
TOTAL PROTEIN: 7.1 g/dL (ref 6.0–8.5)

## 2018-08-03 LAB — BMP8+EGFR
BUN / CREAT RATIO: 17 (ref 10–24)
BUN: 21 mg/dL (ref 8–27)
CHLORIDE: 102 mmol/L (ref 96–106)
CO2: 20 mmol/L (ref 20–29)
Calcium: 9.6 mg/dL (ref 8.6–10.2)
Creatinine, Ser: 1.25 mg/dL (ref 0.76–1.27)
GFR calc Af Amer: 70 mL/min/{1.73_m2} (ref >59)
GFR calc non Af Amer: 61 mL/min/{1.73_m2} (ref >59)
GLUCOSE: 144 mg/dL — AB (ref 65–99)
POTASSIUM: 4.9 mmol/L (ref 3.5–5.2)
SODIUM: 140 mmol/L (ref 134–144)

## 2018-08-03 LAB — PSA, TOTAL AND FREE
PSA FREE PCT: 52.5 %
PSA, Free: 0.21 ng/mL
Prostate Specific Ag, Serum: 0.4 ng/mL (ref 0.0–4.0)

## 2018-08-03 LAB — LIPID PANEL
CHOL/HDL RATIO: 4 ratio (ref 0.0–5.0)
Cholesterol, Total: 111 mg/dL (ref 100–199)
HDL: 28 mg/dL — ABNORMAL LOW (ref >39)
LDL Calculated: 50 mg/dL (ref 0–99)
TRIGLYCERIDES: 166 mg/dL — AB (ref 0–149)
VLDL Cholesterol Cal: 33 mg/dL (ref 5–40)

## 2018-08-03 LAB — VITAMIN D 25 HYDROXY (VIT D DEFICIENCY, FRACTURES): Vit D, 25-Hydroxy: 31.8 ng/mL (ref 30.0–100.0)

## 2018-08-04 ENCOUNTER — Other Ambulatory Visit: Payer: Self-pay | Admitting: *Deleted

## 2018-08-04 DIAGNOSIS — R710 Precipitous drop in hematocrit: Secondary | ICD-10-CM

## 2018-08-04 DIAGNOSIS — E349 Endocrine disorder, unspecified: Secondary | ICD-10-CM

## 2018-08-05 ENCOUNTER — Other Ambulatory Visit: Payer: BLUE CROSS/BLUE SHIELD

## 2018-08-05 DIAGNOSIS — R710 Precipitous drop in hematocrit: Secondary | ICD-10-CM | POA: Diagnosis not present

## 2018-08-05 DIAGNOSIS — E349 Endocrine disorder, unspecified: Secondary | ICD-10-CM | POA: Diagnosis not present

## 2018-08-06 LAB — TESTOSTERONE,FREE AND TOTAL
Testosterone, Free: 7.9 pg/mL (ref 6.6–18.1)
Testosterone: 320 ng/dL (ref 264–916)

## 2018-08-08 LAB — FECAL OCCULT BLOOD, IMMUNOCHEMICAL: FECAL OCCULT BLD: NEGATIVE

## 2018-08-09 ENCOUNTER — Ambulatory Visit (INDEPENDENT_AMBULATORY_CARE_PROVIDER_SITE_OTHER): Payer: BLUE CROSS/BLUE SHIELD | Admitting: Internal Medicine

## 2018-08-10 ENCOUNTER — Ambulatory Visit (INDEPENDENT_AMBULATORY_CARE_PROVIDER_SITE_OTHER): Payer: BLUE CROSS/BLUE SHIELD | Admitting: Pharmacist Clinician (PhC)/ Clinical Pharmacy Specialist

## 2018-08-10 ENCOUNTER — Encounter: Payer: Self-pay | Admitting: Pharmacist Clinician (PhC)/ Clinical Pharmacy Specialist

## 2018-08-10 VITALS — BP 125/70 | HR 68

## 2018-08-10 DIAGNOSIS — E1165 Type 2 diabetes mellitus with hyperglycemia: Secondary | ICD-10-CM | POA: Diagnosis not present

## 2018-08-10 DIAGNOSIS — IMO0001 Reserved for inherently not codable concepts without codable children: Secondary | ICD-10-CM

## 2018-08-10 MED ORDER — SEMAGLUTIDE 3 MG PO TABS: 3.0000 mg | ORAL_TABLET | Freq: Every day | ORAL | 0 refills | Status: DC

## 2018-08-10 NOTE — Progress Notes (Addendum)
Patient is referred by Dr. Christell Constant for type 2 diabetes after recent increase in A1C.  He started back on Jardiance after his insurance approved it but had horrible diarrhea and vomiting with blood.  It lasted a week and then he stopped taking it an symptoms resolved quickly.  He had pancreatitis about a year and a half ago.  He broke his hand and had steroid shots last summer no other steroids.  Diet has greatly improved since our last visit.  Patient had pancreatitis and gall bladder removed in May 2018.  He had post ERCP Pancreatitis provoked by gall bladder surgery.  He had no precipitating causes and no problems since that time.  He was taking Jardiance, glipizide, and metformin at the time.  A/P:  1.  Type 2 Diabetes:  Poor control with consistently worsening A1Cs.  He is currently taking glipizide 5mg  and metformin 1gm bid.  He would like to avoid any injectable mediations.   Patient has tried Januvia and failed per patient due to adverse effects and recently failed retrial of Jardiance due to severe side effects.  He does not wish to pursue insulin due to fear of weight gain and taking an injection.  I discussed GLP-1 agonists with patient and specifically Rybelsus.  I counseled patient regarding the risk of pancreatitis <1% and that it is unknown if his history of pancreatitis increases his risk further with Rybelsus.  His previous pancreatitis was provoked and was post ERCP following gall bladder surgery.He understands this risk and wants to still proceed with taking it.  I counseled patient on signs of symptoms associated with pancreatitis. I also discussed prescribing this medication with Dr. Christell Constant.  There is no family or personal history of thyroid cancer but I counseled him on the black box warning.  Patient will start with Rybelsus 3mg  taken in the morning with 4oz of water and he is to wait 30 minutes before eating, drinking, or taking his other medications.  He is to eat 30-60 minutes after taking  Rybelsus.  I explained there would not be any therapeutic benefits at this dose but it is an adjustment dose and then we will increase after 30 days to 7mg  when he would then see benefits on lowering his glucose.  Patient is aware of GI adverse effects.  He is to call with any questions or concerns.  $10 card was given to patient today for his medication.  Total time with patient:  45 minutes Incident to Dr. Rudi Heap  Chari Manning, PharmD, CPP, CLS

## 2018-08-11 ENCOUNTER — Telehealth: Payer: Self-pay

## 2018-08-11 DIAGNOSIS — I10 Essential (primary) hypertension: Secondary | ICD-10-CM

## 2018-08-11 DIAGNOSIS — IMO0001 Reserved for inherently not codable concepts without codable children: Secondary | ICD-10-CM

## 2018-08-11 DIAGNOSIS — I712 Thoracic aortic aneurysm, without rupture: Secondary | ICD-10-CM

## 2018-08-11 DIAGNOSIS — E1169 Type 2 diabetes mellitus with other specified complication: Secondary | ICD-10-CM

## 2018-08-11 DIAGNOSIS — R0989 Other specified symptoms and signs involving the circulatory and respiratory systems: Secondary | ICD-10-CM

## 2018-08-11 DIAGNOSIS — E1165 Type 2 diabetes mellitus with hyperglycemia: Principal | ICD-10-CM

## 2018-08-11 DIAGNOSIS — I7121 Aneurysm of the ascending aorta, without rupture: Secondary | ICD-10-CM

## 2018-08-11 DIAGNOSIS — E785 Hyperlipidemia, unspecified: Secondary | ICD-10-CM

## 2018-08-11 NOTE — Telephone Encounter (Signed)
Reordered US

## 2018-08-11 NOTE — Telephone Encounter (Signed)
Most US's with [employer] don't need pre cert but this [employer] does and it went to peer to peer review  You need to call [phone] to get approval   His [facility] # is [id number]   CPT code 93790

## 2018-08-11 NOTE — Telephone Encounter (Signed)
Please have insurance know that we are concerned about abdominal pain and increased risk for pancreatitis with this medication that we have placed the patient on to get better blood sugar control

## 2018-08-12 ENCOUNTER — Ambulatory Visit (HOSPITAL_COMMUNITY): Payer: BLUE CROSS/BLUE SHIELD

## 2018-08-17 ENCOUNTER — Encounter: Payer: Self-pay | Admitting: Family Medicine

## 2018-08-17 ENCOUNTER — Ambulatory Visit: Payer: BLUE CROSS/BLUE SHIELD | Admitting: Pharmacist Clinician (PhC)/ Clinical Pharmacy Specialist

## 2018-08-18 ENCOUNTER — Ambulatory Visit (HOSPITAL_COMMUNITY): Payer: BLUE CROSS/BLUE SHIELD

## 2018-08-20 ENCOUNTER — Other Ambulatory Visit: Payer: Self-pay | Admitting: Family Medicine

## 2018-08-22 ENCOUNTER — Ambulatory Visit (INDEPENDENT_AMBULATORY_CARE_PROVIDER_SITE_OTHER): Payer: BLUE CROSS/BLUE SHIELD | Admitting: Internal Medicine

## 2018-08-22 ENCOUNTER — Encounter (INDEPENDENT_AMBULATORY_CARE_PROVIDER_SITE_OTHER): Payer: Self-pay | Admitting: Internal Medicine

## 2018-08-22 ENCOUNTER — Telehealth (INDEPENDENT_AMBULATORY_CARE_PROVIDER_SITE_OTHER): Payer: Self-pay | Admitting: *Deleted

## 2018-08-22 ENCOUNTER — Encounter (INDEPENDENT_AMBULATORY_CARE_PROVIDER_SITE_OTHER): Payer: Self-pay | Admitting: *Deleted

## 2018-08-22 VITALS — BP 132/78 | HR 99 | Temp 97.8°F | Ht 70.0 in | Wt 201.0 lb

## 2018-08-22 DIAGNOSIS — K625 Hemorrhage of anus and rectum: Secondary | ICD-10-CM

## 2018-08-22 DIAGNOSIS — R197 Diarrhea, unspecified: Secondary | ICD-10-CM | POA: Diagnosis not present

## 2018-08-22 LAB — CBC WITH DIFFERENTIAL/PLATELET
Absolute Monocytes: 891 cells/uL (ref 200–950)
Basophils Absolute: 88 cells/uL (ref 0–200)
Basophils Relative: 0.8 %
EOS PCT: 17.6 %
Eosinophils Absolute: 1936 cells/uL — ABNORMAL HIGH (ref 15–500)
HCT: 42.5 % (ref 38.5–50.0)
Hemoglobin: 14.9 g/dL (ref 13.2–17.1)
Lymphs Abs: 2057 cells/uL (ref 850–3900)
MCH: 33.4 pg — ABNORMAL HIGH (ref 27.0–33.0)
MCHC: 35.1 g/dL (ref 32.0–36.0)
MCV: 95.3 fL (ref 80.0–100.0)
MPV: 11.5 fL (ref 7.5–12.5)
Monocytes Relative: 8.1 %
NEUTROS ABS: 6028 {cells}/uL (ref 1500–7800)
NEUTROS PCT: 54.8 %
PLATELETS: 199 10*3/uL (ref 140–400)
RBC: 4.46 10*6/uL (ref 4.20–5.80)
RDW: 12.6 % (ref 11.0–15.0)
TOTAL LYMPHOCYTE: 18.7 %
WBC: 11 10*3/uL — ABNORMAL HIGH (ref 3.8–10.8)

## 2018-08-22 LAB — COMPREHENSIVE METABOLIC PANEL
AG Ratio: 1.5 (calc) (ref 1.0–2.5)
ALKALINE PHOSPHATASE (APISO): 115 U/L (ref 35–144)
ALT: 18 U/L (ref 9–46)
AST: 15 U/L (ref 10–35)
Albumin: 4.3 g/dL (ref 3.6–5.1)
BUN/Creatinine Ratio: 25 (calc) — ABNORMAL HIGH (ref 6–22)
BUN: 45 mg/dL — AB (ref 7–25)
CO2: 21 mmol/L (ref 20–32)
CREATININE: 1.79 mg/dL — AB (ref 0.70–1.25)
Calcium: 9.6 mg/dL (ref 8.6–10.3)
Chloride: 103 mmol/L (ref 98–110)
Globulin: 2.8 g/dL (calc) (ref 1.9–3.7)
Glucose, Bld: 282 mg/dL — ABNORMAL HIGH (ref 65–139)
Potassium: 4.8 mmol/L (ref 3.5–5.3)
Sodium: 137 mmol/L (ref 135–146)
Total Bilirubin: 0.5 mg/dL (ref 0.2–1.2)
Total Protein: 7.1 g/dL (ref 6.1–8.1)

## 2018-08-22 MED ORDER — SUPREP BOWEL PREP KIT 17.5-3.13-1.6 GM/177ML PO SOLN: 1.0000 | Freq: Once | ORAL | 0 refills | Status: AC

## 2018-08-22 NOTE — Progress Notes (Signed)
Subjective:    Patient ID: [name] [name], male    DOB: 1956/02/04, 63 y.o.   MRN: [id number]  HPIStates has been sick since Saturday afternoon. Has had diarrhea all weekend. Has a lot of gas and stomach pain. Has has been having multiple BMs since he became sick. He denies any fever. States his abdomen is not hurting today. States had some rectal bleeding about a month ago and then had some rectal bleeding yesterday.(small amt).  He saw the blood when he wiped. His appetite has not been good for the last couple of days. Before this, his appetite was okay. Does not feel like he has the flu.  He denies nausea. Is going to try to eat today. States he feels much better today.  States he has diarrhea about once a month.  Hx of diabetes x 10 yrs. (BS this am was 250) His last colonoscopy was in 2012 ( I cannot find path report).  No recent antibiotics.  Hx of CBD stone and underwent an ERCP in 2018.  No other GI complaints. Fecal occult blood at Dr. Kathi Der office in January were negative.   Patient became dizzy while vitals were being taken. Seated patient and water given to patient. States his blood sugar was over 250 this am.    Review of Systems Past Medical History:  Diagnosis Date  . Aortic stenosis 8/09   Bicuspid aortic valve  . Arthritis 6/10   hand   . Atrial fibrillation (HCC)    post op only  . Bile duct calculus with acute cholecystitis   . CAD (coronary artery disease) 8/09  . Calculus of common bile duct with acute pancreatitis 11/2016  . CTS (carpal tunnel syndrome)    bilateral   . Diastolic dysfunction 8/09  . Elevated LFTs   . Fatty liver disease, nonalcoholic   . History of kidney stones   . Hyperlipidemia   . Hypertension   . Kidney stones   . Nasal congestion   . NIDDM (non-insulin dependent diabetes mellitus)    x15 yrs  . OSA on CPAP 2006  . Paresthesias in left hand 02/19/2004  . Paresthesias in right hand 02/19/2004  . Sinusitis   . Vitamin D  deficiency 06/13/09    Past Surgical History:  Procedure Laterality Date  . bladder cancer - operation - 01/21/18 - Dr Berneice Heinrich  01/21/2018  . CARPAL TUNNEL RELEASE     bilateral   . CHOLECYSTECTOMY  11/2016  . CORONARY ANGIOPLASTY WITH STENT PLACEMENT     to rt coronary atery (Dr. Degert-cardiologist)  . CYSTOSCOPY WITH RETROGRADE PYELOGRAM, URETEROSCOPY AND STENT PLACEMENT Left 03/07/2018   Procedure: CYSTOSCOPY WITH RETROGRADE PYELOGRAM,  AND STENT PLACEMENT;  Surgeon: Sebastian Ache, MD;  Location: [facility];  Service: Urology;  Laterality: Left;  . ERCP N/A 11/24/2016   Procedure: ENDOSCOPIC RETROGRADE CHOLANGIOPANCREATOGRAPHY (ERCP);  Surgeon: Malissa Hippo, MD;  Location: [facility];  Service: Endoscopy;  Laterality: N/A;  . EXTRACORPOREAL SHOCK WAVE LITHOTRIPSY Left 03/17/2018   Procedure: LEFT EXTRACORPOREAL SHOCK WAVE LITHOTRIPSY (ESWL);  Surgeon: Bjorn Pippin, MD;  Location: [facility];  Service: Urology;  Laterality: Left;  . REMOVAL OF STONES N/A 11/24/2016   Procedure: REMOVAL OF STONES;  Surgeon: Malissa Hippo, MD;  Location: [facility];  Service: Endoscopy;  Laterality: N/A;  . SPHINCTEROTOMY N/A 11/24/2016   Procedure: SPHINCTEROTOMY;  Surgeon: Malissa Hippo, MD;  Location: [facility];  Service: Endoscopy;  Laterality: N/A;  . TISSUE  AORTIC VALVE REPLACEMENT     2012    Allergies  Allergen Reactions  . Glyxambi [Empagliflozin-Linagliptin] Diarrhea and Itching    He was able to take Jardiance alone with no side effects so it was the Tradjenta part that most likely gave him the reaction  . Hctz [Hydrochlorothiazide]     Nausea and headache   . Sulfa Antibiotics Nausea And Vomiting  . Sulfonamide Derivatives     Current Outpatient Medications on File Prior to Visit  Medication Sig Dispense Refill  . ANDROGEL PUMP 20.25 MG/ACT (1.62%) GEL APPLY THREE PUMPS AS DIRECTED - ON NEXT fill, run generic 11/23 ad 75 g 3  . aspirin EC 81 MG tablet Take 81 mg by mouth daily.     [name] atenolol (TENORMIN) 25 MG tablet TAKE 1 TABLET DAILY 90 tablet 3  . atorvastatin (LIPITOR) 40 MG tablet TAKE 1 TABLET DAILY 90 tablet 0  . cholecalciferol (VITAMIN D) 1000 UNITS tablet Take 2,000 Units by mouth daily.     . diphenhydrAMINE (BENADRYL) 25 MG tablet Take 50 mg by mouth every 6 (six) hours as needed.    . fluticasone (FLONASE) 50 MCG/ACT nasal spray Place 2 sprays into the nose daily as needed.    [name] glipiZIDE (GLUCOTROL) 5 MG tablet Take 2 tablets (10 mg total) by mouth 2 (two) times daily before a meal. 360 tablet 3  . lisinopril (PRINIVIL,ZESTRIL) 20 MG tablet TAKE 1 TABLET DAILY 90 tablet 0  . metFORMIN (GLUCOPHAGE) 1000 MG tablet TAKE 1 TABLET TWICE DAILY WITH MEALS 180 tablet 0  . Multiple Vitamin (MULTIVITAMIN WITH MINERALS) TABS tablet Take 1 tablet by mouth daily.    . promethazine (PHENERGAN) 25 MG tablet TAKE 1 TABLET EVERY 4 HOURS AS NEEDED 30 tablet 2  . Semaglutide (RYBELSUS) 3 MG TABS Take 3 mg by mouth daily before breakfast. Take 30 minutes before first food, beverage, or medication of the day.  Take with 4oz of water.  Eat within 30-60 minutes of taking the medication. 30 tablet 0  . temazepam (RESTORIL) 15 MG capsule TAKE 1 CAPSULE BY MOUTH AT BEDTIME AS NEEDED 90 capsule 0   No current facility-administered medications on file prior to visit.         Objective:   Physical Exam Blood pressure 132/78, pulse 99, temperature 97.8 F (36.6 C), height 5\' 10"  (1.778 m), weight 201 lb (91.2 kg). Alert and oriented. Skin warm and dry. Oral mucosa is moist.   . Sclera anicteric, conjunctivae is pink. Thyroid not enlarged. No cervical lymphadenopathy. Lungs clear. Heart regular rate and rhythm.  Abdomen is soft. Bowel sounds are positive. No hepatomegaly. No abdominal masses felt. No tenderness.  No edema to lower extremities.           Assessment & Plan:  Rectal bleeding. Colonic neoplasm needs to be ruled. Colonoscopy.  Rectal bleeding related to his diarrhea.  Am going to get a GI pathogen, CBC, and CMET.  GI pathogen.

## 2018-08-22 NOTE — Telephone Encounter (Signed)
Patient needs suprep 

## 2018-08-23 ENCOUNTER — Other Ambulatory Visit: Payer: Self-pay | Admitting: Family Medicine

## 2018-08-23 LAB — GASTROINTESTINAL PATHOGEN PANEL PCR
C. difficile Tox A/B, PCR: NOT DETECTED
CRYPTOSPORIDIUM, PCR: NOT DETECTED
Campylobacter, PCR: NOT DETECTED
E COLI (ETEC) LT/ST, PCR: NOT DETECTED
E COLI (STEC) STX1/STX2, PCR: NOT DETECTED
E COLI 0157, PCR: NOT DETECTED
Giardia lamblia, PCR: NOT DETECTED
NOROVIRUS, PCR: NOT DETECTED
Rotavirus A, PCR: NOT DETECTED
Salmonella, PCR: NOT DETECTED
Shigella, PCR: NOT DETECTED

## 2018-09-01 ENCOUNTER — Ambulatory Visit (HOSPITAL_COMMUNITY): Payer: BLUE CROSS/BLUE SHIELD

## 2018-09-07 ENCOUNTER — Ambulatory Visit (HOSPITAL_COMMUNITY)
Admission: RE | Admit: 2018-09-07 | Discharge: 2018-09-07 | Disposition: A | Discharge status: Home / Self Care | Payer: BLUE CROSS/BLUE SHIELD | Source: Ambulatory Visit | Attending: Family Medicine | Admitting: Family Medicine

## 2018-09-07 DIAGNOSIS — R0989 Other specified symptoms and signs involving the circulatory and respiratory systems: Secondary | ICD-10-CM | POA: Diagnosis not present

## 2018-09-07 DIAGNOSIS — I6523 Occlusion and stenosis of bilateral carotid arteries: Secondary | ICD-10-CM | POA: Diagnosis not present

## 2018-09-07 DIAGNOSIS — I10 Essential (primary) hypertension: Secondary | ICD-10-CM | POA: Diagnosis not present

## 2018-09-07 DIAGNOSIS — E785 Hyperlipidemia, unspecified: Secondary | ICD-10-CM | POA: Diagnosis not present

## 2018-09-07 DIAGNOSIS — I712 Thoracic aortic aneurysm, without rupture: Secondary | ICD-10-CM | POA: Insufficient documentation

## 2018-09-07 DIAGNOSIS — E1169 Type 2 diabetes mellitus with other specified complication: Secondary | ICD-10-CM | POA: Diagnosis not present

## 2018-09-07 DIAGNOSIS — E1165 Type 2 diabetes mellitus with hyperglycemia: Secondary | ICD-10-CM | POA: Diagnosis not present

## 2018-09-07 DIAGNOSIS — IMO0001 Reserved for inherently not codable concepts without codable children: Secondary | ICD-10-CM

## 2018-09-07 DIAGNOSIS — I7121 Aneurysm of the ascending aorta, without rupture: Secondary | ICD-10-CM

## 2018-09-13 ENCOUNTER — Ambulatory Visit (HOSPITAL_COMMUNITY): Payer: BLUE CROSS/BLUE SHIELD

## 2018-09-15 ENCOUNTER — Other Ambulatory Visit: Payer: Self-pay | Admitting: Family Medicine

## 2018-09-19 ENCOUNTER — Encounter: Payer: Self-pay | Admitting: Vascular Surgery

## 2018-09-19 ENCOUNTER — Other Ambulatory Visit: Payer: Self-pay

## 2018-09-19 ENCOUNTER — Ambulatory Visit (INDEPENDENT_AMBULATORY_CARE_PROVIDER_SITE_OTHER): Payer: BLUE CROSS/BLUE SHIELD | Admitting: Vascular Surgery

## 2018-09-19 ENCOUNTER — Encounter (INDEPENDENT_AMBULATORY_CARE_PROVIDER_SITE_OTHER): Payer: Self-pay

## 2018-09-19 VITALS — BP 152/80 | HR 80 | Temp 97.1°F | Resp 18 | Wt 200.0 lb

## 2018-09-19 DIAGNOSIS — I6521 Occlusion and stenosis of right carotid artery: Secondary | ICD-10-CM | POA: Diagnosis not present

## 2018-09-19 NOTE — Progress Notes (Signed)
Vascular and Vein Specialist of [facility] office  Patient name: [name] [name] MRN: [id number] DOB: 02-Jan-1956 Sex: male  REASON FOR CONSULT: Evaluation of asymptomatic carotid disease  HPI: [name] [name] is a 63 y.o. male, who is here today for discussion of duplex suggesting moderate to severe right carotid stenosis.  He was found to have a bruit on physical exam and underwent carotid duplex.  He specifically denies any prior history of a aphasia, amaurosis fugax, transient ischemic attack or stroke.  He reports that in the past he had a MRI suggesting old small strokes.  He does have a history of coronary bypass grafting and also aortic valve replacement 2012.  He has known a sending arch aneurysm and this is being followed with serial CT scans.  Past Medical History:  Diagnosis Date  . Aortic stenosis 8/09   Bicuspid aortic valve  . Arthritis 6/10   hand   . Atrial fibrillation (HCC)    post op only  . Bile duct calculus with acute cholecystitis   . CAD (coronary artery disease) 8/09  . Calculus of common bile duct with acute pancreatitis 11/2016  . CTS (carpal tunnel syndrome)    bilateral   . Diastolic dysfunction 8/09  . Elevated LFTs   . Fatty liver disease, nonalcoholic   . History of kidney stones   . Hyperlipidemia   . Hypertension   . Kidney stones   . Nasal congestion   . NIDDM (non-insulin dependent diabetes mellitus)    x15 yrs  . OSA on CPAP 2006  . Paresthesias in left hand 02/19/2004  . Paresthesias in right hand 02/19/2004  . Sinusitis   . Vitamin D deficiency 06/13/09    Family History  Problem Relation Age of Onset  . Hearing loss Mother   . Alzheimer's disease Mother   . Hearing loss Father   . Diabetes Father   . Diabetes Other        Family History     SOCIAL HISTORY: Social History   Socioeconomic History  . Marital status: Married    Spouse name: Not on file  . Number of children: Not on  file  . Years of education: Not on file  . Highest education level: Not on file  Occupational History  . Not on file  Social Needs  . Financial resource strain: Not on file  . Food insecurity:    Worry: Not on file    Inability: Not on file  . Transportation needs:    Medical: Not on file    Non-medical: Not on file  Tobacco Use  . Smoking status: Former Smoker    Packs/day: 1.50    Years: 25.00    Pack years: 37.50    Types: Cigarettes    Start date: 07/06/1974    Last attempt to quit: 07/06/2002    Years since quitting: 16.2  . Smokeless tobacco: Never Used  Substance and Sexual Activity  . Alcohol use: No    Alcohol/week: 0.0 standard drinks  . Drug use: No  . Sexual activity: Not on file  Lifestyle  . Physical activity:    Days per week: Not on file    Minutes per session: Not on file  . Stress: Not on file  Relationships  . Social connections:    Talks on phone: Not on file    Gets together: Not on file    Attends religious service: Not on file    Active member  of club or organization: Not on file    Attends meetings of clubs or organizations: Not on file    Relationship status: Not on file  . Intimate partner violence:    Fear of current or ex partner: Not on file    Emotionally abused: Not on file    Physically abused: Not on file    Forced sexual activity: Not on file  Other Topics Concern  . Not on file  Social History Narrative  . Not on file    Allergies  Allergen Reactions  . Glyxambi [Empagliflozin-Linagliptin] Diarrhea and Itching    He was able to take Jardiance alone with no side effects so it was the Tradjenta part that most likely gave him the reaction  . Hctz [Hydrochlorothiazide]     Nausea and headache   . Sulfa Antibiotics Nausea And Vomiting  . Sulfonamide Derivatives     Current Outpatient Medications  Medication Sig Dispense Refill  . ANDROGEL PUMP 20.25 MG/ACT (1.62%) GEL APPLY THREE PUMPS AS DIRECTED - ON NEXT fill, run generic  11/23 ad (Patient taking differently: Apply 3 Pump topically daily. ) 75 g 3  . aspirin EC 81 MG tablet Take 81 mg by mouth daily.    [name] atenolol (TENORMIN) 25 MG tablet TAKE 1 TABLET DAILY 90 tablet 3  . atorvastatin (LIPITOR) 40 MG tablet TAKE 1 TABLET DAILY 90 tablet 1  . cholecalciferol (VITAMIN D) 1000 UNITS tablet Take 2,000 Units by mouth daily.     . fexofenadine (ALLEGRA) 180 MG tablet Take 180 mg by mouth daily.    . fluticasone (FLONASE) 50 MCG/ACT nasal spray Place 2 sprays into the nose daily as needed for allergies.     [name] glipiZIDE (GLUCOTROL) 5 MG tablet Take 2 tablets (10 mg total) by mouth 2 (two) times daily before a meal. 360 tablet 3  . Icosapent Ethyl (VASCEPA) 1 g CAPS Take 1 g by mouth daily.    [name] lisinopril (PRINIVIL,ZESTRIL) 20 MG tablet TAKE 1 TABLET DAILY 90 tablet 4  . metFORMIN (GLUCOPHAGE) 1000 MG tablet TAKE 1 TABLET TWICE DAILY WITH MEALS (Patient taking differently: Take 1,000 mg by mouth 2 (two) times daily with a meal. ) 180 tablet 4  . promethazine (PHENERGAN) 25 MG tablet TAKE 1 TABLET EVERY 4 HOURS AS NEEDED (Patient taking differently: Take 25 mg by mouth every 4 (four) hours as needed for nausea or vomiting. ) 30 tablet 2  . RYBELSUS 3 MG TABS TAKE 1 TABLET BY MOUTH EVERY MORNING 30 MINUTES BEFORE FIRST FOOD, BEVERAGE, OR MEDICATION OF THE DAY. TAKE WITH FOUR OUNCES OF WATER. EAT WITHIN 30-60 MINUTES AFTER. (Patient taking differently: Take 3 mg by mouth every morning. ) 30 tablet 1  . Simethicone (GAS-X PO) Take 1 capsule by mouth daily.    . temazepam (RESTORIL) 15 MG capsule TAKE 1 CAPSULE BY MOUTH AT BEDTIME AS NEEDED (Patient taking differently: Take 15 mg by mouth at bedtime as needed for sleep. TAKE 1 CAPSULE BY MOUTH AT BEDTIME AS NEEDED) 90 capsule 0   No current facility-administered medications for this visit.     REVIEW OF SYSTEMS:  [X]  denotes positive finding, [ ]  denotes negative finding Cardiac  Comments:  Chest pain or chest pressure:     Shortness of breath upon exertion: x   Short of breath when lying flat:    Irregular heart rhythm:        Vascular    Pain in calf, thigh, or hip brought  on by ambulation:    Pain in feet at night that wakes you up from your sleep:     Blood clot in your veins:    Leg swelling:         Pulmonary    Oxygen at home:    Productive cough:  x   Wheezing:         Neurologic    Sudden weakness in arms or legs:     Sudden numbness in arms or legs:     Sudden onset of difficulty speaking or slurred speech:    Temporary loss of vision in one eye:     Problems with dizziness:  x       Gastrointestinal    Blood in stool:  x   Vomited blood:         Genitourinary    Burning when urinating:     Blood in urine:        Psychiatric    Major depression:         Hematologic    Bleeding problems:    Problems with blood clotting too easily:        Skin    Rashes or ulcers:        Constitutional    Fever or chills:      PHYSICAL EXAM: Vitals:   09/19/18 1028 09/19/18 1032  BP: (!) 148/76 (!) 152/80  Pulse: 76 80  Resp: 18   Temp: (!) 97.1 F (36.2 C)   TempSrc: Temporal   Weight: 200 lb (90.7 kg)     GENERAL: The patient is a well-nourished male, in no acute distress. The vital signs are documented above. CARDIOVASCULAR: I do not hear carotid bruits on physical exam.  He does have 2+ radial pulses bilaterally and 2+ dorsalis pedis pulses bilaterally PULMONARY: There is good air exchange  ABDOMEN: Soft and non-tender  MUSCULOSKELETAL: There are no major deformities or cyanosis. NEUROLOGIC: No focal weakness or paresthesias are detected. SKIN: There are no ulcers or rashes noted. PSYCHIATRIC: The patient has a normal affect.  DATA:  Carotid duplex at [facility] was reviewed.  This reveals right 50 to 69% internal carotid artery stenosis and no significant left internal carotid artery stenosis  I did review CT scan of his chest and abdomen.  This reveals 4 cm a  sending neck dilatation.  He has no evidence of abdominal aortic aneurysm.  Does have extensive calcification of his aorta iliac segments  MEDICAL ISSUES: Asymptomatic moderate to severe right carotid stenosis.  I described symptoms of carotid disease with him and he knows to present immediate to emergency room should this occur.  Otherwise we will see him in 1 year with repeat carotid duplex at [facility] for follow-up   Larina Earthly, MD [facility] Vascular and Vein Specialists of [facility] Tel [phone] Pager [phone]

## 2018-09-20 ENCOUNTER — Encounter: Payer: Self-pay | Admitting: Family Medicine

## 2018-09-22 ENCOUNTER — Ambulatory Visit (HOSPITAL_COMMUNITY)
Admission: RE | Admit: 2018-09-22 | Discharge: 2018-09-22 | Disposition: A | Discharge status: Home / Self Care | Payer: BLUE CROSS/BLUE SHIELD | Attending: Internal Medicine | Admitting: Internal Medicine

## 2018-09-22 ENCOUNTER — Encounter (HOSPITAL_COMMUNITY): Payer: Self-pay | Admitting: *Deleted

## 2018-09-22 ENCOUNTER — Other Ambulatory Visit: Payer: Self-pay

## 2018-09-22 ENCOUNTER — Ambulatory Visit (HOSPITAL_COMMUNITY)
Admission: RE | Disposition: A | Discharge status: Home / Self Care | Payer: Self-pay | Source: Home / Self Care | Attending: Internal Medicine

## 2018-09-22 DIAGNOSIS — R197 Diarrhea, unspecified: Secondary | ICD-10-CM | POA: Diagnosis not present

## 2018-09-22 DIAGNOSIS — I9589 Other hypotension: Secondary | ICD-10-CM | POA: Diagnosis not present

## 2018-09-22 DIAGNOSIS — Z7982 Long term (current) use of aspirin: Secondary | ICD-10-CM | POA: Diagnosis not present

## 2018-09-22 DIAGNOSIS — K625 Hemorrhage of anus and rectum: Secondary | ICD-10-CM | POA: Diagnosis not present

## 2018-09-22 DIAGNOSIS — Z953 Presence of xenogenic heart valve: Secondary | ICD-10-CM | POA: Insufficient documentation

## 2018-09-22 DIAGNOSIS — G4733 Obstructive sleep apnea (adult) (pediatric): Secondary | ICD-10-CM | POA: Insufficient documentation

## 2018-09-22 DIAGNOSIS — I2510 Atherosclerotic heart disease of native coronary artery without angina pectoris: Secondary | ICD-10-CM | POA: Insufficient documentation

## 2018-09-22 DIAGNOSIS — E119 Type 2 diabetes mellitus without complications: Secondary | ICD-10-CM | POA: Insufficient documentation

## 2018-09-22 DIAGNOSIS — E559 Vitamin D deficiency, unspecified: Secondary | ICD-10-CM | POA: Diagnosis not present

## 2018-09-22 DIAGNOSIS — Z87891 Personal history of nicotine dependence: Secondary | ICD-10-CM | POA: Insufficient documentation

## 2018-09-22 DIAGNOSIS — I10 Essential (primary) hypertension: Secondary | ICD-10-CM | POA: Insufficient documentation

## 2018-09-22 DIAGNOSIS — Z882 Allergy status to sulfonamides status: Secondary | ICD-10-CM | POA: Insufficient documentation

## 2018-09-22 DIAGNOSIS — Z888 Allergy status to other drugs, medicaments and biological substances status: Secondary | ICD-10-CM | POA: Insufficient documentation

## 2018-09-22 DIAGNOSIS — Z8551 Personal history of malignant neoplasm of bladder: Secondary | ICD-10-CM | POA: Diagnosis not present

## 2018-09-22 DIAGNOSIS — Z7984 Long term (current) use of oral hypoglycemic drugs: Secondary | ICD-10-CM | POA: Diagnosis not present

## 2018-09-22 DIAGNOSIS — E785 Hyperlipidemia, unspecified: Secondary | ICD-10-CM | POA: Diagnosis not present

## 2018-09-22 DIAGNOSIS — M19049 Primary osteoarthritis, unspecified hand: Secondary | ICD-10-CM | POA: Insufficient documentation

## 2018-09-22 DIAGNOSIS — K644 Residual hemorrhoidal skin tags: Secondary | ICD-10-CM | POA: Insufficient documentation

## 2018-09-22 DIAGNOSIS — K648 Other hemorrhoids: Secondary | ICD-10-CM | POA: Insufficient documentation

## 2018-09-22 DIAGNOSIS — Z79899 Other long term (current) drug therapy: Secondary | ICD-10-CM | POA: Diagnosis not present

## 2018-09-22 DIAGNOSIS — K5730 Diverticulosis of large intestine without perforation or abscess without bleeding: Secondary | ICD-10-CM | POA: Diagnosis not present

## 2018-09-22 DIAGNOSIS — Z8719 Personal history of other diseases of the digestive system: Secondary | ICD-10-CM | POA: Diagnosis not present

## 2018-09-22 HISTORY — PX: COLONOSCOPY: SHX5424

## 2018-09-22 LAB — GLUCOSE, CAPILLARY
Glucose-Capillary: 152 mg/dL — ABNORMAL HIGH (ref 70–99)
Glucose-Capillary: 140 mg/dL — ABNORMAL HIGH (ref 70–99)

## 2018-09-22 SURGERY — COLONOSCOPY
Anesthesia: Moderate Sedation

## 2018-09-22 MED ORDER — ATROPINE SULFATE 1 MG/ML IJ SOLN
INTRAMUSCULAR | Status: DC | PRN
  Administered 2018-09-22 (×2): .5 mg via INTRAVENOUS

## 2018-09-22 MED ORDER — METRONIDAZOLE 250 MG PO TABS: 250.0000 mg | ORAL_TABLET | Freq: Three times a day (TID) | ORAL | 0 refills | Status: DC

## 2018-09-22 MED ORDER — MEPERIDINE HCL 50 MG/ML IJ SOLN
INTRAMUSCULAR | Status: AC
  Filled 2018-09-22: qty 1

## 2018-09-22 MED ORDER — MIDAZOLAM HCL 5 MG/5ML IJ SOLN
INTRAMUSCULAR | Status: DC | PRN
  Administered 2018-09-22 (×2): 2 mg via INTRAVENOUS
  Administered 2018-09-22: 1 mg via INTRAVENOUS

## 2018-09-22 MED ORDER — SODIUM CHLORIDE 0.9 % IV SOLN
INTRAVENOUS | Status: DC
  Administered 2018-09-22: 13:00:00 via INTRAVENOUS

## 2018-09-22 MED ORDER — MEPERIDINE HCL 50 MG/ML IJ SOLN
INTRAMUSCULAR | Status: DC | PRN
  Administered 2018-09-22: 25 mg

## 2018-09-22 MED ORDER — NALOXONE HCL 0.4 MG/ML IJ SOLN
INTRAMUSCULAR | Status: AC
  Filled 2018-09-22: qty 1

## 2018-09-22 MED ORDER — NALOXONE HCL 0.4 MG/ML IJ SOLN
INTRAMUSCULAR | Status: DC | PRN
  Administered 2018-09-22 (×2): .2 mg via INTRAVENOUS

## 2018-09-22 MED ORDER — MIDAZOLAM HCL 5 MG/5ML IJ SOLN
INTRAMUSCULAR | Status: AC
  Filled 2018-09-22: qty 10

## 2018-09-22 MED ORDER — ATROPINE SULFATE 1 MG/ML IJ SOLN
INTRAMUSCULAR | Status: AC
  Filled 2018-09-22: qty 1

## 2018-09-22 NOTE — H&P (Signed)
[name] [name] is an 63 y.o. male.   Chief Complaint: Patient is here for colonoscopy. HPI: Patient is 63 year old Caucasian male with multiple medical problems including diabetes mellitus who experienced diarrhea and rectal bleeding which lasted for 10 days.  GI pathogen panel was negative.  Rectal bleeding stopped but he remains with intermittent diarrhea with bloating and excessive flatus.  He says the symptoms been going on off and on for couple of years. Colonoscopy was in November 2012 with removal of single polyp by Dr. Teena Dunk.  He was advised to come back for follow-up exam in 10 years. Family history is negative for IBD or CRC.  Past Medical History:  Diagnosis Date  . Aortic stenosis 8/09   Bicuspid aortic valve  . Arthritis 6/10   hand   . Atrial fibrillation (HCC)    post op only  . Bile duct calculus with acute cholecystitis   . CAD (coronary artery disease) 8/09  . Calculus of common bile duct with acute pancreatitis 11/2016  . CTS (carpal tunnel syndrome)    bilateral   . Diastolic dysfunction 8/09  . Elevated LFTs   . Fatty liver disease, nonalcoholic   . History of kidney stones   . Hyperlipidemia   . Hypertension   . Kidney stones   . Nasal congestion   . NIDDM (non-insulin dependent diabetes mellitus)    x15 yrs  . OSA on CPAP 2006  . Paresthesias in left hand 02/19/2004  . Paresthesias in right hand 02/19/2004  . Sinusitis   . Vitamin D deficiency 06/13/09    Past Surgical History:  Procedure Laterality Date  . bladder cancer - operation - 01/21/18 - Dr Berneice Heinrich  01/21/2018  . CARPAL TUNNEL RELEASE     bilateral   . CHOLECYSTECTOMY  11/2016  . CORONARY ANGIOPLASTY WITH STENT PLACEMENT     to rt coronary atery (Dr. Degert-cardiologist)  . CYSTOSCOPY WITH RETROGRADE PYELOGRAM, URETEROSCOPY AND STENT PLACEMENT Left 03/07/2018   Procedure: CYSTOSCOPY WITH RETROGRADE PYELOGRAM,  AND STENT PLACEMENT;  Surgeon: Sebastian Ache, MD;  Location: [facility];  Service: Urology;   Laterality: Left;  . ERCP N/A 11/24/2016   Procedure: ENDOSCOPIC RETROGRADE CHOLANGIOPANCREATOGRAPHY (ERCP);  Surgeon: Malissa Hippo, MD;  Location: [facility];  Service: Endoscopy;  Laterality: N/A;  . EXTRACORPOREAL SHOCK WAVE LITHOTRIPSY Left 03/17/2018   Procedure: LEFT EXTRACORPOREAL SHOCK WAVE LITHOTRIPSY (ESWL);  Surgeon: Bjorn Pippin, MD;  Location: [facility];  Service: Urology;  Laterality: Left;  . REMOVAL OF STONES N/A 11/24/2016   Procedure: REMOVAL OF STONES;  Surgeon: Malissa Hippo, MD;  Location: [facility];  Service: Endoscopy;  Laterality: N/A;  . SPHINCTEROTOMY N/A 11/24/2016   Procedure: SPHINCTEROTOMY;  Surgeon: Malissa Hippo, MD;  Location: [facility];  Service: Endoscopy;  Laterality: N/A;  . TISSUE AORTIC VALVE REPLACEMENT     2012    Family History  Problem Relation Age of Onset  . Hearing loss Mother   . Alzheimer's disease Mother   . Hearing loss Father   . Diabetes Father   . Diabetes Other        Family History    Social History:  reports that he quit smoking about 16 years ago. His smoking use included cigarettes. He started smoking about 44 years ago. He has a 37.50 pack-year smoking history. He has never used smokeless tobacco. He reports that he does not drink alcohol or use drugs.  Allergies:  Allergies  Allergen Reactions  . Glyxambi [  Empagliflozin-Linagliptin] Diarrhea and Itching    He was able to take Jardiance alone with no side effects so it was the Tradjenta part that most likely gave him the reaction  . Hctz [Hydrochlorothiazide]     Nausea and headache   . Sulfa Antibiotics Nausea And Vomiting  . Sulfonamide Derivatives     Medications Prior to Admission  Medication Sig Dispense Refill  . ANDROGEL PUMP 20.25 MG/ACT (1.62%) GEL APPLY THREE PUMPS AS DIRECTED - ON NEXT fill, run generic 11/23 ad (Patient taking differently: Apply 3 Pump topically daily. ) 75 g 3  . aspirin EC 81 MG tablet Take 81 mg by mouth daily.    [name] atenolol  (TENORMIN) 25 MG tablet TAKE 1 TABLET DAILY 90 tablet 3  . atorvastatin (LIPITOR) 40 MG tablet TAKE 1 TABLET DAILY 90 tablet 1  . cholecalciferol (VITAMIN D) 1000 UNITS tablet Take 2,000 Units by mouth daily.     . fexofenadine (ALLEGRA) 180 MG tablet Take 180 mg by mouth daily.    . fluticasone (FLONASE) 50 MCG/ACT nasal spray Place 2 sprays into the nose daily as needed for allergies.     [name] glipiZIDE (GLUCOTROL) 5 MG tablet Take 2 tablets (10 mg total) by mouth 2 (two) times daily before a meal. 360 tablet 3  . Icosapent Ethyl (VASCEPA) 1 g CAPS Take 1 g by mouth daily.    [name] lisinopril (PRINIVIL,ZESTRIL) 20 MG tablet TAKE 1 TABLET DAILY 90 tablet 4  . metFORMIN (GLUCOPHAGE) 1000 MG tablet TAKE 1 TABLET TWICE DAILY WITH MEALS (Patient taking differently: Take 1,000 mg by mouth 2 (two) times daily with a meal. ) 180 tablet 4  . promethazine (PHENERGAN) 25 MG tablet TAKE 1 TABLET EVERY 4 HOURS AS NEEDED (Patient taking differently: Take 25 mg by mouth every 4 (four) hours as needed for nausea or vomiting. ) 30 tablet 2  . RYBELSUS 3 MG TABS TAKE 1 TABLET BY MOUTH EVERY MORNING 30 MINUTES BEFORE FIRST FOOD, BEVERAGE, OR MEDICATION OF THE DAY. TAKE WITH FOUR OUNCES OF WATER. EAT WITHIN 30-60 MINUTES AFTER. (Patient taking differently: Take 3 mg by mouth every morning. ) 30 tablet 1  . Simethicone (GAS-X PO) Take 1 capsule by mouth daily.    . temazepam (RESTORIL) 15 MG capsule TAKE 1 CAPSULE BY MOUTH AT BEDTIME AS NEEDED (Patient taking differently: Take 15 mg by mouth at bedtime as needed for sleep. TAKE 1 CAPSULE BY MOUTH AT BEDTIME AS NEEDED) 90 capsule 0    Results for orders placed or performed during the hospital encounter of 09/22/18 (from the past 48 hour(s))  Glucose, capillary     Status: Abnormal   Collection Time: 09/22/18 12:40 PM  Result Value Ref Range   Glucose-Capillary 152 (H) 70 - 99 mg/dL   No results found.  ROS  Blood pressure (!) 103/55, pulse (!) 55, temperature 97.6 F  (36.4 C), temperature source Oral, resp. rate 20, SpO2 97 %. Physical Exam  Constitutional: He appears well-developed and well-nourished.  HENT:  Mouth/Throat: Oropharynx is clear and moist.  Eyes: Conjunctivae are normal. No scleral icterus.  Neck: No thyromegaly present.  Cardiovascular: Normal rate, regular rhythm and normal heart sounds.  No murmur heard. Respiratory: Effort normal and breath sounds normal.  Midsternal scar.  GI:  Abdomen is symmetrical.  Periumbilical scar noted. Soft and nontender with organomegaly or masses.  Musculoskeletal:        General: No edema.  Lymphadenopathy:    He has no cervical  adenopathy.  Neurological: He is alert.  Skin: Skin is warm and dry.     Assessment/Plan History of rectal bleeding. Diagnostic colonoscopy.   Lionel DecemberNajeeb Liliyana Thobe, MD 09/22/2018, 1:08 PM

## 2018-09-22 NOTE — Discharge Instructions (Signed)
Resume usual medications including aspirin as before. Modified carb high-fiber diet. No driving for 24 hours. Use metronidazole when he have next episode of flatulence diarrhea and passing flatus. Physician will call with results of blood test.   PATIENT INSTRUCTIONS POST-ANESTHESIA  IMMEDIATELY FOLLOWING SURGERY:  Do not drive or operate machinery for the first twenty four hours after surgery.  Do not make any important decisions for twenty four hours after surgery or while taking narcotic pain medications or sedatives.  If you develop intractable nausea and vomiting or a severe headache please notify your doctor immediately.  FOLLOW-UP:  Please make an appointment with your surgeon as instructed. You do not need to follow up with anesthesia unless specifically instructed to do so.  WOUND CARE INSTRUCTIONS (if applicable):  Keep a dry clean dressing on the anesthesia/puncture wound site if there is drainage.  Once the wound has quit draining you may leave it open to air.  Generally you should leave the bandage intact for twenty four hours unless there is drainage.  If the epidural site drains for more than 36-48 hours please call the anesthesia department.  QUESTIONS?:  Please feel free to call your physician or the hospital operator if you have any questions, and they will be happy to assist you.      Colonoscopy, Adult, Care After This sheet gives you information about how to care for yourself after your procedure. Your doctor may also give you more specific instructions. If you have problems or questions, call your doctor. What can I expect after the procedure? After the procedure, it is common to have:  A small amount of blood in your poop for 24 hours.  Some gas.  Mild cramping or bloating in your belly. Follow these instructions at home: General instructions  For the first 24 hours after the procedure: ? Do not drive or use machinery. ? Do not sign important documents. ? Do  not drink alcohol. ? Do your daily activities more slowly than normal. ? Eat foods that are soft and easy to digest.  Take over-the-counter or prescription medicines only as told by your doctor. To help cramping and bloating:   Try walking around.  Put heat on your belly (abdomen) as told by your doctor. Use a heat source that your doctor recommends, such as a moist heat pack or a heating pad. ? Put a towel between your skin and the heat source. ? Leave the heat on for 20-30 minutes. ? Remove the heat if your skin turns bright red. This is especially important if you cannot feel pain, heat, or cold. You can get burned. Eating and drinking   Drink enough fluid to keep your pee (urine) clear or pale yellow.  Return to your normal diet as told by your doctor. Avoid heavy or fried foods that are hard to digest.  Avoid drinking alcohol for as long as told by your doctor. Contact a doctor if:  You have blood in your poop (stool) 2-3 days after the procedure. Get help right away if:  You have more than a small amount of blood in your poop.  You see large clumps of tissue (blood clots) in your poop.  Your belly is swollen.  You feel sick to your stomach (nauseous).  You throw up (vomit).  You have a fever.  You have belly pain that gets worse, and medicine does not help your pain. Summary  After the procedure, it is common to have a small amount of blood  in your poop. You may also have mild cramping and bloating in your belly.  For the first 24 hours after the procedure, do not drive or use machinery, do not sign important documents, and do not drink alcohol.  Get help right away if you have a lot of blood in your poop, feel sick to your stomach, have a fever, or have more belly pain. This information is not intended to replace advice given to you by your health care provider. Make sure you discuss any questions you have with your health care provider. Document Released:  07/25/2010 Document Revised: 04/22/2017 Document Reviewed: 03/16/2016 Elsevier Interactive Patient Education  2019 [employer].    Diverticulosis  Diverticulosis is a condition that develops when small pouches (diverticula) form in the wall of the large intestine (colon). The colon is where water is absorbed and stool is formed. The pouches form when the inside layer of the colon pushes through weak spots in the outer layers of the colon. You may have a few pouches or many of them. What are the causes? The cause of this condition is not known. What increases the risk? The following factors may make you more likely to develop this condition:  Being older than age 71. Your risk for this condition increases with age. Diverticulosis is rare among people younger than age 19. By age 51, many people have it.  Eating a low-fiber diet.  Having frequent constipation.  Being overweight.  Not getting enough exercise.  Smoking.  Taking over-the-counter pain medicines, like aspirin and ibuprofen.  Having a family history of diverticulosis. What are the signs or symptoms? In most people, there are no symptoms of this condition. If you do have symptoms, they may include:  Bloating.  Cramps in the abdomen.  Constipation or diarrhea.  Pain in the lower left side of the abdomen. How is this diagnosed? This condition is most often diagnosed during an exam for other colon problems. Because diverticulosis usually has no symptoms, it often cannot be diagnosed independently. This condition may be diagnosed by:  Using a flexible scope to examine the colon (colonoscopy).  Taking an X-ray of the colon after dye has been put into the colon (barium enema).  Doing a CT scan. How is this treated? You may not need treatment for this condition if you have never developed an infection related to diverticulosis. If you have had an infection before, treatment may include:  Eating a high-fiber diet.  This may include eating more fruits, vegetables, and grains.  Taking a fiber supplement.  Taking a live bacteria supplement (probiotic).  Taking medicine to relax your colon.  Taking antibiotic medicines. Follow these instructions at home:  Drink 6-8 glasses of water or more each day to prevent constipation.  Try not to strain when you have a bowel movement.  If you have had an infection before: ? Eat more fiber as directed by your health care provider or your diet and nutrition specialist (dietitian). ? Take a fiber supplement or probiotic, if your health care provider approves.  Take over-the-counter and prescription medicines only as told by your health care provider.  If you were prescribed an antibiotic, take it as told by your health care provider. Do not stop taking the antibiotic even if you start to feel better.  Keep all follow-up visits as told by your health care provider. This is important. Contact a health care provider if:  You have pain in your abdomen.  You have bloating.  You  have cramps.  You have not had a bowel movement in 3 days. Get help right away if:  Your pain gets worse.  Your bloating becomes very bad.  You have a fever or chills, and your symptoms suddenly get worse.  You vomit.  You have bowel movements that are bloody or black.  You have bleeding from your rectum. Summary  Diverticulosis is a condition that develops when small pouches (diverticula) form in the wall of the large intestine (colon).  You may have a few pouches or many of them.  This condition is most often diagnosed during an exam for other colon problems.  If you have had an infection related to diverticulosis, treatment may include increasing the fiber in your diet, taking supplements, or taking medicines. This information is not intended to replace advice given to you by your health care provider. Make sure you discuss any questions you have with your health care  provider. Document Released: 03/19/2004 Document Revised: 05/11/2016 Document Reviewed: 05/11/2016 Elsevier Interactive Patient Education  2019 [employer]     Hemorrhoids Hemorrhoids are swollen veins that may develop:  In the butt (rectum). These are called internal hemorrhoids.  Around the opening of the butt (anus). These are called external hemorrhoids. Hemorrhoids can cause pain, itching, or bleeding. Most of the time, they do not cause serious problems. They usually get better with diet changes, lifestyle changes, and other home treatments. What are the causes? This condition may be caused by:  Having trouble pooping (constipation).  Pushing hard (straining) to poop.  Watery poop (diarrhea).  Pregnancy.  Being very overweight (obese).  Sitting for long periods of time.  Heavy lifting or other activity that causes you to strain.  Anal sex.  Riding a bike for a long period of time. What are the signs or symptoms? Symptoms of this condition include:  Pain.  Itching or soreness in the butt.  Bleeding from the butt.  Leaking poop.  Swelling in the area.  One or more lumps around the opening of your butt. How is this diagnosed? A doctor can often diagnose this condition by looking at the affected area. The doctor may also:  Do an exam that involves feeling the area with a gloved hand (digital rectal exam).  Examine the area inside your butt using a small tube (anoscope).  Order blood tests. This may be done if you have lost a lot of blood.  Have you get a test that involves looking inside the colon using a flexible tube with a camera on the end (sigmoidoscopy or colonoscopy). How is this treated? This condition can usually be treated at home. Your doctor may tell you to change what you eat, make lifestyle changes, or try home treatments. If these do not help, procedures can be done to remove the hemorrhoids or make them smaller. These may  involve:  Placing rubber bands at the base of the hemorrhoids to cut off their blood supply.  Injecting medicine into the hemorrhoids to shrink them.  Shining a type of light energy onto the hemorrhoids to cause them to fall off.  Doing surgery to remove the hemorrhoids or cut off their blood supply. Follow these instructions at home: Eating and drinking   Eat foods that have a lot of fiber in them. These include whole grains, beans, nuts, fruits, and vegetables.  Ask your doctor about taking products that have added fiber (fibersupplements).  Reduce the amount of fat in your diet. You can do this by: ?  Eating low-fat dairy products. ? Eating less red meat. ? Avoiding processed foods.  Drink enough fluid to keep your pee (urine) pale yellow. Managing pain and swelling   Take a warm-water bath (sitz bath) for 20 minutes to ease pain. Do this 3-4 times a day. You may do this in a bathtub or using a portable sitz bath that fits over the toilet.  If told, put ice on the painful area. It may be helpful to use ice between your warm baths. ? Put ice in a plastic bag. ? Place a towel between your skin and the bag. ? Leave the ice on for 20 minutes, 2-3 times a day. General instructions  Take over-the-counter and prescription medicines only as told by your doctor. ? Medicated creams and medicines may be used as told.  Exercise often. Ask your doctor how much and what kind of exercise is best for you.  Go to the bathroom when you have the urge to poop. Do not wait.  Avoid pushing too hard when you poop.  Keep your butt dry and clean. Use wet toilet paper or moist towelettes after pooping.  Do not sit on the toilet for a long time.  Keep all follow-up visits as told by your doctor. This is important. Contact a doctor if you:  Have pain and swelling that do not get better with treatment or medicine.  Have trouble pooping.  Cannot poop.  Have pain or swelling outside the  area of the hemorrhoids. Get help right away if you have:  Bleeding that will not stop. Summary  Hemorrhoids are swollen veins in the butt or around the opening of the butt.  They can cause pain, itching, or bleeding.  Eat foods that have a lot of fiber in them. These include whole grains, beans, nuts, fruits, and vegetables.  Take a warm-water bath (sitz bath) for 20 minutes to ease pain. Do this 3-4 times a day. This information is not intended to replace advice given to you by your health care provider. Make sure you discuss any questions you have with your health care provider. Document Released: 03/31/2008 Document Revised: 11/11/2017 Document Reviewed: 11/11/2017 Elsevier Interactive Patient Education  2019 [employer].

## 2018-09-22 NOTE — Op Note (Signed)
[facility] Patient Name: [name] [name] Procedure Date: 09/22/2018 12:10 PM MRN: [id number] Date of Birth: 1956/05/08 Attending MD: Lionel December , MD CSN: [id number] Age: 63 Admit Type: Outpatient Procedure:                Colonoscopy Indications:              Rectal bleeding and history of diarrhea Providers:                Lionel December, MD, Loma Messing B. Patsy Lager, RN, Burke Keels, Technician Referring MD:             Ernestina Penna, MD Medicines:                Meperidine 25 mg IV, Midazolam 5 mg IV, Atropine 1                            mg IV and Narcan 0.4 mg IV Complications:            Hypotension treated with IV fluids, atropine and                            narcan. Estimated Blood Loss:     Estimated blood loss: none. Procedure:                Pre-Anesthesia Assessment:                           - Prior to the procedure, a History and Physical                            was performed, and patient medications and                            allergies were reviewed. The patient's tolerance of                            previous anesthesia was also reviewed. The risks                            and benefits of the procedure and the sedation                            options and risks were discussed with the patient.                            All questions were answered, and informed consent                            was obtained. Prior Anticoagulants: The patient                            last took aspirin 2 days prior to the procedure.  ASA Grade Assessment: III - A patient with severe                            systemic disease. After reviewing the risks and                            benefits, the patient was deemed in satisfactory                            condition to undergo the procedure.                           After obtaining informed consent, the colonoscope                            was passed under  direct vision. Throughout the                            procedure, the patient's blood pressure, pulse, and                            oxygen saturations were monitored continuously. The                            [id number]-[id number] ([id number]) scope was introduced through                            the anus and advanced to the the cecum, identified                            by appendiceal orifice and ileocecal valve. The                            colonoscopy was technically difficult and complex                            due to restricted mobility of the colon. Successful                            completion of the procedure was aided by                            withdrawing the scope and replacing with the                            'babyscope'. The patient tolerated the procedure                            well. The quality of the bowel preparation was                            good. The ileocecal valve, appendiceal orifice, and  rectum were photographed. Scope In: 1:48:48 PM Scope Out: 2:03:54 PM Scope Withdrawal Time: 0 hours 5 minutes 8 seconds  Total Procedure Duration: 0 hours 15 minutes 6 seconds  Findings:      The perianal and digital rectal examinations were normal.      Scattered medium-mouthed diverticula were found in the sigmoid colon and       hepatic flexure.      External and internal hemorrhoids were found during retroflexion. The       hemorrhoids were small. Impression:               - Diverticulosis in the sigmoid colon and at the                            hepatic flexure.                           - External and internal hemorrhoids.                           - No specimens collected. Moderate Sedation:      Moderate (conscious) sedation was administered by the endoscopy nurse       and supervised by the endoscopist. The following parameters were       monitored: oxygen saturation, heart rate, blood pressure, CO2       capnography and  response to care. Total physician intraservice time was       48 minutes. Recommendation:           - Patient has a contact number available for                            emergencies. The signs and symptoms of potential                            delayed complications were discussed with the                            patient. Return to normal activities tomorrow.                            Written discharge instructions were provided to the                            patient.                           - High fiber diet and diabetic (ADA) diet today.                           - Continue present medications.                           - Resume aspirin at prior dose today.                           - Check celiac antibody panel today.                           -  Repeat colonoscopy in 10 years for screening                            purposes.                           Post procedure ECG did not reveal acute                            abnormalities. Procedure Code(s):        --- Professional ---                           [phone], Colonoscopy, flexible; diagnostic, including                            collection of specimen(s) by brushing or washing,                            when performed (separate procedure)                           99153, Moderate sedation; each additional 15                            minutes intraservice time                           99153, Moderate sedation; each additional 15                            minutes intraservice time                           G0500, Moderate sedation services provided by the                            same physician or other qualified health care                            professional performing a gastrointestinal                            endoscopic service that sedation supports,                            requiring the presence of an independent trained                            observer to assist in the monitoring of the                             patient's level of consciousness and physiological                            status; initial 15 minutes of intra-service time;  patient age 5 years or older (additional time may                            be reported with 16109, as appropriate) Diagnosis Code(s):        --- Professional ---                           K64.8, Other hemorrhoids                           K62.5, Hemorrhage of anus and rectum                           K57.30, Diverticulosis of large intestine without                            perforation or abscess without bleeding CPT copyright 2018 [employer]. All rights reserved. The codes documented in this report are preliminary and upon coder review may  be revised to meet current compliance requirements. Lionel December, MD Lionel December, MD 09/22/2018 2:20:25 PM This report has been signed electronically. Number of Addenda: 0

## 2018-09-24 LAB — CELIAC DISEASE PANEL
Endomysial Ab, IgA: NEGATIVE
IgA: 161 mg/dL (ref 61–437)

## 2018-09-26 ENCOUNTER — Encounter (HOSPITAL_COMMUNITY): Payer: Self-pay | Admitting: Internal Medicine

## 2018-10-25 ENCOUNTER — Telehealth: Payer: Self-pay | Admitting: Cardiology

## 2018-10-25 NOTE — Telephone Encounter (Signed)
Virtual Visit Pre-Appointment Phone Call  Steps For Call:  1. Confirm consent - "In the setting of the current Covid19 crisis, you are scheduled for a (phone or video) visit with your provider on (date) at (time).  Just as we do with many in-office visits, in order for you to participate in this visit, we must obtain consent.  If you'd like, I can send this to your mychart (if signed up) or email for you to review.  Otherwise, I can obtain your verbal consent now.  All virtual visits are billed to your insurance company just like a normal visit would be.  By agreeing to a virtual visit, we'd like you to understand that the technology does not allow for your provider to perform an examination, and thus may limit your provider's ability to fully assess your condition. If your provider identifies any concerns that need to be evaluated in person, we will make arrangements to do so.  Finally, though the technology is pretty good, we cannot assure that it will always work on either your or our end, and in the setting of a video visit, we may have to convert it to a phone-only visit.  In either situation, we cannot ensure that we have a secure connection.  Are you willing to proceed?" STAFF: Did the patient verbally acknowledge consent to telehealth visit? Document YES/NO here: yes  2. Confirm the BEST phone number to call the day of the visit by including in appointment notes  3. Give patient instructions for MyChart download to smartphone OR [email or web address] as below if video visit (depending on what platform provider is using)  4. Confirm that appointment type is correct in Epic appointment notes (VIDEO vs PHONE)  5. Advise patient to be prepared with their blood pressure, heart rate, weight, any heart rhythm information, their current medicines, and a piece of paper and pen handy for any instructions they may receive the day of their visit  6. Inform patient they will receive a phone call 15 minutes  prior to their appointment time (may be from unknown caller ID) so they should be prepared to answer    TELEPHONE CALL NOTE  [name] [name] has been deemed a candidate for a follow-up tele-health visit to limit community exposure during the Covid-19 pandemic. I spoke with the patient via phone to ensure availability of phone/video source, confirm preferred email & phone number, and discuss instructions and expectations.  I reminded [name] [name] to be prepared with any vital sign and/or heart rhythm information that could potentially be obtained via home monitoring, at the time of his visit. I reminded [name] [name] to expect a phone call prior to his visit.  Geraldine Contras 10/25/2018 9:26 AM   INSTRUCTIONS FOR DOWNLOADING THE MYCHART APP TO SMARTPHONE  - The patient must first make sure to have activated MyChart and know their login information - If Apple, go to [employer] and type in MyChart in the search bar and download the app. If Android, ask patient to go to [employer] and type in [facility] in the search bar and download the app. The app is free but as with any other app downloads, their phone may require them to verify saved payment information or Apple/Android password.  - The patient will need to then log into the app with their MyChart username and password, and select [facility] as their healthcare provider to link the account. When it is time for your visit,  go to the MyChart app, find appointments, and click Begin Video Visit. Be sure to Select Allow for your device to access the Microphone and Camera for your visit. You will then be connected, and your provider will be with you shortly.  **If they have any issues connecting, or need assistance please contact MyChart service desk [phone])**  **If using a computer, in order to ensure the best quality for their visit they will need to use either of the following Internet Browsers: [employer], or Google  Chrome**  IF USING DOXIMITY or DOXY.ME - The patient will receive a link just prior to their visit by text.     FULL LENGTH CONSENT FOR TELE-HEALTH VISIT   I hereby voluntarily request, consent and authorize [facility] and its employed or contracted physicians, physician assistants, nurse practitioners or other licensed health care professionals (the Practitioner), to provide me with telemedicine health care services (the Services") as deemed necessary by the treating Practitioner. I acknowledge and consent to receive the Services by the Practitioner via telemedicine. I understand that the telemedicine visit will involve communicating with the Practitioner through live audiovisual communication technology and the disclosure of certain medical information by electronic transmission. I acknowledge that I have been given the opportunity to request an in-person assessment or other available alternative prior to the telemedicine visit and am voluntarily participating in the telemedicine visit.  I understand that I have the right to withhold or withdraw my consent to the use of telemedicine in the course of my care at any time, without affecting my right to future care or treatment, and that the Practitioner or I may terminate the telemedicine visit at any time. I understand that I have the right to inspect all information obtained and/or recorded in the course of the telemedicine visit and may receive copies of available information for a reasonable fee.  I understand that some of the potential risks of receiving the Services via telemedicine include:   Delay or interruption in medical evaluation due to technological equipment failure or disruption;  Information transmitted may not be sufficient (e.g. poor resolution of images) to allow for appropriate medical decision making by the Practitioner; and/or   In rare instances, security protocols could fail, causing a breach of personal health  information.  Furthermore, I acknowledge that it is my responsibility to provide information about my medical history, conditions and care that is complete and accurate to the best of my ability. I acknowledge that Practitioner's advice, recommendations, and/or decision may be based on factors not within their control, such as incomplete or inaccurate data provided by me or distortions of diagnostic images or specimens that may result from electronic transmissions. I understand that the practice of medicine is not an exact science and that Practitioner makes no warranties or guarantees regarding treatment outcomes. I acknowledge that I will receive a copy of this consent concurrently upon execution via email to the email address I last provided but may also request a printed copy by calling the office of [facility].    I understand that my insurance will be billed for this visit.   I have read or had this consent read to me.  I understand the contents of this consent, which adequately explains the benefits and risks of the Services being provided via telemedicine.   I have been provided ample opportunity to ask questions regarding this consent and the Services and have had my questions answered to my satisfaction.  I give my informed  consent for the services to be provided through the use of telemedicine in my medical care  By participating in this telemedicine visit I agree to the above.

## 2018-10-26 ENCOUNTER — Telehealth (INDEPENDENT_AMBULATORY_CARE_PROVIDER_SITE_OTHER): Payer: BLUE CROSS/BLUE SHIELD | Admitting: Cardiology

## 2018-10-26 ENCOUNTER — Encounter: Payer: Self-pay | Admitting: Cardiology

## 2018-10-26 VITALS — BP 130/62 | HR 77 | Ht 70.0 in | Wt 204.0 lb

## 2018-10-26 DIAGNOSIS — I10 Essential (primary) hypertension: Secondary | ICD-10-CM

## 2018-10-26 DIAGNOSIS — I712 Thoracic aortic aneurysm, without rupture: Secondary | ICD-10-CM

## 2018-10-26 DIAGNOSIS — I7121 Aneurysm of the ascending aorta, without rupture: Secondary | ICD-10-CM

## 2018-10-26 DIAGNOSIS — Z952 Presence of prosthetic heart valve: Secondary | ICD-10-CM

## 2018-10-26 DIAGNOSIS — I6523 Occlusion and stenosis of bilateral carotid arteries: Secondary | ICD-10-CM

## 2018-10-26 DIAGNOSIS — E782 Mixed hyperlipidemia: Secondary | ICD-10-CM

## 2018-10-26 NOTE — Progress Notes (Signed)
Medication Instructions:  Your physician recommends that you continue on your current medications as directed. Please refer to the Current Medication list given to you today.   Labwork: none  Testing/Procedures: Non-Cardiac CT Angiography (CTA), is a special type of CT scan that uses a computer to produce multi-dimensional views of major blood vessels throughout the body. In CT angiography, a contrast material is injected through an IV to help visualize the blood vessels   Follow-Up: Your physician wants you to follow-up in: 1 year. You will receive a reminder letter in the mail two months in advance. If you don't receive a letter, please call our office to schedule the follow-up appointment.   Any Other Special Instructions Will Be Listed Below (If Applicable).     If you need a refill on your cardiac medications before your next appointment, please call your pharmacy.

## 2018-10-26 NOTE — Progress Notes (Signed)
Virtual Visit via Telephone Note   This visit type was conducted due to national recommendations for restrictions regarding the COVID-19 Pandemic (e.g. social distancing) in an effort to limit this patient's exposure and mitigate transmission in our community.  Due to his co-morbid illnesses, this patient is at least at moderate risk for complications without adequate follow up.  This format is felt to be most appropriate for this patient at this time.  The patient did not have access to video technology/had technical difficulties with video requiring transitioning to audio format only (telephone).  All issues noted in this document were discussed and addressed.  No physical exam could be performed with this format.  Please refer to the patient's chart for his  consent to telehealth for [facility].   Evaluation Performed:  Follow-up visit  Date:  10/26/2018   ID:  [name] [name], [name] [name] Feb 26, 1956, MRN [id number]  Patient Location: Home Provider Location: Home  PCP:  Ernestina Penna, MD  Cardiologist:  Dina Rich, MD  Electrophysiologist:  None   Chief Complaint:  1 year follow up  History of Present Illness:    [name] [name] is a 63 y.o. male seen today for follow up of the following medical problems    1. AVR - history of bicuspid AV, s/p bioprosthetic AVR in 2012  - echo 11/2016 LVEF 60-65%, no WMAs, normal diastolic function, mild gradient across AVR.  - a stress test wascancelled, as patient was undergoing extensive management for gallstones. Chest pain has resolved with resolution of his GI issues  - denies any SOB or DOE. No LE edema since last visit    2. Ascending aortic aneurysm - mild aortic root dilatation at 4.1 to 4.2 cm by last MRI 10/2013, likely related to hx of bicuspid AV and potential coexisting aortopathy - CTA 10/2014 4.1 cm ascending aortic aneurysm.  - CTA 5/2018stable 4 cm aneurysm  10/2017 CTA Chest/Abd/Pelvis Sovah (in epic labeled as CT  A/P): 4 cm aneurysm - no recent chest pain  3. HTN - on higher dose of lisinopril had symptomatic low bp's - he remains compliant with meds   4. OSA - not compliant with CPAP due to discomfort. Has not been intrested in retyring   5. Hyperlipidemia Jan 2020 TC 111 TG 166 HDL 28 LDL 50 - compliant with statin  6. DM2 - followed by pcp    7. Pulmonary Nodules - have been stable by imaging, monitored along with his aneurysm with his annual CT chests  8. Carotid stenosis - followed by vascular - 09/2018 [place] LICA <50%, right 50-69% - no recent symptoms  The patient does not have symptoms concerning for COVID-19 infection (fever, chills, cough, or new shortness of breath).      Past Medical History:  Diagnosis Date  . Aortic stenosis 8/09   Bicuspid aortic valve  . Arthritis 6/10   hand   . Atrial fibrillation (HCC)    post op only  . Bile duct calculus with acute cholecystitis   . CAD (coronary artery disease) 8/09  . Calculus of common bile duct with acute pancreatitis 11/2016  . CTS (carpal tunnel syndrome)    bilateral   . Diastolic dysfunction 8/09  . Elevated LFTs   . Fatty liver disease, nonalcoholic   . History of kidney stones   . Hyperlipidemia   . Hypertension   . Kidney stones   . Nasal congestion   . NIDDM (non-insulin dependent diabetes mellitus)  x15 yrs  . OSA on CPAP 2006  . Paresthesias in left hand 02/19/2004  . Paresthesias in right hand 02/19/2004  . Sinusitis   . Vitamin D deficiency 06/13/09   Past Surgical History:  Procedure Laterality Date  . bladder cancer - operation - 01/21/18 - Dr Berneice Heinrich  01/21/2018  . CARPAL TUNNEL RELEASE     bilateral   . CHOLECYSTECTOMY  11/2016  . COLONOSCOPY N/A 09/22/2018   Procedure: COLONOSCOPY;  Surgeon: Malissa Hippo, MD;  Location: [facility];  Service: Endoscopy;  Laterality: N/A;  1:00  . CORONARY ANGIOPLASTY WITH STENT PLACEMENT     to rt coronary atery (Dr. Degert-cardiologist)  .  CYSTOSCOPY WITH RETROGRADE PYELOGRAM, URETEROSCOPY AND STENT PLACEMENT Left 03/07/2018   Procedure: CYSTOSCOPY WITH RETROGRADE PYELOGRAM,  AND STENT PLACEMENT;  Surgeon: Sebastian Ache, MD;  Location: [facility];  Service: Urology;  Laterality: Left;  . ERCP N/A 11/24/2016   Procedure: ENDOSCOPIC RETROGRADE CHOLANGIOPANCREATOGRAPHY (ERCP);  Surgeon: Malissa Hippo, MD;  Location: [facility];  Service: Endoscopy;  Laterality: N/A;  . EXTRACORPOREAL SHOCK WAVE LITHOTRIPSY Left 03/17/2018   Procedure: LEFT EXTRACORPOREAL SHOCK WAVE LITHOTRIPSY (ESWL);  Surgeon: Bjorn Pippin, MD;  Location: [facility];  Service: Urology;  Laterality: Left;  . REMOVAL OF STONES N/A 11/24/2016   Procedure: REMOVAL OF STONES;  Surgeon: Malissa Hippo, MD;  [facility];  Service: Endoscopy;  Laterality: N/A;  . SPHINCTEROTOMY N/A 11/24/2016   Procedure: SPHINCTEROTOMY;  Surgeon: Malissa Hippo, MD;  [facility];  Service: Endoscopy;  Laterality: N/A;  . TISSUE AORTIC VALVE REPLACEMENT     2012     Current Meds  Medication Sig  . ANDROGEL PUMP 20.25 MG/ACT (1.62%) GEL APPLY THREE PUMPS AS DIRECTED - ON NEXT fill, run generic 11/23 ad (Patient taking differently: Apply 3 Pump topically daily. )  . aspirin EC 81 MG tablet Take 81 mg by mouth daily.  [name] atenolol (TENORMIN) 25 MG tablet TAKE 1 TABLET DAILY  . atorvastatin (LIPITOR) 40 MG tablet TAKE 1 TABLET DAILY  . cholecalciferol (VITAMIN D) 1000 UNITS tablet Take 2,000 Units by mouth daily.   . fexofenadine (ALLEGRA) 180 MG tablet Take 180 mg by mouth daily.  . fluticasone (FLONASE) 50 MCG/ACT nasal spray Place 2 sprays into the nose daily as needed for allergies.   [name] glipiZIDE (GLUCOTROL) 5 MG tablet Take 2 tablets (10 mg total) by mouth 2 (two) times daily before a meal.  . Icosapent Ethyl (VASCEPA) 1 g CAPS Take 1 g by mouth daily.  [name] lisinopril (PRINIVIL,ZESTRIL) 20 MG tablet TAKE 1 TABLET DAILY  . metFORMIN (GLUCOPHAGE) 1000 MG tablet TAKE 1  TABLET TWICE DAILY WITH MEALS (Patient taking differently: Take 1,000 mg by mouth 2 (two) times daily with a meal. )  . promethazine (PHENERGAN) 25 MG tablet TAKE 1 TABLET EVERY 4 HOURS AS NEEDED (Patient taking differently: Take 25 mg by mouth every 4 (four) hours as needed for nausea or vomiting. )  . RYBELSUS 3 MG TABS TAKE 1 TABLET BY MOUTH EVERY MORNING 30 MINUTES BEFORE FIRST FOOD, BEVERAGE, OR MEDICATION OF THE DAY. TAKE WITH FOUR OUNCES OF WATER. EAT WITHIN 30-60 MINUTES AFTER. (Patient taking differently: Take 3 mg by mouth every morning. )  . Simethicone (GAS-X PO) Take 1 capsule by mouth daily.  . temazepam (RESTORIL) 15 MG capsule TAKE 1 CAPSULE BY MOUTH AT BEDTIME AS NEEDED (Patient taking differently: Take 15 mg by mouth at bedtime as  needed for sleep. TAKE 1 CAPSULE BY MOUTH AT BEDTIME AS NEEDED)     Allergies:   Glyxambi [empagliflozin-linagliptin]; Hctz [hydrochlorothiazide]; Sulfa antibiotics; and Sulfonamide derivatives   Social History   Tobacco Use  . Smoking status: Former Smoker    Packs/day: 1.50    Years: 25.00    Pack years: 37.50    Types: Cigarettes    Start date: 07/06/1974    Last attempt to quit: 07/06/2002    Years since quitting: 16.3  . Smokeless tobacco: Never Used  Substance Use Topics  . Alcohol use: No    Alcohol/week: 0.0 standard drinks  . Drug use: No     Family Hx: The patient's family history includes Alzheimer's disease in his mother; Diabetes in his father and another family member; Hearing loss in his father and mother.  ROS:   Please see the history of present illness.     All other systems reviewed and are negative.   Prior CV studies:   The following studies were reviewed today:  09/2012 MRI/MRA Chest Stable 4 cm ascending thoracic aneurysm  08/2010 Cath HEMODYNAMIC DATA: Right atrial pressures 7/5 with a mean of 4 mmHg,  right ventricular pressure is 24 with an EDP of 7 mmHg. Pulmonary  artery pressure is 20/7 with a mean of  13 mmHg. Pulmonary capillary  wedge pressure is 10/7 with a mean of 6 mmHg. Aortic pressure is 118/67  with a mean of 88 mmHg. Left ventricular pressure is 161 with an EDP of  12 mmHg. There is no significant mitral valve gradient. The aortic  valve mean gradient is 31 mmHg with calculated valve area of 0.8  centimeter squared. Thermodilution cardiac output is 4.4 L per minute  with an index of 2, Fick cardiac outputs 4.7 L per minute with an index  of 2.2.  ANGIOGRAPHIC DATA: The left ventricular angiography was performed in a  RAO view. This demonstrates normal left ventricular size and  contractility with normal systolic function. Ejection fraction is  estimated at 65%. The aortic valve is calcified with eccentric opening  and restrictive mobility.  The left coronary artery arises and distributes normally. The left main  coronary has 20% narrowing in the ostium and in the distal left main.  The left anterior descending artery is a tortuous vessel which has less  than 10% irregularities.  Left circumflex coronary artery is rise to first obtuse marginal vessel  which has a 40-50% stenosis proximally.  The right coronary artery is a dominant vessel, it has 20% disease in  the distal vessel. The stent in the midvessel was widely patent.  FINAL INTERPRETATION:  1. Nonobstructive atherosclerotic coronary artery disease.  2. Normal left ventricular function.  3. Severe aortic stenosis.  4. Normal right heart pressures.  PLAN: Proceed with aortic valve replacement.  10/2013 MRA Chest EXAM: MRA CHEST WITH OR WITHOUT CONTRAST  TECHNIQUE: Angiographic images of the chest were obtained using MRA technique without and with intravenous contrast.  CONTRAST: 20mL MULTIHANCE GADOBENATE DIMEGLUMINE 529 MG/ML IV SOLN  COMPARISON: CT UROGRAM dated 05/08/2011; MR MRA CHEST W/ OR W/O CM dated 10/07/2012; CT ANGIO CHEST dated 09/08/2010  FINDINGS: There is stable  aneurysmal dilatation of the aortic root that does not involve the sinuses of Valsalva. No aortic dissection is present. Artifact is noted from a prosthetic aortic valve. Diameter at the level of the sinuses of Valsalva is approximately 3.2 cm. Maximal caliber of the ascending thoracic aorta is 4.1- 4.2 cm. The proximal  arch measures 3.8 cm. The distal arch measures 2.6 cm. The descending thoracic aorta measures 2.6 cm. Proximal great vessels show stable and normal patency without anatomical variant.  The heart size is normal. No pleural or pericardial fluid is identified. No masses or lymphadenopathy are seen. Visualized spine shows no abnormalities.  IMPRESSION: Stable aneurysmal dilatation of the ascending thoracic aorta measuring 4.1- 4.2 cm in greatest diameter. No dissection is identified.  10/2014 CTA Chest IMPRESSION: 1. Mild fusiform aneurysmal dilatation of the ascending thoracic aorta measuring approximately 41 mm in maximal diameter, stable since the 2012 examination. 2. Stable sequela of prior median sternotomy and aortic valve replacement. 3. Coronary artery calcifications. 4. Punctate (approximately 5 mm) in determine right lower lobe pulmonary nodule, not definitely seen on the 20/2012 examination. If the patient is at high risk for bronchogenic carcinoma, follow-up chest CT at 6-12 months is recommended. If the patient is at low risk for bronchogenic carcinoma, follow-up chest CT at 12 months is recommended. This recommendation follows the consensus statement:  11/2015 CTA chest  IMPRESSION: Mild aneurysmal dilatation of the ascending aorta with a maximal diameter of 4.0 cm. This is not significantly changed.   11/2016 echo Study Conclusions  - Left ventricle: The cavity size was normal. Wall thickness was normal. Systolic function was normal. The estimated ejection fraction was in the range of 60% to 65%. Wall motion was normal; there were no  regional wall motion abnormalities. Left ventricular diastolic function parameters were normal. - Aortic valve: There isa 25-mm Edwards pericardial Magna-Ease valve model #3300TFX in the AV position. Mildly calcified annulus. Trileaflet; normal thickness leaflets. There is a mild gradient across the prosthetic valve. Mean gradient (S): 12 mm Hg. Mean gradient of 18 mmHg with pedhoff probe. Valve area (VTI): 1.71 cm^2. - Technically adequate study.  11/2016 CTA chest IMPRESSION: 1. Stable mild fusiform aneurysm of the ascending aorta, measuring maximum diameter of 4.0 cm. Recommend annual imaging followup by CTA or MRA. This recommendation follows 2010 ACCF/AHA/AATS/ACR/ASA/SCA/SCAI/SIR/STS/SVM Guidelines for the Diagnosis and Management of Patients with Thoracic Aortic Disease. Circulation. 2010; 121: F121-F758 2. Long-term stability of pulmonary nodules, consistent with benign process.   Labs/Other Tests and Data Reviewed:    EKG:  na  Recent Labs: 08/22/2018: ALT 18; BUN 45; Creat 1.79; Hemoglobin 14.9; Platelets 199; Potassium 4.8; Sodium 137   Recent Lipid Panel Lab Results  Component Value Date/Time   CHOL 111 08/02/2018 04:50 PM   CHOL 76 12/05/2012 09:31 AM   TRIG 166 (H) 08/02/2018 04:50 PM   TRIG 180 (H) 01/19/2017 11:31 AM   TRIG 43 12/05/2012 09:31 AM   HDL 28 (L) 08/02/2018 04:50 PM   HDL 28 (L) 01/19/2017 11:31 AM   HDL 36 (L) 12/05/2012 09:31 AM   CHOLHDL 4.0 08/02/2018 04:50 PM   LDLCALC 50 08/02/2018 04:50 PM   LDLCALC 50 02/28/2014 11:16 AM   LDLCALC 31 12/05/2012 09:31 AM    Wt Readings from Last 3 Encounters:  09/19/18 200 lb (90.7 kg)  08/22/18 201 lb (91.2 kg)  08/02/18 207 lb (93.9 kg)     Objective:    Vital Signs:  p 77 bp 130/62  Normal affect. Normal speech pattern and tone. Comfortable in no distress. No auditory signs of SOB or wheezing.   ASSESSMENT & PLAN:     1. Aortic valve replacement - hx of bicuspid AV, s/p  bioprosthetic AVR in 2012. Mild gradient across the valve by echo 11/2016 with overall normal function - no recent  symptoms, continue to monitor.    2. Ascending aortic root aneurysm - has been stable with serial imaging - due for repeat scan. Due to COVID-19 will schedule for July  3. HTN - he is at goal, continue current meds   4. Hyperlipidemia -LDL at goal, continue statin. Discussed dietary and exercise modification to improve TGs and HDL  5. Carotid stenosis - continue medical therapy - continue to follow with vascular        COVID-19 Education: The signs and symptoms of COVID-19 were discussed with the patient and how to seek care for testing (follow up with PCP or arrange E-visit).  The importance of social distancing was discussed today.  Time:   Today, I have spent 28 minutes with the patient with telehealth technology discussing the above problems.     Medication Adjustments/Labs and Tests Ordered: Current medicines are reviewed at length with the patient today.  Concerns regarding medicines are outlined above.   Tests Ordered: No orders of the defined types were placed in this encounter.   Medication Changes: No orders of the defined types were placed in this encounter.   Disposition:  Follow up 1 year. Order CTA thoracic aorta for July 2020  Joanie Coddington, MD  10/26/2018 8:26 AM    [facility]

## 2018-10-28 ENCOUNTER — Ambulatory Visit: Payer: BLUE CROSS/BLUE SHIELD | Admitting: Cardiology

## 2018-11-04 ENCOUNTER — Other Ambulatory Visit (INDEPENDENT_AMBULATORY_CARE_PROVIDER_SITE_OTHER): Payer: Self-pay | Admitting: Internal Medicine

## 2018-11-09 ENCOUNTER — Encounter (INDEPENDENT_AMBULATORY_CARE_PROVIDER_SITE_OTHER): Payer: Self-pay

## 2018-11-10 ENCOUNTER — Other Ambulatory Visit: Payer: Self-pay | Admitting: Family Medicine

## 2018-12-11 ENCOUNTER — Other Ambulatory Visit: Payer: Self-pay | Admitting: Family Medicine

## 2018-12-19 ENCOUNTER — Ambulatory Visit (INDEPENDENT_AMBULATORY_CARE_PROVIDER_SITE_OTHER): Payer: BC Managed Care – PPO | Admitting: Family Medicine

## 2018-12-19 ENCOUNTER — Other Ambulatory Visit: Payer: Self-pay

## 2018-12-19 ENCOUNTER — Encounter: Payer: Self-pay | Admitting: Family Medicine

## 2018-12-19 DIAGNOSIS — I10 Essential (primary) hypertension: Secondary | ICD-10-CM | POA: Diagnosis not present

## 2018-12-19 DIAGNOSIS — E559 Vitamin D deficiency, unspecified: Secondary | ICD-10-CM

## 2018-12-19 DIAGNOSIS — K760 Fatty (change of) liver, not elsewhere classified: Secondary | ICD-10-CM

## 2018-12-19 DIAGNOSIS — N400 Enlarged prostate without lower urinary tract symptoms: Secondary | ICD-10-CM

## 2018-12-19 DIAGNOSIS — K219 Gastro-esophageal reflux disease without esophagitis: Secondary | ICD-10-CM | POA: Diagnosis not present

## 2018-12-19 DIAGNOSIS — R0989 Other specified symptoms and signs involving the circulatory and respiratory systems: Secondary | ICD-10-CM

## 2018-12-19 DIAGNOSIS — I350 Nonrheumatic aortic (valve) stenosis: Secondary | ICD-10-CM

## 2018-12-19 DIAGNOSIS — E1169 Type 2 diabetes mellitus with other specified complication: Secondary | ICD-10-CM

## 2018-12-19 DIAGNOSIS — E1165 Type 2 diabetes mellitus with hyperglycemia: Secondary | ICD-10-CM | POA: Diagnosis not present

## 2018-12-19 DIAGNOSIS — I712 Thoracic aortic aneurysm, without rupture: Secondary | ICD-10-CM

## 2018-12-19 DIAGNOSIS — I7121 Aneurysm of the ascending aorta, without rupture: Secondary | ICD-10-CM

## 2018-12-19 DIAGNOSIS — E785 Hyperlipidemia, unspecified: Secondary | ICD-10-CM

## 2018-12-19 DIAGNOSIS — IMO0001 Reserved for inherently not codable concepts without codable children: Secondary | ICD-10-CM

## 2018-12-19 DIAGNOSIS — R710 Precipitous drop in hematocrit: Secondary | ICD-10-CM

## 2018-12-19 NOTE — Patient Instructions (Signed)
Follow-up with cardiology urology and gastroenterology as planned Do not forget to get eye exam on a yearly basis Continue to be careful and not put self at risk for COVID Wear gloves and respiratory protection when out in the public Continue to make all efforts to lose weight through diet and exercise Keep blood sugars checked regularly, check blood pressures regularly and always examine feet to make sure there is no sign of any infection Continue to be careful and not put self at risk for falling. Have urologist do rectal exam at your next visit with him.

## 2018-12-19 NOTE — Progress Notes (Signed)
Virtual Visit Via telephone Note I connected with@ on 12/19/18 by telephone and verified that I am speaking with the correct person or authorized healthcare agent using two identifiers. [name] [name] is currently located at home and there are no unauthorized people in close proximity. I completed this visit while in a private location in my home .  This visit type was conducted due to national recommendations for restrictions regarding the COVID-19 Pandemic (e.g. social distancing).  This format is felt to be most appropriate for this patient at this time.  All issues noted in this document were discussed and addressed.  No physical exam was performed.    I discussed the limitations, risks, security and privacy concerns of performing an evaluation and management service by telephone and the availability of in person appointments. I also discussed with the patient that there may be a patient responsible charge related to this service. The patient expressed understanding and agreed to proceed.   Date:  12/19/2018    ID:  [name] [name]      12-22-1955        [id number]   Patient Care Team Patient Care Team: Chipper Herb, MD as PCP - General (Family Medicine) Harl Bowie Alphonse Guild, MD as PCP - Cardiology (Cardiology) Cathe Mons, MD as Consulting Physician (Internal Medicine)  Reason for Visit: Primary Care Follow-up     History of Present Illness & Review of Systems:     [name] [name] is a 63 y.o. year old male primary care patient that presents today for a telehealth visit.  The patient is pleasant and doing well.  No specific complaints.  He is feeling much better with his stomach since seeing [name]. Melony [name] and getting started on metronidazole which he takes on an as-needed basis.  He has a history of a gas bloat syndrome.  The patient today denies any chest pain pressure tightness or shortness of breath.  He says he is swallowing well with no heartburn or indigestion and not  having any nausea vomiting diarrhea or blood in the stool.  He recently had a colonoscopy in April of this year and everything was stable and there is no family history of colon cancer so she he will not be due for another one until 10 years from now.  He does have some nocturia quite frequently and is questioning whether Rybelsus may be the cause of this.  We will get a urinalysis when he comes to the office for his blood work.  The AndroGel is also working well for him.  He is in need of an eye exam.  Review of systems as stated, otherwise negative.  The patient does not have symptoms concerning for COVID-19 infection (fever, chills, cough, or new shortness of breath).      Current Medications (Verified) Allergies as of 12/19/2018      Reactions   Glyxambi [empagliflozin-linagliptin] Diarrhea, Itching   He was able to take Jardiance alone with no side effects so it was the Tradjenta part that most likely gave him the reaction   Hctz [hydrochlorothiazide]    Nausea and headache    Sulfa Antibiotics Nausea And Vomiting   Sulfonamide Derivatives       Medication List       Accurate as of December 19, 2018 10:00 AM. If you have any questions, ask your nurse or doctor.        AndroGel Pump 20.25 MG/ACT (1.62%) Gel Generic drug: Testosterone APPLY  THREE PUMPS AS DIRECTED - ON NEXT fill, run generic 11/23 ad What changed: See the new instructions.   aspirin EC 81 MG tablet Take 81 mg by mouth daily.   atenolol 25 MG tablet Commonly known as: TENORMIN TAKE 1 TABLET DAILY   atorvastatin 40 MG tablet Commonly known as: LIPITOR TAKE 1 TABLET DAILY   cholecalciferol 1000 units tablet Commonly known as: VITAMIN D Take 2,000 Units by mouth daily.   fexofenadine 180 MG tablet Commonly known as: ALLEGRA Take 180 mg by mouth daily.   fluticasone 50 MCG/ACT nasal spray Commonly known as: FLONASE Place 2 sprays into the nose daily as needed for allergies.   GAS-X PO Take 1 capsule by  mouth daily.   glipiZIDE 5 MG tablet Commonly known as: GLUCOTROL Take 2 tablets (10 mg total) by mouth 2 (two) times daily before a meal.   lisinopril 20 MG tablet Commonly known as: ZESTRIL TAKE 1 TABLET DAILY   metFORMIN 1000 MG tablet Commonly known as: GLUCOPHAGE TAKE 1 TABLET TWICE DAILY WITH MEALS   metroNIDAZOLE 250 MG tablet Commonly known as: FLAGYL TAKE 1 TABLET BY MOUTH THREE TIMES DAILY   promethazine 25 MG tablet Commonly known as: PHENERGAN TAKE 1 TABLET EVERY 4 HOURS AS NEEDED   Rybelsus 3 MG Tabs Generic drug: Semaglutide TAKE 1 TABLET BY MOUTH EVERY MORNING 30 MINUTES BEFORE FIRST FOOD, BEVERAGE, OR MEDICATION OF THE DAY. TAKE WITH FOUR OUNCES OF WATER. EAT WITHIN 30-60 MINUTES AFTER.   temazepam 15 MG capsule Commonly known as: RESTORIL TAKE 1 CAPSULE BY MOUTH AT BEDTIME AS NEEDED What changed:   how much to take  how to take this  when to take this  reasons to take this   Vascepa 1 g Caps Generic drug: Icosapent Ethyl Take 1 g by mouth daily.           Allergies (Verified)    Glyxambi [empagliflozin-linagliptin], Hctz [hydrochlorothiazide], Sulfa antibiotics, and Sulfonamide derivatives  Past Medical History Past Medical History:  Diagnosis Date  . Aortic stenosis 8/09   Bicuspid aortic valve  . Arthritis 6/10   hand   . Atrial fibrillation (HCC)    post op only  . Bile duct calculus with acute cholecystitis   . CAD (coronary artery disease) 8/09  . Calculus of common bile duct with acute pancreatitis 11/2016  . CTS (carpal tunnel syndrome)    bilateral   . Diastolic dysfunction 8/09  . Elevated LFTs   . Fatty liver disease, nonalcoholic   . History of kidney stones   . Hyperlipidemia   . Hypertension   . Kidney stones   . Nasal congestion   . NIDDM (non-insulin dependent diabetes mellitus)    x15 yrs  . OSA on CPAP 2006  . Paresthesias in left hand 02/19/2004  . Paresthesias in right hand 02/19/2004  . Sinusitis   .  Vitamin D deficiency 06/13/09     Past Surgical History:  Procedure Laterality Date  . bladder cancer - operation - 01/21/18 - [name] [name]  01/21/2018  . CARPAL TUNNEL RELEASE     bilateral   . CHOLECYSTECTOMY  11/2016  . COLONOSCOPY N/A 09/22/2018   Procedure: COLONOSCOPY;  Surgeon: Malissa Hippoehman, Najeeb U, MD;  [facility];  Service: Endoscopy;  Laterality: N/A;  1:00  . CORONARY ANGIOPLASTY WITH STENT PLACEMENT     to rt coronary atery ([name]. Degert-cardiologist)  . CYSTOSCOPY WITH RETROGRADE PYELOGRAM, URETEROSCOPY AND STENT PLACEMENT Left 03/07/2018   Procedure: CYSTOSCOPY  WITH RETROGRADE PYELOGRAM,  AND STENT PLACEMENT;  Surgeon: Sebastian Ache, MD;  Location: [facility];  Service: Urology;  Laterality: Left;  . ERCP N/A 11/24/2016   Procedure: ENDOSCOPIC RETROGRADE CHOLANGIOPANCREATOGRAPHY (ERCP);  Surgeon: Malissa Hippo, MD;  [facility];  Service: Endoscopy;  Laterality: N/A;  . EXTRACORPOREAL SHOCK WAVE LITHOTRIPSY Left 03/17/2018   Procedure: LEFT EXTRACORPOREAL SHOCK WAVE LITHOTRIPSY (ESWL);  Surgeon: Bjorn Pippin, MD;  Location: [facility];  Service: Urology;  Laterality: Left;  . REMOVAL OF STONES N/A 11/24/2016   Procedure: REMOVAL OF STONES;  Surgeon: Malissa Hippo, MD;  [facility];  Service: Endoscopy;  Laterality: N/A;  . SPHINCTEROTOMY N/A 11/24/2016   Procedure: SPHINCTEROTOMY;  Surgeon: Malissa Hippo, MD;  [facility];  Service: Endoscopy;  Laterality: N/A;  . TISSUE AORTIC VALVE REPLACEMENT     2012    Social History   Socioeconomic History  . Marital status: Married    Spouse name: Not on file  . Number of children: Not on file  . Years of education: Not on file  . Highest education level: Not on file  Occupational History  . Not on file  Social Needs  . Financial resource strain: Not on file  . Food insecurity    Worry: Not on file    Inability: Not on file  . Transportation needs    Medical: Not on file    Non-medical:  Not on file  Tobacco Use  . Smoking status: Former Smoker    Packs/day: 1.50    Years: 25.00    Pack years: 37.50    Types: Cigarettes    Start date: 07/06/1974    Quit date: 07/06/2002    Years since quitting: 16.4  . Smokeless tobacco: Never Used  Substance and Sexual Activity  . Alcohol use: No    Alcohol/week: 0.0 standard drinks  . Drug use: No  . Sexual activity: Not on file  Lifestyle  . Physical activity    Days per week: Not on file    Minutes per session: Not on file  . Stress: Not on file  Relationships  . Social Musician on phone: Not on file    Gets together: Not on file    Attends religious service: Not on file    Active member of club or organization: Not on file    Attends meetings of clubs or organizations: Not on file    Relationship status: Not on file  Other Topics Concern  . Not on file  Social History Narrative  . Not on file     Family History  Problem Relation Age of Onset  . Hearing loss Mother   . Alzheimer's disease Mother   . Hearing loss Father   . Diabetes Father   . Diabetes Other        Family History       Labs/Other Tests and Data Reviewed:    Wt Readings from Last 3 Encounters:  10/26/18 204 lb (92.5 kg)  09/19/18 200 lb (90.7 kg)  08/22/18 201 lb (91.2 kg)   Temp Readings from Last 3 Encounters:  09/22/18 97.8 F (36.6 C) (Oral)  09/19/18 (!) 97.1 F (36.2 C) (Temporal)  08/22/18 97.8 F (36.6 C)   BP Readings from Last 3 Encounters:  10/26/18 130/62  09/22/18 (!) 100/56  09/19/18 (!) 152/80   Pulse Readings from Last 3 Encounters:  10/26/18 77  09/22/18 (!) 59  09/19/18 80  Lab Results  Component Value Date   HGBA1C 9.5 (H) 08/02/2018   HGBA1C 8.4 (H) 02/24/2018   HGBA1C 8.5 (H) 10/12/2017   Lab Results  Component Value Date   MICROALBUR 20 06/26/2014   LDLCALC 50 08/02/2018   CREATININE 1.79 (H) 08/22/2018       Chemistry      Component Value Date/Time   NA 137 08/22/2018 0949    NA 140 08/02/2018 1650   K 4.8 08/22/2018 0949   CL 103 08/22/2018 0949   CO2 21 08/22/2018 0949   BUN 45 (H) 08/22/2018 0949   BUN 21 08/02/2018 1650   CREATININE 1.79 (H) 08/22/2018 0949      Component Value Date/Time   CALCIUM 9.6 08/22/2018 0949   ALKPHOS 79 08/02/2018 1650   AST 15 08/22/2018 0949   ALT 18 08/22/2018 0949   BILITOT 0.5 08/22/2018 0949   BILITOT 0.4 08/02/2018 1650         OBSERVATIONS/ OBJECTIVE:     The patient is alert and doing well.  He says his blood pressures have been running in the 130s over the 60s.  His weight is running between 202 and 207 and this morning was 202.  His fasting blood sugars tend to be around 142.  During the day they may between run between 1 82-80.  This is much improved with the new medication.  He cannot say that with lower blood sugar control that he feels any better or any worse.  He has had increasing nocturia.  He is in need of an eye exam.  He does not need any refills.  He still having some stress in his life with his family.  Physical exam deferred due to nature of telephonic visit.  ASSESSMENT & PLAN    Time:   Today, I have spent 28 minutes with the patient via telephone discussing the above including Covid precautions.     Visit Diagnoses: 1. Uncontrolled type 2 diabetes mellitus without complication, without long-term current use of insulin (HCC) -Blood sugar control may need to be tweaked more.  We will wait till the A1c is been returned.  2. Bilateral carotid bruits -Follow-up with [name]. early as planned for carotid evaluation  3. Gastroesophageal reflux disease, esophagitis presence not specified -No complaints with this today.  4. Essential hypertension -Continue to watch sodium intake and continue with current antihypertensive meds.  Continue to work on weight with aggressive therapeutic lifestyle changes including diet and exercise  5. Hyperlipidemia associated with type 2 diabetes mellitus (HCC)  -Continue to watch diet closely and continue with current medicines pending results of lab work and follow-up with cardiology as planned  6. Benign prostatic hyperplasia, unspecified whether lower urinary tract symptoms present -[facility] and have him do rectal exam.  We will make sure that he gets a copy of blood work that is to be done.  7. Ascending aortic aneurysm (HCC) -Follow-up with vascular surgery and cardiology as planned  8. Vitamin D deficiency -Continue vitamin D replacement pending results of lab work  9. NAFLD (nonalcoholic fatty liver disease) -Continue to work aggressively on weight loss with diet and exercise  10. Aortic stenosis, severe -Follow-up with cardiology as planned  11. Decreased hemoglobin -Check CBC  12.  Testosterone deficiency -Check testosterone level  Patient Instructions  Follow-up with cardiology urology and gastroenterology as planned Do not forget to get eye exam on a yearly basis Continue to be careful and  not put self at risk for COVID Wear gloves and respiratory protection when out in the public Continue to make all efforts to lose weight through diet and exercise Keep blood sugars checked regularly, check blood pressures regularly and always examine feet to make sure there is no sign of any infection Continue to be careful and not put self at risk for falling. Have urologist do rectal exam at your next visit with him.      The above assessment and management plan was discussed with the patient. The patient verbalized understanding of and has agreed to the management plan. Patient is aware to call the clinic if symptoms persist or worsen. Patient is aware when to return to the clinic for a follow-up visit. Patient educated on when it is appropriate to go to the emergency department.    Ernestina Pennaonald W. Peruski, MD [facility] [address], [place], [place] [id number] Ph  [phone]   Nyra Capeson W. Hillyard MD

## 2018-12-20 NOTE — Addendum Note (Signed)
Addended by: Zannie Cove on: 12/20/2018 08:14 AM   Modules accepted: Orders

## 2018-12-21 ENCOUNTER — Other Ambulatory Visit: Payer: BC Managed Care – PPO

## 2018-12-21 ENCOUNTER — Other Ambulatory Visit: Payer: Self-pay

## 2018-12-21 DIAGNOSIS — E559 Vitamin D deficiency, unspecified: Secondary | ICD-10-CM | POA: Diagnosis not present

## 2018-12-21 DIAGNOSIS — R710 Precipitous drop in hematocrit: Secondary | ICD-10-CM

## 2018-12-21 DIAGNOSIS — R0989 Other specified symptoms and signs involving the circulatory and respiratory systems: Secondary | ICD-10-CM

## 2018-12-21 DIAGNOSIS — I712 Thoracic aortic aneurysm, without rupture: Secondary | ICD-10-CM

## 2018-12-21 DIAGNOSIS — I350 Nonrheumatic aortic (valve) stenosis: Secondary | ICD-10-CM

## 2018-12-21 DIAGNOSIS — E785 Hyperlipidemia, unspecified: Secondary | ICD-10-CM | POA: Diagnosis not present

## 2018-12-21 DIAGNOSIS — E1169 Type 2 diabetes mellitus with other specified complication: Secondary | ICD-10-CM

## 2018-12-21 DIAGNOSIS — I7121 Aneurysm of the ascending aorta, without rupture: Secondary | ICD-10-CM

## 2018-12-21 DIAGNOSIS — E1165 Type 2 diabetes mellitus with hyperglycemia: Secondary | ICD-10-CM | POA: Diagnosis not present

## 2018-12-21 DIAGNOSIS — I10 Essential (primary) hypertension: Secondary | ICD-10-CM

## 2018-12-21 DIAGNOSIS — IMO0001 Reserved for inherently not codable concepts without codable children: Secondary | ICD-10-CM

## 2018-12-21 DIAGNOSIS — N400 Enlarged prostate without lower urinary tract symptoms: Secondary | ICD-10-CM

## 2018-12-21 DIAGNOSIS — K760 Fatty (change of) liver, not elsewhere classified: Secondary | ICD-10-CM

## 2018-12-21 DIAGNOSIS — K219 Gastro-esophageal reflux disease without esophagitis: Secondary | ICD-10-CM

## 2018-12-21 LAB — URINALYSIS, COMPLETE
Bilirubin, UA: NEGATIVE
Ketones, UA: NEGATIVE
Leukocytes,UA: NEGATIVE
Nitrite, UA: NEGATIVE
RBC, UA: NEGATIVE
Specific Gravity, UA: 1.025 (ref 1.005–1.030)
Urobilinogen, Ur: 0.2 mg/dL (ref 0.2–1.0)
pH, UA: 5 (ref 5.0–7.5)

## 2018-12-21 LAB — MICROSCOPIC EXAMINATION
Bacteria, UA: NONE SEEN
Epithelial Cells (non renal): NONE SEEN /hpf (ref 0–10)
RBC, Urine: NONE SEEN /hpf (ref 0–2)
Renal Epithel, UA: NONE SEEN /hpf
WBC, UA: NONE SEEN /hpf (ref 0–5)

## 2018-12-21 LAB — LIPID PANEL

## 2018-12-21 LAB — BAYER DCA HB A1C WAIVED: HB A1C (BAYER DCA - WAIVED): 9.9 % — ABNORMAL HIGH (ref <7.0)

## 2018-12-22 LAB — BMP8+EGFR
BUN/Creatinine Ratio: 16 (ref 10–24)
BUN: 18 mg/dL (ref 8–27)
CO2: 19 mmol/L — ABNORMAL LOW (ref 20–29)
Calcium: 9.7 mg/dL (ref 8.6–10.2)
Chloride: 105 mmol/L (ref 96–106)
Creatinine, Ser: 1.15 mg/dL (ref 0.76–1.27)
GFR calc Af Amer: 78 mL/min/{1.73_m2} (ref >59)
GFR calc non Af Amer: 67 mL/min/{1.73_m2} (ref >59)
Glucose: 224 mg/dL — ABNORMAL HIGH (ref 65–99)
Potassium: 4.6 mmol/L (ref 3.5–5.2)
Sodium: 142 mmol/L (ref 134–144)

## 2018-12-22 LAB — HEPATIC FUNCTION PANEL
ALT: 39 IU/L (ref 0–44)
AST: 25 IU/L (ref 0–40)
Albumin: 4.8 g/dL (ref 3.8–4.8)
Alkaline Phosphatase: 83 IU/L (ref 39–117)
Bilirubin Total: 0.5 mg/dL (ref 0.0–1.2)
Bilirubin, Direct: 0.15 mg/dL (ref 0.00–0.40)
Total Protein: 7.2 g/dL (ref 6.0–8.5)

## 2018-12-22 LAB — CBC WITH DIFFERENTIAL/PLATELET
Basophils Absolute: 0 10*3/uL (ref 0.0–0.2)
Basos: 0 %
EOS (ABSOLUTE): 0.7 10*3/uL — ABNORMAL HIGH (ref 0.0–0.4)
Eos: 11 %
Hematocrit: 39.8 % (ref 37.5–51.0)
Hemoglobin: 13.6 g/dL (ref 13.0–17.7)
Immature Grans (Abs): 0 10*3/uL (ref 0.0–0.1)
Immature Granulocytes: 0 %
Lymphocytes Absolute: 1.9 10*3/uL (ref 0.7–3.1)
Lymphs: 26 %
MCH: 32.3 pg (ref 26.6–33.0)
MCHC: 34.2 g/dL (ref 31.5–35.7)
MCV: 95 fL (ref 79–97)
Monocytes Absolute: 0.7 10*3/uL (ref 0.1–0.9)
Monocytes: 9 %
Neutrophils Absolute: 3.8 10*3/uL (ref 1.4–7.0)
Neutrophils: 54 %
Platelets: 198 10*3/uL (ref 150–450)
RBC: 4.21 x10E6/uL (ref 4.14–5.80)
RDW: 12.4 % (ref 11.6–15.4)
WBC: 7 10*3/uL (ref 3.4–10.8)

## 2018-12-22 LAB — LIPID PANEL
Chol/HDL Ratio: 3.7 ratio (ref 0.0–5.0)
Cholesterol, Total: 106 mg/dL (ref 100–199)
HDL: 29 mg/dL — ABNORMAL LOW (ref >39)
LDL Calculated: 56 mg/dL (ref 0–99)
Triglycerides: 103 mg/dL (ref 0–149)
VLDL Cholesterol Cal: 21 mg/dL (ref 5–40)

## 2018-12-22 LAB — PSA, TOTAL AND FREE
PSA, Free Pct: 63.3 %
PSA, Free: 0.19 ng/mL
Prostate Specific Ag, Serum: 0.3 ng/mL (ref 0.0–4.0)

## 2018-12-22 LAB — VITAMIN D 25 HYDROXY (VIT D DEFICIENCY, FRACTURES): Vit D, 25-Hydroxy: 48.5 ng/mL (ref 30.0–100.0)

## 2019-01-09 ENCOUNTER — Encounter (HOSPITAL_COMMUNITY): Payer: Self-pay | Admitting: Radiology

## 2019-01-09 ENCOUNTER — Other Ambulatory Visit: Payer: Self-pay

## 2019-01-09 ENCOUNTER — Ambulatory Visit (HOSPITAL_COMMUNITY)
Admission: RE | Admit: 2019-01-09 | Discharge: 2019-01-09 | Disposition: A | Discharge status: Home / Self Care | Payer: BC Managed Care – PPO | Source: Ambulatory Visit | Attending: Cardiology | Admitting: Cardiology

## 2019-01-09 DIAGNOSIS — I712 Thoracic aortic aneurysm, without rupture: Secondary | ICD-10-CM | POA: Diagnosis not present

## 2019-01-09 DIAGNOSIS — I7121 Aneurysm of the ascending aorta, without rupture: Secondary | ICD-10-CM

## 2019-01-09 MED ORDER — IOHEXOL 350 MG/ML SOLN
100.0000 mL | Freq: Once | INTRAVENOUS | Status: AC | PRN
  Administered 2019-01-09: 100 mL via INTRAVENOUS

## 2019-01-12 ENCOUNTER — Telehealth: Payer: Self-pay

## 2019-01-12 ENCOUNTER — Other Ambulatory Visit: Payer: Self-pay | Admitting: Family Medicine

## 2019-01-12 NOTE — Telephone Encounter (Signed)
Called pt. No answer, left message for pt to return call.  

## 2019-01-12 NOTE — Telephone Encounter (Signed)
-----   Message from Massie Maroon, [place] sent at 01/12/2019  7:40 AM EDT -----  ----- Message ----- From: Arnoldo Lenis, MD Sent: 01/11/2019  10:28 AM EDT To: Massie Maroon, CMA  CT chest looks good, small stable aneurysm. We will continue to monitor   J BrancH MD

## 2019-01-15 ENCOUNTER — Other Ambulatory Visit: Payer: Self-pay | Admitting: Cardiology

## 2019-02-19 ENCOUNTER — Other Ambulatory Visit: Payer: Self-pay | Admitting: Family Medicine

## 2019-03-09 ENCOUNTER — Ambulatory Visit (INDEPENDENT_AMBULATORY_CARE_PROVIDER_SITE_OTHER): Payer: BC Managed Care – PPO | Admitting: Nurse Practitioner

## 2019-03-09 ENCOUNTER — Encounter (INDEPENDENT_AMBULATORY_CARE_PROVIDER_SITE_OTHER): Payer: Self-pay | Admitting: Nurse Practitioner

## 2019-03-09 ENCOUNTER — Other Ambulatory Visit: Payer: Self-pay

## 2019-03-09 DIAGNOSIS — K589 Irritable bowel syndrome without diarrhea: Secondary | ICD-10-CM | POA: Diagnosis not present

## 2019-03-09 MED ORDER — METRONIDAZOLE 250 MG PO TABS: 250.0000 mg | ORAL_TABLET | Freq: Two times a day (BID) | ORAL | 0 refills | Status: DC

## 2019-03-09 NOTE — Progress Notes (Signed)
Subjective:    Patient ID: [name] [name], male    DOB: 03-09-1956, 63 y.o.   MRN: [id number]  HPI Patient is a 63 year old male with a past medical history significant for CAD, AS, AVR 2012, HTN, DM II, OSA on Cpap, bladder cancer 2019, colon polyps and fatty liver. Past cholecystectomy with ERCP with sphincterotomy and stone extraction 11/2016. He presents today for a prescription refill. He reports taking Metronidazole po intermittently for episodes of abdominal bloat discomfort as prescribed by Dr. Karilyn Cota. He stated Metronidazole has been the only treatment that gives him relief during his IBS episodes. He takes Metronidazole 250mg  one tab for 1 or 2 days and his abdominal bloat discomfort abates. He is passing a normal solid stool once daily. Rarely has hemorrhoidal bleeding, last occurred more than 6 months ago. His last colonoscopy was 09/22/2018.   Colonoscopy due to having rectal bleeding and diarrhea 09/22/2018 with Dr. Karilyn Cota: showed diverticulosis in the sigmoid colon and at the hepatic flexure and external and internal hemorrhoids. 10 yr recall.   Colonoscopy 05/11/2011 by Dr. Teena Dunk. Polyp transverse colon and diverticulosis.   Past Medical History:  Diagnosis Date  . Aortic stenosis 8/09   Bicuspid aortic valve  . Arthritis 6/10   hand   . Atrial fibrillation (HCC)    post op only  . Bile duct calculus with acute cholecystitis   . CAD (coronary artery disease) 8/09  . Calculus of common bile duct with acute pancreatitis 11/2016  . CTS (carpal tunnel syndrome)    bilateral   . Diabetes (HCC)   . Diastolic dysfunction 8/09  . Elevated LFTs   . Fatty liver disease, nonalcoholic   . History of kidney stones   . Hyperlipidemia   . Hypertension   . Kidney stones   . Nasal congestion   . NIDDM (non-insulin dependent diabetes mellitus)    x15 yrs  . OSA on CPAP 2006  . Paresthesias in left hand 02/19/2004  . Paresthesias in right hand 02/19/2004  . Sinusitis   . Vitamin D  deficiency 06/13/09   Past Surgical History:  Procedure Laterality Date  . bladder cancer - operation - 01/21/18 - Dr Berneice Heinrich  01/21/2018  . CARPAL TUNNEL RELEASE     bilateral   . CHOLECYSTECTOMY  11/2016  . COLONOSCOPY N/A 09/22/2018   Procedure: COLONOSCOPY;  Surgeon: Malissa Hippo, MD;  [facility];  Service: Endoscopy;  Laterality: N/A;  1:00  . CORONARY ANGIOPLASTY WITH STENT PLACEMENT     to rt coronary atery (Dr. Degert-cardiologist)  . CYSTOSCOPY WITH RETROGRADE PYELOGRAM, URETEROSCOPY AND STENT PLACEMENT Left 03/07/2018   Procedure: CYSTOSCOPY WITH RETROGRADE PYELOGRAM,  AND STENT PLACEMENT;  Surgeon: Sebastian Ache, MD;  Location: [facility];  Service: Urology;  Laterality: Left;  . ERCP N/A 11/24/2016   Procedure: ENDOSCOPIC RETROGRADE CHOLANGIOPANCREATOGRAPHY (ERCP);  Surgeon: Malissa Hippo, MD;  [facility];  Service: Endoscopy;  Laterality: N/A;  . EXTRACORPOREAL SHOCK WAVE LITHOTRIPSY Left 03/17/2018   Procedure: LEFT EXTRACORPOREAL SHOCK WAVE LITHOTRIPSY (ESWL);  Surgeon: Bjorn Pippin, MD;  [facility];  Service: Urology;  Laterality: Left;  . REMOVAL OF STONES N/A 11/24/2016   Procedure: REMOVAL OF STONES;  Surgeon: Malissa Hippo, MD;  [facility];  Service: Endoscopy;  Laterality: N/A;  . SPHINCTEROTOMY N/A 11/24/2016   Procedure: SPHINCTEROTOMY;  Surgeon: Malissa Hippo, MD;  [facility];  Service: Endoscopy;  Laterality: N/A;  . TISSUE AORTIC  VALVE REPLACEMENT     2012   Current Outpatient Medications on File Prior to Visit  Medication Sig Dispense Refill  . ANDROGEL PUMP 20.25 MG/ACT (1.62%) GEL APPLY THREE PUMPS AS DIRECTED - ON NEXT fill, run generic 11/23 ad (Patient taking differently: Apply 3 Pump topically daily. ) 75 g 3  . aspirin EC 81 MG tablet Take 81 mg by mouth daily.    [name] atenolol (TENORMIN) 25 MG tablet TAKE 1 TABLET DAILY 90 tablet 3  . atorvastatin (LIPITOR) 40 MG tablet TAKE 1 TABLET DAILY 90 tablet  1  . cholecalciferol (VITAMIN D) 1000 UNITS tablet Take 2,000 Units by mouth daily.     . fexofenadine (ALLEGRA) 180 MG tablet Take 180 mg by mouth daily.    . fluticasone (FLONASE) 50 MCG/ACT nasal spray Place 2 sprays into the nose daily as needed for allergies.     [name] glipiZIDE (GLUCOTROL) 5 MG tablet Take 2 tablets (10 mg total) by mouth 2 (two) times daily before a meal. 360 tablet 3  . lisinopril (PRINIVIL,ZESTRIL) 20 MG tablet TAKE 1 TABLET DAILY 90 tablet 4  . metFORMIN (GLUCOPHAGE) 1000 MG tablet TAKE 1 TABLET TWICE DAILY WITH MEALS (Patient taking differently: Take 1,000 mg by mouth 2 (two) times daily with a meal. ) 180 tablet 4  . promethazine (PHENERGAN) 25 MG tablet TAKE 1 TABLET EVERY 4 HOURS AS NEEDED 30 tablet 2  . RYBELSUS 3 MG TABS TAKE 1 TABLET BY MOUTH EVERY MORNING 30 MINUTES BEFORE FIRST FOOD, BEVERAGE, OR MEDICATION OF THE DAY. TAKE WITH FOUR OUNCES OF WATER. EAT WITHIN 30-60 MINUTES AFTER. 30 tablet 2  . Simethicone (GAS-X PO) Take 1 capsule by mouth daily.    . temazepam (RESTORIL) 15 MG capsule TAKE 1 CAPSULE BY MOUTH AT BEDTIME AS NEEDED (Patient taking differently: Take 15 mg by mouth at bedtime as needed for sleep. TAKE 1 CAPSULE BY MOUTH AT BEDTIME AS NEEDED) 90 capsule 0   No current facility-administered medications on file prior to visit.    Allergies  Allergen Reactions  . Glyxambi [Empagliflozin-Linagliptin] Diarrhea and Itching    He was able to take Jardiance alone with no side effects so it was the Tradjenta part that most likely gave him the reaction  . Hctz [Hydrochlorothiazide]     Nausea and headache   . Sulfa Antibiotics Nausea And Vomiting  . Sulfonamide Derivatives       Objective:   Physical Exam  BP 112/67   Pulse 65   Temp 98.6 F (37 C) (Oral)   Ht 6' (1.829 m)   Wt 206 lb 11.2 oz (93.8 kg)   BMI 28.03 kg/m   General: 63 year old male in NAD Eyes: sclera nonicteric. Heart: RRR, soft murmur Lungs: clear throughout  Abdomen: soft,  nontender, no masses or organomegaly Extremities: no edema    Assessment & Plan:   1. 63 y.o. male with episodes of abdominal bloat/gas discomfort which abates after taking Metronidazole PRN -I refilled Metronidazole 250mg  one tab po bid x 7 days with next episode of abdominal gas bloat, no alcohol use while taking Metronidazole discussed with patient -Further metronidazole refills to be provided by Dr. Laural Golden -Ibgard 1 po bid  -Discussed a trial with Xifaxan for future episodes of abdominal gas bloat -Patient to call office if his sx worsen -Follow up in office in 1 year and as needed  2. Hemorrhoids with infrequent bleeding, recent colonoscopy -patient to follow up in office if rectal bleeding  recurs

## 2019-03-09 NOTE — Patient Instructions (Signed)
1. Your Prescription for Metronidazole was renewed  2. Call our office if you are having more frequent episodes of abdominal bloat  3. Try Ibgard (peppermint oil) one capsule by mouth twice daily for abdominal bloat, irritable bowel symptoms   4. Follow up in office in 1 year

## 2019-03-27 ENCOUNTER — Other Ambulatory Visit: Payer: Self-pay | Admitting: Family Medicine

## 2019-04-15 ENCOUNTER — Other Ambulatory Visit: Payer: Self-pay | Admitting: Family Medicine

## 2019-04-17 NOTE — Telephone Encounter (Signed)
Testosterone is something that has to be refilled in a visit because it is controlled, he has a visit in 3 days so we will do the refill then

## 2019-04-19 ENCOUNTER — Other Ambulatory Visit: Payer: Self-pay

## 2019-04-20 ENCOUNTER — Ambulatory Visit (INDEPENDENT_AMBULATORY_CARE_PROVIDER_SITE_OTHER): Payer: BC Managed Care – PPO | Admitting: Family Medicine

## 2019-04-20 ENCOUNTER — Encounter: Payer: Self-pay | Admitting: Family Medicine

## 2019-04-20 VITALS — BP 135/72 | HR 63 | Temp 96.2°F | Ht 73.0 in | Wt 206.4 lb

## 2019-04-20 DIAGNOSIS — Z23 Encounter for immunization: Secondary | ICD-10-CM | POA: Diagnosis not present

## 2019-04-20 DIAGNOSIS — E785 Hyperlipidemia, unspecified: Secondary | ICD-10-CM

## 2019-04-20 DIAGNOSIS — I10 Essential (primary) hypertension: Secondary | ICD-10-CM | POA: Diagnosis not present

## 2019-04-20 DIAGNOSIS — E1169 Type 2 diabetes mellitus with other specified complication: Secondary | ICD-10-CM

## 2019-04-20 DIAGNOSIS — K219 Gastro-esophageal reflux disease without esophagitis: Secondary | ICD-10-CM

## 2019-04-20 LAB — BAYER DCA HB A1C WAIVED: HB A1C (BAYER DCA - WAIVED): 9.7 % — ABNORMAL HIGH (ref <7.0)

## 2019-04-20 MED ORDER — TESTOSTERONE 20.25 MG/ACT (1.62%) TD GEL: 3.0000 | Freq: Every day | TRANSDERMAL | 2 refills | Status: DC

## 2019-04-20 MED ORDER — RYBELSUS 7 MG PO TABS: 7.0000 mg | ORAL_TABLET | Freq: Every day | ORAL | 5 refills | Status: DC

## 2019-04-20 NOTE — Addendum Note (Signed)
Addended by: Nigel Berthold C on: 04/20/2019 09:55 AM   Modules accepted: Orders

## 2019-04-20 NOTE — Progress Notes (Signed)
BP 135/72   Pulse 63   Temp (!) 96.2 F (35.7 C) (Temporal)   Ht 6\' 1"  (1.854 m)   Wt 206 lb 6.4 oz (93.6 kg)   SpO2 99%   BMI 27.23 kg/m    Subjective:   Patient ID: [name] [name], male    DOB: 31-Aug-1955, 63 y.o.   MRN: [id number]  HPI: [name] [name] is a 63 y.o. male presenting on 04/20/2019 for Establish Care (DWM) and Diabetes (4 month follow up)   HPI Type 2 diabetes mellitus Patient comes in today for recheck of his diabetes. Patient has been currently taking glipizide and metformin and rybelsus. Patient is currently on an ACE inhibitor/ARB. Patient has not seen an ophthalmologist this year. Patient denies any issues with their feet.   Hypertension Patient is currently on lisinopril and atenolol, and their blood pressure today is 135/72. Patient denies any lightheadedness or dizziness. Patient denies headaches, blurred vision, chest pains, shortness of breath, or weakness. Denies any side effects from medication and is content with current medication.   Hyperlipidemia Patient is coming in for recheck of his hyperlipidemia. The patient is currently taking atorvastatin. They deny any issues with myalgias or history of liver damage from it. They deny any focal numbness or weakness or chest pain.   GERD Patient is currently on takes simethicone and occasional metronidazole.  She denies any major symptoms or abdominal pain or belching or burping. She denies any blood in her stool or lightheadedness or dizziness.   Relevant past medical, surgical, family and social history reviewed and updated as indicated. Interim medical history since our last visit reviewed. Allergies and medications reviewed and updated.  Review of Systems  Constitutional: Negative for chills and fever.  Eyes: Negative for visual disturbance.  Respiratory: Negative for shortness of breath and wheezing.   Cardiovascular: Negative for chest pain and leg swelling.  Musculoskeletal: Negative for back pain  and gait problem.  Skin: Negative for rash.  Neurological: Negative for dizziness, weakness and light-headedness.  All other systems reviewed and are negative.   Per HPI unless specifically indicated above   Allergies as of 04/20/2019      Reactions   Glyxambi [empagliflozin-linagliptin] Diarrhea, Itching   [name] [name] was able to take Jardiance alone with no side effects so it was the Tradjenta part that most likely gave him the reaction   Hctz [hydrochlorothiazide]    Nausea and headache    Sulfa Antibiotics Nausea And Vomiting   Sulfonamide Derivatives       Medication List       Accurate as of April 20, 2019  9:02 AM. If you have any questions, ask your nurse or doctor.        STOP taking these medications   Rybelsus 3 MG Tabs Generic drug: Semaglutide Stopped by: Fransisca Kaufmann Dettinger, MD     TAKE these medications   AndroGel Pump 20.25 MG/ACT (1.62%) Gel Generic drug: Testosterone APPLY THREE PUMPS AS DIRECTED - ON NEXT fill, run generic 11/23 ad What changed: See the new instructions.   aspirin EC 81 MG tablet Take 81 mg by mouth daily.   atenolol 25 MG tablet Commonly known as: TENORMIN TAKE 1 TABLET DAILY   atorvastatin 40 MG tablet Commonly known as: LIPITOR TAKE 1 TABLET DAILY   cholecalciferol 1000 units tablet Commonly known as: VITAMIN D Take 2,000 Units by mouth daily.   fexofenadine 180 MG tablet Commonly known as: ALLEGRA Take 180 mg by mouth daily.  fluticasone 50 MCG/ACT nasal spray Commonly known as: FLONASE Place 2 sprays into the nose daily as needed for allergies.   GAS-X PO Take 1 capsule by mouth daily.   glipiZIDE 5 MG tablet Commonly known as: GLUCOTROL Take 2 tablets (10 mg total) by mouth 2 (two) times daily before a meal.   lisinopril 20 MG tablet Commonly known as: ZESTRIL TAKE 1 TABLET DAILY   metFORMIN 1000 MG tablet Commonly known as: GLUCOPHAGE TAKE 1 TABLET TWICE DAILY WITH MEALS   metroNIDAZOLE 250 MG tablet  Commonly known as: FLAGYL Take 1 tablet (250 mg total) by mouth 2 (two) times daily.   promethazine 25 MG tablet Commonly known as: PHENERGAN TAKE 1 TABLET EVERY 4 HOURS AS NEEDED   temazepam 15 MG capsule Commonly known as: RESTORIL TAKE ONE CAPSULE BY MOUTH AT BEDTIME AS NEEDED        Objective:   BP 135/72   Pulse 63   Temp (!) 96.2 F (35.7 C) (Temporal)   Ht 6\' 1"  (1.854 m)   Wt 206 lb 6.4 oz (93.6 kg)   SpO2 99%   BMI 27.23 kg/m   Wt Readings from Last 3 Encounters:  04/20/19 206 lb 6.4 oz (93.6 kg)  03/09/19 206 lb 11.2 oz (93.8 kg)  10/26/18 204 lb (92.5 kg)    Physical Exam Vitals signs and nursing note reviewed.  Constitutional:      General: [name] [name] is not in acute distress.    Appearance: [name] [name] is well-developed. [name] [name] is not diaphoretic.  Eyes:     General: No scleral icterus.    Conjunctiva/sclera: Conjunctivae normal.  Neck:     Musculoskeletal: Neck supple.     Thyroid: No thyromegaly.  Cardiovascular:     Rate and Rhythm: Normal rate and regular rhythm.     Heart sounds: Normal heart sounds. No murmur.  Pulmonary:     Effort: Pulmonary effort is normal. No respiratory distress.     Breath sounds: Normal breath sounds. No wheezing.  Musculoskeletal: Normal range of motion.  Lymphadenopathy:     Cervical: No cervical adenopathy.  Skin:    General: Skin is warm and dry.     Findings: No rash.  Neurological:     Mental Status: [name] [name] is alert and oriented to person, place, and time.     Coordination: Coordination normal.  Psychiatric:        Behavior: Behavior normal.       Assessment & Plan:   Problem List Items Addressed This Visit      Cardiovascular and Mediastinum   Essential hypertension     Digestive   GERD (gastroesophageal reflux disease)     Endocrine   Type 2 diabetes mellitus with hyperlipidemia (HCC) - Primary   Relevant Medications   Semaglutide (RYBELSUS) 7 MG TABS   Other Relevant Orders   Bayer DCA Hb A1c Waived      Other   Hyperlipidemia with target LDL less than 70      Patient's a1c is 9.7 and blood sugars are between 130-340 and mostly in 200's Follow up plan: Return in about 3 months (around 07/21/2019), or if symptoms worsen or fail to improve, for type 2 diabetes.  Counseling provided for all of the vaccine components Orders Placed This Encounter  Procedures  . Bayer [facility] Hb A1c Waived    [facility], MD [facility] 04/20/2019, 9:02 AM

## 2019-04-22 ENCOUNTER — Other Ambulatory Visit: Payer: Self-pay | Admitting: Family Medicine

## 2019-05-11 DIAGNOSIS — R351 Nocturia: Secondary | ICD-10-CM | POA: Diagnosis not present

## 2019-05-11 DIAGNOSIS — N200 Calculus of kidney: Secondary | ICD-10-CM | POA: Diagnosis not present

## 2019-05-16 DIAGNOSIS — H35033 Hypertensive retinopathy, bilateral: Secondary | ICD-10-CM | POA: Diagnosis not present

## 2019-05-24 ENCOUNTER — Other Ambulatory Visit: Payer: Self-pay | Admitting: Family Medicine

## 2019-06-02 ENCOUNTER — Encounter: Payer: Self-pay | Admitting: Family Medicine

## 2019-06-05 ENCOUNTER — Telehealth: Payer: Self-pay | Admitting: Family Medicine

## 2019-06-05 MED ORDER — RYBELSUS 14 MG PO TABS: 14.0000 mg | ORAL_TABLET | Freq: Every day | ORAL | 5 refills | Status: DC

## 2019-06-05 NOTE — Telephone Encounter (Signed)
I have increased the Rybelsus from 7 mg to 14 mg to see if we can get better hyperglycemic control, if he has abdominal issues with this then let me know, he can go ahead and double any sevens that he has left. Caryl Pina, MD [facility] 06/05/2019, 8:48 AM

## 2019-06-05 NOTE — Telephone Encounter (Signed)
Patient was notified by email in [place]

## 2019-06-06 ENCOUNTER — Other Ambulatory Visit (INDEPENDENT_AMBULATORY_CARE_PROVIDER_SITE_OTHER): Payer: Self-pay | Admitting: Nurse Practitioner

## 2019-06-06 ENCOUNTER — Encounter (INDEPENDENT_AMBULATORY_CARE_PROVIDER_SITE_OTHER): Payer: Self-pay | Admitting: *Deleted

## 2019-06-07 ENCOUNTER — Telehealth: Payer: Self-pay

## 2019-06-07 NOTE — Telephone Encounter (Signed)
Prior Auth - Testosterone 1.62% Gel Paper filled out and given to provider to sign.

## 2019-06-12 NOTE — Telephone Encounter (Signed)
Form faxed

## 2019-06-13 ENCOUNTER — Other Ambulatory Visit (INDEPENDENT_AMBULATORY_CARE_PROVIDER_SITE_OTHER): Payer: Self-pay

## 2019-06-14 MED ORDER — METRONIDAZOLE 250 MG PO TABS: 250.0000 mg | ORAL_TABLET | Freq: Three times a day (TID) | ORAL | 0 refills | Status: DC

## 2019-06-14 NOTE — Telephone Encounter (Signed)
I will need to see him at the office before another prescription of metronidazole or any other antibiotic given after this month

## 2019-06-15 NOTE — Telephone Encounter (Signed)
Prior Auth for Testosterone 1.62% Gel-APPROVED till 06/13/2020  Pharmacy notified.

## 2019-07-28 ENCOUNTER — Encounter: Payer: Self-pay | Admitting: Family Medicine

## 2019-07-28 ENCOUNTER — Other Ambulatory Visit: Payer: Self-pay

## 2019-07-28 ENCOUNTER — Ambulatory Visit (INDEPENDENT_AMBULATORY_CARE_PROVIDER_SITE_OTHER): Payer: BC Managed Care – PPO | Admitting: Family Medicine

## 2019-07-28 DIAGNOSIS — E785 Hyperlipidemia, unspecified: Secondary | ICD-10-CM

## 2019-07-28 DIAGNOSIS — G4709 Other insomnia: Secondary | ICD-10-CM | POA: Diagnosis not present

## 2019-07-28 DIAGNOSIS — I10 Essential (primary) hypertension: Secondary | ICD-10-CM | POA: Diagnosis not present

## 2019-07-28 DIAGNOSIS — E1169 Type 2 diabetes mellitus with other specified complication: Secondary | ICD-10-CM | POA: Diagnosis not present

## 2019-07-28 LAB — BAYER DCA HB A1C WAIVED: HB A1C (BAYER DCA - WAIVED): 10.4 % — ABNORMAL HIGH (ref <7.0)

## 2019-07-28 MED ORDER — TOUJEO SOLOSTAR 300 UNIT/ML ~~LOC~~ SOPN: 10.0000 [IU] | PEN_INJECTOR | Freq: Every day | SUBCUTANEOUS | 3 refills | Status: DC

## 2019-07-28 MED ORDER — BD PEN NEEDLE MINI U/F 31G X 5 MM MISC: 1.0000 | Freq: Every day | 3 refills | Status: DC

## 2019-07-28 MED ORDER — TEMAZEPAM 15 MG PO CAPS: ORAL_CAPSULE | ORAL | 1 refills | Status: DC

## 2019-07-28 NOTE — Progress Notes (Signed)
Virtual Visit via telephone Note  I connected with [name] [name] on 07/28/19 at [phone] by telephone and verified that I am speaking with the correct person using two identifiers. [name] [name] is currently located at home and no other people are currently with her during visit. The provider, Fransisca Kaufmann Arcangel Minion, MD is located in their office at time of visit.  Call ended at [phone]  I discussed the limitations, risks, security and privacy concerns of performing an evaluation and management service by telephone and the availability of in person appointments. I also discussed with the patient that there may be a patient responsible charge related to this service. The patient expressed understanding and agreed to proceed.   History and Present Illness: Type 2 diabetes mellitus Patient comes in today for recheck of his diabetes. Patient has been currently taking glipizide and metformin and rybelsus. Patient is currently on an ACE inhibitor/ARB. Patient has not seen an ophthalmologist this year. Patient denies any issues with their feet. Average am is 183 and 2 hour post meal 206  Hypertension Patient is currently on lisinopril and atenolol, and their blood pressure today is 130/76. Patient denies any lightheadedness or dizziness. Patient denies headaches, blurred vision, chest pains, shortness of breath, or weakness. Denies any side effects from medication and is content with current medication.   Hyperlipidemia Patient is coming in for recheck of his hyperlipidemia. The patient is currently taking atorvastatin. They deny any issues with myalgias or history of liver damage from it. They deny any focal numbness or weakness or chest pain.   Insomnia Temazepam is what patient takes and occasionally takes temazepam Current rx- temazepam nightly as needed # meds rx-30 Effectiveness of current meds-works well, he does not have to use it every night but only uses it every now and then Adverse reactions  form meds-none  Pill count performed-No Last drug screen -N/A ( high risk q74m moderate risk q667mlow risk yearly ) Urine drug screen today- No [place]  If yes were their any concerning findings? -None  No flowsheet data found.   Controlled substance contract signed on: N/A   No diagnosis found.  Outpatient Encounter Medications as of 07/28/2019  Medication Sig  . aspirin EC 81 MG tablet Take 81 mg by mouth daily.  . Marland Kitchentenolol (TENORMIN) 25 MG tablet TAKE 1 TABLET DAILY  . atorvastatin (LIPITOR) 40 MG tablet TAKE 1 TABLET DAILY  . cholecalciferol (VITAMIN D) 1000 UNITS tablet Take 2,000 Units by mouth daily.   . fexofenadine (ALLEGRA) 180 MG tablet Take 180 mg by mouth daily.  . fluticasone (FLONASE) 50 MCG/ACT nasal spray Place 2 sprays into the nose daily as needed for allergies.   . Marland KitchenlipiZIDE (GLUCOTROL) 5 MG tablet Take 2 tablets (10 mg total) by mouth 2 (two) times daily before a meal.  . lisinopril (PRINIVIL,ZESTRIL) 20 MG tablet TAKE 1 TABLET DAILY  . metFORMIN (GLUCOPHAGE) 1000 MG tablet TAKE 1 TABLET TWICE DAILY WITH MEALS (Patient taking differently: Take 1,000 mg by mouth 2 (two) times daily with a meal. )  . metroNIDAZOLE (FLAGYL) 250 MG tablet Take 1 tablet (250 mg total) by mouth 3 (three) times daily.  . promethazine (PHENERGAN) 25 MG tablet TAKE 1 TABLET EVERY 4 HOURS AS NEEDED  . Semaglutide (RYBELSUS) 14 MG TABS Take 14 mg by mouth daily.  . Simethicone (GAS-X PO) Take 1 capsule by mouth daily.  . temazepam (RESTORIL) 15 MG capsule TAKE ONE CAPSULE BY MOUTH AT  BEDTIME AS NEEDED  . Testosterone (ANDROGEL PUMP) 20.25 MG/ACT (1.62%) GEL Apply 3 Pump topically daily.   No facility-administered encounter medications on file as of 07/28/2019.    Review of Systems  Constitutional: Negative for chills and fever.  Respiratory: Negative for shortness of breath and wheezing.   Cardiovascular: Negative for chest pain and leg swelling.  Musculoskeletal:  Negative for back pain and gait problem.  Skin: Negative for rash.  Neurological: Negative for weakness, light-headedness and numbness.  Psychiatric/Behavioral: Positive for sleep disturbance. Negative for self-injury and suicidal ideas. The patient is not nervous/anxious.   All other systems reviewed and are negative.   Observations/Objective:   Assessment and Plan: Problem List Items Addressed This Visit      Cardiovascular and Mediastinum   Essential hypertension   Relevant Orders   CMP14+EGFR     Endocrine   Type 2 diabetes mellitus with hyperlipidemia ([place]) - Primary   Relevant Medications   Insulin Glargine, 1 Unit Dial, (TOUJEO SOLOSTAR) 300 UNIT/ML SOPN   Insulin Pen Needle (B-D UF III MINI PEN NEEDLES) 31G X 5 MM MISC   Other Relevant Orders   Bayer DCA Hb A1c Waived   CMP14+EGFR     Other   Hyperlipidemia with target LDL less than 70   INSOMNIA      Patient is on Rybelsus and will start Toujeo, continue Metformin and glipizide for now, will start Toujeo at 10 units Follow up plan: No follow-ups on file.     I discussed the assessment and treatment plan with the patient. The patient was provided an opportunity to ask questions and all were answered. The patient agreed with the plan and demonstrated an understanding of the instructions.   The patient was advised to call back or seek an in-person evaluation if the symptoms worsen or if the condition fails to improve as anticipated.  The above assessment and management plan was discussed with the patient. The patient verbalized understanding of and has agreed to the management plan. Patient is aware to call the clinic if symptoms persist or worsen. Patient is aware when to return to the clinic for a follow-up visit. Patient educated on when it is appropriate to go to the emergency department.    I provided 21 minutes of non-face-to-face time during this encounter.    Worthy Rancher, MD

## 2019-07-29 LAB — CMP14+EGFR
ALT: 66 IU/L — ABNORMAL HIGH (ref 0–44)
AST: 46 IU/L — ABNORMAL HIGH (ref 0–40)
Albumin/Globulin Ratio: 2 (ref 1.2–2.2)
Albumin: 4.9 g/dL — ABNORMAL HIGH (ref 3.8–4.8)
Alkaline Phosphatase: 91 IU/L (ref 39–117)
BUN/Creatinine Ratio: 16 (ref 10–24)
BUN: 24 mg/dL (ref 8–27)
Bilirubin Total: 0.4 mg/dL (ref 0.0–1.2)
CO2: 18 mmol/L — ABNORMAL LOW (ref 20–29)
Calcium: 9.9 mg/dL (ref 8.6–10.2)
Chloride: 103 mmol/L (ref 96–106)
Creatinine, Ser: 1.46 mg/dL — ABNORMAL HIGH (ref 0.76–1.27)
GFR calc Af Amer: 58 mL/min/{1.73_m2} — ABNORMAL LOW (ref >59)
GFR calc non Af Amer: 50 mL/min/{1.73_m2} — ABNORMAL LOW (ref >59)
Globulin, Total: 2.5 g/dL (ref 1.5–4.5)
Glucose: 210 mg/dL — ABNORMAL HIGH (ref 65–99)
Potassium: 4.9 mmol/L (ref 3.5–5.2)
Sodium: 140 mmol/L (ref 134–144)
Total Protein: 7.4 g/dL (ref 6.0–8.5)

## 2019-08-08 ENCOUNTER — Other Ambulatory Visit: Payer: Self-pay | Admitting: Family Medicine

## 2019-08-08 ENCOUNTER — Other Ambulatory Visit: Payer: BC Managed Care – PPO

## 2019-08-08 DIAGNOSIS — I10 Essential (primary) hypertension: Secondary | ICD-10-CM | POA: Diagnosis not present

## 2019-08-08 DIAGNOSIS — I482 Chronic atrial fibrillation, unspecified: Secondary | ICD-10-CM

## 2019-08-08 NOTE — Progress Notes (Signed)
Placed lab orders for patient 

## 2019-08-09 LAB — CBC WITH DIFFERENTIAL/PLATELET
Basophils Absolute: 0.1 10*3/uL (ref 0.0–0.2)
Basos: 1 %
EOS (ABSOLUTE): 0.2 10*3/uL (ref 0.0–0.4)
Eos: 3 %
Hematocrit: 40.5 % (ref 37.5–51.0)
Hemoglobin: 13.3 g/dL (ref 13.0–17.7)
Immature Grans (Abs): 0 10*3/uL (ref 0.0–0.1)
Immature Granulocytes: 0 %
Lymphocytes Absolute: 1.6 10*3/uL (ref 0.7–3.1)
Lymphs: 26 %
MCH: 31.4 pg (ref 26.6–33.0)
MCHC: 32.8 g/dL (ref 31.5–35.7)
MCV: 96 fL (ref 79–97)
Monocytes Absolute: 0.5 10*3/uL (ref 0.1–0.9)
Monocytes: 8 %
Neutrophils Absolute: 3.8 10*3/uL (ref 1.4–7.0)
Neutrophils: 62 %
Platelets: 217 10*3/uL (ref 150–450)
RBC: 4.23 x10E6/uL (ref 4.14–5.80)
RDW: 12.7 % (ref 11.6–15.4)
WBC: 6.2 10*3/uL (ref 3.4–10.8)

## 2019-08-09 LAB — BMP8+EGFR
BUN/Creatinine Ratio: 16 (ref 10–24)
BUN: 18 mg/dL (ref 8–27)
CO2: 20 mmol/L (ref 20–29)
Calcium: 9.1 mg/dL (ref 8.6–10.2)
Chloride: 104 mmol/L (ref 96–106)
Creatinine, Ser: 1.11 mg/dL (ref 0.76–1.27)
GFR calc Af Amer: 81 mL/min/{1.73_m2} (ref >59)
GFR calc non Af Amer: 70 mL/min/{1.73_m2} (ref >59)
Glucose: 203 mg/dL — ABNORMAL HIGH (ref 65–99)
Potassium: 4.5 mmol/L (ref 3.5–5.2)
Sodium: 141 mmol/L (ref 134–144)

## 2019-08-09 LAB — LIPID PANEL
Chol/HDL Ratio: 3.4 ratio (ref 0.0–5.0)
Cholesterol, Total: 93 mg/dL — ABNORMAL LOW (ref 100–199)
HDL: 27 mg/dL — ABNORMAL LOW (ref >39)
LDL Chol Calc (NIH): 47 mg/dL (ref 0–99)
Triglycerides: 96 mg/dL (ref 0–149)
VLDL Cholesterol Cal: 19 mg/dL (ref 5–40)

## 2019-08-10 ENCOUNTER — Telehealth: Payer: Self-pay | Admitting: Family Medicine

## 2019-08-10 ENCOUNTER — Other Ambulatory Visit: Payer: Self-pay | Admitting: Family Medicine

## 2019-08-10 NOTE — Telephone Encounter (Signed)
Pt called stating that he received missed call about his lab results. Went over results with pt per Dr Oris Drone notes. Pt now aware that results look good but  to work on lowering blood sugar and will follow up at next visit.

## 2019-08-13 ENCOUNTER — Encounter: Payer: Self-pay | Admitting: Family Medicine

## 2019-08-26 ENCOUNTER — Other Ambulatory Visit (INDEPENDENT_AMBULATORY_CARE_PROVIDER_SITE_OTHER): Payer: Self-pay | Admitting: Internal Medicine

## 2019-09-19 DIAGNOSIS — Z23 Encounter for immunization: Secondary | ICD-10-CM | POA: Diagnosis not present

## 2019-09-24 ENCOUNTER — Other Ambulatory Visit: Payer: Self-pay | Admitting: Family Medicine

## 2019-09-25 ENCOUNTER — Other Ambulatory Visit: Payer: Self-pay | Admitting: *Deleted

## 2019-09-25 DIAGNOSIS — I6521 Occlusion and stenosis of right carotid artery: Secondary | ICD-10-CM

## 2019-09-26 ENCOUNTER — Other Ambulatory Visit: Payer: Self-pay

## 2019-09-26 ENCOUNTER — Ambulatory Visit (HOSPITAL_COMMUNITY)
Admission: RE | Admit: 2019-09-26 | Discharge: 2019-09-26 | Disposition: A | Discharge status: Home / Self Care | Payer: BC Managed Care – PPO | Source: Ambulatory Visit | Attending: Vascular Surgery | Admitting: Vascular Surgery

## 2019-09-26 ENCOUNTER — Ambulatory Visit (HOSPITAL_COMMUNITY): Payer: BC Managed Care – PPO

## 2019-09-26 ENCOUNTER — Encounter: Payer: Self-pay | Admitting: Vascular Surgery

## 2019-09-26 ENCOUNTER — Ambulatory Visit (INDEPENDENT_AMBULATORY_CARE_PROVIDER_SITE_OTHER): Payer: BC Managed Care – PPO | Admitting: Vascular Surgery

## 2019-09-26 DIAGNOSIS — I6521 Occlusion and stenosis of right carotid artery: Secondary | ICD-10-CM

## 2019-09-26 NOTE — Progress Notes (Signed)
Virtual Visit via Telephone Note    I connected with [name] [name] on 09/26/2019  by telephone and verified that I was speaking with the correct person using two identifiers. Patient was located at home and accompanied by no one. I am located at [facility].   The limitations of evaluation and management by telemedicine and the availability of in person appointments have been previously discussed with the patient and are documented in the patients chart. The patient expressed understanding and consented to proceed.  PCP: Dettinger, [name], MD   Chief Complaint: Asymptomatic carotid stenosis  History of Present Illness: [name] [name] is a 64 y.o. male with who was seen a year ago with asymptomatic carotid disease.  He underwent repeat carotid duplex for follow-up.  He has no symptoms of carotid disease.  Specifically no amaurosis fugax, transient transient ischemic attack or stroke  Past Medical History:  Diagnosis Date  . Aortic stenosis 8/09   Bicuspid aortic valve  . Arthritis 6/10   hand   . Atrial fibrillation (HCC)    post op only  . Bile duct calculus with acute cholecystitis   . CAD (coronary artery disease) 8/09  . Calculus of common bile duct with acute pancreatitis 11/2016  . CTS (carpal tunnel syndrome)    bilateral   . Diabetes (HCC)   . Diastolic dysfunction 8/09  . Elevated LFTs   . Fatty liver disease, nonalcoholic   . History of kidney stones   . Hyperlipidemia   . Hypertension   . Kidney stones   . Nasal congestion   . NIDDM (non-insulin dependent diabetes mellitus)    x15 yrs  . OSA on CPAP 2006  . Paresthesias in left hand 02/19/2004  . Paresthesias in right hand 02/19/2004  . Sinusitis   . Vitamin D deficiency 06/13/09    Past Surgical History:  Procedure Laterality Date  . bladder cancer - operation - 01/21/18 - Dr Berneice Heinrich  01/21/2018  . CARPAL TUNNEL RELEASE     bilateral   . CHOLECYSTECTOMY  11/2016  . COLONOSCOPY N/A 09/22/2018   Procedure: COLONOSCOPY;  Surgeon: Malissa Hippo, MD;  Location: [facility];  Service: Endoscopy;  Laterality: N/A;  1:00  . CORONARY ANGIOPLASTY WITH STENT PLACEMENT     to rt coronary atery (Dr. Degert-cardiologist)  . CYSTOSCOPY WITH RETROGRADE PYELOGRAM, URETEROSCOPY AND STENT PLACEMENT Left 03/07/2018   Procedure: CYSTOSCOPY WITH RETROGRADE PYELOGRAM,  AND STENT PLACEMENT;  Surgeon: Sebastian Ache, MD;  Location: [facility];  Service: Urology;  Laterality: Left;  . ERCP N/A 11/24/2016   Procedure: ENDOSCOPIC RETROGRADE CHOLANGIOPANCREATOGRAPHY (ERCP);  Surgeon: Malissa Hippo, MD;  Location: [facility];  Service: Endoscopy;  Laterality: N/A;  . EXTRACORPOREAL SHOCK WAVE LITHOTRIPSY Left 03/17/2018   Procedure: LEFT EXTRACORPOREAL SHOCK WAVE LITHOTRIPSY (ESWL);  Surgeon: Bjorn Pippin, MD;  Location: [facility];  Service: Urology;  Laterality: Left;  . REMOVAL OF STONES N/A 11/24/2016   Procedure: REMOVAL OF STONES;  Surgeon: Malissa Hippo, MD;  Location: [facility];  Service: Endoscopy;  Laterality: N/A;  . SPHINCTEROTOMY N/A 11/24/2016   Procedure: SPHINCTEROTOMY;  Surgeon: Malissa Hippo, MD;  Location: [facility];  Service: Endoscopy;  Laterality: N/A;  . TISSUE AORTIC VALVE REPLACEMENT     2012    Current Meds  Medication Sig  . aspirin EC 81 MG tablet Take 81 mg by mouth daily.  [name] atenolol (TENORMIN) 25 MG tablet TAKE 1 TABLET DAILY  .  atorvastatin (LIPITOR) 40 MG tablet TAKE 1 TABLET DAILY  . cholecalciferol (VITAMIN D) 1000 UNITS tablet Take 2,000 Units by mouth daily.   . fexofenadine (ALLEGRA) 180 MG tablet Take 180 mg by mouth daily.  . fluticasone (FLONASE) 50 MCG/ACT nasal spray Place 2 sprays into the nose daily as needed for allergies.   [name] glipiZIDE (GLUCOTROL) 5 MG tablet TAKE 2 TABLETS TWICE A DAY BEFORE MEALS  . Insulin Glargine, 1 Unit Dial, (TOUJEO SOLOSTAR) 300 UNIT/ML SOPN Inject 10-20 Units into the skin daily.  . Insulin Pen Needle (B-D UF III MINI PEN  NEEDLES) 31G X 5 MM MISC 1 each by Does not apply route daily.  [name] lisinopril (PRINIVIL,ZESTRIL) 20 MG tablet TAKE 1 TABLET DAILY  . metFORMIN (GLUCOPHAGE) 1000 MG tablet TAKE 1 TABLET TWICE DAILY WITH MEALS  . metroNIDAZOLE (FLAGYL) 250 MG tablet TAKE 1 TABLET BY MOUTH THREE TIMES DAILY  . promethazine (PHENERGAN) 25 MG tablet TAKE 1 TABLET EVERY 4 HOURS AS NEEDED  . Semaglutide (RYBELSUS) 14 MG TABS Take 14 mg by mouth daily.  . Simethicone (GAS-X PO) Take 1 capsule by mouth daily.  . temazepam (RESTORIL) 15 MG capsule TAKE ONE CAPSULE BY MOUTH AT BEDTIME AS NEEDED  . Testosterone (ANDROGEL PUMP) 20.25 MG/ACT (1.62%) GEL Apply 3 Pump topically daily.    12 system ROS was negative unless otherwise noted in HPI   Observations/Objective: Carotid duplex today reveals no progression.  He does have moderate 40 to 59% internal carotid artery stenosis bilaterally  Assessment and Plan:  Moderate bilateral asymptomatic carotid disease Follow Up Instructions:   Follow up with bilateral carotid duplex in 1 year.  He knows to report immediate to the emergency room should he have any neurologic deficits   I discussed the assessment and treatment plan with the patient. The patient was provided an opportunity to ask questions and all were answered. The patient agreed with the plan and demonstrated an understanding of the instructions.   The patient was advised to call back or seek an in-person evaluation if the symptoms worsen or if the condition fails to improve as anticipated.  I spent 5-10 minutes with the patient via telephone encounter.   Annamary Rummage [facility] Office: [phone]  09/26/2019, 4:06 PM

## 2019-09-28 ENCOUNTER — Telehealth: Payer: Self-pay | Admitting: Cardiology

## 2019-09-28 ENCOUNTER — Other Ambulatory Visit: Payer: Self-pay | Admitting: *Deleted

## 2019-09-28 DIAGNOSIS — I6521 Occlusion and stenosis of right carotid artery: Secondary | ICD-10-CM

## 2019-09-28 NOTE — Telephone Encounter (Signed)
Pt aware that he had CT in July of 2020 and Dr Wyline Mood would address when he would need further testing at his upcoming visit in May

## 2019-09-28 NOTE — Telephone Encounter (Signed)
Patient called wanting to check with Dr. Wyline Mood in regards to scheduling his CT ANGIO CHEST AORTA W &/OR WO CONTRAST he is scheduled to see Dr. Wyline Mood in May.

## 2019-10-09 DIAGNOSIS — Z23 Encounter for immunization: Secondary | ICD-10-CM | POA: Diagnosis not present

## 2019-10-16 ENCOUNTER — Other Ambulatory Visit: Payer: Self-pay

## 2019-10-16 ENCOUNTER — Encounter: Payer: Self-pay | Admitting: Cardiology

## 2019-10-16 ENCOUNTER — Ambulatory Visit (INDEPENDENT_AMBULATORY_CARE_PROVIDER_SITE_OTHER): Payer: BC Managed Care – PPO | Admitting: Cardiology

## 2019-10-16 ENCOUNTER — Telehealth: Payer: Self-pay | Admitting: Cardiology

## 2019-10-16 VITALS — BP 128/70 | HR 79 | Ht 70.0 in | Wt 205.6 lb

## 2019-10-16 DIAGNOSIS — I712 Thoracic aortic aneurysm, without rupture: Secondary | ICD-10-CM | POA: Diagnosis not present

## 2019-10-16 DIAGNOSIS — E782 Mixed hyperlipidemia: Secondary | ICD-10-CM

## 2019-10-16 DIAGNOSIS — I10 Essential (primary) hypertension: Secondary | ICD-10-CM | POA: Diagnosis not present

## 2019-10-16 DIAGNOSIS — I7121 Aneurysm of the ascending aorta, without rupture: Secondary | ICD-10-CM

## 2019-10-16 DIAGNOSIS — Z952 Presence of prosthetic heart valve: Secondary | ICD-10-CM

## 2019-10-16 DIAGNOSIS — Z01812 Encounter for preprocedural laboratory examination: Secondary | ICD-10-CM

## 2019-10-16 NOTE — Patient Instructions (Signed)
Medication Instructions:  Continue all current medications.  Labwork: BMET - do 2-3 days prior to CT   Testing/Procedures:  CTA of chest to follow up aneurysm with contrast - due July   Office will contact with results via phone or letter.    Follow-Up: Your physician wants you to follow up in:  1 year.  You will receive a reminder letter in the mail one-two months in advance.  If you don't receive a letter, please call our office to schedule the follow up appointment   Any Other Special Instructions Will Be Listed Below (If Applicable).  If you need a refill on your cardiac medications before your next appointment, please call your pharmacy.

## 2019-10-16 NOTE — Addendum Note (Signed)
Addended by: Lesle Chris on: 10/16/2019 02:44 PM   Modules accepted: Orders

## 2019-10-16 NOTE — Progress Notes (Signed)
Clinical Summary [name] [name] is a 64 y.o.male  seen today for follow up of the following medical problems    1. AVR - history of bicuspid AV, s/p bioprosthetic AVR in 2012  - echo 11/2016 LVEF 60-65%, no WMAs, normal diastolic function, mild gradient across AVR.  - a stress test wascancelled, as patient was undergoing extensive management for gallstones. Chest pain has resolved with resolution of his GI issues  - no recent SOB or doe. - no recent chest pain.     2. Ascending aortic aneurysm - mild aortic root dilatation at 4.1 to 4.2 cm by last MRI 10/2013, likely related to hx of bicuspid AV and potential coexisting aortopathy - CTA 10/2014 4.1 cm ascending aortic aneurysm.  - CTA 5/2018stable 4 cm aneurysm  10/2017 CTA Chest/Abd/Pelvis Sovah (in epic labeled as CT A/P): 4 cm aneurysm - no recent chest pain  01/2019 CTA stable 3.7 cm(reported overestimated by prior imaging)  3. HTN - on higher dose of lisinopril had symptomatic low bp's - compliant with meds   4. OSA - not compliant with CPAP due to discomfort. Has not been intrested in retyring   5. Hyperlipidemia 08/2019 TC 93 TG 96 HDL 27 LDL 47 - he is compliant with staitn  6. DM2 - followed by pcp    7. Pulmonary Nodules -have been stable by imaging, monitored along with his aneurysm with his annual CT chests  8. Carotid stenosis - followed by vascular 09/2019 40-59% bilaterally - no recent symptoms   Past Medical History:  Diagnosis Date  . Aortic stenosis 8/09   Bicuspid aortic valve  . Arthritis 6/10   hand   . Atrial fibrillation (HCC)    post op only  . Bile duct calculus with acute cholecystitis   . CAD (coronary artery disease) 8/09  . Calculus of common bile duct with acute pancreatitis 11/2016  . CTS (carpal tunnel syndrome)    bilateral   . Diabetes (HCC)   . Diastolic dysfunction 8/09  . Elevated LFTs   . Fatty liver disease, nonalcoholic   . History of kidney  stones   . Hyperlipidemia   . Hypertension   . Kidney stones   . Nasal congestion   . NIDDM (non-insulin dependent diabetes mellitus)    x15 yrs  . OSA on CPAP 2006  . Paresthesias in left hand 02/19/2004  . Paresthesias in right hand 02/19/2004  . Sinusitis   . Vitamin D deficiency 06/13/09     Allergies  Allergen Reactions  . Glyxambi [Empagliflozin-Linagliptin] Diarrhea and Itching    He was able to take Jardiance alone with no side effects so it was the Tradjenta part that most likely gave him the reaction  . Hctz [Hydrochlorothiazide]     Nausea and headache   . Sulfa Antibiotics Nausea And Vomiting  . Sulfonamide Derivatives      Current Outpatient Medications  Medication Sig Dispense Refill  . aspirin EC 81 MG tablet Take 81 mg by mouth daily.    [name] atenolol (TENORMIN) 25 MG tablet TAKE 1 TABLET DAILY 90 tablet 3  . atorvastatin (LIPITOR) 40 MG tablet TAKE 1 TABLET DAILY 90 tablet 1  . cholecalciferol (VITAMIN D) 1000 UNITS tablet Take 2,000 Units by mouth daily.     . fexofenadine (ALLEGRA) 180 MG tablet Take 180 mg by mouth daily.    . fluticasone (FLONASE) 50 MCG/ACT nasal spray Place 2 sprays into the nose daily as needed for allergies.     [name]  glipiZIDE (GLUCOTROL) 5 MG tablet TAKE 2 TABLETS TWICE A DAY BEFORE MEALS 360 tablet 0  . Insulin Glargine, 1 Unit Dial, (TOUJEO SOLOSTAR) 300 UNIT/ML SOPN Inject 10-20 Units into the skin daily. 5 pen 3  . Insulin Pen Needle (B-D UF III MINI PEN NEEDLES) 31G X 5 MM MISC 1 each by Does not apply route daily. 90 each 3  . lisinopril (PRINIVIL,ZESTRIL) 20 MG tablet TAKE 1 TABLET DAILY 90 tablet 4  . metFORMIN (GLUCOPHAGE) 1000 MG tablet TAKE 1 TABLET TWICE DAILY WITH MEALS 180 tablet 0  . metroNIDAZOLE (FLAGYL) 250 MG tablet TAKE 1 TABLET BY MOUTH THREE TIMES DAILY 21 tablet 0  . promethazine (PHENERGAN) 25 MG tablet TAKE 1 TABLET EVERY 4 HOURS AS NEEDED 30 tablet 72  . Semaglutide (RYBELSUS) 14 MG TABS Take 14 mg by mouth daily. 30  tablet 5  . Simethicone (GAS-X PO) Take 1 capsule by mouth daily.    . temazepam (RESTORIL) 15 MG capsule TAKE ONE CAPSULE BY MOUTH AT BEDTIME AS NEEDED 30 capsule 1  . Testosterone (ANDROGEL PUMP) 20.25 MG/ACT (1.62%) GEL Apply 3 Pump topically daily. 75 g 2   No current facility-administered medications for this visit.     Past Surgical History:  Procedure Laterality Date  . bladder cancer - operation - 01/21/18 - Dr Berneice Heinrich  01/21/2018  . CARPAL TUNNEL RELEASE     bilateral   . CHOLECYSTECTOMY  11/2016  . COLONOSCOPY N/A 09/22/2018   Procedure: COLONOSCOPY;  Surgeon: Malissa Hippo, MD;  Location: [facility];  Service: Endoscopy;  Laterality: N/A;  1:00  . CORONARY ANGIOPLASTY WITH STENT PLACEMENT     to rt coronary atery (Dr. Degert-cardiologist)  . CYSTOSCOPY WITH RETROGRADE PYELOGRAM, URETEROSCOPY AND STENT PLACEMENT Left 03/07/2018   Procedure: CYSTOSCOPY WITH RETROGRADE PYELOGRAM,  AND STENT PLACEMENT;  Surgeon: Sebastian Ache, MD;  Location: [facility];  Service: Urology;  Laterality: Left;  . ERCP N/A 11/24/2016   Procedure: ENDOSCOPIC RETROGRADE CHOLANGIOPANCREATOGRAPHY (ERCP);  Surgeon: Malissa Hippo, MD;  Location: [facility];  Service: Endoscopy;  Laterality: N/A;  . EXTRACORPOREAL SHOCK WAVE LITHOTRIPSY Left 03/17/2018   Procedure: LEFT EXTRACORPOREAL SHOCK WAVE LITHOTRIPSY (ESWL);  Surgeon: Bjorn Pippin, MD;  Location: [facility];  Service: Urology;  Laterality: Left;  . REMOVAL OF STONES N/A 11/24/2016   Procedure: REMOVAL OF STONES;  Surgeon: Malissa Hippo, MD;  Location: [facility];  Service: Endoscopy;  Laterality: N/A;  . SPHINCTEROTOMY N/A 11/24/2016   Procedure: SPHINCTEROTOMY;  Surgeon: Malissa Hippo, MD;  Location: [facility];  Service: Endoscopy;  Laterality: N/A;  . TISSUE AORTIC VALVE REPLACEMENT     2012     Allergies  Allergen Reactions  . Glyxambi [Empagliflozin-Linagliptin] Diarrhea and Itching    He was able to take Jardiance alone with no  side effects so it was the Tradjenta part that most likely gave him the reaction  . Hctz [Hydrochlorothiazide]     Nausea and headache   . Sulfa Antibiotics Nausea And Vomiting  . Sulfonamide Derivatives       Family History  Problem Relation Age of Onset  . Hearing loss Mother   . Alzheimer's disease Mother   . Hearing loss Father   . Diabetes Father   . Diabetes Other        Family History      Social History Mr. Dicenzo reports that he quit smoking about 17 years ago. His smoking use included cigarettes. He started smoking  about 45 years ago. He has a 37.50 pack-year smoking history. He has never used smokeless tobacco. Mr. Hayhurst reports no history of alcohol use.   Review of Systems CONSTITUTIONAL: No weight loss, fever, chills, weakness or fatigue.  HEENT: Eyes: No visual loss, blurred vision, double vision or yellow sclerae.No hearing loss, sneezing, congestion, runny nose or sore throat.  SKIN: No rash or itching.  CARDIOVASCULAR: per hpi RESPIRATORY: No shortness of breath, cough or sputum.  GASTROINTESTINAL: No anorexia, nausea, vomiting or diarrhea. No abdominal pain or blood.  GENITOURINARY: No burning on urination, no polyuria NEUROLOGICAL: No headache, dizziness, syncope, paralysis, ataxia, numbness or tingling in the extremities. No change in bowel or bladder control.  MUSCULOSKELETAL: No muscle, back pain, joint pain or stiffness.  LYMPHATICS: No enlarged nodes. No history of splenectomy.  PSYCHIATRIC: No history of depression or anxiety.  ENDOCRINOLOGIC: No reports of sweating, cold or heat intolerance. No polyuria or polydipsia.  [name]   Physical Examination Today's Vitals   10/16/19 1413  BP: 128/70  Pulse: 79  SpO2: 94%  Weight: 205 lb 9.6 oz (93.3 kg)  Height: 5\' 10"  (1.778 m)   Body mass index is 29.5 kg/m.  Gen: resting comfortably, no acute distress HEENT: no scleral icterus, pupils equal round and reactive, no palptable cervical adenopathy,    CV: RRR, 2/6 systolic murmur rusb, no jvd Resp: Clear to auscultation bilaterally GI: abdomen is soft, non-tender, non-distended, normal bowel sounds, no hepatosplenomegaly MSK: extremities are warm, no edema.  Skin: warm, no rash Neuro:  no focal deficits Psych: appropriate affect   Diagnostic Studies 09/2012 MRI/MRA Chest Stable 4 cm ascending thoracic aneurysm  08/2010 Cath HEMODYNAMIC DATA: Right atrial pressures 7/5 with a mean of 4 mmHg,  right ventricular pressure is 24 with an EDP of 7 mmHg. Pulmonary  artery pressure is 20/7 with a mean of 13 mmHg. Pulmonary capillary  wedge pressure is 10/7 with a mean of 6 mmHg. Aortic pressure is 118/67  with a mean of 88 mmHg. Left ventricular pressure is 161 with an EDP of  12 mmHg. There is no significant mitral valve gradient. The aortic  valve mean gradient is 31 mmHg with calculated valve area of 0.8  centimeter squared. Thermodilution cardiac output is 4.4 L per minute  with an index of 2, Fick cardiac outputs 4.7 L per minute with an index  of 2.2.  ANGIOGRAPHIC DATA: The left ventricular angiography was performed in a  RAO view. This demonstrates normal left ventricular size and  contractility with normal systolic function. Ejection fraction is  estimated at 65%. The aortic valve is calcified with eccentric opening  and restrictive mobility.  The left coronary artery arises and distributes normally. The left main  coronary has 20% narrowing in the ostium and in the distal left main.  The left anterior descending artery is a tortuous vessel which has less  than 10% irregularities.  Left circumflex coronary artery is rise to first obtuse marginal vessel  which has a 40-50% stenosis proximally.  The right coronary artery is a dominant vessel, it has 20% disease in  the distal vessel. The stent in the midvessel was widely patent.  FINAL INTERPRETATION:  1. Nonobstructive atherosclerotic coronary artery  disease.  2. Normal left ventricular function.  3. Severe aortic stenosis.  4. Normal right heart pressures.  PLAN: Proceed with aortic valve replacement.  10/2013 MRA Chest EXAM: MRA CHEST WITH OR WITHOUT CONTRAST  TECHNIQUE: Angiographic images of the chest were obtained using  MRA technique without and with intravenous contrast.  CONTRAST: 12mL MULTIHANCE GADOBENATE DIMEGLUMINE 529 MG/ML IV SOLN  COMPARISON: CT UROGRAM dated 05/08/2011; MR MRA CHEST W/ OR W/O CM dated 10/07/2012; CT ANGIO CHEST dated 09/08/2010  FINDINGS: There is stable aneurysmal dilatation of the aortic root that does not involve the sinuses of Valsalva. No aortic dissection is present. Artifact is noted from a prosthetic aortic valve. Diameter at the level of the sinuses of Valsalva is approximately 3.2 cm. Maximal caliber of the ascending thoracic aorta is 4.1- 4.2 cm. The proximal arch measures 3.8 cm. The distal arch measures 2.6 cm. The descending thoracic aorta measures 2.6 cm. Proximal great vessels show stable and normal patency without anatomical variant.  The heart size is normal. No pleural or pericardial fluid is identified. No masses or lymphadenopathy are seen. Visualized spine shows no abnormalities.  IMPRESSION: Stable aneurysmal dilatation of the ascending thoracic aorta measuring 4.1- 4.2 cm in greatest diameter. No dissection is identified.  10/2014 CTA Chest IMPRESSION: 1. Mild fusiform aneurysmal dilatation of the ascending thoracic aorta measuring approximately 41 mm in maximal diameter, stable since the 2012 examination. 2. Stable sequela of prior median sternotomy and aortic valve replacement. 3. Coronary artery calcifications. 4. Punctate (approximately 5 mm) in determine right lower lobe pulmonary nodule, not definitely seen on the 20/2012 examination. If the patient is at high risk for bronchogenic carcinoma, follow-up chest CT at 6-12 months is recommended. If  the patient is at low risk for bronchogenic carcinoma, follow-up chest CT at 12 months is recommended. This recommendation follows the consensus statement:  11/2015 CTA chest  IMPRESSION: Mild aneurysmal dilatation of the ascending aorta with a maximal diameter of 4.0 cm. This is not significantly changed.   11/2016 echo Study Conclusions  - Left ventricle: The cavity size was normal. Wall thickness was normal. Systolic function was normal. The estimated ejection fraction was in the range of 60% to 65%. Wall motion was normal; there were no regional wall motion abnormalities. Left ventricular diastolic function parameters were normal. - Aortic valve: There isa 25-mm Edwards pericardial Magna-Ease valve model #3300TFX in the AV position. Mildly calcified annulus. Trileaflet; normal thickness leaflets. There is a mild gradient across the prosthetic valve. Mean gradient (S): 12 mm Hg. Mean gradient of 18 mmHg with pedhoff probe. Valve area (VTI): 1.71 cm^2. - Technically adequate study.  11/2016 CTA chest IMPRESSION: 1. Stable mild fusiform aneurysm of the ascending aorta, measuring maximum diameter of 4.0 cm. Recommend annual imaging followup by CTA or MRA. This recommendation follows 2010 ACCF/AHA/AATS/ACR/ASA/SCA/SCAI/SIR/STS/SVM Guidelines for the Diagnosis and Management of Patients with Thoracic Aortic Disease. Circulation. 2010; 121: H607-P710 2. Long-term stability of pulmonary nodules, consistent with benign process.   01/2019 Chest CTA IMPRESSION: 1. Stable appearance of ascending thoracic aorta. Maximal measurement is 3.7 cm. This was overestimated on axial imaging previously. Recommend annual imaging followup by CTA or MRA. This recommendation follows 2010 ACCF/AHA/AATS/ACR/ASA/SCA/SCAI/SIR/STS/SVM Guidelines for the Diagnosis and Management of Patients with Thoracic Aortic Disease. Circulation.2010; 121: G269-S854. Aortic aneurysm NOS  (ICD10-I71.9) 2. Stable peripheral pleural based subcentimeter pulmonary nodules, likely inflammatory. No follow-up necessary. 3.  Aortic Atherosclerosis (ICD10-I70.0).  Assessment and Plan   1. Aortic valve replacement - hx of bicuspid AV, s/p bioprosthetic AVR in 2012. Mild gradient across the valve by echo 11/2016 with overall normal function -  no recent symptoms, continue to monitor.    2. Ascending aortic root aneurysm -has been stable with serial imaging - we will repeat CTA this  July  3. HTN - at goal, continue current meds   4. Hyperlipidemia -LDL remains at goal, continue statin  5. Carotid stenosis -mild to moderate bilateral disease - continue medical therapy - continue to follow w vascular  EKG shows SR, no ischemic changes.   F/u 1 year.      [name] PocheJonathan F. Kalley [name], M.D.

## 2019-10-16 NOTE — Telephone Encounter (Signed)
°  Precert needed for: CTA   Location: [facility]    Date: January 15, 2020

## 2019-10-26 ENCOUNTER — Encounter: Payer: Self-pay | Admitting: Family Medicine

## 2019-10-26 ENCOUNTER — Other Ambulatory Visit: Payer: Self-pay

## 2019-10-26 ENCOUNTER — Ambulatory Visit (INDEPENDENT_AMBULATORY_CARE_PROVIDER_SITE_OTHER): Payer: BC Managed Care – PPO | Admitting: Family Medicine

## 2019-10-26 VITALS — BP 134/72 | HR 66 | Temp 97.5°F | Ht 70.0 in | Wt 204.5 lb

## 2019-10-26 DIAGNOSIS — E1169 Type 2 diabetes mellitus with other specified complication: Secondary | ICD-10-CM

## 2019-10-26 DIAGNOSIS — K760 Fatty (change of) liver, not elsewhere classified: Secondary | ICD-10-CM

## 2019-10-26 DIAGNOSIS — E785 Hyperlipidemia, unspecified: Secondary | ICD-10-CM

## 2019-10-26 DIAGNOSIS — K219 Gastro-esophageal reflux disease without esophagitis: Secondary | ICD-10-CM

## 2019-10-26 DIAGNOSIS — I10 Essential (primary) hypertension: Secondary | ICD-10-CM

## 2019-10-26 LAB — BAYER DCA HB A1C WAIVED: HB A1C (BAYER DCA - WAIVED): 9.3 % — ABNORMAL HIGH (ref <7.0)

## 2019-10-26 NOTE — Patient Instructions (Signed)
Increase Toujeo dose to 14 units daily  Having some symptoms of upset stomach from Rybelsus, will continue at current dose and see if tapers off but if does not that we may have to go back down to the 7 mg dose.

## 2019-10-26 NOTE — Progress Notes (Signed)
BP 134/72   Pulse 66   Temp (!) 97.5 F (36.4 C) (Temporal)   Ht 5\' 10"  (1.778 m)   Wt 204 lb 8 oz (92.8 kg)   BMI 29.34 kg/m    Subjective:   Patient ID: [name] [name], male    DOB: Apr 17, 1956, 64 y.o.   MRN: [id number]  HPI: [name] [name] is a 64 y.o. male presenting on 10/26/2019 for Medical Management of Chronic Issues   HPI Type 2 diabetes mellitus Patient comes in today for recheck of his diabetes. Patient has been currently taking Toujeo 12 and Metformin and glipizide and Rybelsus and his blood sugars are averaging in the 160s fasting and the 180s 2 hours after dinner. Patient is currently on an ACE inhibitor/ARB. Patient has not seen an ophthalmologist this year. Patient denies any issues with their feet.   Hypertension Patient is currently on lisinopril and atenolol, and their blood pressure today is 134/72. Patient denies any lightheadedness or dizziness. Patient denies headaches, blurred vision, chest pains, shortness of breath, or weakness. Denies any side effects from medication and is content with current medication.   Hyperlipidemia Patient is coming in for recheck of his hyperlipidemia. The patient is currently taking atorvastatin. They deny any issues with myalgias or history of liver damage from it. They deny any focal numbness or weakness or chest pain.   GERD Patient is currently on no medication currently for his stomach although he does take simethicone.  She denies any major symptoms or abdominal pain or belching or burping. She denies any blood in her stool or lightheadedness or dizziness.   Relevant past medical, surgical, family and social history reviewed and updated as indicated. Interim medical history since our last visit reviewed. Allergies and medications reviewed and updated.  Review of Systems  Constitutional: Negative for chills and fever.  Eyes: Negative for discharge.  Respiratory: Negative for shortness of breath and wheezing.     Cardiovascular: Negative for chest pain and leg swelling.  Musculoskeletal: Negative for back pain and gait problem.  Skin: Negative for rash.  Neurological: Negative for dizziness, weakness and numbness.  All other systems reviewed and are negative.   Per HPI unless specifically indicated above   Allergies as of 10/26/2019      Reactions   Glyxambi [empagliflozin-linagliptin] Diarrhea, Itching   He was able to take Jardiance alone with no side effects so it was the Tradjenta part that most likely gave him the reaction   Hctz [hydrochlorothiazide]    Nausea and headache    Sulfa Antibiotics Nausea And Vomiting   Sulfonamide Derivatives       Medication List       Accurate as of October 26, 2019  9:27 AM. If you have any questions, ask your nurse or doctor.        STOP taking these medications   Testosterone 20.25 MG/ACT (1.62%) Gel Commonly known as: AndroGel Pump Stopped by: [name] [place] Pfund, MD     TAKE these medications   aspirin EC 81 MG tablet Take 81 mg by mouth daily.   atenolol 25 MG tablet Commonly known as: TENORMIN TAKE 1 TABLET DAILY   atorvastatin 40 MG tablet Commonly known as: LIPITOR TAKE 1 TABLET DAILY   B-D UF III MINI PEN NEEDLES 31G X 5 MM Misc Generic drug: Insulin Pen Needle 1 each by Does not apply route daily.   cholecalciferol 1000 units tablet Commonly known as: VITAMIN D Take 2,000 Units by mouth daily.  fexofenadine 180 MG tablet Commonly known as: ALLEGRA Take 180 mg by mouth daily.   fluticasone 50 MCG/ACT nasal spray Commonly known as: FLONASE Place 2 sprays into the nose daily as needed for allergies.   GAS-X PO Take 1 capsule by mouth daily.   glipiZIDE 5 MG tablet Commonly known as: GLUCOTROL TAKE 2 TABLETS TWICE A DAY BEFORE MEALS   lisinopril 20 MG tablet Commonly known as: ZESTRIL TAKE 1 TABLET DAILY   metFORMIN 1000 MG tablet Commonly known as: GLUCOPHAGE TAKE 1 TABLET TWICE DAILY WITH MEALS    metroNIDAZOLE 250 MG tablet Commonly known as: FLAGYL TAKE 1 TABLET BY MOUTH THREE TIMES DAILY   promethazine 25 MG tablet Commonly known as: PHENERGAN TAKE 1 TABLET EVERY 4 HOURS AS NEEDED   Rybelsus 14 MG Tabs Generic drug: Semaglutide Take 14 mg by mouth daily.   temazepam 15 MG capsule Commonly known as: RESTORIL TAKE ONE CAPSULE BY MOUTH AT BEDTIME AS NEEDED   Toujeo SoloStar 300 UNIT/ML Solostar Pen Generic drug: insulin glargine (1 Unit Dial) Inject 10-20 Units into the skin daily.        Objective:   BP 134/72   Pulse 66   Temp (!) 97.5 F (36.4 C) (Temporal)   Ht 5\' 10"  (1.778 m)   Wt 204 lb 8 oz (92.8 kg)   BMI 29.34 kg/m   Wt Readings from Last 3 Encounters:  10/26/19 204 lb 8 oz (92.8 kg)  10/16/19 205 lb 9.6 oz (93.3 kg)  04/20/19 206 lb 6.4 oz (93.6 kg)    Physical Exam Vitals and nursing note reviewed.  Constitutional:      General: He is not in acute distress.    Appearance: He is well-developed. He is not diaphoretic.  Eyes:     General: No scleral icterus.    Conjunctiva/sclera: Conjunctivae normal.  Neck:     Thyroid: No thyromegaly.  Cardiovascular:     Rate and Rhythm: Normal rate and regular rhythm.     Heart sounds: Normal heart sounds. No murmur.  Pulmonary:     Effort: Pulmonary effort is normal. No respiratory distress.     Breath sounds: Normal breath sounds. No wheezing.  Musculoskeletal:        General: Normal range of motion.     Cervical back: Neck supple.  Lymphadenopathy:     Cervical: No cervical adenopathy.  Skin:    General: Skin is warm and dry.     Findings: No rash.  Neurological:     Mental Status: He is alert and oriented to person, place, and time.     Coordination: Coordination normal.  Psychiatric:        Behavior: Behavior normal.     Diabetic Foot Exam - Simple   Simple Foot Form Visual Inspection No deformities, no ulcerations, no other skin breakdown bilaterally: Yes Sensation Testing See  comments: Yes Pulse Check Posterior Tibialis and Dorsalis pulse intact bilaterally: Yes Comments Slight decreased sensation in left great toe and left heel although could feel them on second attempt.      Assessment & Plan:   Problem List Items Addressed This Visit      Cardiovascular and Mediastinum   Essential hypertension     Digestive   GERD (gastroesophageal reflux disease)   NAFLD (nonalcoholic fatty liver disease)   Relevant Orders   CMP14+EGFR     Endocrine   Type 2 diabetes mellitus with hyperlipidemia (HCC) - Primary   Relevant Orders   [facility]ayer [facility]  Hb A1c Waived   CMP14+EGFR     Other   Hyperlipidemia with target LDL less than 70      Patient is coming in today for a diabetes recheck and it looks like his A1c is 9.3 which is improved from 10.4 going in the right direction, instructed patient to keep Rybelsus at the same that he is at an increased Toujeo to 14 units from 12 and watch for hypoglycemia and give me a call if he gets few hypoglycemia episodes near each other.  Blood pressure looks good, will recheck liver function today for fatty liver disease. Follow up plan: Return in about 3 months (around 01/25/2020), or if symptoms worsen or fail to improve, for Diabetes recheck.  Counseling provided for all of the vaccine components Orders Placed This Encounter  Procedures  . Bayer DCA Hb A1c Waived  . CMP14+EGFR    Arville Care, MD [place] [facility] 10/26/2019, 9:27 AM

## 2019-10-27 LAB — CMP14+EGFR
ALT: 40 IU/L (ref 0–44)
AST: 31 IU/L (ref 0–40)
Albumin/Globulin Ratio: 2.2 (ref 1.2–2.2)
Albumin: 4.9 g/dL — ABNORMAL HIGH (ref 3.8–4.8)
Alkaline Phosphatase: 76 IU/L (ref 39–117)
BUN/Creatinine Ratio: 19 (ref 10–24)
BUN: 24 mg/dL (ref 8–27)
Bilirubin Total: 0.5 mg/dL (ref 0.0–1.2)
CO2: 21 mmol/L (ref 20–29)
Calcium: 9.1 mg/dL (ref 8.6–10.2)
Chloride: 108 mmol/L — ABNORMAL HIGH (ref 96–106)
Creatinine, Ser: 1.26 mg/dL (ref 0.76–1.27)
GFR calc Af Amer: 69 mL/min/{1.73_m2} (ref >59)
GFR calc non Af Amer: 60 mL/min/{1.73_m2} (ref >59)
Globulin, Total: 2.2 g/dL (ref 1.5–4.5)
Glucose: 169 mg/dL — ABNORMAL HIGH (ref 65–99)
Potassium: 5 mmol/L (ref 3.5–5.2)
Sodium: 144 mmol/L (ref 134–144)
Total Protein: 7.1 g/dL (ref 6.0–8.5)

## 2019-11-13 ENCOUNTER — Other Ambulatory Visit: Payer: Self-pay | Admitting: Family Medicine

## 2019-11-15 ENCOUNTER — Other Ambulatory Visit (INDEPENDENT_AMBULATORY_CARE_PROVIDER_SITE_OTHER): Payer: Self-pay | Admitting: Gastroenterology

## 2019-11-15 NOTE — Telephone Encounter (Signed)
Patient will need appointment with Verdene Rio prior to further refills for antibiotic use. This is per Dr.Rehman.

## 2019-11-27 ENCOUNTER — Ambulatory Visit: Payer: BC Managed Care – PPO | Admitting: Cardiology

## 2019-12-13 ENCOUNTER — Other Ambulatory Visit: Payer: Self-pay | Admitting: Family Medicine

## 2019-12-16 ENCOUNTER — Other Ambulatory Visit: Payer: Self-pay | Admitting: Family Medicine

## 2019-12-28 ENCOUNTER — Other Ambulatory Visit: Payer: Self-pay | Admitting: *Deleted

## 2020-01-11 DIAGNOSIS — H903 Sensorineural hearing loss, bilateral: Secondary | ICD-10-CM | POA: Diagnosis not present

## 2020-01-11 DIAGNOSIS — I10 Essential (primary) hypertension: Secondary | ICD-10-CM | POA: Diagnosis not present

## 2020-01-12 LAB — BASIC METABOLIC PANEL
BUN/Creatinine Ratio: 16 (calc) (ref 6–22)
BUN: 22 mg/dL (ref 7–25)
CO2: 25 mmol/L (ref 20–32)
Calcium: 9.3 mg/dL (ref 8.6–10.3)
Chloride: 106 mmol/L (ref 98–110)
Creat: 1.37 mg/dL — ABNORMAL HIGH (ref 0.70–1.25)
Glucose, Bld: 330 mg/dL — ABNORMAL HIGH (ref 65–139)
Potassium: 4.9 mmol/L (ref 3.5–5.3)
Sodium: 141 mmol/L (ref 135–146)

## 2020-01-15 ENCOUNTER — Ambulatory Visit (HOSPITAL_COMMUNITY)
Admission: RE | Admit: 2020-01-15 | Discharge: 2020-01-15 | Disposition: A | Discharge status: Home / Self Care | Payer: BC Managed Care – PPO | Source: Ambulatory Visit | Attending: Cardiology | Admitting: Cardiology

## 2020-01-15 ENCOUNTER — Other Ambulatory Visit: Payer: Self-pay

## 2020-01-15 DIAGNOSIS — I2510 Atherosclerotic heart disease of native coronary artery without angina pectoris: Secondary | ICD-10-CM | POA: Diagnosis not present

## 2020-01-15 DIAGNOSIS — I7121 Aneurysm of the ascending aorta, without rupture: Secondary | ICD-10-CM

## 2020-01-15 DIAGNOSIS — I700 Atherosclerosis of aorta: Secondary | ICD-10-CM | POA: Diagnosis not present

## 2020-01-15 DIAGNOSIS — J432 Centrilobular emphysema: Secondary | ICD-10-CM | POA: Diagnosis not present

## 2020-01-15 DIAGNOSIS — I712 Thoracic aortic aneurysm, without rupture: Secondary | ICD-10-CM | POA: Insufficient documentation

## 2020-01-15 MED ORDER — IOHEXOL 350 MG/ML SOLN
100.0000 mL | Freq: Once | INTRAVENOUS | Status: AC | PRN
  Administered 2020-01-15: 100 mL via INTRAVENOUS

## 2020-01-16 ENCOUNTER — Telehealth: Payer: Self-pay | Admitting: *Deleted

## 2020-01-16 NOTE — Telephone Encounter (Signed)
-----   Message from Antoine Poche, MD sent at 01/15/2020 12:27 PM EDT ----- Aorta remains stable, very mildly enlarged. Continue to monitor  Dominga Ferry MD

## 2020-01-16 NOTE — Telephone Encounter (Signed)
Pt voiced understanding - routed to pcp  

## 2020-01-21 ENCOUNTER — Other Ambulatory Visit: Payer: Self-pay | Admitting: Cardiology

## 2020-01-21 ENCOUNTER — Other Ambulatory Visit: Payer: Self-pay | Admitting: Family Medicine

## 2020-01-25 ENCOUNTER — Encounter: Payer: Self-pay | Admitting: Family Medicine

## 2020-01-25 ENCOUNTER — Other Ambulatory Visit: Payer: Self-pay

## 2020-01-25 ENCOUNTER — Ambulatory Visit (INDEPENDENT_AMBULATORY_CARE_PROVIDER_SITE_OTHER): Payer: BC Managed Care – PPO | Admitting: Family Medicine

## 2020-01-25 VITALS — BP 129/69 | HR 67 | Temp 98.2°F | Ht 70.0 in | Wt 202.0 lb

## 2020-01-25 DIAGNOSIS — Z23 Encounter for immunization: Secondary | ICD-10-CM | POA: Diagnosis not present

## 2020-01-25 DIAGNOSIS — I482 Chronic atrial fibrillation, unspecified: Secondary | ICD-10-CM

## 2020-01-25 DIAGNOSIS — E1169 Type 2 diabetes mellitus with other specified complication: Secondary | ICD-10-CM | POA: Diagnosis not present

## 2020-01-25 DIAGNOSIS — K760 Fatty (change of) liver, not elsewhere classified: Secondary | ICD-10-CM

## 2020-01-25 DIAGNOSIS — J439 Emphysema, unspecified: Secondary | ICD-10-CM

## 2020-01-25 DIAGNOSIS — E785 Hyperlipidemia, unspecified: Secondary | ICD-10-CM | POA: Diagnosis not present

## 2020-01-25 DIAGNOSIS — I10 Essential (primary) hypertension: Secondary | ICD-10-CM

## 2020-01-25 LAB — BAYER DCA HB A1C WAIVED: HB A1C (BAYER DCA - WAIVED): 9.2 % — ABNORMAL HIGH (ref <7.0)

## 2020-01-25 MED ORDER — BD PEN NEEDLE MINI U/F 31G X 5 MM MISC: 1.0000 | Freq: Every day | 3 refills | Status: DC

## 2020-01-25 MED ORDER — TOUJEO SOLOSTAR 300 UNIT/ML ~~LOC~~ SOPN: 10.0000 [IU] | PEN_INJECTOR | Freq: Every day | SUBCUTANEOUS | 3 refills | Status: DC

## 2020-01-25 MED ORDER — ATORVASTATIN CALCIUM 40 MG PO TABS: 40.0000 mg | ORAL_TABLET | Freq: Every day | ORAL | 3 refills | Status: DC

## 2020-01-25 NOTE — Patient Instructions (Signed)
About 2-minute

## 2020-01-25 NOTE — Progress Notes (Signed)
BP 129/69   Pulse 67   Temp 98.2 F (36.8 C)   Ht _0  (1.778 m)   Wt 202 lb (91.6 kg)   SpO2 100%   BMI 28.98 kg/m    Subjective:   Patient ID: [name] [name], male    DOB: 1955-10-01, 64 y.o.   MRN: [id number]  HPI: [name] [name] is a 64 y.o. male presenting on 01/25/2020 for Medical Management of Chronic Issues and Diabetes   HPI Type 2 diabetes mellitus Patient comes in today for recheck of his diabetes. Patient has been currently taking Toujeo and glipizide and Metformin. Patient is currently on an ACE inhibitor/ARB. Patient has not seen an ophthalmologist this year. Patient denies any issues with their feet. The symptom started onset as an adult hyperlipidemia and hypertension and A. fib ARE RELATED TO DM   Hyperlipidemia Patient is coming in for recheck of his hyperlipidemia. The patient is currently taking atorvastatin. They deny any issues with myalgias or history of liver damage from it. They deny any focal numbness or weakness or chest pain.   Hypertension Patient is currently on atenolol and Lisinopril, and their blood pressure today is 129/69. Patient denies any lightheadedness or dizziness. Patient denies headaches, blurred vision, chest pains, shortness of breath, or weakness. Denies any side effects from medication and is content with current medication.   A. fib recheck Patient has chronic A. fib but was not deemed a good candidate for long-term anticoagulation and is on aspirin patient denies any chest pain or palpitations or issues with it.  Relevant past medical, surgical, family and social history reviewed and updated as indicated. Interim medical history since our last visit reviewed. Allergies and medications reviewed and updated.  Review of Systems  Constitutional: Negative for chills and fever.  Respiratory: Negative for chest tightness, shortness of breath and wheezing.   Cardiovascular: Negative for chest pain, palpitations and leg swelling.    Musculoskeletal: Negative for back pain and gait problem.  Skin: Negative for rash.  Neurological: Negative for dizziness.  All other systems reviewed and are negative.   Per HPI unless specifically indicated above   Allergies as of 01/25/2020      Reactions   Glyxambi [empagliflozin-linagliptin] Diarrhea, Itching   He was able to take Jardiance alone with no side effects so it was the Tradjenta part that most likely gave him the reaction   Hctz [hydrochlorothiazide]    Nausea and headache    Sulfa Antibiotics Nausea And Vomiting   Sulfonamide Derivatives       Medication List       Accurate as of January 25, 2020 11:59 PM. If you have any questions, ask your nurse or doctor.        aspirin EC 81 MG tablet Take 81 mg by mouth daily.   atenolol 25 MG tablet Commonly known as: TENORMIN TAKE 1 TABLET DAILY   atorvastatin 40 MG tablet Commonly known as: LIPITOR Take 1 tablet (40 mg total) by mouth daily.   B-D UF III MINI PEN NEEDLES 31G X 5 MM Misc Generic drug: Insulin Pen Needle 1 each by Does not apply route daily.   cholecalciferol 1000 units tablet Commonly known as: VITAMIN D Take 2,000 Units by mouth daily.   fexofenadine 180 MG tablet Commonly known as: ALLEGRA Take 180 mg by mouth daily.   fluticasone 50 MCG/ACT nasal spray Commonly known as: FLONASE Place 2 sprays into the nose daily as needed for allergies.   GAS-X  PO Take 1 capsule by mouth daily.   glipiZIDE 5 MG tablet Commonly known as: GLUCOTROL TAKE 2 TABLETS TWICE A DAY BEFORE MEALS   lisinopril 20 MG tablet Commonly known as: ZESTRIL TAKE 1 TABLET DAILY   metFORMIN 1000 MG tablet Commonly known as: GLUCOPHAGE TAKE 1 TABLET TWICE A DAY WITH MEALS   promethazine 25 MG tablet Commonly known as: PHENERGAN TAKE 1 TABLET EVERY 4 HOURS AS NEEDED   Rybelsus 14 MG Tabs Generic drug: Semaglutide TAKE 1 TABLET BY MOUTH EVERY DAY   temazepam 15 MG capsule Commonly known as: RESTORIL TAKE  ONE CAPSULE BY MOUTH AT BEDTIME AS NEEDED   Toujeo SoloStar 300 UNIT/ML Solostar Pen Generic drug: insulin glargine (1 Unit Dial) Inject 10-20 Units into the skin daily.        Objective:   BP 129/69   Pulse 67   Temp 98.2 F (36.8 C)   Ht _0  (1.778 m)   Wt 202 lb (91.6 kg)   SpO2 100%   BMI 28.98 kg/m   Wt Readings from Last 3 Encounters:  01/25/20 202 lb (91.6 kg)  10/26/19 204 lb 8 oz (92.8 kg)  10/16/19 205 lb 9.6 oz (93.3 kg)    Physical Exam Vitals and nursing note reviewed.  Constitutional:      General: He is not in acute distress.    Appearance: He is well-developed. He is not diaphoretic.  Eyes:     General: No scleral icterus.    Conjunctiva/sclera: Conjunctivae normal.  Neck:     Thyroid: No thyromegaly.  Cardiovascular:     Rate and Rhythm: Normal rate. Rhythm irregular.  Pulmonary:     Effort: Pulmonary effort is normal. No respiratory distress.     Breath sounds: Normal breath sounds. No wheezing.  Musculoskeletal:        General: Normal range of motion.     Cervical back: Neck supple.  Lymphadenopathy:     Cervical: No cervical adenopathy.  Skin:    General: Skin is warm and dry.     Findings: No rash.  Neurological:     Mental Status: He is alert and oriented to person, place, and time.     Coordination: Coordination normal.  Psychiatric:        Behavior: Behavior normal.       Assessment & Plan:   Problem List Items Addressed This Visit      Cardiovascular and Mediastinum   Essential hypertension   Relevant Medications   atorvastatin (LIPITOR) 40 MG tablet   Other Relevant Orders   CMP14+EGFR (Completed)   Atrial fibrillation, chronic (HCC)   Relevant Medications   atorvastatin (LIPITOR) 40 MG tablet   Other Relevant Orders   CBC with Differential/Platelet (Completed)     Endocrine   Type 2 diabetes mellitus with hyperlipidemia (HCC) - Primary   Relevant Medications   atorvastatin (LIPITOR) 40 MG tablet   insulin  glargine, 1 Unit Dial, (TOUJEO SOLOSTAR) 300 UNIT/ML Solostar Pen   Insulin Pen Needle (B-D UF III MINI PEN NEEDLES) 31G X 5 MM MISC   Other Relevant Orders   Bayer DCA Hb A1c Waived (Completed)   CBC with Differential/Platelet (Completed)   Lipid panel (Completed)   CMP14+EGFR (Completed)     Other   Hyperlipidemia with target LDL less than 70   Relevant Medications   atorvastatin (LIPITOR) 40 MG tablet   Other Relevant Orders   Lipid panel (Completed)    Other Visit Diagnoses  Pulmonary emphysema, unspecified emphysema type ([place])       Need for Tdap vaccination       Relevant Orders   Tdap vaccine greater than or equal to 7yo IM (Completed)   Fatty liver disease, nonalcoholic       Relevant Orders   Hepatitis panel, acute (Completed)   CMP14+EGFR (Completed)      Increase insulin to 18 units daily Continue other medication currently, will discuss with the pharmacist some other possible changes when he goes to see her. Follow up plan: Return in about 3 months (around 04/26/2020), or if symptoms worsen or fail to improve, for Patient is appoint with Almy[facility], follow-up with 3 months for diabetes with me, ASAP with Almy[facility].  Counseling provided for all of the vaccine components Orders Placed This Encounter  Procedures  . Tdap vaccine greater than or equal to 7yo IM  . Bayer DCA Hb A1c Waived  . CBC with Differential/Platelet  . Lipid panel  . Hepatitis panel, acute  . CMP14+EGFR    Caryl Pina, MD [facility] 02/04/2020, 10:51 PM

## 2020-01-26 ENCOUNTER — Other Ambulatory Visit: Payer: Self-pay

## 2020-01-26 ENCOUNTER — Ambulatory Visit (INDEPENDENT_AMBULATORY_CARE_PROVIDER_SITE_OTHER): Payer: BC Managed Care – PPO | Admitting: Pharmacist

## 2020-01-26 DIAGNOSIS — E1169 Type 2 diabetes mellitus with other specified complication: Secondary | ICD-10-CM | POA: Diagnosis not present

## 2020-01-26 DIAGNOSIS — E785 Hyperlipidemia, unspecified: Secondary | ICD-10-CM | POA: Diagnosis not present

## 2020-01-26 LAB — CMP14+EGFR
ALT: 46 IU/L — ABNORMAL HIGH (ref 0–44)
AST: 28 IU/L (ref 0–40)
Albumin/Globulin Ratio: 2 (ref 1.2–2.2)
Albumin: 4.7 g/dL (ref 3.8–4.8)
Alkaline Phosphatase: 72 IU/L (ref 48–121)
BUN/Creatinine Ratio: 20 (ref 10–24)
BUN: 29 mg/dL — ABNORMAL HIGH (ref 8–27)
Bilirubin Total: 0.4 mg/dL (ref 0.0–1.2)
CO2: 21 mmol/L (ref 20–29)
Calcium: 9.3 mg/dL (ref 8.6–10.2)
Chloride: 108 mmol/L — ABNORMAL HIGH (ref 96–106)
Creatinine, Ser: 1.46 mg/dL — ABNORMAL HIGH (ref 0.76–1.27)
GFR calc Af Amer: 58 mL/min/{1.73_m2} — ABNORMAL LOW (ref >59)
GFR calc non Af Amer: 50 mL/min/{1.73_m2} — ABNORMAL LOW (ref >59)
Globulin, Total: 2.4 g/dL (ref 1.5–4.5)
Glucose: 133 mg/dL — ABNORMAL HIGH (ref 65–99)
Potassium: 4.7 mmol/L (ref 3.5–5.2)
Sodium: 143 mmol/L (ref 134–144)
Total Protein: 7.1 g/dL (ref 6.0–8.5)

## 2020-01-26 LAB — CBC WITH DIFFERENTIAL/PLATELET
Basophils Absolute: 0 10*3/uL (ref 0.0–0.2)
Basos: 1 %
EOS (ABSOLUTE): 0.1 10*3/uL (ref 0.0–0.4)
Eos: 2 %
Hematocrit: 36.8 % — ABNORMAL LOW (ref 37.5–51.0)
Hemoglobin: 12.5 g/dL — ABNORMAL LOW (ref 13.0–17.7)
Immature Grans (Abs): 0 10*3/uL (ref 0.0–0.1)
Immature Granulocytes: 0 %
Lymphocytes Absolute: 2 10*3/uL (ref 0.7–3.1)
Lymphs: 29 %
MCH: 32.5 pg (ref 26.6–33.0)
MCHC: 34 g/dL (ref 31.5–35.7)
MCV: 96 fL (ref 79–97)
Monocytes Absolute: 0.6 10*3/uL (ref 0.1–0.9)
Monocytes: 8 %
Neutrophils Absolute: 4.1 10*3/uL (ref 1.4–7.0)
Neutrophils: 60 %
Platelets: 201 10*3/uL (ref 150–450)
RBC: 3.85 x10E6/uL — ABNORMAL LOW (ref 4.14–5.80)
RDW: 12.4 % (ref 11.6–15.4)
WBC: 6.8 10*3/uL (ref 3.4–10.8)

## 2020-01-26 LAB — HEPATITIS PANEL, ACUTE
Hep A IgM: NEGATIVE
Hep B C IgM: NEGATIVE
Hep C Virus Ab: <0.1 s/co ratio (ref 0.0–0.9)
Hepatitis B Surface Ag: NEGATIVE

## 2020-01-26 LAB — LIPID PANEL
Chol/HDL Ratio: 3.7 ratio (ref 0.0–5.0)
Cholesterol, Total: 104 mg/dL (ref 100–199)
HDL: 28 mg/dL — ABNORMAL LOW (ref >39)
LDL Chol Calc (NIH): 49 mg/dL (ref 0–99)
Triglycerides: 156 mg/dL — ABNORMAL HIGH (ref 0–149)
VLDL Cholesterol Cal: 27 mg/dL (ref 5–40)

## 2020-01-26 NOTE — Progress Notes (Signed)
    01/26/2020 Name: [name] [name] MRN: [id number] DOB: Mar 28, 1956   S:  84 yoM presents for diabetes evaluation, education, and management  Patient was referred and last seen by Primary Care Provider on 01/25/20.  Insurance coverage/medication affordability: BCBS commercial  Patient reports adherence with medications. . Current diabetes medications include: toujeo, rybelsus, metformin, glipizide . Current hypertension medications include: atenolol, lisinopril Goal 130/80 . Current hyperlipidemia medications include: atorvastatin   Patient denies hypoglycemic events.   Patient reported dietary habits: Eats 2-3 meals/day -Patient enjoys sweet tea -He is enjoying summer fruits in season (peaches) -He enjoys salads at night and reports having a sensible lunch  Discussed meal planning options and Plate method for healthy eating . Avoid sugary drinks and desserts . Incorporate balanced protein, non starchy veggies, 1 serving of carbohydrate with each meal . Increase water intake . Increase physical activity as able  Patient-reported exercise habits: riding a bike, recently hurt hip moving furniture    O:  Lab Results  Component Value Date   HGBA1C 9.2 (H) 01/25/2020   Lipid Panel     Component Value Date/Time   CHOL 104 01/25/2020 0800   CHOL 76 12/05/2012 0931   TRIG 156 (H) 01/25/2020 0800   TRIG 180 (H) 01/19/2017 1131   TRIG 43 12/05/2012 0931   HDL 28 (L) 01/25/2020 0800   HDL 28 (L) 01/19/2017 1131   HDL 36 (L) 12/05/2012 0931   CHOLHDL 3.7 01/25/2020 0800   LDLCALC 49 01/25/2020 0800   LDLCALC 50 02/28/2014 1116   LDLCALC 31 12/05/2012 0931    Home fasting blood sugars: FBG 132 this AM FBG avg 150 BG 180 in the PM  2 hour post-meal/random blood sugars: n/a.    A/P:  Diabetes T2DM currently uncontrolled. Patient is able to verbalize appropriate hypoglycemia management plan. Patient is adherent with medication. Control is suboptimal due to  diet  -CONTINUE insulin (Toujeo) 18 units daily  $0 copay card given for patient to use at the pharmacy  -Continue metformin  -Continue Rybelsus 14mg  daily  Patient reports he has terrrible gas where he does not feel like taking rybelsus.  He takes phenergan/gas-X when this occurs  Counseled on GLP1 PO, patient to take in the AM with water, 30 min before he eats/drinks/takes medications  Encouraged patient to reach out--may decrease dose to 7mg  daily if needed due to intolerance  $10 copay card given for patient to use at the pharmacy  -Extensively discussed pathophysiology of diabetes, recommended lifestyle interventions, dietary effects on blood sugar control  patient to decrease sweet tea intake, increase physical activity as able  -Counseled on s/sx of and management of hypoglycemia  -Next A1C anticipated 04/26/20.   Written patient instructions provided.  Total time in face to face counseling 30 minutes.   Follow up Pharmacist/PCP Clinic Visit ON 04/26/20.    04/28/20, PharmD, BCPS Clinical Pharmacist, [facility]  II Phone [phone]

## 2020-02-08 DIAGNOSIS — H9313 Tinnitus, bilateral: Secondary | ICD-10-CM | POA: Insufficient documentation

## 2020-02-08 DIAGNOSIS — H903 Sensorineural hearing loss, bilateral: Secondary | ICD-10-CM | POA: Diagnosis not present

## 2020-02-11 ENCOUNTER — Other Ambulatory Visit: Payer: Self-pay | Admitting: Family Medicine

## 2020-02-20 ENCOUNTER — Other Ambulatory Visit: Payer: Self-pay | Admitting: Family Medicine

## 2020-03-05 ENCOUNTER — Ambulatory Visit (INDEPENDENT_AMBULATORY_CARE_PROVIDER_SITE_OTHER): Payer: BC Managed Care – PPO | Admitting: Internal Medicine

## 2020-03-11 ENCOUNTER — Other Ambulatory Visit: Payer: Self-pay | Admitting: Family Medicine

## 2020-03-12 ENCOUNTER — Ambulatory Visit (INDEPENDENT_AMBULATORY_CARE_PROVIDER_SITE_OTHER): Payer: BC Managed Care – PPO | Admitting: Nurse Practitioner

## 2020-03-17 ENCOUNTER — Other Ambulatory Visit: Payer: Self-pay | Admitting: Family Medicine

## 2020-03-20 ENCOUNTER — Encounter (INDEPENDENT_AMBULATORY_CARE_PROVIDER_SITE_OTHER): Payer: Self-pay | Admitting: *Deleted

## 2020-03-20 ENCOUNTER — Ambulatory Visit (INDEPENDENT_AMBULATORY_CARE_PROVIDER_SITE_OTHER): Payer: BC Managed Care – PPO | Admitting: Gastroenterology

## 2020-03-20 ENCOUNTER — Other Ambulatory Visit: Payer: Self-pay

## 2020-03-20 ENCOUNTER — Encounter (INDEPENDENT_AMBULATORY_CARE_PROVIDER_SITE_OTHER): Payer: Self-pay | Admitting: Gastroenterology

## 2020-03-20 VITALS — BP 123/74 | HR 72 | Temp 98.0°F | Ht 70.0 in | Wt 200.3 lb

## 2020-03-20 DIAGNOSIS — R932 Abnormal findings on diagnostic imaging of liver and biliary tract: Secondary | ICD-10-CM | POA: Diagnosis not present

## 2020-03-20 MED ORDER — METRONIDAZOLE 250 MG PO TABS: 250.0000 mg | ORAL_TABLET | Freq: Every day | ORAL | 1 refills | Status: DC | PRN

## 2020-03-20 NOTE — Progress Notes (Signed)
Patient profile: [name] [name] is a 64 y.o. male seen for follow-up of irritable bowel.  He was last seen September 2020.  At that time noted intermittent use of Flagyl 250 mg twice a day for 7 days for abdominal gas and bloating.  History of Present Illness: [name] [name] is seen today for follow-up.  He reports overall symptoms at baseline over the past year.  About once a week or every other week he will develop gas, bloating, bad taste in his mouth and nausea.  He chronically takes 1 dose of metronidazole 250 mg and the symptoms resolve.  He feels it worsens where he is vomiting if he is not able to take the metronidazole.  He reports taking PRN for about 2 years.  He is try probiotic in the past without symptom improvement.  He has only found food triggers of broccoli and cabbage and generally avoids these.  He takes a stool softener daily and typically has a bowel movement every day.  He denies any blood in stool or black stool.  He denies any lower abdominal pain with bowels.   Occasional GERD symptoms that resolved with Tums.  No dysphagia.   We discussed his chest CT showing cirrhosis-he has been told for many years he has a fatty liver.  He denies any prior alcohol use.  No family history of liver disease.  No known high risk behaviors.  Wt Readings from Last 3 Encounters:  03/20/20 200 lb 4.8 oz (90.9 kg)  01/25/20 202 lb (91.6 kg)  10/26/19 204 lb 8 oz (92.8 kg)     Last Colonoscopy: due to having rectal bleeding and diarrhea 09/22/2018 with Dr. Karilyn Cota: showed diverticulosis in the sigmoid colon and at the hepatic flexure and external and internal hemorrhoids. 10 yr recall  ERCP 2018- Filling defects consistent with stones was seen on the cholangiogram. - Choledocholithiasis was found. Complete removal was accomplished by biliary sphincterotomy and basket extraction and dormia basket. - A biliary sphincterotomy was performed. - The biliary tree was swept with dormia basket and  balloon stone extractor. - Clip application at roof of papilla attempted but not possble.   Past Medical History:  Past Medical History:  Diagnosis Date  . Aortic stenosis 8/09   Bicuspid aortic valve  . Arthritis 6/10   hand   . Atrial fibrillation (HCC)    post op only  . Bile duct calculus with acute cholecystitis   . CAD (coronary artery disease) 8/09  . Calculus of common bile duct with acute pancreatitis 11/2016  . CTS (carpal tunnel syndrome)    bilateral   . Diabetes (HCC)   . Diastolic dysfunction 8/09  . Elevated LFTs   . Fatty liver disease, nonalcoholic   . History of kidney stones   . Hyperlipidemia   . Hypertension   . Kidney stones   . Nasal congestion   . NIDDM (non-insulin dependent diabetes mellitus)    x15 yrs  . OSA on CPAP 2006  . Paresthesias in left hand 02/19/2004  . Paresthesias in right hand 02/19/2004  . Sinusitis   . Vitamin D deficiency 06/13/09    Problem List: Patient Active Problem List   Diagnosis Date Noted  . IBS (irritable bowel syndrome) 03/09/2019  . Common bile duct stone 11/24/2016  . Pulmonary nodules 11/18/2016  . Type 2 diabetes mellitus with hyperlipidemia (HCC) 12/04/2015  . GERD (gastroesophageal reflux disease) 03/08/2014  . NAFLD (nonalcoholic fatty liver disease) 60/73/7106  . Ascending aortic  aneurysm (HCC) 12/12/2012  . INSOMNIA 09/15/2010  . Atrial fibrillation, chronic (HCC) 09/15/2010  . CAD (coronary artery disease) 07/09/2009  . AORTIC VALVE DISORDERS 05/27/2009  . Hyperlipidemia with target LDL less than 70 05/24/2009  . Essential hypertension 05/24/2009  . UNSPECIFIED SLEEP APNEA 05/24/2009    Past Surgical History: Past Surgical History:  Procedure Laterality Date  . bladder cancer - operation - 01/21/18 - Dr Berneice Heinrich  01/21/2018  . CARPAL TUNNEL RELEASE     bilateral   . CHOLECYSTECTOMY  11/2016  . COLONOSCOPY N/A 09/22/2018   Procedure: COLONOSCOPY;  Surgeon: Malissa Hippo, MD;  Location: [facility];  Service: Endoscopy;  Laterality: N/A;  1:00  . CORONARY ANGIOPLASTY WITH STENT PLACEMENT     to rt coronary atery (Dr. Degert-cardiologist)  . CYSTOSCOPY WITH RETROGRADE PYELOGRAM, URETEROSCOPY AND STENT PLACEMENT Left 03/07/2018   Procedure: CYSTOSCOPY WITH RETROGRADE PYELOGRAM,  AND STENT PLACEMENT;  Surgeon: Sebastian Ache, MD;  Location: [facility];  Service: Urology;  Laterality: Left;  . ERCP N/A 11/24/2016   Procedure: ENDOSCOPIC RETROGRADE CHOLANGIOPANCREATOGRAPHY (ERCP);  Surgeon: Malissa Hippo, MD;  Location: [facility];  Service: Endoscopy;  Laterality: N/A;  . EXTRACORPOREAL SHOCK WAVE LITHOTRIPSY Left 03/17/2018   Procedure: LEFT EXTRACORPOREAL SHOCK WAVE LITHOTRIPSY (ESWL);  Surgeon: Bjorn Pippin, MD;  Location: [facility];  Service: Urology;  Laterality: Left;  . REMOVAL OF STONES N/A 11/24/2016   Procedure: REMOVAL OF STONES;  Surgeon: Malissa Hippo, MD;  Location: [facility];  Service: Endoscopy;  Laterality: N/A;  . SPHINCTEROTOMY N/A 11/24/2016   Procedure: SPHINCTEROTOMY;  Surgeon: Malissa Hippo, MD;  Location: [facility];  Service: Endoscopy;  Laterality: N/A;  . TISSUE AORTIC VALVE REPLACEMENT     2012    Allergies: Allergies  Allergen Reactions  . Glyxambi [Empagliflozin-Linagliptin] Diarrhea and Itching    He was able to take Jardiance alone with no side effects so it was the Tradjenta part that most likely gave him the reaction  . Hctz [Hydrochlorothiazide]     Nausea and headache   . Sulfa Antibiotics Nausea And Vomiting  . Sulfonamide Derivatives       Home Medications:  Current Outpatient Medications:  .  aspirin EC 81 MG tablet, Take 81 mg by mouth daily., Disp: , Rfl:  .  atenolol (TENORMIN) 25 MG tablet, TAKE 1 TABLET DAILY, Disp: 90 tablet, Rfl: 3 .  atorvastatin (LIPITOR) 40 MG tablet, TAKE 1 TABLET DAILY, Disp: 90 tablet, Rfl: 0 .  cholecalciferol (VITAMIN D) 1000 UNITS tablet, Take 2,000 Units by mouth daily. , Disp: , Rfl:  .   glipiZIDE (GLUCOTROL) 5 MG tablet, TAKE 2 TABLETS TWICE A DAY BEFORE MEALS, Disp: 360 tablet, Rfl: 0 .  insulin glargine, 1 Unit Dial, (TOUJEO SOLOSTAR) 300 UNIT/ML Solostar Pen, Inject 10-20 Units into the skin daily., Disp: 5 pen, Rfl: 3 .  Insulin Pen Needle (B-D UF III MINI PEN NEEDLES) 31G X 5 MM MISC, 1 each by Does not apply route daily., Disp: 90 each, Rfl: 3 .  lisinopril (ZESTRIL) 20 MG tablet, TAKE 1 TABLET DAILY, Disp: 90 tablet, Rfl: 0 .  metFORMIN (GLUCOPHAGE) 1000 MG tablet, TAKE 1 TABLET TWICE A DAY WITH MEALS, Disp: 180 tablet, Rfl: 0 .  promethazine (PHENERGAN) 25 MG tablet, TAKE 1 TABLET EVERY 4 HOURS AS NEEDED, Disp: 30 tablet, Rfl: 72 .  RYBELSUS 14 MG TABS, TAKE 1 TABLET BY MOUTH EVERY DAY, Disp: 30 tablet, Rfl: 1 .  Simethicone (  GAS-X PO), Take 1 capsule by mouth daily., Disp: , Rfl:  .  temazepam (RESTORIL) 15 MG capsule, TAKE ONE CAPSULE BY MOUTH AT BEDTIME AS NEEDED, Disp: 30 capsule, Rfl: 1 .  fexofenadine (ALLEGRA) 180 MG tablet, Take 180 mg by mouth daily. (Patient not taking: Reported on 03/20/2020), Disp: , Rfl:  .  fluticasone (FLONASE) 50 MCG/ACT nasal spray, Place 2 sprays into the nose daily as needed for allergies.  (Patient not taking: Reported on 03/20/2020), Disp: , Rfl:  .  metroNIDAZOLE (FLAGYL) 250 MG tablet, Take 1 tablet (250 mg total) by mouth daily as needed (abdominal pain)., Disp: 30 tablet, Rfl: 1   Family History: family history includes Alzheimer's disease in his mother; Diabetes in his father and another family member; Hearing loss in his father and mother.    Social History:   reports that he quit smoking about 17 years ago. His smoking use included cigarettes. He started smoking about 45 years ago. He has a 37.50 pack-year smoking history. He has never used smokeless tobacco. He reports that he does not drink alcohol and does not use drugs.   Review of Systems: Constitutional: Denies weight loss/weight gain  Eyes: No changes in vision. ENT: No  oral lesions, sore throat.  GI: see HPI.  Heme/Lymph: No easy bruising.  CV: No chest pain.  GU: No hematuria.  Integumentary: No rashes.  Neuro: No headaches.  Psych: No depression/anxiety.  Endocrine: No heat/cold intolerance.  Allergic/Immunologic: No urticaria.  Resp: No cough, SOB.  Musculoskeletal: No joint swelling.    Physical Examination: BP 123/74 (BP Location: Right Arm, Patient Position: Sitting, Cuff Size: Normal)   Pulse 72   Temp 98 F (36.7 C) (Oral)   Ht 5\' 10"  (1.778 m)   Wt 200 lb 4.8 oz (90.9 kg)   BMI 28.74 kg/m  Gen: NAD, alert and oriented x 4 HEENT: PEERLA, EOMI, Neck: supple, no JVD Chest: CTA bilaterally, no wheezes, crackles, or other adventitious sounds CV: RRR, no m/g/c/r Abd: soft, NT, ND, +BS in all four quadrants; no HSM, guarding, ridigity, or rebound tenderness Ext: no edema, well perfused with 2+ pulses, Skin: no rash or lesions noted on observed skin Lymph: no noted LAD  Data Reviewed:   01/2020 CT CHEST ANGIO IMPRESSION: 1. Ascending aorta is at the upper limits of normal in size, stable. 2. Hepatic steatosis. Mild marginal irregularity the liver is indicative of cirrhosis. 3. Aortic atherosclerosis (ICD10-I70.0). Coronary artery calcification. 4.  Emphysema (ICD10-J43.9).   July 2021 labs-BUN 29, creatinine 1.4, ALT 26, otherwise LFTs normal.  Hepatitis panel negative.  CBC with hemoglobin 12.5, MCV 96.  Normal platelets.  A1c 9.2     03/2018-IMPRESSION: CT renal 1. There is a 1.2 cm obstructing stone within the proximal left ureter. 2. Multiple additional bilateral renal stones. 3. Hepatic steatosis. 4. Findings suggestive of AVN left femoral head.   Assessment/Plan: Mr. Christell ConstantMoore is a 64 y.o. male seen for follow up.   Fayrene FearingJames was seen today for follow-up.  Diagnoses and all orders for this visit:  Abnormal CT scan, liver -     INR/PT -     US ABDOMEN RUQ W/ELASTOGRAPHY; Future -     COMPLETE METABOLIC PANEL WITH  GFR  Other orders -     metroNIDAZOLE (FLAGYL) 250 MG tablet; Take 1 tablet (250 mg total) by mouth daily as needed (abdominal pain). -     COMPLETE METABOLIC PANEL WITH GFR     1. Abnormal CT scan -  Cirrhosis incidentally noted on CT chest imaging, his LFTS are normal and platelet/albumin normal. He does have risk factors for fatty liver (DM, etc). Will check INR and further evaluate w/ [place] with elastography. If concerning for cirrhosis will need routine HCC screening, further hepatology labs, etc. Had negative labs for Hep A/B/C.   2. Abdominal pain/nausea/bloating/gas - resolves w/ one dose of metronidazole. He reports chronically using this regimen years and will refill. He will contact me w/ any changes in symptoms. He knows to avoid alcohol while on flagyl. UTD on colonoscopy last 2020.   Further recs pending review of [place] [facility].    I personally performed the service, non-incident to. (WP)  Tawni Pummel, [facility]

## 2020-03-20 NOTE — Patient Instructions (Signed)
We are checking labs and scheduling an ultrasound for evaluation.  I have refilled medication to pharmacy.  Please contact me with any questions.

## 2020-03-21 LAB — PROTIME-INR
INR: 1
Prothrombin Time: 10.9 s (ref 9.0–11.5)

## 2020-03-21 LAB — COMPLETE METABOLIC PANEL WITH GFR
AG Ratio: 1.9 (calc) (ref 1.0–2.5)
ALT: 39 U/L (ref 9–46)
AST: 24 U/L (ref 10–35)
Albumin: 4.8 g/dL (ref 3.6–5.1)
Alkaline phosphatase (APISO): 61 U/L (ref 35–144)
BUN/Creatinine Ratio: 21 (calc) (ref 6–22)
BUN: 32 mg/dL — ABNORMAL HIGH (ref 7–25)
CO2: 22 mmol/L (ref 20–32)
Calcium: 9.7 mg/dL (ref 8.6–10.3)
Chloride: 108 mmol/L (ref 98–110)
Creat: 1.52 mg/dL — ABNORMAL HIGH (ref 0.70–1.25)
GFR, Est African American: 55 mL/min/{1.73_m2} — ABNORMAL LOW (ref >=60)
GFR, Est Non African American: 48 mL/min/{1.73_m2} — ABNORMAL LOW (ref >=60)
Globulin: 2.5 g/dL (calc) (ref 1.9–3.7)
Glucose, Bld: 230 mg/dL — ABNORMAL HIGH (ref 65–139)
Potassium: 5.5 mmol/L — ABNORMAL HIGH (ref 3.5–5.3)
Sodium: 141 mmol/L (ref 135–146)
Total Bilirubin: 0.5 mg/dL (ref 0.2–1.2)
Total Protein: 7.3 g/dL (ref 6.1–8.1)

## 2020-03-25 ENCOUNTER — Ambulatory Visit (HOSPITAL_COMMUNITY)
Admission: RE | Admit: 2020-03-25 | Discharge: 2020-03-25 | Disposition: A | Discharge status: Home / Self Care | Payer: BC Managed Care – PPO | Source: Ambulatory Visit | Attending: Gastroenterology | Admitting: Gastroenterology

## 2020-03-25 ENCOUNTER — Other Ambulatory Visit: Payer: Self-pay

## 2020-03-25 DIAGNOSIS — R932 Abnormal findings on diagnostic imaging of liver and biliary tract: Secondary | ICD-10-CM | POA: Diagnosis not present

## 2020-03-25 DIAGNOSIS — K760 Fatty (change of) liver, not elsewhere classified: Secondary | ICD-10-CM | POA: Diagnosis not present

## 2020-04-03 ENCOUNTER — Other Ambulatory Visit: Payer: Self-pay | Admitting: Family Medicine

## 2020-04-15 ENCOUNTER — Other Ambulatory Visit: Payer: Self-pay | Admitting: Family Medicine

## 2020-04-26 ENCOUNTER — Ambulatory Visit (INDEPENDENT_AMBULATORY_CARE_PROVIDER_SITE_OTHER): Payer: BC Managed Care – PPO | Admitting: Family Medicine

## 2020-04-26 ENCOUNTER — Other Ambulatory Visit: Payer: Self-pay

## 2020-04-26 ENCOUNTER — Encounter: Payer: Self-pay | Admitting: Family Medicine

## 2020-04-26 VITALS — BP 146/74 | HR 67 | Temp 97.2°F | Ht 70.0 in | Wt 203.0 lb

## 2020-04-26 DIAGNOSIS — E785 Hyperlipidemia, unspecified: Secondary | ICD-10-CM | POA: Diagnosis not present

## 2020-04-26 DIAGNOSIS — I10 Essential (primary) hypertension: Secondary | ICD-10-CM | POA: Diagnosis not present

## 2020-04-26 DIAGNOSIS — K219 Gastro-esophageal reflux disease without esophagitis: Secondary | ICD-10-CM

## 2020-04-26 DIAGNOSIS — Z23 Encounter for immunization: Secondary | ICD-10-CM

## 2020-04-26 DIAGNOSIS — E1169 Type 2 diabetes mellitus with other specified complication: Secondary | ICD-10-CM | POA: Diagnosis not present

## 2020-04-26 LAB — BAYER DCA HB A1C WAIVED: HB A1C (BAYER DCA - WAIVED): 7.7 % — ABNORMAL HIGH (ref <7.0)

## 2020-04-26 NOTE — Progress Notes (Signed)
BP (!) 146/74   Pulse 67   Temp (!) 97.2 F (36.2 C)   Ht $R'5\' 10"'cs$  (1.778 m)   Wt 203 lb (92.1 kg)   SpO2 97%   BMI 29.13 kg/m    Subjective:   Patient ID: [name] [name], male    DOB: [birth year], 64 y.o.   MRN: [id number]  HPI: [name] [name] is a 64 y.o. male presenting on 04/26/2020 for Medical Management of Chronic Issues and Diabetes   HPI Type 2 diabetes mellitus Patient comes in today for recheck of his diabetes. Patient has been currently taking Toujeo 18 and Metformin and glipizide and Rybelsus.  He gets occasional abdominal issues with Rybelsus but not an that is causing too many issues.  His A1c is 7.7 which is greatly improved.. Patient is currently on an ACE inhibitor/ARB. Patient has seen an ophthalmologist this year. Patient denies any issues with their feet. The symptom started onset as an adult hypertension hyperlipidemia and CAD ARE RELATED TO DM   Hyperlipidemia Patient is coming in for recheck of his hyperlipidemia. The patient is currently taking atorvastatin. They deny any issues with myalgias or history of liver damage from it. They deny any focal numbness or weakness or chest pain.   Hypertension Patient is currently on atenolol and lisinopril, and their blood pressure today is 146/74 but says it runs in the 130s at home all the time.. Patient denies any lightheadedness or dizziness. Patient denies headaches, blurred vision, chest pains, shortness of breath, or weakness. Denies any side effects from medication and is content with current medication.  Patient takes temazepam occasionally to help with sleep, he got 30 tablets filled in January 2021 and still has a refill.  Relevant past medical, surgical, family and social history reviewed and updated as indicated. Interim medical history since our last visit reviewed. Allergies and medications reviewed and updated.  Review of Systems  Constitutional: Negative for chills and fever.  Eyes: Negative for visual  disturbance.  Respiratory: Negative for shortness of breath and wheezing.   Cardiovascular: Negative for chest pain and leg swelling.  Musculoskeletal: Negative for back pain and gait problem.  Skin: Negative for rash.  Neurological: Negative for dizziness, weakness and light-headedness.  All other systems reviewed and are negative.   Per HPI unless specifically indicated above   Allergies as of 04/26/2020      Reactions   Glyxambi [empagliflozin-linagliptin] Diarrhea, Itching   He was able to take Jardiance alone with no side effects so it was the Tradjenta part that most likely gave him the reaction   Hctz [hydrochlorothiazide]    Nausea and headache    Sulfa Antibiotics Nausea And Vomiting   Sulfonamide Derivatives       Medication List       Accurate as of April 26, 2020  9:54 AM. If you have any questions, ask your nurse or doctor.        aspirin EC 81 MG tablet Take 81 mg by mouth daily.   atenolol 25 MG tablet Commonly known as: TENORMIN TAKE 1 TABLET DAILY   atorvastatin 40 MG tablet Commonly known as: LIPITOR TAKE 1 TABLET DAILY   B-D UF III MINI PEN NEEDLES 31G X 5 MM Misc Generic drug: Insulin Pen Needle 1 each by Does not apply route daily.   cholecalciferol 1000 units tablet Commonly known as: VITAMIN D Take 2,000 Units by mouth daily.   fexofenadine 180 MG tablet Commonly known as: ALLEGRA Take 180 mg  by mouth daily.   fluticasone 50 MCG/ACT nasal spray Commonly known as: FLONASE Place 2 sprays into the nose daily as needed for allergies.   GAS-X PO Take 1 capsule by mouth daily.   glipiZIDE 5 MG tablet Commonly known as: GLUCOTROL TAKE 2 TABLETS TWICE A DAY BEFORE MEALS   lisinopril 20 MG tablet Commonly known as: ZESTRIL TAKE 1 TABLET DAILY   metFORMIN 1000 MG tablet Commonly known as: GLUCOPHAGE TAKE 1 TABLET TWICE A DAY WITH MEALS   metroNIDAZOLE 250 MG tablet Commonly known as: FLAGYL Take 1 tablet (250 mg total) by mouth  daily as needed (abdominal pain).   promethazine 25 MG tablet Commonly known as: PHENERGAN TAKE 1 TABLET EVERY 4 HOURS AS NEEDED   Rybelsus 14 MG Tabs Generic drug: Semaglutide TAKE 1 TABLET BY MOUTH EVERY DAY   temazepam 15 MG capsule Commonly known as: RESTORIL TAKE ONE CAPSULE BY MOUTH AT BEDTIME AS NEEDED   Toujeo SoloStar 300 UNIT/ML Solostar Pen Generic drug: insulin glargine (1 Unit Dial) Inject 10-20 Units into the skin daily.        Objective:   BP (!) 146/74   Pulse 67   Temp (!) 97.2 F (36.2 C)   Ht $R'5\' 10"'uQ$  (1.778 m)   Wt 203 lb (92.1 kg)   SpO2 97%   BMI 29.13 kg/m   Wt Readings from Last 3 Encounters:  04/26/20 203 lb (92.1 kg)  03/20/20 200 lb 4.8 oz (90.9 kg)  01/25/20 202 lb (91.6 kg)    Physical Exam Vitals and nursing note reviewed.  Constitutional:      General: He is not in acute distress.    Appearance: He is well-developed. He is not diaphoretic.  Eyes:     General: No scleral icterus.    Conjunctiva/sclera: Conjunctivae normal.  Neck:     Thyroid: No thyromegaly.  Cardiovascular:     Rate and Rhythm: Normal rate and regular rhythm.     Heart sounds: Normal heart sounds. No murmur heard.   Pulmonary:     Effort: Pulmonary effort is normal. No respiratory distress.     Breath sounds: Normal breath sounds. No wheezing.  Musculoskeletal:        General: Normal range of motion.     Cervical back: Neck supple.  Lymphadenopathy:     Cervical: No cervical adenopathy.  Skin:    General: Skin is warm and dry.     Findings: No rash.  Neurological:     Mental Status: He is alert and oriented to person, place, and time.     Coordination: Coordination normal.  Psychiatric:        Behavior: Behavior normal.       Assessment & Plan:   Problem List Items Addressed This Visit      Cardiovascular and Mediastinum   Essential hypertension   Relevant Orders   CMP14+EGFR     Digestive   GERD (gastroesophageal reflux disease)    Relevant Orders   CBC with Differential/Platelet     Endocrine   Type 2 diabetes mellitus with hyperlipidemia ([place]) - Primary   Relevant Orders   Bayer DCA Hb A1c Waived   CBC with Differential/Platelet   CMP14+EGFR     Other   Hyperlipidemia with target LDL less than 70   Relevant Orders   Lipid panel    Other Visit Diagnoses    Flu vaccine need       Relevant Orders   Flu Vaccine QUAD 6+ mos  PF IM (Fluarix Quad PF) (Completed)      Continue current medication, no changes.  Patient does not need any refills today but does want to switch everything over the evening drug eventually once he switches insurances within the coming year. Follow up plan: Return in about 3 months (around 07/27/2020), or if symptoms worsen or fail to improve, for Diabetes and hypertension and cholesterol.  Counseling provided for all of the vaccine components Orders Placed This Encounter  Procedures  . Flu Vaccine QUAD 6+ mos PF IM (Fluarix Quad PF)  . Bayer DCA Hb A1c Waived  . CBC with Differential/Platelet  . CMP14+EGFR  . Lipid panel    Caryl Pina, MD [facility] 04/26/2020, 9:54 AM

## 2020-04-27 LAB — LIPID PANEL
Chol/HDL Ratio: 4.5 ratio (ref 0.0–5.0)
Cholesterol, Total: 121 mg/dL (ref 100–199)
HDL: 27 mg/dL — ABNORMAL LOW (ref >39)
LDL Chol Calc (NIH): 59 mg/dL (ref 0–99)
Triglycerides: 212 mg/dL — ABNORMAL HIGH (ref 0–149)
VLDL Cholesterol Cal: 35 mg/dL (ref 5–40)

## 2020-04-27 LAB — CMP14+EGFR
ALT: 37 IU/L (ref 0–44)
AST: 27 IU/L (ref 0–40)
Albumin/Globulin Ratio: 1.8 (ref 1.2–2.2)
Albumin: 4.6 g/dL (ref 3.8–4.8)
Alkaline Phosphatase: 68 IU/L (ref 44–121)
BUN/Creatinine Ratio: 19 (ref 10–24)
BUN: 27 mg/dL (ref 8–27)
Bilirubin Total: 0.4 mg/dL (ref 0.0–1.2)
CO2: 19 mmol/L — ABNORMAL LOW (ref 20–29)
Calcium: 9.4 mg/dL (ref 8.6–10.2)
Chloride: 105 mmol/L (ref 96–106)
Creatinine, Ser: 1.39 mg/dL — ABNORMAL HIGH (ref 0.76–1.27)
GFR calc Af Amer: 61 mL/min/{1.73_m2} (ref >59)
GFR calc non Af Amer: 53 mL/min/{1.73_m2} — ABNORMAL LOW (ref >59)
Globulin, Total: 2.6 g/dL (ref 1.5–4.5)
Glucose: 145 mg/dL — ABNORMAL HIGH (ref 65–99)
Potassium: 4.9 mmol/L (ref 3.5–5.2)
Sodium: 143 mmol/L (ref 134–144)
Total Protein: 7.2 g/dL (ref 6.0–8.5)

## 2020-04-27 LAB — CBC WITH DIFFERENTIAL/PLATELET
Basophils Absolute: 0.1 10*3/uL (ref 0.0–0.2)
Basos: 1 %
EOS (ABSOLUTE): 0.3 10*3/uL (ref 0.0–0.4)
Eos: 4 %
Hematocrit: 35.6 % — ABNORMAL LOW (ref 37.5–51.0)
Hemoglobin: 11.8 g/dL — ABNORMAL LOW (ref 13.0–17.7)
Immature Grans (Abs): 0 10*3/uL (ref 0.0–0.1)
Immature Granulocytes: 0 %
Lymphocytes Absolute: 1.5 10*3/uL (ref 0.7–3.1)
Lymphs: 25 %
MCH: 32.7 pg (ref 26.6–33.0)
MCHC: 33.1 g/dL (ref 31.5–35.7)
MCV: 99 fL — ABNORMAL HIGH (ref 79–97)
Monocytes Absolute: 0.5 10*3/uL (ref 0.1–0.9)
Monocytes: 9 %
Neutrophils Absolute: 3.5 10*3/uL (ref 1.4–7.0)
Neutrophils: 61 %
Platelets: 222 10*3/uL (ref 150–450)
RBC: 3.61 x10E6/uL — ABNORMAL LOW (ref 4.14–5.80)
RDW: 12.3 % (ref 11.6–15.4)
WBC: 5.9 10*3/uL (ref 3.4–10.8)

## 2020-05-07 ENCOUNTER — Other Ambulatory Visit: Payer: Self-pay | Admitting: Family Medicine

## 2020-05-15 DIAGNOSIS — E291 Testicular hypofunction: Secondary | ICD-10-CM | POA: Diagnosis not present

## 2020-05-15 DIAGNOSIS — Z87442 Personal history of urinary calculi: Secondary | ICD-10-CM | POA: Diagnosis not present

## 2020-05-17 ENCOUNTER — Other Ambulatory Visit: Payer: Self-pay | Admitting: Family Medicine

## 2020-06-07 ENCOUNTER — Encounter: Payer: Self-pay | Admitting: Family Medicine

## 2020-07-08 ENCOUNTER — Encounter: Payer: Self-pay | Admitting: Family Medicine

## 2020-07-08 MED ORDER — ATORVASTATIN CALCIUM 40 MG PO TABS
40.0000 mg | ORAL_TABLET | Freq: Every day | ORAL | 0 refills | Status: DC
Start: 2020-07-08 — End: 2020-07-29

## 2020-07-08 MED ORDER — ATENOLOL 25 MG PO TABS
25.0000 mg | ORAL_TABLET | Freq: Every day | ORAL | 0 refills | Status: DC
Start: 2020-07-08 — End: 2020-07-29

## 2020-07-08 MED ORDER — METFORMIN HCL 1000 MG PO TABS
1000.0000 mg | ORAL_TABLET | Freq: Two times a day (BID) | ORAL | 0 refills | Status: DC
Start: 2020-07-08 — End: 2020-07-29

## 2020-07-15 DIAGNOSIS — M25561 Pain in right knee: Secondary | ICD-10-CM | POA: Diagnosis not present

## 2020-07-29 ENCOUNTER — Ambulatory Visit (INDEPENDENT_AMBULATORY_CARE_PROVIDER_SITE_OTHER): Payer: HMO | Admitting: Family Medicine

## 2020-07-29 ENCOUNTER — Other Ambulatory Visit: Payer: Self-pay

## 2020-07-29 ENCOUNTER — Encounter: Payer: Self-pay | Admitting: Family Medicine

## 2020-07-29 VITALS — BP 130/65 | HR 69 | Ht 70.0 in | Wt 203.0 lb

## 2020-07-29 DIAGNOSIS — E1169 Type 2 diabetes mellitus with other specified complication: Secondary | ICD-10-CM | POA: Diagnosis not present

## 2020-07-29 DIAGNOSIS — E785 Hyperlipidemia, unspecified: Secondary | ICD-10-CM

## 2020-07-29 DIAGNOSIS — I10 Essential (primary) hypertension: Secondary | ICD-10-CM | POA: Diagnosis not present

## 2020-07-29 LAB — LIPID PANEL
Chol/HDL Ratio: 3.9 ratio (ref 0.0–5.0)
Cholesterol, Total: 110 mg/dL (ref 100–199)
HDL: 28 mg/dL — ABNORMAL LOW (ref >39)
LDL Chol Calc (NIH): 60 mg/dL (ref 0–99)
Triglycerides: 122 mg/dL (ref 0–149)
VLDL Cholesterol Cal: 22 mg/dL (ref 5–40)

## 2020-07-29 LAB — CMP14+EGFR
ALT: 50 IU/L — ABNORMAL HIGH (ref 0–44)
AST: 38 IU/L (ref 0–40)
Albumin/Globulin Ratio: 1.7 (ref 1.2–2.2)
Albumin: 4.7 g/dL (ref 3.8–4.8)
Alkaline Phosphatase: 80 IU/L (ref 44–121)
BUN/Creatinine Ratio: 19 (ref 10–24)
BUN: 27 mg/dL (ref 8–27)
Bilirubin Total: 0.4 mg/dL (ref 0.0–1.2)
CO2: 22 mmol/L (ref 20–29)
Calcium: 9.8 mg/dL (ref 8.6–10.2)
Chloride: 104 mmol/L (ref 96–106)
Creatinine, Ser: 1.41 mg/dL — ABNORMAL HIGH (ref 0.76–1.27)
GFR calc Af Amer: 60 mL/min/{1.73_m2} (ref >59)
GFR calc non Af Amer: 52 mL/min/{1.73_m2} — ABNORMAL LOW (ref >59)
Globulin, Total: 2.8 g/dL (ref 1.5–4.5)
Glucose: 168 mg/dL — ABNORMAL HIGH (ref 65–99)
Potassium: 5.4 mmol/L — ABNORMAL HIGH (ref 3.5–5.2)
Sodium: 143 mmol/L (ref 134–144)
Total Protein: 7.5 g/dL (ref 6.0–8.5)

## 2020-07-29 LAB — CBC WITH DIFFERENTIAL/PLATELET
Basophils Absolute: 0 10*3/uL (ref 0.0–0.2)
Basos: 1 %
EOS (ABSOLUTE): 0.1 10*3/uL (ref 0.0–0.4)
Eos: 2 %
Hematocrit: 37.7 % (ref 37.5–51.0)
Hemoglobin: 12.7 g/dL — ABNORMAL LOW (ref 13.0–17.7)
Immature Grans (Abs): 0 10*3/uL (ref 0.0–0.1)
Immature Granulocytes: 0 %
Lymphocytes Absolute: 1.4 10*3/uL (ref 0.7–3.1)
Lymphs: 23 %
MCH: 32.6 pg (ref 26.6–33.0)
MCHC: 33.7 g/dL (ref 31.5–35.7)
MCV: 97 fL (ref 79–97)
Monocytes Absolute: 0.5 10*3/uL (ref 0.1–0.9)
Monocytes: 8 %
Neutrophils Absolute: 4.2 10*3/uL (ref 1.4–7.0)
Neutrophils: 66 %
Platelets: 198 10*3/uL (ref 150–450)
RBC: 3.89 x10E6/uL — ABNORMAL LOW (ref 4.14–5.80)
RDW: 12.3 % (ref 11.6–15.4)
WBC: 6.3 10*3/uL (ref 3.4–10.8)

## 2020-07-29 LAB — BAYER DCA HB A1C WAIVED: HB A1C (BAYER DCA - WAIVED): 8.1 % — ABNORMAL HIGH (ref <7.0)

## 2020-07-29 MED ORDER — RYBELSUS 7 MG PO TABS: 7.0000 mg | ORAL_TABLET | Freq: Every day | ORAL | 5 refills | Status: DC

## 2020-07-29 MED ORDER — ATORVASTATIN CALCIUM 40 MG PO TABS: 40.0000 mg | ORAL_TABLET | Freq: Every day | ORAL | 3 refills | Status: DC

## 2020-07-29 MED ORDER — PROMETHAZINE HCL 25 MG PO TABS: ORAL_TABLET | ORAL | 3 refills | Status: DC

## 2020-07-29 MED ORDER — GLIPIZIDE 5 MG PO TABS: ORAL_TABLET | ORAL | 3 refills | Status: DC

## 2020-07-29 MED ORDER — LISINOPRIL 20 MG PO TABS: 20.0000 mg | ORAL_TABLET | Freq: Every day | ORAL | 3 refills | Status: DC

## 2020-07-29 MED ORDER — ATENOLOL 25 MG PO TABS: 25.0000 mg | ORAL_TABLET | Freq: Every day | ORAL | 3 refills | Status: DC

## 2020-07-29 MED ORDER — METFORMIN HCL 1000 MG PO TABS: 1000.0000 mg | ORAL_TABLET | Freq: Two times a day (BID) | ORAL | 3 refills | Status: DC

## 2020-07-29 MED ORDER — DAPAGLIFLOZIN PROPANEDIOL 10 MG PO TABS: 10.0000 mg | ORAL_TABLET | Freq: Every day | ORAL | 5 refills | Status: DC

## 2020-07-29 MED ORDER — BLOOD GLUCOSE MONITOR KIT: PACK | 0 refills | Status: AC

## 2020-07-29 MED ORDER — TOUJEO SOLOSTAR 300 UNIT/ML ~~LOC~~ SOPN: 10.0000 [IU] | PEN_INJECTOR | Freq: Every day | SUBCUTANEOUS | 3 refills | Status: DC

## 2020-07-29 NOTE — Addendum Note (Signed)
Addended by: Dorene Sorrow on: 07/29/2020 11:23 AM   Modules accepted: Orders

## 2020-07-29 NOTE — Progress Notes (Signed)
BP 130/65   Pulse 69   Ht 5\' 10"  (1.778 m)   Wt 203 lb (92.1 kg)   SpO2 99%   BMI 29.13 kg/m    Subjective:   Patient ID: [name] [name], male    DOB: [birth year], 65 y.o.   MRN: [id number]  HPI: [name] [name] is a 65 y.o. male presenting on 07/29/2020 for Medical Management of Chronic Issues, Diabetes, Hyperlipidemia, and Hypertension   HPI Type 2 diabetes mellitus Patient comes in today for recheck of his diabetes. Patient has been currently taking Toujeo 18 units daily Rybelsus 14 and glipizide and Metformin. Patient is currently on an ACE inhibitor/ARB. Patient has not seen an ophthalmologist this year. Patient denies any issues with their feet. The symptom started onset as an adult hypertension and hyperlipidemia and CAD ARE RELATED TO DM   Hypertension Patient is currently on atenolol and lisinopril, and their blood pressure today is 130/65. Patient denies any lightheadedness or dizziness. Patient denies headaches, blurred vision, chest pains, shortness of breath, or weakness. Denies any side effects from medication and is content with current medication.   Hyperlipidemia Patient is coming in for recheck of his hyperlipidemia. The patient is currently taking atorvastatin. They deny any issues with myalgias or history of liver damage from it. They deny any focal numbness or weakness or chest pain.   Relevant past medical, surgical, family and social history reviewed and updated as indicated. Interim medical history since our last visit reviewed. Allergies and medications reviewed and updated.  Review of Systems  Constitutional: Negative for chills and fever.  Respiratory: Negative for shortness of breath and wheezing.   Cardiovascular: Negative for chest pain and leg swelling.  Musculoskeletal: Negative for back pain and gait problem.  Skin: Negative for rash.  Neurological: Positive for numbness (Patient gets a little numbness on his left anterior shin down to his foot, he  says it happens intermittently and only in certain positions like laying and sitting there but is slightly more frequent than it was before.  He denies any back pain). Negative for dizziness, weakness and light-headedness.  All other systems reviewed and are negative.   Per HPI unless specifically indicated above   Allergies as of 07/29/2020      Reactions   Glyxambi [empagliflozin-linagliptin] Diarrhea, Itching   He was able to take Jardiance alone with no side effects so it was the Tradjenta part that most likely gave him the reaction   Hctz [hydrochlorothiazide]    Nausea and headache    Sulfa Antibiotics Nausea And Vomiting   Sulfonamide Derivatives       Medication List       Accurate as of July 29, 2020 11:08 AM. If you have any questions, ask your nurse or doctor.        aspirin EC 81 MG tablet Take 81 mg by mouth daily.   atenolol 25 MG tablet Commonly known as: TENORMIN Take 1 tablet (25 mg total) by mouth daily.   atorvastatin 40 MG tablet Commonly known as: LIPITOR Take 1 tablet (40 mg total) by mouth daily.   B-D UF III MINI PEN NEEDLES 31G X 5 MM Misc Generic drug: Insulin Pen Needle 1 each by Does not apply route daily.   cholecalciferol 1000 units tablet Commonly known as: VITAMIN D Take 2,000 Units by mouth daily.   dapagliflozin propanediol 10 MG Tabs tablet Commonly known as: Farxiga Take 1 tablet (10 mg total) by mouth daily before breakfast. Started by:  [name] Iara Monds, MD   fexofenadine 180 MG tablet Commonly known as: ALLEGRA Take 180 mg by mouth daily.   fluticasone 50 MCG/ACT nasal spray Commonly known as: FLONASE Place 2 sprays into the nose daily as needed for allergies.   GAS-X PO Take 1 capsule by mouth daily.   glipiZIDE 5 MG tablet Commonly known as: GLUCOTROL TAKE 2 TABLETS TWICE A DAY BEFORE MEALS   lisinopril 20 MG tablet Commonly known as: ZESTRIL Take 1 tablet (20 mg total) by mouth daily.   metFORMIN 1000 MG  tablet Commonly known as: GLUCOPHAGE Take 1 tablet (1,000 mg total) by mouth 2 (two) times daily with a meal.   metroNIDAZOLE 250 MG tablet Commonly known as: FLAGYL Take 1 tablet (250 mg total) by mouth daily as needed (abdominal pain).   promethazine 25 MG tablet Commonly known as: PHENERGAN TAKE 1 TABLET EVERY 4 HOURS AS NEEDED   Rybelsus 7 MG Tabs Generic drug: Semaglutide Take 7 mg by mouth daily. What changed:   medication strength  how much to take Changed by: [name] [name], MD   temazepam 15 MG capsule Commonly known as: RESTORIL TAKE ONE CAPSULE BY MOUTH AT BEDTIME AS NEEDED   Toujeo SoloStar 300 UNIT/ML Solostar Pen Generic drug: insulin glargine (1 Unit Dial) Inject 10-20 Units into the skin daily.        Objective:   BP 130/65   Pulse 69   Ht 5\' 10"  (1.778 m)   Wt 203 lb (92.1 kg)   SpO2 99%   BMI 29.13 kg/m   Wt Readings from Last 3 Encounters:  07/29/20 203 lb (92.1 kg)  04/26/20 203 lb (92.1 kg)  03/20/20 200 lb 4.8 oz (90.9 kg)    Physical Exam Vitals and nursing note reviewed.  Constitutional:      General: He is not in acute distress.    Appearance: He is well-developed and well-nourished. He is not diaphoretic.  Eyes:     General: No scleral icterus.    Extraocular Movements: EOM normal.     Conjunctiva/sclera: Conjunctivae normal.  Neck:     Thyroid: No thyromegaly.  Cardiovascular:     Rate and Rhythm: Normal rate and regular rhythm.     Pulses: Intact distal pulses.     Heart sounds: Normal heart sounds. No murmur heard.   Pulmonary:     Effort: Pulmonary effort is normal. No respiratory distress.     Breath sounds: Normal breath sounds. No wheezing.  Musculoskeletal:        General: No edema. Normal range of motion.     Cervical back: Neck supple.     Lumbar back: Normal. No deformity, lacerations, spasms, tenderness or bony tenderness. Normal range of motion. Negative right straight leg raise test and negative left  straight leg raise test. No scoliosis.  Lymphadenopathy:     Cervical: No cervical adenopathy.  Skin:    General: Skin is warm and dry.     Findings: No rash.  Neurological:     Mental Status: He is alert and oriented to person, place, and time.     Coordination: Coordination normal.  Psychiatric:        Mood and Affect: Mood and affect normal.        Behavior: Behavior normal.       Assessment & Plan:   Problem List Items Addressed This Visit      Cardiovascular and Mediastinum   Essential hypertension   Relevant Medications   lisinopril (  ZESTRIL) 20 MG tablet   atorvastatin (LIPITOR) 40 MG tablet   atenolol (TENORMIN) 25 MG tablet   Other Relevant Orders   Bayer DCA Hb A1c Waived   CBC with Differential/Platelet   CMP14+EGFR   Lipid panel     Endocrine   Type 2 diabetes mellitus with hyperlipidemia ([facility]) - Primary   Relevant Medications   glipiZIDE (GLUCOTROL) 5 MG tablet   lisinopril (ZESTRIL) 20 MG tablet   metFORMIN (GLUCOPHAGE) 1000 MG tablet   insulin glargine, 1 Unit Dial, (TOUJEO SOLOSTAR) 300 UNIT/ML Solostar Pen   atorvastatin (LIPITOR) 40 MG tablet   atenolol (TENORMIN) 25 MG tablet   Semaglutide (RYBELSUS) 7 MG TABS   dapagliflozin propanediol (FARXIGA) 10 MG TABS tablet   Other Relevant Orders   Bayer DCA Hb A1c Waived   CBC with Differential/Platelet   CMP14+EGFR   Lipid panel     Other   Hyperlipidemia with target LDL less than 70   Relevant Medications   lisinopril (ZESTRIL) 20 MG tablet   atorvastatin (LIPITOR) 40 MG tablet   atenolol (TENORMIN) 25 MG tablet   Other Relevant Orders   Bayer DCA Hb A1c Waived   CBC with Differential/Platelet   CMP14+EGFR   Lipid panel      Added Farxiga, reduced Rybelsus because of his tolerance will try the Farxiga to try and get better control.  A1c is 8.1 today. No other change in medicine.  Follow-up in Follow up plan: Return in about 3 months (around 10/27/2020), or if symptoms worsen or fail to  improve, for Diabetes and cholesterol.  Counseling provided for all of the vaccine components Orders Placed This Encounter  Procedures  . Bayer DCA Hb A1c Waived  . CBC with Differential/Platelet  . CMP14+EGFR  . Lipid panel    Arville Care, MD [facility] 07/29/2020, 11:08 AM

## 2020-07-29 NOTE — Patient Instructions (Signed)
Will start patient on Farxiga little bit of diarrhea with Jardiance also so we will cut that back to 7 and start the [place]

## 2020-08-06 ENCOUNTER — Telehealth: Payer: Self-pay | Admitting: *Deleted

## 2020-08-06 DIAGNOSIS — E1169 Type 2 diabetes mellitus with other specified complication: Secondary | ICD-10-CM

## 2020-08-06 NOTE — Telephone Encounter (Signed)
Farxiga 10 mg TAB  PA came over from [facility] fax Form filled out and placed on [employer] desk for signature.   Will have him return to PA box/ [facility]

## 2020-08-12 NOTE — Telephone Encounter (Signed)
PA approved through 07/05/2021. Pharmacy aware.

## 2020-09-26 ENCOUNTER — Ambulatory Visit: Payer: HMO | Admitting: Physician Assistant

## 2020-09-26 ENCOUNTER — Encounter: Payer: Self-pay | Admitting: Physician Assistant

## 2020-09-26 ENCOUNTER — Other Ambulatory Visit: Payer: Self-pay

## 2020-09-26 ENCOUNTER — Ambulatory Visit (HOSPITAL_COMMUNITY)
Admission: RE | Admit: 2020-09-26 | Discharge: 2020-09-26 | Disposition: A | Discharge status: Home / Self Care | Payer: HMO | Source: Ambulatory Visit | Attending: Vascular Surgery | Admitting: Vascular Surgery

## 2020-09-26 VITALS — BP 132/76 | HR 78 | Temp 98.3°F | Resp 20 | Ht 70.0 in | Wt 203.7 lb

## 2020-09-26 DIAGNOSIS — I6523 Occlusion and stenosis of bilateral carotid arteries: Secondary | ICD-10-CM | POA: Diagnosis not present

## 2020-09-26 DIAGNOSIS — I6521 Occlusion and stenosis of right carotid artery: Secondary | ICD-10-CM | POA: Insufficient documentation

## 2020-09-26 NOTE — Progress Notes (Signed)
Office Note     CC:  follow up Requesting Provider:  Dettinger, Fransisca Kaufmann, MD  HPI: [name] [name] is a 65 y.o. (1955-08-14) male who presents for surveillance follow up of carotid artery stenosis. He has been followed for bilateral 40-59% ICA stenosis. He has no history of TIA or stroke.   Today he denies any amaurosis fugax or other visual changes, slurred speech, facial drooping, unilateral weakness or numbness of upper or lower extremities. He does report that recently he started having numbness in his left leg from the knee down. He says this occurs intermittently and usually on prolonged sitting or when lying down. He is not having any pain in the legs, no pain on ambulation, color changes, swelling, or temperature changes.  The pt is on a statin for cholesterol management.  The pt is on a daily aspirin.   Other AC: none The pt is on ACE and BB for hypertension.   The pt is diabetic.  Tobacco hx: Former, quit 2004  Past Medical History:  Diagnosis Date  . Aortic stenosis 8/09   Bicuspid aortic valve  . Arthritis 6/10   hand   . Atrial fibrillation ([place])    post op only  . Bile duct calculus with acute cholecystitis   . CAD (coronary artery disease) 8/09  . Calculus of common bile duct with acute pancreatitis 11/2016  . CTS (carpal tunnel syndrome)    bilateral   . Diabetes ([place])   . Diastolic dysfunction 6/21  . Elevated LFTs   . Fatty liver disease, nonalcoholic   . History of kidney stones   . Hyperlipidemia   . Hypertension   . Kidney stones   . Nasal congestion   . NIDDM (non-insulin dependent diabetes mellitus)    x15 yrs  . OSA on CPAP 2006  . Paresthesias in left hand 02/19/2004  . Paresthesias in right hand 02/19/2004  . Sinusitis   . Vitamin D deficiency 06/13/09    Past Surgical History:  Procedure Laterality Date  . bladder cancer - operation - 01/21/18 - Dr Tresa Dawkins  01/21/2018  . CARPAL TUNNEL RELEASE     bilateral   . CHOLECYSTECTOMY  11/2016  .  COLONOSCOPY N/A 09/22/2018   Procedure: COLONOSCOPY;  Surgeon: Rogene Houston, MD;  Location: [facility];  Service: Endoscopy;  Laterality: N/A;  1:00  . CORONARY ANGIOPLASTY WITH STENT PLACEMENT     to rt coronary atery (Dr. Degert-cardiologist)  . CYSTOSCOPY WITH RETROGRADE PYELOGRAM, URETEROSCOPY AND STENT PLACEMENT Left 03/07/2018   Procedure: CYSTOSCOPY WITH RETROGRADE PYELOGRAM,  AND STENT PLACEMENT;  Surgeon: [name] Frock, MD;  Location: [facility];  Service: Urology;  Laterality: Left;  . ERCP N/A 11/24/2016   Procedure: ENDOSCOPIC RETROGRADE CHOLANGIOPANCREATOGRAPHY (ERCP);  Surgeon: Rogene Houston, MD;  Location: [facility];  Service: Endoscopy;  Laterality: N/A;  . EXTRACORPOREAL SHOCK WAVE LITHOTRIPSY Left 03/17/2018   Procedure: LEFT EXTRACORPOREAL SHOCK WAVE LITHOTRIPSY (ESWL);  Surgeon: Irine Seal, MD;  Location: [facility];  Service: Urology;  Laterality: Left;  . REMOVAL OF STONES N/A 11/24/2016   Procedure: REMOVAL OF STONES;  Surgeon: Rogene Houston, MD;  Location: [facility];  Service: Endoscopy;  Laterality: N/A;  . SPHINCTEROTOMY N/A 11/24/2016   Procedure: SPHINCTEROTOMY;  Surgeon: Rogene Houston, MD;  Location: [facility];  Service: Endoscopy;  Laterality: N/A;  . TISSUE AORTIC VALVE REPLACEMENT     2012    Social History   Socioeconomic History  . Marital  status: Married    Spouse name: Not on file  . Number of children: Not on file  . Years of education: Not on file  . Highest education level: Not on file  Occupational History  . Not on file  Tobacco Use  . Smoking status: Former Smoker    Packs/day: 1.50    Years: 25.00    Pack years: 37.50    Types: Cigarettes    Start date: 07/06/1974    Quit date: 07/06/2002    Years since quitting: 18.2  . Smokeless tobacco: Never Used  Vaping Use  . Vaping Use: Never used  Substance and Sexual Activity  . Alcohol use: No    Alcohol/week: 0.0 standard drinks  . Drug use: No  . Sexual activity: Not on  file  Other Topics Concern  . Not on file  Social History Narrative  . Not on file   Social Determinants of Health   Financial Resource Strain: Not on file  Food Insecurity: Not on file  Transportation Needs: Not on file  Physical Activity: Not on file  Stress: Not on file  Social Connections: Not on file  Intimate Partner Violence: Not on file    Family History  Problem Relation Age of Onset  . Hearing loss Mother   . Alzheimer's disease Mother   . Hearing loss Father   . Diabetes Father   . Diabetes Other        Family History     Current Outpatient Medications  Medication Sig Dispense Refill  . aspirin EC 81 MG tablet Take 81 mg by mouth daily.    [name] atenolol (TENORMIN) 25 MG tablet Take 1 tablet (25 mg total) by mouth daily. 90 tablet 3  . atorvastatin (LIPITOR) 40 MG tablet Take 1 tablet (40 mg total) by mouth daily. 90 tablet 3  . blood glucose meter kit and supplies KIT Dispense based on patient and insurance preference. Use up to four times daily as directed. (FOR ICD-9 250.00, 250.01). 1 each 0  . cholecalciferol (VITAMIN D) 1000 UNITS tablet Take 2,000 Units by mouth daily.    . dapagliflozin propanediol (FARXIGA) 10 MG TABS tablet Take 1 tablet (10 mg total) by mouth daily before breakfast. 30 tablet 5  . fexofenadine (ALLEGRA) 180 MG tablet Take 180 mg by mouth daily.     . fluticasone (FLONASE) 50 MCG/ACT nasal spray Place 2 sprays into the nose daily as needed for allergies.     [name] glipiZIDE (GLUCOTROL) 5 MG tablet TAKE 2 TABLETS TWICE A DAY BEFORE MEALS 360 tablet 3  . insulin glargine, 1 Unit Dial, (TOUJEO SOLOSTAR) 300 UNIT/ML Solostar Pen Inject 10-20 Units into the skin daily. 6 mL 3  . Insulin Pen Needle (B-D UF III MINI PEN NEEDLES) 31G X 5 MM MISC 1 each by Does not apply route daily. 90 each 3  . Lancets (ONETOUCH DELICA PLUS LANCET33G) MISC 4 (four) times daily.    [name] lisinopril (ZESTRIL) 20 MG tablet Take 1 tablet (20 mg total) by mouth daily. 90 tablet  3  . metFORMIN (GLUCOPHAGE) 1000 MG tablet Take 1 tablet (1,000 mg total) by mouth 2 (two) times daily with a meal. 180 tablet 3  . metroNIDAZOLE (FLAGYL) 250 MG tablet Take 1 tablet (250 mg total) by mouth daily as needed (abdominal pain). 30 tablet 1  . ONETOUCH ULTRA test strip 4 (four) times daily.    . promethazine (PHENERGAN) 25 MG tablet TAKE 1 TABLET EVERY 4 HOURS AS  NEEDED 30 tablet 3  . Semaglutide (RYBELSUS) 7 MG TABS Take 7 mg by mouth daily. 30 tablet 5  . Simethicone (GAS-X PO) Take 1 capsule by mouth daily.    . sodium fluoride (FLUORISHIELD) 1.1 % GEL dental gel PreviDent 5000 Dry Mouth 1.1 % dental paste    . temazepam (RESTORIL) 15 MG capsule TAKE ONE CAPSULE BY MOUTH AT BEDTIME AS NEEDED 30 capsule 1   No current facility-administered medications for this visit.    Allergies  Allergen Reactions  . Glyxambi [Empagliflozin-Linagliptin] Diarrhea and Itching    He was able to take Jardiance alone with no side effects so it was the Tradjenta part that most likely gave him the reaction  . Hctz [Hydrochlorothiazide]     Nausea and headache   . Sulfa Antibiotics Nausea And Vomiting  . Sulfonamide Derivatives      REVIEW OF SYSTEMS:  $RemoveB'[X]'rzIeUJCI$  denotes positive finding, $RemoveBeforeDEI'[ ]'eHTlOAeAMpSKCejo$  denotes negative finding Cardiac  Comments:  Chest pain or chest pressure:    Shortness of breath upon exertion:    Short of breath when lying flat:    Irregular heart rhythm:        Vascular    Pain in calf, thigh, or hip brought on by ambulation:    Pain in feet at night that wakes you up from your sleep:     Blood clot in your veins:    Leg swelling:         Pulmonary    Oxygen at home:    Productive cough:     Wheezing:         Neurologic    Sudden weakness in arms or legs:     Sudden numbness in arms or legs:     Sudden onset of difficulty speaking or slurred speech:    Temporary loss of vision in one eye:     Problems with dizziness:         Gastrointestinal    Blood in stool:      Vomited blood:         Genitourinary    Burning when urinating:     Blood in urine:        Psychiatric    Major depression:         Hematologic    Bleeding problems:    Problems with blood clotting too easily:        Skin    Rashes or ulcers:        Constitutional    Fever or chills:      PHYSICAL EXAMINATION:  Vitals:   09/26/20 1108 09/26/20 1110  BP: 126/77 132/76  Pulse: 78   Resp: 20   Temp: 98.3 F (36.8 C)   TempSrc: Temporal   SpO2: 98%   Weight: 203 lb 11.2 oz (92.4 kg)   Height: $Remove'5\' 10"'DWSbHRG$  (1.778 m)     General:  WDWN in NAD; vital signs documented above Gait: Normal HENT: WNL, normocephalic Pulmonary: normal non-labored breathing , without  wheezing Cardiac: regular HR, without  Murmurs without carotid bruit Abdomen: soft, NT, no masses Vascular Exam/Pulses:2+ radial pulses bilaterally, 2+ DP and PT pulses bilaterally Extremities: without ischemic changes, without Gangrene , without cellulitis; without open wounds;  Musculoskeletal: no muscle wasting or atrophy/ 5/5 grip strength equal bilaterally Neurologic: A&O X 3;  No focal weakness or paresthesias are detected Psychiatric:  The pt has Normal affect.   Non-Invasive Vascular Imaging:   09/26/20 Right Carotid: Velocities in the right  ICA are consistent with a 40-59% stenosis.   Left Carotid: Velocities in the left ICA are consistent with a 40-59% stenosis.   Vertebrals: Bilateral vertebral arteries demonstrate antegrade flow.     ASSESSMENT/PLAN:: 65 y.o. male here for follow up for bilateral carotid artery stenosis. His duplex today shows bilateral 40-59% ICA stenosis. This is essentially unchanged from prior study. He remains without symptoms at this time - He will continue his Aspirin and Statin - Reviewed TIA/ Stroke symptoms and patient understands should these occur he should go to the ER - He will follow up in 1 year with carotid duplex   Karoline Caldwell, PA-C [facility] [phone]  Clinic MD:  Laqueta Due

## 2020-10-15 ENCOUNTER — Telehealth: Payer: Self-pay

## 2020-10-15 DIAGNOSIS — E1169 Type 2 diabetes mellitus with other specified complication: Secondary | ICD-10-CM

## 2020-10-15 NOTE — Telephone Encounter (Signed)
Received a PA for Vascepa 1GM- not on current medication list.  Please advise if PA needs to be started.   Key: [id number] Vascepa 1GM capsules

## 2020-10-16 ENCOUNTER — Encounter: Payer: Self-pay | Admitting: Cardiology

## 2020-10-16 ENCOUNTER — Ambulatory Visit (INDEPENDENT_AMBULATORY_CARE_PROVIDER_SITE_OTHER): Payer: HMO | Admitting: Cardiology

## 2020-10-16 ENCOUNTER — Other Ambulatory Visit: Payer: Self-pay

## 2020-10-16 ENCOUNTER — Other Ambulatory Visit: Payer: Self-pay | Admitting: Family Medicine

## 2020-10-16 VITALS — BP 130/64 | HR 68 | Ht 70.0 in | Wt 204.6 lb

## 2020-10-16 DIAGNOSIS — I7121 Aneurysm of the ascending aorta, without rupture: Secondary | ICD-10-CM

## 2020-10-16 DIAGNOSIS — I712 Thoracic aortic aneurysm, without rupture: Secondary | ICD-10-CM | POA: Diagnosis not present

## 2020-10-16 DIAGNOSIS — I10 Essential (primary) hypertension: Secondary | ICD-10-CM | POA: Diagnosis not present

## 2020-10-16 DIAGNOSIS — Z952 Presence of prosthetic heart valve: Secondary | ICD-10-CM

## 2020-10-16 MED ORDER — ICOSAPENT ETHYL 1 G PO CAPS: 2.0000 g | ORAL_CAPSULE | Freq: Two times a day (BID) | ORAL | 3 refills | Status: DC

## 2020-10-16 NOTE — Progress Notes (Signed)
   Clinical Summary [name] [name] is a 64 y.o.male seen today for follow up of the following medical problems   1. AVR - history of bicuspid AV, s/p bioprosthetic AVR in 2012  - echo 11/2016 LVEF 60-65%, no WMAs, normal diastolic function, mild gradient across AVR.   - no recent SOB/DOE, no chest pains.     2. Ascending aortic aneurysm - mild aortic root dilatation at 4.1 to 4.2 cm by last MRI 10/2013, likely related to hx of bicuspid AV and potential coexisting aortopathy - CTA 10/2014 4.1 cm ascending aortic aneurysm.  - CTA 5/2018stable 4 cm aneurysm  10/2017 CTA Chest/Abd/Pelvis Sovah (in epic labeled as CT A/P): 4 cm aneurysm - no recent chest pain  01/2019 CTA stable 3.7 cm(reported overestimated by prior imaging) 01/2020 CTA 3.9 cm acending aorta, overall stable  3. HTN - on higher dose of lisinopril had symptomatic low bp's -he is compliant with meds   4. OSA - not compliant with CPAP due to discomfort. Has not been intrested in retyring   5. Hyperlipidemia - Jan 2022 TC 110 TG 122 HDL 28 LDL 60 - compliant with statin  6. DM2 - followed by pcp    7. Pulmonary Nodules -have been stable by imaging, monitored along with his aneurysm with his annual CT chests  8. Carotid stenosis - followed by vascular   Has one grandchild, 6 yo girl  Past Medical History:  Diagnosis Date  . Aortic stenosis 8/09   Bicuspid aortic valve  . Arthritis 6/10   hand   . Atrial fibrillation (HCC)    post op only  . Bile duct calculus with acute cholecystitis   . CAD (coronary artery disease) 8/09  . Calculus of common bile duct with acute pancreatitis 11/2016  . CTS (carpal tunnel syndrome)    bilateral   . Diabetes (HCC)   . Diastolic dysfunction 8/09  . Elevated LFTs   . Fatty liver disease, nonalcoholic   . History of kidney stones   . Hyperlipidemia   . Hypertension   . Kidney stones   . Nasal congestion   . NIDDM (non-insulin dependent  diabetes mellitus)    x15 yrs  . OSA on CPAP 2006  . Paresthesias in left hand 02/19/2004  . Paresthesias in right hand 02/19/2004  . Sinusitis   . Vitamin D deficiency 06/13/09     Allergies  Allergen Reactions  . Glyxambi [Empagliflozin-Linagliptin] Diarrhea and Itching    He was able to take Jardiance alone with no side effects so it was the Tradjenta part that most likely gave him the reaction  . Hctz [Hydrochlorothiazide]     Nausea and headache   . Sulfa Antibiotics Nausea And Vomiting  . Sulfonamide Derivatives      Current Outpatient Medications  Medication Sig Dispense Refill  . aspirin EC 81 MG tablet Take 81 mg by mouth daily.    . atenolol (TENORMIN) 25 MG tablet Take 1 tablet (25 mg total) by mouth daily. 90 tablet 3  . atorvastatin (LIPITOR) 40 MG tablet Take 1 tablet (40 mg total) by mouth daily. 90 tablet 3  . blood glucose meter kit and supplies KIT Dispense based on patient and insurance preference. Use up to four times daily as directed. (FOR ICD-9 250.00, 250.01). 1 each 0  . cholecalciferol (VITAMIN D) 1000 UNITS tablet Take 2,000 Units by mouth daily.    . dapagliflozin propanediol (FARXIGA) 10 MG TABS tablet Take 1 tablet (10 mg total)   by mouth daily before breakfast. 30 tablet 5  . fexofenadine (ALLEGRA) 180 MG tablet Take 180 mg by mouth daily.     . fluticasone (FLONASE) 50 MCG/ACT nasal spray Place 2 sprays into the nose daily as needed for allergies.     . glipiZIDE (GLUCOTROL) 5 MG tablet TAKE 2 TABLETS TWICE A DAY BEFORE MEALS 360 tablet 3  . insulin glargine, 1 Unit Dial, (TOUJEO SOLOSTAR) 300 UNIT/ML Solostar Pen Inject 10-20 Units into the skin daily. 6 mL 3  . Insulin Pen Needle (B-D UF III MINI PEN NEEDLES) 31G X 5 MM MISC 1 each by Does not apply route daily. 90 each 3  . Lancets (ONETOUCH DELICA PLUS LANCET33G) MISC 4 (four) times daily.    . lisinopril (ZESTRIL) 20 MG tablet Take 1 tablet (20 mg total) by mouth daily. 90 tablet 3  . metFORMIN  (GLUCOPHAGE) 1000 MG tablet Take 1 tablet (1,000 mg total) by mouth 2 (two) times daily with a meal. 180 tablet 3  . metroNIDAZOLE (FLAGYL) 250 MG tablet Take 1 tablet (250 mg total) by mouth daily as needed (abdominal pain). 30 tablet 1  . ONETOUCH ULTRA test strip 4 (four) times daily.    . promethazine (PHENERGAN) 25 MG tablet TAKE 1 TABLET EVERY 4 HOURS AS NEEDED 30 tablet 3  . Semaglutide (RYBELSUS) 7 MG TABS Take 7 mg by mouth daily. 30 tablet 5  . Simethicone (GAS-X PO) Take 1 capsule by mouth daily.    . sodium fluoride (FLUORISHIELD) 1.1 % GEL dental gel PreviDent 5000 Dry Mouth 1.1 % dental paste    . temazepam (RESTORIL) 15 MG capsule TAKE ONE CAPSULE BY MOUTH AT BEDTIME AS NEEDED 30 capsule 1   No current facility-administered medications for this visit.     Past Surgical History:  Procedure Laterality Date  . bladder cancer - operation - 01/21/18 - Dr [name] [name]  01/21/2018  . CARPAL TUNNEL RELEASE     bilateral   . CHOLECYSTECTOMY  11/2016  . COLONOSCOPY N/A 09/22/2018   Procedure: COLONOSCOPY;  Surgeon: [name] [name], [name] U, MD;  Location: [facility];  Service: Endoscopy;  Laterality: N/A;  1:00  . CORONARY ANGIOPLASTY WITH STENT PLACEMENT     to rt coronary atery (Dr. Degert-cardiologist)  . CYSTOSCOPY WITH RETROGRADE PYELOGRAM, URETEROSCOPY AND STENT PLACEMENT Left 03/07/2018   Procedure: CYSTOSCOPY WITH RETROGRADE PYELOGRAM,  AND STENT PLACEMENT;  Surgeon: [name] [name], Theodore, MD;  Location: [facility];  Service: Urology;  Laterality: Left;  . ERCP N/A 11/24/2016   Procedure: ENDOSCOPIC RETROGRADE CHOLANGIOPANCREATOGRAPHY (ERCP);  Surgeon: [name] [name], [name] U, MD;  Location: [facility];  Service: Endoscopy;  Laterality: N/A;  . EXTRACORPOREAL SHOCK WAVE LITHOTRIPSY Left 03/17/2018   Procedure: LEFT EXTRACORPOREAL SHOCK WAVE LITHOTRIPSY (ESWL);  Surgeon: [name] [name], John, MD;  Location: [facility];  Service: Urology;  Laterality: Left;  . REMOVAL OF STONES N/A 11/24/2016   Procedure: REMOVAL OF  STONES;  Surgeon: [name] [name], [name] U, MD;  Location: [facility];  Service: Endoscopy;  Laterality: N/A;  . SPHINCTEROTOMY N/A 11/24/2016   Procedure: SPHINCTEROTOMY;  Surgeon: [name] [name], [name] U, MD;  Location: [facility];  Service: Endoscopy;  Laterality: N/A;  . TISSUE AORTIC VALVE REPLACEMENT     2012     Allergies  Allergen Reactions  . Glyxambi [Empagliflozin-Linagliptin] Diarrhea and Itching    He was able to take Jardiance alone with no side effects so it was the Tradjenta part that most likely gave him the reaction  . Hctz [  Hydrochlorothiazide]     Nausea and headache   . Sulfa Antibiotics Nausea And Vomiting  . Sulfonamide Derivatives       Family History  Problem Relation Age of Onset  . Hearing loss Mother   . Alzheimer's disease Mother   . Hearing loss Father   . Diabetes Father   . Diabetes Other        Family History      Social History Mr. Baumgart reports that he quit smoking about 18 years ago. His smoking use included cigarettes. He started smoking about 46 years ago. He has a 37.50 pack-year smoking history. He has never used smokeless tobacco. Mr. Brodhead reports no history of alcohol use.   Review of Systems CONSTITUTIONAL: No weight loss, fever, chills, weakness or fatigue.  HEENT: Eyes: No visual loss, blurred vision, double vision or yellow sclerae.No hearing loss, sneezing, congestion, runny nose or sore throat.  SKIN: No rash or itching.  CARDIOVASCULAR: per hpi RESPIRATORY: No shortness of breath, cough or sputum.  GASTROINTESTINAL: No anorexia, nausea, vomiting or diarrhea. No abdominal pain or blood.  GENITOURINARY: No burning on urination, no polyuria NEUROLOGICAL: No headache, dizziness, syncope, paralysis, ataxia, numbness or tingling in the extremities. No change in bowel or bladder control.  MUSCULOSKELETAL: No muscle, back pain, joint pain or stiffness.  LYMPHATICS: No enlarged nodes. No history of splenectomy.  PSYCHIATRIC: No history of  depression or anxiety.  ENDOCRINOLOGIC: No reports of sweating, cold or heat intolerance. No polyuria or polydipsia.  [name]   Physical Examination Today's Vitals   10/16/20 0916  BP: 130/64  Pulse: 68  SpO2: 98%  Weight: 204 lb 9.6 oz (92.8 kg)  Height: 5' 10" (1.778 m)   Body mass index is 29.36 kg/m.  Gen: resting comfortably, no acute distress HEENT: no scleral icterus, pupils equal round and reactive, no palptable cervical adenopathy,  CV: RRR, no mrg, no jvd Resp: Clear to auscultation bilaterally GI: abdomen is soft, non-tender, non-distended, normal bowel sounds, no hepatosplenomegaly MSK: extremities are warm, no edema.  Skin: warm, no rash Neuro:  no focal deficits Psych: appropriate affect   Diagnostic Studies  09/2012 MRI/MRA Chest Stable 4 cm ascending thoracic aneurysm  08/2010 Cath HEMODYNAMIC DATA: Right atrial pressures 7/5 with a mean of 4 mmHg,  right ventricular pressure is 24 with an EDP of 7 mmHg. Pulmonary  artery pressure is 20/7 with a mean of 13 mmHg. Pulmonary capillary  wedge pressure is 10/7 with a mean of 6 mmHg. Aortic pressure is 118/67  with a mean of 88 mmHg. Left ventricular pressure is 161 with an EDP of  12 mmHg. There is no significant mitral valve gradient. The aortic  valve mean gradient is 31 mmHg with calculated valve area of 0.8  centimeter squared. Thermodilution cardiac output is 4.4 L per minute  with an index of 2, Fick cardiac outputs 4.7 L per minute with an index  of 2.2.  ANGIOGRAPHIC DATA: The left ventricular angiography was performed in a  RAO view. This demonstrates normal left ventricular size and  contractility with normal systolic function. Ejection fraction is  estimated at 65%. The aortic valve is calcified with eccentric opening  and restrictive mobility.  The left coronary artery arises and distributes normally. The left main  coronary has 20% narrowing in the ostium and in the distal left main.   The left anterior descending artery is a tortuous vessel which has less  than 10% irregularities.  Left circumflex coronary artery is  rise to first obtuse marginal vessel  which has a 40-50% stenosis proximally.  The right coronary artery is a dominant vessel, it has 20% disease in  the distal vessel. The stent in the midvessel was widely patent.  FINAL INTERPRETATION:  1. Nonobstructive atherosclerotic coronary artery disease.  2. Normal left ventricular function.  3. Severe aortic stenosis.  4. Normal right heart pressures.  PLAN: Proceed with aortic valve replacement.  10/2013 MRA Chest EXAM: MRA CHEST WITH OR WITHOUT CONTRAST  TECHNIQUE: Angiographic images of the chest were obtained using MRA technique without and with intravenous contrast.  CONTRAST: 20mL MULTIHANCE GADOBENATE DIMEGLUMINE 529 MG/ML IV SOLN  COMPARISON: CT UROGRAM dated 05/08/2011; MR MRA CHEST W/ OR W/O CM dated 10/07/2012; CT ANGIO CHEST dated 09/08/2010  FINDINGS: There is stable aneurysmal dilatation of the aortic root that does not involve the sinuses of Valsalva. No aortic dissection is present. Artifact is noted from a prosthetic aortic valve. Diameter at the level of the sinuses of Valsalva is approximately 3.2 cm. Maximal caliber of the ascending thoracic aorta is 4.1- 4.2 cm. The proximal arch measures 3.8 cm. The distal arch measures 2.6 cm. The descending thoracic aorta measures 2.6 cm. Proximal great vessels show stable and normal patency without anatomical variant.  The heart size is normal. No pleural or pericardial fluid is identified. No masses or lymphadenopathy are seen. Visualized spine shows no abnormalities.  IMPRESSION: Stable aneurysmal dilatation of the ascending thoracic aorta measuring 4.1- 4.2 cm in greatest diameter. No dissection is identified.  10/2014 CTA Chest IMPRESSION: 1. Mild fusiform aneurysmal dilatation of the ascending thoracic aorta  measuring approximately 41 mm in maximal diameter, stable since the 2012 examination. 2. Stable sequela of prior median sternotomy and aortic valve replacement. 3. Coronary artery calcifications. 4. Punctate (approximately 5 mm) in determine right lower lobe pulmonary nodule, not definitely seen on the 20/2012 examination. If the patient is at high risk for bronchogenic carcinoma, follow-up chest CT at 6-12 months is recommended. If the patient is at low risk for bronchogenic carcinoma, follow-up chest CT at 12 months is recommended. This recommendation follows the consensus statement:  11/2015 CTA chest  IMPRESSION: Mild aneurysmal dilatation of the ascending aorta with a maximal diameter of 4.0 cm. This is not significantly changed.   11/2016 echo Study Conclusions  - Left ventricle: The cavity size was normal. Wall thickness was normal. Systolic function was normal. The estimated ejection fraction was in the range of 60% to 65%. Wall motion was normal; there were no regional wall motion abnormalities. Left ventricular diastolic function parameters were normal. - Aortic valve: There isa 25-mm Edwards pericardial Magna-Ease valve model #[id number] in the AV position. Mildly calcified annulus. Trileaflet; normal thickness leaflets. There is a mild gradient across the prosthetic valve. Mean gradient (S): 12 mm Hg. Mean gradient of 18 mmHg with pedhoff probe. Valve area (VTI): 1.71 cm^2. - Technically adequate study.  11/2016 CTA chest IMPRESSION: 1. Stable mild fusiform aneurysm of the ascending aorta, measuring maximum diameter of 4.0 cm. Recommend annual imaging followup by CTA or MRA. This recommendation follows 2010 ACCF/AHA/AATS/ACR/ASA/SCA/SCAI/SIR/STS/SVM Guidelines for the Diagnosis and Management of Patients with Thoracic Aortic Disease. Circulation. 2010; 121: e266-e369 2. Long-term stability of pulmonary nodules, consistent with  benign process.   01/2019 Chest CTA IMPRESSION: 1. Stable appearance of ascending thoracic aorta. Maximal measurement is 3.7 cm. This was overestimated on axial imaging previously. Recommend annual imaging followup by CTA or MRA. This recommendation follows 2010 ACCF/AHA/AATS/ACR/ASA/SCA/SCAI/SIR/STS/SVM Guidelines for the   Diagnosis and Management of Patients with Thoracic Aortic Disease. Circulation.2010; 121: E266-e369. Aortic aneurysm NOS (ICD10-I71.9) 2. Stable peripheral pleural based subcentimeter pulmonary nodules, likely inflammatory. No follow-up necessary. 3. Aortic Atherosclerosis (ICD10-I70.0).  01/2020 CTA IMPRESSION: 1. Ascending aorta is at the upper limits of normal in size, stable. 2. Hepatic steatosis. Mild marginal irregularity the liver is indicative of cirrhosis. 3. Aortic atherosclerosis (ICD10-I70.0). Coronary artery calcification. 4.  Emphysema (ICD10-J43.9).    Assessment and Plan  1. Aortic valve replacement - hx of bicuspid AV, s/p bioprosthetic AVR in 2012. Mild gradient across the valve by echo 11/2016 with overall normal function - no symptoms, continue to monitor   2. Ascending aortic root aneurysm -has been stable with serial imaging, very mild enlargement - given stability and mild findings change interval to every 2 years for now.   3. HTN -he is at goal, continue current meds   4. Hyperlipidemia -LDL is at goal, continue statin. Discussed lifestyle modifications to increase HDL    EKG shows NSR today       F. , M.D 

## 2020-10-16 NOTE — Telephone Encounter (Signed)
Yes I sent in a prescription for this Vascepa so it is active again and yes please do the prior authorization form, he has been on it

## 2020-10-16 NOTE — Patient Instructions (Signed)

## 2020-10-16 NOTE — Progress Notes (Signed)
Reactivated sent new prescription for Vascepa

## 2020-10-17 NOTE — Telephone Encounter (Signed)
PA for Vascepa was restarted  Key: [id number]   Response: Available without authorization  [facility] yesterday

## 2020-10-29 ENCOUNTER — Encounter: Payer: Self-pay | Admitting: Family Medicine

## 2020-10-29 ENCOUNTER — Other Ambulatory Visit: Payer: Self-pay

## 2020-10-29 ENCOUNTER — Ambulatory Visit (INDEPENDENT_AMBULATORY_CARE_PROVIDER_SITE_OTHER): Payer: HMO | Admitting: Family Medicine

## 2020-10-29 VITALS — BP 145/65 | HR 71 | Ht 70.0 in | Wt 202.0 lb

## 2020-10-29 DIAGNOSIS — E785 Hyperlipidemia, unspecified: Secondary | ICD-10-CM | POA: Diagnosis not present

## 2020-10-29 DIAGNOSIS — E1169 Type 2 diabetes mellitus with other specified complication: Secondary | ICD-10-CM

## 2020-10-29 DIAGNOSIS — I10 Essential (primary) hypertension: Secondary | ICD-10-CM | POA: Diagnosis not present

## 2020-10-29 LAB — BAYER DCA HB A1C WAIVED: HB A1C (BAYER DCA - WAIVED): 8.1 % — ABNORMAL HIGH (ref <7.0)

## 2020-10-29 MED ORDER — RYBELSUS 14 MG PO TABS: 14.0000 mg | ORAL_TABLET | Freq: Every day | ORAL | 5 refills | Status: DC

## 2020-10-29 NOTE — Progress Notes (Signed)
BP (!) 145/65   Pulse 71   Ht 5\' 10"  (1.778 m)   Wt 202 lb (91.6 kg)   SpO2 100%   BMI 28.98 kg/m    Subjective:   Patient ID: [name] [name], male    DOB: 1956/02/19, 65 y.o.   MRN: [id number]  HPI: [name] [name] is a 65 y.o. male presenting on 10/29/2020 for Medical Management of Chronic Issues, Diabetes, Hyperlipidemia, and Ear Fullness (Has ENT)   HPI Patient has ear fullness like he had before and wants to have it checked.  He does have hearing aids is not hearing as well because the pressure and fullness and he wants to see if it is wax or something else.  He has had to see ENT for this previously  Type 2 diabetes mellitus Patient comes in today for recheck of his diabetes. Patient has been currently taking glipizide and metformin and Rybelsus, stopped the [place] because of cost.. Patient is currently on an ACE inhibitor/ARB. Patient has not seen an ophthalmologist this year. Patient denies any issues with their feet. The symptom started onset as an adult hypertension and hyperlipidemia ARE RELATED TO DM   Hyperlipidemia Patient is coming in for recheck of his hyperlipidemia. The patient is currently taking Vascepa and atorvastatin. They deny any issues with myalgias or history of liver damage from it. They deny any focal numbness or weakness or chest pain.   Hypertension Patient is currently on atenolol and lisinopril, and their blood pressure today is 145/65. Patient denies any lightheadedness or dizziness. Patient denies headaches, blurred vision, chest pains, shortness of breath, or weakness. Denies any side effects from medication and is content with current medication.   Relevant past medical, surgical, family and social history reviewed and updated as indicated. Interim medical history since our last visit reviewed. Allergies and medications reviewed and updated.  Review of Systems  Constitutional: Negative for chills and fever.  HENT: Positive for hearing loss.  Negative for ear discharge and ear pain.   Eyes: Negative for visual disturbance.  Respiratory: Negative for shortness of breath and wheezing.   Cardiovascular: Negative for chest pain and leg swelling.  Musculoskeletal: Negative for back pain and gait problem.  Skin: Negative for rash.  Neurological: Negative for dizziness, weakness and light-headedness.  All other systems reviewed and are negative.   Per HPI unless specifically indicated above   Allergies as of 10/29/2020      Reactions   Glyxambi [empagliflozin-linagliptin] Diarrhea, Itching   He was able to take Jardiance alone with no side effects so it was the Tradjenta part that most likely gave him the reaction   Hctz [hydrochlorothiazide]    Nausea and headache    Sulfa Antibiotics Nausea And Vomiting   Sulfonamide Derivatives       Medication List       Accurate as of October 29, 2020 10:02 AM. If you have any questions, ask your nurse or doctor.        aspirin EC 81 MG tablet Take 81 mg by mouth daily.   atenolol 25 MG tablet Commonly known as: TENORMIN Take 1 tablet (25 mg total) by mouth daily.   atorvastatin 40 MG tablet Commonly known as: LIPITOR Take 1 tablet (40 mg total) by mouth daily.   B-D UF III MINI PEN NEEDLES 31G X 5 MM Misc Generic drug: Insulin Pen Needle 1 each by Does not apply route daily.   blood glucose meter kit and supplies Kit Dispense based  on patient and insurance preference. Use up to four times daily as directed. (FOR ICD-9 250.00, 250.01).   cholecalciferol 1000 units tablet Commonly known as: VITAMIN D Take 2,000 Units by mouth daily.   dapagliflozin propanediol 10 MG Tabs tablet Commonly known as: Farxiga Take 1 tablet (10 mg total) by mouth daily before breakfast.   fexofenadine 180 MG tablet Commonly known as: ALLEGRA Take 180 mg by mouth daily.   fluticasone 50 MCG/ACT nasal spray Commonly known as: FLONASE Place 2 sprays into the nose daily as needed for  allergies.   GAS-X PO Take 1 capsule by mouth daily.   glipiZIDE 5 MG tablet Commonly known as: GLUCOTROL TAKE 2 TABLETS TWICE A DAY BEFORE MEALS   icosapent Ethyl 1 g capsule Commonly known as: Vascepa Take 2 capsules (2 g total) by mouth 2 (two) times daily.   lisinopril 20 MG tablet Commonly known as: ZESTRIL Take 1 tablet (20 mg total) by mouth daily.   metFORMIN 1000 MG tablet Commonly known as: GLUCOPHAGE Take 1 tablet (1,000 mg total) by mouth 2 (two) times daily with a meal.   metroNIDAZOLE 250 MG tablet Commonly known as: FLAGYL Take 1 tablet (250 mg total) by mouth daily as needed (abdominal pain).   OneTouch Delica Plus Lancet33G Misc 4 (four) times daily.   OneTouch Ultra test strip Generic drug: glucose blood 4 (four) times daily.   promethazine 25 MG tablet Commonly known as: PHENERGAN TAKE 1 TABLET EVERY 4 HOURS AS NEEDED   Rybelsus 14 MG Tabs Generic drug: Semaglutide Take 14 mg by mouth daily. What changed:   medication strength  how much to take Changed by: [name] Lula Michaux, MD   sodium fluoride 1.1 % Gel dental gel Commonly known as: FLUORISHIELD PreviDent 5000 Dry Mouth 1.1 % dental paste   temazepam 15 MG capsule Commonly known as: RESTORIL TAKE ONE CAPSULE BY MOUTH AT BEDTIME AS NEEDED   Toujeo SoloStar 300 UNIT/ML Solostar Pen Generic drug: insulin glargine (1 Unit Dial) Inject 10-20 Units into the skin daily.        Objective:   BP (!) 145/65   Pulse 71   Ht 5\' 10"  (1.778 m)   Wt 202 lb (91.6 kg)   SpO2 100%   BMI 28.98 kg/m   Wt Readings from Last 3 Encounters:  10/29/20 202 lb (91.6 kg)  10/16/20 204 lb 9.6 oz (92.8 kg)  09/26/20 203 lb 11.2 oz (92.4 kg)    Physical Exam Vitals and nursing note reviewed.  Constitutional:      General: He is not in acute distress.    Appearance: He is well-developed. He is not diaphoretic.  HENT:     Right Ear: Tympanic membrane, ear canal and external ear normal. There is no  impacted cerumen.     Left Ear: Tympanic membrane, ear canal and external ear normal. There is no impacted cerumen.  Eyes:     General: No scleral icterus.    Conjunctiva/sclera: Conjunctivae normal.  Neck:     Thyroid: No thyromegaly.  Cardiovascular:     Rate and Rhythm: Normal rate and regular rhythm.     Heart sounds: Normal heart sounds. No murmur heard.   Pulmonary:     Effort: Pulmonary effort is normal. No respiratory distress.     Breath sounds: Normal breath sounds. No wheezing.  Musculoskeletal:        General: Normal range of motion.     Cervical back: Neck supple.  Lymphadenopathy:  Cervical: No cervical adenopathy.  Skin:    General: Skin is warm and dry.     Findings: No rash.  Neurological:     Mental Status: He is alert and oriented to person, place, and time.     Coordination: Coordination normal.  Psychiatric:        Behavior: Behavior normal.       Assessment & Plan:   Problem List Items Addressed This Visit      Cardiovascular and Mediastinum   Essential hypertension     Endocrine   Type 2 diabetes mellitus with hyperlipidemia (HCC) - Primary   Relevant Medications   Semaglutide (RYBELSUS) 14 MG TABS   Other Relevant Orders   Bayer DCA Hb A1c Waived (Completed)     Other   Hyperlipidemia with target LDL less than 70      A1c is 8.1, will try and see if we can get him the [place] again, gave him the number for prescription assistance to get the cost down.  He will call up to ENT to help with his ears. Follow up plan: Return in about 3 months (around 01/28/2021), or if symptoms worsen or fail to improve, for Diabetes and hypertension and cholesterol.  Counseling provided for all of the vaccine components Orders Placed This Encounter  Procedures  . [facility] Hb A1c Waived    Arville Care, MD [facility] 10/29/2020, 10:02 AM

## 2020-10-31 DIAGNOSIS — H6122 Impacted cerumen, left ear: Secondary | ICD-10-CM | POA: Diagnosis not present

## 2020-10-31 DIAGNOSIS — Z974 Presence of external hearing-aid: Secondary | ICD-10-CM | POA: Diagnosis not present

## 2020-10-31 DIAGNOSIS — H903 Sensorineural hearing loss, bilateral: Secondary | ICD-10-CM | POA: Diagnosis not present

## 2020-11-18 ENCOUNTER — Other Ambulatory Visit: Payer: Self-pay | Admitting: *Deleted

## 2020-11-18 MED ORDER — ONETOUCH ULTRA VI STRP: ORAL_STRIP | 3 refills | Status: DC

## 2020-11-29 ENCOUNTER — Other Ambulatory Visit: Payer: Self-pay

## 2020-11-29 ENCOUNTER — Encounter: Payer: Self-pay | Admitting: Family Medicine

## 2020-11-29 ENCOUNTER — Ambulatory Visit (INDEPENDENT_AMBULATORY_CARE_PROVIDER_SITE_OTHER): Payer: HMO | Admitting: Family Medicine

## 2020-11-29 VITALS — BP 133/65 | HR 66 | Temp 97.2°F | Resp 20 | Ht 70.0 in | Wt 204.0 lb

## 2020-11-29 DIAGNOSIS — Z0000 Encounter for general adult medical examination without abnormal findings: Secondary | ICD-10-CM

## 2020-11-29 NOTE — Progress Notes (Signed)
Subjective:   [name] [name] is a 65 y.o. male who presents for a Welcome to Medicare exam.   Review of Systems: Review of Systems  Constitutional: Negative for chills and fever.  HENT: Negative for ear pain and tinnitus.   Eyes: Negative for blurred vision and pain.  Respiratory: Negative for cough, shortness of breath and wheezing.   Cardiovascular: Negative for chest pain, palpitations and leg swelling.  Gastrointestinal: Negative for abdominal pain, blood in stool, constipation, diarrhea and melena.  Genitourinary: Negative for dysuria and hematuria.  Musculoskeletal: Negative for back pain, joint pain and myalgias.  Skin: Negative for rash.  Neurological: Negative for dizziness, sensory change, focal weakness, weakness and headaches.  Psychiatric/Behavioral: Negative for depression and suicidal ideas.    Cardiac Risk Factors include: advanced age (>98mn, >>25women);diabetes mellitus;dyslipidemia;male gender;smoking/ tobacco exposure    Objective:    Today's Vitals   11/29/20 1427  BP: 133/65  Pulse: 66  Resp: 20  Temp: (!) 97.2 F (36.2 C)  TempSrc: Temporal  Weight: 204 lb (92.5 kg)  Height: _0  (1.778 m)   Body mass index is 29.27 kg/m.  Medications Outpatient Encounter Medications as of 11/29/2020  Medication Sig  . aspirin EC 81 MG tablet Take 81 mg by mouth daily.  .Marland Kitchenatenolol (TENORMIN) 25 MG tablet Take 1 tablet (25 mg total) by mouth daily.  .Marland Kitchenatorvastatin (LIPITOR) 40 MG tablet Take 1 tablet (40 mg total) by mouth daily.  . blood glucose meter kit and supplies KIT Dispense based on patient and insurance preference. Use up to four times daily as directed. (FOR ICD-9 250.00, 250.01).  . cholecalciferol (VITAMIN D) 1000 UNITS tablet Take 2,000 Units by mouth daily.  . dapagliflozin propanediol (FARXIGA) 10 MG TABS tablet Take 1 tablet (10 mg total) by mouth daily before breakfast.  . fexofenadine (ALLEGRA) 180 MG tablet Take 180 mg by mouth daily.   .  fluticasone (FLONASE) 50 MCG/ACT nasal spray Place 2 sprays into the nose daily as needed for allergies.   .Marland KitchenglipiZIDE (GLUCOTROL) 5 MG tablet TAKE 2 TABLETS TWICE A DAY BEFORE MEALS  . icosapent Ethyl (VASCEPA) 1 g capsule Take 2 capsules (2 g total) by mouth 2 (two) times daily.  . insulin glargine, 1 Unit Dial, (TOUJEO SOLOSTAR) 300 UNIT/ML Solostar Pen Inject 10-20 Units into the skin daily.  . Insulin Pen Needle (B-D UF III MINI PEN NEEDLES) 31G X 5 MM MISC 1 each by Does not apply route daily.  . Lancets (ONETOUCH DELICA PLUS [id number] MISC 4 (four) times daily.  .Marland Kitchenlisinopril (ZESTRIL) 20 MG tablet Take 1 tablet (20 mg total) by mouth daily.  . metFORMIN (GLUCOPHAGE) 1000 MG tablet Take 1 tablet (1,000 mg total) by mouth 2 (two) times daily with a meal.  . ONETOUCH ULTRA test strip Test BS 4 times daily Dx E11.69  . promethazine (PHENERGAN) 25 MG tablet TAKE 1 TABLET EVERY 4 HOURS AS NEEDED  . Semaglutide (RYBELSUS) 14 MG TABS Take 14 mg by mouth daily.  . Simethicone (GAS-X PO) Take 1 capsule by mouth daily.  . sodium fluoride (FLUORISHIELD) 1.1 % GEL dental gel PreviDent 5000 Dry Mouth 1.1 % dental paste  . temazepam (RESTORIL) 15 MG capsule TAKE ONE CAPSULE BY MOUTH AT BEDTIME AS NEEDED  . [DISCONTINUED] metroNIDAZOLE (FLAGYL) 250 MG tablet Take 1 tablet (250 mg total) by mouth daily as needed (abdominal pain).   No facility-administered encounter medications on file as of 11/29/2020.  History: Past Medical History:  Diagnosis Date  . Aortic stenosis 8/09   Bicuspid aortic valve  . Arthritis 6/10   hand   . Atrial fibrillation ([place])    post op only  . Bile duct calculus with acute cholecystitis   . CAD (coronary artery disease) 8/09  . Calculus of common bile duct with acute pancreatitis 11/2016  . CTS (carpal tunnel syndrome)    bilateral   . Diabetes ([place])   . Diastolic dysfunction 5/00  . Elevated LFTs   . Fatty liver disease, nonalcoholic   . History of kidney  stones   . Hyperlipidemia   . Hypertension   . Kidney stones   . Nasal congestion   . NIDDM (non-insulin dependent diabetes mellitus)    x15 yrs  . OSA on CPAP 2006  . Paresthesias in left hand 02/19/2004  . Paresthesias in right hand 02/19/2004  . Sinusitis   . Vitamin D deficiency 06/13/09   Past Surgical History:  Procedure Laterality Date  . bladder cancer - operation - 01/21/18 - Dr Tresa Krage  01/21/2018  . CARPAL TUNNEL RELEASE     bilateral   . CHOLECYSTECTOMY  11/2016  . COLONOSCOPY N/A 09/22/2018   Procedure: COLONOSCOPY;  Surgeon: Rogene Houston, MD;  [facility];  Service: Endoscopy;  Laterality: N/A;  1:00  . CORONARY ANGIOPLASTY WITH STENT PLACEMENT     to rt coronary atery (Dr. Degert-cardiologist)  . CYSTOSCOPY WITH RETROGRADE PYELOGRAM, URETEROSCOPY AND STENT PLACEMENT Left 03/07/2018   Procedure: CYSTOSCOPY WITH RETROGRADE PYELOGRAM,  AND STENT PLACEMENT;  Surgeon: [name] Frock, MD;  Location: [facility];  Service: Urology;  Laterality: Left;  . ERCP N/A 11/24/2016   Procedure: ENDOSCOPIC RETROGRADE CHOLANGIOPANCREATOGRAPHY (ERCP);  Surgeon: Rogene Houston, MD;  [facility];  Service: Endoscopy;  Laterality: N/A;  . EXTRACORPOREAL SHOCK WAVE LITHOTRIPSY Left 03/17/2018   Procedure: LEFT EXTRACORPOREAL SHOCK WAVE LITHOTRIPSY (ESWL);  Surgeon: Irine Seal, MD;  Location: [facility];  Service: Urology;  Laterality: Left;  . REMOVAL OF STONES N/A 11/24/2016   Procedure: REMOVAL OF STONES;  Surgeon: Rogene Houston, MD;  [facility];  Service: Endoscopy;  Laterality: N/A;  . SPHINCTEROTOMY N/A 11/24/2016   Procedure: SPHINCTEROTOMY;  Surgeon: Rogene Houston, MD;  [facility];  Service: Endoscopy;  Laterality: N/A;  . TISSUE AORTIC VALVE REPLACEMENT     2012    Family History  Problem Relation Age of Onset  . Hearing loss Mother   . Alzheimer's disease Mother   . Hearing loss Father   . Diabetes Father   . Diabetes Other         Family History    Social History   Occupational History  . Occupation: Retired  Tobacco Use  . Smoking status: Former Smoker    Packs/day: 1.50    Years: 25.00    Pack years: 37.50    Types: Cigarettes    Start date: 07/06/1974    Quit date: 07/06/2002    Years since quitting: 18.4  . Smokeless tobacco: Never Used  Vaping Use  . Vaping Use: Never used  Substance and Sexual Activity  . Alcohol use: No    Alcohol/week: 0.0 standard drinks  . Drug use: No  . Sexual activity: Not on file   Tobacco Counseling Counseling given: Not Answered   Immunizations and Health Maintenance Immunization History  Administered Date(s) Administered  . Influenza,inj,Quad PF,6+ Mos 05/17/2013, 05/03/2014, 04/25/2015, 04/10/2016, 04/23/2017, 05/05/2018, 04/20/2019, 04/26/2020  .  Pneumococcal Conjugate-13 11/24/2013  . Pneumococcal Polysaccharide-23 04/20/2019  . Td 11/11/2009  . Tdap 11/11/2009, 01/25/2020  . Zoster, Live 03/03/2011   There are no preventive care reminders to display for this patient.  Activities of Daily Living In your present state of health, do you have any difficulty performing the following activities: 11/29/2020  Hearing? Y  Comment Wears hearing aids  Vision? N  Difficulty concentrating or making decisions? N  Walking or climbing stairs? N  Dressing or bathing? N  Doing errands, shopping? N  Preparing Food and eating ? N  Using the Toilet? N  In the past six months, have you accidently leaked urine? N  Do you have problems with loss of bowel control? N  Managing your Medications? N  Managing your Finances? N  Housekeeping or managing your Housekeeping? N  Some recent data might be hidden    Physical Exam  (optional), or other factors deemed appropriate based on the beneficiary's medical and social history and current clinical standards.  Advanced Directives: Does Patient Have a Medical Advance Directive?: No Would patient like information on creating a  medical advance directive?: No - Patient declined    Assessment:    This is a routine wellness  examination for this patient .   Dietary issues and exercise activities discussed:  Current Exercise Habits: Home exercise routine, Type of exercise: walking;Other - see comments (Stationary bicycle), Time (Minutes): 30, Frequency (Times/Week): 5, Weekly Exercise (Minutes/Week): 150, Intensity: Moderate  Goals    . DIET - EAT MORE FRUITS AND VEGETABLES    . Exercise 150 min/wk Moderate Activity       Depression Screen PHQ 2/9 Scores 11/29/2020 07/29/2020 01/25/2020 10/26/2019  PHQ - 2 Score 0 0 0 0     Fall Risk Fall Risk  11/29/2020  Falls in the past year? 0  Number falls in past yr: -  Injury with Fall? -  Comment -  Risk for fall due to : -  Follow up -    Cognitive Function MMSE - Mini Mental State Exam 11/29/2020  Orientation to time 5  Orientation to Place 5  Registration 3  Attention/ Calculation 4  Recall 3  Language- name 2 objects 2  Language- repeat 1  Language- follow 3 step command 3  Language- read & follow direction 1  Write a sentence 1  Copy design 1  Total score 29        Patient Care Team: Margart Zemanek, Fransisca Kaufmann, MD as PCP - General (Family Medicine) Branch, Alphonse Guild, MD as PCP - Cardiology (Cardiology) Cathe Mons, MD as Consulting Physician (Internal Medicine) Lavera Guise, [facility] (Pharmacist)     Plan:     I have personally reviewed and noted the following in the patient's chart:   . Medical and social history . Use of alcohol, tobacco or illicit drugs  . Current medications and supplements . Functional ability and status . Nutritional status . Physical activity . Advanced directives . List of other physicians . Hospitalizations, surgeries, and ER visits in previous 12 months . Vitals . Screenings to include cognitive, depression, and falls . Referrals and appointments  In addition, I have reviewed and discussed with patient  certain preventive protocols, quality metrics, and best practice recommendations. A written personalized care plan for preventive services as well as general preventive health recommendations were provided to patient.    Worthy Rancher, MD 11/29/2020

## 2020-12-03 DIAGNOSIS — H90A31 Mixed conductive and sensorineural hearing loss, unilateral, right ear with restricted hearing on the contralateral side: Secondary | ICD-10-CM | POA: Diagnosis not present

## 2020-12-03 DIAGNOSIS — H90A22 Sensorineural hearing loss, unilateral, left ear, with restricted hearing on the contralateral side: Secondary | ICD-10-CM | POA: Diagnosis not present

## 2020-12-10 DIAGNOSIS — H90A22 Sensorineural hearing loss, unilateral, left ear, with restricted hearing on the contralateral side: Secondary | ICD-10-CM | POA: Diagnosis not present

## 2020-12-10 DIAGNOSIS — H6521 Chronic serous otitis media, right ear: Secondary | ICD-10-CM | POA: Insufficient documentation

## 2020-12-10 DIAGNOSIS — J3489 Other specified disorders of nose and nasal sinuses: Secondary | ICD-10-CM | POA: Diagnosis not present

## 2020-12-10 DIAGNOSIS — H90A31 Mixed conductive and sensorineural hearing loss, unilateral, right ear with restricted hearing on the contralateral side: Secondary | ICD-10-CM | POA: Diagnosis not present

## 2020-12-10 DIAGNOSIS — Z974 Presence of external hearing-aid: Secondary | ICD-10-CM | POA: Diagnosis not present

## 2020-12-23 ENCOUNTER — Telehealth: Payer: Self-pay | Admitting: Family Medicine

## 2020-12-23 DIAGNOSIS — E1169 Type 2 diabetes mellitus with other specified complication: Secondary | ICD-10-CM

## 2020-12-23 DIAGNOSIS — E785 Hyperlipidemia, unspecified: Secondary | ICD-10-CM

## 2020-12-24 NOTE — Telephone Encounter (Signed)
Since he is medicare   He would need to enroll in CCM and provider can put consult in for CCM pharmacy assistance (CCM is free for patient)   Obtaining free medications can take up to 6 weeks (not super quick)   He would have to bring in finances for he and spouse (if applicable)   Income limits $76,000 per year for household of 2 to receive free medications  After reviewing his med list we could help with insulin, rybelsus, farxiga  [facility] does not have patient assistance

## 2020-12-24 NOTE — Telephone Encounter (Signed)
Placed referral to CCM for pharmacy.

## 2020-12-26 ENCOUNTER — Encounter: Payer: Self-pay | Admitting: Family Medicine

## 2020-12-27 ENCOUNTER — Telehealth: Payer: Self-pay

## 2020-12-27 NOTE — Chronic Care Management (AMB) (Signed)
Chronic Care Management   Outreach Note  12/27/2020 Name: [name] [name] MRN: [id number] DOB: 01-Apr-1956  [name] [name] is a 65 y.o. year old male who is a primary care patient of Dettinger, [name], MD. I reached out to [name] [name] by phone today in response to a referral sent by Mr. [name] [name]'s Dettinger, [name], MD     An unsuccessful telephone outreach was attempted today. The patient was referred to the case management team for assistance with care management and care coordination.   Follow Up Plan: A HIPAA compliant phone message was left for the patient providing contact information and requesting a return call.  The care management team will reach out to the patient again over the next 7 days.  If patient returns call to provider office, please advise to call Embedded Care Management Care Guide Penne Lash at [phone]  Penne Lash, RMA Care Guide, Embedded Care Coordination [facility]  [place], [place] [address] Direct Dial: [phone] [email or web address] Website: [email or web address]

## 2020-12-30 NOTE — Chronic Care Management (AMB) (Signed)
Chronic Care Management   Note  12/30/2020 Name: DEMITRIS NIHILL MRN: [id number] DOB: Jan 15, 1956  JAVONNE BORKE is a 65 y.o. year old male who is a primary care patient of Dettinger, [name], MD. I reached out to [name] by phone today in response to a referral sent by Mr. Chandra Tromp Nidiffer's PCP, Dettinger, [name], MD      Mr. Warden was given information about Chronic Care Management services today including:  CCM service includes personalized support from designated clinical staff supervised by his physician, including individualized plan of care and coordination with other care providers 24/7 contact phone numbers for assistance for urgent and routine care needs. Service will only be billed when office clinical staff spend 20 minutes or more in a month to coordinate care. Only one practitioner may furnish and bill the service in a calendar month. The patient may stop CCM services at any time (effective at the end of the month) by phone call to the office staff. The patient will be responsible for cost sharing (co-pay) of up to 20% of the service fee (after annual deductible is met).  Patient agreed to services and verbal consent obtained.   Follow up plan: Telephone appointment with care management team member scheduled for:12/31/2020  Penne Lash, RMA Care Guide, Embedded Care Coordination [facility]  [place], [place] [address] Direct Dial: [phone] [email or web address]

## 2020-12-31 ENCOUNTER — Ambulatory Visit (INDEPENDENT_AMBULATORY_CARE_PROVIDER_SITE_OTHER): Payer: HMO | Admitting: Pharmacist

## 2020-12-31 DIAGNOSIS — E785 Hyperlipidemia, unspecified: Secondary | ICD-10-CM

## 2020-12-31 DIAGNOSIS — E1169 Type 2 diabetes mellitus with other specified complication: Secondary | ICD-10-CM

## 2020-12-31 NOTE — Progress Notes (Signed)
Chronic Care Management Pharmacy Note  12/31/2020 Name:  [name] [name] MRN:  [id number] DOB:  1956-05-28  Summary: diabetes management/med changes  Recommendations/Changes made from today's visit: Diabetes: Uncontrolled; current treatment:RYBELSUS 14MG , TOUJEO 18 UNITS Denies personal and family history of Medullary thyroid cancer (MTC) DISCONTINUED FARGIXA CONTINUE METFORMIN, GLIPIZIDE (PLANS TO D/C THIS MEDICATION) A1C 8.1% RYBELSUS WAS INCREASED TO 14MG  Current glucose readings: fasting glucose: <180, post prandial glucose: N/A Denies/reports hypoglycemic/hyperglycemic symptoms Discussed meal planning options and Plate method for healthy eating Avoid sugary drinks and desserts Incorporate balanced protein, non starchy veggies, 1 serving of carbohydrate with each meal Increase water intake Increase physical activity as able Current exercise: ENCOURAGED  Educated on [employer] /GLP1 Assessed patient finances. PATIENT IN THE COVERAGE GAP--RYBELSUS COSTING >$250/MONTH NOW.  WE WILL COMPLETE PATIENT ASSISTANCE VIA NOVO [place] PATIENT ASSISTANCE PROGRAM;  WE WILL CHANGE TOUJEO TO TRESIBA DUE TO PATIENT ASSISTANCE COMPANIES  Plan:  Subjective: [name] [name] is an 65 y.o. year old male who is a primary patient of Dettinger, Fransisca Kaufmann, MD.  The CCM team was consulted for assistance with disease management and care coordination needs.    Engaged with patient by telephone for initial visit in response to provider referral for pharmacy case management and/or care coordination services.   Consent to Services:  The patient was given the following information about Chronic Care Management services today, agreed to services, and gave verbal consent: 1. CCM service includes personalized support from designated clinical staff supervised by the primary care provider, including individualized plan of care and coordination with other care providers 2. 24/7 contact phone numbers for assistance for  urgent and routine care needs. 3. Service will only be billed when office clinical staff spend 20 minutes or more in a month to coordinate care. 4. Only one practitioner may furnish and bill the service in a calendar month. 5.The patient may stop CCM services at any time (effective at the end of the month) by phone call to the office staff. 6. The patient will be responsible for cost sharing (co-pay) of up to 20% of the service fee (after annual deductible is met). Patient agreed to services and consent obtained.  Patient Care Team: Dettinger, Fransisca Kaufmann, MD as PCP - General (Family Medicine) Arnoldo Lenis, MD as PCP - Cardiology (Cardiology) Cathe Mons, MD as Consulting Physician (Internal Medicine) Lavera Guise, [facility] (Pharmacist) Lavera Guise, [facility] as [place] Management (Pharmacist)  Objective:  Lab Results  Component Value Date   CREATININE 1.41 (H) 07/29/2020   CREATININE 1.39 (H) 04/26/2020   CREATININE 1.52 (H) 03/20/2020    Lab Results  Component Value Date   HGBA1C 8.1 (H) 10/29/2020   Last diabetic Eye exam:  Lab Results  Component Value Date/Time   HMDIABEYEEXA No Retinopathy 12/23/2016 12:00 AM    Last diabetic Foot exam: No results found for: HMDIABFOOTEX      Component Value Date/Time   CHOL 110 07/29/2020 1046   CHOL 76 12/05/2012 0931   TRIG 122 07/29/2020 1046   TRIG 180 (H) 01/19/2017 1131   TRIG 43 12/05/2012 0931   HDL 28 (L) 07/29/2020 1046   HDL 28 (L) 01/19/2017 1131   HDL 36 (L) 12/05/2012 0931   CHOLHDL 3.9 07/29/2020 1046   LDLCALC 60 07/29/2020 1046   LDLCALC 50 02/28/2014 1116   LDLCALC 31 12/05/2012 0931    Hepatic Function Latest Ref Rng & Units 07/29/2020 04/26/2020 03/20/2020  Total Protein 6.0 -  8.5 g/dL 7.5 7.2 7.3  Albumin 3.8 - 4.8 g/dL 4.7 4.6 -  AST 0 - 40 IU/L 38 27 24  ALT 0 - 44 IU/L 50(H) 37 39  Alk Phosphatase 44 - 121 IU/L 80 68 -  Total Bilirubin 0.0 - 1.2 mg/dL 0.4 0.4 0.5   Bilirubin, Direct 0.00 - 0.40 mg/dL - - -    Lab Results  Component Value Date/Time   TSH 1.740 03/26/2015 09:24 AM    CBC Latest Ref Rng & Units 07/29/2020 04/26/2020 01/25/2020  WBC 3.4 - 10.8 x10E3/uL 6.3 5.9 6.8  Hemoglobin 13.0 - 17.7 g/dL 12.7(L) 11.8(L) 12.5(L)  Hematocrit 37.5 - 51.0 % 37.7 35.6(L) 36.8(L)  Platelets 150 - 450 x10E3/uL 198 222 201    Lab Results  Component Value Date/Time   VD25OH 48.5 12/21/2018 08:18 AM   VD25OH 31.8 08/02/2018 04:50 PM    Clinical ASCVD: No  The ASCVD Risk score Mikey Bussing DC Jr., et al., 2013) failed to calculate for the following reasons:   The valid total cholesterol range is 130 to 320 mg/dL    Other: (CHADS2VASc if Afib, PHQ9 if depression, MMRC or CAT for COPD, ACT, DEXA)  Social History   Tobacco Use  Smoking Status Former   Packs/day: 1.50   Years: 25.00   Pack years: 37.50   Types: Cigarettes   Start date: 07/06/1974   Quit date: 07/06/2002   Years since quitting: 18.5  Smokeless Tobacco Never   BP Readings from Last 3 Encounters:  11/29/20 133/65  10/29/20 (!) 145/65  10/16/20 130/64   Pulse Readings from Last 3 Encounters:  11/29/20 66  10/29/20 71  10/16/20 68   Wt Readings from Last 3 Encounters:  11/29/20 204 lb (92.5 kg)  10/29/20 202 lb (91.6 kg)  10/16/20 204 lb 9.6 oz (92.8 kg)    Assessment: Review of patient past medical history, allergies, medications, health status, including review of consultants reports, laboratory and other test data, was performed as part of comprehensive evaluation and provision of chronic care management services.   SDOH:  (Social Determinants of Health) assessments and interventions performed:    CCM Care Plan  Allergies  Allergen Reactions   Glyxambi [Empagliflozin-Linagliptin] Diarrhea and Itching    He was able to take Jardiance alone with no side effects so it was the Tradjenta part that most likely gave him the reaction   Hctz [Hydrochlorothiazide]     Nausea and  headache    Sulfa Antibiotics Nausea And Vomiting   Sulfonamide Derivatives     Medications Reviewed Today     Reviewed by Lavera Guise, [facility] (Pharmacist) on 12/24/20 at 30  Med List Status: <None>   Medication Order Taking? Sig Documenting Provider Last Dose Status Informant  aspirin EC 81 MG tablet [id number] No Take 81 mg by mouth daily. [provider] Taking Active Self  atenolol (TENORMIN) 25 MG tablet [id number] No Take 1 tablet (25 mg total) by mouth daily. Dettinger, Fransisca Kaufmann, MD Taking Active   atorvastatin (LIPITOR) 40 MG tablet [id number] No Take 1 tablet (40 mg total) by mouth daily. Dettinger, Fransisca Kaufmann, MD Taking Active   blood glucose meter kit and supplies KIT [id number] No Dispense based on patient and insurance preference. Use up to four times daily as directed. (FOR ICD-9 250.00, 250.01). Dettinger, Fransisca Kaufmann, MD Taking Active   cholecalciferol (VITAMIN D) 1000 UNITS tablet [id number] No Take 2,000 Units by mouth daily. [provider] Taking Active Self  Med Note Joanne Gavel, CRYSTAL L   Wed Mar 20, 2020  1:54 PM) Per patient he takes BID  dapagliflozin propanediol (FARXIGA) 10 MG TABS tablet [id number] No Take 1 tablet (10 mg total) by mouth daily before breakfast. Dettinger, [name], MD Taking Active            Med Note Dareen Piano, Isabelle Course M   Wed Oct 16, 2020  9:21 AM) Cost $195-did not pick up from pharmacy  fexofenadine (ALLEGRA) 180 MG tablet [id number] No Take 180 mg by mouth daily.  [provider] Taking Active Self  fluticasone (FLONASE) 50 MCG/ACT nasal spray [id number] No Place 2 sprays into the nose daily as needed for allergies.  Ernestina Penna, MD Taking Active Self  glipiZIDE (GLUCOTROL) 5 MG tablet [id number] No TAKE 2 TABLETS TWICE A DAY BEFORE MEALS Dettinger, [name], MD Taking Active   icosapent Ethyl (VASCEPA) 1 g capsule [id number] No Take 2 capsules (2 g total) by mouth 2 (two) times daily. Dettinger, [name], MD Taking  Active   insulin glargine, 1 Unit Dial, (TOUJEO SOLOSTAR) 300 UNIT/ML Solostar Pen [id number] No Inject 10-20 Units into the skin daily. Dettinger, [name], MD Taking Active   Insulin Pen Needle (B-D UF III MINI PEN NEEDLES) 31G X 5 MM MISC [id number] No 1 each by Does not apply route daily. Dettinger, [name], MD Taking Active   Lancets [facility] DELICA PLUS [place]) MISC [id number] No 4 (four) times daily. [provider] Taking Active   lisinopril (ZESTRIL) 20 MG tablet [id number] No Take 1 tablet (20 mg total) by mouth daily. Dettinger, [name], MD Taking Active   metFORMIN (GLUCOPHAGE) 1000 MG tablet [id number] No Take 1 tablet (1,000 mg total) by mouth 2 (two) times daily with a meal. Dettinger, [name], MD Taking Active   [facility] ULTRA test strip [id number] No Test BS 4 times daily Dx E11.69 Dettinger, [name], MD Taking Active   promethazine (PHENERGAN) 25 MG tablet [id number] No TAKE 1 TABLET EVERY 4 HOURS AS NEEDED Dettinger, [name], MD Taking Active   Semaglutide (RYBELSUS) 14 MG TABS [id number] No Take 14 mg by mouth daily. Dettinger, [name], MD Taking Active   Simethicone (GAS-X PO) [id number] No Take 1 capsule by mouth daily. [provider] Taking Active Self           Med Note Joanne Gavel, CRYSTAL L   Wed Mar 20, 2020  1:55 PM) Per patient as needed.  sodium fluoride (FLUORISHIELD) 1.1 % GEL dental gel [id number] No PreviDent 5000 Dry Mouth 1.1 % dental paste [provider] Taking Active   temazepam (RESTORIL) 15 MG capsule [id number] No TAKE ONE CAPSULE BY MOUTH AT BEDTIME AS NEEDED Dettinger, [name], MD Taking Active             Patient Active Problem List   Diagnosis Date Noted   Pain in joint of right knee 07/15/2020   Sensorineural hearing loss (SNHL), bilateral 02/08/2020   Tinnitus of both ears 02/08/2020   IBS (irritable bowel syndrome) 03/09/2019   Acquired trigger finger of left middle finger 01/19/2018   Acquired trigger finger of left  ring finger 01/19/2018   Fracture of base of fifth metacarpal bone 10/28/2017   Common bile duct stone 11/24/2016   Pulmonary nodules 11/18/2016   Type 2 diabetes mellitus with hyperlipidemia (HCC) 12/04/2015   GERD (gastroesophageal reflux disease) 03/08/2014   NAFLD (nonalcoholic fatty liver disease) 16/07/241   Ascending aortic aneurysm (HCC) 12/12/2012  INSOMNIA 09/15/2010   Atrial fibrillation, chronic ([place]) 09/15/2010   CAD (coronary artery disease) 07/09/2009   AORTIC VALVE DISORDERS 05/27/2009   Hyperlipidemia with target LDL less than 70 05/24/2009   Essential hypertension 05/24/2009   UNSPECIFIED SLEEP APNEA 05/24/2009    Immunization History  Administered Date(s) Administered   Influenza,inj,Quad PF,6+ Mos 05/17/2013, 05/03/2014, 04/25/2015, 04/10/2016, 04/23/2017, 05/05/2018, 04/20/2019, 04/26/2020   Pneumococcal Conjugate-13 11/24/2013   Pneumococcal Polysaccharide-23 04/20/2019   Td 11/11/2009   Tdap 11/11/2009, 01/25/2020   Zoster, Live 03/03/2011    Conditions to be addressed/monitored: HTN, HLD, and DMII  Care Plan : PHARMD MEDICATION MANAGEMENT  Updates made by Lavera Guise, [place] since 12/31/2020 12:00 AM     Problem: DISEASE PROGRESSION PREVENTION      Long-Range Goal: T2DM   This Visit's Progress: On track  Priority: High  Note:   Current Barriers:  Unable to independently afford treatment regimen Unable to achieve control of T2DM   Pharmacist Clinical Goal(s):  Over the next 90 days, patient will verbalize ability to afford treatment regimen achieve control of T2DM as evidenced by GOAL A1C<7%  through collaboration with PharmD and provider.   Interventions: 1:1 collaboration with Dettinger, Fransisca Kaufmann, MD regarding development and update of comprehensive plan of care as evidenced by provider attestation and co-signature Inter-disciplinary care team collaboration (see longitudinal plan of care) Comprehensive medication review performed;  medication list updated in electronic medical record  Diabetes: Uncontrolled/controlled; current treatment:RYBELSUS 14MG , TOUJEO 18 UNITS Denies personal and family history of Medullary thyroid cancer (MTC) DISCONTINUED FARGIXA CONTINUE METFORMIN, GLIPIZIDE (PLANS TO D/C THIS MEDICATION) A1C 8.1% RYBELSUS WAS INCREASED TO 14MG  Current glucose readings: fasting glucose: <180, post prandial glucose: N/A Denies/reports hypoglycemic/hyperglycemic symptoms Discussed meal planning options and Plate method for healthy eating Avoid sugary drinks and desserts Incorporate balanced protein, non starchy veggies, 1 serving of carbohydrate with each meal Increase water intake Increase physical activity as able Current exercise: ENCOURAGED  Educated on [employer] /GLP1 Assessed patient finances. PATIENT IN THE COVERAGE GAP--RYBELSUS COSTING >$250/MONTH NOW.  WE WILL COMPLETE PATIENT ASSISTANCE VIA NOVO [place] PATIENT ASSISTANCE PROGRAM;  WE WILL CHANGE TOUJEO TO TRESIBA DUE TO PATIENT ASSISTANCE COMPANIES   Patient Goals/Self-Care Activities Over the next 90 days, patient will:  - take medications as prescribed check glucose DAILY (FASTING), document, and provide at future appointments  Follow Up Plan: Telephone follow up appointment with care management team member scheduled for: 1 MONTH      Medication Assistance: Application for rybelsus/tresiba-novo nordisk   medication assistance program. in process.  Anticipated assistance start date TBD.  See plan of care for additional detail.  Patient's preferred pharmacy is:  [facility], [place] [address] [place] [address] Phone: [phone] Fax: [phone]  [facility] [place], [place] [address] [place] [address] Phone: [phone] Fax: [phone]  [facility] [place], [place] [address] [place] [address] Phone: [phone] Fax: [phone]  Uses pill box? No -   Pt endorses 100 % compliance  Follow Up:  Patient agrees to Care Plan and Follow-up.  Plan: Telephone follow up appointment with care management team member scheduled for:  1 month  Regina Eck, PharmD, BCPS Clinical Pharmacist, [place]  II Phone [phone]

## 2020-12-31 NOTE — Patient Instructions (Signed)
Visit Information  PATIENT GOALS:  Goals Addressed               This Visit's Progress     Patient Stated     T2DM-PHARMD (pt-stated)        Current Barriers:  Unable to independently afford treatment regimen Unable to achieve control of T2DM    Pharmacist Clinical Goal(s):  Over the next 90 days, patient will verbalize ability to afford treatment regimen achieve control of T2DM as evidenced by GOAL A1C<7% through collaboration with PharmD and provider.   Interventions: 1:1 collaboration with Dettinger, [name], MD regarding development and update of comprehensive plan of care as evidenced by provider attestation and co-signature Inter-disciplinary care team collaboration (see longitudinal plan of care) Comprehensive medication review performed; medication list updated in electronic medical record  Diabetes: Uncontrolled/controlled; current treatment:RYBELSUS 14MG , TOUJEO 18 UNITS Denies personal and family history of Medullary thyroid cancer (MTC) DISCONTINUED FARGIXA CONTINUE METFORMIN, GLIPIZIDE (PLANS TO D/C THIS MEDICATION) A1C 8.1% RYBELSUS WAS INCREASED TO 14MG  Current glucose readings: fasting glucose: <180, post prandial glucose: N/A Denies/reports hypoglycemic/hyperglycemic symptoms Discussed meal planning options and Plate method for healthy eating Avoid sugary drinks and desserts Incorporate balanced protein, non starchy veggies, 1 serving of carbohydrate with each meal Increase water intake Increase physical activity as able Current exercise: ENCOURAGED  Educated on /GLP1 Assessed patient finances. PATIENT IN THE COVERAGE GAP--RYBELSUS COSTING >$250/MONTH NOW.  WE WILL COMPLETE PATIENT ASSISTANCE VIA NOVO [place] PATIENT ASSISTANCE PROGRAM;  WE WILL CHANGE TOUJEO TO TRESIBA DUE TO PATIENT ASSISTANCE COMPANIES  Patient Goals/Self-Care Activities Over the next 90 days, patient will:  - take medications as prescribed check glucose DAILY (FASTING),  document, and provide at future appointments  Follow Up Plan: Telephone follow up appointment with care management team member scheduled for: 1 MONTH          The patient verbalized understanding of instructions, educational materials, and care plan provided today and declined offer to receive copy of patient instructions, educational materials, and care plan.   Telephone follow up appointment with care management team member scheduled for: 1 month  , PharmD, BCPS Clinical Pharmacist, [facility]  II Phone [phone]

## 2021-01-16 DIAGNOSIS — H6521 Chronic serous otitis media, right ear: Secondary | ICD-10-CM | POA: Diagnosis not present

## 2021-01-25 ENCOUNTER — Other Ambulatory Visit: Payer: Self-pay | Admitting: Family Medicine

## 2021-01-25 DIAGNOSIS — E1169 Type 2 diabetes mellitus with other specified complication: Secondary | ICD-10-CM

## 2021-01-30 ENCOUNTER — Encounter: Payer: Self-pay | Admitting: Family Medicine

## 2021-01-30 ENCOUNTER — Ambulatory Visit (INDEPENDENT_AMBULATORY_CARE_PROVIDER_SITE_OTHER): Payer: HMO | Admitting: Pharmacist

## 2021-01-30 DIAGNOSIS — E785 Hyperlipidemia, unspecified: Secondary | ICD-10-CM

## 2021-01-30 DIAGNOSIS — E1169 Type 2 diabetes mellitus with other specified complication: Secondary | ICD-10-CM | POA: Diagnosis not present

## 2021-01-30 NOTE — Progress Notes (Signed)
Chronic Care Management Pharmacy Note  01/30/2021 Name:  [name] [name] MRN:  [id number] DOB:  1956-04-09  Summary: DIABETES MANAGEMENT  Recommendations/Changes made from today's visit: Diabetes: Uncontrolled; current treatment:RYBELSUS 14MG (patient has been out of this medication for ~1 month), TOUJEO 18 UNITS Denies personal and family history of Medullary thyroid cancer (MTC) DISCONTINUED FARGIXA CONTINUE METFORMIN, GLIPIZIDE (PLANS TO D/C THIS MEDICATION) B5D 9.7%  Application for novo nordisk sent again to company (they didn't receive), samples left of rybelsus 81m, then 735mto restart; monitor Bgs as we may be able to reduce insulin Current glucose readings: fasting glucose: <150, post prandial glucose: N/A Denies hypoglycemic/hyperglycemic symptoms Current exercise: ENCOURAGED  Educated on RYBELSUS /GLP1 Assessed patient finances. PATIENT IN THE COVERAGE GAP--RYBELSUS COSTING >$250/MONTH NOW.  RE-SENT PATIENT ASSISTANCE APPLICATION VIA NOVO [place]ATIENT ASSISTANCE PROGRAM (FOR RYBELSUS & TRESIBA);  WE WILL CHANGE TOUJEO TO TRESIBA DUE TO PATIENT ASSISTANCE COMPANIES  Follow Up Plan: Telephone follow up appointment with care management team member scheduled for: 1 MONTH  Subjective: [name] ESHBACHs an [age].o. year old male who is a primary patient of Dettinger, JoFransisca KaufmannMD.  The CCM team was consulted for assistance with disease management and care coordination needs.    Engaged with patient by telephone for follow up visit in response to provider referral for pharmacy case management and/or care coordination services.   Consent to Services:  The patient was given information about Chronic Care Management services, agreed to services, and gave verbal consent prior to initiation of services.  Please see initial visit note for detailed documentation.   Patient Care Team: Dettinger, JoFransisca KaufmannMD as PCP - General (Family Medicine) BrArnoldo LenisMD as PCP -  Cardiology (Cardiology) BeCathe MonsMD as Consulting Physician (Internal Medicine) PrLavera GuiseRPCalifornia Pacific [facility] Ctr-Pacific CampusPharmacist)   Objective:  Lab Results  Component Value Date   CREATININE 1.41 (H) 07/29/2020   CREATININE 1.39 (H) 04/26/2020   CREATININE 1.52 (H) 03/20/2020    Lab Results  Component Value Date   HGBA1C 8.1 (H) 10/29/2020   Last diabetic Eye exam:  Lab Results  Component Value Date/Time   HMDIABEYEEXA No Retinopathy 12/23/2016 12:00 AM    Last diabetic Foot exam: No results found for: HMDIABFOOTEX      Component Value Date/Time   CHOL 110 07/29/2020 1046   CHOL 76 12/05/2012 0931   TRIG 122 07/29/2020 1046   TRIG 180 (H) 01/19/2017 1131   TRIG 43 12/05/2012 0931   HDL 28 (L) 07/29/2020 1046   HDL 28 (L) 01/19/2017 1131   HDL 36 (L) 12/05/2012 0931   CHOLHDL 3.9 07/29/2020 1046   LDLCALC 60 07/29/2020 1046   LDLCALC 50 02/28/2014 1116   LDLCALC 31 12/05/2012 0931    Hepatic Function Latest Ref Rng & Units 07/29/2020 04/26/2020 03/20/2020  Total Protein 6.0 - 8.5 g/dL 7.5 7.2 7.3  Albumin 3.8 - 4.8 g/dL 4.7 4.6 -  AST 0 - 40 IU/L 38 27 24  ALT 0 - 44 IU/L 50(H) 37 39  Alk Phosphatase 44 - 121 IU/L 80 68 -  Total Bilirubin 0.0 - 1.2 mg/dL 0.4 0.4 0.5  Bilirubin, Direct 0.00 - 0.40 mg/dL - - -    Lab Results  Component Value Date/Time   TSH 1.740 03/26/2015 09:24 AM    CBC Latest Ref Rng & Units 07/29/2020 04/26/2020 01/25/2020  WBC 3.4 - 10.8 x10E3/uL 6.3 5.9 6.8  Hemoglobin 13.0 - 17.7 g/dL 12.7(L) 11.8(L)  12.5(L)  Hematocrit 37.5 - 51.0 % 37.7 35.6(L) 36.8(L)  Platelets 150 - 450 x10E3/uL 198 222 201    Lab Results  Component Value Date/Time   VD25OH 48.5 12/21/2018 08:18 AM   VD25OH 31.8 08/02/2018 04:50 PM    Clinical ASCVD: No  The ASCVD Risk score Mikey Bussing DC Jr., et al., 2013) failed to calculate for the following reasons:   The valid total cholesterol range is 130 to 320 mg/dL    Other: (CHADS2VASc if Afib, PHQ9 if depression,  MMRC or CAT for COPD, ACT, DEXA)  Social History   Tobacco Use  Smoking Status Former   Packs/day: 1.50   Years: 25.00   Pack years: 37.50   Types: Cigarettes   Start date: 07/06/1974   Quit date: 07/06/2002   Years since quitting: 18.5  Smokeless Tobacco Never   BP Readings from Last 3 Encounters:  11/29/20 133/65  10/29/20 (!) 145/65  10/16/20 130/64   Pulse Readings from Last 3 Encounters:  11/29/20 66  10/29/20 71  10/16/20 68   Wt Readings from Last 3 Encounters:  11/29/20 204 lb (92.5 kg)  10/29/20 202 lb (91.6 kg)  10/16/20 204 lb 9.6 oz (92.8 kg)    Assessment: Review of patient past medical history, allergies, medications, health status, including review of consultants reports, laboratory and other test data, was performed as part of comprehensive evaluation and provision of chronic care management services.   SDOH:  (Social Determinants of Health) assessments and interventions performed:    CCM Care Plan  Allergies  Allergen Reactions   Glyxambi [Empagliflozin-Linagliptin] Diarrhea and Itching    He was able to take Jardiance alone with no side effects so it was the Tradjenta part that most likely gave him the reaction   Hctz [Hydrochlorothiazide]     Nausea and headache    Sulfa Antibiotics Nausea And Vomiting   Sulfonamide Derivatives     Medications Reviewed Today     Reviewed by Lavera Guise, [facility] (Pharmacist) on 12/24/20 at 84  Med List Status: <None>   Medication Order Taking? Sig Documenting Provider Last Dose Status Informant  aspirin EC 81 MG tablet [id number] No Take 81 mg by mouth daily. [provider] Taking Active Self  atenolol (TENORMIN) 25 MG tablet [id number] No Take 1 tablet (25 mg total) by mouth daily. Dettinger, Fransisca Kaufmann, MD Taking Active   atorvastatin (LIPITOR) 40 MG tablet [id number] No Take 1 tablet (40 mg total) by mouth daily. Dettinger, Fransisca Kaufmann, MD Taking Active   blood glucose meter kit and supplies KIT [id number]  No Dispense based on patient and insurance preference. Use up to four times daily as directed. (FOR ICD-9 250.00, 250.01). Dettinger, Fransisca Kaufmann, MD Taking Active   cholecalciferol (VITAMIN D) 1000 UNITS tablet [id number] No Take 2,000 Units by mouth daily. [provider] Taking Active Self           Med Note Angela Adam, CRYSTAL L   Wed Mar 20, 2020  1:54 PM) Per patient he takes BID  dapagliflozin propanediol (FARXIGA) 10 MG TABS tablet [id number] No Take 1 tablet (10 mg total) by mouth daily before breakfast. Dettinger, Fransisca Kaufmann, MD Taking Active            Med Note Ouida Sills, Alma Friendly M   Wed Oct 16, 2020  9:21 AM) Cost $195-did not pick up from pharmacy  fexofenadine (ALLEGRA) 180 MG tablet [id number] No Take 180 mg by mouth daily.  [provider] Taking Active Self  fluticasone (FLONASE) 50 MCG/ACT nasal spray [id number] No Place 2 sprays into the nose daily as needed for allergies.  Chipper Herb, MD Taking Active Self  glipiZIDE (GLUCOTROL) 5 MG tablet [id number] No TAKE 2 TABLETS TWICE A DAY BEFORE MEALS Dettinger, Fransisca Kaufmann, MD Taking Active   icosapent Ethyl (VASCEPA) 1 g capsule [id number] No Take 2 capsules (2 g total) by mouth 2 (two) times daily. Dettinger, Fransisca Kaufmann, MD Taking Active   insulin glargine, 1 Unit Dial, (TOUJEO SOLOSTAR) 300 UNIT/ML Solostar Pen [id number] No Inject 10-20 Units into the skin daily. Dettinger, Fransisca Kaufmann, MD Taking Active   Insulin Pen Needle (B-D UF III MINI PEN NEEDLES) 31G X 5 MM MISC [id number] No 1 each by Does not apply route daily. Dettinger, Fransisca Kaufmann, MD Taking Active   Lancets (ONETOUCH DELICA PLUS VELFYB01B) [place] [id number] No 4 (four) times daily. [provider] Taking Active   lisinopril (ZESTRIL) 20 MG tablet [id number] No Take 1 tablet (20 mg total) by mouth daily. Dettinger, Fransisca Kaufmann, MD Taking Active   metFORMIN (GLUCOPHAGE) 1000 MG tablet [id number] No Take 1 tablet (1,000 mg total) by mouth 2 (two) times daily with a meal.  Dettinger, Fransisca Kaufmann, MD Taking Active   [facility] ULTRA test strip [id number] No Test BS 4 times daily Dx E11.69 Dettinger, Fransisca Kaufmann, MD Taking Active   promethazine (PHENERGAN) 25 MG tablet [id number] No TAKE 1 TABLET EVERY 4 HOURS AS NEEDED Dettinger, Fransisca Kaufmann, MD Taking Active   Semaglutide (RYBELSUS) 14 MG TABS [id number] No Take 14 mg by mouth daily. Dettinger, Fransisca Kaufmann, MD Taking Active   Simethicone (GAS-X PO) [id number] No Take 1 capsule by mouth daily. [provider] Taking Active Self           Med Note Angela Adam, CRYSTAL L   Wed Mar 20, 2020  1:55 PM) Per patient as needed.  sodium fluoride (FLUORISHIELD) 1.1 % GEL dental gel [id number] No PreviDent 5000 Dry Mouth 1.1 % dental paste [provider] Taking Active   temazepam (RESTORIL) 15 MG capsule [id number] No TAKE ONE CAPSULE BY MOUTH AT BEDTIME AS NEEDED Dettinger, Fransisca Kaufmann, MD Taking Active             Patient Active Problem List   Diagnosis Date Noted   Pain in joint of right knee 07/15/2020   Sensorineural hearing loss (SNHL), bilateral 02/08/2020   Tinnitus of both ears 02/08/2020   IBS (irritable bowel syndrome) 03/09/2019   Acquired trigger finger of left middle finger 01/19/2018   Acquired trigger finger of left ring finger 01/19/2018   Fracture of base of fifth metacarpal bone 10/28/2017   Common bile duct stone 11/24/2016   Pulmonary nodules 11/18/2016   Type 2 diabetes mellitus with hyperlipidemia ([place]) 12/04/2015   GERD (gastroesophageal reflux disease) 03/08/2014   NAFLD (nonalcoholic fatty liver disease) 03/08/2014   Ascending aortic aneurysm ([place]) 12/12/2012   INSOMNIA 09/15/2010   Atrial fibrillation, chronic ([place]) 09/15/2010   CAD (coronary artery disease) 07/09/2009   AORTIC VALVE DISORDERS 05/27/2009   Hyperlipidemia with target LDL less than 70 05/24/2009   Essential hypertension 05/24/2009   UNSPECIFIED SLEEP APNEA 05/24/2009    Immunization History  Administered Date(s)  Administered   Influenza,inj,Quad PF,6+ Mos 05/17/2013, 05/03/2014, 04/25/2015, 04/10/2016, 04/23/2017, 05/05/2018, 04/20/2019, 04/26/2020   Pneumococcal Conjugate-13 11/24/2013   Pneumococcal Polysaccharide-23 04/20/2019   Td 11/11/2009   Tdap 11/11/2009, 01/25/2020   Zoster, Live 03/03/2011    Conditions to be addressed/monitored: DMII  Care Plan :  PHARMD MEDICATION MANAGEMENT  Updates made by Lavera Guise, RPH since 01/30/2021 12:00 AM     Problem: DISEASE PROGRESSION PREVENTION      Long-Range Goal: T2DM   Recent Progress: On track  Priority: High  Note:   Current Barriers:  Unable to independently afford treatment regimen Unable to achieve control of T2DM   Pharmacist Clinical Goal(s):  Over the next 90 days, patient will verbalize ability to afford treatment regimen achieve control of T2DM as evidenced by GOAL A1C<7%  through collaboration with PharmD and provider.   Interventions: 1:1 collaboration with Dettinger, Fransisca Kaufmann, MD regarding development and update of comprehensive plan of care as evidenced by provider attestation and co-signature Inter-disciplinary care team collaboration (see longitudinal plan of care) Comprehensive medication review performed; medication list updated in electronic medical record  Diabetes: Uncontrolled; current treatment:RYBELSUS 14MG (patient has been out of this medication for ~1 month), TOUJEO 18 UNITS Denies personal and family history of Medullary thyroid cancer (MTC) DISCONTINUED FARGIXA CONTINUE METFORMIN, GLIPIZIDE (PLANS TO D/C THIS MEDICATION) G2R 4.2%  Application for novo nordisk sent again to company (they didn't receive), samples left of rybelsus 55m, then 754mto restart; monitor Bgs as we may be able to reduce insulin Current glucose readings: fasting glucose: <150, post prandial glucose: N/A Denies hypoglycemic/hyperglycemic symptoms Discussed meal planning options and Plate method for healthy eating Avoid sugary  drinks and desserts Incorporate balanced protein, non starchy veggies, 1 serving of carbohydrate with each meal Increase water intake Increase physical activity as able Current exercise: ENCOURAGED  Educated on RYBB&T CorporationGLP1 Assessed patient finances. PATIENT IN THE COVERAGE GAP--RYBELSUS COSTING >$250/MONTH NOW.  RE-SENT PATIENT ASSISTANCE APPLICATION VIA NOVO [place]IENT ASSISTANCE PROGRAM (FOR RYBELSUS & TRESIBA);  WE WILL CHANGE TOUJEO TO TRESIBA DUE TO PATIENT ASSISTANCE COMPANIES   Patient Goals/Self-Care Activities Over the next 90 days, patient will:  - take medications as prescribed check glucose DAILY (FASTING), document, and provide at future appointments  Follow Up Plan: Telephone follow up appointment with care management team member scheduled for: 1 MONTH      Medication Assistance: Application for NOVO [place]TRESIBA, RYBELSUS)  medication assistance program. in process.  Anticipated assistance start date TBD.  See plan of care for additional detail.  Patient[place]  [place] [place]hone: [phone]: [phone] for [place]one: [phone]: [phone] Med Ctr OshkoshEDennisMOLone [place]hone: [phone]: [phone] Up:  Patient agrees to Care Plan and Follow-up.  Plan: Telephone follow up appointment with care management team member scheduled for:  1 MONTH    JuRegina EckPharmD, BCPS Clinical Pharmacist, [place] Phone [phone]

## 2021-01-30 NOTE — Patient Instructions (Signed)
Visit Information  PATIENT GOALS:  Goals Addressed               This Visit's Progress     Patient Stated     T2DM-PHARMD (pt-stated)        Current Barriers:  Unable to independently afford treatment regimen Unable to achieve control of T2DM   Pharmacist Clinical Goal(s):  Over the next 90 days, patient will verbalize ability to afford treatment regimen achieve control of T2DM as evidenced by GOAL A1C<7% through collaboration with PharmD and provider.   Interventions: 1:1 collaboration with Dettinger, [name], MD regarding development and update of comprehensive plan of care as evidenced by provider attestation and co-signature Inter-disciplinary care team collaboration (see longitudinal plan of care) Comprehensive medication review performed; medication list updated in electronic medical record  Diabetes: Uncontrolled; current treatment:RYBELSUS 14MG  (patient has been out of this medication for ~1 month), TOUJEO 18 UNITS Denies personal and family history of Medullary thyroid cancer (MTC) DISCONTINUED FARGIXA CONTINUE METFORMIN, GLIPIZIDE (PLANS TO D/C THIS MEDICATION) A1C 8.1%  Application for novo nordisk sent again to company (they didn't receive), samples left of rybelsus 3mg , then 7mg  to restart; monitor Bgs as we may be able to reduce insulin Current glucose readings: fasting glucose: <150, post prandial glucose: N/A Denies hypoglycemic/hyperglycemic symptoms Discussed meal planning options and Plate method for healthy eating Avoid sugary drinks and desserts Incorporate balanced protein, non starchy veggies, 1 serving of carbohydrate with each meal Increase water intake Increase physical activity as able Current exercise: ENCOURAGED  Educated on RYBELSUS /GLP1 Assessed patient finances. PATIENT IN THE COVERAGE GAP--RYBELSUS COSTING >$250/MONTH NOW.  RE-SENT PATIENT ASSISTANCE APPLICATION VIA NOVO [place]ORDISK PATIENT ASSISTANCE PROGRAM (FOR RYBELSUS & TRESIBA);  WE WILL  CHANGE TOUJEO TO TRESIBA DUE TO PATIENT ASSISTANCE COMPANIES   Patient Goals/Self-Care Activities Over the next 90 days, patient will:  - take medications as prescribed check glucose DAILY (FASTING), document, and provide at future appointments  Follow Up Plan: Telephone follow up appointment with care management team member scheduled for: 1 MONTH         The patient verbalized understanding of instructions, educational materials, and care plan provided today and declined offer to receive copy of patient instructions, educational materials, and care plan.   Telephone follow up appointment with care management team member scheduled for: 1 MONTH  Signature , PharmD, BCPS Clinical Pharmacist, [facility]  II Phone [phone]

## 2021-02-02 ENCOUNTER — Encounter: Payer: Self-pay | Admitting: Family Medicine

## 2021-02-03 ENCOUNTER — Encounter: Payer: Self-pay | Admitting: Family Medicine

## 2021-02-03 ENCOUNTER — Other Ambulatory Visit: Payer: Self-pay

## 2021-02-03 ENCOUNTER — Ambulatory Visit (INDEPENDENT_AMBULATORY_CARE_PROVIDER_SITE_OTHER): Payer: HMO | Admitting: Family Medicine

## 2021-02-03 VITALS — BP 142/56 | HR 58 | Ht 70.0 in | Wt 200.4 lb

## 2021-02-03 DIAGNOSIS — E1169 Type 2 diabetes mellitus with other specified complication: Secondary | ICD-10-CM

## 2021-02-03 DIAGNOSIS — I10 Essential (primary) hypertension: Secondary | ICD-10-CM | POA: Diagnosis not present

## 2021-02-03 DIAGNOSIS — G4709 Other insomnia: Secondary | ICD-10-CM | POA: Diagnosis not present

## 2021-02-03 DIAGNOSIS — E785 Hyperlipidemia, unspecified: Secondary | ICD-10-CM | POA: Diagnosis not present

## 2021-02-03 LAB — BAYER DCA HB A1C WAIVED: HB A1C (BAYER DCA - WAIVED): 7.4 % — ABNORMAL HIGH (ref <7.0)

## 2021-02-03 MED ORDER — RYBELSUS 14 MG PO TABS: 14.0000 mg | ORAL_TABLET | Freq: Every day | ORAL | 5 refills | Status: DC

## 2021-02-03 MED ORDER — TEMAZEPAM 15 MG PO CAPS
ORAL_CAPSULE | ORAL | 1 refills | Status: DC
Start: 2021-02-03 — End: 2022-02-05

## 2021-02-03 NOTE — Progress Notes (Signed)
BP (!) 142/56   Pulse (!) 58   Ht $R'5\' 10"'Xz$  (1.778 m)   Wt 200 lb 6 oz (90.9 kg)   SpO2 100%   BMI 28.75 kg/m    Subjective:   Patient ID: [name] [name], male    DOB: [birth year], 65 y.o.   MRN: [id number]  HPI: LAURICE KIMMONS is a 65 y.o. male presenting on 02/03/2021 for Medical Management of Chronic Issues, Diabetes, Hyperlipidemia, and Hypertension   HPI Type 2 diabetes mellitus Patient comes in today for recheck of his diabetes. Patient has been currently taking glipizide and metformin and Toujeo and Rybelsus although he had been out of Rybelsus for the past 2 weeks, A1c 7.4. Patient is currently on an ACE inhibitor/ARB. Patient has not seen an ophthalmologist this year. Patient denies any issues with their feet. The symptom started onset as an adult hypertension and hyperlipidemia ARE RELATED TO DM   Hypertension Patient is currently on atenolol and lisinopril, and their blood pressure today is 142/56. Patient denies any lightheadedness or dizziness. Patient denies headaches, blurred vision, chest pains, shortness of breath, or weakness. Denies any side effects from medication and is content with current medication.   Hyperlipidemia Patient is coming in for recheck of his hyperlipidemia. The patient is currently taking Vascepa and Lipitor. They deny any issues with myalgias or history of liver damage from it. They deny any focal numbness or weakness or chest pain.   Patient is coming in today for recheck for temazepam, he says only used 2 since January and still has a bunch left and does not necessarily need a refill but is coming in to sign contract renewal.  Relevant past medical, surgical, family and social history reviewed and updated as indicated. Interim medical history since our last visit reviewed. Allergies and medications reviewed and updated.  Review of Systems  Constitutional:  Negative for chills and fever.  Eyes:  Negative for visual disturbance.  Respiratory:   Negative for shortness of breath and wheezing.   Cardiovascular:  Negative for chest pain and leg swelling.  Musculoskeletal:  Negative for back pain and gait problem.  Skin:  Negative for rash.  Neurological:  Negative for dizziness, weakness and light-headedness.  All other systems reviewed and are negative.  Per HPI unless specifically indicated above   Allergies as of 02/03/2021       Reactions   Glyxambi [empagliflozin-linagliptin] Diarrhea, Itching   He was able to take Jardiance alone with no side effects so it was the Tradjenta part that most likely gave him the reaction   Hctz [hydrochlorothiazide]    Nausea and headache    Sulfa Antibiotics Nausea And Vomiting   Sulfonamide Derivatives         Medication List        Accurate as of February 03, 2021 11:57 AM. If you have any questions, ask your nurse or doctor.          aspirin EC 81 MG tablet Take 81 mg by mouth daily.   atenolol 25 MG tablet Commonly known as: TENORMIN Take 1 tablet (25 mg total) by mouth daily.   atorvastatin 40 MG tablet Commonly known as: LIPITOR Take 1 tablet (40 mg total) by mouth daily.   blood glucose meter kit and supplies Kit Dispense based on patient and insurance preference. Use up to four times daily as directed. (FOR ICD-9 250.00, 250.01).   cholecalciferol 1000 units tablet Commonly known as: VITAMIN D Take 2,000 Units by mouth  daily.   fexofenadine 180 MG tablet Commonly known as: ALLEGRA Take 180 mg by mouth daily.   fluticasone 50 MCG/ACT nasal spray Commonly known as: FLONASE Place 2 sprays into the nose daily as needed for allergies.   GAS-X PO Take 1 capsule by mouth daily.   glipiZIDE 5 MG tablet Commonly known as: GLUCOTROL TAKE 2 TABLETS TWICE A DAY BEFORE MEALS   [employer] Inject Pen Needles 31G X 5 MM Misc Generic drug: Insulin Pen Needle Use daily as directed Dx E11.69   icosapent Ethyl 1 g capsule Commonly known as: Vascepa Take 2 capsules (2 g  total) by mouth 2 (two) times daily.   lisinopril 20 MG tablet Commonly known as: ZESTRIL Take 1 tablet (20 mg total) by mouth daily.   metFORMIN 1000 MG tablet Commonly known as: GLUCOPHAGE Take 1 tablet (1,000 mg total) by mouth 2 (two) times daily with a meal.   OneTouch Delica Plus [id number] Misc 4 (four) times daily.   OneTouch Ultra test strip Generic drug: glucose blood Test BS 4 times daily Dx E11.69   promethazine 25 MG tablet Commonly known as: PHENERGAN TAKE 1 TABLET EVERY 4 HOURS AS NEEDED   Rybelsus 14 MG Tabs Generic drug: Semaglutide Take 14 mg by mouth daily.   sodium fluoride 1.1 % Gel dental gel Commonly known as: FLUORISHIELD PreviDent 5000 Dry Mouth 1.1 % dental paste   temazepam 15 MG capsule Commonly known as: RESTORIL TAKE ONE CAPSULE BY MOUTH AT BEDTIME AS NEEDED   Toujeo SoloStar 300 UNIT/ML Solostar Pen Generic drug: insulin glargine (1 Unit Dial) Inject 10-20 Units into the skin daily.         Objective:   BP (!) 142/56   Pulse (!) 58   Ht $R'5\' 10"'Mv$  (1.778 m)   Wt 200 lb 6 oz (90.9 kg)   SpO2 100%   BMI 28.75 kg/m   Wt Readings from Last 3 Encounters:  02/03/21 200 lb 6 oz (90.9 kg)  11/29/20 204 lb (92.5 kg)  10/29/20 202 lb (91.6 kg)    Physical Exam Vitals and nursing note reviewed.  Constitutional:      General: He is not in acute distress.    Appearance: He is well-developed. He is not diaphoretic.  Eyes:     General: No scleral icterus.    Conjunctiva/sclera: Conjunctivae normal.  Neck:     Thyroid: No thyromegaly.  Cardiovascular:     Rate and Rhythm: Normal rate and regular rhythm.     Heart sounds: Normal heart sounds. No murmur heard. Pulmonary:     Effort: Pulmonary effort is normal. No respiratory distress.     Breath sounds: Normal breath sounds. No wheezing.  Musculoskeletal:        General: Normal range of motion.     Cervical back: Neck supple.  Lymphadenopathy:     Cervical: No cervical adenopathy.   Skin:    General: Skin is warm and dry.     Findings: No rash.  Neurological:     Mental Status: He is alert and oriented to person, place, and time.     Coordination: Coordination normal.  Psychiatric:        Behavior: Behavior normal.    Results for orders placed or performed in visit on 10/29/20  Bayer DCA Hb A1c Waived  Result Value Ref Range   HB A1C (BAYER DCA - WAIVED) 8.1 (H) <7.0 %    Assessment & Plan:   Problem List Items Addressed This  Visit       Cardiovascular and Mediastinum   Essential hypertension   Relevant Orders   CBC with Differential/Platelet   CMP14+EGFR   Lipid panel   Bayer DCA Hb A1c Waived     Endocrine   Type 2 diabetes mellitus with hyperlipidemia (HCC) - Primary   Relevant Medications   Semaglutide (RYBELSUS) 14 MG TABS   Other Relevant Orders   CBC with Differential/Platelet   CMP14+EGFR   Lipid panel   Bayer DCA Hb A1c Waived     Other   Hyperlipidemia with target LDL less than 70   Relevant Orders   CBC with Differential/Platelet   CMP14+EGFR   Lipid panel   Bayer DCA Hb A1c Waived   INSOMNIA   Relevant Medications   temazepam (RESTORIL) 15 MG capsule   Other Relevant Orders   ToxASSURE Select 13 (MW), Urine    Patient's A1c is improved 7.4, he is having trouble affording the Rybelsus and we are working on keeping it for him.  No other changes for now, blood pressure looks good. Follow up plan: Return in about 3 months (around 05/06/2021), or if symptoms worsen or fail to improve, for Diabetes and hypertension and cholesterol.  Counseling provided for all of the vaccine components Orders Placed This Encounter  Procedures   CBC with Differential/Platelet   CMP14+EGFR   Lipid panel   Bayer DCA Hb A1c Waived    Caryl Pina, MD [facility] 02/03/2021, 11:57 AM

## 2021-02-04 LAB — CMP14+EGFR
ALT: 27 IU/L (ref 0–44)
AST: 19 IU/L (ref 0–40)
Albumin/Globulin Ratio: 2 (ref 1.2–2.2)
Albumin: 4.8 g/dL (ref 3.8–4.8)
Alkaline Phosphatase: 70 IU/L (ref 44–121)
BUN/Creatinine Ratio: 20 (ref 10–24)
BUN: 29 mg/dL — ABNORMAL HIGH (ref 8–27)
Bilirubin Total: 0.5 mg/dL (ref 0.0–1.2)
CO2: 20 mmol/L (ref 20–29)
Calcium: 9.2 mg/dL (ref 8.6–10.2)
Chloride: 107 mmol/L — ABNORMAL HIGH (ref 96–106)
Creatinine, Ser: 1.47 mg/dL — ABNORMAL HIGH (ref 0.76–1.27)
Globulin, Total: 2.4 g/dL (ref 1.5–4.5)
Glucose: 122 mg/dL — ABNORMAL HIGH (ref 65–99)
Potassium: 5.5 mmol/L — ABNORMAL HIGH (ref 3.5–5.2)
Sodium: 143 mmol/L (ref 134–144)
Total Protein: 7.2 g/dL (ref 6.0–8.5)
eGFR: 53 mL/min/{1.73_m2} — ABNORMAL LOW (ref >59)

## 2021-02-04 LAB — CBC WITH DIFFERENTIAL/PLATELET
Basophils Absolute: 0 10*3/uL (ref 0.0–0.2)
Basos: 1 %
EOS (ABSOLUTE): 0.1 10*3/uL (ref 0.0–0.4)
Eos: 2 %
Hematocrit: 37.2 % — ABNORMAL LOW (ref 37.5–51.0)
Hemoglobin: 12.6 g/dL — ABNORMAL LOW (ref 13.0–17.7)
Immature Grans (Abs): 0 10*3/uL (ref 0.0–0.1)
Immature Granulocytes: 0 %
Lymphocytes Absolute: 1.5 10*3/uL (ref 0.7–3.1)
Lymphs: 25 %
MCH: 33.1 pg — ABNORMAL HIGH (ref 26.6–33.0)
MCHC: 33.9 g/dL (ref 31.5–35.7)
MCV: 98 fL — ABNORMAL HIGH (ref 79–97)
Monocytes Absolute: 0.5 10*3/uL (ref 0.1–0.9)
Monocytes: 8 %
Neutrophils Absolute: 4 10*3/uL (ref 1.4–7.0)
Neutrophils: 64 %
Platelets: 183 10*3/uL (ref 150–450)
RBC: 3.81 x10E6/uL — ABNORMAL LOW (ref 4.14–5.80)
RDW: 12.7 % (ref 11.6–15.4)
WBC: 6.2 10*3/uL (ref 3.4–10.8)

## 2021-02-04 LAB — LIPID PANEL
Chol/HDL Ratio: 3.5 ratio (ref 0.0–5.0)
Cholesterol, Total: 102 mg/dL (ref 100–199)
HDL: 29 mg/dL — ABNORMAL LOW (ref >39)
LDL Chol Calc (NIH): 51 mg/dL (ref 0–99)
Triglycerides: 119 mg/dL (ref 0–149)
VLDL Cholesterol Cal: 22 mg/dL (ref 5–40)

## 2021-02-05 LAB — TOXASSURE SELECT 13 (MW), URINE

## 2021-02-05 NOTE — Progress Notes (Signed)
Received notification from [employer] regarding approval for TRESIBA U100 & RYBELSUS 14MG . Patient assistance approved from 02/03/21 to 06/04/21.  Medication will ship to office in 10-14 business days  Phone: [phone]

## 2021-02-24 ENCOUNTER — Telehealth: Payer: Self-pay | Admitting: *Deleted

## 2021-02-24 NOTE — Telephone Encounter (Signed)
Pt assistance meds came in  [place] 100 - #2 pens. Novafine pen needles - #2 boxes Rybelsus bottle of 30- #4 boxes   LM for pt to know to come by for pick up  In fridge with name on it

## 2021-03-13 ENCOUNTER — Ambulatory Visit (INDEPENDENT_AMBULATORY_CARE_PROVIDER_SITE_OTHER): Payer: HMO | Admitting: Gastroenterology

## 2021-03-13 ENCOUNTER — Encounter (INDEPENDENT_AMBULATORY_CARE_PROVIDER_SITE_OTHER): Payer: Self-pay | Admitting: Gastroenterology

## 2021-03-13 ENCOUNTER — Other Ambulatory Visit: Payer: Self-pay

## 2021-03-13 VITALS — BP 145/65 | HR 62 | Temp 98.6°F | Ht 70.0 in | Wt 200.2 lb

## 2021-03-13 DIAGNOSIS — K589 Irritable bowel syndrome without diarrhea: Secondary | ICD-10-CM

## 2021-03-13 DIAGNOSIS — K760 Fatty (change of) liver, not elsewhere classified: Secondary | ICD-10-CM | POA: Diagnosis not present

## 2021-03-13 MED ORDER — METRONIDAZOLE 250 MG PO TABS: 250.0000 mg | ORAL_TABLET | Freq: Every day | ORAL | 3 refills | Status: DC

## 2021-03-13 NOTE — Progress Notes (Signed)
Referring Provider: Dettinger, Fransisca Kaufmann, MD Primary Care Physician:  Dettinger, Fransisca Kaufmann, MD Primary GI Physician: Rehman  Chief Complaint  Patient presents with   Follow-up    1 year follow up. Pt states he needs refills on flagyl.  Has low abdominal pain about 1 -2 times per month, diarrhea and constipation, gas, bloating, 2 -3 stools a day, not much of an appetite. Eats one meal a day   HPI:   [name] [name] is a 65 y.o. male with past medical history of aortic stenosis, arthritis, afib, cad, bile duct calculus with pancreatitis, DM, NAFLD, HLD, HTN, kidney stones, OSA on cpap, vitamin d deficiency.   Patient presenting today for follow up of IBS and NAFLD.  IBS: query SIBO in the past. States that he has issues 1-2x/month, reports that he will get bad gas, bloating, abdominal pain and a bad taste in his mouth, this has been ongoing/chronic for him. He will take a dose of flagyl which will resolve his symptoms. He reports typically having 2-3 BMs per day. He has constipation maybe 1-2x per month which is when he experiences symptoms, he takes a stool softener at that time which helps induce a BM. He reports that symptoms feel well managed with as needed flagyl. He does report a decreased appetite, but he is on rybelsus and notices that he will be hungry when he wakes up but after taking this he has a much lower appetite throughout the day.  NAFLD: concerns for fatty liver vs cirrhosis after abnormal CT in July 2021 showed hepatic steatosis with mild marginal irregularity of the liver indicating possible cirrhosis, however, [place] elastrography of liver in sept 2021 showed no hepatic masses, no CBD dilation, kPa: 6.2. not consistent with cirrhosis, should repeat elastography [place] again in 2024 per Dr. Olevia Perches recommendation.  Most recent LFTS 02/2021 WNL. INR was 1 on 03/20/20  Occasional reflux, takes tums as needed which helps.  No red flag symptoms. Patient denies melena, hematochezia,  nausea, vomiting, diarrhea, constipation, dysphagia, odyonophagia, early satiety or unintentional weight loss.   Family hx: no CRC or liver disease Social: denies alcohol or tobacco  Last Colonoscopy:09/22/18- Diverticulosis in the sigmoid colon and at the hepatic flexure.- External and internal hemorrhoids. No specimens Last Endoscopy:n/a  Recommendations:  Repeat colonoscopy in 2030  Past Medical History:  Diagnosis Date   Aortic stenosis 8/09   Bicuspid aortic valve   Arthritis 6/10   hand    Atrial fibrillation ([place])    post op only   Bile duct calculus with acute cholecystitis    CAD (coronary artery disease) 8/09   Calculus of common bile duct with acute pancreatitis 11/2016   CTS (carpal tunnel syndrome)    bilateral    Diabetes ([place])    Diastolic dysfunction 7/61   Elevated LFTs    Fatty liver disease, nonalcoholic    History of kidney stones    Hyperlipidemia    Hypertension    Kidney stones    Nasal congestion    NIDDM (non-insulin dependent diabetes mellitus)    x15 yrs   OSA on CPAP 2006   Paresthesias in left hand 02/19/2004   Paresthesias in right hand 02/19/2004   Sinusitis    Vitamin D deficiency 06/13/09    Past Surgical History:  Procedure Laterality Date   bladder cancer - operation - 01/21/18 - Dr Tresa Pavlovich  01/21/2018   CARPAL TUNNEL RELEASE     bilateral    CHOLECYSTECTOMY  11/2016  COLONOSCOPY N/A 09/22/2018   Procedure: COLONOSCOPY;  Surgeon: Rogene Houston, MD;  Location: [facility];  Service: Endoscopy;  Laterality: N/A;  1:00   CORONARY ANGIOPLASTY WITH STENT PLACEMENT     to rt coronary atery (Dr. Degert-cardiologist)   [place], URETEROSCOPY AND STENT PLACEMENT Left 03/07/2018   Procedure: CYSTOSCOPY WITH RETROGRADE PYELOGRAM,  AND STENT PLACEMENT;  Surgeon: [name] Frock, MD;  Location: [facility];  Service: Urology;  Laterality: Left;   ERCP N/A 11/24/2016   Procedure: ENDOSCOPIC RETROGRADE  CHOLANGIOPANCREATOGRAPHY (ERCP);  Surgeon: Rogene Houston, MD;  Location: [facility];  Service: Endoscopy;  Laterality: N/A;   EXTRACORPOREAL SHOCK WAVE LITHOTRIPSY Left 03/17/2018   Procedure: LEFT EXTRACORPOREAL SHOCK WAVE LITHOTRIPSY (ESWL);  Surgeon: Irine Seal, MD;  Location: [facility];  Service: Urology;  Laterality: Left;   REMOVAL OF STONES N/A 11/24/2016   Procedure: REMOVAL OF STONES;  Surgeon: Rogene Houston, MD;  [facility]: [facility];  Service: Endoscopy;  Laterality: N/A;   SPHINCTEROTOMY N/A 11/24/2016   Procedure: SPHINCTEROTOMY;  Surgeon: Rogene Houston, MD;  Location: [facility];  Service: Endoscopy;  Laterality: N/A;   TISSUE AORTIC VALVE REPLACEMENT     2012    Current Outpatient Medications  Medication Sig Dispense Refill   aspirin EC 81 MG tablet Take 81 mg by mouth daily.     atenolol (TENORMIN) 25 MG tablet Take 1 tablet (25 mg total) by mouth daily. 90 tablet 3   atorvastatin (LIPITOR) 40 MG tablet Take 1 tablet (40 mg total) by mouth daily. 90 tablet 3   blood glucose meter kit and supplies KIT Dispense based on patient and insurance preference. Use up to four times daily as directed. (FOR ICD-9 250.00, 250.01). 1 each 0   cholecalciferol (VITAMIN D) 1000 UNITS tablet Take 2,000 Units by mouth daily.     fexofenadine (ALLEGRA) 180 MG tablet Take 180 mg by mouth daily.      fluticasone (FLONASE) 50 MCG/ACT nasal spray Place 2 sprays into the nose daily as needed for allergies.      glipiZIDE (GLUCOTROL) 5 MG tablet TAKE 2 TABLETS TWICE A DAY BEFORE MEALS 360 tablet 3   insulin glargine, 1 Unit Dial, (TOUJEO SOLOSTAR) 300 UNIT/ML Solostar Pen Inject 10-20 Units into the skin daily. 6 mL 3   Insulin Pen Needle (GLOBAL EASE INJECT PEN NEEDLES) 31G X 5 MM MISC Use daily as directed Dx E11.69 100 each 3   Lancets (ONETOUCH DELICA PLUS VXBLTJ03E) MISC 4 (four) times daily.     lisinopril (ZESTRIL) 20 MG tablet Take 1 tablet (20 mg total) by mouth daily. 90 tablet 3    metFORMIN (GLUCOPHAGE) 1000 MG tablet Take 1 tablet (1,000 mg total) by mouth 2 (two) times daily with a meal. 180 tablet 3   metroNIDAZOLE (FLAGYL) 250 MG tablet Take 250 mg by mouth daily. As needed     ONETOUCH ULTRA test strip Test BS 4 times daily Dx E11.69 400 each 3   promethazine (PHENERGAN) 25 MG tablet TAKE 1 TABLET EVERY 4 HOURS AS NEEDED 30 tablet 3   Semaglutide (RYBELSUS) 14 MG TABS Take 14 mg by mouth daily. 30 tablet 5   Simethicone (GAS-X PO) Take 1 capsule by mouth daily.     sodium fluoride (FLUORISHIELD) 1.1 % GEL dental gel PreviDent 5000 Dry Mouth 1.1 % dental paste     temazepam (RESTORIL) 15 MG capsule TAKE ONE CAPSULE BY MOUTH AT BEDTIME AS NEEDED 30  capsule 1   No current facility-administered medications for this visit.    Allergies as of 03/13/2021 - Review Complete 03/13/2021  Allergen Reaction Noted   Glyxambi [empagliflozin-linagliptin] Diarrhea and Itching 02/26/2017   Hctz [hydrochlorothiazide]  11/25/2010   Sulfa antibiotics Nausea And Vomiting 11/25/2010   Sulfonamide derivatives  05/24/2009    Family History  Problem Relation Age of Onset   Hearing loss Mother    Alzheimer's disease Mother    Hearing loss Father    Diabetes Father    Diabetes Other        Family History     Social History   Socioeconomic History   Marital status: Married    Spouse name: Not on file   Number of children: 3   Years of education: Not on file   Highest education level: High school graduate  Occupational History   Occupation: Retired  Tobacco Use   Smoking status: Former    Packs/day: 1.50    Years: 25.00    Pack years: 37.50    Types: Cigarettes    Start date: 07/06/1974    Quit date: 07/06/2002    Years since quitting: 18.6   Smokeless tobacco: Never  Vaping Use   Vaping Use: Never used  Substance and Sexual Activity   Alcohol use: No    Alcohol/week: 0.0 standard drinks   Drug use: No   Sexual activity: Not on file  Other Topics Concern   Not  on file  Social History Narrative   Not on file   Social Determinants of Health   Financial Resource Strain: Not on file  Food Insecurity: Not on file  Transportation Needs: Not on file  Physical Activity: Not on file  Stress: Not on file  Social Connections: Not on file   Review of Systems: Gen: Denies fever, chills, anorexia. Denies fatigue, weakness, weight loss.  CV: Denies chest pain, palpitations, syncope, peripheral edema, and claudication. Resp: Denies dyspnea at rest, cough, wheezing, coughing up blood, and pleurisy. GI: Denies vomiting blood, jaundice, and fecal incontinence. Denies dysphagia or odynophagia. +occasional constipation, +occasional bloating +occasional abdominal pain Derm: Denies rash, itching, dry skin Psych: Denies depression, anxiety, memory loss, confusion. No homicidal or suicidal ideation.  Heme: Denies bruising, bleeding, and enlarged lymph nodes.  Physical Exam: BP (!) 145/65 (BP Location: Right Arm, Patient Position: Sitting, Cuff Size: Large)   Pulse 62   Temp 98.6 F (37 C) (Oral)   Ht _0  (1.778 m)   Wt 200 lb 3.2 oz (90.8 kg)   BMI 28.73 kg/m  General:   Alert and oriented. No distress noted. Pleasant and cooperative.  Head:  Normocephalic and atraumatic. Eyes:  Conjuctiva clear without scleral icterus. Mouth:  Oral mucosa pink and moist. Good dentition. No lesions. Heart: Normal rate and rhythm, s1 and s2 heart sounds present.  Lungs: Clear lung sounds in all lobes. Respirations equal and unlabored. Abdomen:  +BS, soft, non-tender and non-distended. No rebound or guarding. No HSM or masses noted. Derm: No palmar erythema or jaundice Msk:  Symmetrical without gross deformities. Normal posture. Extremities:  Without edema. Neurologic:  Alert and  oriented x4 Psych:  Alert and cooperative. Normal mood and affect.  Invalid input(s): 6 MONTHS   ASSESSMENT: [name] [name] is a 65 y.o. male presenting today for follow up of presumed IBS  and NAFLD.  Patient has had recurrent symptoms 1-2x/month, ongoing for the past few years where he develops constipation, lower abdominal pain, bloating, gas and a  bad taste in his mouth. Has taken flagyl 248m tablet whenever he feels these symptoms which resolves them. He also takes a stool softener for the constipation which helps. He denies any other associated symptoms. Having 2-3 BMs per day. Appetite decreased due to his Rybelsus, but denies weight loss.   History of known fatty liver, last liver elastography in sept 2021, not consistent with cirrhosis. Patient does not drink alcohol. Last LFTs in aug 2022 were all WNL. Dr. RLaural Goldenrecommended repeat elastography in sept 2024. We will continue to follow LFTs and clinical presentation.    PLAN:  Continue flagyl 2548mas needed 2. Continue stool softener as needed 3. Continue to avoid alcohol and eat a nutritious diet   Follow up 1 year  [name] Basham L. CaAlver SorrowMSN, APRN, AGNP-C Adult-Gerontology Nurse Practitioner [facility]

## 2021-03-13 NOTE — Patient Instructions (Signed)
You can continue to use Metronidazole 250mg  on as needed bases, refill was sent to your pharmacy.  Follow up in 1 year unless you need sooner

## 2021-03-27 ENCOUNTER — Ambulatory Visit (INDEPENDENT_AMBULATORY_CARE_PROVIDER_SITE_OTHER): Payer: BC Managed Care – PPO | Admitting: Gastroenterology

## 2021-04-01 DIAGNOSIS — M25551 Pain in right hip: Secondary | ICD-10-CM | POA: Diagnosis not present

## 2021-04-01 DIAGNOSIS — M25552 Pain in left hip: Secondary | ICD-10-CM | POA: Diagnosis not present

## 2021-04-13 DIAGNOSIS — M25552 Pain in left hip: Secondary | ICD-10-CM | POA: Diagnosis not present

## 2021-04-21 DIAGNOSIS — M25552 Pain in left hip: Secondary | ICD-10-CM | POA: Diagnosis not present

## 2021-04-21 DIAGNOSIS — M87859 Other osteonecrosis, unspecified femur: Secondary | ICD-10-CM | POA: Diagnosis not present

## 2021-04-29 DIAGNOSIS — M25552 Pain in left hip: Secondary | ICD-10-CM | POA: Insufficient documentation

## 2021-05-02 ENCOUNTER — Ambulatory Visit (INDEPENDENT_AMBULATORY_CARE_PROVIDER_SITE_OTHER): Payer: HMO | Admitting: Pharmacist

## 2021-05-02 DIAGNOSIS — E785 Hyperlipidemia, unspecified: Secondary | ICD-10-CM

## 2021-05-02 DIAGNOSIS — E1169 Type 2 diabetes mellitus with other specified complication: Secondary | ICD-10-CM

## 2021-05-02 NOTE — Progress Notes (Signed)
Chronic Care Management Pharmacy Note  05/02/2021 Name:  [name] [name] MRN:  [id number] DOB:  1955/08/21  Summary: T2DM  Recommendations/Changes made from today's visit: Diabetes: Uncontrolled; current treatment: RYBELSUS 14MG , TOUJEO 18 UNITS, glipizide Denies personal and family history of Medullary thyroid cancer (MTC) DISCONTINUED FARGIXA CONTINUE RYBELSUS, METFORMIN, GLIPIZIDE (PLANS TO D/C THIS MEDICATION) A1C 7.4% Patient assistance supply arrived today (rybelsus 14mg  #2 bottles, 60 tabs)--will apply for tresiba for next year Application for novo nordisk to be sent next week when patient brings in financials for enrollment year 2023 Current glucose readings: fasting glucose: <150, post prandial glucose: N/A Denies hypoglycemic/hyperglycemic symptoms Discussed meal planning options and Plate method for healthy eating Avoid sugary drinks and desserts Incorporate balanced protein, non starchy veggies, 1 serving of carbohydrate with each meal Increase water intake Increase physical activity as able Current exercise: ENCOURAGED  Educated on [employer] /GLP1 Assessed patient finances. RE-SENT PATIENT ASSISTANCE APPLICATION VIA NOVO NORDISK PATIENT ASSISTANCE PROGRAM (FOR RYBELSUS & TRESIBA);    Follow Up Plan: Telephone follow up appointment with care management team member scheduled for: 1 MONTH   Subjective: [name] [name] is an 65 y.o. year old male who is a primary patient of Dettinger, Fransisca Kaufmann, MD.  The CCM team was consulted for assistance with disease management and care coordination needs.    Engaged with patient by telephone for follow up visit in response to provider referral for pharmacy case management and/or care coordination services.   Consent to Services:  The patient was given information about Chronic Care Management services, agreed to services, and gave verbal consent prior to initiation of services.  Please see initial visit note for detailed  documentation.   Patient Care Team: Dettinger, Fransisca Kaufmann, MD as PCP - General (Family Medicine) Arnoldo Lenis, MD as PCP - Cardiology (Cardiology) Cathe Mons, MD as Consulting Physician (Internal Medicine) Lavera Guise, [facility] (Pharmacist)  Objective:  Lab Results  Component Value Date   CREATININE 1.47 (H) 02/03/2021   CREATININE 1.41 (H) 07/29/2020   CREATININE 1.39 (H) 04/26/2020    Lab Results  Component Value Date   HGBA1C 7.4 (H) 02/03/2021   Last diabetic Eye exam:  Lab Results  Component Value Date/Time   HMDIABEYEEXA No Retinopathy 12/23/2016 12:00 AM    Last diabetic Foot exam: No results found for: HMDIABFOOTEX      Component Value Date/Time   CHOL 102 02/03/2021 1138   CHOL 76 12/05/2012 0931   TRIG 119 02/03/2021 1138   TRIG 180 (H) 01/19/2017 1131   TRIG 43 12/05/2012 0931   HDL 29 (L) 02/03/2021 1138   HDL 28 (L) 01/19/2017 1131   HDL 36 (L) 12/05/2012 0931   CHOLHDL 3.5 02/03/2021 1138   LDLCALC 51 02/03/2021 1138   LDLCALC 50 02/28/2014 1116   LDLCALC 31 12/05/2012 0931    Hepatic Function Latest Ref Rng & Units 02/03/2021 07/29/2020 04/26/2020  Total Protein 6.0 - 8.5 g/dL 7.2 7.5 7.2  Albumin 3.8 - 4.8 g/dL 4.8 4.7 4.6  AST 0 - 40 IU/L 19 38 27  ALT 0 - 44 IU/L 27 50(H) 37  Alk Phosphatase 44 - 121 IU/L 70 80 68  Total Bilirubin 0.0 - 1.2 mg/dL 0.5 0.4 0.4  Bilirubin, Direct 0.00 - 0.40 mg/dL - - -    Lab Results  Component Value Date/Time   TSH 1.740 03/26/2015 09:24 AM    CBC Latest Ref Rng & Units 02/03/2021 07/29/2020 04/26/2020  WBC 3.4 -  10.8 x10E3/uL 6.2 6.3 5.9  Hemoglobin 13.0 - 17.7 g/dL 12.6(L) 12.7(L) 11.8(L)  Hematocrit 37.5 - 51.0 % 37.2(L) 37.7 35.6(L)  Platelets 150 - 450 x10E3/uL 183 198 222    Lab Results  Component Value Date/Time   VD25OH 48.5 12/21/2018 08:18 AM   VD25OH 31.8 08/02/2018 04:50 PM    Clinical ASCVD: No  The ASCVD Risk score (Arnett DK, et al., 2019) failed to calculate for the  following reasons:   The valid total cholesterol range is 130 to 320 mg/dL    Other: (CHADS2VASc if Afib, PHQ9 if depression, MMRC or CAT for COPD, ACT, DEXA)  Social History   Tobacco Use  Smoking Status Former   Packs/day: 1.50   Years: 25.00   Pack years: 37.50   Types: Cigarettes   Start date: 07/06/1974   Quit date: 07/06/2002   Years since quitting: 18.8  Smokeless Tobacco Never   BP Readings from Last 3 Encounters:  03/13/21 (!) 145/65  02/03/21 (!) 142/56  11/29/20 133/65   Pulse Readings from Last 3 Encounters:  03/13/21 62  02/03/21 (!) 58  11/29/20 66   Wt Readings from Last 3 Encounters:  03/13/21 200 lb 3.2 oz (90.8 kg)  02/03/21 200 lb 6 oz (90.9 kg)  11/29/20 204 lb (92.5 kg)    Assessment: Review of patient past medical history, allergies, medications, health status, including review of consultants reports, laboratory and other test data, was performed as part of comprehensive evaluation and provision of chronic care management services.   SDOH:  (Social Determinants of Health) assessments and interventions performed:    CCM Care Plan  Allergies  Allergen Reactions   Glyxambi [Empagliflozin-Linagliptin] Diarrhea and Itching    He was able to take Jardiance alone with no side effects so it was the Tradjenta part that most likely gave him the reaction   Hctz [Hydrochlorothiazide]     Nausea and headache    Sulfa Antibiotics Nausea And Vomiting   Sulfonamide Derivatives     Medications Reviewed Today     Reviewed by Lavera Guise, [facility] (Pharmacist) on 05/02/21 at 75  Med List Status: <None>   Medication Order Taking? Sig Documenting Provider Last Dose Status Informant  aspirin EC 81 MG tablet [id number] No Take 81 mg by mouth daily. [provider] Taking Active Self  atenolol (TENORMIN) 25 MG tablet [id number] No Take 1 tablet (25 mg total) by mouth daily. Dettinger, Fransisca Kaufmann, MD Taking Active   atorvastatin (LIPITOR) 40 MG tablet  [id number] No Take 1 tablet (40 mg total) by mouth daily. Dettinger, Fransisca Kaufmann, MD Taking Active   blood glucose meter kit and supplies KIT [id number] No Dispense based on patient and insurance preference. Use up to four times daily as directed. (FOR ICD-9 250.00, 250.01). Dettinger, Fransisca Kaufmann, MD Taking Active   cholecalciferol (VITAMIN D) 1000 UNITS tablet [id number] No Take 2,000 Units by mouth daily. [provider] Taking Active Self           Med Note Angela Adam, CRYSTAL L   Wed Mar 20, 2020  1:54 PM) Per patient he takes BID  fexofenadine (ALLEGRA) 180 MG tablet [id number] No Take 180 mg by mouth daily.  [provider] Taking Active Self           Med Note Celesta Aver, [facility] M   Thu Mar 13, 2021 11:45 AM) 1 -2 a week  fluticasone (FLONASE) 50 MCG/ACT nasal spray [id number] No Place 2 sprays into the nose daily as  needed for allergies.  Chipper Herb, MD Taking Active Self  glipiZIDE (GLUCOTROL) 5 MG tablet [id number] No TAKE 2 TABLETS TWICE A DAY BEFORE MEALS Dettinger, Fransisca Kaufmann, MD Taking Active   insulin glargine, 1 Unit Dial, (TOUJEO SOLOSTAR) 300 UNIT/ML Solostar Pen [id number] No Inject 10-20 Units into the skin daily. Dettinger, Fransisca Kaufmann, MD Taking Active   Insulin Pen Needle (GLOBAL EASE INJECT PEN NEEDLES) 31G X 5 MM MISC [id number] No Use daily as directed Dx E11.69 Dettinger, Fransisca Kaufmann, MD Taking Active   Lancets (ONETOUCH DELICA PLUS [id number]) [place] [id number] No 4 (four) times daily. [provider] Taking Active   lisinopril (ZESTRIL) 20 MG tablet [id number] No Take 1 tablet (20 mg total) by mouth daily. Dettinger, Fransisca Kaufmann, MD Taking Active   metFORMIN (GLUCOPHAGE) 1000 MG tablet [id number] No Take 1 tablet (1,000 mg total) by mouth 2 (two) times daily with a meal. Dettinger, Fransisca Kaufmann, MD Taking Active   metroNIDAZOLE (FLAGYL) 250 MG tablet [id number]  Take 1 tablet (250 mg total) by mouth daily. As needed Gabriel Rung, NP  Active   ONETOUCH ULTRA test strip  [id number] No Test BS 4 times daily Dx E11.69 Dettinger, Fransisca Kaufmann, MD Taking Active   promethazine (PHENERGAN) 25 MG tablet [id number] No TAKE 1 TABLET EVERY 4 HOURS AS NEEDED Dettinger, Fransisca Kaufmann, MD Taking Active   Semaglutide (RYBELSUS) 14 MG TABS [id number] No Take 14 mg by mouth daily. Dettinger, Fransisca Kaufmann, MD Taking Active            Med Note Blanca Friend, Royce Macadamia   Fri May 02, 2021 10:30 AM) VIA NOVO NORDISK PATIENT ASSISTANCE PROGRAM  Simethicone (GAS-X PO) [id number] No Take 1 capsule by mouth daily. [provider] Taking Active Self           Med Note Angela Adam, CRYSTAL L   Wed Mar 20, 2020  1:55 PM) Per patient as needed.  sodium fluoride (FLUORISHIELD) 1.1 % GEL dental gel [id number] No PreviDent 5000 Dry Mouth 1.1 % dental paste [provider] Taking Active   temazepam (RESTORIL) 15 MG capsule [id number] No TAKE ONE CAPSULE BY MOUTH AT BEDTIME AS NEEDED Dettinger, Fransisca Kaufmann, MD Taking Active             Patient Active Problem List   Diagnosis Date Noted   Pain in joint of right knee 07/15/2020   Sensorineural hearing loss (SNHL), bilateral 02/08/2020   Tinnitus of both ears 02/08/2020   Irritable bowel syndrome 03/09/2019   Acquired trigger finger of left middle finger 01/19/2018   Acquired trigger finger of left ring finger 01/19/2018   Fracture of base of fifth metacarpal bone 10/28/2017   Common bile duct stone 11/24/2016   Pulmonary nodules 11/18/2016   Type 2 diabetes mellitus with hyperlipidemia ([place]) 12/04/2015   GERD (gastroesophageal reflux disease) 03/08/2014   NAFLD (nonalcoholic fatty liver disease) 03/08/2014   Ascending aortic aneurysm 12/12/2012   INSOMNIA 09/15/2010   Atrial fibrillation, chronic ([place]) 09/15/2010   CAD (coronary artery disease) 07/09/2009   AORTIC VALVE DISORDERS 05/27/2009   Hyperlipidemia with target LDL less than 70 05/24/2009   Essential hypertension 05/24/2009   UNSPECIFIED SLEEP APNEA 05/24/2009    Immunization History   Administered Date(s) Administered   Influenza,inj,Quad PF,6+ Mos 05/17/2013, 05/03/2014, 04/25/2015, 04/10/2016, 04/23/2017, 05/05/2018, 04/20/2019, 04/26/2020   Pneumococcal Conjugate-13 11/24/2013   Pneumococcal Polysaccharide-23 04/20/2019   Td 11/11/2009   Tdap 11/11/2009, 01/25/2020   Zoster, Live 03/03/2011    Conditions to  be addressed/monitored: DMII  Care Plan : PHARMD MEDICATION MANAGEMENT  Updates made by Lavera Guise, RPH since 05/02/2021 12:00 AM     Problem: DISEASE PROGRESSION PREVENTION      Long-Range Goal: T2DM   Recent Progress: On track  Priority: High  Note:   Current Barriers:  Unable to independently afford treatment regimen Unable to achieve control of T2DM   Pharmacist Clinical Goal(s):  Over the next 90 days, patient will verbalize ability to afford treatment regimen achieve control of T2DM as evidenced by GOAL A1C<7%  through collaboration with PharmD and provider.   Interventions: 1:1 collaboration with Dettinger, Fransisca Kaufmann, MD regarding development and update of comprehensive plan of care as evidenced by provider attestation and co-signature Inter-disciplinary care team collaboration (see longitudinal plan of care) Comprehensive medication review performed; medication list updated in electronic medical record  Diabetes: Uncontrolled; current treatment: RYBELSUS 14MG , TOUJEO 18 UNITS, glipizide Denies personal and family history of Medullary thyroid cancer (MTC) DISCONTINUED FARGIXA CONTINUE RYBELSUS, METFORMIN, GLIPIZIDE (PLANS TO D/C THIS MEDICATION) A1C 7.4% Patient assistance supply arrived today (rybelsus 14mg  #2 bottles, 60 tabs)--will apply for tresiba for next year Application for novo nordisk to be sent next week when patient brings in financials for enrollment year 2023 Current glucose readings: fasting glucose: <150, post prandial glucose: N/A Denies hypoglycemic/hyperglycemic symptoms Discussed meal planning options and Plate  method for healthy eating Avoid sugary drinks and desserts Incorporate balanced protein, non starchy veggies, 1 serving of carbohydrate with each meal Increase water intake Increase physical activity as able Current exercise: ENCOURAGED  Educated on [employer] /GLP1 Assessed patient finances. RE-SENT PATIENT ASSISTANCE APPLICATION VIA NOVO NORDISK PATIENT ASSISTANCE PROGRAM (FOR RYBELSUS & TRESIBA);     Patient Goals/Self-Care Activities Over the next 90 days, patient will:  - take medications as prescribed check glucose DAILY (FASTING), document, and provide at future appointments  Follow Up Plan: Telephone follow up appointment with care management team member scheduled for: 1 MONTH      Medication Assistance:  RYBELSUS, TRESIBA obtained through [place] medication assistance program.  Enrollment ends 07/05/21  Patient's preferred pharmacy is:  [facility], [place] [address] [place] [address] Phone: [phone] Fax: [phone]  [facility] [place], [place] [address] Phone: [phone] Fax: [phone]  [facility] [place], [place] [address] [place] [address] Phone: [phone] Fax: [phone]   Follow Up:  Patient agrees to Care Plan and Follow-up.  Plan: Telephone follow up appointment with care management team member scheduled for:  2 MONTHS  Regina Eck, PharmD, BCPS Clinical Pharmacist, [place]  II Phone [phone]

## 2021-05-02 NOTE — Patient Instructions (Signed)
Visit Information  PATIENT GOALS:  Goals Addressed               This Visit's Progress     Patient Stated     T2DM-PHARMD (pt-stated)        Current Barriers:  Unable to independently afford treatment regimen Unable to achieve control of T2DM   Pharmacist Clinical Goal(s):  Over the next 90 days, patient will verbalize ability to afford treatment regimen achieve control of T2DM as evidenced by GOAL A1C<7% through collaboration with PharmD and provider.   Interventions: 1:1 collaboration with Dettinger, [name], MD regarding development and update of comprehensive plan of care as evidenced by provider attestation and co-signature Inter-disciplinary care team collaboration (see longitudinal plan of care) Comprehensive medication review performed; medication list updated in electronic medical record  Diabetes: Uncontrolled; current treatment: RYBELSUS 14MG , TOUJEO 18 UNITS, glipizide Denies personal and family history of Medullary thyroid cancer (MTC) DISCONTINUED FARGIXA CONTINUE RYBELSUS, METFORMIN, GLIPIZIDE (PLANS TO D/C THIS MEDICATION) A1C 7.4% Patient assistance supply arrived today (rybelsus 14mg  #2 bottles, 60 tabs)--will apply for tresiba for next year Application for novo nordisk to be sent next week when patient brings in financials for enrollment year 2023 Current glucose readings: fasting glucose: <150, post prandial glucose: N/A Denies hypoglycemic/hyperglycemic symptoms Discussed meal planning options and Plate method for healthy eating Avoid sugary drinks and desserts Incorporate balanced protein, non starchy veggies, 1 serving of carbohydrate with each meal Increase water intake Increase physical activity as able Current exercise: ENCOURAGED  Educated on /GLP1 Assessed patient finances. RE-SENT PATIENT ASSISTANCE APPLICATION VIA NOVO NORDISK PATIENT ASSISTANCE PROGRAM (FOR RYBELSUS & TRESIBA);     Patient Goals/Self-Care Activities Over the  next 90 days, patient will:  - take medications as prescribed check glucose DAILY (FASTING), document, and provide at future appointments  Follow Up Plan: Telephone follow up appointment with care management team member scheduled for: 1 MONTH         The patient verbalized understanding of instructions, educational materials, and care plan provided today and declined offer to receive copy of patient instructions, educational materials, and care plan.   Telephone follow up appointment with care management team member scheduled for: 1 MONTH  Signature 2024, PharmD, BCPS Clinical Pharmacist, [facility]  II Phone [phone]

## 2021-05-05 DIAGNOSIS — E1169 Type 2 diabetes mellitus with other specified complication: Secondary | ICD-10-CM

## 2021-05-05 DIAGNOSIS — E785 Hyperlipidemia, unspecified: Secondary | ICD-10-CM

## 2021-05-07 ENCOUNTER — Other Ambulatory Visit: Payer: Self-pay

## 2021-05-07 ENCOUNTER — Encounter: Payer: Self-pay | Admitting: Family Medicine

## 2021-05-07 ENCOUNTER — Ambulatory Visit (INDEPENDENT_AMBULATORY_CARE_PROVIDER_SITE_OTHER): Payer: HMO | Admitting: Family Medicine

## 2021-05-07 VITALS — BP 130/66 | HR 65 | Ht 70.0 in | Wt 204.0 lb

## 2021-05-07 DIAGNOSIS — E785 Hyperlipidemia, unspecified: Secondary | ICD-10-CM

## 2021-05-07 DIAGNOSIS — Z1211 Encounter for screening for malignant neoplasm of colon: Secondary | ICD-10-CM

## 2021-05-07 DIAGNOSIS — I10 Essential (primary) hypertension: Secondary | ICD-10-CM

## 2021-05-07 DIAGNOSIS — Z23 Encounter for immunization: Secondary | ICD-10-CM

## 2021-05-07 DIAGNOSIS — E1169 Type 2 diabetes mellitus with other specified complication: Secondary | ICD-10-CM

## 2021-05-07 LAB — BAYER DCA HB A1C WAIVED: HB A1C (BAYER DCA - WAIVED): 7.9 % — ABNORMAL HIGH (ref 4.8–5.6)

## 2021-05-07 MED ORDER — LISINOPRIL 20 MG PO TABS: 20.0000 mg | ORAL_TABLET | Freq: Every day | ORAL | 3 refills | Status: DC

## 2021-05-07 MED ORDER — GLIPIZIDE 5 MG PO TABS: 10.0000 mg | ORAL_TABLET | Freq: Two times a day (BID) | ORAL | 3 refills | Status: DC

## 2021-05-07 MED ORDER — ATENOLOL 25 MG PO TABS: 25.0000 mg | ORAL_TABLET | Freq: Every day | ORAL | 3 refills | Status: DC

## 2021-05-07 MED ORDER — ATORVASTATIN CALCIUM 40 MG PO TABS: 40.0000 mg | ORAL_TABLET | Freq: Every day | ORAL | 3 refills | Status: DC

## 2021-05-07 MED ORDER — METFORMIN HCL 1000 MG PO TABS: 1000.0000 mg | ORAL_TABLET | Freq: Two times a day (BID) | ORAL | 3 refills | Status: DC

## 2021-05-07 NOTE — Progress Notes (Signed)
BP 130/66   Pulse 65   Ht 5\' 10"  (1.778 m)   Wt 204 lb (92.5 kg)   SpO2 99%   BMI 29.27 kg/m    Subjective:   Patient ID: [name] [name], male    DOB: [birth year], 65 y.o.   MRN: [id number]  HPI: [name] [name] is a 65 y.o. male presenting on 05/07/2021 for Medical Management of Chronic Issues and Diabetes   HPI Type 2 diabetes mellitus Patient comes in today for recheck of his diabetes. Patient has been currently taking Tresiba 18 units daily and glipizide and metformin. Patient is currently on an ACE inhibitor/ARB. Patient has seen an ophthalmologist this year. Patient denies any issues with their feet. The symptom started onset as an adult hypertension and hyperlipidemia ARE RELATED TO DM, he says his blood sugars are running up and the 200s at times but down as low as the 90s and 130s in the AM.  Hypertension Patient is currently on atenolol and lisinopril, and their blood pressure today is 130/66. Patient denies any lightheadedness or dizziness. Patient denies headaches, blurred vision, chest pains, shortness of breath, or weakness. Denies any side effects from medication and is content with current medication.   Hyperlipidemia Patient is coming in for recheck of his hyperlipidemia. The patient is currently taking atorvastatin. They deny any issues with myalgias or history of liver damage from it. They deny any focal numbness or weakness or chest pain.   Patient uses temazepam occasionally, filled it once 3 months ago and has not filled the second refill, has refill for now.  Relevant past medical, surgical, family and social history reviewed and updated as indicated. Interim medical history since our last visit reviewed. Allergies and medications reviewed and updated.  Review of Systems  Constitutional:  Negative for chills and fever.  Eyes:  Negative for visual disturbance.  Respiratory:  Negative for shortness of breath and wheezing.   Cardiovascular:  Negative for chest  pain and leg swelling.  Musculoskeletal:  Negative for back pain and gait problem.  Skin:  Negative for rash.  Neurological:  Negative for dizziness, weakness and light-headedness.  Psychiatric/Behavioral:  Positive for sleep disturbance.   All other systems reviewed and are negative.  Per HPI unless specifically indicated above   Allergies as of 05/07/2021       Reactions   Glyxambi [empagliflozin-linagliptin] Diarrhea, Itching   He was able to take Jardiance alone with no side effects so it was the Tradjenta part that most likely gave him the reaction   Hctz [hydrochlorothiazide]    Nausea and headache    Sulfa Antibiotics Nausea And Vomiting   Sulfonamide Derivatives         Medication List        Accurate as of May 07, 2021  9:08 AM. If you have any questions, ask your nurse or doctor.          aspirin EC 81 MG tablet Take 81 mg by mouth daily.   atenolol 25 MG tablet Commonly known as: TENORMIN Take 1 tablet (25 mg total) by mouth daily.   atorvastatin 40 MG tablet Commonly known as: LIPITOR Take 1 tablet (40 mg total) by mouth daily.   blood glucose meter kit and supplies Kit Dispense based on patient and insurance preference. Use up to four times daily as directed. (FOR ICD-9 250.00, 250.01).   cholecalciferol 1000 units tablet Commonly known as: VITAMIN D Take 2,000 Units by mouth daily.   fexofenadine 180  MG tablet Commonly known as: ALLEGRA Take 180 mg by mouth daily.   fluticasone 50 MCG/ACT nasal spray Commonly known as: FLONASE Place 2 sprays into the nose daily as needed for allergies.   GAS-X PO Take 1 capsule by mouth daily.   glipiZIDE 5 MG tablet Commonly known as: GLUCOTROL Take 2 tablets (10 mg total) by mouth 2 (two) times daily before a meal. What changed:  how much to take how to take this when to take this additional instructions Changed by: [name] Lyan Moyano, MD   Global Ease Inject Pen Needles 31G X 5 MM  Misc Generic drug: Insulin Pen Needle Use daily as directed Dx E11.69   lisinopril 20 MG tablet Commonly known as: ZESTRIL Take 1 tablet (20 mg total) by mouth daily.   metFORMIN 1000 MG tablet Commonly known as: GLUCOPHAGE Take 1 tablet (1,000 mg total) by mouth 2 (two) times daily with a meal.   metroNIDAZOLE 250 MG tablet Commonly known as: FLAGYL Take 1 tablet (250 mg total) by mouth daily. As needed   OneTouch Delica Plus Lancet33G Misc 4 (four) times daily.   OneTouch Ultra test strip Generic drug: glucose blood Test BS 4 times daily Dx E11.69   promethazine 25 MG tablet Commonly known as: PHENERGAN TAKE 1 TABLET EVERY 4 HOURS AS NEEDED   Rybelsus 14 MG Tabs Generic drug: Semaglutide Take 14 mg by mouth daily.   sodium fluoride 1.1 % Gel dental gel Commonly known as: FLUORISHIELD PreviDent 5000 Dry Mouth 1.1 % dental paste   temazepam 15 MG capsule Commonly known as: RESTORIL TAKE ONE CAPSULE BY MOUTH AT BEDTIME AS NEEDED   Toujeo SoloStar 300 UNIT/ML Solostar Pen Generic drug: insulin glargine (1 Unit Dial) Inject 10-20 Units into the skin daily.         Objective:   BP 130/66   Pulse 65   Ht 5\' 10"  (1.778 m)   Wt 204 lb (92.5 kg)   SpO2 99%   BMI 29.27 kg/m   Wt Readings from Last 3 Encounters:  05/07/21 204 lb (92.5 kg)  03/13/21 200 lb 3.2 oz (90.8 kg)  02/03/21 200 lb 6 oz (90.9 kg)    Physical Exam Vitals and nursing note reviewed.  Constitutional:      General: He is not in acute distress.    Appearance: He is well-developed. He is not diaphoretic.  Eyes:     General: No scleral icterus.    Conjunctiva/sclera: Conjunctivae normal.  Neck:     Thyroid: No thyromegaly.  Cardiovascular:     Rate and Rhythm: Normal rate and regular rhythm.     Heart sounds: Normal heart sounds. No murmur heard. Pulmonary:     Effort: Pulmonary effort is normal. No respiratory distress.     Breath sounds: Normal breath sounds. No wheezing.   Musculoskeletal:        General: Normal range of motion.     Cervical back: Neck supple.  Lymphadenopathy:     Cervical: No cervical adenopathy.  Skin:    General: Skin is warm and dry.     Findings: No rash.  Neurological:     Mental Status: He is alert and oriented to person, place, and time.     Coordination: Coordination normal.  Psychiatric:        Behavior: Behavior normal.      Assessment & Plan:   Problem List Items Addressed This Visit       Cardiovascular and Mediastinum   Essential  hypertension   Relevant Medications   atenolol (TENORMIN) 25 MG tablet   atorvastatin (LIPITOR) 40 MG tablet   lisinopril (ZESTRIL) 20 MG tablet     Endocrine   Type 2 diabetes mellitus with hyperlipidemia (HCC) - Primary   Relevant Medications   atenolol (TENORMIN) 25 MG tablet   atorvastatin (LIPITOR) 40 MG tablet   glipiZIDE (GLUCOTROL) 5 MG tablet   lisinopril (ZESTRIL) 20 MG tablet   metFORMIN (GLUCOPHAGE) 1000 MG tablet   Other Relevant Orders   Bayer DCA Hb A1c Waived   BMP8+EGFR     Other   Hyperlipidemia with target LDL less than 70   Relevant Medications   atenolol (TENORMIN) 25 MG tablet   atorvastatin (LIPITOR) 40 MG tablet   lisinopril (ZESTRIL) 20 MG tablet   Other Visit Diagnoses     Colon cancer screening       Relevant Orders   Fecal occult blood, imunochemical       Increase Tresiba from 18 up to 20 units daily, no other change in medication.  Let us know if you have any low blood sugars. Follow up plan: Return in about 3 months (around 08/07/2021), or if symptoms worsen or fail to improve, for Hypertension and diabetes and cholesterol.  Counseling provided for all of the vaccine components Orders Placed This Encounter  Procedures   Fecal occult blood, imunochemical   Bayer DCA Hb A1c Waived   BMP8+EGFR    Arville Care, MD [place] [facility] 05/07/2021, 9:08 AM

## 2021-05-21 DIAGNOSIS — M25552 Pain in left hip: Secondary | ICD-10-CM | POA: Diagnosis not present

## 2021-06-01 ENCOUNTER — Other Ambulatory Visit: Payer: Self-pay | Admitting: Family Medicine

## 2021-06-03 DIAGNOSIS — M7062 Trochanteric bursitis, left hip: Secondary | ICD-10-CM | POA: Diagnosis not present

## 2021-06-03 DIAGNOSIS — M1612 Unilateral primary osteoarthritis, left hip: Secondary | ICD-10-CM | POA: Diagnosis not present

## 2021-06-06 ENCOUNTER — Other Ambulatory Visit: Payer: HMO

## 2021-06-06 DIAGNOSIS — Z1211 Encounter for screening for malignant neoplasm of colon: Secondary | ICD-10-CM

## 2021-06-07 LAB — FECAL OCCULT BLOOD, IMMUNOCHEMICAL: Fecal Occult Bld: NEGATIVE

## 2021-06-11 DIAGNOSIS — M7062 Trochanteric bursitis, left hip: Secondary | ICD-10-CM | POA: Diagnosis not present

## 2021-06-11 DIAGNOSIS — M25552 Pain in left hip: Secondary | ICD-10-CM | POA: Diagnosis not present

## 2021-06-17 DIAGNOSIS — M25552 Pain in left hip: Secondary | ICD-10-CM | POA: Diagnosis not present

## 2021-06-17 DIAGNOSIS — M7062 Trochanteric bursitis, left hip: Secondary | ICD-10-CM | POA: Diagnosis not present

## 2021-06-18 ENCOUNTER — Ambulatory Visit (INDEPENDENT_AMBULATORY_CARE_PROVIDER_SITE_OTHER): Payer: HMO | Admitting: Pharmacist

## 2021-06-18 DIAGNOSIS — E1169 Type 2 diabetes mellitus with other specified complication: Secondary | ICD-10-CM

## 2021-06-18 DIAGNOSIS — E785 Hyperlipidemia, unspecified: Secondary | ICD-10-CM

## 2021-06-19 DIAGNOSIS — M7062 Trochanteric bursitis, left hip: Secondary | ICD-10-CM | POA: Diagnosis not present

## 2021-06-19 DIAGNOSIS — M25552 Pain in left hip: Secondary | ICD-10-CM | POA: Diagnosis not present

## 2021-06-23 ENCOUNTER — Encounter: Payer: Self-pay | Admitting: Family Medicine

## 2021-06-23 ENCOUNTER — Encounter: Payer: Self-pay | Admitting: Family

## 2021-06-23 ENCOUNTER — Ambulatory Visit (INDEPENDENT_AMBULATORY_CARE_PROVIDER_SITE_OTHER): Payer: HMO | Admitting: Family

## 2021-06-23 VITALS — BP 173/73 | HR 70 | Temp 97.5°F

## 2021-06-23 DIAGNOSIS — U071 COVID-19: Secondary | ICD-10-CM | POA: Diagnosis not present

## 2021-06-23 MED ORDER — PROMETHAZINE-DM 6.25-15 MG/5ML PO SYRP: 5.0000 mL | ORAL_SOLUTION | Freq: Three times a day (TID) | ORAL | 0 refills | Status: DC | PRN

## 2021-06-23 MED ORDER — MOLNUPIRAVIR EUA 200MG CAPSULE
4.0000 | ORAL_CAPSULE | Freq: Two times a day (BID) | ORAL | 0 refills | Status: AC
Start: 2021-06-23 — End: 2021-06-28

## 2021-06-23 MED ORDER — BENZONATATE 200 MG PO CAPS
200.0000 mg | ORAL_CAPSULE | Freq: Three times a day (TID) | ORAL | 1 refills | Status: DC | PRN
Start: 2021-06-23 — End: 2021-12-02

## 2021-06-23 NOTE — Patient Instructions (Signed)
10 Things You Can Do to Manage Your COVID-19 Symptoms at Home ?If you have possible or confirmed COVID-19 ?Stay home except to get medical care. ?Monitor your symptoms carefully. If your symptoms get worse, call your healthcare provider immediately. ?Get rest and stay hydrated. ?If you have a medical appointment, call the healthcare provider ahead of time and tell them that you have or may have COVID-19. ?For medical emergencies, call 911 and notify the dispatch personnel that you have or may have COVID-19. ?Cover your cough and sneezes with a tissue or use the inside of your elbow. ?Wash your hands often with soap and water for at least 20 seconds or clean your hands with an alcohol-based hand sanitizer that contains at least 60% alcohol. ?As much as possible, stay in a specific room and away from other people in your home. Also, you should use a separate bathroom, if available. If you need to be around other people in or outside of the home, wear a mask. ?Avoid sharing personal items with other people in your household, like dishes, towels, and bedding. ?Clean all surfaces that are touched often, like counters, tabletops, and doorknobs. Use household cleaning sprays or wipes according to the label instructions. ?cdc.gov/coronavirus ?01/19/2020 ?This information is not intended to replace advice given to you by your health care provider. Make sure you discuss any questions you have with your health care provider. ?Document Revised: 03/14/2021 Document Reviewed: 03/14/2021 ?Elsevier Patient Education ? 2022 Elsevier Inc. ? ?

## 2021-06-23 NOTE — Progress Notes (Signed)
Subjective:    Patient ID: [name] [name], male    DOB: 04-17-1956, 65 y.o.   MRN: [id number]  Chief Complaint  Patient presents with   Cough   Nasal Congestion   PT presents to the office today with positive COVID. He reports his symptoms started 4 days ago.  Cough This is a new problem. The current episode started in the past 7 days. The problem has been gradually worsening. The problem occurs every few minutes. The cough is Non-productive. Associated symptoms include chills, headaches, myalgias, nasal congestion, postnasal drip and shortness of breath. Pertinent negatives include no ear congestion, ear pain, fever or wheezing. He has tried rest and OTC cough suppressant for the symptoms. The treatment provided mild relief.     Review of Systems  Constitutional:  Positive for chills. Negative for fever.  HENT:  Positive for postnasal drip. Negative for ear pain.   Respiratory:  Positive for cough and shortness of breath. Negative for wheezing.   Musculoskeletal:  Positive for myalgias.  Neurological:  Positive for headaches.  All other systems reviewed and are negative.     Objective:   Physical Exam Vitals reviewed.  Constitutional:      General: He is not in acute distress.    Appearance: He is well-developed.  HENT:     Head: Normocephalic.     Right Ear: Tympanic membrane normal.     Left Ear: Tympanic membrane normal.  Eyes:     General:        Right eye: No discharge.        Left eye: No discharge.     Pupils: Pupils are equal, round, and reactive to light.  Neck:     Thyroid: No thyromegaly.  Cardiovascular:     Rate and Rhythm: Normal rate and regular rhythm.     Heart sounds: Normal heart sounds. No murmur heard. Pulmonary:     Effort: Pulmonary effort is normal. No respiratory distress.     Breath sounds: Normal breath sounds. No wheezing.  Abdominal:     General: Bowel sounds are normal. There is no distension.     Palpations: Abdomen is soft.      Tenderness: There is no abdominal tenderness.  Musculoskeletal:        General: No tenderness. Normal range of motion.     Cervical back: Normal range of motion and neck supple.  Skin:    General: Skin is warm and dry.     Findings: No erythema or rash.  Neurological:     Mental Status: He is alert and oriented to person, place, and time.     Cranial Nerves: No cranial nerve deficit.     Deep Tendon Reflexes: Reflexes are normal and symmetric.  Psychiatric:        Behavior: Behavior normal.        Thought Content: Thought content normal.        Judgment: Judgment normal.     BP (!) 173/73    Pulse 70    Temp (!) 97.5 F (36.4 C) (Oral)    SpO2 99%       Assessment & Plan:  [name] [name] comes in today with chief complaint of Cough and Nasal Congestion   Diagnosis and orders addressed:  1. COVID-19 COVID positive, rest, force fluids, tylenol as needed, Quarantine for at least 5 days and you are fever free, then must wear a mask out in public from day 6-10, report any worsening symptoms  such as increased shortness of breath, swelling, or continued high fevers. Possible adverse effects discussed with antivirals.  - molnupiravir EUA (LAGEVRIO) 200 mg CAPS capsule; Take 4 capsules (800 mg total) by mouth 2 (two) times daily for 5 days.  Dispense: 40 capsule; Refill: 0 - benzonatate (TESSALON) 200 MG capsule; Take 1 capsule (200 mg total) by mouth 3 (three) times daily as needed.  Dispense: 30 capsule; Refill: 1 - promethazine-dextromethorphan (PROMETHAZINE-DM) 6.25-15 MG/5ML syrup; Take 5 mLs by mouth 3 (three) times daily as needed for cough.  Dispense: 118 mL; Refill: 0   Jannifer Rodney, FNP

## 2021-06-25 NOTE — Patient Instructions (Signed)
Visit Information  Thank you for taking time to visit with me today. Please don't hesitate to contact me if I can be of assistance to you before our next scheduled telephone appointment.  Following are the goals we discussed today:  Current Barriers:  Unable to independently afford treatment regimen Unable to achieve control of T2DM   Pharmacist Clinical Goal(s):  Over the next 90 days, patient will verbalize ability to afford treatment regimen achieve control of T2DM as evidenced by GOAL A1C<7% through collaboration with PharmD and provider.   Interventions: 1:1 collaboration with Dettinger, [name], MD regarding development and update of comprehensive plan of care as evidenced by provider attestation and co-signature Inter-disciplinary care team collaboration (see longitudinal plan of care) Comprehensive medication review performed; medication list updated in electronic medical record  Diabetes: Uncontrolled (A1C 7.9%, GFR 53); current treatment: RYBELSUS 14MG , TRESIBA 18 UNITS, glipizide Denies personal and family history of Medullary thyroid cancer (MTC) DISCONTINUED FARGIXA CONTINUE RYBELSUS, TRESIBA, METFORMIN, GLIPIZIDE (PLANS TO D/C THIS MEDICATION) A1C 7.4% Received notification from [employer] regarding RE-ENROLLMENT approval for TRESIBA & RYBELSUS 14MG . Patient assistance approved from 07/06/21 to 07/05/22. Phone: [phone] Current glucose readings: fasting glucose: <150, post prandial glucose: N/A Denies hypoglycemic/hyperglycemic symptoms Discussed meal planning options and Plate method for healthy eating Avoid sugary drinks and desserts Incorporate balanced protein, non starchy veggies, 1 serving of carbohydrate with each meal Increase water intake Increase physical activity as able Current exercise: ENCOURAGED  Educated on RYBELSUS /GLP1; LDL at goal<70, continue current meds Assessed patient finances. Received notification from [employer] regarding  RE-ENROLLMENT approval for TRESIBA & RYBELSUS 14MG . Patient assistance approved from 07/06/21 to 07/05/22.     Patient Goals/Self-Care Activities Over the next 90 days, patient will:  - take medications as prescribed check glucose DAILY (FASTING), document, and provide at future appointments  Follow Up Plan: Telephone follow up appointment with care management team member scheduled for: 3 MONTHs   Please call the care guide team at [phone] if you need to cancel or reschedule your appointment.    The patient verbalized understanding of instructions, educational materials, and care plan provided today and declined offer to receive copy of patient instructions, educational materials, and care plan.   Signature 09/03/21, PharmD, BCPS Clinical Pharmacist, 07/07/22 [facility]  II Phone [phone]

## 2021-06-25 NOTE — Progress Notes (Signed)
Chronic Care Management Pharmacy Note  06/18/2021 Name:  [name] [name] MRN:  [id number] DOB:  08/28/1955  Summary: T2DM, HLD  Recommendations/Changes made from today's visit:  Diabetes: Uncontrolled (A1C 7.9%, GFR 53); current treatment: RYBELSUS 14MG , TRESIBA 18 UNITS, glipizide Denies personal and family history of Medullary thyroid cancer (MTC) DISCONTINUED FARGIXA CONTINUE RYBELSUS, TRESIBA, METFORMIN, GLIPIZIDE (PLANS TO D/C THIS MEDICATION) A1C 7.4% Received notification from [place] regarding RE-ENROLLMENT approval for [place] 14MG . Patient assistance approved from 07/06/21 to 07/05/22. Phone: [phone] Current glucose readings: fasting glucose: <150, post prandial glucose: N/A Denies hypoglycemic/hyperglycemic symptoms Discussed meal planning options and Plate method for healthy eating Avoid sugary drinks and desserts Incorporate balanced protein, non starchy veggies, 1 serving of carbohydrate with each meal Increase water intake Increase physical activity as able Current exercise: ENCOURAGED  Educated on RYBELSUS /GLP1; LDL at goal<70, continue current meds Assessed patient finances. Received notification from [place] regarding RE-ENROLLMENT approval for [place] 14MG . Patient assistance approved from 07/06/21 to 07/05/22.     Patient Goals/Self-Care Activities Over the next 90 days, patient will:  - take medications as prescribed check glucose DAILY (FASTING), document, and provide at future appointments  Follow Up Plan: Telephone follow up appointment with care management team member scheduled for: 3 MONTHs  Subjective: [name] [name] is an 65 y.o. year old male who is a primary patient of Dettinger, Fransisca Kaufmann, MD.  The CCM team was consulted for assistance with disease management and care coordination needs.    Engaged with patient by telephone for follow up visit in response to provider referral for pharmacy case management  and/or care coordination services.   Consent to Services:  The patient was given information about Chronic Care Management services, agreed to services, and gave verbal consent prior to initiation of services.  Please see initial visit note for detailed documentation.   Patient Care Team: Dettinger, Fransisca Kaufmann, MD as PCP - General (Family Medicine) Arnoldo Lenis, MD as PCP - Cardiology (Cardiology) Cathe Mons, MD as Consulting Physician (Internal Medicine) Lavera Guise, [facility] (Pharmacist)  Objective:  Lab Results  Component Value Date   CREATININE 1.47 (H) 02/03/2021   CREATININE 1.41 (H) 07/29/2020   CREATININE 1.39 (H) 04/26/2020    Lab Results  Component Value Date   HGBA1C 7.9 (H) 05/07/2021   Last diabetic Eye exam:  Lab Results  Component Value Date/Time   HMDIABEYEEXA No Retinopathy 12/23/2016 12:00 AM    Last diabetic Foot exam: No results found for: HMDIABFOOTEX      Component Value Date/Time   CHOL 102 02/03/2021 1138   CHOL 76 12/05/2012 0931   TRIG 119 02/03/2021 1138   TRIG 180 (H) 01/19/2017 1131   TRIG 43 12/05/2012 0931   HDL 29 (L) 02/03/2021 1138   HDL 28 (L) 01/19/2017 1131   HDL 36 (L) 12/05/2012 0931   CHOLHDL 3.5 02/03/2021 1138   LDLCALC 51 02/03/2021 1138   LDLCALC 50 02/28/2014 1116   LDLCALC 31 12/05/2012 0931    Hepatic Function Latest Ref Rng & Units 02/03/2021 07/29/2020 04/26/2020  Total Protein 6.0 - 8.5 g/dL 7.2 7.5 7.2  Albumin 3.8 - 4.8 g/dL 4.8 4.7 4.6  AST 0 - 40 IU/L 19 38 27  ALT 0 - 44 IU/L 27 50(H) 37  Alk Phosphatase 44 - 121 IU/L 70 80 68  Total Bilirubin 0.0 - 1.2 mg/dL 0.5 0.4 0.4  Bilirubin, Direct 0.00 - 0.40 mg/dL - - -  Lab Results  Component Value Date/Time   TSH 1.740 03/26/2015 09:24 AM    CBC Latest Ref Rng & Units 02/03/2021 07/29/2020 04/26/2020  WBC 3.4 - 10.8 x10E3/uL 6.2 6.3 5.9  Hemoglobin 13.0 - 17.7 g/dL 12.6(L) 12.7(L) 11.8(L)  Hematocrit 37.5 - 51.0 % 37.2(L) 37.7 35.6(L)   Platelets 150 - 450 x10E3/uL 183 198 222    Lab Results  Component Value Date/Time   VD25OH 48.5 12/21/2018 08:18 AM   VD25OH 31.8 08/02/2018 04:50 PM    Clinical ASCVD: No  The ASCVD Risk score (Arnett DK, et al., 2019) failed to calculate for the following reasons:   The valid total cholesterol range is 130 to 320 mg/dL    Other: ([id number] if Afib, PHQ9 if depression, MMRC or CAT for COPD, ACT, DEXA)  Social History   Tobacco Use  Smoking Status Former   Packs/day: 1.50   Years: 25.00   Pack years: 37.50   Types: Cigarettes   Start date: 07/06/1974   Quit date: 07/06/2002   Years since quitting: 18.9  Smokeless Tobacco Never   BP Readings from Last 3 Encounters:  06/23/21 (!) 173/73  05/07/21 130/66  03/13/21 (!) 145/65   Pulse Readings from Last 3 Encounters:  06/23/21 70  05/07/21 65  03/13/21 62   Wt Readings from Last 3 Encounters:  05/07/21 204 lb (92.5 kg)  03/13/21 200 lb 3.2 oz (90.8 kg)  02/03/21 200 lb 6 oz (90.9 kg)    Assessment: Review of patient past medical history, allergies, medications, health status, including review of consultants reports, laboratory and other test data, was performed as part of comprehensive evaluation and provision of chronic care management services.   SDOH:  (Social Determinants of Health) assessments and interventions performed:    CCM Care Plan  Allergies  Allergen Reactions   Glyxambi [Empagliflozin-Linagliptin] Diarrhea and Itching    He was able to take Jardiance alone with no side effects so it was the Tradjenta part that most likely gave him the reaction   Hctz [Hydrochlorothiazide]     Nausea and headache    Sulfa Antibiotics Nausea And Vomiting   Sulfonamide Derivatives     Medications Reviewed Today     Reviewed by Danella Maiers, [facility] (Pharmacist) on 06/25/21 at 1246  Med List Status: <None>   Medication Order Taking? Sig Documenting Provider Last Dose Status Informant  aspirin EC 81 MG tablet  [id number]  Take 81 mg by mouth daily. [provider]  Active Self  atenolol (TENORMIN) 25 MG tablet [id number]  Take 1 tablet (25 mg total) by mouth daily. Dettinger, [name], MD  Active   atorvastatin (LIPITOR) 40 MG tablet [id number]  Take 1 tablet (40 mg total) by mouth daily. Dettinger, [name], MD  Active   benzonatate (TESSALON) 200 MG capsule [id number]  Take 1 capsule (200 mg total) by mouth 3 (three) times daily as needed. Jannifer Rodney A, FNP  Active   blood glucose meter kit and supplies KIT [id number]  Dispense based on patient and insurance preference. Use up to four times daily as directed. (FOR ICD-9 250.00, 250.01). Dettinger, [name], MD  Active   cholecalciferol (VITAMIN D) 1000 UNITS tablet [id number]  Take 2,000 Units by mouth daily. [provider]  Active Self           Med Note Joanne Gavel, CRYSTAL L   Wed Mar 20, 2020  1:54 PM) Per patient he takes BID  fexofenadine (ALLEGRA) 180 MG tablet [id number]  Take 180 mg by mouth daily.  [provider]  Active Self           Med Note Celesta Aver, [facility] M   Thu Mar 13, 2021 11:45 AM) 1 -2 a week  fluticasone (FLONASE) 50 MCG/ACT nasal spray [id number]  Place 2 sprays into the nose daily as needed for allergies.  Chipper Herb, MD  Active Self  glipiZIDE (GLUCOTROL) 5 MG tablet [id number]  Take 2 tablets (10 mg total) by mouth 2 (two) times daily before a meal. Dettinger, Fransisca Kaufmann, MD  Active   insulin glargine, 1 Unit Dial, (TOUJEO SOLOSTAR) 300 UNIT/ML Solostar Pen [id number]  Inject 10-20 Units into the skin daily. Dettinger, Fransisca Kaufmann, MD  Active   Insulin Pen Needle (GLOBAL EASE INJECT PEN NEEDLES) 31G X 5 MM MISC [id number]  Use daily as directed Dx E11.69 Dettinger, Fransisca Kaufmann, MD  Active   Lancets (ONETOUCH DELICA PLUS [id number]) [place] [id number]  4 (four) times daily. [provider]  Active   lisinopril (ZESTRIL) 20 MG tablet [id number]  Take 1 tablet (20 mg total) by mouth daily. Dettinger, Fransisca Kaufmann, MD   Active   metFORMIN (GLUCOPHAGE) 1000 MG tablet [id number]  Take 1 tablet (1,000 mg total) by mouth 2 (two) times daily with a meal. Dettinger, Fransisca Kaufmann, MD  Active   metroNIDAZOLE (FLAGYL) 250 MG tablet [id number]  Take 1 tablet (250 mg total) by mouth daily. As needed Carlan, Deatra Robinson, NP  Active   molnupiravir EUA (LAGEVRIO) 200 mg CAPS capsule [id number]  Take 4 capsules (800 mg total) by mouth 2 (two) times daily for 5 days. Sharion Balloon, FNP  Active   [facility] ULTRA test strip [id number]  Test BS 4 times daily Dx E11.69 Dettinger, Fransisca Kaufmann, MD  Active   promethazine (PHENERGAN) 25 MG tablet [id number]  TAKE 1 TABLET BY MOUTH EVERY 4 HOURS AS NEEDED Dettinger, Fransisca Kaufmann, MD  Active   promethazine-dextromethorphan (PROMETHAZINE-DM) 6.25-15 MG/5ML syrup [id number]  Take 5 mLs by mouth 3 (three) times daily as needed for cough. Hawks, Christy A, FNP  Active   Semaglutide (RYBELSUS) 14 MG TABS [id number]  Take 14 mg by mouth daily. Dettinger, Fransisca Kaufmann, MD  Active            Med Note Blanca Friend, Royce Macadamia   Fri May 02, 2021 10:30 AM) VIA [employer] PATIENT ASSISTANCE PROGRAM  Simethicone (GAS-X PO) [id number]  Take 1 capsule by mouth daily. [provider]  Active Self           Med Note Angela Adam, CRYSTAL L   Wed Mar 20, 2020  1:55 PM) Per patient as needed.  sodium fluoride (FLUORISHIELD) 1.1 % GEL dental gel [id number]  PreviDent 5000 Dry Mouth 1.1 % dental paste [provider]  Active   temazepam (RESTORIL) 15 MG capsule [id number]  TAKE ONE CAPSULE BY MOUTH AT BEDTIME AS NEEDED Dettinger, Fransisca Kaufmann, MD  Active             Patient Active Problem List   Diagnosis Date Noted   Sensorineural hearing loss (SNHL), bilateral 02/08/2020   Tinnitus of both ears 02/08/2020   Irritable bowel syndrome 03/09/2019   Common bile duct stone 11/24/2016   Pulmonary nodules 11/18/2016   Type 2 diabetes mellitus with hyperlipidemia ([place]) 12/04/2015   GERD (gastroesophageal reflux disease)  03/08/2014   NAFLD (nonalcoholic fatty liver disease) 03/08/2014   Ascending aortic aneurysm 12/12/2012   INSOMNIA 09/15/2010   Atrial fibrillation, chronic (  [place]) 09/15/2010   CAD (coronary artery disease) 07/09/2009   AORTIC VALVE DISORDERS 05/27/2009   Hyperlipidemia with target LDL less than 70 05/24/2009   Essential hypertension 05/24/2009   UNSPECIFIED SLEEP APNEA 05/24/2009    Immunization History  Administered Date(s) Administered   Fluad Quad(high Dose 65+) 05/07/2021   Influenza,inj,Quad PF,6+ Mos 05/17/2013, 05/03/2014, 04/25/2015, 04/10/2016, 04/23/2017, 05/05/2018, 04/20/2019, 04/26/2020   Pneumococcal Conjugate-13 11/24/2013   Pneumococcal Polysaccharide-23 04/20/2019   Td 11/11/2009   Tdap 11/11/2009, 01/25/2020   Zoster, Live 03/03/2011    Conditions to be addressed/monitored: HLD and DMII  Care Plan : PHARMD MEDICATION MANAGEMENT  Updates made by Lavera Guise, [place] since 06/25/2021 12:00 AM     Problem: DISEASE PROGRESSION PREVENTION      Long-Range Goal: T2DM   Recent Progress: On track  Priority: High  Note:   Current Barriers:  Unable to independently afford treatment regimen Unable to achieve control of T2DM   Pharmacist Clinical Goal(s):  Over the next 90 days, patient will verbalize ability to afford treatment regimen achieve control of T2DM as evidenced by GOAL A1C<7%  through collaboration with PharmD and provider.   Interventions: 1:1 collaboration with Dettinger, Fransisca Kaufmann, MD regarding development and update of comprehensive plan of care as evidenced by provider attestation and co-signature Inter-disciplinary care team collaboration (see longitudinal plan of care) Comprehensive medication review performed; medication list updated in electronic medical record  Diabetes: Uncontrolled (A1C 7.9%, GFR 53); current treatment: RYBELSUS 14MG , TRESIBA 18 UNITS, glipizide Denies personal and family history of Medullary thyroid cancer  (MTC) DISCONTINUED FARGIXA CONTINUE RYBELSUS, TRESIBA, METFORMIN, GLIPIZIDE (PLANS TO D/C THIS MEDICATION) A1C 7.4% Received notification from [place] regarding RE-ENROLLMENT approval for [place] 14MG . Patient assistance approved from 07/06/21 to 07/05/22. Phone: [phone] Current glucose readings: fasting glucose: <150, post prandial glucose: N/A Denies hypoglycemic/hyperglycemic symptoms Discussed meal planning options and Plate method for healthy eating Avoid sugary drinks and desserts Incorporate balanced protein, non starchy veggies, 1 serving of carbohydrate with each meal Increase water intake Increase physical activity as able Current exercise: ENCOURAGED  Educated on RYBELSUS /GLP1; LDL at goal<70, continue current meds Assessed patient finances. Received notification from [place] regarding RE-ENROLLMENT approval for TRESIBA & RYBELSUS 14MG . Patient assistance approved from 07/06/21 to 07/05/22.     Patient Goals/Self-Care Activities Over the next 90 days, patient will:  - take medications as prescribed check glucose DAILY (FASTING), document, and provide at future appointments  Follow Up Plan: Telephone follow up appointment with care management team member scheduled for: 3 MONTHs      Medication Assistance:  TRESIBA/RYBELSUSobtained through [place] medication assistance program.  Enrollment ends 07/05/22  Patient's preferred pharmacy is:  [facility], [place] [address] [place] [address] Phone: [phone] Fax: [phone]  [facility] [place], [place] [address] Phone: [phone] Fax: [phone]  [facility] [place], [place] [address] [place] [address] Phone: [phone] Fax: [phone]  Follow Up:  Patient agrees to Care Plan and Follow-up.  Plan:  Telephone follow up appointment with care management team member scheduled for:  3 MONTHS   Regina Eck, PharmD, BCPS Clinical Pharmacist, [place]  II Phone [phone]

## 2021-06-25 NOTE — Progress Notes (Signed)
Received notification from [employer] regarding RE-ENROLLMENT approval for TRESIBA & RYBELSUS 14MG . Patient assistance approved from 07/06/21 to 07/05/22.  Phone: [phone]

## 2021-06-26 ENCOUNTER — Encounter: Payer: Self-pay | Admitting: Family Medicine

## 2021-07-01 ENCOUNTER — Encounter: Payer: Self-pay | Admitting: Family Medicine

## 2021-07-01 NOTE — Telephone Encounter (Signed)
I called pt on cell phone and advised we don't have any openings for today and he ntbs to be evaluated today per Christy Hawks-pt advised to go to Urgent Care or ER for evaluation. Pt voiced understanding.

## 2021-07-02 ENCOUNTER — Encounter: Payer: Self-pay | Admitting: Nurse Practitioner

## 2021-07-02 ENCOUNTER — Ambulatory Visit (INDEPENDENT_AMBULATORY_CARE_PROVIDER_SITE_OTHER): Payer: HMO | Admitting: Nurse Practitioner

## 2021-07-02 DIAGNOSIS — R112 Nausea with vomiting, unspecified: Secondary | ICD-10-CM | POA: Diagnosis not present

## 2021-07-02 DIAGNOSIS — K591 Functional diarrhea: Secondary | ICD-10-CM | POA: Diagnosis not present

## 2021-07-02 MED ORDER — PROMETHAZINE HCL 25 MG PO TABS
25.0000 mg | ORAL_TABLET | Freq: Three times a day (TID) | ORAL | 0 refills | Status: DC | PRN
Start: 2021-07-02 — End: 2021-07-17

## 2021-07-02 NOTE — Patient Instructions (Signed)
Nausea and Vomiting, Adult °Nausea is feeling that you have an upset stomach and that you are about to vomit. Vomiting is when food in your stomach forcefully comes out of your mouth. Vomiting can make you feel weak. If you vomit, or if you are not able to drink enough fluids, you may not have enough water in your body (get dehydrated). If you do not have enough water in your body, you may: °Feel tired. °Feel thirsty. °Have a dry mouth. °Have cracked lips. °Pee (urinate) less often. °Older adults and people with other diseases or a weak body defense system (immune system) are at higher risk for not having enough water in the body. If you feel like you may vomit or you vomit, it is important to follow instructions from your doctor about how to take care of yourself. °Follow these instructions at home: °Watch your symptoms for any changes. Tell your doctor about them. °Eating and drinking °  °Take an ORS (oral rehydration solution). This is a drink that is sold at pharmacies and stores. °Drink clear fluids in small amounts as you are able, such as: °Water. °Ice chips. °Fruit juice that has water added (diluted fruit juice). °Low-calorie sports drinks. °Eat bland, easy-to-digest foods in small amounts as you are able, such as: °Bananas. °Applesauce. °Rice. °Low-fat (lean) meats. °Toast. °Crackers. °Avoid drinking fluids that have a lot of sugar or caffeine in them. This includes energy drinks, sports drinks, and soda. °Avoid alcohol. °Avoid spicy or fatty foods. °General instructions °Take over-the-counter and prescription medicines only as told by your doctor. °Drink enough fluid to keep your pee (urine) pale yellow. °Wash your hands often with soap and water for at least 20 seconds. If you cannot use soap and water, use hand sanitizer. °Make sure that everyone in your home washes their hands well and often. °Rest at home until you feel better. °Watch your condition for any changes. °Take slow and deep breaths when  you feel like you may vomit. °Keep all follow-up visits. °Contact a doctor if: °Your symptoms get worse. °You have new symptoms. °You have a fever. °You cannot drink fluids without vomiting. °You feel like you may vomit for more than 2 days. °You feel light-headed or dizzy. °You have a headache. °You have muscle cramps. °You have a rash. °You have pain while peeing. °Get help right away if: °You have pain in your chest, neck, arm, or jaw. °You feel very weak or you faint. °You vomit again and again. °You have vomit that is bright red or looks like black coffee grounds. °You have bloody or black poop (stools) or poop that looks like tar. °You have a very bad headache, a stiff neck, or both. °You have very bad pain, cramping, or bloating in your belly (abdomen). °You have trouble breathing. °You are breathing very quickly. °Your heart is beating very quickly. °Your skin feels cold and clammy. °You feel confused. °You have signs of losing too much water in your body, such as: °Dark pee, very little pee, or no pee. °Cracked lips. °Dry mouth. °Sunken eyes. °Sleepiness. °Weakness. °These symptoms may be an emergency. Get help right away. Call 911. °Do not wait to see if the symptoms will go away. °Do not drive yourself to the hospital. °Summary °Nausea is feeling that you have an upset stomach and that you are about to vomit. Vomiting is when food in your stomach comes out of your mouth. °Follow instructions from your doctor about eating and drinking. °Take over-the-counter   and prescription medicines only as told by your doctor. °Contact your doctor if your symptoms get worse or you have new symptoms. °Keep all follow-up visits. °This information is not intended to replace advice given to you by your health care provider. Make sure you discuss any questions you have with your health care provider. °Document Revised: 12/27/2020 Document Reviewed: 12/27/2020 °Elsevier Patient Education © 2022 [employer] ° °

## 2021-07-02 NOTE — Progress Notes (Signed)
Virtual Visit  Note Due to COVID-19 pandemic this visit was conducted virtually. This visit type was conducted due to national recommendations for restrictions regarding the COVID-19 Pandemic (e.g. social distancing, sheltering in place) in an effort to limit this patient's exposure and mitigate transmission in our community. All issues noted in this document were discussed and addressed.  A physical exam was not performed with this format.  I connected with [name] [name] on 07/02/21 at 9:11 by telephone and verified that I am speaking with the correct person using two identifiers. [name] [name] is currently located at home and his wife is currently with him during visit. The provider, Mary-Margaret Daphine Deutscher, FNP is located in their office at time of visit.  I discussed the limitations, risks, security and privacy concerns of performing an evaluation and management service by telephone and the availability of in person appointments. I also discussed with the patient that there may be a patient responsible charge related to this service. The patient expressed understanding and agreed to proceed.   History and Present Illness:  Patient states that he became sick about 2 weeks ago. Tried OTC meds and started getting better. Two days he developed fever and chills, with very poor appetite due to nausea. He has had some diarrhea and vomiting. He dia a home covid test yesterday and tested positive again. He had positive test on dec 18 and was treated with antiviral.     Review of Systems  Constitutional:  Positive for malaise/fatigue. Negative for chills and fever.  Respiratory: Negative.    Cardiovascular: Negative.   Gastrointestinal:  Positive for diarrhea, nausea and vomiting. Negative for abdominal pain.    Observations/Objective: Alert and oriented- answers all questions appropriately No distress Voice sounds weak  Assessment and Plan: [name] [name] in today with chief complaint of No  chief complaint on file.   1. Nausea and vomiting, unspecified vomiting type - promethazine (PHENERGAN) 25 MG tablet; Take 1 tablet (25 mg total) by mouth every 8 (eight) hours as needed for nausea or vomiting.  Dispense: 20 tablet; Refill: 0  2. Functional diarrhea Imodium AD First 24 Hours-Clear liquids  popsicles  Jello  gatorade  Sprite Second 24 hours-Add Full liquids ( Liquids you cant see through) Third 24 hours- Bland diet ( foods that are baked or broiled)  *avoiding fried foods and highly spiced foods* During these 3 days  Avoid milk, cheese, ice cream or any other dairy products  Avoid caffeine- REMEMBER Mt. Dew and Mello Yellow contain lots of caffeine You should eat and drink in  Frequent small volumes If no improvement in symptoms or worsen in 2-3 days should RETRUN TO OFFICE or go to ER!      Follow Up Instructions: prn    I discussed the assessment and treatment plan with the patient. The patient was provided an opportunity to ask questions and all were answered. The patient agreed with the plan and demonstrated an understanding of the instructions.   The patient was advised to call back or seek an in-person evaluation if the symptoms worsen or if the condition fails to improve as anticipated.  The above assessment and management plan was discussed with the patient. The patient verbalized understanding of and has agreed to the management plan. Patient is aware to call the clinic if symptoms persist or worsen. Patient is aware when to return to the clinic for a follow-up visit. Patient educated on when it is appropriate to go to the  emergency department.   Time call ended:  9:25  I provided 14 minutes of  non face-to-face time during this encounter.    Mary-Margaret Daphine Deutscher, FNP

## 2021-07-03 ENCOUNTER — Ambulatory Visit: Payer: HMO | Admitting: Nurse Practitioner

## 2021-07-04 ENCOUNTER — Encounter (HOSPITAL_COMMUNITY): Payer: Self-pay | Admitting: *Deleted

## 2021-07-04 ENCOUNTER — Other Ambulatory Visit: Payer: Self-pay

## 2021-07-04 ENCOUNTER — Emergency Department (HOSPITAL_COMMUNITY): Payer: HMO

## 2021-07-04 ENCOUNTER — Inpatient Hospital Stay (HOSPITAL_COMMUNITY)
Admission: EM | Admit: 2021-07-04 | Discharge: 2021-07-09 | DRG: 177 | Disposition: A | Discharge status: Home Health | Payer: HMO | Attending: Internal Medicine | Admitting: Internal Medicine

## 2021-07-04 DIAGNOSIS — Z952 Presence of prosthetic heart valve: Secondary | ICD-10-CM

## 2021-07-04 DIAGNOSIS — E785 Hyperlipidemia, unspecified: Secondary | ICD-10-CM | POA: Diagnosis not present

## 2021-07-04 DIAGNOSIS — Z882 Allergy status to sulfonamides status: Secondary | ICD-10-CM | POA: Diagnosis not present

## 2021-07-04 DIAGNOSIS — I359 Nonrheumatic aortic valve disorder, unspecified: Secondary | ICD-10-CM | POA: Diagnosis not present

## 2021-07-04 DIAGNOSIS — J1282 Pneumonia due to coronavirus disease 2019: Secondary | ICD-10-CM | POA: Diagnosis present

## 2021-07-04 DIAGNOSIS — Z79899 Other long term (current) drug therapy: Secondary | ICD-10-CM

## 2021-07-04 DIAGNOSIS — J189 Pneumonia, unspecified organism: Secondary | ICD-10-CM

## 2021-07-04 DIAGNOSIS — Z7984 Long term (current) use of oral hypoglycemic drugs: Secondary | ICD-10-CM | POA: Diagnosis not present

## 2021-07-04 DIAGNOSIS — Z8774 Personal history of (corrected) congenital malformations of heart and circulatory system: Secondary | ICD-10-CM | POA: Diagnosis not present

## 2021-07-04 DIAGNOSIS — Z7982 Long term (current) use of aspirin: Secondary | ICD-10-CM | POA: Diagnosis not present

## 2021-07-04 DIAGNOSIS — Z888 Allergy status to other drugs, medicaments and biological substances status: Secondary | ICD-10-CM

## 2021-07-04 DIAGNOSIS — I7121 Aneurysm of the ascending aorta, without rupture: Secondary | ICD-10-CM | POA: Diagnosis present

## 2021-07-04 DIAGNOSIS — G4733 Obstructive sleep apnea (adult) (pediatric): Secondary | ICD-10-CM | POA: Diagnosis present

## 2021-07-04 DIAGNOSIS — E559 Vitamin D deficiency, unspecified: Secondary | ICD-10-CM | POA: Diagnosis present

## 2021-07-04 DIAGNOSIS — U071 COVID-19: Principal | ICD-10-CM | POA: Diagnosis present

## 2021-07-04 DIAGNOSIS — R0902 Hypoxemia: Secondary | ICD-10-CM | POA: Diagnosis not present

## 2021-07-04 DIAGNOSIS — K219 Gastro-esophageal reflux disease without esophagitis: Secondary | ICD-10-CM | POA: Diagnosis present

## 2021-07-04 DIAGNOSIS — Z9049 Acquired absence of other specified parts of digestive tract: Secondary | ICD-10-CM

## 2021-07-04 DIAGNOSIS — Z794 Long term (current) use of insulin: Secondary | ICD-10-CM | POA: Diagnosis not present

## 2021-07-04 DIAGNOSIS — Z820 Family history of epilepsy and other diseases of the nervous system: Secondary | ICD-10-CM

## 2021-07-04 DIAGNOSIS — R0602 Shortness of breath: Secondary | ICD-10-CM | POA: Diagnosis not present

## 2021-07-04 DIAGNOSIS — I2510 Atherosclerotic heart disease of native coronary artery without angina pectoris: Secondary | ICD-10-CM | POA: Diagnosis not present

## 2021-07-04 DIAGNOSIS — Z833 Family history of diabetes mellitus: Secondary | ICD-10-CM

## 2021-07-04 DIAGNOSIS — Z87891 Personal history of nicotine dependence: Secondary | ICD-10-CM

## 2021-07-04 DIAGNOSIS — R0600 Dyspnea, unspecified: Secondary | ICD-10-CM | POA: Diagnosis not present

## 2021-07-04 DIAGNOSIS — Z8551 Personal history of malignant neoplasm of bladder: Secondary | ICD-10-CM | POA: Diagnosis not present

## 2021-07-04 DIAGNOSIS — E1169 Type 2 diabetes mellitus with other specified complication: Secondary | ICD-10-CM | POA: Diagnosis present

## 2021-07-04 DIAGNOSIS — E1165 Type 2 diabetes mellitus with hyperglycemia: Secondary | ICD-10-CM | POA: Diagnosis present

## 2021-07-04 DIAGNOSIS — E119 Type 2 diabetes mellitus without complications: Secondary | ICD-10-CM | POA: Diagnosis present

## 2021-07-04 DIAGNOSIS — R059 Cough, unspecified: Secondary | ICD-10-CM | POA: Diagnosis not present

## 2021-07-04 DIAGNOSIS — J9601 Acute respiratory failure with hypoxia: Secondary | ICD-10-CM | POA: Diagnosis present

## 2021-07-04 DIAGNOSIS — I10 Essential (primary) hypertension: Secondary | ICD-10-CM | POA: Diagnosis present

## 2021-07-04 DIAGNOSIS — Z87442 Personal history of urinary calculi: Secondary | ICD-10-CM

## 2021-07-04 LAB — BLOOD GAS, VENOUS
Acid-base deficit: 5.6 mmol/L — ABNORMAL HIGH (ref 0.0–2.0)
Bicarbonate: 19.6 mmol/L — ABNORMAL LOW (ref 20.0–28.0)
FIO2: 60
O2 Saturation: 78.1 %
Patient temperature: 36.8
pCO2, Ven: 34.7 mmHg — ABNORMAL LOW (ref 44.0–60.0)
pH, Ven: 7.354 (ref 7.250–7.430)
pO2, Ven: 48.2 mmHg — ABNORMAL HIGH (ref 32.0–45.0)

## 2021-07-04 LAB — TROPONIN I (HIGH SENSITIVITY)
Troponin I (High Sensitivity): 4 ng/L (ref <18)
Troponin I (High Sensitivity): 3 ng/L (ref <18)

## 2021-07-04 LAB — CBC WITH DIFFERENTIAL/PLATELET
Abs Immature Granulocytes: 0.02 10*3/uL (ref 0.00–0.07)
Basophils Absolute: 0 10*3/uL (ref 0.0–0.1)
Basophils Relative: 0 %
Eosinophils Absolute: 0.1 10*3/uL (ref 0.0–0.5)
Eosinophils Relative: 2 %
HCT: 36.1 % — ABNORMAL LOW (ref 39.0–52.0)
Hemoglobin: 11.8 g/dL — ABNORMAL LOW (ref 13.0–17.0)
Immature Granulocytes: 0 %
Lymphocytes Relative: 12 %
Lymphs Abs: 0.5 10*3/uL — ABNORMAL LOW (ref 0.7–4.0)
MCH: 33.7 pg (ref 26.0–34.0)
MCHC: 32.7 g/dL (ref 30.0–36.0)
MCV: 103.1 fL — ABNORMAL HIGH (ref 80.0–100.0)
Monocytes Absolute: 0.2 10*3/uL (ref 0.1–1.0)
Monocytes Relative: 5 %
Neutro Abs: 3.7 10*3/uL (ref 1.7–7.7)
Neutrophils Relative %: 81 %
Platelets: 245 10*3/uL (ref 150–400)
RBC: 3.5 MIL/uL — ABNORMAL LOW (ref 4.22–5.81)
RDW: 12.6 % (ref 11.5–15.5)
WBC: 4.6 10*3/uL (ref 4.0–10.5)
nRBC: 0 % (ref 0.0–0.2)

## 2021-07-04 LAB — COMPREHENSIVE METABOLIC PANEL
ALT: 27 U/L (ref 0–44)
AST: 37 U/L (ref 15–41)
Albumin: 3.4 g/dL — ABNORMAL LOW (ref 3.5–5.0)
Alkaline Phosphatase: 55 U/L (ref 38–126)
Anion gap: 14 (ref 5–15)
BUN: 57 mg/dL — ABNORMAL HIGH (ref 8–23)
CO2: 19 mmol/L — ABNORMAL LOW (ref 22–32)
Calcium: 9 mg/dL (ref 8.9–10.3)
Chloride: 110 mmol/L (ref 98–111)
Creatinine, Ser: 1.72 mg/dL — ABNORMAL HIGH (ref 0.61–1.24)
GFR, Estimated: 43 mL/min — ABNORMAL LOW (ref >60)
Glucose, Bld: 138 mg/dL — ABNORMAL HIGH (ref 70–99)
Potassium: 4.3 mmol/L (ref 3.5–5.1)
Sodium: 143 mmol/L (ref 135–145)
Total Bilirubin: 0.8 mg/dL (ref 0.3–1.2)
Total Protein: 8 g/dL (ref 6.5–8.1)

## 2021-07-04 LAB — RESP PANEL BY RT-PCR (FLU A&B, COVID) ARPGX2
Influenza A by PCR: NEGATIVE
Influenza B by PCR: NEGATIVE
SARS Coronavirus 2 by RT PCR: POSITIVE — AB

## 2021-07-04 LAB — SEDIMENTATION RATE: Sed Rate: 131 mm/hr — ABNORMAL HIGH (ref 0–16)

## 2021-07-04 LAB — PROCALCITONIN: Procalcitonin: <0.1 ng/mL

## 2021-07-04 LAB — D-DIMER, QUANTITATIVE: D-Dimer, Quant: 1.66 ug/mL-FEU — ABNORMAL HIGH (ref 0.00–0.50)

## 2021-07-04 LAB — GLUCOSE, CAPILLARY: Glucose-Capillary: 303 mg/dL — ABNORMAL HIGH (ref 70–99)

## 2021-07-04 LAB — C-REACTIVE PROTEIN: CRP: 9.6 mg/dL — ABNORMAL HIGH (ref <1.0)

## 2021-07-04 MED ORDER — IPRATROPIUM-ALBUTEROL 20-100 MCG/ACT IN AERS
1.0000 | INHALATION_SPRAY | Freq: Four times a day (QID) | RESPIRATORY_TRACT | Status: DC
  Administered 2021-07-04: 23:00:00 1 via RESPIRATORY_TRACT
  Filled 2021-07-04: qty 4

## 2021-07-04 MED ORDER — ASCORBIC ACID 500 MG PO TABS
500.0000 mg | ORAL_TABLET | Freq: Every day | ORAL | Status: DC
  Administered 2021-07-04 – 2021-07-09 (×6): 500 mg via ORAL
  Filled 2021-07-04 (×6): qty 1

## 2021-07-04 MED ORDER — SODIUM CHLORIDE 0.9 % IV BOLUS
1000.0000 mL | Freq: Once | INTRAVENOUS | Status: AC
  Administered 2021-07-04: 12:00:00 1000 mL via INTRAVENOUS

## 2021-07-04 MED ORDER — ATORVASTATIN CALCIUM 40 MG PO TABS
40.0000 mg | ORAL_TABLET | Freq: Every evening | ORAL | Status: DC
  Administered 2021-07-04 – 2021-07-08 (×5): 40 mg via ORAL
  Filled 2021-07-04 (×5): qty 1

## 2021-07-04 MED ORDER — ZINC SULFATE 220 (50 ZN) MG PO CAPS
220.0000 mg | ORAL_CAPSULE | Freq: Every day | ORAL | Status: DC
  Administered 2021-07-04 – 2021-07-09 (×6): 220 mg via ORAL
  Filled 2021-07-04 (×6): qty 1

## 2021-07-04 MED ORDER — GUAIFENESIN-DM 100-10 MG/5ML PO SYRP: 10.0000 mL | ORAL_SOLUTION | ORAL | Status: DC | PRN

## 2021-07-04 MED ORDER — INSULIN DEGLUDEC 100 UNIT/ML ~~LOC~~ SOPN: 20.0000 [IU] | PEN_INJECTOR | Freq: Every day | SUBCUTANEOUS | Status: DC

## 2021-07-04 MED ORDER — IPRATROPIUM-ALBUTEROL 0.5-2.5 (3) MG/3ML IN SOLN
3.0000 mL | Freq: Once | RESPIRATORY_TRACT | Status: AC
  Administered 2021-07-04: 12:00:00 3 mL via RESPIRATORY_TRACT
  Filled 2021-07-04: qty 3

## 2021-07-04 MED ORDER — ONDANSETRON HCL 4 MG PO TABS: 4.0000 mg | ORAL_TABLET | Freq: Four times a day (QID) | ORAL | Status: DC | PRN

## 2021-07-04 MED ORDER — SODIUM CHLORIDE 0.9 % IV SOLN
2.0000 g | Freq: Once | INTRAVENOUS | Status: AC
  Administered 2021-07-04: 15:00:00 2 g via INTRAVENOUS
  Filled 2021-07-04: qty 20

## 2021-07-04 MED ORDER — FLUTICASONE PROPIONATE 50 MCG/ACT NA SUSP: 2.0000 | Freq: Every day | NASAL | Status: DC | PRN

## 2021-07-04 MED ORDER — INSULIN ASPART 100 UNIT/ML IJ SOLN
0.0000 [IU] | Freq: Every day | INTRAMUSCULAR | Status: DC
  Administered 2021-07-04: 4 [IU] via SUBCUTANEOUS
  Administered 2021-07-05 – 2021-07-06 (×2): 2 [IU] via SUBCUTANEOUS
  Administered 2021-07-07: 3 [IU] via SUBCUTANEOUS

## 2021-07-04 MED ORDER — HYDROCOD POLST-CPM POLST ER 10-8 MG/5ML PO SUER: 5.0000 mL | Freq: Two times a day (BID) | ORAL | Status: DC | PRN

## 2021-07-04 MED ORDER — LISINOPRIL 10 MG PO TABS
20.0000 mg | ORAL_TABLET | Freq: Every day | ORAL | Status: DC
  Administered 2021-07-05 – 2021-07-09 (×5): 20 mg via ORAL
  Filled 2021-07-04 (×5): qty 2

## 2021-07-04 MED ORDER — ASPIRIN EC 81 MG PO TBEC
81.0000 mg | DELAYED_RELEASE_TABLET | Freq: Every day | ORAL | Status: DC
  Administered 2021-07-04 – 2021-07-09 (×6): 81 mg via ORAL
  Filled 2021-07-04 (×6): qty 1

## 2021-07-04 MED ORDER — TEMAZEPAM 15 MG PO CAPS
15.0000 mg | ORAL_CAPSULE | Freq: Every day | ORAL | Status: DC
  Administered 2021-07-05: 15 mg via ORAL
  Filled 2021-07-04 (×4): qty 1

## 2021-07-04 MED ORDER — ONDANSETRON HCL 4 MG/2ML IJ SOLN: 4.0000 mg | Freq: Four times a day (QID) | INTRAMUSCULAR | Status: DC | PRN

## 2021-07-04 MED ORDER — ATENOLOL 25 MG PO TABS
25.0000 mg | ORAL_TABLET | Freq: Every day | ORAL | Status: DC
  Administered 2021-07-05 – 2021-07-09 (×5): 25 mg via ORAL
  Filled 2021-07-04 (×5): qty 1

## 2021-07-04 MED ORDER — ENOXAPARIN SODIUM 40 MG/0.4ML IJ SOSY
40.0000 mg | PREFILLED_SYRINGE | INTRAMUSCULAR | Status: DC
  Administered 2021-07-04 – 2021-07-08 (×5): 40 mg via SUBCUTANEOUS
  Filled 2021-07-04 (×5): qty 0.4

## 2021-07-04 MED ORDER — DEXAMETHASONE SODIUM PHOSPHATE 10 MG/ML IJ SOLN
6.0000 mg | INTRAMUSCULAR | Status: DC
  Administered 2021-07-05 – 2021-07-06 (×2): 6 mg via INTRAVENOUS
  Filled 2021-07-04 (×2): qty 1

## 2021-07-04 MED ORDER — SODIUM CHLORIDE 0.9 % IV SOLN
500.0000 mg | INTRAVENOUS | Status: DC
  Administered 2021-07-04: 15:00:00 500 mg via INTRAVENOUS
  Filled 2021-07-04: qty 5

## 2021-07-04 MED ORDER — DEXAMETHASONE SODIUM PHOSPHATE 10 MG/ML IJ SOLN
10.0000 mg | Freq: Once | INTRAMUSCULAR | Status: AC
  Administered 2021-07-04: 12:00:00 10 mg via INTRAVENOUS
  Filled 2021-07-04: qty 1

## 2021-07-04 MED ORDER — IOHEXOL 350 MG/ML SOLN
75.0000 mL | Freq: Once | INTRAVENOUS | Status: AC | PRN
  Administered 2021-07-04: 13:00:00 75 mL via INTRAVENOUS

## 2021-07-04 MED ORDER — INSULIN ASPART 100 UNIT/ML IJ SOLN
0.0000 [IU] | Freq: Three times a day (TID) | INTRAMUSCULAR | Status: DC
  Administered 2021-07-05: 3 [IU] via SUBCUTANEOUS
  Administered 2021-07-05: 11 [IU] via SUBCUTANEOUS
  Administered 2021-07-05: 3 [IU] via SUBCUTANEOUS
  Administered 2021-07-06: 8 [IU] via SUBCUTANEOUS
  Administered 2021-07-06: 5 [IU] via SUBCUTANEOUS
  Administered 2021-07-06: 2 [IU] via SUBCUTANEOUS
  Administered 2021-07-07: 11 [IU] via SUBCUTANEOUS
  Administered 2021-07-07: 5 [IU] via SUBCUTANEOUS
  Administered 2021-07-07: 3 [IU] via SUBCUTANEOUS
  Administered 2021-07-08: 15 [IU] via SUBCUTANEOUS
  Administered 2021-07-08: 5 [IU] via SUBCUTANEOUS
  Administered 2021-07-08: 8 [IU] via SUBCUTANEOUS
  Administered 2021-07-09: 11 [IU] via SUBCUTANEOUS
  Administered 2021-07-09: 5 [IU] via SUBCUTANEOUS

## 2021-07-04 MED ORDER — INSULIN DETEMIR 100 UNIT/ML ~~LOC~~ SOLN
0.1000 [IU]/kg | Freq: Two times a day (BID) | SUBCUTANEOUS | Status: DC
  Administered 2021-07-04 – 2021-07-07 (×7): 9 [IU] via SUBCUTANEOUS
  Filled 2021-07-04 (×10): qty 0.09

## 2021-07-04 NOTE — Plan of Care (Signed)
  Problem: Education: Goal: Knowledge of General Education information will improve Description Including pain rating scale, medication(s)/side effects and non-pharmacologic comfort measures Outcome: Progressing   Problem: Health Behavior/Discharge Planning: Goal: Ability to manage health-related needs will improve Outcome: Progressing   

## 2021-07-04 NOTE — ED Notes (Signed)
Pt placed on 3L O2 via [place] by provider, sats remain 91-94% on 3lpm. Reports some relief following neb treatment

## 2021-07-04 NOTE — H&P (Signed)
History and Physical  [name] [name] GUR:[id number] DOB: 1956/04/20 DOA: 07/04/2021  Referring physician: Deno Etienne, PA-C, EDP PCP: Dettinger, Fransisca Kaufmann, MD  Outpatient Specialists:  Patient Coming From: home  Chief Complaint: SOB, COVID  HPI: [name] [name] is a 65 y.o. male with a history of coronary artery disease, aortic stenosis status post bicuspid aortic valve replacement, ascending aortic aneurysm that is stable, diabetes, OSA not on CPAP.  Patient seen for shortness of breath after positive COVID test.  He diagnosed on 12/19.  He began antiviral treatments which he completed and started to feel better.  On December 26, he started to feel worse again with fevers, chills, productive cough, shortness of breath.  He becomes more fatigued and short of breath with ambulation.  Also has pleurisy.  No other palliating or provoking factors.  Emergency Department Course: White count 4.6.  VBG showed normal pH with PCO2 of 734 and a PO2 of 48.  Patient given Rocephin and azithromycin.  Chest x-ray shows multifocal pneumonia.  PE study was negative  Review of Systems:   Pt denies any nausea, vomiting, diarrhea, constipation, abdominal pain, headache, vision changes, lightheadedness, dizziness, melena, rectal bleeding.  Review of systems are otherwise negative  Past Medical History:  Diagnosis Date   Aortic stenosis 8/09   Bicuspid aortic valve   Arthritis 6/10   hand    Atrial fibrillation (HCC)    post op only   Bile duct calculus with acute cholecystitis    CAD (coronary artery disease) 8/09   Calculus of common bile duct with acute pancreatitis 11/2016   CTS (carpal tunnel syndrome)    bilateral    Diabetes ([place])    Diastolic dysfunction 2/83   Elevated LFTs    Fatty liver disease, nonalcoholic    History of kidney stones    Hyperlipidemia    Hypertension    Kidney stones    Nasal congestion    NIDDM (non-insulin dependent diabetes mellitus)    x15 yrs   OSA on CPAP  2006   Paresthesias in left hand 02/19/2004   Paresthesias in right hand 02/19/2004   Sinusitis    Vitamin D deficiency 06/13/09   Past Surgical History:  Procedure Laterality Date   bladder cancer - operation - 01/21/18 - Dr Tresa Sabo  01/21/2018   CARPAL TUNNEL RELEASE     bilateral    CHOLECYSTECTOMY  11/2016   COLONOSCOPY N/A 09/22/2018   Procedure: COLONOSCOPY;  Surgeon: Rogene Houston, MD;  Location: [facility];  Service: Endoscopy;  Laterality: N/A;  1:00   CORONARY ANGIOPLASTY WITH STENT PLACEMENT     to rt coronary atery (Dr. Degert-cardiologist)   [place], URETEROSCOPY AND STENT PLACEMENT Left 03/07/2018   Procedure: CYSTOSCOPY WITH RETROGRADE PYELOGRAM,  AND STENT PLACEMENT;  Surgeon: [name] Frock, MD;  Location: [facility];  Service: Urology;  Laterality: Left;   ERCP N/A 11/24/2016   Procedure: ENDOSCOPIC RETROGRADE CHOLANGIOPANCREATOGRAPHY (ERCP);  Surgeon: Rogene Houston, MD;  Location: [facility];  Service: Endoscopy;  Laterality: N/A;   EXTRACORPOREAL SHOCK WAVE LITHOTRIPSY Left 03/17/2018   Procedure: LEFT EXTRACORPOREAL SHOCK WAVE LITHOTRIPSY (ESWL);  Surgeon: Irine Seal, MD;  Location: [facility];  Service: Urology;  Laterality: Left;   REMOVAL OF STONES N/A 11/24/2016   Procedure: REMOVAL OF STONES;  Surgeon: Rogene Houston, MD;  [facility]: [facility];  Service: Endoscopy;  Laterality: N/A;   SPHINCTEROTOMY N/A 11/24/2016   Procedure: SPHINCTEROTOMY;  Surgeon: Rogene Houston,  MD;  Location: [facility];  Service: Endoscopy;  Laterality: N/A;   TISSUE AORTIC VALVE REPLACEMENT     2012   Social History:  reports that he quit smoking about 19 years ago. His smoking use included cigarettes. He started smoking about 47 years ago. He has a 37.50 pack-year smoking history. He has never used smokeless tobacco. He reports that he does not drink alcohol and does not use drugs. Patient lives at home  Allergies  Allergen Reactions   Glyxambi  [Empagliflozin-Linagliptin] Diarrhea and Itching    He was able to take Jardiance alone with no side effects so it was the Tradjenta part that most likely gave him the reaction   Hctz [Hydrochlorothiazide]     Nausea and headache    Sulfa Antibiotics Nausea And Vomiting   Sulfonamide Derivatives     Family History  Problem Relation Age of Onset   Hearing loss Mother    Alzheimer's disease Mother    Hearing loss Father    Diabetes Father    Diabetes Other        Family History       Prior to Admission medications   Medication Sig Start Date End Date Taking? Authorizing Provider  aspirin EC 81 MG tablet Take 81 mg by mouth daily.   Yes [provider]  atenolol (TENORMIN) 25 MG tablet Take 1 tablet (25 mg total) by mouth daily. 05/07/21  Yes Dettinger, Fransisca Kaufmann, MD  atorvastatin (LIPITOR) 40 MG tablet Take 1 tablet (40 mg total) by mouth daily. 05/07/21  Yes Dettinger, Fransisca Kaufmann, MD  benzonatate (TESSALON) 200 MG capsule Take 1 capsule (200 mg total) by mouth 3 (three) times daily as needed. 06/23/21  Yes Hawks, Christy A, FNP  cholecalciferol (VITAMIN D) 1000 UNITS tablet Take 2,000 Units by mouth 2 (two) times daily.   Yes [provider]  fexofenadine (ALLEGRA) 180 MG tablet Take 180 mg by mouth daily as needed.   Yes [provider]  fluticasone (FLONASE) 50 MCG/ACT nasal spray Place 2 sprays into the nose daily as needed for allergies.  12/12/12  Yes Chipper Herb, MD  glipiZIDE (GLUCOTROL) 5 MG tablet Take 2 tablets (10 mg total) by mouth 2 (two) times daily before a meal. 05/07/21  Yes Dettinger, Fransisca Kaufmann, MD  insulin degludec (TRESIBA FLEXTOUCH) 100 UNIT/ML FlexTouch Pen Inject 20 Units into the skin daily.   Yes [provider]  lisinopril (ZESTRIL) 20 MG tablet Take 1 tablet (20 mg total) by mouth daily. 05/07/21  Yes Dettinger, Fransisca Kaufmann, MD  metFORMIN (GLUCOPHAGE) 1000 MG tablet Take 1 tablet (1,000 mg total) by mouth 2 (two) times daily with a  meal. 05/07/21  Yes Dettinger, Fransisca Kaufmann, MD  promethazine (PHENERGAN) 25 MG tablet Take 1 tablet (25 mg total) by mouth every 8 (eight) hours as needed for nausea or vomiting. 07/02/21  Yes Hassell Done, Mary-Margaret, FNP  promethazine-dextromethorphan (PROMETHAZINE-DM) 6.25-15 MG/5ML syrup Take 5 mLs by mouth 3 (three) times daily as needed for cough. 06/23/21  Yes Hawks, Christy A, FNP  Semaglutide (RYBELSUS) 14 MG TABS Take 14 mg by mouth daily. 02/03/21  Yes Dettinger, Fransisca Kaufmann, MD  Simethicone (GAS-X PO) Take 1 capsule by mouth daily as needed.   Yes [provider]  temazepam (RESTORIL) 15 MG capsule TAKE ONE CAPSULE BY MOUTH AT BEDTIME AS NEEDED 02/03/21  Yes Dettinger, Fransisca Kaufmann, MD  blood glucose meter kit and supplies KIT Dispense based on patient and insurance preference. Use  up to four times daily as directed. (FOR ICD-9 250.00, 250.01). 07/29/20   Dettinger, Fransisca Kaufmann, MD  Insulin Pen Needle (GLOBAL EASE INJECT PEN NEEDLES) 31G X 5 MM MISC Use daily as directed Dx E11.69 01/27/21   Dettinger, Fransisca Kaufmann, MD  Lancets [facility] DELICA PLUS [id number]) MISC 4 (four) times daily. 07/29/20   [provider]  metroNIDAZOLE (FLAGYL) 250 MG tablet Take 1 tablet (250 mg total) by mouth daily. As needed Patient not taking: Reported on 07/04/2021 03/13/21   Gabriel Rung, NP  [facility] ULTRA test strip Test BS 4 times daily Dx E11.69 11/18/20   Dettinger, Fransisca Kaufmann, MD  sodium fluoride (FLUORISHIELD) 1.1 % GEL dental gel PreviDent 5000 Dry Mouth 1.1 % dental paste Patient not taking: Reported on 07/04/2021    [provider]    Physical Exam: BP 137/74    Pulse 98    Temp 98.2 F (36.8 C) (Oral)    Resp (!) 28    Ht $R'5\' 10"'Nv$  (1.778 m)    Wt 90.7 kg    SpO2 95%    BMI 28.70 kg/m   General: elderly male. Awake and alert and oriented x3. No acute cardiopulmonary distress.  HEENT: Normocephalic atraumatic.  Right and left ears normal in appearance.  Pupils equal, round, reactive to light.  Extraocular muscles are intact. Sclerae anicteric and noninjected.  Moist mucosal membranes. No mucosal lesions.  Neck: Neck supple without lymphadenopathy. No carotid bruits. No masses palpated.  Cardiovascular: Regular rate with normal S1-S2 sounds. No murmurs, rubs, gallops auscultated. No JVD.  Respiratory: Rales throughout.  No accessory muscle use. Abdomen: Soft, nontender, nondistended. Active bowel sounds. No masses or hepatosplenomegaly  Skin: No rashes, lesions, or ulcerations.  Dry, warm to touch. 2+ dorsalis pedis and radial pulses. Musculoskeletal: No calf or leg pain. All major joints not erythematous nontender.  No upper or lower joint deformation.  Good ROM.  No contractures  Psychiatric: Intact judgment and insight. Pleasant and cooperative. Neurologic: No focal neurological deficits. Strength is 5/5 and symmetric in upper and lower extremities.  Cranial nerves II through XII are grossly intact.           Labs on Admission: I have personally reviewed following labs and imaging studies  CBC: Recent Labs  Lab 07/04/21 1140  WBC 4.6  NEUTROABS 3.7  HGB 11.8*  HCT 36.1*  MCV 103.1*  PLT 034   Basic Metabolic Panel: Recent Labs  Lab 07/04/21 1140  NA 143  K 4.3  CL 110  CO2 19*  GLUCOSE 138*  BUN 57*  CREATININE 1.72*  CALCIUM 9.0   GFR: Estimated Creatinine Clearance: 47.9 mL/min (A) (by C-G formula based on SCr of 1.72 mg/dL (H)). Liver Function Tests: Recent Labs  Lab 07/04/21 1140  AST 37  ALT 27  ALKPHOS 55  BILITOT 0.8  PROT 8.0  ALBUMIN 3.4*   No results for input(s): LIPASE, AMYLASE in the last 168 hours. No results for input(s): AMMONIA in the last 168 hours. Coagulation Profile: No results for input(s): INR, PROTIME in the last 168 hours. Cardiac Enzymes: No results for input(s): CKTOTAL, CKMB, CKMBINDEX, TROPONINI in the last 168 hours. BNP (last 3 results) No results for input(s): PROBNP in the last 8760 hours. HbA1C: No results for  input(s): HGBA1C in the last 72 hours. CBG: No results for input(s): GLUCAP in the last 168 hours. Lipid Profile: No results for input(s): CHOL, HDL, LDLCALC, TRIG, CHOLHDL, LDLDIRECT in the last 72 hours. Thyroid  Function Tests: No results for input(s): TSH, T4TOTAL, FREET4, T3FREE, THYROIDAB in the last 72 hours. Anemia Panel: No results for input(s): VITAMINB12, FOLATE, FERRITIN, TIBC, IRON, RETICCTPCT in the last 72 hours. Urine analysis:    Component Value Date/Time   COLORURINE YELLOW 08/21/2010 1732   APPEARANCEUR Clear 12/21/2018 0830   LABSPEC 1.030 08/21/2010 1732   PHURINE 5.5 08/21/2010 1732   GLUCOSEU Trace (A) 12/21/2018 0830   HGBUR NEGATIVE 08/21/2010 1732   BILIRUBINUR Negative 12/21/2018 0830   KETONESUR NEGATIVE 08/21/2010 1732   PROTEINUR Trace (A) 12/21/2018 0830   PROTEINUR NEGATIVE 08/21/2010 1732   UROBILINOGEN negative 04/25/2015 1523   UROBILINOGEN 1.0 08/21/2010 1732   NITRITE Negative 12/21/2018 0830   NITRITE NEGATIVE 08/21/2010 1732   LEUKOCYTESUR Negative 12/21/2018 0830   Sepsis Labs: $RemoveBefo'@LABRCNTIP'NlNYwdopnCB$ (procalcitonin:4,lacticidven:4) ) Recent Results (from the past 240 hour(s))  Resp Panel by RT-PCR (Flu A&B, Covid) Nasopharyngeal Swab     Status: Abnormal   Collection Time: 07/04/21  2:54 PM   Specimen: Nasopharyngeal Swab; Nasopharyngeal(NP) swabs in vial transport medium  Result Value Ref Range Status   SARS Coronavirus 2 by RT PCR POSITIVE (A) NEGATIVE Final    Comment: (NOTE) SARS-CoV-2 target nucleic acids are DETECTED.  The SARS-CoV-2 RNA is generally detectable in upper respiratory specimens during the acute phase of infection. Positive results are indicative of the presence of the identified virus, but do not rule out bacterial infection or co-infection with other pathogens not detected by the test. Clinical correlation with patient history and other diagnostic information is necessary to determine patient infection status. The expected  result is Negative.  Fact Sheet for Patients: EntrepreneurPulse.com.au  Fact Sheet for Healthcare Providers: IncredibleEmployment.be  This test is not yet approved or cleared by the [place] FDA and  has been authorized for detection and/or diagnosis of SARS-CoV-2 by FDA under an Emergency Use Authorization (EUA).  This EUA will remain in effect (meaning this test can be used) for the duration of  the COVID-19 declaration under Section 564(b)(1) of the A ct, 21 U.S.C. section 360bbb-3(b)(1), unless the authorization is terminated or revoked sooner.     Influenza A by PCR NEGATIVE NEGATIVE Final   Influenza B by PCR NEGATIVE NEGATIVE Final    Comment: (NOTE) The Xpert Xpress SARS-CoV-2/FLU/RSV plus assay is intended as an aid in the diagnosis of influenza from Nasopharyngeal swab specimens and should not be used as a sole basis for treatment. Nasal washings and aspirates are unacceptable for Xpert Xpress SARS-CoV-2/FLU/RSV testing.  Fact Sheet for Patients: EntrepreneurPulse.com.au  Fact Sheet for Healthcare Providers: IncredibleEmployment.be  This test is not yet approved or cleared by the [place] FDA and has been authorized for detection and/or diagnosis of SARS-CoV-2 by FDA under an Emergency Use Authorization (EUA). This EUA will remain in effect (meaning this test can be used) for the duration of the COVID-19 declaration under Section 564(b)(1) of the Act, 21 U.S.C. section 360bbb-3(b)(1), unless the authorization is terminated or revoked.  Performed at [facility], [address], [place],  [address]      Radiological Exams on Admission: CT Angio Chest PE W and/or Wo Contrast  Result Date: 07/04/2021 CLINICAL DATA:  Dyspnea with exertion. EXAM: CT ANGIOGRAPHY CHEST WITH CONTRAST TECHNIQUE: Multidetector CT imaging of the chest was performed using the standard protocol during  bolus administration of intravenous contrast. Multiplanar CT image reconstructions and MIPs were obtained to evaluate the vascular anatomy. CONTRAST:  54mL OMNIPAQUE IOHEXOL 350 MG/ML SOLN COMPARISON:  January 15, 2020. FINDINGS: Cardiovascular: Satisfactory opacification of the pulmonary arteries to the segmental level. No evidence of pulmonary embolism. Normal heart size. No pericardial effusion. Status post aortic valve repair. Atherosclerosis of thoracic aorta is noted without aneurysm or dissection. Mediastinum/Nodes: No enlarged mediastinal, hilar, or axillary lymph nodes. Thyroid gland, trachea, and esophagus demonstrate no significant findings. Lungs/Pleura: No pneumothorax or pleural effusion is noted. Multiple patchy airspace opacities are noted in both lower lobes and left upper lobe concerning for multifocal pneumonia. Upper Abdomen: No acute abnormality. Musculoskeletal: No chest wall abnormality. No acute or significant osseous findings. Review of the MIP images confirms the above findings. IMPRESSION: No definite evidence of pulmonary embolus. Multiple patchy airspace opacities are noted in both lungs concerning for multifocal pneumonia. Aortic Atherosclerosis (ICD10-I70.0). Electronically Signed   By: Marijo Conception M.D.   On: 07/04/2021 13:38   DG Chest Port 1 View  Result Date: 07/04/2021 CLINICAL DATA:  Shortness of breath on exertion since yesterday, COVID positive. EXAM: PORTABLE CHEST 1 VIEW COMPARISON:  February 24, 2018. FINDINGS: EKG leads project over the chest. Cardiomediastinal contours and hilar structures are stable following median sternotomy for aortic valve replacement. Airspace disease in the LEFT lower chest along the LEFT heart border and in the retrocardiac region. Lungs are otherwise clear. On limited assessment there is no acute skeletal process. IMPRESSION: Airspace disease likely in lingula and potentially LEFT lower lobe, concerning for developing pneumonia in the setting  of COVID-19 infection. Electronically Signed   By: Zetta Bills M.D.   On: 07/04/2021 12:23    EKG: Independently reviewed. Sinus rhythm.  No acute ST changes  Assessment/Plan: Principal Problem:   Acute respiratory failure with hypoxia (HCC) Active Problems:   Essential hypertension   Aortic valve disorder   Ascending aortic aneurysm   GERD (gastroesophageal reflux disease)   Type 2 diabetes mellitus with hyperlipidemia ([place])   Pneumonia due to COVID-19 virus    This patient was discussed with the ED physician, including pertinent vitals, physical exam findings, labs, and imaging.  We also discussed care given by the ED provider.  Acute respiratory failure with hypoxia secondary to COVID-19 multifocal pneumonia Admit With white count normal and multifocal pneumonia, this presentation is classic for COVID-19 pneumonia. I ordered procalcitonin, which is normal.  We will stop antibiotics Continue steroids LABA/ICS Short acting inhalers Mucolytic's Type 2 diabetes Hold oral hypoglycemics Continue with insulin GERD Protonix Hypertension Continue lisinopril Aortic valve disorder status post replacement with ascending aortic aneurysm which is stable Stable  DVT prophylaxis: Lovenox Consultants: None Code Status: Full code Family Communication: None Disposition Plan: Patient should be able to return home tomorrow   Truett Mainland, DO

## 2021-07-04 NOTE — ED Notes (Signed)
Gave pt urinal 

## 2021-07-04 NOTE — ED Notes (Signed)
PA removed non-re breather and placed patient on nasal cannula. Will monitor patients oxygen saturations.

## 2021-07-04 NOTE — ED Provider Notes (Signed)
[facility] Provider Note   CSN: [id number] Arrival date & time: 07/04/21  1042     History Chief Complaint  Patient presents with   Shortness of Breath    [name] [name] is a 65 y.o. male.  HPI  Patient with medical history including CAD, diabetes, hypertension, aortic valve replacement, presents with complaints of shortness of breath.  Patient states he was diagnosed with COVID on 12/19, he was provided with the antiviral treatment, finished the course and started to feel better but on December 26 he started to feel worse, he developed a fever, chills, productive cough, shortness of breath and pleuritic chest pain.  Patient states that becomes more fatigued with ambulation, he has no actual chest pain, he states he just has some pain when he takes in a deep breath, states he is tolerating p.o. but has not had much of an appetite, denies any unilateral leg swelling, any calf pain, has no history of DVTs or PEs currently on hormone therapy.  He was vaccinated for COVID-19, he has no significant respiratory elements.  He denies any alleviating or aggravating factors.  He has no complaints at this time.  Past Medical History:  Diagnosis Date   Aortic stenosis 8/09   Bicuspid aortic valve   Arthritis 6/10   hand    Atrial fibrillation (HCC)    post op only   Bile duct calculus with acute cholecystitis    CAD (coronary artery disease) 8/09   Calculus of common bile duct with acute pancreatitis 11/2016   CTS (carpal tunnel syndrome)    bilateral    Diabetes ([place])    Diastolic dysfunction 4/09   Elevated LFTs    Fatty liver disease, nonalcoholic    History of kidney stones    Hyperlipidemia    Hypertension    Kidney stones    Nasal congestion    NIDDM (non-insulin dependent diabetes mellitus)    x15 yrs   OSA on CPAP 2006   Paresthesias in left hand 02/19/2004   Paresthesias in right hand 02/19/2004   Sinusitis    Vitamin D deficiency 06/13/09    Patient  Active Problem List   Diagnosis Date Noted   Sensorineural hearing loss (SNHL), bilateral 02/08/2020   Tinnitus of both ears 02/08/2020   Irritable bowel syndrome 03/09/2019   Common bile duct stone 11/24/2016   Pulmonary nodules 11/18/2016   Type 2 diabetes mellitus with hyperlipidemia ([place]) 12/04/2015   GERD (gastroesophageal reflux disease) 03/08/2014   NAFLD (nonalcoholic fatty liver disease) 03/08/2014   Ascending aortic aneurysm 12/12/2012   INSOMNIA 09/15/2010   Atrial fibrillation, chronic ([place]) 09/15/2010   CAD (coronary artery disease) 07/09/2009   AORTIC VALVE DISORDERS 05/27/2009   Hyperlipidemia with target LDL less than 70 05/24/2009   Essential hypertension 05/24/2009   UNSPECIFIED SLEEP APNEA 05/24/2009    Past Surgical History:  Procedure Laterality Date   bladder cancer - operation - 01/21/18 - Dr Tresa Oakland  01/21/2018   CARPAL TUNNEL RELEASE     bilateral    CHOLECYSTECTOMY  11/2016   COLONOSCOPY N/A 09/22/2018   Procedure: COLONOSCOPY;  Surgeon: Rogene Houston, MD;  [facility];  Service: Endoscopy;  Laterality: N/A;  1:00   CORONARY ANGIOPLASTY WITH STENT PLACEMENT     to rt coronary atery (Dr. Degert-cardiologist)   [place], URETEROSCOPY AND STENT PLACEMENT Left 03/07/2018   Procedure: CYSTOSCOPY WITH RETROGRADE PYELOGRAM,  AND STENT PLACEMENT;  Surgeon: [name] Frock, MD;  Location: [facility];  Service: Urology;  Laterality: Left;   ERCP N/A 11/24/2016   Procedure: ENDOSCOPIC RETROGRADE CHOLANGIOPANCREATOGRAPHY (ERCP);  Surgeon: Rogene Houston, MD;  [facility]: [facility];  Service: Endoscopy;  Laterality: N/A;   EXTRACORPOREAL SHOCK WAVE LITHOTRIPSY Left 03/17/2018   Procedure: LEFT EXTRACORPOREAL SHOCK WAVE LITHOTRIPSY (ESWL);  Surgeon: Irine Seal, MD;  Location: [facility];  Service: Urology;  Laterality: Left;   REMOVAL OF STONES N/A 11/24/2016   Procedure: REMOVAL OF STONES;  Surgeon: Rogene Houston, MD;  [facility]: [facility];  Service: Endoscopy;  Laterality: N/A;   SPHINCTEROTOMY N/A 11/24/2016   Procedure: SPHINCTEROTOMY;  Surgeon: Rogene Houston, MD;  [facility];  Service: Endoscopy;  Laterality: N/A;   TISSUE AORTIC VALVE REPLACEMENT     2012       Family History  Problem Relation Age of Onset   Hearing loss Mother    Alzheimer's disease Mother    Hearing loss Father    Diabetes Father    Diabetes Other        Family History     Social History   Tobacco Use   Smoking status: Former    Packs/day: 1.50    Years: 25.00    Pack years: 37.50    Types: Cigarettes    Start date: 07/06/1974    Quit date: 07/06/2002    Years since quitting: 19.0   Smokeless tobacco: Never  Vaping Use   Vaping Use: Never used  Substance Use Topics   Alcohol use: No    Alcohol/week: 0.0 standard drinks   Drug use: No    Home Medications Prior to Admission medications   Medication Sig Start Date End Date Taking? Authorizing Provider  aspirin EC 81 MG tablet Take 81 mg by mouth daily.   Yes [provider]  atenolol (TENORMIN) 25 MG tablet Take 1 tablet (25 mg total) by mouth daily. 05/07/21  Yes Dettinger, Fransisca Kaufmann, MD  atorvastatin (LIPITOR) 40 MG tablet Take 1 tablet (40 mg total) by mouth daily. 05/07/21  Yes Dettinger, Fransisca Kaufmann, MD  benzonatate (TESSALON) 200 MG capsule Take 1 capsule (200 mg total) by mouth 3 (three) times daily as needed. 06/23/21  Yes Hawks, Christy A, FNP  cholecalciferol (VITAMIN D) 1000 UNITS tablet Take 2,000 Units by mouth 2 (two) times daily.   Yes [provider]  fexofenadine (ALLEGRA) 180 MG tablet Take 180 mg by mouth daily as needed.   Yes [provider]  fluticasone (FLONASE) 50 MCG/ACT nasal spray Place 2 sprays into the nose daily as needed for allergies.  12/12/12  Yes Chipper Herb, MD  glipiZIDE (GLUCOTROL) 5 MG tablet Take 2 tablets (10 mg total) by mouth 2 (two) times daily before a meal. 05/07/21  Yes Dettinger, Fransisca Kaufmann, MD   insulin degludec (TRESIBA FLEXTOUCH) 100 UNIT/ML FlexTouch Pen Inject 20 Units into the skin daily.   Yes [provider]  lisinopril (ZESTRIL) 20 MG tablet Take 1 tablet (20 mg total) by mouth daily. 05/07/21  Yes Dettinger, Fransisca Kaufmann, MD  metFORMIN (GLUCOPHAGE) 1000 MG tablet Take 1 tablet (1,000 mg total) by mouth 2 (two) times daily with a meal. 05/07/21  Yes Dettinger, Fransisca Kaufmann, MD  promethazine (PHENERGAN) 25 MG tablet Take 1 tablet (25 mg total) by mouth every 8 (eight) hours as needed for nausea or vomiting. 07/02/21  Yes Hassell Done, Mary-Margaret, FNP  promethazine-dextromethorphan (PROMETHAZINE-DM) 6.25-15 MG/5ML syrup Take 5 mLs by mouth 3 (three) times  daily as needed for cough. 06/23/21  Yes Hawks, Christy A, FNP  Semaglutide (RYBELSUS) 14 MG TABS Take 14 mg by mouth daily. 02/03/21  Yes Dettinger, Fransisca Kaufmann, MD  Simethicone (GAS-X PO) Take 1 capsule by mouth daily as needed.   Yes [provider]  temazepam (RESTORIL) 15 MG capsule TAKE ONE CAPSULE BY MOUTH AT BEDTIME AS NEEDED 02/03/21  Yes Dettinger, Fransisca Kaufmann, MD  blood glucose meter kit and supplies KIT Dispense based on patient and insurance preference. Use up to four times daily as directed. (FOR ICD-9 250.00, 250.01). 07/29/20   Dettinger, Fransisca Kaufmann, MD  Insulin Pen Needle (GLOBAL EASE INJECT PEN NEEDLES) 31G X 5 MM MISC Use daily as directed Dx E11.69 01/27/21   Dettinger, Fransisca Kaufmann, MD  Lancets [facility] DELICA PLUS [id number]) MISC 4 (four) times daily. 07/29/20   [provider]  metroNIDAZOLE (FLAGYL) 250 MG tablet Take 1 tablet (250 mg total) by mouth daily. As needed Patient not taking: Reported on 07/04/2021 03/13/21   Gabriel Rung, NP  [facility] ULTRA test strip Test BS 4 times daily Dx E11.69 11/18/20   Dettinger, Fransisca Kaufmann, MD  sodium fluoride (FLUORISHIELD) 1.1 % GEL dental gel PreviDent 5000 Dry Mouth 1.1 % dental paste Patient not taking: Reported on 07/04/2021    [provider]    Allergies     Glyxambi [empagliflozin-linagliptin], Hctz [hydrochlorothiazide], Sulfa antibiotics, and Sulfonamide derivatives  Review of Systems   Review of Systems  Constitutional:  Positive for appetite change and fatigue. Negative for chills and fever.  HENT:  Positive for congestion. Negative for sore throat.   Respiratory:  Positive for cough. Negative for shortness of breath.   Cardiovascular:  Positive for chest pain.  Gastrointestinal:  Negative for abdominal pain, diarrhea, nausea and vomiting.  Genitourinary:  Negative for enuresis.  Musculoskeletal:  Negative for back pain.  Skin:  Negative for rash.  Neurological:  Negative for headaches.  Hematological:  Does not bruise/bleed easily.   Physical Exam Updated Vital Signs BP 123/70    Pulse 92    Temp 98.2 F (36.8 C) (Oral)    Resp (!) 32    Ht $R'5\' 10"'an$  (1.778 m)    Wt 90.7 kg    SpO2 96%    BMI 28.70 kg/m   Physical Exam Vitals and nursing note reviewed.  Constitutional:      General: He is not in acute distress.    Appearance: He is ill-appearing.     Comments: Patient is ill-appearing but alert, no obvious signs of acute distress.  Vital signs noted for tachypnea.  HENT:     Head: Normocephalic and atraumatic.     Nose: No congestion.     Mouth/Throat:     Mouth: Mucous membranes are dry.     Pharynx: Oropharynx is clear.  Eyes:     Extraocular Movements: Extraocular movements intact.     Conjunctiva/sclera: Conjunctivae normal.  Cardiovascular:     Rate and Rhythm: Normal rate and regular rhythm.     Pulses: Normal pulses.     Heart sounds: No murmur heard.   No friction rub. No gallop.  Pulmonary:     Comments: On evaluation patient had a nonrebreather on, at [facility] percent room air tachypneic, nonrebreather was removed and O2 sats dipped down to the low 80s, nasal cannula was placed on at 3 L remains in the mid to low 90s, able to speak in full sentences, no acute respiratory stress present.  Lung  sounds were assessed  tight sounding with slight bibasilar rales heard bilaterally no rhonchi or wheezing present. Abdominal:     Palpations: Abdomen is soft.     Tenderness: There is no abdominal tenderness. There is no right CVA tenderness or left CVA tenderness.  Musculoskeletal:     Right lower leg: No edema.     Left lower leg: No edema.  Skin:    General: Skin is warm and dry.  Neurological:     Mental Status: He is alert.  Psychiatric:        Mood and Affect: Mood normal.    ED Results / Procedures / Treatments   Labs (all labs ordered are listed, but only abnormal results are displayed) Labs Reviewed  RESP PANEL BY RT-PCR (FLU A&B, COVID) ARPGX2 - Abnormal; Notable for the following components:      Result Value   SARS Coronavirus 2 by RT PCR POSITIVE (*)    All other components within normal limits  COMPREHENSIVE METABOLIC PANEL - Abnormal; Notable for the following components:   CO2 19 (*)    Glucose, Bld 138 (*)    BUN 57 (*)    Creatinine, Ser 1.72 (*)    Albumin 3.4 (*)    GFR, Estimated 43 (*)    All other components within normal limits  CBC WITH DIFFERENTIAL/PLATELET - Abnormal; Notable for the following components:   RBC 3.50 (*)    Hemoglobin 11.8 (*)    HCT 36.1 (*)    MCV 103.1 (*)    Lymphs Abs 0.5 (*)    All other components within normal limits  BLOOD GAS, VENOUS - Abnormal; Notable for the following components:   pCO2, Ven 34.7 (*)    pO2, Ven 48.2 (*)    Bicarbonate 19.6 (*)    Acid-base deficit 5.6 (*)    All other components within normal limits  D-DIMER, QUANTITATIVE - Abnormal; Notable for the following components:   D-Dimer, Quant 1.66 (*)    All other components within normal limits  PROCALCITONIN  C-REACTIVE PROTEIN  SEDIMENTATION RATE  TROPONIN I (HIGH SENSITIVITY)  TROPONIN I (HIGH SENSITIVITY)    EKG EKG Interpretation  Date/Time:  Friday July 04 2021 10:52:22 EST Ventricular Rate:  90 PR Interval:  167 QRS Duration: 91 QT  Interval:  352 QTC Calculation: 431 R Axis:   -3 Text Interpretation: Sinus rhythm Confirmed by Campbell Stall ([id number]) on 85/08/7739 11:04:04 AM  Radiology CT Angio Chest PE W and/or Wo Contrast  Result Date: 07/04/2021 CLINICAL DATA:  Dyspnea with exertion. EXAM: CT ANGIOGRAPHY CHEST WITH CONTRAST TECHNIQUE: Multidetector CT imaging of the chest was performed using the standard protocol during bolus administration of intravenous contrast. Multiplanar CT image reconstructions and MIPs were obtained to evaluate the vascular anatomy. CONTRAST:  41mL OMNIPAQUE IOHEXOL 350 MG/ML SOLN COMPARISON:  January 15, 2020. FINDINGS: Cardiovascular: Satisfactory opacification of the pulmonary arteries to the segmental level. No evidence of pulmonary embolism. Normal heart size. No pericardial effusion. Status post aortic valve repair. Atherosclerosis of thoracic aorta is noted without aneurysm or dissection. Mediastinum/Nodes: No enlarged mediastinal, hilar, or axillary lymph nodes. Thyroid gland, trachea, and esophagus demonstrate no significant findings. Lungs/Pleura: No pneumothorax or pleural effusion is noted. Multiple patchy airspace opacities are noted in both lower lobes and left upper lobe concerning for multifocal pneumonia. Upper Abdomen: No acute abnormality. Musculoskeletal: No chest wall abnormality. No acute or significant osseous findings. Review of the MIP images confirms the above findings. IMPRESSION: No definite evidence  of pulmonary embolus. Multiple patchy airspace opacities are noted in both lungs concerning for multifocal pneumonia. Aortic Atherosclerosis (ICD10-I70.0). Electronically Signed   By: Marijo Conception M.D.   On: 07/04/2021 13:38   DG Chest Port 1 View  Result Date: 07/04/2021 CLINICAL DATA:  Shortness of breath on exertion since yesterday, COVID positive. EXAM: PORTABLE CHEST 1 VIEW COMPARISON:  February 24, 2018. FINDINGS: EKG leads project over the chest. Cardiomediastinal contours and  hilar structures are stable following median sternotomy for aortic valve replacement. Airspace disease in the LEFT lower chest along the LEFT heart border and in the retrocardiac region. Lungs are otherwise clear. On limited assessment there is no acute skeletal process. IMPRESSION: Airspace disease likely in lingula and potentially LEFT lower lobe, concerning for developing pneumonia in the setting of COVID-19 infection. Electronically Signed   By: Zetta Bills M.D.   On: 07/04/2021 12:23    Procedures .Critical Care Performed by: Marcello Fennel, PA-C Authorized by: Marcello Fennel, PA-C   Critical care provider statement:    Critical care time (minutes):  30   Critical care time was exclusive of:  Separately billable procedures and treating other patients   Critical care was necessary to treat or prevent imminent or life-threatening deterioration of the following conditions:  Respiratory failure   Critical care was time spent personally by me on the following activities:  Discussions with consultants, evaluation of patient's response to treatment, examination of patient, ordering and review of laboratory studies, ordering and review of radiographic studies, ordering and performing treatments and interventions, pulse oximetry, re-evaluation of patient's condition and review of old charts   I assumed direction of critical care for this patient from another provider in my specialty: no     Care discussed with: admitting provider     Medications Ordered in ED Medications  azithromycin (ZITHROMAX) 500 mg in sodium chloride 0.9 % 250 mL IVPB (0 mg Intravenous Stopped 07/04/21 1616)  dexamethasone (DECADRON) injection 10 mg (10 mg Intravenous Given 07/04/21 1224)  sodium chloride 0.9 % bolus 1,000 mL (1,000 mLs Intravenous Bolus 07/04/21 1223)  ipratropium-albuterol (DUONEB) 0.5-2.5 (3) MG/3ML nebulizer solution 3 mL (3 mLs Nebulization Given 07/04/21 1201)  iohexol (OMNIPAQUE) 350 MG/ML  injection 75 mL (75 mLs Intravenous Contrast Given 07/04/21 1316)  cefTRIAXone (ROCEPHIN) 2 g in sodium chloride 0.9 % 100 mL IVPB (0 g Intravenous Stopped 07/04/21 1616)    ED Course  I have reviewed the triage vital signs and the nursing notes.  Pertinent labs & imaging results that were available during my care of the patient were reviewed by me and considered in my medical decision making (see chart for details).    MDM Rules/Calculators/A&P                         Initial impression-presents with shortness of breath, he is alert, and ill-appearing, vital signs notable for tachypnea, likely suffering from post COVID I am concerned for possible PE will obtain basic lab work-up, obtain CT of chest provide him with steroids, nebulizer treatment and reassess.  Work-up-CBC shows macro anemia hemoglobin of 11.8, CMP shows CO2 of 19, glucose 138, BUN of 57, creatinine 1.7, albumin 3.4, VBG shows PCO2 of 34, bicarb of 19.6.  Negative delta troponin.  Chest x-ray shows airspace disease, concerning for developing pneumonia.  CT of chest reveals multi patchy airspace opacities normal in both lungs concerning for multifocal pneumonia.  Reassessment-patient was reassessed after steroids, nebulizer  treatment, states he is feeling much better, he remains in the mid 90s while on 3 L via nasal cannula.  CT of chest ruled out PE, likely patient suffering from multifocal pneumonia, will recommend admission.  Patient agreed this plan will consult with hospitalist team.  Consult spoke with Dr. Nehemiah Settle who will admit the patient  Rule out- I have low suspicion for ACS as history is atypical,  EKG was sinus rhythm without signs of ischemia, patient had a delta troponin.  Low suspicion for PE CT of chest is negative for acute findings.  Low suspicion for AAA or aortic dissection as history is atypical, patient has low risk factors.  Low suspicion for systemic infection as patient is nontoxic-appearing, vital signs  reassuring, no obvious source infection noted on exam.   Plan-admission  Acute hypoxia-likely secondary due to multifocal pneumonia, started on IV antibiotics, patient need further evaluation.     Final Clinical Impression(s) / ED Diagnoses Final diagnoses:  Community acquired pneumonia, unspecified laterality  Acute respiratory failure with hypoxia [facility])    Rx / DC Orders ED Discharge Orders     None        Marcello Fennel, PA-C 97/58/83 [id number]    Lianne Cure, DO 82/64/15 [id number]

## 2021-07-04 NOTE — ED Triage Notes (Signed)
Pt brought in by [facility] from home with c/o SOB, especially with exertion since yesterday. Covid positive since Christmas Eve. O2 sat 88% on RA, [place] applied at 2L via O2 sat increase to only 92% so pt was placed on NRB by EMS. Pt also c/o mid chest pain since yesterday.

## 2021-07-04 NOTE — Plan of Care (Signed)
  Problem: Education: Goal: Knowledge of General Education information will improve Description: Including pain rating scale, medication(s)/side effects and non-pharmacologic comfort measures Outcome: Progressing   Problem: Education: Goal: Knowledge of risk factors and measures for prevention of condition will improve Outcome: Progressing   Problem: Coping: Goal: Psychosocial and spiritual needs will be supported Outcome: Progressing   

## 2021-07-05 DIAGNOSIS — E785 Hyperlipidemia, unspecified: Secondary | ICD-10-CM

## 2021-07-05 DIAGNOSIS — J9601 Acute respiratory failure with hypoxia: Secondary | ICD-10-CM

## 2021-07-05 DIAGNOSIS — E1169 Type 2 diabetes mellitus with other specified complication: Secondary | ICD-10-CM

## 2021-07-05 LAB — CBC WITH DIFFERENTIAL/PLATELET
Abs Immature Granulocytes: 0.01 10*3/uL (ref 0.00–0.07)
Basophils Absolute: 0 10*3/uL (ref 0.0–0.1)
Basophils Relative: 0 %
Eosinophils Absolute: 0 10*3/uL (ref 0.0–0.5)
Eosinophils Relative: 0 %
HCT: 30.9 % — ABNORMAL LOW (ref 39.0–52.0)
Hemoglobin: 10.4 g/dL — ABNORMAL LOW (ref 13.0–17.0)
Immature Granulocytes: 0 %
Lymphocytes Relative: 12 %
Lymphs Abs: 0.4 10*3/uL — ABNORMAL LOW (ref 0.7–4.0)
MCH: 34 pg (ref 26.0–34.0)
MCHC: 33.7 g/dL (ref 30.0–36.0)
MCV: 101 fL — ABNORMAL HIGH (ref 80.0–100.0)
Monocytes Absolute: 0.3 10*3/uL (ref 0.1–1.0)
Monocytes Relative: 9 %
Neutro Abs: 2.4 10*3/uL (ref 1.7–7.7)
Neutrophils Relative %: 79 %
Platelets: 243 10*3/uL (ref 150–400)
RBC: 3.06 MIL/uL — ABNORMAL LOW (ref 4.22–5.81)
RDW: 12.3 % (ref 11.5–15.5)
WBC: 3.1 10*3/uL — ABNORMAL LOW (ref 4.0–10.5)
nRBC: 0 % (ref 0.0–0.2)

## 2021-07-05 LAB — COMPREHENSIVE METABOLIC PANEL
ALT: 26 U/L (ref 0–44)
AST: 26 U/L (ref 15–41)
Albumin: 3 g/dL — ABNORMAL LOW (ref 3.5–5.0)
Alkaline Phosphatase: 57 U/L (ref 38–126)
Anion gap: 13 (ref 5–15)
BUN: 52 mg/dL — ABNORMAL HIGH (ref 8–23)
CO2: 19 mmol/L — ABNORMAL LOW (ref 22–32)
Calcium: 8.8 mg/dL — ABNORMAL LOW (ref 8.9–10.3)
Chloride: 110 mmol/L (ref 98–111)
Creatinine, Ser: 1.54 mg/dL — ABNORMAL HIGH (ref 0.61–1.24)
GFR, Estimated: 49 mL/min — ABNORMAL LOW (ref >60)
Glucose, Bld: 200 mg/dL — ABNORMAL HIGH (ref 70–99)
Potassium: 4.7 mmol/L (ref 3.5–5.1)
Sodium: 142 mmol/L (ref 135–145)
Total Bilirubin: 0.7 mg/dL (ref 0.3–1.2)
Total Protein: 7.2 g/dL (ref 6.5–8.1)

## 2021-07-05 LAB — HIV ANTIBODY (ROUTINE TESTING W REFLEX): HIV Screen 4th Generation wRfx: NONREACTIVE

## 2021-07-05 LAB — GLUCOSE, CAPILLARY
Glucose-Capillary: 178 mg/dL — ABNORMAL HIGH (ref 70–99)
Glucose-Capillary: 297 mg/dL — ABNORMAL HIGH (ref 70–99)
Glucose-Capillary: 336 mg/dL — ABNORMAL HIGH (ref 70–99)
Glucose-Capillary: 227 mg/dL — ABNORMAL HIGH (ref 70–99)

## 2021-07-05 LAB — C-REACTIVE PROTEIN: CRP: 5.7 mg/dL — ABNORMAL HIGH (ref <1.0)

## 2021-07-05 LAB — D-DIMER, QUANTITATIVE: D-Dimer, Quant: 1.82 ug/mL-FEU — ABNORMAL HIGH (ref 0.00–0.50)

## 2021-07-05 MED ORDER — IPRATROPIUM-ALBUTEROL 20-100 MCG/ACT IN AERS
1.0000 | INHALATION_SPRAY | Freq: Four times a day (QID) | RESPIRATORY_TRACT | Status: DC
  Administered 2021-07-05 – 2021-07-06 (×7): 1 via RESPIRATORY_TRACT

## 2021-07-05 MED ORDER — SODIUM CHLORIDE 0.9 % IV SOLN
100.0000 mg | INTRAVENOUS | Status: AC
  Administered 2021-07-05 (×2): 100 mg via INTRAVENOUS
  Filled 2021-07-05 (×2): qty 20

## 2021-07-05 NOTE — Progress Notes (Signed)
PROGRESS NOTE    [name] [name]  WUJ:[id number] DOB: April 01, 1956 DOA: 07/04/2021 PCP: Dettinger, [name], MD   Brief Narrative:   [name] [name] is a 65 y.o. male with a history of coronary artery disease, aortic stenosis status post bicuspid aortic valve replacement, ascending aortic aneurysm that is stable, diabetes, OSA not on CPAP.  Patient seen for shortness of breath after positive COVID test.  He has been admitted for acute hypoxemic respiratory failure secondary to COVID-19 pneumonia.  Assessment & Plan:   Principal Problem:   Acute respiratory failure with hypoxia (HCC) Active Problems:   Essential hypertension   Aortic valve disorder   Ascending aortic aneurysm   GERD (gastroesophageal reflux disease)   Type 2 diabetes mellitus with hyperlipidemia (HCC)   Pneumonia due to COVID-19 virus   Acute hypoxemic respiratory failure secondary to COVID-19 pneumonia -Patient has had prior vaccination and booster -Start remdesivir today -Continue Decadron -Continue inhalers -Wean oxygen as tolerated  Type 2 diabetes -With some hyperglycemia -Holding oral hypoglycemics -Continue SSI  GERD -PPI  Hypertension -Continue lisinopril  Aortic valve disorder status post replacement with ascending aortic aneurysm -Stable   DVT prophylaxis: Lovenox Code Status: Full Family Communication: Patient will call wife Disposition Plan:  Status is: Inpatient  Remains inpatient appropriate because: Need for IV medications.   Consultants:  None  Procedures:  See below  Antimicrobials:  Anti-infectives (From admission, onward)    Start     Dose/Rate Route Frequency Ordered Stop   07/05/21 1100  remdesivir 100 mg in sodium chloride 0.9 % 100 mL IVPB        100 mg 200 mL/hr over 30 Minutes Intravenous Every 1 hr x 2 07/05/21 1007 07/05/21 1259   07/04/21 1430  cefTRIAXone (ROCEPHIN) 2 g in sodium chloride 0.9 % 100 mL IVPB        2 g 200 mL/hr over 30 Minutes Intravenous   Once 07/04/21 1429 07/04/21 1616   07/04/21 1430  azithromycin (ZITHROMAX) 500 mg in sodium chloride 0.9 % 250 mL IVPB  Status:  Discontinued        500 mg 250 mL/hr over 60 Minutes Intravenous Every 24 hours 07/04/21 1429 07/04/21 2055       Subjective: Patient seen and evaluated today with no new acute complaints or concerns. No acute concerns or events noted overnight.  Objective: Vitals:   07/05/21 0604 07/05/21 0803 07/05/21 0807 07/05/21 0832  BP:  (!) 143/75 (!) 143/75   Pulse:  72 73   Resp:      Temp:   97.8 F (36.6 C)   TempSrc:   Oral   SpO2:   97% 96%  Weight: 84.7 kg     Height:        Intake/Output Summary (Last 24 hours) at 07/05/2021 1059 Last data filed at 07/04/2021 1616 Gross per 24 hour  Intake 350 ml  Output --  Net 350 ml   Filed Weights   07/04/21 1046 07/05/21 0604  Weight: 90.7 kg 84.7 kg    Examination:  General exam: Appears calm and comfortable  Respiratory system: Clear to auscultation. Respiratory effort normal.  Currently on 4 L nasal cannula Cardiovascular system: S1 & S2 heard, RRR.  Gastrointestinal system: Abdomen is soft Central nervous system: Alert and awake Extremities: No edema Skin: No significant lesions noted Psychiatry: Flat affect.    Data Reviewed: I have personally reviewed following labs and imaging studies  CBC: Recent Labs  Lab 07/04/21 1140 07/05/21 0649  WBC 4.6 3.1*  NEUTROABS 3.7 2.4  HGB 11.8* 10.4*  HCT 36.1* 30.9*  MCV 103.1* 101.0*  PLT 245 243   Basic Metabolic Panel: Recent Labs  Lab 07/04/21 1140 07/05/21 0649  NA 143 142  K 4.3 4.7  CL 110 110  CO2 19* 19*  GLUCOSE 138* 200*  BUN 57* 52*  CREATININE 1.72* 1.54*  CALCIUM 9.0 8.8*   GFR: Estimated Creatinine Clearance: 48.7 mL/min (A) (by C-G formula based on SCr of 1.54 mg/dL (H)). Liver Function Tests: Recent Labs  Lab 07/04/21 1140 07/05/21 0649  AST 37 26  ALT 27 26  ALKPHOS 55 57  BILITOT 0.8 0.7  PROT 8.0 7.2   ALBUMIN 3.4* 3.0*   No results for input(s): LIPASE, AMYLASE in the last 168 hours. No results for input(s): AMMONIA in the last 168 hours. Coagulation Profile: No results for input(s): INR, PROTIME in the last 168 hours. Cardiac Enzymes: No results for input(s): CKTOTAL, CKMB, CKMBINDEX, TROPONINI in the last 168 hours. BNP (last 3 results) No results for input(s): PROBNP in the last 8760 hours. HbA1C: No results for input(s): HGBA1C in the last 72 hours. CBG: Recent Labs  Lab 07/04/21 2136 07/05/21 0709  GLUCAP 303* 178*   Lipid Profile: No results for input(s): CHOL, HDL, LDLCALC, TRIG, CHOLHDL, LDLDIRECT in the last 72 hours. Thyroid Function Tests: No results for input(s): TSH, T4TOTAL, FREET4, T3FREE, THYROIDAB in the last 72 hours. Anemia Panel: No results for input(s): VITAMINB12, FOLATE, FERRITIN, TIBC, IRON, RETICCTPCT in the last 72 hours. Sepsis Labs: Recent Labs  Lab 07/04/21 1615  PROCALCITON <0.10    Recent Results (from the past 240 hour(s))  Resp Panel by RT-PCR (Flu A&B, Covid) Nasopharyngeal Swab     Status: Abnormal   Collection Time: 07/04/21  2:54 PM   Specimen: Nasopharyngeal Swab; Nasopharyngeal(NP) swabs in vial transport medium  Result Value Ref Range Status   SARS Coronavirus 2 by RT PCR POSITIVE (A) NEGATIVE Final    Comment: (NOTE) SARS-CoV-2 target nucleic acids are DETECTED.  The SARS-CoV-2 RNA is generally detectable in upper respiratory specimens during the acute phase of infection. Positive results are indicative of the presence of the identified virus, but do not rule out bacterial infection or co-infection with other pathogens not detected by the test. Clinical correlation with patient history and other diagnostic information is necessary to determine patient infection status. The expected result is Negative.  Fact Sheet for Patients: BloggerCourse.com  Fact Sheet for Healthcare  Providers: SeriousBroker.it  This test is not yet approved or cleared by the [place] FDA and  has been authorized for detection and/or diagnosis of SARS-CoV-2 by FDA under an Emergency Use Authorization (EUA).  This EUA will remain in effect (meaning this test can be used) for the duration of  the COVID-19 declaration under Section 564(b)(1) of the A ct, 21 U.S.C. section 360bbb-3(b)(1), unless the authorization is terminated or revoked sooner.     Influenza A by PCR NEGATIVE NEGATIVE Final   Influenza B by PCR NEGATIVE NEGATIVE Final    Comment: (NOTE) The Xpert Xpress SARS-CoV-2/FLU/RSV plus assay is intended as an aid in the diagnosis of influenza from Nasopharyngeal swab specimens and should not be used as a sole basis for treatment. Nasal washings and aspirates are unacceptable for Xpert Xpress SARS-CoV-2/FLU/RSV testing.  Fact Sheet for Patients: BloggerCourse.com  Fact Sheet for Healthcare Providers: SeriousBroker.it  This test is not yet approved or cleared by the [place] FDA and has been  authorized for detection and/or diagnosis of SARS-CoV-2 by FDA under an Emergency Use Authorization (EUA). This EUA will remain in effect (meaning this test can be used) for the duration of the COVID-19 declaration under Section 564(b)(1) of the Act, 21 U.S.C. section 360bbb-3(b)(1), unless the authorization is terminated or revoked.  Performed at [facility], [address], [place], [place] [address]          Radiology Studies: CT Angio Chest PE W and/or Wo Contrast  Result Date: 07/04/2021 CLINICAL DATA:  Dyspnea with exertion. EXAM: CT ANGIOGRAPHY CHEST WITH CONTRAST TECHNIQUE: Multidetector CT imaging of the chest was performed using the standard protocol during bolus administration of intravenous contrast. Multiplanar CT image reconstructions and MIPs were obtained to evaluate the  vascular anatomy. CONTRAST:  75mL OMNIPAQUE IOHEXOL 350 MG/ML SOLN COMPARISON:  January 15, 2020. FINDINGS: Cardiovascular: Satisfactory opacification of the pulmonary arteries to the segmental level. No evidence of pulmonary embolism. Normal heart size. No pericardial effusion. Status post aortic valve repair. Atherosclerosis of thoracic aorta is noted without aneurysm or dissection. Mediastinum/Nodes: No enlarged mediastinal, hilar, or axillary lymph nodes. Thyroid gland, trachea, and esophagus demonstrate no significant findings. Lungs/Pleura: No pneumothorax or pleural effusion is noted. Multiple patchy airspace opacities are noted in both lower lobes and left upper lobe concerning for multifocal pneumonia. Upper Abdomen: No acute abnormality. Musculoskeletal: No chest wall abnormality. No acute or significant osseous findings. Review of the MIP images confirms the above findings. IMPRESSION: No definite evidence of pulmonary embolus. Multiple patchy airspace opacities are noted in both lungs concerning for multifocal pneumonia. Aortic Atherosclerosis (ICD10-I70.0). Electronically Signed   By: Lupita Raider M.D.   On: 07/04/2021 13:38   DG Chest Port 1 View  Result Date: 07/04/2021 CLINICAL DATA:  Shortness of breath on exertion since yesterday, COVID positive. EXAM: PORTABLE CHEST 1 VIEW COMPARISON:  February 24, 2018. FINDINGS: EKG leads project over the chest. Cardiomediastinal contours and hilar structures are stable following median sternotomy for aortic valve replacement. Airspace disease in the LEFT lower chest along the LEFT heart border and in the retrocardiac region. Lungs are otherwise clear. On limited assessment there is no acute skeletal process. IMPRESSION: Airspace disease likely in lingula and potentially LEFT lower lobe, concerning for developing pneumonia in the setting of COVID-19 infection. Electronically Signed   By: Donzetta Kohut M.D.   On: 07/04/2021 12:23        Scheduled  Meds:  vitamin C  500 mg Oral Daily   aspirin EC  81 mg Oral Daily   atenolol  25 mg Oral Daily   atorvastatin  40 mg Oral QPM   dexamethasone (DECADRON) injection  6 mg Intravenous Q24H   enoxaparin (LOVENOX) injection  40 mg Subcutaneous Q24H   insulin aspart  0-15 Units Subcutaneous TID WC   insulin aspart  0-5 Units Subcutaneous QHS   insulin detemir  0.1 Units/kg Subcutaneous BID   Ipratropium-Albuterol  1 puff Inhalation Q6H WA   lisinopril  20 mg Oral Daily   temazepam  15 mg Oral QHS   zinc sulfate  220 mg Oral Daily   Continuous Infusions:  remdesivir 100 mg in NS 100 mL 100 mg (07/05/21 1037)     LOS: 1 day    Time spent: 35 minutes    Marteze Vecchio Hoover Brunette, DO [facility]  If 7PM-7AM, please contact night-coverage [email or web address] 07/05/2021, 10:59 AM

## 2021-07-05 NOTE — Progress Notes (Signed)
Remdesivir - Pharmacy Brief Note   O:  Patient seen for shortness of breath after positive COVID test.    A/P:  Remdesivir 200 mg IVPB once followed by 100 mg IVPB daily x 4 days.   Valrie Hart, PharmD Clinical Pharmacist  07/05/2021 11:29 AM

## 2021-07-06 LAB — COMPREHENSIVE METABOLIC PANEL
ALT: 30 U/L (ref 0–44)
AST: 30 U/L (ref 15–41)
Albumin: 3.2 g/dL — ABNORMAL LOW (ref 3.5–5.0)
Alkaline Phosphatase: 55 U/L (ref 38–126)
Anion gap: 11 (ref 5–15)
BUN: 50 mg/dL — ABNORMAL HIGH (ref 8–23)
CO2: 21 mmol/L — ABNORMAL LOW (ref 22–32)
Calcium: 9.2 mg/dL (ref 8.9–10.3)
Chloride: 110 mmol/L (ref 98–111)
Creatinine, Ser: 1.48 mg/dL — ABNORMAL HIGH (ref 0.61–1.24)
GFR, Estimated: 52 mL/min — ABNORMAL LOW (ref >60)
Glucose, Bld: 163 mg/dL — ABNORMAL HIGH (ref 70–99)
Potassium: 4.6 mmol/L (ref 3.5–5.1)
Sodium: 142 mmol/L (ref 135–145)
Total Bilirubin: 0.6 mg/dL (ref 0.3–1.2)
Total Protein: 7.5 g/dL (ref 6.5–8.1)

## 2021-07-06 LAB — C-REACTIVE PROTEIN: CRP: 3.2 mg/dL — ABNORMAL HIGH (ref <1.0)

## 2021-07-06 LAB — D-DIMER, QUANTITATIVE: D-Dimer, Quant: 1.63 ug/mL-FEU — ABNORMAL HIGH (ref 0.00–0.50)

## 2021-07-06 LAB — CBC WITH DIFFERENTIAL/PLATELET
Abs Immature Granulocytes: 0.03 10*3/uL (ref 0.00–0.07)
Basophils Absolute: 0 10*3/uL (ref 0.0–0.1)
Basophils Relative: 0 %
Eosinophils Absolute: 0 10*3/uL (ref 0.0–0.5)
Eosinophils Relative: 0 %
HCT: 34.8 % — ABNORMAL LOW (ref 39.0–52.0)
Hemoglobin: 11.4 g/dL — ABNORMAL LOW (ref 13.0–17.0)
Immature Granulocytes: 1 %
Lymphocytes Relative: 14 %
Lymphs Abs: 0.7 10*3/uL (ref 0.7–4.0)
MCH: 33.3 pg (ref 26.0–34.0)
MCHC: 32.8 g/dL (ref 30.0–36.0)
MCV: 101.8 fL — ABNORMAL HIGH (ref 80.0–100.0)
Monocytes Absolute: 0.4 10*3/uL (ref 0.1–1.0)
Monocytes Relative: 8 %
Neutro Abs: 3.7 10*3/uL (ref 1.7–7.7)
Neutrophils Relative %: 77 %
Platelets: 263 10*3/uL (ref 150–400)
RBC: 3.42 MIL/uL — ABNORMAL LOW (ref 4.22–5.81)
RDW: 12.2 % (ref 11.5–15.5)
WBC: 4.8 10*3/uL (ref 4.0–10.5)
nRBC: 0 % (ref 0.0–0.2)

## 2021-07-06 LAB — GLUCOSE, CAPILLARY
Glucose-Capillary: 140 mg/dL — ABNORMAL HIGH (ref 70–99)
Glucose-Capillary: 207 mg/dL — ABNORMAL HIGH (ref 70–99)
Glucose-Capillary: 290 mg/dL — ABNORMAL HIGH (ref 70–99)
Glucose-Capillary: 242 mg/dL — ABNORMAL HIGH (ref 70–99)

## 2021-07-06 MED ORDER — IPRATROPIUM-ALBUTEROL 20-100 MCG/ACT IN AERS
1.0000 | INHALATION_SPRAY | Freq: Three times a day (TID) | RESPIRATORY_TRACT | Status: DC
  Administered 2021-07-07: 1 via RESPIRATORY_TRACT

## 2021-07-06 MED ORDER — SODIUM CHLORIDE 0.9 % IV SOLN
100.0000 mg | Freq: Every day | INTRAVENOUS | Status: AC
  Administered 2021-07-06 – 2021-07-09 (×4): 100 mg via INTRAVENOUS
  Filled 2021-07-06 (×4): qty 20

## 2021-07-06 MED ORDER — POLYETHYLENE GLYCOL 3350 17 G PO PACK
17.0000 g | PACK | Freq: Every day | ORAL | Status: DC
  Administered 2021-07-06 – 2021-07-09 (×4): 17 g via ORAL
  Filled 2021-07-06 (×4): qty 1

## 2021-07-06 MED ORDER — METHYLPREDNISOLONE SODIUM SUCC 40 MG IJ SOLR
40.0000 mg | Freq: Two times a day (BID) | INTRAMUSCULAR | Status: DC
  Administered 2021-07-06 – 2021-07-08 (×4): 40 mg via INTRAVENOUS
  Filled 2021-07-06 (×4): qty 1

## 2021-07-06 NOTE — Progress Notes (Signed)
PROGRESS NOTE    [name] [name]  RUE:[id number] DOB: 04-07-1956 DOA: 07/04/2021 PCP: Dettinger, [name], MD   Brief Narrative:   [name] [name] is a 66 y.o. male with a history of coronary artery disease, aortic stenosis status post bicuspid aortic valve replacement, ascending aortic aneurysm that is stable, diabetes, OSA not on CPAP.  Patient seen for shortness of breath after positive COVID test.  He has been admitted for acute hypoxemic respiratory failure secondary to COVID-19 pneumonia.  Assessment & Plan:   Principal Problem:   Acute respiratory failure with hypoxia (HCC) Active Problems:   Essential hypertension   Aortic valve disorder   Ascending aortic aneurysm   GERD (gastroesophageal reflux disease)   Type 2 diabetes mellitus with hyperlipidemia (HCC)   Pneumonia due to COVID-19 virus   Acute hypoxemic respiratory failure secondary to COVID-19 pneumonia -Patient has had prior vaccination and booster -Started remdesivir 12/31 -Continue Decadron -Continue inhalers -Wean oxygen as tolerated, still on 4 L nasal cannula -Continue monitoring lab work   Type 2 diabetes -With some hyperglycemia -Holding oral hypoglycemics -Continue SSI   GERD -PPI   Hypertension -Continue lisinopril   Aortic valve disorder status post replacement with ascending aortic aneurysm -Stable     DVT prophylaxis: Lovenox Code Status: Full Family Communication: Patient will call wife Disposition Plan:  Status is: Inpatient   Remains inpatient appropriate because: Need for IV medications.     Consultants:  None   Procedures:  See below  Antimicrobials:  Anti-infectives (From admission, onward)    Start     Dose/Rate Route Frequency Ordered Stop   07/06/21 1030  remdesivir 100 mg in sodium chloride 0.9 % 100 mL IVPB        100 mg 200 mL/hr over 30 Minutes Intravenous Daily 07/06/21 0933 07/10/21 0959   07/05/21 1100  remdesivir 100 mg in sodium chloride 0.9 % 100 mL IVPB         100 mg 200 mL/hr over 30 Minutes Intravenous Every 1 hr x 2 07/05/21 1007 07/05/21 1155   07/04/21 1430  cefTRIAXone (ROCEPHIN) 2 g in sodium chloride 0.9 % 100 mL IVPB        2 g 200 mL/hr over 30 Minutes Intravenous  Once 07/04/21 1429 07/04/21 1616   07/04/21 1430  azithromycin (ZITHROMAX) 500 mg in sodium chloride 0.9 % 250 mL IVPB  Status:  Discontinued        500 mg 250 mL/hr over 60 Minutes Intravenous Every 24 hours 07/04/21 1429 07/04/21 2055      Subjective: Patient seen and evaluated today with no new acute complaints or concerns. No acute concerns or events noted overnight.  He states that he is able to eat and drink more and his appetite is improving.  He continues to remain on 4 L nasal cannula however and becomes short of breath with ambulation.  Objective: Vitals:   07/05/21 1954 07/05/21 2327 07/06/21 0747 07/06/21 0842  BP:  124/72  115/66  Pulse:  64  65  Resp:  19  18  Temp:  (!) 97.4 F (36.3 C)  98 F (36.7 C)  TempSrc:  Oral  Oral  SpO2: 90% 96% 95% 94%  Weight:      Height:        Intake/Output Summary (Last 24 hours) at 07/06/2021 0957 Last data filed at 07/05/2021 1500 Gross per 24 hour  Intake 440 ml  Output --  Net 440 ml   [employer]  07/04/21 1046 07/05/21 0604  Weight: 90.7 kg 84.7 kg    Examination:  General exam: Appears calm and comfortable  Respiratory system: Clear to auscultation. Respiratory effort normal.  4 L nasal cannula Cardiovascular system: S1 & S2 heard, RRR.  Gastrointestinal system: Abdomen is soft Central nervous system: Alert and awake Extremities: No edema Skin: No significant lesions noted Psychiatry: Flat affect.    Data Reviewed: I have personally reviewed following labs and imaging studies  CBC: Recent Labs  Lab 07/04/21 1140 07/05/21 0649 07/06/21 0537  WBC 4.6 3.1* 4.8  NEUTROABS 3.7 2.4 3.7  HGB 11.8* 10.4* 11.4*  HCT 36.1* 30.9* 34.8*  MCV 103.1* 101.0* 101.8*  PLT 245 243 263    Basic Metabolic Panel: Recent Labs  Lab 07/04/21 1140 07/05/21 0649 07/06/21 0537  NA 143 142 142  K 4.3 4.7 4.6  CL 110 110 110  CO2 19* 19* 21*  GLUCOSE 138* 200* 163*  BUN 57* 52* 50*  CREATININE 1.72* 1.54* 1.48*  CALCIUM 9.0 8.8* 9.2   GFR: Estimated Creatinine Clearance: 50.7 mL/min (A) (by C-G formula based on SCr of 1.48 mg/dL (H)). Liver Function Tests: Recent Labs  Lab 07/04/21 1140 07/05/21 0649 07/06/21 0537  AST 37 26 30  ALT 27 26 30   ALKPHOS 55 57 55  BILITOT 0.8 0.7 0.6  PROT 8.0 7.2 7.5  ALBUMIN 3.4* 3.0* 3.2*   No results for input(s): LIPASE, AMYLASE in the last 168 hours. No results for input(s): AMMONIA in the last 168 hours. Coagulation Profile: No results for input(s): INR, PROTIME in the last 168 hours. Cardiac Enzymes: No results for input(s): CKTOTAL, CKMB, CKMBINDEX, TROPONINI in the last 168 hours. BNP (last 3 results) No results for input(s): PROBNP in the last 8760 hours. HbA1C: No results for input(s): HGBA1C in the last 72 hours. CBG: Recent Labs  Lab 07/05/21 0709 07/05/21 1112 07/05/21 1624 07/05/21 2032 07/06/21 0745  GLUCAP 178* 297* 336* 227* 140*   Lipid Profile: No results for input(s): CHOL, HDL, LDLCALC, TRIG, CHOLHDL, LDLDIRECT in the last 72 hours. Thyroid Function Tests: No results for input(s): TSH, T4TOTAL, FREET4, T3FREE, THYROIDAB in the last 72 hours. Anemia Panel: No results for input(s): VITAMINB12, FOLATE, FERRITIN, TIBC, IRON, RETICCTPCT in the last 72 hours. Sepsis Labs: Recent Labs  Lab 07/04/21 1615  PROCALCITON <0.10    Recent Results (from the past 240 hour(s))  Resp Panel by RT-PCR (Flu A&B, Covid) Nasopharyngeal Swab     Status: Abnormal   Collection Time: 07/04/21  2:54 PM   Specimen: Nasopharyngeal Swab; Nasopharyngeal(NP) swabs in vial transport medium  Result Value Ref Range Status   SARS Coronavirus 2 by RT PCR POSITIVE (A) NEGATIVE Final    Comment: (NOTE) SARS-CoV-2 target  nucleic acids are DETECTED.  The SARS-CoV-2 RNA is generally detectable in upper respiratory specimens during the acute phase of infection. Positive results are indicative of the presence of the identified virus, but do not rule out bacterial infection or co-infection with other pathogens not detected by the test. Clinical correlation with patient history and other diagnostic information is necessary to determine patient infection status. The expected result is Negative.  Fact Sheet for Patients: BloggerCourse.com  Fact Sheet for Healthcare Providers: SeriousBroker.it  This test is not yet approved or cleared by the [place] FDA and  has been authorized for detection and/or diagnosis of SARS-CoV-2 by FDA under an Emergency Use Authorization (EUA).  This EUA will remain in effect (meaning this test can be  used) for the duration of  the COVID-19 declaration under Section 564(b)(1) of the A ct, 21 U.S.C. section 360bbb-3(b)(1), unless the authorization is terminated or revoked sooner.     Influenza A by PCR NEGATIVE NEGATIVE Final   Influenza B by PCR NEGATIVE NEGATIVE Final    Comment: (NOTE) The Xpert Xpress SARS-CoV-2/FLU/RSV plus assay is intended as an aid in the diagnosis of influenza from Nasopharyngeal swab specimens and should not be used as a sole basis for treatment. Nasal washings and aspirates are unacceptable for Xpert Xpress SARS-CoV-2/FLU/RSV testing.  Fact Sheet for Patients: BloggerCourse.com  Fact Sheet for Healthcare Providers: SeriousBroker.it  This test is not yet approved or cleared by the [place] FDA and has been authorized for detection and/or diagnosis of SARS-CoV-2 by FDA under an Emergency Use Authorization (EUA). This EUA will remain in effect (meaning this test can be used) for the duration of the COVID-19 declaration under Section  564(b)(1) of the Act, 21 U.S.C. section 360bbb-3(b)(1), unless the authorization is terminated or revoked.  Performed at [facility], [address] [place], [place] [address]          Radiology Studies: CT Angio Chest PE W and/or Wo Contrast  Result Date: 07/04/2021 CLINICAL DATA:  Dyspnea with exertion. EXAM: CT ANGIOGRAPHY CHEST WITH CONTRAST TECHNIQUE: Multidetector CT imaging of the chest was performed using the standard protocol during bolus administration of intravenous contrast. Multiplanar CT image reconstructions and MIPs were obtained to evaluate the vascular anatomy. CONTRAST:  75mL OMNIPAQUE IOHEXOL 350 MG/ML SOLN COMPARISON:  January 15, 2020. FINDINGS: Cardiovascular: Satisfactory opacification of the pulmonary arteries to the segmental level. No evidence of pulmonary embolism. Normal heart size. No pericardial effusion. Status post aortic valve repair. Atherosclerosis of thoracic aorta is noted without aneurysm or dissection. Mediastinum/Nodes: No enlarged mediastinal, hilar, or axillary lymph nodes. Thyroid gland, trachea, and esophagus demonstrate no significant findings. Lungs/Pleura: No pneumothorax or pleural effusion is noted. Multiple patchy airspace opacities are noted in both lower lobes and left upper lobe concerning for multifocal pneumonia. Upper Abdomen: No acute abnormality. Musculoskeletal: No chest wall abnormality. No acute or significant osseous findings. Review of the MIP images confirms the above findings. IMPRESSION: No definite evidence of pulmonary embolus. Multiple patchy airspace opacities are noted in both lungs concerning for multifocal pneumonia. Aortic Atherosclerosis (ICD10-I70.0). Electronically Signed   By: Lupita Raider M.D.   On: 07/04/2021 13:38   DG Chest Port 1 View  Result Date: 07/04/2021 CLINICAL DATA:  Shortness of breath on exertion since yesterday, COVID positive. EXAM: PORTABLE CHEST 1 VIEW COMPARISON:  February 24, 2018. FINDINGS: EKG  leads project over the chest. Cardiomediastinal contours and hilar structures are stable following median sternotomy for aortic valve replacement. Airspace disease in the LEFT lower chest along the LEFT heart border and in the retrocardiac region. Lungs are otherwise clear. On limited assessment there is no acute skeletal process. IMPRESSION: Airspace disease likely in lingula and potentially LEFT lower lobe, concerning for developing pneumonia in the setting of COVID-19 infection. Electronically Signed   By: Donzetta Kohut M.D.   On: 07/04/2021 12:23        Scheduled Meds:  vitamin C  500 mg Oral Daily   aspirin EC  81 mg Oral Daily   atenolol  25 mg Oral Daily   atorvastatin  40 mg Oral QPM   enoxaparin (LOVENOX) injection  40 mg Subcutaneous Q24H   insulin aspart  0-15 Units Subcutaneous TID WC   insulin aspart  0-5 Units Subcutaneous QHS   insulin detemir  0.1 Units/kg Subcutaneous BID   Ipratropium-Albuterol  1 puff Inhalation Q6H WA   lisinopril  20 mg Oral Daily   methylPREDNISolone (SOLU-MEDROL) injection  40 mg Intravenous Q12H   polyethylene glycol  17 g Oral Daily   temazepam  15 mg Oral QHS   zinc sulfate  220 mg Oral Daily   Continuous Infusions:  remdesivir 100 mg in NS 100 mL       LOS: 2 days    Time spent: 35 minutes    Cordelia Bessinger Hoover Brunette, DO [facility]  If 7PM-7AM, please contact night-coverage [email or web address] 07/06/2021, 9:57 AM

## 2021-07-06 NOTE — Progress Notes (Signed)
Blood sugar 242 will admin 2 units

## 2021-07-07 LAB — CBC WITH DIFFERENTIAL/PLATELET
Abs Immature Granulocytes: 0.04 10*3/uL (ref 0.00–0.07)
Basophils Absolute: 0 10*3/uL (ref 0.0–0.1)
Basophils Relative: 0 %
Eosinophils Absolute: 0 10*3/uL (ref 0.0–0.5)
Eosinophils Relative: 1 %
HCT: 35.1 % — ABNORMAL LOW (ref 39.0–52.0)
Hemoglobin: 11.7 g/dL — ABNORMAL LOW (ref 13.0–17.0)
Immature Granulocytes: 1 %
Lymphocytes Relative: 12 %
Lymphs Abs: 0.7 10*3/uL (ref 0.7–4.0)
MCH: 33.1 pg (ref 26.0–34.0)
MCHC: 33.3 g/dL (ref 30.0–36.0)
MCV: 99.4 fL (ref 80.0–100.0)
Monocytes Absolute: 0.5 10*3/uL (ref 0.1–1.0)
Monocytes Relative: 8 %
Neutro Abs: 4.4 10*3/uL (ref 1.7–7.7)
Neutrophils Relative %: 78 %
Platelets: 276 10*3/uL (ref 150–400)
RBC: 3.53 MIL/uL — ABNORMAL LOW (ref 4.22–5.81)
RDW: 12.1 % (ref 11.5–15.5)
WBC: 5.7 10*3/uL (ref 4.0–10.5)
nRBC: 0 % (ref 0.0–0.2)

## 2021-07-07 LAB — COMPREHENSIVE METABOLIC PANEL
ALT: 42 U/L (ref 0–44)
AST: 36 U/L (ref 15–41)
Albumin: 3.3 g/dL — ABNORMAL LOW (ref 3.5–5.0)
Alkaline Phosphatase: 59 U/L (ref 38–126)
Anion gap: 12 (ref 5–15)
BUN: 48 mg/dL — ABNORMAL HIGH (ref 8–23)
CO2: 19 mmol/L — ABNORMAL LOW (ref 22–32)
Calcium: 9.1 mg/dL (ref 8.9–10.3)
Chloride: 107 mmol/L (ref 98–111)
Creatinine, Ser: 1.37 mg/dL — ABNORMAL HIGH (ref 0.61–1.24)
GFR, Estimated: 57 mL/min — ABNORMAL LOW (ref >60)
Glucose, Bld: 154 mg/dL — ABNORMAL HIGH (ref 70–99)
Potassium: 4.3 mmol/L (ref 3.5–5.1)
Sodium: 138 mmol/L (ref 135–145)
Total Bilirubin: 0.5 mg/dL (ref 0.3–1.2)
Total Protein: 7.5 g/dL (ref 6.5–8.1)

## 2021-07-07 LAB — GLUCOSE, CAPILLARY
Glucose-Capillary: 159 mg/dL — ABNORMAL HIGH (ref 70–99)
Glucose-Capillary: 216 mg/dL — ABNORMAL HIGH (ref 70–99)
Glucose-Capillary: 314 mg/dL — ABNORMAL HIGH (ref 70–99)
Glucose-Capillary: 239 mg/dL — ABNORMAL HIGH (ref 70–99)

## 2021-07-07 LAB — D-DIMER, QUANTITATIVE: D-Dimer, Quant: 1.36 ug/mL-FEU — ABNORMAL HIGH (ref 0.00–0.50)

## 2021-07-07 LAB — C-REACTIVE PROTEIN: CRP: 1.7 mg/dL — ABNORMAL HIGH (ref <1.0)

## 2021-07-07 MED ORDER — IPRATROPIUM-ALBUTEROL 20-100 MCG/ACT IN AERS
1.0000 | INHALATION_SPRAY | Freq: Two times a day (BID) | RESPIRATORY_TRACT | Status: DC
  Administered 2021-07-07 – 2021-07-09 (×4): 1 via RESPIRATORY_TRACT

## 2021-07-07 NOTE — Progress Notes (Signed)
SATURATION QUALIFICATIONS: (This note is used to comply with regulatory documentation for home oxygen)  Patient Saturations on Room Air at Rest = 88%  Patient Saturations on Room Air while Ambulating = 84%  Patient Saturations on 3 Liters of oxygen while Ambulating = 95%

## 2021-07-07 NOTE — TOC Initial Note (Signed)
Transition of Care [facility]) - Initial/Assessment Note    Patient Details  Name: DAT DERKSEN MRN: [id number] Date of Birth: 06/11/1956  Transition [facility]) CM/SW Contact:    Villa Herb, LCSWA Phone Number: 07/07/2021, 3:26 PM  Clinical Narrative:                 TOC alerted that pt will need home O2 set up prior to D/C. CSW spoke with pt about about his preference in company. Pt does not have a preference. CSW gave referral to [place] with [employer] who states they will work on getting the O2 set up. Orders and sat qual note are in. TOC to follow.   Expected Discharge Plan: Home/Self Care Barriers to Discharge: Continued Medical Work up   Patient Goals and CMS Choice Patient states their goals for this hospitalization and ongoing recovery are:: Return home CMS Medicare.gov Compare Post Acute Care list provided to:: Patient Choice offered to / list presented to : Patient  Expected Discharge Plan and Services Expected Discharge Plan: Home/Self Care In-house Referral: Clinical Social Work Discharge Planning Services: CM Consult Post Acute Care Choice: Durable Medical Equipment Living arrangements for the past 2 months: Single Family Home                 DME Arranged: Oxygen DME Agency: [employer] Date DME Agency Contacted: 07/07/21   Representative spoke with at DME Agency: Morrie Sheldon            Prior Living Arrangements/Services Living arrangements for the past 2 months: Single Family Home Lives with:: Spouse Patient language and need for interpreter reviewed:: Yes Do you feel safe going back to the place where you live?: Yes      Need for Family Participation in Patient Care: Yes (Comment) Care giver support system in place?: Yes (comment)   Criminal Activity/Legal Involvement Pertinent to Current Situation/Hospitalization: No - Comment as needed  Activities of Daily Living Home Assistive Devices/Equipment: None ADL Screening (condition at time of  admission) Patient's cognitive ability adequate to safely complete daily activities?: Yes Is the patient deaf or have difficulty hearing?: No Does the patient have difficulty seeing, even when wearing glasses/contacts?: No Does the patient have difficulty concentrating, remembering, or making decisions?: No Patient able to express need for assistance with ADLs?: Yes Does the patient have difficulty dressing or bathing?: No Independently performs ADLs?: Yes (appropriate for developmental age) Does the patient have difficulty walking or climbing stairs?: No Weakness of Legs: None Weakness of Arms/Hands: None  Permission Sought/Granted                  Emotional Assessment Appearance:: Appears stated age Attitude/Demeanor/Rapport: Engaged Affect (typically observed): Accepting Orientation: : Oriented to Self, Oriented to Place, Oriented to  Time, Oriented to Situation Alcohol / Substance Use: Not Applicable Psych Involvement: No (comment)  Admission diagnosis:  Acute respiratory failure with hypoxia (HCC) [J96.01] Community acquired pneumonia, unspecified laterality [J18.9] Patient Active Problem List   Diagnosis Date Noted   Acute respiratory failure with hypoxia (HCC) 07/04/2021   Pneumonia due to COVID-19 virus 07/04/2021   Sensorineural hearing loss (SNHL), bilateral 02/08/2020   Tinnitus of both ears 02/08/2020   Irritable bowel syndrome 03/09/2019   Common bile duct stone 11/24/2016   Pulmonary nodules 11/18/2016   Type 2 diabetes mellitus with hyperlipidemia (HCC) 12/04/2015   GERD (gastroesophageal reflux disease) 03/08/2014   NAFLD (nonalcoholic fatty liver disease) 09/73/5329   Ascending aortic aneurysm 12/12/2012   INSOMNIA 09/15/2010  Atrial fibrillation, chronic (HCC) 09/15/2010   CAD (coronary artery disease) 07/09/2009   Aortic valve disorder 05/27/2009   Hyperlipidemia with target LDL less than 70 05/24/2009   Essential hypertension 05/24/2009    UNSPECIFIED SLEEP APNEA 05/24/2009   PCP:  Dettinger, [name], MD Pharmacy:   [facility] - [place], [place] - [address] [place] [address] Phone: [phone] Fax: [phone]  [facility] - [place], [place] - [address] [place] [address] Phone: [phone] Fax: [phone]  [facility] - [place], [place] - [address] [place] [address] Phone: [phone] Fax: [phone]     Social Determinants of Health (SDOH) Interventions    Readmission Risk Interventions Readmission Risk Prevention Plan 07/07/2021  Transportation Screening Complete  Home Care Screening Complete  Medication Review (RN CM) Complete  Some recent data might be hidden

## 2021-07-07 NOTE — Progress Notes (Signed)
PROGRESS NOTE    BALTHAZAR DUPELL  NWG:[id number] DOB: 11/10/1955 DOA: 07/04/2021 PCP: Dettinger, [name], MD   Brief Narrative:   [name] [name] is a 66 y.o. male with a history of coronary artery disease, aortic stenosis status post bicuspid aortic valve replacement, ascending aortic aneurysm that is stable, diabetes, OSA not on CPAP.  Patient seen for shortness of breath after positive COVID test.  He has been admitted for acute hypoxemic respiratory failure secondary to COVID-19 pneumonia.  Assessment & Plan:   Principal Problem:   Acute respiratory failure with hypoxia (HCC) Active Problems:   Essential hypertension   Aortic valve disorder   Ascending aortic aneurysm   GERD (gastroesophageal reflux disease)   Type 2 diabetes mellitus with hyperlipidemia (HCC)   Pneumonia due to COVID-19 virus   Acute hypoxemic respiratory failure secondary to COVID-19 pneumonia -Patient has had prior vaccination and booster -Started remdesivir 12/31 -Continue Decadron -Continue inhalers -Wean oxygen as tolerated, still on 4 L nasal cannula -Continue monitoring lab work   Type 2 diabetes -With some hyperglycemia -Holding oral hypoglycemics -Continue SSI   GERD -PPI   Hypertension -Continue lisinopril   Aortic valve disorder status post replacement with ascending aortic aneurysm -Stable     DVT prophylaxis: Lovenox Code Status: Full Family Communication: Patient will call wife Disposition Plan:  Status is: Inpatient   Remains inpatient appropriate because: Need for IV medications.     Consultants:  None   Procedures:  See below  Antimicrobials:  Anti-infectives (From admission, onward)    Start     Dose/Rate Route Frequency Ordered Stop   07/06/21 1030  remdesivir 100 mg in sodium chloride 0.9 % 100 mL IVPB        100 mg 200 mL/hr over 30 Minutes Intravenous Daily 07/06/21 0933 07/10/21 0959   07/05/21 1100  remdesivir 100 mg in sodium chloride 0.9 % 100 mL IVPB         100 mg 200 mL/hr over 30 Minutes Intravenous Every 1 hr x 2 07/05/21 1007 07/05/21 1155   07/04/21 1430  cefTRIAXone (ROCEPHIN) 2 g in sodium chloride 0.9 % 100 mL IVPB        2 g 200 mL/hr over 30 Minutes Intravenous  Once 07/04/21 1429 07/04/21 1616   07/04/21 1430  azithromycin (ZITHROMAX) 500 mg in sodium chloride 0.9 % 250 mL IVPB  Status:  Discontinued        500 mg 250 mL/hr over 60 Minutes Intravenous Every 24 hours 07/04/21 1429 07/04/21 2055       Subjective: Patient seen and evaluated today with no new acute complaints or concerns. No acute concerns or events noted overnight.  He still continues to have significant shortness of breath with ambulation.  He says he feels slightly better from yesterday, but overall not much improved.  Objective: Vitals:   07/06/21 2110 07/06/21 2209 07/07/21 0600 07/07/21 0731  BP:  129/72 130/76   Pulse: 64 61 64   Resp: 20 19 18    Temp:  98.2 F (36.8 C) 97.7 F (36.5 C)   TempSrc:  Oral Oral   SpO2: 97% 95% 92% 93%  Weight:      Height:        Intake/Output Summary (Last 24 hours) at 07/07/2021 1133 Last data filed at 07/06/2021 1500 Gross per 24 hour  Intake 300 ml  Output --  Net 300 ml   Filed Weights   07/04/21 1046 07/05/21 0604  Weight: 90.7 kg 84.7  kg    Examination:  General exam: Appears calm and comfortable  Respiratory system: Clear to auscultation. Respiratory effort normal.  2.5 L nasal cannula Cardiovascular system: S1 & S2 heard, RRR.  Gastrointestinal system: Abdomen is soft Central nervous system: Alert and awake Extremities: No edema Skin: No significant lesions noted Psychiatry: Flat affect.    Data Reviewed: I have personally reviewed following labs and imaging studies  CBC: Recent Labs  Lab 07/04/21 1140 07/05/21 0649 07/06/21 0537 07/07/21 0609  WBC 4.6 3.1* 4.8 5.7  NEUTROABS 3.7 2.4 3.7 4.4  HGB 11.8* 10.4* 11.4* 11.7*  HCT 36.1* 30.9* 34.8* 35.1*  MCV 103.1* 101.0* 101.8* 99.4   PLT 245 243 263 276   Basic Metabolic Panel: Recent Labs  Lab 07/04/21 1140 07/05/21 0649 07/06/21 0537 07/07/21 0609  NA 143 142 142 138  K 4.3 4.7 4.6 4.3  CL 110 110 110 107  CO2 19* 19* 21* 19*  GLUCOSE 138* 200* 163* 154*  BUN 57* 52* 50* 48*  CREATININE 1.72* 1.54* 1.48* 1.37*  CALCIUM 9.0 8.8* 9.2 9.1   GFR: Estimated Creatinine Clearance: 54.8 mL/min (A) (by C-G formula based on SCr of 1.37 mg/dL (H)). Liver Function Tests: Recent Labs  Lab 07/04/21 1140 07/05/21 0649 07/06/21 0537 07/07/21 0609  AST 37 26 30 36  ALT 27 26 30  42  ALKPHOS 55 57 55 59  BILITOT 0.8 0.7 0.6 0.5  PROT 8.0 7.2 7.5 7.5  ALBUMIN 3.4* 3.0* 3.2* 3.3*   No results for input(s): LIPASE, AMYLASE in the last 168 hours. No results for input(s): AMMONIA in the last 168 hours. Coagulation Profile: No results for input(s): INR, PROTIME in the last 168 hours. Cardiac Enzymes: No results for input(s): CKTOTAL, CKMB, CKMBINDEX, TROPONINI in the last 168 hours. BNP (last 3 results) No results for input(s): PROBNP in the last 8760 hours. HbA1C: No results for input(s): HGBA1C in the last 72 hours. CBG: Recent Labs  Lab 07/06/21 1159 07/06/21 1706 07/06/21 2209 07/07/21 0722 07/07/21 1106  GLUCAP 207* 290* 242* 159* 216*   Lipid Profile: No results for input(s): CHOL, HDL, LDLCALC, TRIG, CHOLHDL, LDLDIRECT in the last 72 hours. Thyroid Function Tests: No results for input(s): TSH, T4TOTAL, FREET4, T3FREE, THYROIDAB in the last 72 hours. Anemia Panel: No results for input(s): VITAMINB12, FOLATE, FERRITIN, TIBC, IRON, RETICCTPCT in the last 72 hours. Sepsis Labs: Recent Labs  Lab 07/04/21 1615  PROCALCITON <0.10    Recent Results (from the past 240 hour(s))  Resp Panel by RT-PCR (Flu A&B, Covid) Nasopharyngeal Swab     Status: Abnormal   Collection Time: 07/04/21  2:54 PM   Specimen: Nasopharyngeal Swab; Nasopharyngeal(NP) swabs in vial transport medium  Result Value Ref Range  Status   SARS Coronavirus 2 by RT PCR POSITIVE (A) NEGATIVE Final    Comment: (NOTE) SARS-CoV-2 target nucleic acids are DETECTED.  The SARS-CoV-2 RNA is generally detectable in upper respiratory specimens during the acute phase of infection. Positive results are indicative of the presence of the identified virus, but do not rule out bacterial infection or co-infection with other pathogens not detected by the test. Clinical correlation with patient history and other diagnostic information is necessary to determine patient infection status. The expected result is Negative.  Fact Sheet for Patients: BloggerCourse.com  Fact Sheet for Healthcare Providers: SeriousBroker.it  This test is not yet approved or cleared by the [place] FDA and  has been authorized for detection and/or diagnosis of SARS-CoV-2 by FDA under  an Emergency Use Authorization (EUA).  This EUA will remain in effect (meaning this test can be used) for the duration of  the COVID-19 declaration under Section 564(b)(1) of the A ct, 21 U.S.C. section 360bbb-3(b)(1), unless the authorization is terminated or revoked sooner.     Influenza A by PCR NEGATIVE NEGATIVE Final   Influenza B by PCR NEGATIVE NEGATIVE Final    Comment: (NOTE) The Xpert Xpress SARS-CoV-2/FLU/RSV plus assay is intended as an aid in the diagnosis of influenza from Nasopharyngeal swab specimens and should not be used as a sole basis for treatment. Nasal washings and aspirates are unacceptable for Xpert Xpress SARS-CoV-2/FLU/RSV testing.  Fact Sheet for Patients: BloggerCourse.com  Fact Sheet for Healthcare Providers: SeriousBroker.it  This test is not yet approved or cleared by the [place] FDA and has been authorized for detection and/or diagnosis of SARS-CoV-2 by FDA under an Emergency Use Authorization (EUA). This EUA will remain in  effect (meaning this test can be used) for the duration of the COVID-19 declaration under Section 564(b)(1) of the Act, 21 U.S.C. section 360bbb-3(b)(1), unless the authorization is terminated or revoked.  Performed at [facility], [address], [place], [place] [address]          Radiology Studies: No results found.      Scheduled Meds:  vitamin C  500 mg Oral Daily   aspirin EC  81 mg Oral Daily   atenolol  25 mg Oral Daily   atorvastatin  40 mg Oral QPM   enoxaparin (LOVENOX) injection  40 mg Subcutaneous Q24H   insulin aspart  0-15 Units Subcutaneous TID WC   insulin aspart  0-5 Units Subcutaneous QHS   insulin detemir  0.1 Units/kg Subcutaneous BID   Ipratropium-Albuterol  1 puff Inhalation BID   lisinopril  20 mg Oral Daily   methylPREDNISolone (SOLU-MEDROL) injection  40 mg Intravenous Q12H   polyethylene glycol  17 g Oral Daily   temazepam  15 mg Oral QHS   zinc sulfate  220 mg Oral Daily   Continuous Infusions:  remdesivir 100 mg in NS 100 mL 100 mg (07/07/21 0815)     LOS: 3 days    Time spent: 35 minutes    Kaycie Pegues Hoover Brunette, DO [facility]  If 7PM-7AM, please contact night-coverage [email or web address] 07/07/2021, 11:33 AM

## 2021-07-08 LAB — CBC WITH DIFFERENTIAL/PLATELET
Abs Immature Granulocytes: 0.06 10*3/uL (ref 0.00–0.07)
Basophils Absolute: 0 10*3/uL (ref 0.0–0.1)
Basophils Relative: 0 %
Eosinophils Absolute: 0 10*3/uL (ref 0.0–0.5)
Eosinophils Relative: 0 %
HCT: 34.9 % — ABNORMAL LOW (ref 39.0–52.0)
Hemoglobin: 11.7 g/dL — ABNORMAL LOW (ref 13.0–17.0)
Immature Granulocytes: 1 %
Lymphocytes Relative: 6 %
Lymphs Abs: 0.4 10*3/uL — ABNORMAL LOW (ref 0.7–4.0)
MCH: 32.5 pg (ref 26.0–34.0)
MCHC: 33.5 g/dL (ref 30.0–36.0)
MCV: 96.9 fL (ref 80.0–100.0)
Monocytes Absolute: 0.3 10*3/uL (ref 0.1–1.0)
Monocytes Relative: 5 %
Neutro Abs: 6.1 10*3/uL (ref 1.7–7.7)
Neutrophils Relative %: 88 %
Platelets: 289 10*3/uL (ref 150–400)
RBC: 3.6 MIL/uL — ABNORMAL LOW (ref 4.22–5.81)
RDW: 12 % (ref 11.5–15.5)
WBC: 6.9 10*3/uL (ref 4.0–10.5)
nRBC: 0 % (ref 0.0–0.2)

## 2021-07-08 LAB — D-DIMER, QUANTITATIVE: D-Dimer, Quant: 1.2 ug/mL-FEU — ABNORMAL HIGH (ref 0.00–0.50)

## 2021-07-08 LAB — COMPREHENSIVE METABOLIC PANEL
ALT: 46 U/L — ABNORMAL HIGH (ref 0–44)
AST: 30 U/L (ref 15–41)
Albumin: 3.2 g/dL — ABNORMAL LOW (ref 3.5–5.0)
Alkaline Phosphatase: 63 U/L (ref 38–126)
Anion gap: 12 (ref 5–15)
BUN: 55 mg/dL — ABNORMAL HIGH (ref 8–23)
CO2: 17 mmol/L — ABNORMAL LOW (ref 22–32)
Calcium: 9.4 mg/dL (ref 8.9–10.3)
Chloride: 107 mmol/L (ref 98–111)
Creatinine, Ser: 1.38 mg/dL — ABNORMAL HIGH (ref 0.61–1.24)
GFR, Estimated: 56 mL/min — ABNORMAL LOW (ref >60)
Glucose, Bld: 260 mg/dL — ABNORMAL HIGH (ref 70–99)
Potassium: 5 mmol/L (ref 3.5–5.1)
Sodium: 136 mmol/L (ref 135–145)
Total Bilirubin: 0.4 mg/dL (ref 0.3–1.2)
Total Protein: 7.5 g/dL (ref 6.5–8.1)

## 2021-07-08 LAB — GLUCOSE, CAPILLARY
Glucose-Capillary: 245 mg/dL — ABNORMAL HIGH (ref 70–99)
Glucose-Capillary: 355 mg/dL — ABNORMAL HIGH (ref 70–99)
Glucose-Capillary: 268 mg/dL — ABNORMAL HIGH (ref 70–99)
Glucose-Capillary: 140 mg/dL — ABNORMAL HIGH (ref 70–99)

## 2021-07-08 LAB — C-REACTIVE PROTEIN: CRP: 1.1 mg/dL — ABNORMAL HIGH (ref <1.0)

## 2021-07-08 MED ORDER — INSULIN DETEMIR 100 UNIT/ML ~~LOC~~ SOLN
12.0000 [IU] | Freq: Two times a day (BID) | SUBCUTANEOUS | Status: DC
  Administered 2021-07-08 – 2021-07-09 (×3): 12 [IU] via SUBCUTANEOUS
  Filled 2021-07-08 (×5): qty 0.12

## 2021-07-08 MED ORDER — METHYLPREDNISOLONE SODIUM SUCC 125 MG IJ SOLR
60.0000 mg | Freq: Two times a day (BID) | INTRAMUSCULAR | Status: DC
  Administered 2021-07-08 – 2021-07-09 (×2): 60 mg via INTRAVENOUS
  Filled 2021-07-08 (×2): qty 2

## 2021-07-08 MED ORDER — INSULIN ASPART 100 UNIT/ML IJ SOLN
4.0000 [IU] | Freq: Three times a day (TID) | INTRAMUSCULAR | Status: DC
  Administered 2021-07-08 – 2021-07-09 (×4): 4 [IU] via SUBCUTANEOUS

## 2021-07-08 NOTE — Progress Notes (Signed)
PROGRESS NOTE    [name] [name]  UEA:[id number] DOB: [birth year] DOA: 07/04/2021 PCP: Dettinger, [name], MD   Brief Narrative:   [name] [name] is a 66 y.o. male with a history of coronary artery disease, aortic stenosis status post bicuspid aortic valve replacement, ascending aortic aneurysm that is stable, diabetes, OSA not on CPAP.  Patient seen for shortness of breath after positive COVID test.  He has been admitted for acute hypoxemic respiratory failure secondary to COVID-19 pneumonia and continues to have ongoing symptoms of significant shortness of breath.  He is weaning down on his oxygen requirements and laboratory data is remaining stable, however.  Plan will be to increase IV Solu-Medrol to 60 mg twice daily on 1/3 to see if this improves his clinical symptoms better.  Assessment & Plan:   Principal Problem:   Acute respiratory failure with hypoxia (HCC) Active Problems:   Essential hypertension   Aortic valve disorder   Ascending aortic aneurysm   GERD (gastroesophageal reflux disease)   Type 2 diabetes mellitus with hyperlipidemia (HCC)   Pneumonia due to COVID-19 virus   Acute hypoxemic respiratory failure secondary to COVID-19 pneumonia -Continues to have some persistent symptoms requiring ongoing treatment -Patient has had prior vaccination and booster -Started remdesivir 12/31 -Continue Solu-Medrol with increased dose to 60 mg twice daily on 1/3 to see if this will assist in some of his clinical symptoms -Inflammatory marker stable -Continue inhalers -Wean oxygen as tolerated, currently on 2 L nasal cannula -Continue monitoring lab work   Type 2 diabetes -With some hyperglycemia -Increase Levemir to 12 units twice daily and added mealtime coverage on 1/3 -Holding oral hypoglycemics -Continue SSI   GERD -PPI   Hypertension -Continue lisinopril   Aortic valve disorder status post replacement with ascending aortic aneurysm -Stable     DVT prophylaxis:  Lovenox Code Status: Full Family Communication: Patient will call wife Disposition Plan:  Status is: Inpatient   Remains inpatient appropriate because: Need for IV medications.     Consultants:  None   Procedures:  See below  Antimicrobials:  Anti-infectives (From admission, onward)    Start     Dose/Rate Route Frequency Ordered Stop   07/06/21 1030  remdesivir 100 mg in sodium chloride 0.9 % 100 mL IVPB        100 mg 200 mL/hr over 30 Minutes Intravenous Daily 07/06/21 0933 07/10/21 0959   07/05/21 1100  remdesivir 100 mg in sodium chloride 0.9 % 100 mL IVPB        100 mg 200 mL/hr over 30 Minutes Intravenous Every 1 hr x 2 07/05/21 1007 07/05/21 1155   07/04/21 1430  cefTRIAXone (ROCEPHIN) 2 g in sodium chloride 0.9 % 100 mL IVPB        2 g 200 mL/hr over 30 Minutes Intravenous  Once 07/04/21 1429 07/04/21 1616   07/04/21 1430  azithromycin (ZITHROMAX) 500 mg in sodium chloride 0.9 % 250 mL IVPB  Status:  Discontinued        500 mg 250 mL/hr over 60 Minutes Intravenous Every 24 hours 07/04/21 1429 07/04/21 2055       Subjective: Patient seen and evaluated today with no reported significant improvement in his symptoms of shortness of breath, malaise, and fatigue.  He states that he feels like he is finally starting to eat a little bit better, but gets very short of breath with any form of ambulation and can hardly do tasks like to go to the bathroom or brush  his teeth.  Objective: Vitals:   07/07/21 2014 07/07/21 2033 07/08/21 0409 07/08/21 0821  BP:  131/69 136/79   Pulse: (!) 55 (!) 58 (!) 58   Resp: 20 20 20    Temp:  97.7 F (36.5 C) 97.9 F (36.6 C)   TempSrc:  Oral Oral   SpO2: 91% 95% 94% 93%  Weight:      Height:        Intake/Output Summary (Last 24 hours) at 07/08/2021 0914 Last data filed at 07/07/2021 1700 Gross per 24 hour  Intake 720 ml  Output --  Net 720 ml   Filed Weights   07/04/21 1046 07/05/21 0604  Weight: 90.7 kg 84.7 kg     Examination:  General exam: Appears calm and comfortable  Respiratory system: Clear to auscultation. Respiratory effort normal.  Currently on 2 L nasal cannula Cardiovascular system: S1 & S2 heard, RRR.  Gastrointestinal system: Abdomen is soft Central nervous system: Alert and awake Extremities: No edema Skin: No significant lesions noted Psychiatry: Flat affect.    Data Reviewed: I have personally reviewed following labs and imaging studies  CBC: Recent Labs  Lab 07/04/21 1140 07/05/21 0649 07/06/21 0537 07/07/21 0609 07/08/21 0536  WBC 4.6 3.1* 4.8 5.7 6.9  NEUTROABS 3.7 2.4 3.7 4.4 6.1  HGB 11.8* 10.4* 11.4* 11.7* 11.7*  HCT 36.1* 30.9* 34.8* 35.1* 34.9*  MCV 103.1* 101.0* 101.8* 99.4 96.9  PLT 245 243 263 276 289   Basic Metabolic Panel: Recent Labs  Lab 07/04/21 1140 07/05/21 0649 07/06/21 0537 07/07/21 0609 07/08/21 0536  NA 143 142 142 138 136  K 4.3 4.7 4.6 4.3 5.0  CL 110 110 110 107 107  CO2 19* 19* 21* 19* 17*  GLUCOSE 138* 200* 163* 154* 260*  BUN 57* 52* 50* 48* 55*  CREATININE 1.72* 1.54* 1.48* 1.37* 1.38*  CALCIUM 9.0 8.8* 9.2 9.1 9.4   GFR: Estimated Creatinine Clearance: 54.4 mL/min (A) (by C-G formula based on SCr of 1.38 mg/dL (H)). Liver Function Tests: Recent Labs  Lab 07/04/21 1140 07/05/21 0649 07/06/21 0537 07/07/21 0609 07/08/21 0536  AST 37 26 30 36 30  ALT 27 26 30  42 46*  ALKPHOS 55 57 55 59 63  BILITOT 0.8 0.7 0.6 0.5 0.4  PROT 8.0 7.2 7.5 7.5 7.5  ALBUMIN 3.4* 3.0* 3.2* 3.3* 3.2*   No results for input(s): LIPASE, AMYLASE in the last 168 hours. No results for input(s): AMMONIA in the last 168 hours. Coagulation Profile: No results for input(s): INR, PROTIME in the last 168 hours. Cardiac Enzymes: No results for input(s): CKTOTAL, CKMB, CKMBINDEX, TROPONINI in the last 168 hours. BNP (last 3 results) No results for input(s): PROBNP in the last 8760 hours. HbA1C: No results for input(s): HGBA1C in the last 72  hours. CBG: Recent Labs  Lab 07/07/21 0722 07/07/21 1106 07/07/21 1626 07/07/21 2035 07/08/21 0716  GLUCAP 159* 216* 314* 239* 245*   Lipid Profile: No results for input(s): CHOL, HDL, LDLCALC, TRIG, CHOLHDL, LDLDIRECT in the last 72 hours. Thyroid Function Tests: No results for input(s): TSH, T4TOTAL, FREET4, T3FREE, THYROIDAB in the last 72 hours. Anemia Panel: No results for input(s): VITAMINB12, FOLATE, FERRITIN, TIBC, IRON, RETICCTPCT in the last 72 hours. Sepsis Labs: Recent Labs  Lab 07/04/21 1615  PROCALCITON <0.10    Recent Results (from the past 240 hour(s))  Resp Panel by RT-PCR (Flu A&B, Covid) Nasopharyngeal Swab     Status: Abnormal   Collection Time: 07/04/21  2:54 PM  Specimen: Nasopharyngeal Swab; Nasopharyngeal(NP) swabs in vial transport medium  Result Value Ref Range Status   SARS Coronavirus 2 by RT PCR POSITIVE (A) NEGATIVE Final    Comment: (NOTE) SARS-CoV-2 target nucleic acids are DETECTED.  The SARS-CoV-2 RNA is generally detectable in upper respiratory specimens during the acute phase of infection. Positive results are indicative of the presence of the identified virus, but do not rule out bacterial infection or co-infection with other pathogens not detected by the test. Clinical correlation with patient history and other diagnostic information is necessary to determine patient infection status. The expected result is Negative.  Fact Sheet for Patients: BloggerCourse.com  Fact Sheet for Healthcare Providers: SeriousBroker.it  This test is not yet approved or cleared by the [place] FDA and  has been authorized for detection and/or diagnosis of SARS-CoV-2 by FDA under an Emergency Use Authorization (EUA).  This EUA will remain in effect (meaning this test can be used) for the duration of  the COVID-19 declaration under Section 564(b)(1) of the A ct, 21 U.S.C. section 360bbb-3(b)(1),  unless the authorization is terminated or revoked sooner.     Influenza A by PCR NEGATIVE NEGATIVE Final   Influenza B by PCR NEGATIVE NEGATIVE Final    Comment: (NOTE) The Xpert Xpress SARS-CoV-2/FLU/RSV plus assay is intended as an aid in the diagnosis of influenza from Nasopharyngeal swab specimens and should not be used as a sole basis for treatment. Nasal washings and aspirates are unacceptable for Xpert Xpress SARS-CoV-2/FLU/RSV testing.  Fact Sheet for Patients: BloggerCourse.com  Fact Sheet for Healthcare Providers: SeriousBroker.it  This test is not yet approved or cleared by the [place] FDA and has been authorized for detection and/or diagnosis of SARS-CoV-2 by FDA under an Emergency Use Authorization (EUA). This EUA will remain in effect (meaning this test can be used) for the duration of the COVID-19 declaration under Section 564(b)(1) of the Act, 21 U.S.C. section 360bbb-3(b)(1), unless the authorization is terminated or revoked.  Performed at [facility], [address], [place], [place] [address]          Radiology Studies: No results found.      Scheduled Meds:  vitamin C  500 mg Oral Daily   aspirin EC  81 mg Oral Daily   atenolol  25 mg Oral Daily   atorvastatin  40 mg Oral QPM   enoxaparin (LOVENOX) injection  40 mg Subcutaneous Q24H   insulin aspart  0-15 Units Subcutaneous TID WC   insulin aspart  0-5 Units Subcutaneous QHS   insulin aspart  4 Units Subcutaneous TID WC   insulin detemir  12 Units Subcutaneous BID   Ipratropium-Albuterol  1 puff Inhalation BID   lisinopril  20 mg Oral Daily   methylPREDNISolone (SOLU-MEDROL) injection  60 mg Intravenous Q12H   polyethylene glycol  17 g Oral Daily   temazepam  15 mg Oral QHS   zinc sulfate  220 mg Oral Daily   Continuous Infusions:  remdesivir 100 mg in NS 100 mL 100 mg (07/07/21 0815)     LOS: 4 days    Time spent: 35  minutes    Corvette Orser Hoover Brunette, DO [facility]  If 7PM-7AM, please contact night-coverage [email or web address] 07/08/2021, 9:14 AM

## 2021-07-08 NOTE — TOC Progression Note (Signed)
Transition of Care [facility]) - Progression Note    Patient Details  Name: MONTERRIO GERST MRN: [id number] Date of Birth: July 18, 1955  Transition of Care [facility]) CM/SW Contact  Karn Cassis, [place] Phone Number: 07/08/2021, 8:35 AM  Clinical Narrative: Pt confirms portable O2 has been delivered to room.       Expected Discharge Plan: Home/Self Care Barriers to Discharge: Continued Medical Work up  Expected Discharge Plan and Services Expected Discharge Plan: Home/Self Care In-house Referral: Clinical Social Work Discharge Planning Services: CM Consult Post Acute Care Choice: Durable Medical Equipment Living arrangements for the past 2 months: Single Family Home                 DME Arranged: Oxygen DME Agency: [employer] Date DME Agency Contacted: 07/07/21   Representative spoke with at DME Agency: Morrie Sheldon             Social Determinants of Health (SDOH) Interventions    Readmission Risk Interventions Readmission Risk Prevention Plan 07/07/2021  Transportation Screening Complete  Home Care Screening Complete  Medication Review (RN CM) Complete  Some recent data might be hidden

## 2021-07-08 NOTE — Progress Notes (Signed)
Inpatient Diabetes Program Recommendations  AACE/ADA: New Consensus Statement on Inpatient Glycemic Control   Target Ranges:  Prepandial:   less than 140 mg/dL      Peak postprandial:   less than 180 mg/dL (1-2 hours)      Critically ill patients:  140 - 180 mg/dL    Latest Reference Range & Units 07/08/21 07:16  Glucose-Capillary 70 - 99 mg/dL 245 (H)    Latest Reference Range & Units 07/07/21 07:22 07/07/21 11:06 07/07/21 16:26 07/07/21 20:35  Glucose-Capillary 70 - 99 mg/dL 159 (H) 216 (H) 314 (H) 239 (H)   Review of Glycemic Control  Diabetes history: DM2 Outpatient Diabetes medications: Glipizide 10 mg BID, Metformin 1000 mg BID, Rybelsus 14 mg daily, Tresiba 20 units daily Current orders for Inpatient glycemic control: Levemir 9 units BID, Novolog 0-15 units TID with meals, Novolog 0-5 units QHS; Solumedrol 40 mg Q12H  Inpatient Diabetes Program Recommendations:    Insulin: If steroids are continued as ordered, please consider increasing Levemir to 12 units BID with meals and ordering Novolog 4 units TID with meals for meal coverage if patient eats at least 50% of meals.  Thanks, Barnie Alderman, RN, MSN, CDE Diabetes Coordinator Inpatient Diabetes Program [phone] (Team Pager from 8am to 5pm)

## 2021-07-09 DIAGNOSIS — I10 Essential (primary) hypertension: Secondary | ICD-10-CM

## 2021-07-09 DIAGNOSIS — E785 Hyperlipidemia, unspecified: Secondary | ICD-10-CM

## 2021-07-09 DIAGNOSIS — E1169 Type 2 diabetes mellitus with other specified complication: Secondary | ICD-10-CM

## 2021-07-09 DIAGNOSIS — J1282 Pneumonia due to coronavirus disease 2019: Secondary | ICD-10-CM

## 2021-07-09 DIAGNOSIS — U071 COVID-19: Principal | ICD-10-CM

## 2021-07-09 DIAGNOSIS — I7121 Aneurysm of the ascending aorta, without rupture: Secondary | ICD-10-CM

## 2021-07-09 DIAGNOSIS — K219 Gastro-esophageal reflux disease without esophagitis: Secondary | ICD-10-CM

## 2021-07-09 LAB — D-DIMER, QUANTITATIVE: D-Dimer, Quant: 1.21 ug/mL-FEU — ABNORMAL HIGH (ref 0.00–0.50)

## 2021-07-09 LAB — COMPREHENSIVE METABOLIC PANEL
ALT: 47 U/L — ABNORMAL HIGH (ref 0–44)
AST: 28 U/L (ref 15–41)
Albumin: 3.4 g/dL — ABNORMAL LOW (ref 3.5–5.0)
Alkaline Phosphatase: 64 U/L (ref 38–126)
Anion gap: 11 (ref 5–15)
BUN: 65 mg/dL — ABNORMAL HIGH (ref 8–23)
CO2: 18 mmol/L — ABNORMAL LOW (ref 22–32)
Calcium: 9.3 mg/dL (ref 8.9–10.3)
Chloride: 107 mmol/L (ref 98–111)
Creatinine, Ser: 1.59 mg/dL — ABNORMAL HIGH (ref 0.61–1.24)
GFR, Estimated: 48 mL/min — ABNORMAL LOW (ref >60)
Glucose, Bld: 213 mg/dL — ABNORMAL HIGH (ref 70–99)
Potassium: 5 mmol/L (ref 3.5–5.1)
Sodium: 136 mmol/L (ref 135–145)
Total Bilirubin: 0.7 mg/dL (ref 0.3–1.2)
Total Protein: 7.3 g/dL (ref 6.5–8.1)

## 2021-07-09 LAB — CBC WITH DIFFERENTIAL/PLATELET
Abs Immature Granulocytes: 0.12 10*3/uL — ABNORMAL HIGH (ref 0.00–0.07)
Basophils Absolute: 0 10*3/uL (ref 0.0–0.1)
Basophils Relative: 0 %
Eosinophils Absolute: 0 10*3/uL (ref 0.0–0.5)
Eosinophils Relative: 0 %
HCT: 35.5 % — ABNORMAL LOW (ref 39.0–52.0)
Hemoglobin: 11.8 g/dL — ABNORMAL LOW (ref 13.0–17.0)
Immature Granulocytes: 1 %
Lymphocytes Relative: 5 %
Lymphs Abs: 0.5 10*3/uL — ABNORMAL LOW (ref 0.7–4.0)
MCH: 32.3 pg (ref 26.0–34.0)
MCHC: 33.2 g/dL (ref 30.0–36.0)
MCV: 97.3 fL (ref 80.0–100.0)
Monocytes Absolute: 0.4 10*3/uL (ref 0.1–1.0)
Monocytes Relative: 4 %
Neutro Abs: 8.6 10*3/uL — ABNORMAL HIGH (ref 1.7–7.7)
Neutrophils Relative %: 90 %
Platelets: 300 10*3/uL (ref 150–400)
RBC: 3.65 MIL/uL — ABNORMAL LOW (ref 4.22–5.81)
RDW: 11.9 % (ref 11.5–15.5)
WBC: 9.6 10*3/uL (ref 4.0–10.5)
nRBC: 0 % (ref 0.0–0.2)

## 2021-07-09 LAB — C-REACTIVE PROTEIN: CRP: 0.7 mg/dL (ref <1.0)

## 2021-07-09 LAB — GLUCOSE, CAPILLARY
Glucose-Capillary: 215 mg/dL — ABNORMAL HIGH (ref 70–99)
Glucose-Capillary: 313 mg/dL — ABNORMAL HIGH (ref 70–99)

## 2021-07-09 MED ORDER — PREDNISONE 20 MG PO TABS: ORAL_TABLET | ORAL | 0 refills | Status: DC

## 2021-07-09 MED ORDER — ASCORBIC ACID 500 MG PO TABS: 500.0000 mg | ORAL_TABLET | Freq: Every day | ORAL | 1 refills | Status: DC

## 2021-07-09 MED ORDER — ZINC SULFATE 220 (50 ZN) MG PO CAPS: 220.0000 mg | ORAL_CAPSULE | Freq: Every day | ORAL | 1 refills | Status: DC

## 2021-07-09 MED ORDER — IPRATROPIUM-ALBUTEROL 20-100 MCG/ACT IN AERS: 1.0000 | INHALATION_SPRAY | Freq: Four times a day (QID) | RESPIRATORY_TRACT | 1 refills | Status: DC | PRN

## 2021-07-09 NOTE — Progress Notes (Signed)
Inpatient Diabetes Program Recommendations  AACE/ADA: New Consensus Statement on Inpatient Glycemic Control   Target Ranges:  Prepandial:   less than 140 mg/dL      Peak postprandial:   less than 180 mg/dL (1-2 hours)      Critically ill patients:  140 - 180 mg/dL    Latest Reference Range & Units 07/08/21 07:16 07/08/21 11:15 07/08/21 16:54 07/08/21 21:46 07/09/21 07:13  Glucose-Capillary 70 - 99 mg/dL 518 (H) 841 (H) 660 (H) 140 (H) 215 (H)   Review of Glycemic Control  Diabetes history: DM2 Outpatient Diabetes medications: Glipizide 10 mg BID, Metformin 1000 mg BID, Rybelsus 14 mg daily, Tresiba 20 units daily Current orders for Inpatient glycemic control: Levemir 12 units BID, Novolog 0-15 units TID with meals, Novolog 0-5 units QHS, Novolog 4 units TID with meals; Solumedrol 60 mg Q12H   Inpatient Diabetes Program Recommendations:     Insulin: If steroids are continued as ordered, please consider increasing Levemir to 15 units BID with meals and increasing meal coverage to Novolog 6 units if patient eats at least 50% of meals.   Thanks, Orlando Penner, RN, MSN, CDE Diabetes Coordinator Inpatient Diabetes Program [phone] (Team Pager from 8am to 5pm)

## 2021-07-09 NOTE — Care Management Important Message (Signed)
Important Message  Patient Details  Name: [name] [name] MRN: [id number] Date of Birth: Sep 18, 1955   Medicare Important Message Given:  N/A - LOS <3 / Initial given by admissions     Corey Harold 07/09/2021, 2:00 PM

## 2021-07-09 NOTE — TOC Transition Note (Signed)
Transition of Care [facility]) - CM/SW Discharge Note   Patient Details  Name: [name] [name] MRN: [id number] Date of Birth: 1956-05-21  Transition of Care [facility]) CM/SW Contact:  Karn Cassis, LCSW Phone Number: 07/09/2021, 1:33 PM   Clinical Narrative:  Pt d/c today. Pt requesting home health PT and RN. [facility] with [facility] accepts referral and is aware of d/c today. Portable O2 already delivered to room. Morrie Sheldon with [employer] notified of d/c for home set up. Pt states he has transportation. RN updated.      Final next level of care: Home w Home Health Services Barriers to Discharge: Barriers Resolved   Patient Goals and CMS Choice Patient states their goals for this hospitalization and ongoing recovery are:: Return home CMS Medicare.gov Compare Post Acute Care list provided to:: Patient Choice offered to / list presented to : Patient  Discharge Placement                  Name of family member notified: pt only Patient and family notified of of transfer: 07/09/21  Discharge Plan and Services In-house Referral: Clinical Social Work Discharge Planning Services: CM Consult Post Acute Care Choice: Durable Medical Equipment          DME Arranged: Oxygen DME Agency: [employer] Date DME Agency Contacted: 07/07/21   Representative spoke with at DME Agency: Morrie Sheldon HH Arranged: RN, PT [facility] Agency: [facility] Date HH Agency Contacted: 07/09/21 Time HH Agency Contacted: 1332 Representative spoke with at [facility] Agency: Bonita Quin  Social Determinants of Health (SDOH) Interventions     Readmission Risk Interventions Readmission Risk Prevention Plan 07/07/2021  Transportation Screening Complete  Home Care Screening Complete  Medication Review (RN CM) Complete  Some recent data might be hidden

## 2021-07-09 NOTE — Discharge Summary (Signed)
Physician Discharge Summary  [name] [name] MBT:[id number] DOB: 30-Jul-1955 DOA: 07/04/2021  PCP: Dettinger, Fransisca Kaufmann, MD  Admit date: 07/04/2021 Discharge date: 07/09/2021  Time spent: 35 minutes  Recommendations for Outpatient Follow-up:  Close monitoring to CBGs/repeat A1c with further adjustment to hypoglycemia regimen as required. Repeat chest x-ray in 6 weeks to assure complete resolution of infiltrates. Repeat basic metabolic panel to follow to lites and renal function. Reassess blood pressure and further adjust antihypertensive regimen as needed.  Discharge Diagnoses:  Principal Problem:   Acute respiratory failure with hypoxia (HCC) Active Problems:   Essential hypertension   Aortic valve disorder   Ascending aortic aneurysm   GERD (gastroesophageal reflux disease)   Type 2 diabetes mellitus with hyperlipidemia ([place])   Pneumonia due to COVID-19 virus   Discharge Condition: Stable and improved.  Discharged home with instruction to follow-up with PCP in 10 days.  CODE STATUS: Full code  Diet recommendation: Heart healthy modified carbohydrate diet.  Filed Weights   07/04/21 1046 07/05/21 0604  Weight: 90.7 kg 84.7 kg    History of present illness:  [name] [name] is a 66 y.o. male with a history of coronary artery disease, aortic stenosis status post bicuspid aortic valve replacement, ascending aortic aneurysm that is stable, diabetes, OSA not on CPAP.  Patient seen for shortness of breath after positive COVID test.  He has been admitted for acute hypoxemic respiratory failure secondary to COVID-19 pneumonia and continues to have ongoing symptoms of significant shortness of breath.  He is weaning down on his oxygen requirements and laboratory data is remaining stable, however.  Plan will be to increase IV Solu-Medrol to 60 mg twice daily on 1/3 to see if this improves his clinical symptoms better.  Hospital Course:  1-acute hypoxemic respiratory failure secondary to  COVID-19 pneumonia -At discharge still requiring 2 L nasal cannula supplementation -Patient has prior history of vaccination and booster -Completed successfully remdesivir infusion for 5 days; discharge on tapering steroids, bronchodilators, vitamin C and zinc.  2-type 2 diabetes with hyperglycemia -In the setting of his steroids usage -Resume home hypoglycemic regimen -Advised to maintain adequate hydration follow modified carbohydrate diet -Close follow-up with PCP to further adjust medications as required.  3-gastroesophageal flux disease -Continue PPI.  4-aortic valve disorder status post replacement with ascending aortic aneurysm -Appears to be stable -Continue outpatient follow-up with cardiology and thoracic surgery.  5-hypertension -Is stable and well-controlled -Will resume home antihypertensive agent -Advised to follow heart healthy diet.  6-hyperlipidemia -Continue Lipitor.  Procedures: See below for x-ray reports.  Consultations: None   Discharge Exam: Vitals:   07/09/21 0845 07/09/21 1200  BP:  124/79  Pulse:  66  Resp:  20  Temp:  97.7 F (36.5 C)  SpO2: 93% 95%    General: Prior, no chest pain, no nausea, no vomiting.  Using 2 L nasal cannula supplementation with good saturation.  Still reporting intermittent coughing spells and feeling weak.  No using accessory muscle. Cardiovascular: S1 and S2, no rubs, no gallops, no JVD. Respiratory: Positive scattered rhonchi; no using accessory muscle.  Good saturation on 2 L nasal cannula supplementation. Abdomen: Soft, nontender, nondistended, positive bowel sounds Extremities: No cyanosis or clubbing.  Discharge Instructions   Discharge Instructions     Diet - low sodium heart healthy   Complete by: As directed    Discharge instructions   Complete by: As directed    Take medications as prescribed Maintain adequate hydration Arrange follow-up with PCP in 10  days. Continue the use of oxygen  supplementation as instructed and pace yourself while performing activities. Follow heart healthy diet.      Allergies as of 07/09/2021       Reactions   Glyxambi [empagliflozin-linagliptin] Diarrhea, Itching   He was able to take Jardiance alone with no side effects so it was the Tradjenta part that most likely gave him the reaction   Hctz [hydrochlorothiazide]    Nausea and headache    Sulfa Antibiotics Nausea And Vomiting   Sulfonamide Derivatives         Medication List     STOP taking these medications    metroNIDAZOLE 250 MG tablet Commonly known as: FLAGYL   sodium fluoride 1.1 % Gel dental gel Commonly known as: FLUORISHIELD       TAKE these medications    ascorbic acid 500 MG tablet Commonly known as: VITAMIN C Take 1 tablet (500 mg total) by mouth daily. Start taking on: July 10, 2021   aspirin EC 81 MG tablet Take 81 mg by mouth daily.   atenolol 25 MG tablet Commonly known as: TENORMIN Take 1 tablet (25 mg total) by mouth daily.   atorvastatin 40 MG tablet Commonly known as: LIPITOR Take 1 tablet (40 mg total) by mouth daily.   benzonatate 200 MG capsule Commonly known as: TESSALON Take 1 capsule (200 mg total) by mouth 3 (three) times daily as needed.   blood glucose meter kit and supplies Kit Dispense based on patient and insurance preference. Use up to four times daily as directed. (FOR ICD-9 250.00, 250.01).   cholecalciferol 1000 units tablet Commonly known as: VITAMIN D Take 2,000 Units by mouth 2 (two) times daily.   fexofenadine 180 MG tablet Commonly known as: ALLEGRA Take 180 mg by mouth daily as needed.   fluticasone 50 MCG/ACT nasal spray Commonly known as: FLONASE Place 2 sprays into the nose daily as needed for allergies.   GAS-X PO Take 1 capsule by mouth daily as needed.   glipiZIDE 5 MG tablet Commonly known as: GLUCOTROL Take 2 tablets (10 mg total) by mouth 2 (two) times daily before a meal.   Global Ease  Inject Pen Needles 31G X 5 MM Misc Generic drug: Insulin Pen Needle Use daily as directed Dx E11.69   Ipratropium-Albuterol 20-100 MCG/ACT Aers respimat Commonly known as: COMBIVENT Inhale 1 puff into the lungs every 6 (six) hours as needed for wheezing or shortness of breath.   lisinopril 20 MG tablet Commonly known as: ZESTRIL Take 1 tablet (20 mg total) by mouth daily.   metFORMIN 1000 MG tablet Commonly known as: GLUCOPHAGE Take 1 tablet (1,000 mg total) by mouth 2 (two) times daily with a meal.   OneTouch Delica Plus [id number] Misc 4 (four) times daily.   OneTouch Ultra test strip Generic drug: glucose blood Test BS 4 times daily Dx E11.69   predniSONE 20 MG tablet Commonly known as: DELTASONE Take 3 tablets by mouth daily x2 day; then 2 tablet by mouth daily x2 days; then 1 tablet by mouth daily x3 days; then half tablet by mouth daily x3 days and stop prednisone.   promethazine 25 MG tablet Commonly known as: PHENERGAN Take 1 tablet (25 mg total) by mouth every 8 (eight) hours as needed for nausea or vomiting.   promethazine-dextromethorphan 6.25-15 MG/5ML syrup Commonly known as: PROMETHAZINE-DM Take 5 mLs by mouth 3 (three) times daily as needed for cough.   Rybelsus 14 MG Tabs Generic drug: Semaglutide Take  14 mg by mouth daily.   temazepam 15 MG capsule Commonly known as: RESTORIL TAKE ONE CAPSULE BY MOUTH AT BEDTIME AS NEEDED   Tresiba FlexTouch 100 UNIT/ML FlexTouch Pen Generic drug: insulin degludec Inject 20 Units into the skin daily.   zinc sulfate 220 (50 Zn) MG capsule Take 1 capsule (220 mg total) by mouth daily. Start taking on: July 10, 2021               Durable Medical Equipment  (From admission, onward)           Start     Ordered   07/09/21 0841  For home use only DME oxygen  Once       Question Answer Comment  Length of Need 6 Months   Mode or (Route) Nasal cannula   Liters per Minute 2   Frequency Continuous  (stationary and portable oxygen unit needed)   Oxygen conserving device Yes   Oxygen delivery system Gas      07/09/21 0840           Allergies  Allergen Reactions   Glyxambi [Empagliflozin-Linagliptin] Diarrhea and Itching    He was able to take Jardiance alone with no side effects so it was the Tradjenta part that most likely gave him the reaction   Hctz [Hydrochlorothiazide]     Nausea and headache    Sulfa Antibiotics Nausea And Vomiting   Sulfonamide Derivatives     Follow-up Information     [facility] Follow [facility].   Specialty: Home Health Services Why: Will contact you to schedule home health visits.        Dettinger, Fransisca Kaufmann, MD. Schedule an appointment as soon as possible for a visit in 10 day(s).   Specialties: Family Medicine, Cardiology Contact information: [place] [address] [phone]         Arnoldo Lenis, MD .   Specialty: Cardiology Contact information: [place] [address] [phone]                 The results of significant diagnostics from this hospitalization (including imaging, microbiology, ancillary and laboratory) are listed below for reference.    Significant Diagnostic Studies: CT Angio Chest PE W and/or Wo Contrast  Result Date: 07/04/2021 CLINICAL DATA:  Dyspnea with exertion. EXAM: CT ANGIOGRAPHY CHEST WITH CONTRAST TECHNIQUE: Multidetector CT imaging of the chest was performed using the standard protocol during bolus administration of intravenous contrast. Multiplanar CT image reconstructions and MIPs were obtained to evaluate the vascular anatomy. CONTRAST:  14mL OMNIPAQUE IOHEXOL 350 MG/ML SOLN COMPARISON:  January 15, 2020. FINDINGS: Cardiovascular: Satisfactory opacification of the pulmonary arteries to the segmental level. No evidence of pulmonary embolism. Normal heart size. No pericardial effusion. Status post aortic valve repair. Atherosclerosis of  thoracic aorta is noted without aneurysm or dissection. Mediastinum/Nodes: No enlarged mediastinal, hilar, or axillary lymph nodes. Thyroid gland, trachea, and esophagus demonstrate no significant findings. Lungs/Pleura: No pneumothorax or pleural effusion is noted. Multiple patchy airspace opacities are noted in both lower lobes and left upper lobe concerning for multifocal pneumonia. Upper Abdomen: No acute abnormality. Musculoskeletal: No chest wall abnormality. No acute or significant osseous findings. Review of the MIP images confirms the above findings. IMPRESSION: No definite evidence of pulmonary embolus. Multiple patchy airspace opacities are noted in both lungs concerning for multifocal pneumonia. Aortic Atherosclerosis (ICD10-I70.0). Electronically Signed   By: Marijo Conception M.D.   On:  07/04/2021 13:38   DG Chest Port 1 View  Result Date: 07/04/2021 CLINICAL DATA:  Shortness of breath on exertion since yesterday, COVID positive. EXAM: PORTABLE CHEST 1 VIEW COMPARISON:  February 24, 2018. FINDINGS: EKG leads project over the chest. Cardiomediastinal contours and hilar structures are stable following median sternotomy for aortic valve replacement. Airspace disease in the LEFT lower chest along the LEFT heart border and in the retrocardiac region. Lungs are otherwise clear. On limited assessment there is no acute skeletal process. IMPRESSION: Airspace disease likely in lingula and potentially LEFT lower lobe, concerning for developing pneumonia in the setting of COVID-19 infection. Electronically Signed   By: Zetta Bills M.D.   On: 07/04/2021 12:23    Microbiology: Recent Results (from the past 240 hour(s))  Resp Panel by RT-PCR (Flu A&B, Covid) Nasopharyngeal Swab     Status: Abnormal   Collection Time: 07/04/21  2:54 PM   Specimen: Nasopharyngeal Swab; Nasopharyngeal(NP) swabs in vial transport medium  Result Value Ref Range Status   SARS Coronavirus 2 by RT PCR POSITIVE (A) NEGATIVE Final     Comment: (NOTE) SARS-CoV-2 target nucleic acids are DETECTED.  The SARS-CoV-2 RNA is generally detectable in upper respiratory specimens during the acute phase of infection. Positive results are indicative of the presence of the identified virus, but do not rule out bacterial infection or co-infection with other pathogens not detected by the test. Clinical correlation with patient history and other diagnostic information is necessary to determine patient infection status. The expected result is Negative.  Fact Sheet for Patients: EntrepreneurPulse.com.au  Fact Sheet for Healthcare Providers: IncredibleEmployment.be  This test is not yet approved or cleared by the [place] FDA and  has been authorized for detection and/or diagnosis of SARS-CoV-2 by FDA under an Emergency Use Authorization (EUA).  This EUA will remain in effect (meaning this test can be used) for the duration of  the COVID-19 declaration under Section 564(b)(1) of the A ct, 21 U.S.C. section 360bbb-3(b)(1), unless the authorization is terminated or revoked sooner.     Influenza A by PCR NEGATIVE NEGATIVE Final   Influenza B by PCR NEGATIVE NEGATIVE Final    Comment: (NOTE) The Xpert Xpress SARS-CoV-2/FLU/RSV plus assay is intended as an aid in the diagnosis of influenza from Nasopharyngeal swab specimens and should not be used as a sole basis for treatment. Nasal washings and aspirates are unacceptable for Xpert Xpress SARS-CoV-2/FLU/RSV testing.  Fact Sheet for Patients: EntrepreneurPulse.com.au  Fact Sheet for Healthcare Providers: IncredibleEmployment.be  This test is not yet approved or cleared by the [place] FDA and has been authorized for detection and/or diagnosis of SARS-CoV-2 by FDA under an Emergency Use Authorization (EUA). This EUA will remain in effect (meaning this test can be used) for the duration of  the COVID-19 declaration under Section 564(b)(1) of the Act, 21 U.S.C. section 360bbb-3(b)(1), unless the authorization is terminated or revoked.  Performed at [facility], [address] [place], [place] [address]      Labs: Basic Metabolic Panel: Recent Labs  Lab 07/05/21 [phone] 07/06/21 0537 07/07/21 0609 07/08/21 0536 07/09/21 0630  NA 142 142 138 136 136  K 4.7 4.6 4.3 5.0 5.0  CL 110 110 107 107 107  CO2 19* 21* 19* 17* 18*  GLUCOSE 200* 163* 154* 260* 213*  BUN 52* 50* 48* 55* 65*  CREATININE 1.54* 1.48* 1.37* 1.38* 1.59*  CALCIUM 8.8* 9.2 9.1 9.4 9.3   Liver Function Tests: Recent Labs  Lab 07/05/21 [phone]  07/06/21 0537 07/07/21 0609 07/08/21 0536 07/09/21 0630  AST 26 30 36 30 28  ALT 26 30 42 46* 47*  ALKPHOS 57 55 59 63 64  BILITOT 0.7 0.6 0.5 0.4 0.7  PROT 7.2 7.5 7.5 7.5 7.3  ALBUMIN 3.0* 3.2* 3.3* 3.2* 3.4*   CBC: Recent Labs  Lab 07/05/21 0649 07/06/21 0537 07/07/21 0609 07/08/21 0536 07/09/21 0630  WBC 3.1* 4.8 5.7 6.9 9.6  NEUTROABS 2.4 3.7 4.4 6.1 8.6*  HGB 10.4* 11.4* 11.7* 11.7* 11.8*  HCT 30.9* 34.8* 35.1* 34.9* 35.5*  MCV 101.0* 101.8* 99.4 96.9 97.3  PLT 243 263 276 289 300   CBG: Recent Labs  Lab 07/08/21 1115 07/08/21 1654 07/08/21 2146 07/09/21 0713 07/09/21 1129  GLUCAP 355* 268* 140* 215* 313*    Signed:  Barton Dubois MD.  [facility] 07/09/2021, 1:31 PM

## 2021-07-10 ENCOUNTER — Telehealth: Payer: Self-pay

## 2021-07-10 DIAGNOSIS — N200 Calculus of kidney: Secondary | ICD-10-CM | POA: Insufficient documentation

## 2021-07-10 NOTE — Telephone Encounter (Signed)
Transition Care Management Unsuccessful Follow-up Telephone Call  Date of discharge and from where:  [name] 07/09/21 - acute resp. failure  Attempts:  1st Attempt  Reason for unsuccessful TCM follow-up call:  No answer/busy no voice mail

## 2021-07-10 NOTE — Telephone Encounter (Signed)
Transition Care Management Unsuccessful Follow-up Telephone Call  Date of discharge and from where:  [facility] - acute resp fail.  Attempts:  2nd Attempt  Reason for unsuccessful TCM follow-up call:  Left voice message on Marty's cell

## 2021-07-11 ENCOUNTER — Telehealth: Payer: Self-pay | Admitting: Cardiology

## 2021-07-11 ENCOUNTER — Telehealth: Payer: Self-pay | Admitting: *Deleted

## 2021-07-11 ENCOUNTER — Telehealth: Payer: Self-pay | Admitting: Family Medicine

## 2021-07-11 DIAGNOSIS — J9601 Acute respiratory failure with hypoxia: Secondary | ICD-10-CM | POA: Diagnosis not present

## 2021-07-11 DIAGNOSIS — E559 Vitamin D deficiency, unspecified: Secondary | ICD-10-CM | POA: Diagnosis not present

## 2021-07-11 DIAGNOSIS — E785 Hyperlipidemia, unspecified: Secondary | ICD-10-CM | POA: Diagnosis not present

## 2021-07-11 DIAGNOSIS — I7121 Aneurysm of the ascending aorta, without rupture: Secondary | ICD-10-CM | POA: Diagnosis not present

## 2021-07-11 DIAGNOSIS — G4733 Obstructive sleep apnea (adult) (pediatric): Secondary | ICD-10-CM | POA: Diagnosis not present

## 2021-07-11 DIAGNOSIS — I4891 Unspecified atrial fibrillation: Secondary | ICD-10-CM | POA: Diagnosis not present

## 2021-07-11 DIAGNOSIS — Z955 Presence of coronary angioplasty implant and graft: Secondary | ICD-10-CM | POA: Diagnosis not present

## 2021-07-11 DIAGNOSIS — K760 Fatty (change of) liver, not elsewhere classified: Secondary | ICD-10-CM | POA: Diagnosis not present

## 2021-07-11 DIAGNOSIS — I2510 Atherosclerotic heart disease of native coronary artery without angina pectoris: Secondary | ICD-10-CM | POA: Diagnosis not present

## 2021-07-11 DIAGNOSIS — M19049 Primary osteoarthritis, unspecified hand: Secondary | ICD-10-CM | POA: Diagnosis not present

## 2021-07-11 DIAGNOSIS — E1165 Type 2 diabetes mellitus with hyperglycemia: Secondary | ICD-10-CM | POA: Diagnosis not present

## 2021-07-11 DIAGNOSIS — Z87891 Personal history of nicotine dependence: Secondary | ICD-10-CM | POA: Diagnosis not present

## 2021-07-11 DIAGNOSIS — Z9981 Dependence on supplemental oxygen: Secondary | ICD-10-CM | POA: Diagnosis not present

## 2021-07-11 DIAGNOSIS — U071 COVID-19: Secondary | ICD-10-CM | POA: Diagnosis not present

## 2021-07-11 DIAGNOSIS — Z7952 Long term (current) use of systemic steroids: Secondary | ICD-10-CM | POA: Diagnosis not present

## 2021-07-11 DIAGNOSIS — Z7984 Long term (current) use of oral hypoglycemic drugs: Secondary | ICD-10-CM | POA: Diagnosis not present

## 2021-07-11 DIAGNOSIS — K219 Gastro-esophageal reflux disease without esophagitis: Secondary | ICD-10-CM | POA: Diagnosis not present

## 2021-07-11 DIAGNOSIS — I119 Hypertensive heart disease without heart failure: Secondary | ICD-10-CM | POA: Diagnosis not present

## 2021-07-11 DIAGNOSIS — J1282 Pneumonia due to coronavirus disease 2019: Secondary | ICD-10-CM | POA: Diagnosis not present

## 2021-07-11 NOTE — Telephone Encounter (Signed)
Yes please have him hold his lisinopril and his atenolol and keep a close eye on his blood pressures.  If he continues to feel poorly then have him either go back to the emergency department or come in and see [place].

## 2021-07-11 NOTE — Telephone Encounter (Signed)
Informed patient - per hospital discharge summary - routine f/u with cardiology was recommended.  He will keep his April f/u as planned.

## 2021-07-11 NOTE — Telephone Encounter (Signed)
Transition Care Management Follow-up Telephone Call Date of discharge and from where: 07/09/21 [facility] - acute [facility] failure  How have you been since you were released from the hospital? Slowly getting better, just overall not feeling well Any questions or concerns? No  Items Reviewed: Did the pt receive and understand the discharge instructions provided? Yes  Medications obtained and verified? Yes  Other? No  Any new allergies since your discharge? No  Dietary orders reviewed? Yes Do you have support at home? Yes   Home Care and Equipment/Supplies: Were home health services ordered? no If so, what is the name of the agency? N/a  Has the agency set up a time to come to the patient's home? not applicable Were any new equipment or medical supplies ordered?  Yes: Oxygen What is the name of the medical supply agency? unknown Were you able to get the supplies/equipment? yes Do you have any questions related to the use of the equipment or supplies? No  Functional Questionnaire: (I = Independent and D = Dependent) ADLs: I  Bathing/Dressing- I  Meal Prep- I  Eating- I  Maintaining continence- I  Transferring/Ambulation- I  Managing Meds- I  Follow up appointments reviewed:  PCP Hospital f/u appt confirmed? Yes  Scheduled to see Dettinger on 07/24/21 @ 2:55 (patient refused to see another provider and Dettinger has no openings within 14 days of d/c - I told him if cancellation comes up sooner, we may call). Specialist Hospital f/u appt confirmed? Yes  Scheduled to see [place] on 4/14 - He will call and ask if he needs to be seen sooner Are transportation arrangements needed? No  If their condition worsens, is the pt aware to call PCP or go to the Emergency Dept.? Yes Was the patient provided with contact information for the PCP's office or ED? Yes Was to pt encouraged to call back with questions or concerns? Yes

## 2021-07-11 NOTE — Telephone Encounter (Signed)
Appt made, pt only wanted to see Dr. Louanne Skye so it is 3 days over 10 days

## 2021-07-11 NOTE — Telephone Encounter (Signed)
Patient's wife called stating patient was in the hospital recently COVID that turned into pneumonia.  She wants to know if he needs to be seen by Dr. Harl Bowie sooner than April.

## 2021-07-11 NOTE — Telephone Encounter (Signed)
Okey Regal from [facility] is at patients home doing an admission from his recent hosp stay. She says he has lost 20 lb during his sickness and his BP is low, sitting is 80/40. He is extremely dizzy, could some of his BP meds be cut back?

## 2021-07-11 NOTE — Telephone Encounter (Signed)
Left message with Okey Regal. Called pt and gave him new orders, he states understanding

## 2021-07-12 DIAGNOSIS — U071 COVID-19: Secondary | ICD-10-CM | POA: Diagnosis not present

## 2021-07-12 DIAGNOSIS — J9601 Acute respiratory failure with hypoxia: Secondary | ICD-10-CM | POA: Diagnosis not present

## 2021-07-14 ENCOUNTER — Other Ambulatory Visit: Payer: Self-pay

## 2021-07-14 ENCOUNTER — Encounter (HOSPITAL_COMMUNITY): Payer: Self-pay | Admitting: Emergency Medicine

## 2021-07-14 ENCOUNTER — Emergency Department (HOSPITAL_COMMUNITY): Payer: HMO

## 2021-07-14 ENCOUNTER — Inpatient Hospital Stay (HOSPITAL_COMMUNITY)
Admission: EM | Admit: 2021-07-14 | Discharge: 2021-07-17 | DRG: 682 | Disposition: A | Discharge status: Home / Self Care | Payer: HMO | Attending: Family Medicine | Admitting: Family Medicine

## 2021-07-14 DIAGNOSIS — I350 Nonrheumatic aortic (valve) stenosis: Secondary | ICD-10-CM | POA: Diagnosis present

## 2021-07-14 DIAGNOSIS — U099 Post covid-19 condition, unspecified: Secondary | ICD-10-CM | POA: Diagnosis not present

## 2021-07-14 DIAGNOSIS — U071 COVID-19: Secondary | ICD-10-CM | POA: Diagnosis not present

## 2021-07-14 DIAGNOSIS — Z8709 Personal history of other diseases of the respiratory system: Secondary | ICD-10-CM | POA: Diagnosis not present

## 2021-07-14 DIAGNOSIS — Z7984 Long term (current) use of oral hypoglycemic drugs: Secondary | ICD-10-CM

## 2021-07-14 DIAGNOSIS — Z8616 Personal history of COVID-19: Secondary | ICD-10-CM | POA: Diagnosis not present

## 2021-07-14 DIAGNOSIS — E785 Hyperlipidemia, unspecified: Secondary | ICD-10-CM | POA: Diagnosis present

## 2021-07-14 DIAGNOSIS — R197 Diarrhea, unspecified: Secondary | ICD-10-CM | POA: Diagnosis not present

## 2021-07-14 DIAGNOSIS — N179 Acute kidney failure, unspecified: Secondary | ICD-10-CM | POA: Diagnosis not present

## 2021-07-14 DIAGNOSIS — R112 Nausea with vomiting, unspecified: Secondary | ICD-10-CM | POA: Diagnosis present

## 2021-07-14 DIAGNOSIS — Y92009 Unspecified place in unspecified non-institutional (private) residence as the place of occurrence of the external cause: Secondary | ICD-10-CM | POA: Diagnosis not present

## 2021-07-14 DIAGNOSIS — J189 Pneumonia, unspecified organism: Secondary | ICD-10-CM | POA: Diagnosis present

## 2021-07-14 DIAGNOSIS — Z953 Presence of xenogenic heart valve: Secondary | ICD-10-CM

## 2021-07-14 DIAGNOSIS — R0902 Hypoxemia: Secondary | ICD-10-CM | POA: Diagnosis not present

## 2021-07-14 DIAGNOSIS — R531 Weakness: Secondary | ICD-10-CM | POA: Diagnosis present

## 2021-07-14 DIAGNOSIS — K219 Gastro-esophageal reflux disease without esophagitis: Secondary | ICD-10-CM | POA: Diagnosis not present

## 2021-07-14 DIAGNOSIS — I7121 Aneurysm of the ascending aorta, without rupture: Secondary | ICD-10-CM | POA: Diagnosis not present

## 2021-07-14 DIAGNOSIS — E872 Acidosis, unspecified: Secondary | ICD-10-CM | POA: Diagnosis not present

## 2021-07-14 DIAGNOSIS — I129 Hypertensive chronic kidney disease with stage 1 through stage 4 chronic kidney disease, or unspecified chronic kidney disease: Secondary | ICD-10-CM | POA: Diagnosis not present

## 2021-07-14 DIAGNOSIS — N189 Chronic kidney disease, unspecified: Secondary | ICD-10-CM | POA: Diagnosis present

## 2021-07-14 DIAGNOSIS — I959 Hypotension, unspecified: Secondary | ICD-10-CM | POA: Diagnosis present

## 2021-07-14 DIAGNOSIS — J1282 Pneumonia due to coronavirus disease 2019: Secondary | ICD-10-CM | POA: Diagnosis not present

## 2021-07-14 DIAGNOSIS — I2510 Atherosclerotic heart disease of native coronary artery without angina pectoris: Secondary | ICD-10-CM | POA: Diagnosis present

## 2021-07-14 DIAGNOSIS — Z833 Family history of diabetes mellitus: Secondary | ICD-10-CM | POA: Diagnosis not present

## 2021-07-14 DIAGNOSIS — R5383 Other fatigue: Secondary | ICD-10-CM | POA: Diagnosis not present

## 2021-07-14 DIAGNOSIS — R7989 Other specified abnormal findings of blood chemistry: Secondary | ICD-10-CM

## 2021-07-14 DIAGNOSIS — Z820 Family history of epilepsy and other diseases of the nervous system: Secondary | ICD-10-CM | POA: Diagnosis not present

## 2021-07-14 DIAGNOSIS — Z7982 Long term (current) use of aspirin: Secondary | ICD-10-CM

## 2021-07-14 DIAGNOSIS — Z79899 Other long term (current) drug therapy: Secondary | ICD-10-CM

## 2021-07-14 DIAGNOSIS — R1110 Vomiting, unspecified: Secondary | ICD-10-CM | POA: Diagnosis not present

## 2021-07-14 DIAGNOSIS — E875 Hyperkalemia: Secondary | ICD-10-CM | POA: Diagnosis not present

## 2021-07-14 DIAGNOSIS — E1169 Type 2 diabetes mellitus with other specified complication: Secondary | ICD-10-CM

## 2021-07-14 DIAGNOSIS — R0602 Shortness of breath: Secondary | ICD-10-CM | POA: Diagnosis not present

## 2021-07-14 DIAGNOSIS — J9601 Acute respiratory failure with hypoxia: Secondary | ICD-10-CM | POA: Diagnosis not present

## 2021-07-14 DIAGNOSIS — Z8551 Personal history of malignant neoplasm of bladder: Secondary | ICD-10-CM | POA: Diagnosis not present

## 2021-07-14 DIAGNOSIS — G4733 Obstructive sleep apnea (adult) (pediatric): Secondary | ICD-10-CM | POA: Diagnosis present

## 2021-07-14 DIAGNOSIS — J180 Bronchopneumonia, unspecified organism: Secondary | ICD-10-CM | POA: Diagnosis not present

## 2021-07-14 DIAGNOSIS — I119 Hypertensive heart disease without heart failure: Secondary | ICD-10-CM | POA: Diagnosis not present

## 2021-07-14 DIAGNOSIS — W19XXXA Unspecified fall, initial encounter: Secondary | ICD-10-CM | POA: Diagnosis not present

## 2021-07-14 DIAGNOSIS — R0689 Other abnormalities of breathing: Secondary | ICD-10-CM | POA: Diagnosis not present

## 2021-07-14 DIAGNOSIS — E1165 Type 2 diabetes mellitus with hyperglycemia: Secondary | ICD-10-CM | POA: Diagnosis not present

## 2021-07-14 DIAGNOSIS — R9431 Abnormal electrocardiogram [ECG] [EKG]: Secondary | ICD-10-CM | POA: Diagnosis not present

## 2021-07-14 DIAGNOSIS — I10 Essential (primary) hypertension: Secondary | ICD-10-CM

## 2021-07-14 DIAGNOSIS — N1831 Chronic kidney disease, stage 3a: Secondary | ICD-10-CM | POA: Diagnosis present

## 2021-07-14 DIAGNOSIS — R064 Hyperventilation: Secondary | ICD-10-CM | POA: Diagnosis not present

## 2021-07-14 DIAGNOSIS — I4891 Unspecified atrial fibrillation: Secondary | ICD-10-CM | POA: Diagnosis not present

## 2021-07-14 DIAGNOSIS — R42 Dizziness and giddiness: Secondary | ICD-10-CM | POA: Diagnosis present

## 2021-07-14 DIAGNOSIS — E1122 Type 2 diabetes mellitus with diabetic chronic kidney disease: Secondary | ICD-10-CM | POA: Diagnosis not present

## 2021-07-14 DIAGNOSIS — N182 Chronic kidney disease, stage 2 (mild): Secondary | ICD-10-CM | POA: Diagnosis present

## 2021-07-14 DIAGNOSIS — Z955 Presence of coronary angioplasty implant and graft: Secondary | ICD-10-CM | POA: Diagnosis not present

## 2021-07-14 DIAGNOSIS — K760 Fatty (change of) liver, not elsewhere classified: Secondary | ICD-10-CM | POA: Diagnosis present

## 2021-07-14 DIAGNOSIS — R918 Other nonspecific abnormal finding of lung field: Secondary | ICD-10-CM | POA: Diagnosis not present

## 2021-07-14 DIAGNOSIS — Z043 Encounter for examination and observation following other accident: Secondary | ICD-10-CM | POA: Diagnosis not present

## 2021-07-14 DIAGNOSIS — E860 Dehydration: Secondary | ICD-10-CM | POA: Diagnosis not present

## 2021-07-14 DIAGNOSIS — Z794 Long term (current) use of insulin: Secondary | ICD-10-CM

## 2021-07-14 LAB — HEPARIN LEVEL (UNFRACTIONATED): Heparin Unfractionated: >1.1 IU/mL — ABNORMAL HIGH (ref 0.30–0.70)

## 2021-07-14 LAB — TROPONIN I (HIGH SENSITIVITY)
Troponin I (High Sensitivity): 8 ng/L (ref <18)
Troponin I (High Sensitivity): 8 ng/L (ref <18)

## 2021-07-14 LAB — COMPREHENSIVE METABOLIC PANEL
ALT: 39 U/L (ref 0–44)
AST: 25 U/L (ref 15–41)
Albumin: 3.4 g/dL — ABNORMAL LOW (ref 3.5–5.0)
Alkaline Phosphatase: 71 U/L (ref 38–126)
Anion gap: 13 (ref 5–15)
BUN: 98 mg/dL — ABNORMAL HIGH (ref 8–23)
CO2: 13 mmol/L — ABNORMAL LOW (ref 22–32)
Calcium: 9.3 mg/dL (ref 8.9–10.3)
Chloride: 113 mmol/L — ABNORMAL HIGH (ref 98–111)
Creatinine, Ser: 2.22 mg/dL — ABNORMAL HIGH (ref 0.61–1.24)
GFR, Estimated: 32 mL/min — ABNORMAL LOW (ref >60)
Glucose, Bld: 124 mg/dL — ABNORMAL HIGH (ref 70–99)
Potassium: 5.7 mmol/L — ABNORMAL HIGH (ref 3.5–5.1)
Sodium: 139 mmol/L (ref 135–145)
Total Bilirubin: 0.7 mg/dL (ref 0.3–1.2)
Total Protein: 7.2 g/dL (ref 6.5–8.1)

## 2021-07-14 LAB — CBC WITH DIFFERENTIAL/PLATELET
Abs Immature Granulocytes: 0.07 10*3/uL (ref 0.00–0.07)
Basophils Absolute: 0 10*3/uL (ref 0.0–0.1)
Basophils Relative: 0 %
Eosinophils Absolute: 0 10*3/uL (ref 0.0–0.5)
Eosinophils Relative: 0 %
HCT: 41.6 % (ref 39.0–52.0)
Hemoglobin: 13.8 g/dL (ref 13.0–17.0)
Immature Granulocytes: 1 %
Lymphocytes Relative: 6 %
Lymphs Abs: 0.8 10*3/uL (ref 0.7–4.0)
MCH: 33.6 pg (ref 26.0–34.0)
MCHC: 33.2 g/dL (ref 30.0–36.0)
MCV: 101.2 fL — ABNORMAL HIGH (ref 80.0–100.0)
Monocytes Absolute: 1.1 10*3/uL — ABNORMAL HIGH (ref 0.1–1.0)
Monocytes Relative: 8 %
Neutro Abs: 11.7 10*3/uL — ABNORMAL HIGH (ref 1.7–7.7)
Neutrophils Relative %: 85 %
Platelets: 275 10*3/uL (ref 150–400)
RBC: 4.11 MIL/uL — ABNORMAL LOW (ref 4.22–5.81)
RDW: 12.8 % (ref 11.5–15.5)
WBC: 13.7 10*3/uL — ABNORMAL HIGH (ref 4.0–10.5)
nRBC: 0 % (ref 0.0–0.2)

## 2021-07-14 LAB — URINALYSIS, ROUTINE W REFLEX MICROSCOPIC
Bilirubin Urine: NEGATIVE
Glucose, UA: NEGATIVE mg/dL
Hgb urine dipstick: NEGATIVE
Ketones, ur: NEGATIVE mg/dL
Leukocytes,Ua: NEGATIVE
Nitrite: NEGATIVE
Protein, ur: NEGATIVE mg/dL
Specific Gravity, Urine: 1.017 (ref 1.005–1.030)
pH: 5 (ref 5.0–8.0)

## 2021-07-14 LAB — LACTIC ACID, PLASMA
Lactic Acid, Venous: 5 mmol/L (ref 0.5–1.9)
Lactic Acid, Venous: 2.7 mmol/L (ref 0.5–1.9)

## 2021-07-14 LAB — MRSA NEXT GEN BY PCR, NASAL: MRSA by PCR Next Gen: NOT DETECTED

## 2021-07-14 MED ORDER — TEMAZEPAM 15 MG PO CAPS: 15.0000 mg | ORAL_CAPSULE | Freq: Every evening | ORAL | Status: DC | PRN

## 2021-07-14 MED ORDER — ORAL CARE MOUTH RINSE
15.0000 mL | Freq: Two times a day (BID) | OROMUCOSAL | Status: DC
  Administered 2021-07-14 – 2021-07-17 (×7): 15 mL via OROMUCOSAL

## 2021-07-14 MED ORDER — ACETAMINOPHEN 325 MG PO TABS: 650.0000 mg | ORAL_TABLET | Freq: Four times a day (QID) | ORAL | Status: DC | PRN

## 2021-07-14 MED ORDER — POLYETHYLENE GLYCOL 3350 17 G PO PACK
17.0000 g | PACK | Freq: Every day | ORAL | Status: DC | PRN
Start: 2021-07-14 — End: 2021-07-17

## 2021-07-14 MED ORDER — ONDANSETRON HCL 4 MG/2ML IJ SOLN
4.0000 mg | Freq: Four times a day (QID) | INTRAMUSCULAR | Status: DC | PRN
  Administered 2021-07-16 (×2): 4 mg via INTRAVENOUS
  Filled 2021-07-14 (×2): qty 2

## 2021-07-14 MED ORDER — CHLORHEXIDINE GLUCONATE CLOTH 2 % EX PADS
6.0000 | MEDICATED_PAD | Freq: Every day | CUTANEOUS | Status: DC
  Administered 2021-07-14 – 2021-07-16 (×2): 6 via TOPICAL

## 2021-07-14 MED ORDER — ACETAMINOPHEN 650 MG RE SUPP: 650.0000 mg | Freq: Four times a day (QID) | RECTAL | Status: DC | PRN

## 2021-07-14 MED ORDER — METOPROLOL TARTRATE 5 MG/5ML IV SOLN
2.5000 mg | Freq: Once | INTRAVENOUS | Status: AC
  Administered 2021-07-14: 2.5 mg via INTRAVENOUS
  Filled 2021-07-14: qty 5

## 2021-07-14 MED ORDER — ONDANSETRON HCL 4 MG/2ML IJ SOLN
4.0000 mg | Freq: Once | INTRAMUSCULAR | Status: AC
  Administered 2021-07-14: 4 mg via INTRAVENOUS
  Filled 2021-07-14: qty 2

## 2021-07-14 MED ORDER — LACTATED RINGERS IV BOLUS
1000.0000 mL | Freq: Once | INTRAVENOUS | Status: AC
  Administered 2021-07-14: 1000 mL via INTRAVENOUS

## 2021-07-14 MED ORDER — DILTIAZEM HCL 25 MG/5ML IV SOLN
20.0000 mg | Freq: Once | INTRAVENOUS | Status: AC
  Administered 2021-07-14: 20 mg via INTRAVENOUS
  Filled 2021-07-14: qty 5

## 2021-07-14 MED ORDER — SODIUM BICARBONATE 8.4 % IV SOLN
INTRAVENOUS | Status: DC
  Filled 2021-07-14 (×3): qty 1000

## 2021-07-14 MED ORDER — ONDANSETRON HCL 4 MG PO TABS
4.0000 mg | ORAL_TABLET | Freq: Four times a day (QID) | ORAL | Status: DC | PRN
  Administered 2021-07-15: 4 mg via ORAL
  Filled 2021-07-14: qty 1

## 2021-07-14 MED ORDER — HEPARIN (PORCINE) 25000 UT/250ML-% IV SOLN
1300.0000 [IU]/h | INTRAVENOUS | Status: DC
  Administered 2021-07-14: 1300 [IU]/h via INTRAVENOUS
  Filled 2021-07-14: qty 250

## 2021-07-14 MED ORDER — ATORVASTATIN CALCIUM 40 MG PO TABS
40.0000 mg | ORAL_TABLET | Freq: Every day | ORAL | Status: DC
  Administered 2021-07-14 – 2021-07-17 (×4): 40 mg via ORAL
  Filled 2021-07-14 (×4): qty 1

## 2021-07-14 MED ORDER — HEPARIN BOLUS VIA INFUSION
3000.0000 [IU] | Freq: Once | INTRAVENOUS | Status: AC
  Administered 2021-07-14: 3000 [IU] via INTRAVENOUS

## 2021-07-14 MED ORDER — DILTIAZEM HCL 30 MG PO TABS
30.0000 mg | ORAL_TABLET | Freq: Four times a day (QID) | ORAL | Status: DC
  Administered 2021-07-14 – 2021-07-15 (×4): 30 mg via ORAL
  Filled 2021-07-14 (×4): qty 1

## 2021-07-14 NOTE — ED Triage Notes (Addendum)
Pt brought in by [facility] from home. EMS called out for fall, found pt to have increased SOB and to be hypotensive. Pt on 2L [place] at home since Dec 2022. Pt recently seen here for Pnu 3 days ago.   Pt given 586ml bolus NS by EMS.

## 2021-07-14 NOTE — TOC Progression Note (Signed)
°  Transition of Care [facility]) Screening Note   Patient Details  Name: [name] [name] Date of Birth: 1956-07-03   Transition of Care [facility]) CM/SW Contact:    Elliot Gault, LCSW Phone Number: 07/14/2021, 9:38 AM    Transition of Care Department [facility]) has reviewed patient and no TOC needs have been identified at this time.   Pt recently (07/09/21) discharged home from [facility] with [facility] and Home O2 arranged by TOC.   We will continue to monitor patient advancement through interdisciplinary progression rounds. If new patient transition needs arise, please place a TOC consult.

## 2021-07-14 NOTE — Progress Notes (Signed)
ANTICOAGULATION CONSULT NOTE - Follow Up Consult  Pharmacy Consult for heparin Indication: atrial fibrillation  Allergies  Allergen Reactions   Glyxambi [Empagliflozin-Linagliptin] Diarrhea and Itching    He was able to take Jardiance alone with no side effects so it was the Tradjenta part that most likely gave him the reaction   Hctz [Hydrochlorothiazide]     Nausea and headache    Sulfa Antibiotics Nausea And Vomiting   Sulfonamide Derivatives     Patient Measurements: Height: 5\' 10"  (177.8 cm) Weight: 84.7 kg (186 lb 11.7 oz) IBW/kg (Calculated) : 73  Vital Signs: Temp: 97.5 F (36.4 C) (01/09 0513) Temp Source: Oral (01/09 0513) BP: 117/70 (01/09 0630) Pulse Rate: 133 (01/09 0630)  Labs: Recent Labs    07/14/21 0500  HGB 13.8  HCT 41.6  PLT 275    Estimated Creatinine Clearance: 47.2 mL/min (A) (by C-G formula based on SCr of 1.59 mg/dL (H)).   Assessment: 66yo male c/o SOB and weakness w/ recent Covid-related PNA Dx, now found to be in Afib, to begin heparin.  Pt has bioprosthetic AVR, not on AC PTA.  Goal of Therapy:  Heparin level 0.3-0.7 units/ml Monitor platelets by anticoagulation protocol: Yes   Plan:  Heparin 3000 units IV bolus x1 followed by heparin infusion at 1300 units/hr and monitor heparin levels and CBC.  66yo, PharmD, BCPS  07/14/2021,6:58 AM

## 2021-07-14 NOTE — ED Provider Notes (Signed)
[facility] EMERGENCY DEPARTMENT Provider Note   CSN: [id number] Arrival date & time: 07/14/21  0449     History  Chief Complaint  Patient presents with   Fall   Diarrhea    [name] [name] is a 66 y.o. male.  The history is provided by the patient and the EMS personnel.  Fall  Diarrhea He has history of diabetes, coronary artery disease, ascending aortic aneurysm, status post aortic valve replacement, obstructive sleep apnea and was hospitalized with COVID-related pneumonia and was discharged 5 days ago.  2 days ago, he started having diarrhea and nausea and vomiting.  He also has noted that he is very weak and dizzy if he stands up.  He denies any blood in stool or emesis.  He denies fever chills or sweats.  He fell today, striking his head.  He was not aware of any abnormal heart rhythm and denies any chest pain.  He was brought in by ambulance.  EMS noted low blood pressure and gave him 500 mL of fluid with improvement.   Home Medications Prior to Admission medications   Medication Sig Start Date End Date Taking? Authorizing Provider  ascorbic acid (VITAMIN C) 500 MG tablet Take 1 tablet (500 mg total) by mouth daily. 07/10/21   Barton Dubois, MD  aspirin EC 81 MG tablet Take 81 mg by mouth daily.    [provider]  atenolol (TENORMIN) 25 MG tablet Take 1 tablet (25 mg total) by mouth daily. 05/07/21   Dettinger, Fransisca Kaufmann, MD  atorvastatin (LIPITOR) 40 MG tablet Take 1 tablet (40 mg total) by mouth daily. 05/07/21   Dettinger, Fransisca Kaufmann, MD  benzonatate (TESSALON) 200 MG capsule Take 1 capsule (200 mg total) by mouth 3 (three) times daily as needed. 06/23/21   Sharion Balloon, FNP  blood glucose meter kit and supplies KIT Dispense based on patient and insurance preference. Use up to four times daily as directed. (FOR ICD-9 250.00, 250.01). 07/29/20   Dettinger, Fransisca Kaufmann, MD  cholecalciferol (VITAMIN D) 1000 UNITS tablet Take 2,000 Units by mouth 2 (two) times daily.     [provider]  fexofenadine (ALLEGRA) 180 MG tablet Take 180 mg by mouth daily as needed.    [provider]  fluticasone (FLONASE) 50 MCG/ACT nasal spray Place 2 sprays into the nose daily as needed for allergies.  12/12/12   Chipper Herb, MD  glipiZIDE (GLUCOTROL) 5 MG tablet Take 2 tablets (10 mg total) by mouth 2 (two) times daily before a meal. 05/07/21   Dettinger, Fransisca Kaufmann, MD  insulin degludec (TRESIBA FLEXTOUCH) 100 UNIT/ML FlexTouch Pen Inject 20 Units into the skin daily.    [provider]  Insulin Pen Needle (GLOBAL EASE INJECT PEN NEEDLES) 31G X 5 MM MISC Use daily as directed Dx E11.69 01/27/21   Dettinger, Fransisca Kaufmann, MD  Ipratropium-Albuterol (COMBIVENT) 20-100 MCG/ACT AERS respimat Inhale 1 puff into the lungs every 6 (six) hours as needed for wheezing or shortness of breath. 07/09/21   Barton Dubois, MD  Lancets [facility] DELICA PLUS PVXYIA16P) MISC 4 (four) times daily. 07/29/20   [provider]  lisinopril (ZESTRIL) 20 MG tablet Take 1 tablet (20 mg total) by mouth daily. 05/07/21   Dettinger, Fransisca Kaufmann, MD  metFORMIN (GLUCOPHAGE) 1000 MG tablet Take 1 tablet (1,000 mg total) by mouth 2 (two) times daily with a meal. 05/07/21   Dettinger, Fransisca Kaufmann, MD  [facility] ULTRA test strip Test BS 4 times daily  Dx E11.69 11/18/20   Dettinger, Fransisca Kaufmann, MD  predniSONE (DELTASONE) 20 MG tablet Take 3 tablets by mouth daily x2 day; then 2 tablet by mouth daily x2 days; then 1 tablet by mouth daily x3 days; then half tablet by mouth daily x3 days and stop prednisone. 07/09/21   Barton Dubois, MD  promethazine (PHENERGAN) 25 MG tablet Take 1 tablet (25 mg total) by mouth every 8 (eight) hours as needed for nausea or vomiting. 07/02/21   Hassell Done, Mary-Margaret, FNP  promethazine-dextromethorphan (PROMETHAZINE-DM) 6.25-15 MG/5ML syrup Take 5 mLs by mouth 3 (three) times daily as needed for cough. 06/23/21   Hawks, Christy A, FNP  Semaglutide (RYBELSUS) 14 MG TABS Take 14  mg by mouth daily. 02/03/21   Dettinger, Fransisca Kaufmann, MD  Simethicone (GAS-X PO) Take 1 capsule by mouth daily as needed.    [provider]  temazepam (RESTORIL) 15 MG capsule TAKE ONE CAPSULE BY MOUTH AT BEDTIME AS NEEDED 02/03/21   Dettinger, Fransisca Kaufmann, MD  zinc sulfate 220 (50 Zn) MG capsule Take 1 capsule (220 mg total) by mouth daily. 07/10/21   Barton Dubois, MD      Allergies    Glyxambi [empagliflozin-linagliptin], Hctz [hydrochlorothiazide], Sulfa antibiotics, and Sulfonamide derivatives    Review of Systems   Review of Systems  Gastrointestinal:  Positive for diarrhea.  All other systems reviewed and are negative.  Physical Exam Updated Vital Signs BP (!) 116/98    Pulse (!) 129    Resp (!) 28    Ht $R'5\' 10"'Kp$  (1.778 m)    Wt 84.7 kg    SpO2 95%    BMI 26.79 kg/m  Physical Exam Vitals and nursing note reviewed.  Ill-appearing 66 year old male, resting comfortably and in no acute distress. Vital signs are significant for rapid heart rate and respiratory rate. Oxygen saturation is 95%, which is normal. Head is normocephalic.  Minor abrasions are noted on the forehead. PERRLA, EOMI. Oropharynx is clear. Neck is nontender and supple without adenopathy or JVD. Back is nontender and there is no CVA tenderness. Lungs are clear without rales, wheezes, or rhonchi. Chest is nontender. Heart is tachycardic and irregular without murmur. Abdomen is soft, flat, nontender without masses or hepatosplenomegaly and peristalsis is normoactive. Extremities have no cyanosis or edema, full range of motion is present. Skin is warm and dry without rash. Neurologic: Mental status is normal, cranial nerves are intact, moves all extremities equally.  ED Results / Procedures / Treatments   Labs (all labs ordered are listed, but only abnormal results are displayed) Labs Reviewed  URINALYSIS, ROUTINE W REFLEX MICROSCOPIC - Abnormal; Notable for the following components:      Result Value   APPearance  CLOUDY (*)    All other components within normal limits  LACTIC ACID, PLASMA - Abnormal; Notable for the following components:   Lactic Acid, Venous 5.0 (*)    All other components within normal limits  COMPREHENSIVE METABOLIC PANEL - Abnormal; Notable for the following components:   Potassium 5.7 (*)    Chloride 113 (*)    CO2 13 (*)    Glucose, Bld 124 (*)    BUN 98 (*)    Creatinine, Ser 2.22 (*)    Albumin 3.4 (*)    GFR, Estimated 32 (*)    All other components within normal limits  CBC WITH DIFFERENTIAL/PLATELET - Abnormal; Notable for the following components:   WBC 13.7 (*)    RBC 4.11 (*)  MCV 101.2 (*)    Neutro Abs 11.7 (*)    Monocytes Absolute 1.1 (*)    All other components within normal limits  HEPARIN LEVEL (UNFRACTIONATED)  TROPONIN I (HIGH SENSITIVITY)  TROPONIN I (HIGH SENSITIVITY)    EKG EKG Interpretation  Date/Time:  Monday July 14 2021 05:00:53 EST Ventricular Rate:  148 PR Interval:    QRS Duration: 98 QT Interval:  298 QTC Calculation: 468 R Axis:   6 Text Interpretation: Atrial fibrillation with rapid ventricular response ST depression, probably rate related When compared with ECG of 07/04/2021, Atrial fibrillation with rapid ventricular response has replaced Sinus rhythm Confirmed by Delora Fuel ([id number]) on 07/14/2021 5:08:08 AM   EKG Interpretation  Date/Time:  Monday July 14 2021 05:01:30 EST Ventricular Rate:  148 PR Interval:    QRS Duration: 104 QT Interval:  297 QTC Calculation: 452 R Axis:   40 Text Interpretation: Atrial fibrillation with rapid ventricular response Ventricular premature complex When compared with ECG of EARLIER SAME DATE No significant change was found Confirmed by Delora Fuel ([id number]) on 07/14/2021 5:10:06 AM        Radiology CT Head Wo Contrast  Result Date: 07/14/2021 CLINICAL DATA:  66 year old male status post fall. Hypotensive, shortness of breath. EXAM: CT HEAD WITHOUT CONTRAST TECHNIQUE: Contiguous  axial images were obtained from the base of the skull through the vertex without intravenous contrast. COMPARISON:  Report of [facility] head CT 09/08/2010 (no images available). FINDINGS: Brain: Cerebral volume is within normal limits for age. No midline shift, ventriculomegaly, mass effect, evidence of mass lesion, intracranial hemorrhage or evidence of cortically based acute infarction. Gray-white matter differentiation is within normal limits throughout the brain. No encephalomalacia identified. Vascular: Calcified atherosclerosis at the skull base. Dominant left vertebral artery. No suspicious intracranial vascular hyperdensity. Skull: No acute osseous abnormality identified.  Some osteopenia. Sinuses/Orbits: Trace fluid in the mastoid air cells, but tympanic cavities remain clear. Negative visible nasopharynx. Paranasal sinuses are clear. Other: Visualized orbit soft tissues are within normal limits. No definite scalp soft tissue injury. IMPRESSION: 1. No acute traumatic injury identified. Normal for age non contrast CT appearance of the brain. 2. Trace bilateral mastoid effusions, probably post inflammatory and significance doubtful. Electronically Signed   By: Genevie Ann M.D.   On: 07/14/2021 06:03   DG Chest Port 1 View  Result Date: 07/14/2021 CLINICAL DATA:  66 year old male with history of atrial fibrillation. Increasing shortness of breath. Hypotension. EXAM: PORTABLE CHEST 1 VIEW COMPARISON:  Chest x-ray 07/04/2021. FINDINGS: Multifocal interstitial and poorly defined airspace opacities are again noted in the left mid to lower lung. Right lung appears clear. No pneumothorax. No pleural effusions. No evidence of pulmonary edema. Heart size is normal. Upper mediastinal contours are within normal limits. Atherosclerotic calcifications are noted in the thoracic aorta. Status post median sternotomy for aortic valve replacement with what appears to be a stented bioprosthesis. IMPRESSION: 1.  Persistent bronchopneumonia in the left mid to lower lung, similar to the prior study. 2. Aortic atherosclerosis. 3. Status post aortic valve replacement. Electronically Signed   By: Vinnie Langton M.D.   On: 07/14/2021 05:31    Procedures Procedures  Monitor shows atrial fibrillation with rapid ventricular response per my interpretation.  Medications Ordered in ED Medications - No data to display  ED Course/ Medical Decision Making/ A&P  Medical Decision Making  Fall with hypotension in the setting of atrial fibrillation with rapid ventricular response.  He he has had some forehead abrasions and will need to be sent for CT of head.  However, more pressing issue is he will need control of his heart rate.  Because he is not aware of the rhythm, cannot be certain how long he has been in it and he is not a candidate for cardioversion.  Heparin will be withheld until I am certain there is no intracranial bleeding.  He will be given additional IV fluids.  Old records are reviewed confirming recent hospitalization for pneumonia in the setting of post COVID.  Chest x-ray is unchanged from x-ray from recent hospitalization.  CT shows no intracranial bleeding per my interpretation with radiologist interpretation pending.  Heart rate has decreased to 120, and blood pressure is adequate, will give additional metoprolol.  Radiologist agrees that there is no evidence of intracranial bleeding, heparin is initiated.  Labs show evidence of acute kidney injury.  Elevated lactic acid level was felt to represent hypovolemia and hypotension secondary to atrial fibrillation sepsis.  He will need to be admitted.  [facility] hospitalists have been paged.  CRITICAL CARE Performed by: Delora Fuel Total critical care time: 60 minutes Critical care time was exclusive of separately billable procedures and treating other patients. Critical care was necessary to treat or prevent imminent or  life-threatening deterioration. Critical care was time spent personally by me on the following activities: development of treatment plan with patient and/or surrogate as well as nursing, discussions with consultants, evaluation of patient's response to treatment, examination of patient, obtaining history from patient or surrogate, ordering and performing treatments and interventions, ordering and review of laboratory studies, ordering and review of radiographic studies, pulse oximetry and re-evaluation of patient's condition.       Final Clinical Impression(s) / ED Diagnoses Final diagnoses:  Atrial fibrillation with rapid ventricular response ([place])  Fall at home, initial encounter  Nausea vomiting and diarrhea  Elevated lactic acid level  Acute kidney injury (nontraumatic) [facility])    Rx / DC Orders ED Discharge Orders     None         Delora Fuel, MD [birth year] [phone]

## 2021-07-14 NOTE — H&P (Addendum)
History and Physical:    [name] [name]   WFU:[id number] DOB: August 03, 1955 DOA: 07/14/2021  Referring MD/provider: Davonna Belling, MD PCP: Dettinger, Fransisca Kaufmann, MD   Patient coming from: Home  Chief Complaint: Vomiting and diarrhea  History of Present Illness:   [name] [name] is a 66 y.o. male is a 66 year old man with medical history significant for CAD, aortic stenosis s/p bicuspid aortic valve replacement, ascending aortic aneurysm, history of atrial fibrillation before heart surgery 2 years ago (he said he has not had A. fib since heart surgery) diabetes, CKD stage IIIa, OSA not on CPAP, recent hospitalization from 07/04/2021 through 07/09/2021 for KGURK-27 pneumonia complicated by acute hypoxemic respiratory failure.  He presented to the hospital because of vomiting, diarrhea, generalized weakness, dizziness and fall.  He said since discharge from the hospital, he has been feeling weak and tired.  He started having vomiting and diarrhea about 3 days ago and that made him feel weaker.  He went to the bathroom this morning and he felt dizzy and fell hitting his forehead during the process.  No chest pain, shortness of breath, palpitations.  ED Course:  The patient was found to have A. fib with RVR and AKI and he was given IV fluid bolus, IV Zofran and IV heparin drip.  ROS:   ROS all other systems reviewed were negative  Past Medical History:   Past Medical History:  Diagnosis Date   Aortic stenosis 8/09   Bicuspid aortic valve   Arthritis 6/10   hand    Atrial fibrillation ([place])    post op only   Bile duct calculus with acute cholecystitis    CAD (coronary artery disease) 8/09   Calculus of common bile duct with acute pancreatitis 11/2016   CTS (carpal tunnel syndrome)    bilateral    Diabetes ([place])    Diastolic dysfunction 0/62   Elevated LFTs    Fatty liver disease, nonalcoholic    History of kidney stones    Hyperlipidemia    Hypertension    Kidney stones    Nasal  congestion    NIDDM (non-insulin dependent diabetes mellitus)    x15 yrs   OSA on CPAP 2006   Paresthesias in left hand 02/19/2004   Paresthesias in right hand 02/19/2004   Sinusitis    Vitamin D deficiency 06/13/09    Past Surgical History:   Past Surgical History:  Procedure Laterality Date   bladder cancer - operation - 01/21/18 - Dr Tresa Bencosme  01/21/2018   CARPAL TUNNEL RELEASE     bilateral    CHOLECYSTECTOMY  11/2016   COLONOSCOPY N/A 09/22/2018   Procedure: COLONOSCOPY;  Surgeon: Rogene Houston, MD;  Location: [facility];  Service: Endoscopy;  Laterality: N/A;  1:00   CORONARY ANGIOPLASTY WITH STENT PLACEMENT     to rt coronary atery (Dr. Degert-cardiologist)   [place], URETEROSCOPY AND STENT PLACEMENT Left 03/07/2018   Procedure: CYSTOSCOPY WITH RETROGRADE PYELOGRAM,  AND STENT PLACEMENT;  Surgeon: [name] Frock, MD;  Location: [facility];  Service: Urology;  Laterality: Left;   ERCP N/A 11/24/2016   Procedure: ENDOSCOPIC RETROGRADE CHOLANGIOPANCREATOGRAPHY (ERCP);  Surgeon: Rogene Houston, MD;  Location: [facility];  Service: Endoscopy;  Laterality: N/A;   EXTRACORPOREAL SHOCK WAVE LITHOTRIPSY Left 03/17/2018   Procedure: LEFT EXTRACORPOREAL SHOCK WAVE LITHOTRIPSY (ESWL);  Surgeon: Irine Seal, MD;  Location: [facility];  Service: Urology;  Laterality: Left;   REMOVAL OF STONES N/A 11/24/2016  History and Physical:    [name] [name]   WFU:[id number] DOB: August 03, 1955 DOA: 07/14/2021  Referring MD/provider: Davonna Belling, MD PCP: Dettinger, Fransisca Kaufmann, MD   Patient coming from: Home  Chief Complaint: Vomiting and diarrhea  History of Present Illness:   [name] [name] is a 66 y.o. male is a 66 year old man with medical history significant for CAD, aortic stenosis s/p bicuspid aortic valve replacement, ascending aortic aneurysm, history of atrial fibrillation before heart surgery 2 years ago (he said he has not had A. fib since heart surgery) diabetes, CKD stage IIIa, OSA not on CPAP, recent hospitalization from 07/04/2021 through 07/09/2021 for KGURK-27 pneumonia complicated by acute hypoxemic respiratory failure.  He presented to the hospital because of vomiting, diarrhea, generalized weakness, dizziness and fall.  He said since discharge from the hospital, he has been feeling weak and tired.  He started having vomiting and diarrhea about 3 days ago and that made him feel weaker.  He went to the bathroom this morning and he felt dizzy and fell hitting his forehead during the process.  No chest pain, shortness of breath, palpitations.  ED Course:  The patient was found to have A. fib with RVR and AKI and he was given IV fluid bolus, IV Zofran and IV heparin drip.  ROS:   ROS all other systems reviewed were negative  Past Medical History:   Past Medical History:  Diagnosis Date   Aortic stenosis 8/09   Bicuspid aortic valve   Arthritis 6/10   hand    Atrial fibrillation ([place])    post op only   Bile duct calculus with acute cholecystitis    CAD (coronary artery disease) 8/09   Calculus of common bile duct with acute pancreatitis 11/2016   CTS (carpal tunnel syndrome)    bilateral    Diabetes ([place])    Diastolic dysfunction 0/62   Elevated LFTs    Fatty liver disease, nonalcoholic    History of kidney stones    Hyperlipidemia    Hypertension    Kidney stones    Nasal  congestion    NIDDM (non-insulin dependent diabetes mellitus)    x15 yrs   OSA on CPAP 2006   Paresthesias in left hand 02/19/2004   Paresthesias in right hand 02/19/2004   Sinusitis    Vitamin D deficiency 06/13/09    Past Surgical History:   Past Surgical History:  Procedure Laterality Date   bladder cancer - operation - 01/21/18 - Dr Tresa Bencosme  01/21/2018   CARPAL TUNNEL RELEASE     bilateral    CHOLECYSTECTOMY  11/2016   COLONOSCOPY N/A 09/22/2018   Procedure: COLONOSCOPY;  Surgeon: Rogene Houston, MD;  [facility]: [facility];  Service: Endoscopy;  Laterality: N/A;  1:00   CORONARY ANGIOPLASTY WITH STENT PLACEMENT     to rt coronary atery (Dr. Degert-cardiologist)   [place], URETEROSCOPY AND STENT PLACEMENT Left 03/07/2018   Procedure: CYSTOSCOPY WITH RETROGRADE PYELOGRAM,  AND STENT PLACEMENT;  Surgeon: [name] Frock, MD;  Location: [facility];  Service: Urology;  Laterality: Left;   ERCP N/A 11/24/2016   Procedure: ENDOSCOPIC RETROGRADE CHOLANGIOPANCREATOGRAPHY (ERCP);  Surgeon: Rogene Houston, MD;  [facility];  Service: Endoscopy;  Laterality: N/A;   EXTRACORPOREAL SHOCK WAVE LITHOTRIPSY Left 03/17/2018   Procedure: LEFT EXTRACORPOREAL SHOCK WAVE LITHOTRIPSY (ESWL);  Surgeon: Irine Seal, MD;  Location: [facility];  Service: Urology;  Laterality: Left;   REMOVAL OF STONES N/A 11/24/2016  History and Physical:    [name] [name]   WFU:[id number] DOB: August 03, 1955 DOA: 07/14/2021  Referring MD/provider: Davonna Belling, MD PCP: Dettinger, Fransisca Kaufmann, MD   Patient coming from: Home  Chief Complaint: Vomiting and diarrhea  History of Present Illness:   [name] [name] is a 66 y.o. male is a 66 year old man with medical history significant for CAD, aortic stenosis s/p bicuspid aortic valve replacement, ascending aortic aneurysm, history of atrial fibrillation before heart surgery 2 years ago (he said he has not had A. fib since heart surgery) diabetes, CKD stage IIIa, OSA not on CPAP, recent hospitalization from 07/04/2021 through 07/09/2021 for KGURK-27 pneumonia complicated by acute hypoxemic respiratory failure.  He presented to the hospital because of vomiting, diarrhea, generalized weakness, dizziness and fall.  He said since discharge from the hospital, he has been feeling weak and tired.  He started having vomiting and diarrhea about 3 days ago and that made him feel weaker.  He went to the bathroom this morning and he felt dizzy and fell hitting his forehead during the process.  No chest pain, shortness of breath, palpitations.  ED Course:  The patient was found to have A. fib with RVR and AKI and he was given IV fluid bolus, IV Zofran and IV heparin drip.  ROS:   ROS all other systems reviewed were negative  Past Medical History:   Past Medical History:  Diagnosis Date   Aortic stenosis 8/09   Bicuspid aortic valve   Arthritis 6/10   hand    Atrial fibrillation ([place])    post op only   Bile duct calculus with acute cholecystitis    CAD (coronary artery disease) 8/09   Calculus of common bile duct with acute pancreatitis 11/2016   CTS (carpal tunnel syndrome)    bilateral    Diabetes ([place])    Diastolic dysfunction 0/62   Elevated LFTs    Fatty liver disease, nonalcoholic    History of kidney stones    Hyperlipidemia    Hypertension    Kidney stones    Nasal  congestion    NIDDM (non-insulin dependent diabetes mellitus)    x15 yrs   OSA on CPAP 2006   Paresthesias in left hand 02/19/2004   Paresthesias in right hand 02/19/2004   Sinusitis    Vitamin D deficiency 06/13/09    Past Surgical History:   Past Surgical History:  Procedure Laterality Date   bladder cancer - operation - 01/21/18 - Dr Tresa Bencosme  01/21/2018   CARPAL TUNNEL RELEASE     bilateral    CHOLECYSTECTOMY  11/2016   COLONOSCOPY N/A 09/22/2018   Procedure: COLONOSCOPY;  Surgeon: Rogene Houston, MD;  [facility];  Service: Endoscopy;  Laterality: N/A;  1:00   CORONARY ANGIOPLASTY WITH STENT PLACEMENT     to rt coronary atery (Dr. Degert-cardiologist)   [place], URETEROSCOPY AND STENT PLACEMENT Left 03/07/2018   Procedure: CYSTOSCOPY WITH RETROGRADE PYELOGRAM,  AND STENT PLACEMENT;  Surgeon: [name] Frock, MD;  Location: [facility];  Service: Urology;  Laterality: Left;   ERCP N/A 11/24/2016   Procedure: ENDOSCOPIC RETROGRADE CHOLANGIOPANCREATOGRAPHY (ERCP);  Surgeon: Rogene Houston, MD;  [facility];  Service: Endoscopy;  Laterality: N/A;   EXTRACORPOREAL SHOCK WAVE LITHOTRIPSY Left 03/17/2018   Procedure: LEFT EXTRACORPOREAL SHOCK WAVE LITHOTRIPSY (ESWL);  Surgeon: Irine Seal, MD;  Location: [facility];  Service: Urology;  Laterality: Left;   REMOVAL OF STONES N/A 11/24/2016  History and Physical:    [name] [name]   WFU:[id number] DOB: August 03, 1955 DOA: 07/14/2021  Referring MD/provider: Davonna Belling, MD PCP: Dettinger, Fransisca Kaufmann, MD   Patient coming from: Home  Chief Complaint: Vomiting and diarrhea  History of Present Illness:   [name] [name] is a 66 y.o. male is a 66 year old man with medical history significant for CAD, aortic stenosis s/p bicuspid aortic valve replacement, ascending aortic aneurysm, history of atrial fibrillation before heart surgery 2 years ago (he said he has not had A. fib since heart surgery) diabetes, CKD stage IIIa, OSA not on CPAP, recent hospitalization from 07/04/2021 through 07/09/2021 for KGURK-27 pneumonia complicated by acute hypoxemic respiratory failure.  He presented to the hospital because of vomiting, diarrhea, generalized weakness, dizziness and fall.  He said since discharge from the hospital, he has been feeling weak and tired.  He started having vomiting and diarrhea about 3 days ago and that made him feel weaker.  He went to the bathroom this morning and he felt dizzy and fell hitting his forehead during the process.  No chest pain, shortness of breath, palpitations.  ED Course:  The patient was found to have A. fib with RVR and AKI and he was given IV fluid bolus, IV Zofran and IV heparin drip.  ROS:   ROS all other systems reviewed were negative  Past Medical History:   Past Medical History:  Diagnosis Date   Aortic stenosis 8/09   Bicuspid aortic valve   Arthritis 6/10   hand    Atrial fibrillation ([place])    post op only   Bile duct calculus with acute cholecystitis    CAD (coronary artery disease) 8/09   Calculus of common bile duct with acute pancreatitis 11/2016   CTS (carpal tunnel syndrome)    bilateral    Diabetes ([place])    Diastolic dysfunction 0/62   Elevated LFTs    Fatty liver disease, nonalcoholic    History of kidney stones    Hyperlipidemia    Hypertension    Kidney stones    Nasal  congestion    NIDDM (non-insulin dependent diabetes mellitus)    x15 yrs   OSA on CPAP 2006   Paresthesias in left hand 02/19/2004   Paresthesias in right hand 02/19/2004   Sinusitis    Vitamin D deficiency 06/13/09    Past Surgical History:   Past Surgical History:  Procedure Laterality Date   bladder cancer - operation - 01/21/18 - Dr Tresa Bencosme  01/21/2018   CARPAL TUNNEL RELEASE     bilateral    CHOLECYSTECTOMY  11/2016   COLONOSCOPY N/A 09/22/2018   Procedure: COLONOSCOPY;  Surgeon: Rogene Houston, MD;  [facility];  Service: Endoscopy;  Laterality: N/A;  1:00   CORONARY ANGIOPLASTY WITH STENT PLACEMENT     to rt coronary atery (Dr. Degert-cardiologist)   [place], URETEROSCOPY AND STENT PLACEMENT Left 03/07/2018   Procedure: CYSTOSCOPY WITH RETROGRADE PYELOGRAM,  AND STENT PLACEMENT;  Surgeon: [name] Frock, MD;  Location: [facility];  Service: Urology;  Laterality: Left;   ERCP N/A 11/24/2016   Procedure: ENDOSCOPIC RETROGRADE CHOLANGIOPANCREATOGRAPHY (ERCP);  Surgeon: Rogene Houston, MD;  [facility];  Service: Endoscopy;  Laterality: N/A;   EXTRACORPOREAL SHOCK WAVE LITHOTRIPSY Left 03/17/2018   Procedure: LEFT EXTRACORPOREAL SHOCK WAVE LITHOTRIPSY (ESWL);  Surgeon: Irine Seal, MD;  Location: [facility];  Service: Urology;  Laterality: Left;   REMOVAL OF STONES N/A 11/24/2016  History and Physical:    [name] [name]   WFU:[id number] DOB: August 03, 1955 DOA: 07/14/2021  Referring MD/provider: Davonna Belling, MD PCP: Dettinger, Fransisca Kaufmann, MD   Patient coming from: Home  Chief Complaint: Vomiting and diarrhea  History of Present Illness:   [name] [name] is a 66 y.o. male is a 66 year old man with medical history significant for CAD, aortic stenosis s/p bicuspid aortic valve replacement, ascending aortic aneurysm, history of atrial fibrillation before heart surgery 2 years ago (he said he has not had A. fib since heart surgery) diabetes, CKD stage IIIa, OSA not on CPAP, recent hospitalization from 07/04/2021 through 07/09/2021 for KGURK-27 pneumonia complicated by acute hypoxemic respiratory failure.  He presented to the hospital because of vomiting, diarrhea, generalized weakness, dizziness and fall.  He said since discharge from the hospital, he has been feeling weak and tired.  He started having vomiting and diarrhea about 3 days ago and that made him feel weaker.  He went to the bathroom this morning and he felt dizzy and fell hitting his forehead during the process.  No chest pain, shortness of breath, palpitations.  ED Course:  The patient was found to have A. fib with RVR and AKI and he was given IV fluid bolus, IV Zofran and IV heparin drip.  ROS:   ROS all other systems reviewed were negative  Past Medical History:   Past Medical History:  Diagnosis Date   Aortic stenosis 8/09   Bicuspid aortic valve   Arthritis 6/10   hand    Atrial fibrillation ([place])    post op only   Bile duct calculus with acute cholecystitis    CAD (coronary artery disease) 8/09   Calculus of common bile duct with acute pancreatitis 11/2016   CTS (carpal tunnel syndrome)    bilateral    Diabetes ([place])    Diastolic dysfunction 0/62   Elevated LFTs    Fatty liver disease, nonalcoholic    History of kidney stones    Hyperlipidemia    Hypertension    Kidney stones    Nasal  congestion    NIDDM (non-insulin dependent diabetes mellitus)    x15 yrs   OSA on CPAP 2006   Paresthesias in left hand 02/19/2004   Paresthesias in right hand 02/19/2004   Sinusitis    Vitamin D deficiency 06/13/09    Past Surgical History:   Past Surgical History:  Procedure Laterality Date   bladder cancer - operation - 01/21/18 - Dr Tresa Bencosme  01/21/2018   CARPAL TUNNEL RELEASE     bilateral    CHOLECYSTECTOMY  11/2016   COLONOSCOPY N/A 09/22/2018   Procedure: COLONOSCOPY;  Surgeon: Rogene Houston, MD;  [facility];  Service: Endoscopy;  Laterality: N/A;  1:00   CORONARY ANGIOPLASTY WITH STENT PLACEMENT     to rt coronary atery (Dr. Degert-cardiologist)   [place], URETEROSCOPY AND STENT PLACEMENT Left 03/07/2018   Procedure: CYSTOSCOPY WITH RETROGRADE PYELOGRAM,  AND STENT PLACEMENT;  Surgeon: [name] Frock, MD;  Location: [facility];  Service: Urology;  Laterality: Left;   ERCP N/A 11/24/2016   Procedure: ENDOSCOPIC RETROGRADE CHOLANGIOPANCREATOGRAPHY (ERCP);  Surgeon: Rogene Houston, MD;  [facility];  Service: Endoscopy;  Laterality: N/A;   EXTRACORPOREAL SHOCK WAVE LITHOTRIPSY Left 03/17/2018   Procedure: LEFT EXTRACORPOREAL SHOCK WAVE LITHOTRIPSY (ESWL);  Surgeon: Irine Seal, MD;  Location: [facility];  Service: Urology;  Laterality: Left;   REMOVAL OF STONES N/A 11/24/2016

## 2021-07-15 LAB — BASIC METABOLIC PANEL
Anion gap: 7 (ref 5–15)
BUN: 75 mg/dL — ABNORMAL HIGH (ref 8–23)
CO2: 25 mmol/L (ref 22–32)
Calcium: 8.2 mg/dL — ABNORMAL LOW (ref 8.9–10.3)
Chloride: 103 mmol/L (ref 98–111)
Creatinine, Ser: 1.82 mg/dL — ABNORMAL HIGH (ref 0.61–1.24)
GFR, Estimated: 40 mL/min — ABNORMAL LOW (ref >60)
Glucose, Bld: 119 mg/dL — ABNORMAL HIGH (ref 70–99)
Potassium: 4.3 mmol/L (ref 3.5–5.1)
Sodium: 135 mmol/L (ref 135–145)

## 2021-07-15 LAB — CBC
HCT: 33.8 % — ABNORMAL LOW (ref 39.0–52.0)
Hemoglobin: 11.5 g/dL — ABNORMAL LOW (ref 13.0–17.0)
MCH: 33.1 pg (ref 26.0–34.0)
MCHC: 34 g/dL (ref 30.0–36.0)
MCV: 97.4 fL (ref 80.0–100.0)
Platelets: 180 10*3/uL (ref 150–400)
RBC: 3.47 MIL/uL — ABNORMAL LOW (ref 4.22–5.81)
RDW: 12.7 % (ref 11.5–15.5)
WBC: 7.7 10*3/uL (ref 4.0–10.5)
nRBC: 0 % (ref 0.0–0.2)

## 2021-07-15 LAB — GLUCOSE, CAPILLARY: Glucose-Capillary: 141 mg/dL — ABNORMAL HIGH (ref 70–99)

## 2021-07-15 MED ORDER — DILTIAZEM HCL ER COATED BEADS 180 MG PO CP24
180.0000 mg | ORAL_CAPSULE | Freq: Every day | ORAL | Status: DC
  Administered 2021-07-15 – 2021-07-17 (×3): 180 mg via ORAL
  Filled 2021-07-15 (×3): qty 1

## 2021-07-15 MED ORDER — APIXABAN 5 MG PO TABS
5.0000 mg | ORAL_TABLET | Freq: Two times a day (BID) | ORAL | Status: DC
  Administered 2021-07-15 – 2021-07-17 (×5): 5 mg via ORAL
  Filled 2021-07-15 (×5): qty 1

## 2021-07-15 MED ORDER — LACTATED RINGERS IV SOLN: INTRAVENOUS | Status: AC

## 2021-07-15 NOTE — Progress Notes (Signed)
Pt arrived to room 337 from ICU.Oriented to room and safety procedures. States understanding. Bed alarm on for safety, urinal and call bell at bedside.

## 2021-07-15 NOTE — Progress Notes (Addendum)
Progress Note    [name] [name]  NFA:[id number] DOB: 07-12-1955  DOA: 07/14/2021 PCP: Dettinger, [name], MD      Brief Narrative:    Medical records reviewed and are as summarized below:  [name] [name] is a 66 y.o. male  with medical history significant for CAD, aortic stenosis s/p bicuspid aortic valve replacement, ascending aortic aneurysm, history of atrial fibrillation before heart surgery 2 years ago (he said he has not had A. fib since heart surgery) diabetes, CKD stage IIIa, OSA not on CPAP, recent hospitalization from 07/04/2021 through 07/09/2021 for COVID-19 pneumonia complicated by acute hypoxemic respiratory failure.  He presented to the hospital because of vomiting, diarrhea, generalized weakness, dizziness and fall.      Assessment/Plan:   Principal Problem:   AKI (acute kidney injury) (HCC) Active Problems:   Atrial fibrillation with RVR (HCC)   Hyperkalemia   Metabolic acidosis   Vomiting and diarrhea     Body mass index is 25.84 kg/m.     Atrial fibrillation with RVR: Overall, heart rate has improved.  He was started on short acting Cardizem.  Change to long-acting Cardizem today.  IV heparin drip has been discontinued and he has been started on Eliquis for stroke prophylaxis.  Risk and benefits and alternatives to long-term anticoagulants were discussed and he agrees to proceed with treatment with Eliquis.  He has been advised to stop aspirin for now.  Outpatient follow-up with cardiologist was recommended.  AKI complicated by hyperkalemia and metabolic acidosis, underlying CKD stage IIIa: Hyperkalemia and metabolic acidosis resolved.  Creatinine is not at baseline.  Change IV sodium bicarbonate infusion to IV Ringer's lactate infusion.  Monitor BMP.    Vomiting and diarrhea: Improved.    CAD, s/p bicuspid aortic valve replacement: He has been started on Eliquis   Generalized weakness, s/p fall at home: PT valuation.   Recent COVID-19 infection  with acute hypoxemic respiratory failure: He still has persistent infiltrates on chest x-ray from recent pneumonia.  No indication for antibiotics at this time.  Transfer from stepdown unit to telemetry.  Diet Order             Diet Carb Modified Fluid consistency: Thin; Room service appropriate? Yes  Diet effective now                      Consultants: None   Procedures: None    Medications:    apixaban  5 mg Oral BID   atorvastatin  40 mg Oral Daily   Chlorhexidine Gluconate Cloth  6 each Topical Daily   diltiazem  30 mg Oral Q6H   mouth rinse  15 mL Mouth Rinse BID   Continuous Infusions:  lactated ringers 75 mL/hr at 07/15/21 0839     Anti-infectives (From admission, onward)    None              Family Communication/Anticipated D/C date and plan/Code Status   DVT prophylaxis: SCDs Start: 07/14/21 1110 apixaban (ELIQUIS) tablet 5 mg     Code Status: Full Code  Family Communication: None Disposition Plan:   Plan to discharge home in 1 to 2 days   Status is: Inpatient  Remains inpatient appropriate because: IV fluids           Subjective:   Interval events noted.  No vomiting, diarrhea or abdominal pain.  No shortness of breath or chest pain.  He feels a little better today.  Objective:  Vitals:   07/15/21 0700 07/15/21 0946 07/15/21 1000 07/15/21 1110  BP: (!) 94/58 (!) 105/57 (!) 111/46 120/80  Pulse: 80 90 61 71  Resp: 19 18 19 18   Temp:      TempSrc:      SpO2: 98% 99% 98% 99%  Weight:      Height:       No data found.   Intake/Output Summary (Last 24 hours) at 07/15/2021 1612 Last data filed at 07/15/2021 1000 Gross per 24 hour  Intake 2018.89 ml  Output 1000 ml  Net 1018.89 ml   Filed Weights   07/14/21 0454 07/14/21 1000 07/15/21 0500  Weight: 84.7 kg 81.3 kg 81.7 kg    Exam:  GEN: NAD SKIN: No rash EYES: EOMI ENT: MMM CV: Irregular rate and rhythm, slightly tachycardic PULM: CTA B ABD:  soft, ND, NT, +BS CNS: AAO x 3, non focal EXT: No edema or tenderness        Data Reviewed:   I have personally reviewed following labs and imaging studies:  Labs: Labs show the following:   Basic Metabolic Panel: Recent Labs  Lab 07/09/21 0630 07/14/21 0500 07/15/21 0421  NA 136 139 135  K 5.0 5.7* 4.3  CL 107 113* 103  CO2 18* 13* 25  GLUCOSE 213* 124* 119*  BUN 65* 98* 75*  CREATININE 1.59* 2.22* 1.82*  CALCIUM 9.3 9.3 8.2*   GFR Estimated Creatinine Clearance: 41.2 mL/min (A) (by C-G formula based on SCr of 1.82 mg/dL (H)). Liver Function Tests: Recent Labs  Lab 07/09/21 0630 07/14/21 0500  AST 28 25  ALT 47* 39  ALKPHOS 64 71  BILITOT 0.7 0.7  PROT 7.3 7.2  ALBUMIN 3.4* 3.4*   No results for input(s): LIPASE, AMYLASE in the last 168 hours. No results for input(s): AMMONIA in the last 168 hours. Coagulation profile No results for input(s): INR, PROTIME in the last 168 hours.  CBC: Recent Labs  Lab 07/09/21 0630 07/14/21 0500 07/15/21 0421  WBC 9.6 13.7* 7.7  NEUTROABS 8.6* 11.7*  --   HGB 11.8* 13.8 11.5*  HCT 35.5* 41.6 33.8*  MCV 97.3 101.2* 97.4  PLT 300 275 180   Cardiac Enzymes: No results for input(s): CKTOTAL, CKMB, CKMBINDEX, TROPONINI in the last 168 hours. BNP (last 3 results) No results for input(s): PROBNP in the last 8760 hours. CBG: Recent Labs  Lab 07/08/21 1654 07/08/21 2146 07/09/21 0713 07/09/21 1129  GLUCAP 268* 140* 215* 313*   D-Dimer: No results for input(s): DDIMER in the last 72 hours. Hgb A1c: No results for input(s): HGBA1C in the last 72 hours. Lipid Profile: No results for input(s): CHOL, HDL, LDLCALC, TRIG, CHOLHDL, LDLDIRECT in the last 72 hours. Thyroid function studies: No results for input(s): TSH, T4TOTAL, T3FREE, THYROIDAB in the last 72 hours.  Invalid input(s): FREET3 Anemia work up: No results for input(s): VITAMINB12, FOLATE, FERRITIN, TIBC, IRON, RETICCTPCT in the last 72 hours. Sepsis  Labs: Recent Labs  Lab 07/09/21 0630 07/14/21 0500 07/14/21 0605 07/14/21 0803 07/15/21 0421  WBC 9.6 13.7*  --   --  7.7  LATICACIDVEN  --   --  5.0* 2.7*  --     Microbiology Recent Results (from the past 240 hour(s))  Culture, blood (routine x 2)     Status: None (Preliminary result)   Collection Time: 07/14/21  8:02 AM   Specimen: BLOOD LEFT HAND  Result Value Ref Range Status   Specimen Description BLOOD LEFT HAND  Final  Special Requests   Final    BOTTLES DRAWN AEROBIC AND ANAEROBIC Blood Culture adequate volume   Culture   Final    NO GROWTH 1 DAY Performed at [facility], [address], [place], [place] [address]    Report Status PENDING  Incomplete  Culture, blood (routine x 2)     Status: None (Preliminary result)   Collection Time: 07/14/21  8:13 AM   Specimen: BLOOD LEFT FOREARM  Result Value Ref Range Status   Specimen Description BLOOD LEFT FOREARM  Final   Special Requests   Final    BOTTLES DRAWN AEROBIC AND ANAEROBIC Blood Culture results may not be optimal due to an excessive volume of blood received in culture bottles   Culture   Final    NO GROWTH 1 DAY Performed at [facility], [address] [place], [place] [address]    Report Status PENDING  Incomplete  MRSA Next Gen by PCR, Nasal     Status: None   Collection Time: 07/14/21 10:38 AM   Specimen: Nasal Mucosa; Nasal Swab  Result Value Ref Range Status   MRSA by PCR Next Gen NOT DETECTED NOT DETECTED Final    Comment: (NOTE) The GeneXpert MRSA Assay (FDA approved for NASAL specimens only), is one component of a comprehensive MRSA colonization surveillance program. It is not intended to diagnose MRSA infection nor to guide or monitor treatment for MRSA infections. Test performance is not FDA approved in patients less than 72 years old. Performed at [facility], [address], [place], [place] [address]     Procedures and diagnostic studies:  CT Head Wo Contrast  Result Date:  07/14/2021 CLINICAL DATA:  66 year old male status post fall. Hypotensive, shortness of breath. EXAM: CT HEAD WITHOUT CONTRAST TECHNIQUE: Contiguous axial images were obtained from the base of the skull through the vertex without intravenous contrast. COMPARISON:  Report of [facility] head CT 09/08/2010 (no images available). FINDINGS: Brain: Cerebral volume is within normal limits for age. No midline shift, ventriculomegaly, mass effect, evidence of mass lesion, intracranial hemorrhage or evidence of cortically based acute infarction. Gray-white matter differentiation is within normal limits throughout the brain. No encephalomalacia identified. Vascular: Calcified atherosclerosis at the skull base. Dominant left vertebral artery. No suspicious intracranial vascular hyperdensity. Skull: No acute osseous abnormality identified.  Some osteopenia. Sinuses/Orbits: Trace fluid in the mastoid air cells, but tympanic cavities remain clear. Negative visible nasopharynx. Paranasal sinuses are clear. Other: Visualized orbit soft tissues are within normal limits. No definite scalp soft tissue injury. IMPRESSION: 1. No acute traumatic injury identified. Normal for age non contrast CT appearance of the brain. 2. Trace bilateral mastoid effusions, probably post inflammatory and significance doubtful. Electronically Signed   By: Odessa Fleming M.D.   On: 07/14/2021 06:03   DG Chest Port 1 View  Result Date: 07/14/2021 CLINICAL DATA:  66 year old male with history of atrial fibrillation. Increasing shortness of breath. Hypotension. EXAM: PORTABLE CHEST 1 VIEW COMPARISON:  Chest x-ray 07/04/2021. FINDINGS: Multifocal interstitial and poorly defined airspace opacities are again noted in the left mid to lower lung. Right lung appears clear. No pneumothorax. No pleural effusions. No evidence of pulmonary edema. Heart size is normal. Upper mediastinal contours are within normal limits. Atherosclerotic calcifications are noted in  the thoracic aorta. Status post median sternotomy for aortic valve replacement with what appears to be a stented bioprosthesis. IMPRESSION: 1. Persistent bronchopneumonia in the left mid to lower lung, similar to the prior study. 2. Aortic  atherosclerosis. 3. Status post aortic valve replacement. Electronically Signed   By: Trudie Reed M.D.   On: 07/14/2021 05:31               LOS: 1 day   Trason Shifflet  [facility]   Pager on [email or web address]. If 7PM-7AM, please contact night-coverage at [email or web address]     07/15/2021, 4:12 PM

## 2021-07-16 ENCOUNTER — Ambulatory Visit (INDEPENDENT_AMBULATORY_CARE_PROVIDER_SITE_OTHER): Payer: HMO

## 2021-07-16 DIAGNOSIS — J9601 Acute respiratory failure with hypoxia: Secondary | ICD-10-CM | POA: Diagnosis not present

## 2021-07-16 DIAGNOSIS — I2510 Atherosclerotic heart disease of native coronary artery without angina pectoris: Secondary | ICD-10-CM

## 2021-07-16 DIAGNOSIS — E559 Vitamin D deficiency, unspecified: Secondary | ICD-10-CM

## 2021-07-16 DIAGNOSIS — J1282 Pneumonia due to coronavirus disease 2019: Secondary | ICD-10-CM

## 2021-07-16 DIAGNOSIS — I4891 Unspecified atrial fibrillation: Secondary | ICD-10-CM

## 2021-07-16 DIAGNOSIS — E1165 Type 2 diabetes mellitus with hyperglycemia: Secondary | ICD-10-CM

## 2021-07-16 DIAGNOSIS — I119 Hypertensive heart disease without heart failure: Secondary | ICD-10-CM

## 2021-07-16 DIAGNOSIS — I7121 Aneurysm of the ascending aorta, without rupture: Secondary | ICD-10-CM

## 2021-07-16 DIAGNOSIS — M19049 Primary osteoarthritis, unspecified hand: Secondary | ICD-10-CM

## 2021-07-16 DIAGNOSIS — E785 Hyperlipidemia, unspecified: Secondary | ICD-10-CM

## 2021-07-16 DIAGNOSIS — K760 Fatty (change of) liver, not elsewhere classified: Secondary | ICD-10-CM | POA: Diagnosis not present

## 2021-07-16 DIAGNOSIS — G4733 Obstructive sleep apnea (adult) (pediatric): Secondary | ICD-10-CM

## 2021-07-16 DIAGNOSIS — Z9981 Dependence on supplemental oxygen: Secondary | ICD-10-CM

## 2021-07-16 DIAGNOSIS — Z8616 Personal history of COVID-19: Secondary | ICD-10-CM

## 2021-07-16 DIAGNOSIS — K219 Gastro-esophageal reflux disease without esophagitis: Secondary | ICD-10-CM

## 2021-07-16 LAB — BASIC METABOLIC PANEL
Anion gap: 8 (ref 5–15)
BUN: 43 mg/dL — ABNORMAL HIGH (ref 8–23)
CO2: 26 mmol/L (ref 22–32)
Calcium: 8.4 mg/dL — ABNORMAL LOW (ref 8.9–10.3)
Chloride: 102 mmol/L (ref 98–111)
Creatinine, Ser: 1.42 mg/dL — ABNORMAL HIGH (ref 0.61–1.24)
GFR, Estimated: 54 mL/min — ABNORMAL LOW (ref >60)
Glucose, Bld: 126 mg/dL — ABNORMAL HIGH (ref 70–99)
Potassium: 4.5 mmol/L (ref 3.5–5.1)
Sodium: 136 mmol/L (ref 135–145)

## 2021-07-16 LAB — CBC WITH DIFFERENTIAL/PLATELET
Abs Immature Granulocytes: 0.03 10*3/uL (ref 0.00–0.07)
Basophils Absolute: 0 10*3/uL (ref 0.0–0.1)
Basophils Relative: 0 %
Eosinophils Absolute: 0.1 10*3/uL (ref 0.0–0.5)
Eosinophils Relative: 1 %
HCT: 33.3 % — ABNORMAL LOW (ref 39.0–52.0)
Hemoglobin: 11.2 g/dL — ABNORMAL LOW (ref 13.0–17.0)
Immature Granulocytes: 0 %
Lymphocytes Relative: 12 %
Lymphs Abs: 0.8 10*3/uL (ref 0.7–4.0)
MCH: 32 pg (ref 26.0–34.0)
MCHC: 33.6 g/dL (ref 30.0–36.0)
MCV: 95.1 fL (ref 80.0–100.0)
Monocytes Absolute: 0.6 10*3/uL (ref 0.1–1.0)
Monocytes Relative: 8 %
Neutro Abs: 5.4 10*3/uL (ref 1.7–7.7)
Neutrophils Relative %: 79 %
Platelets: 139 10*3/uL — ABNORMAL LOW (ref 150–400)
RBC: 3.5 MIL/uL — ABNORMAL LOW (ref 4.22–5.81)
RDW: 12.1 % (ref 11.5–15.5)
WBC: 6.9 10*3/uL (ref 4.0–10.5)
nRBC: 0 % (ref 0.0–0.2)

## 2021-07-16 MED ORDER — SODIUM CHLORIDE 0.9 % IV BOLUS
1000.0000 mL | Freq: Once | INTRAVENOUS | Status: AC
Start: 2021-07-16 — End: 2021-07-16
  Administered 2021-07-16: 1000 mL via INTRAVENOUS

## 2021-07-16 MED ORDER — SODIUM CHLORIDE 0.9 % IV SOLN: INTRAVENOUS | Status: AC

## 2021-07-16 MED ORDER — ENSURE ENLIVE PO LIQD
237.0000 mL | Freq: Two times a day (BID) | ORAL | Status: DC
  Administered 2021-07-17: 237 mL via ORAL

## 2021-07-16 NOTE — Evaluation (Addendum)
Physical Therapy Evaluation Patient Details Name: [name] [name] MRN: [id number] DOB: 28-Dec-1955 Today's Date: 07/16/2021  History of Present Illness  [name] [name] is a 66 y.o. male is a 66 year old man with medical history significant for CAD, aortic stenosis s/p bicuspid aortic valve replacement, ascending aortic aneurysm, history of atrial fibrillation before heart surgery 2 years ago (he said he has not had A. fib since heart surgery) diabetes, CKD stage IIIa, OSA not on CPAP, recent hospitalization from 07/04/2021 through 07/09/2021 for [id number] pneumonia complicated by acute hypoxemic respiratory failure.  He presented to the hospital because of vomiting, diarrhea, generalized weakness, dizziness and fall.  He said since discharge from the hospital, he has been feeling weak and tired.  He started having vomiting and diarrhea about 3 days ago and that made him feel weaker.  He went to the bathroom this morning and he felt dizzy and fell hitting his forehead during the process.  No chest pain, shortness of breath, palpitations.   Clinical Impression  Patient functioning near baseline for functional mobility and gait demonstrating good return for ambulating in room without loss of balance, limited mostly due to fatigue and SpO2 maintained above 90% while on 1 LPM O2.  Patient tolerated sitting up at bedside after therapy with his spouse present in room.  Plan:  Patient discharged from physical therapy to care of nursing for ambulation daily as tolerated for length of stay.         Recommendations for follow up therapy are one component of a multi-disciplinary discharge planning process, led by the attending physician.  Recommendations may be updated based on patient status, additional functional criteria and insurance authorization.  Follow Up Recommendations Home health PT    Assistance Recommended at Discharge Intermittent Supervision/Assistance  Patient can return home with the following  A  little help with bathing/dressing/bathroom;A little help with walking and/or transfers    Equipment Recommendations None recommended by PT  Recommendations for Other Services       Functional Status Assessment Patient has had a recent decline in their functional status and demonstrates the ability to make significant improvements in function in a reasonable and predictable amount of time.     Precautions / Restrictions Precautions Precautions: Fall Restrictions Weight Bearing Restrictions: No      Mobility  Bed Mobility Overal bed mobility: Modified Independent                  Transfers Overall transfer level: Needs assistance Equipment used: None Transfers: Sit to/from Stand;Bed to chair/wheelchair/BSC Sit to Stand: Supervision   Step pivot transfers: Supervision       General transfer comment: increased time, slightly labored movement    Ambulation/Gait Ambulation/Gait assistance: Supervision Gait Distance (Feet): 40 Feet Assistive device: None Gait Pattern/deviations: Decreased step length - right;Decreased step length - left;Decreased stride length Gait velocity: decreased     General Gait Details: slightly labored slow cadence without loss of balance, SpO2 dropped from 92% to 85% on room air, put back on 1 LPM with SpO2 remaining above 90%, limited mostly due to fatigue  Stairs            Wheelchair Mobility    Modified Rankin (Stroke Patients Only)       Balance Overall balance assessment: No apparent balance deficits (not formally assessed)  Pertinent Vitals/Pain Pain Assessment: No/denies pain    Home Living Family/patient expects to be discharged to:: Private residence Living Arrangements: Spouse/significant other Available Help at Discharge: Family;Available 24 hours/day Type of Home: House Home Access: Stairs to enter Entrance Stairs-Rails: None Entrance  Stairs-Number of Steps: 2-3   Home Layout: One level Home Equipment: Conservation officer, nature (2 wheels);Cane - single point;BSC/3in1;Grab bars - tub/shower      Prior Function Prior Level of Function : Needs assist       Physical Assist : Mobility (physical);ADLs (physical) Mobility (physical): Bed mobility;Transfers;Gait;Stairs   Mobility Comments: Household and short distanced community ambulator without AD, was driving a few months ago ADLs Comments: assisted by family     Hand Dominance        Extremity/Trunk Assessment   Upper Extremity Assessment Upper Extremity Assessment: Overall WFL for tasks assessed    Lower Extremity Assessment Lower Extremity Assessment: Generalized weakness    Cervical / Trunk Assessment Cervical / Trunk Assessment: Normal  Communication   Communication: HOH  Cognition Arousal/Alertness: Awake/alert Behavior During Therapy: WFL for tasks assessed/performed Overall Cognitive Status: Within Functional Limits for tasks assessed                                          General Comments      Exercises     Assessment/Plan    PT Assessment All further PT needs can be met in the next venue of care  PT Problem List Decreased strength;Decreased activity tolerance;Decreased balance;Decreased mobility       PT Treatment Interventions      PT Goals (Current goals can be found in the Care Plan section)  Acute Rehab PT Goals Patient Stated Goal: return home with family to assist PT Goal Formulation: With patient/family Time For Goal Achievement: 07/16/21 Potential to Achieve Goals: Good    Frequency       Co-evaluation               AM-PAC PT "6 Clicks" Mobility  Outcome Measure Help needed turning from your back to your side while in a flat bed without using bedrails?: None Help needed moving from lying on your back to sitting on the side of a flat bed without using bedrails?: None Help needed moving to and from a  bed to a chair (including a wheelchair)?: A Little Help needed standing up from a chair using your arms (e.g., wheelchair or bedside chair)?: None Help needed to walk in hospital room?: A Little Help needed climbing 3-5 steps with a railing? : A Little 6 Click Score: 21    End of Session Equipment Utilized During Treatment: Oxygen Activity Tolerance: Patient tolerated treatment well;Patient limited by fatigue Patient left: in bed;with call bell/phone within reach;with family/visitor present Nurse Communication: Mobility status PT Visit Diagnosis: Unsteadiness on feet (R26.81);Other abnormalities of gait and mobility (R26.89);Muscle weakness (generalized) (M62.81)    Time: [id number] PT Time Calculation (min) (ACUTE ONLY): 16 min   Charges:   PT Evaluation $PT Eval Low Complexity: 1 Low PT Treatments $Therapeutic Activity: 8-22 mins        2:56 PM, 07/16/21 Lonell Grandchild, MPT Physical Therapist with [facility] [phone] office [phone] mobile phone

## 2021-07-16 NOTE — TOC Progression Note (Signed)
Transition of Care [facility]) - Progression Note    Patient Details  Name: [name] [name] MRN: [id number] Date of Birth: April 07, 1956  Transition of Care [facility]) CM/SW Contact  Salome Arnt, [place] Phone Number: 07/16/2021, 4:04 PM  Clinical Narrative:  Possible d/c today. MD notified of need for HHPT/RN resumption orders. Vaughan Basta with [place] updated.            Expected Discharge Plan and Services                                                 Social Determinants of Health (SDOH) Interventions    Readmission Risk Interventions Readmission Risk Prevention Plan 07/07/2021  Transportation Screening Complete  Home Care Screening Complete  Medication Review (RN CM) Complete  Some recent data might be hidden

## 2021-07-16 NOTE — Progress Notes (Signed)
Progress Note    [name] GWYNN  LOV:[id number] DOB: December 15, 1955  DOA: 07/14/2021 PCP: Dettinger, [name], MD      Brief Narrative:    Medical records reviewed and are as summarized below:  [name] [name] is a 66 y.o. male  with medical history significant for CAD, aortic stenosis s/p bicuspid aortic valve replacement, ascending aortic aneurysm, history of atrial fibrillation before heart surgery 2 years ago (he said he has not had A. fib since heart surgery) diabetes, CKD stage IIIa, OSA not on CPAP, recent hospitalization from 07/04/2021 through 07/09/2021 for COVID-19 pneumonia complicated by acute hypoxemic respiratory failure.  He presented to the hospital because of vomiting, diarrhea, generalized weakness, dizziness and fall.      Assessment/Plan:   Principal Problem:   AKI (acute kidney injury) (HCC) Active Problems:   Atrial fibrillation with RVR (HCC)   Hyperkalemia   Metabolic acidosis   Vomiting and diarrhea     Body mass index is 27.55 kg/m.     Atrial fibrillation with RVR:  -Continue Cardizem for rate control and Eliquis for stroke prophylaxis.   -Aspirin discontinued  outpatient follow-up with cardiologist was recommended.  AKI complicated by hyperkalemia and metabolic acidosis, underlying CKD stage IIIa: Hyperkalemia and metabolic acidosis resolved. -Renal function improving the patient is quite dizzy with hypotension given IV fluids  Vomiting and diarrhea: Resolved, appears dehydrated with soft BP needs IV fluid  CAD, s/p bicuspid aortic valve replacement: He has been started on Eliquis -Hypotension noted, get echo   Generalized weakness, s/p fall at home: PT valuation.   Recent COVID-19 infection with acute hypoxemic respiratory failure: He still has persistent infiltrates on chest x-ray from recent pneumonia.   -Hypoxia improving currently on 2 to 3 L of oxygen via nasal cannula  Diet Order             Diet Carb Modified Fluid  consistency: Thin; Room service appropriate? Yes  Diet effective now                   Consultants: None   Procedures: None    Medications:    apixaban  5 mg Oral BID   atorvastatin  40 mg Oral Daily   Chlorhexidine Gluconate Cloth  6 each Topical Daily   diltiazem  180 mg Oral Daily   [START ON 07/17/2021] feeding supplement  237 mL Oral BID BM   mouth rinse  15 mL Mouth Rinse BID   Continuous Infusions:  sodium chloride       Anti-infectives (From admission, onward)    None        Family Communication/Anticipated D/C date and plan/Code Status   DVT prophylaxis: SCDs Start: 07/14/21 1110 apixaban (ELIQUIS) tablet 5 mg     Code Status: Full Code  Family Communication: None Disposition Plan:   Plan to discharge home in 1 to 2 days   Status is: Inpatient  Remains inpatient appropriate because: IV fluids    Subjective:   -Wife at bedside, patient with dizziness, BP low with  BP down to 65/37 -No palpitations, no chest pains -Patient admits to poor oral intake  Objective:    Vitals:   07/16/21 0607 07/16/21 0631 07/16/21 1408 07/16/21 1409  BP: 107/68  (!) 65/37 (!) 86/65  Pulse: 89  71 75  Resp: 19  20 20   Temp: 97.7 F (36.5 C)  98.5 F (36.9 C)   TempSrc: Oral  Oral   SpO2: 97%  100% 100%  Weight:  87.1 kg    Height:       No data found.   Intake/Output Summary (Last 24 hours) at 07/16/2021 1910 Last data filed at 07/16/2021 0736 Gross per 24 hour  Intake 240 ml  Output 400 ml  Net -160 ml   Filed Weights   07/14/21 1000 07/15/21 0500 07/16/21 0631  Weight: 81.3 kg 81.7 kg 87.1 kg    Exam:  GEN: NAD SKIN: No rash EYES: EOMI ENT: MMM CV: Irregular rate and rhythm, slightly tachycardic PULM: CTA B ABD: soft, ND, NT, +BS CNS: AAO x 3, non focal EXT: No edema or tenderness        Data Reviewed:   I have personally reviewed following labs and imaging studies:  Labs: Labs show the following:   Basic Metabolic  Panel: Recent Labs  Lab 07/14/21 0500 07/15/21 0421 07/16/21 0538  NA 139 135 136  K 5.7* 4.3 4.5  CL 113* 103 102  CO2 13* 25 26  GLUCOSE 124* 119* 126*  BUN 98* 75* 43*  CREATININE 2.22* 1.82* 1.42*  CALCIUM 9.3 8.2* 8.4*   GFR Estimated Creatinine Clearance: 52.8 mL/min (A) (by C-G formula based on SCr of 1.42 mg/dL (H)). Liver Function Tests: Recent Labs  Lab 07/14/21 0500  AST 25  ALT 39  ALKPHOS 71  BILITOT 0.7  PROT 7.2  ALBUMIN 3.4*   No results for input(s): LIPASE, AMYLASE in the last 168 hours. No results for input(s): AMMONIA in the last 168 hours. Coagulation profile No results for input(s): INR, PROTIME in the last 168 hours.  CBC: Recent Labs  Lab 07/14/21 0500 07/15/21 0421 07/16/21 0538  WBC 13.7* 7.7 6.9  NEUTROABS 11.7*  --  5.4  HGB 13.8 11.5* 11.2*  HCT 41.6 33.8* 33.3*  MCV 101.2* 97.4 95.1  PLT 275 180 139*   Cardiac Enzymes: No results for input(s): CKTOTAL, CKMB, CKMBINDEX, TROPONINI in the last 168 hours. BNP (last 3 results) No results for input(s): PROBNP in the last 8760 hours. CBG: Recent Labs  Lab 07/15/21 1630  GLUCAP 141*   D-Dimer: No results for input(s): DDIMER in the last 72 hours. Hgb A1c: No results for input(s): HGBA1C in the last 72 hours. Lipid Profile: No results for input(s): CHOL, HDL, LDLCALC, TRIG, CHOLHDL, LDLDIRECT in the last 72 hours. Thyroid function studies: No results for input(s): TSH, T4TOTAL, T3FREE, THYROIDAB in the last 72 hours.  Invalid input(s): FREET3 Anemia work up: No results for input(s): VITAMINB12, FOLATE, FERRITIN, TIBC, IRON, RETICCTPCT in the last 72 hours. Sepsis Labs: Recent Labs  Lab 07/14/21 0500 07/14/21 0605 07/14/21 0803 07/15/21 0421 07/16/21 0538  WBC 13.7*  --   --  7.7 6.9  LATICACIDVEN  --  5.0* 2.7*  --   --     Microbiology Recent Results (from the past 240 hour(s))  Culture, blood (routine x 2)     Status: None (Preliminary result)   Collection Time:  07/14/21  8:02 AM   Specimen: BLOOD LEFT HAND  Result Value Ref Range Status   Specimen Description BLOOD LEFT HAND  Final   Special Requests   Final    BOTTLES DRAWN AEROBIC AND ANAEROBIC Blood Culture adequate volume   Culture   Final    NO GROWTH 2 DAYS Performed at [facility], [address], [place], [place] [address]    Report Status PENDING  Incomplete  Culture, blood (routine x 2)     Status: None (Preliminary  result)   Collection Time: 07/14/21  8:13 AM   Specimen: BLOOD LEFT FOREARM  Result Value Ref Range Status   Specimen Description BLOOD LEFT FOREARM  Final   Special Requests   Final    BOTTLES DRAWN AEROBIC AND ANAEROBIC Blood Culture results may not be optimal due to an excessive volume of blood received in culture bottles   Culture   Final    NO GROWTH 2 DAYS Performed at [facility], [address] [place], [place] [address]    Report Status PENDING  Incomplete  MRSA Next Gen by PCR, Nasal     Status: None   Collection Time: 07/14/21 10:38 AM   Specimen: Nasal Mucosa; Nasal Swab  Result Value Ref Range Status   MRSA by PCR Next Gen NOT DETECTED NOT DETECTED Final    Comment: (NOTE) The GeneXpert MRSA Assay (FDA approved for NASAL specimens only), is one component of a comprehensive MRSA colonization surveillance program. It is not intended to diagnose MRSA infection nor to guide or monitor treatment for MRSA infections. Test performance is not FDA approved in patients less than 63 years old. Performed at [facility], [address], [place], [place] [address]     Procedures and diagnostic studies:  No results found.             LOS: 2 days   Leiana Rund  [facility]   Pager on [email or web address]. If 7PM-7AM, please contact night-coverage at [email or web address]     07/16/2021, 7:10 PM

## 2021-07-17 ENCOUNTER — Inpatient Hospital Stay (HOSPITAL_COMMUNITY): Payer: HMO

## 2021-07-17 DIAGNOSIS — R9431 Abnormal electrocardiogram [ECG] [EKG]: Secondary | ICD-10-CM

## 2021-07-17 DIAGNOSIS — N1831 Chronic kidney disease, stage 3a: Secondary | ICD-10-CM

## 2021-07-17 DIAGNOSIS — Z8709 Personal history of other diseases of the respiratory system: Secondary | ICD-10-CM

## 2021-07-17 DIAGNOSIS — Z8616 Personal history of COVID-19: Secondary | ICD-10-CM

## 2021-07-17 DIAGNOSIS — R918 Other nonspecific abnormal finding of lung field: Secondary | ICD-10-CM

## 2021-07-17 DIAGNOSIS — R5383 Other fatigue: Secondary | ICD-10-CM

## 2021-07-17 DIAGNOSIS — Y92009 Unspecified place in unspecified non-institutional (private) residence as the place of occurrence of the external cause: Secondary | ICD-10-CM

## 2021-07-17 DIAGNOSIS — W19XXXA Unspecified fall, initial encounter: Secondary | ICD-10-CM

## 2021-07-17 LAB — ECHOCARDIOGRAM COMPLETE
AR max vel: 1.71 cm2
AV Area VTI: 1.89 cm2
AV Area mean vel: 1.87 cm2
AV Mean grad: 6.5 mmHg
AV Peak grad: 14.4 mmHg
Ao pk vel: 1.9 m/s
Area-P 1/2: 2.82 cm2
Calc EF: 64.9 %
Height: 70 in
MV VTI: 2.28 cm2
S' Lateral: 3.1 cm
Single Plane A2C EF: 71.4 %
Single Plane A4C EF: 56.3 %
Weight: 2908.8 oz

## 2021-07-17 LAB — RENAL FUNCTION PANEL
Albumin: 2.7 g/dL — ABNORMAL LOW (ref 3.5–5.0)
Albumin: 2.7 g/dL — ABNORMAL LOW (ref 3.5–5.0)
Anion gap: 4 — ABNORMAL LOW (ref 5–15)
Anion gap: 6 (ref 5–15)
BUN: 31 mg/dL — ABNORMAL HIGH (ref 8–23)
BUN: 32 mg/dL — ABNORMAL HIGH (ref 8–23)
CO2: 27 mmol/L (ref 22–32)
CO2: 27 mmol/L (ref 22–32)
Calcium: 8.1 mg/dL — ABNORMAL LOW (ref 8.9–10.3)
Calcium: 8.2 mg/dL — ABNORMAL LOW (ref 8.9–10.3)
Chloride: 104 mmol/L (ref 98–111)
Chloride: 104 mmol/L (ref 98–111)
Creatinine, Ser: 1.42 mg/dL — ABNORMAL HIGH (ref 0.61–1.24)
Creatinine, Ser: 1.44 mg/dL — ABNORMAL HIGH (ref 0.61–1.24)
GFR, Estimated: 54 mL/min — ABNORMAL LOW (ref >60)
GFR, Estimated: 54 mL/min — ABNORMAL LOW (ref >60)
Glucose, Bld: 139 mg/dL — ABNORMAL HIGH (ref 70–99)
Glucose, Bld: 140 mg/dL — ABNORMAL HIGH (ref 70–99)
Phosphorus: 2.7 mg/dL (ref 2.5–4.6)
Phosphorus: 2.7 mg/dL (ref 2.5–4.6)
Potassium: 4.1 mmol/L (ref 3.5–5.1)
Potassium: 4.2 mmol/L (ref 3.5–5.1)
Sodium: 135 mmol/L (ref 135–145)
Sodium: 137 mmol/L (ref 135–145)

## 2021-07-17 LAB — CORTISOL-AM, BLOOD: Cortisol - AM: 9.7 ug/dL (ref 6.7–22.6)

## 2021-07-17 MED ORDER — ACETAMINOPHEN 325 MG PO TABS: 650.0000 mg | ORAL_TABLET | Freq: Four times a day (QID) | ORAL | 0 refills | Status: AC | PRN

## 2021-07-17 MED ORDER — DILTIAZEM HCL ER COATED BEADS 120 MG PO CP24: 120.0000 mg | ORAL_CAPSULE | Freq: Every day | ORAL | 11 refills | Status: DC

## 2021-07-17 MED ORDER — PROMETHAZINE-DM 6.25-15 MG/5ML PO SYRP: 5.0000 mL | ORAL_SOLUTION | Freq: Three times a day (TID) | ORAL | 1 refills | Status: DC | PRN

## 2021-07-17 MED ORDER — IPRATROPIUM-ALBUTEROL 20-100 MCG/ACT IN AERS: 1.0000 | INHALATION_SPRAY | Freq: Four times a day (QID) | RESPIRATORY_TRACT | 5 refills | Status: DC | PRN

## 2021-07-17 MED ORDER — TRESIBA FLEXTOUCH 100 UNIT/ML ~~LOC~~ SOPN: 10.0000 [IU] | PEN_INJECTOR | Freq: Every day | SUBCUTANEOUS | 2 refills | Status: DC

## 2021-07-17 MED ORDER — METFORMIN HCL 1000 MG PO TABS: 1000.0000 mg | ORAL_TABLET | Freq: Two times a day (BID) | ORAL | 3 refills | Status: DC

## 2021-07-17 MED ORDER — ATENOLOL 25 MG PO TABS: 25.0000 mg | ORAL_TABLET | Freq: Every day | ORAL | 3 refills | Status: DC

## 2021-07-17 MED ORDER — APIXABAN 5 MG PO TABS: 5.0000 mg | ORAL_TABLET | Freq: Two times a day (BID) | ORAL | 5 refills | Status: DC

## 2021-07-17 NOTE — Discharge Summary (Signed)
[name] [name], is a 67 y.o. male  DOB [birth year]  MRN [id number].  Admission date:  07/14/2021  Admitting Physician  Jennye Boroughs, MD  Discharge Date:  07/17/2021   Primary MD  Dettinger, Fransisca Kaufmann, MD  Recommendations for primary care physician for things to follow:   1)Avoid ibuprofen/Advil/Aleve/Motrin/Goody Powders/Naproxen/BC powders/Meloxicam/Diclofenac/Indomethacin and other Nonsteroidal anti-inflammatory medications as these will make you more likely to bleed and can cause stomach ulcers, can also cause Kidney problems.   2) you are taking apixaban/Eliquis for stroke prevention due to irregular heartbeat called atrial fibrillation-----please call if any concerns about bleeding, including nosebleeds dark or bloody stools  3) follow-up to primary care physician in a couple weeks for recheck and reevaluation--- if your oxygen levels continue to be normal on room air, then the home health company may be able to take away your home oxygen equipment after being evaluated and rechecked by your primary care physician in a couple weeks  4) repeat CBC and BMP blood test with the primary care physician as outpatient in couple of weeks advised   Admission Diagnosis  Acute kidney injury (nontraumatic) ([place]) [N17.9] Atrial fibrillation with rapid ventricular response ([place]) [I48.91] AKI (acute kidney injury) ([place]) [N17.9] Elevated lactic acid level [R79.89] Nausea vomiting and diarrhea [R11.2, R19.7] Fall at home, initial encounter [W19.Merril Abbe, N05.397]   Discharge Diagnosis  Acute kidney injury (nontraumatic) ([place]) [N17.9] Atrial fibrillation with rapid ventricular response (HCC) [I48.91] AKI (acute kidney injury) ([place]) [N17.9] Elevated lactic acid level [R79.89] Nausea vomiting and diarrhea [R11.2, R19.7] Fall at home, initial encounter [W19.Merril Abbe, Q73.419]    Principal Problem:   AKI (acute kidney injury)  ([place]) Active Problems:   Vomiting and diarrhea   Atrial fibrillation with RVR (HCC)   Hyperkalemia   Metabolic acidosis      Past Medical History:  Diagnosis Date   Aortic stenosis 8/09   Bicuspid aortic valve   Arthritis 6/10   hand    Atrial fibrillation (HCC)    post op only   Bile duct calculus with acute cholecystitis    CAD (coronary artery disease) 8/09   Calculus of common bile duct with acute pancreatitis 11/2016   CTS (carpal tunnel syndrome)    bilateral    Diabetes ([place])    Diastolic dysfunction 3/79   Elevated LFTs    Fatty liver disease, nonalcoholic    History of kidney stones    Hyperlipidemia    Hypertension    Kidney stones    Nasal congestion    NIDDM (non-insulin dependent diabetes mellitus)    x15 yrs   OSA on CPAP 2006   Paresthesias in left hand 02/19/2004   Paresthesias in right hand 02/19/2004   Sinusitis    Vitamin D deficiency 06/13/09    Past Surgical History:  Procedure Laterality Date   bladder cancer - operation - 01/21/18 - Dr Tresa Carapia  01/21/2018   CARPAL TUNNEL RELEASE     bilateral    CHOLECYSTECTOMY  11/2016   COLONOSCOPY N/A 09/22/2018   Procedure:  COLONOSCOPY;  Surgeon: Rogene Houston, MD;  [facility];  Service: Endoscopy;  Laterality: N/A;  1:00   CORONARY ANGIOPLASTY WITH STENT PLACEMENT     to rt coronary atery (Dr. Degert-cardiologist)   [place], URETEROSCOPY AND STENT PLACEMENT Left 03/07/2018   Procedure: CYSTOSCOPY WITH RETROGRADE PYELOGRAM,  AND STENT PLACEMENT;  Surgeon: [name] Frock, MD;  Location: [facility];  Service: Urology;  Laterality: Left;   ERCP N/A 11/24/2016   Procedure: ENDOSCOPIC RETROGRADE CHOLANGIOPANCREATOGRAPHY (ERCP);  Surgeon: Rogene Houston, MD;  [facility];  Service: Endoscopy;  Laterality: N/A;   EXTRACORPOREAL SHOCK WAVE LITHOTRIPSY Left 03/17/2018   Procedure: LEFT EXTRACORPOREAL SHOCK WAVE LITHOTRIPSY (ESWL);  Surgeon: Irine Seal, MD;  Location:  [facility];  Service: Urology;  Laterality: Left;   REMOVAL OF STONES N/A 11/24/2016   Procedure: REMOVAL OF STONES;  Surgeon: Rogene Houston, MD;  [facility];  Service: Endoscopy;  Laterality: N/A;   SPHINCTEROTOMY N/A 11/24/2016   Procedure: SPHINCTEROTOMY;  Surgeon: Rogene Houston, MD;  [facility];  Service: Endoscopy;  Laterality: N/A;   TISSUE AORTIC VALVE REPLACEMENT     2012     HPI  from the history and physical done on the day of admission:    [name] [name] is a 66 y.o. male is a 66 year old man with medical history significant for CAD, aortic stenosis s/p bicuspid aortic valve replacement, ascending aortic aneurysm, history of atrial fibrillation before heart surgery 2 years ago (he said he has not had A. fib since heart surgery) diabetes, CKD stage IIIa, OSA not on CPAP, recent hospitalization from 07/04/2021 through 07/09/2021 for [id number]-60 pneumonia complicated by acute hypoxemic respiratory failure.  He presented to the hospital because of vomiting, diarrhea, generalized weakness, dizziness and fall.  He said since discharge from the hospital, he has been feeling weak and tired.  He started having vomiting and diarrhea about 3 days ago and that made him feel weaker.  He went to the bathroom this morning and he felt dizzy and fell hitting his forehead during the process.  No chest pain, shortness of breath, palpitations.   ED Course:  The patient was found to have A. fib with RVR and AKI and he was given IV fluid bolus, IV Zofran and IV heparin drip.     Hospital Course:    Brief Summary:- [name] [name] is a 66 y.o. male  with medical history significant for CAD, aortic stenosis s/p bicuspid aortic valve replacement, ascending aortic aneurysm, history of atrial fibrillation before heart surgery 2 years ago (he said he has not had A. fib since heart surgery) diabetes, CKD stage IIIa, OSA not on CPAP, recent hospitalization from 07/04/2021 through 07/09/2021 for  [id number] pneumonia complicated by acute hypoxemic respiratory failure.  He presented to the hospital because of vomiting, diarrhea, generalized weakness, dizziness and fall.   A/p Atrial fibrillation with RVR:  -Rate control improved on Cardizem -Okay to discharge on Cardizem and atenolol for rate control and Eliquis for stroke prophylaxis.   -Aspirin discontinued  outpatient follow-up with cardiologist was recommended. =-Echo with EF of 60 to 19%, no diastolic dysfunction, -Status post prior aortic valve replacement without any concerning findings  AKI complicated by hyperkalemia and metabolic acidosis, underlying CKD stage IIIa: Hyperkalemia and metabolic acidosis resolved. -Dizziness resolved after hydration, -Creatinine is down to 1.42 from a peak of 2.2 -Okay to discontinue lisinopril repeat BMP with PCP in a couple of weeks -  Renal function improved with hydration  Vomiting and diarrhea: Resolved, improved with IV fluids   CAD/ s/p bicuspid aortic valve replacement:  -Repeat echo is reassuring please see #1 above -Continue atenolol and atorvastatin -Outpatient follow-up with cardiology advised  Generalized weakness, s/p fall at home: PT eval appreciated recommends home health PT patient is not very keen on getting home health PT.   Recent COVID-19 infection with acute hypoxemic respiratory failure: He still has persistent infiltrates on chest x-ray from recent pneumonia.   -Overall much improved from respiratory standpoint  -Hypoxia --- resolved, patient has been weaned off oxygen  -Post ambulation oxygen saturation is 95 to 97% on room air  Discharge Condition: Stable without hypoxia,  Follow UP   Follow-up Information     Dettinger, Fransisca Kaufmann, MD. Schedule an appointment as soon as possible for a visit in 1 week(s).   Specialties: Family Medicine, Cardiology Contact information: [place] [address] [phone]         Arnoldo Lenis, MD .    Specialty: Cardiology Contact information: [place] [address] [phone]                 Diet and Activity recommendation:  As advised  Discharge Instructions  * Discharge Instructions     Call MD for:  difficulty breathing, headache or visual disturbances   Complete by: As directed    Call MD for:  persistant dizziness or light-headedness   Complete by: As directed    Call MD for:  persistant nausea and vomiting   Complete by: As directed    Call MD for:  temperature >100.4   Complete by: As directed    Diet - low sodium heart healthy   Complete by: As directed    Diet Carb Modified   Complete by: As directed    Discharge instructions   Complete by: As directed    1)Avoid ibuprofen/Advil/Aleve/Motrin/Goody Powders/Naproxen/BC powders/Meloxicam/Diclofenac/Indomethacin and other Nonsteroidal anti-inflammatory medications as these will make you more likely to bleed and can cause stomach ulcers, can also cause Kidney problems.   2) you are taking apixaban/Eliquis for stroke prevention due to irregular heartbeat called atrial fibrillation-----please call if any concerns about bleeding, including nosebleeds dark or bloody stools  3) follow-up to primary care physician in a couple weeks for recheck and reevaluation--- if your oxygen levels continue to be normal on room air, then the home health company may be able to take away your home oxygen equipment after being evaluated and rechecked by your primary care physician in a couple weeks  4) repeat CBC and BMP blood test with the primary care physician as outpatient in couple of weeks advised   Increase activity slowly   Complete by: As directed          Discharge Medications     Allergies as of 07/17/2021       Reactions   Glyxambi [empagliflozin-linagliptin] Diarrhea, Itching   He was able to take Jardiance alone with no side effects so it was the Tradj[place] part that most likely gave  him the reaction   Hctz [hydrochlorothiazide]    Nausea and headache    Sulfa Antibiotics Nausea And Vomiting   Sulfonamide Derivatives         Medication List     STOP taking these medications    aspirin EC 81 MG tablet   lisinopril 20 MG tablet Commonly known as: ZESTRIL   predniSONE 20 MG  tablet Commonly known as: DELTASONE   promethazine 25 MG tablet Commonly known as: PHENERGAN       TAKE these medications    acetaminophen 325 MG tablet Commonly known as: TYLENOL Take 2 tablets (650 mg total) by mouth every 6 (six) hours as needed for mild pain (or Fever >/= 101).   apixaban 5 MG Tabs tablet Commonly known as: ELIQUIS Take 1 tablet (5 mg total) by mouth 2 (two) times daily.   ascorbic acid 500 MG tablet Commonly known as: VITAMIN C Take 1 tablet (500 mg total) by mouth daily.   atenolol 25 MG tablet Commonly known as: TENORMIN Take 1 tablet (25 mg total) by mouth daily.   atorvastatin 40 MG tablet Commonly known as: LIPITOR Take 1 tablet (40 mg total) by mouth daily.   benzonatate 200 MG capsule Commonly known as: TESSALON Take 1 capsule (200 mg total) by mouth 3 (three) times daily as needed.   blood glucose meter kit and supplies Kit Dispense based on patient and insurance preference. Use up to four times daily as directed. (FOR ICD-9 250.00, 250.01).   cholecalciferol 1000 units tablet Commonly known as: VITAMIN D Take 2,000 Units by mouth 2 (two) times daily.   diltiazem 120 MG 24 hr capsule Commonly known as: Cardizem CD Take 1 capsule (120 mg total) by mouth daily.   fluticasone 50 MCG/ACT nasal spray Commonly known as: FLONASE Place 2 sprays into the nose daily as needed for allergies.   GAS-X PO Take 1 capsule by mouth daily as needed.   glipiZIDE 5 MG tablet Commonly known as: GLUCOTROL Take 2 tablets (10 mg total) by mouth 2 (two) times daily before a meal.   Global Ease Inject Pen Needles 31G X 5 MM Misc Generic drug:  Insulin Pen Needle Use daily as directed Dx E11.69   Ipratropium-Albuterol 20-100 MCG/ACT Aers respimat Commonly known as: COMBIVENT Inhale 1 puff into the lungs every 6 (six) hours as needed for wheezing or shortness of breath.   metFORMIN 1000 MG tablet Commonly known as: GLUCOPHAGE Take 1 tablet (1,000 mg total) by mouth 2 (two) times daily with a meal.   OneTouch Delica Plus [id number] Misc 4 (four) times daily.   OneTouch Ultra test strip Generic drug: glucose blood Test BS 4 times daily Dx E11.69   promethazine-dextromethorphan 6.25-15 MG/5ML syrup Commonly known as: PROMETHAZINE-DM Take 5 mLs by mouth 3 (three) times daily as needed for cough.   Rybelsus 14 MG Tabs Generic drug: Semaglutide Take 14 mg by mouth daily.   temazepam 15 MG capsule Commonly known as: RESTORIL TAKE ONE CAPSULE BY MOUTH AT BEDTIME AS NEEDED   Tresiba FlexTouch 100 UNIT/ML FlexTouch Pen Generic drug: insulin degludec Inject 10 Units into the skin daily. What changed: how much to take   zinc sulfate 220 (50 Zn) MG capsule Take 1 capsule (220 mg total) by mouth daily.        Major procedures and Radiology Reports - PLEASE review detailed and final reports for all details, in brief -   CT Head Wo Contrast  Result Date: 07/14/2021 CLINICAL DATA:  66 year old male status post fall. Hypotensive, shortness of breath. EXAM: CT HEAD WITHOUT CONTRAST TECHNIQUE: Contiguous axial images were obtained from the base of the skull through the vertex without intravenous contrast. COMPARISON:  Report of [facility] head CT 09/08/2010 (no images available). FINDINGS: Brain: Cerebral volume is within normal limits for age. No midline shift, ventriculomegaly, mass effect, evidence of mass lesion,  intracranial hemorrhage or evidence of cortically based acute infarction. Gray-white matter differentiation is within normal limits throughout the brain. No encephalomalacia identified. Vascular: Calcified  atherosclerosis at the skull base. Dominant left vertebral artery. No suspicious intracranial vascular hyperdensity. Skull: No acute osseous abnormality identified.  Some osteopenia. Sinuses/Orbits: Trace fluid in the mastoid air cells, but tympanic cavities remain clear. Negative visible nasopharynx. Paranasal sinuses are clear. Other: Visualized orbit soft tissues are within normal limits. No definite scalp soft tissue injury. IMPRESSION: 1. No acute traumatic injury identified. Normal for age non contrast CT appearance of the brain. 2. Trace bilateral mastoid effusions, probably post inflammatory and significance doubtful. Electronically Signed   By: Genevie Ann M.D.   On: 07/14/2021 06:03   CT Angio Chest PE W and/or Wo Contrast  Result Date: 07/04/2021 CLINICAL DATA:  Dyspnea with exertion. EXAM: CT ANGIOGRAPHY CHEST WITH CONTRAST TECHNIQUE: Multidetector CT imaging of the chest was performed using the standard protocol during bolus administration of intravenous contrast. Multiplanar CT image reconstructions and MIPs were obtained to evaluate the vascular anatomy. CONTRAST:  46mL OMNIPAQUE IOHEXOL 350 MG/ML SOLN COMPARISON:  January 15, 2020. FINDINGS: Cardiovascular: Satisfactory opacification of the pulmonary arteries to the segmental level. No evidence of pulmonary embolism. Normal heart size. No pericardial effusion. Status post aortic valve repair. Atherosclerosis of thoracic aorta is noted without aneurysm or dissection. Mediastinum/Nodes: No enlarged mediastinal, hilar, or axillary lymph nodes. Thyroid gland, trachea, and esophagus demonstrate no significant findings. Lungs/Pleura: No pneumothorax or pleural effusion is noted. Multiple patchy airspace opacities are noted in both lower lobes and left upper lobe concerning for multifocal pneumonia. Upper Abdomen: No acute abnormality. Musculoskeletal: No chest wall abnormality. No acute or significant osseous findings. Review of the MIP images confirms the  above findings. IMPRESSION: No definite evidence of pulmonary embolus. Multiple patchy airspace opacities are noted in both lungs concerning for multifocal pneumonia. Aortic Atherosclerosis (ICD10-I70.0). Electronically Signed   By: Marijo Conception M.D.   On: 07/04/2021 13:38   DG Chest Port 1 View  Result Date: 07/14/2021 CLINICAL DATA:  66 year old male with history of atrial fibrillation. Increasing shortness of breath. Hypotension. EXAM: PORTABLE CHEST 1 VIEW COMPARISON:  Chest x-ray 07/04/2021. FINDINGS: Multifocal interstitial and poorly defined airspace opacities are again noted in the left mid to lower lung. Right lung appears clear. No pneumothorax. No pleural effusions. No evidence of pulmonary edema. Heart size is normal. Upper mediastinal contours are within normal limits. Atherosclerotic calcifications are noted in the thoracic aorta. Status post median sternotomy for aortic valve replacement with what appears to be a stented bioprosthesis. IMPRESSION: 1. Persistent bronchopneumonia in the left mid to lower lung, similar to the prior study. 2. Aortic atherosclerosis. 3. Status post aortic valve replacement. Electronically Signed   By: Vinnie Langton M.D.   On: 07/14/2021 05:31   DG Chest Port 1 View  Result Date: 07/04/2021 CLINICAL DATA:  Shortness of breath on exertion since yesterday, COVID positive. EXAM: PORTABLE CHEST 1 VIEW COMPARISON:  February 24, 2018. FINDINGS: EKG leads project over the chest. Cardiomediastinal contours and hilar structures are stable following median sternotomy for aortic valve replacement. Airspace disease in the LEFT lower chest along the LEFT heart border and in the retrocardiac region. Lungs are otherwise clear. On limited assessment there is no acute skeletal process. IMPRESSION: Airspace disease likely in lingula and potentially LEFT lower lobe, concerning for developing pneumonia in the setting of COVID-19 infection. Electronically Signed   By: Jewel Baize.D.  On: 07/04/2021 12:23    Micro Results   Recent Results (from the past 240 hour(s))  Culture, blood (routine x 2)     Status: None (Preliminary result)   Collection Time: 07/14/21  8:02 AM   Specimen: BLOOD LEFT HAND  Result Value Ref Range Status   Specimen Description BLOOD LEFT HAND  Final   Special Requests   Final    BOTTLES DRAWN AEROBIC AND ANAEROBIC Blood Culture adequate volume   Culture   Final    NO GROWTH 2 DAYS Performed at [facility], [address] [place], [place] [address]    Report Status PENDING  Incomplete  Culture, blood (routine x 2)     Status: None (Preliminary result)   Collection Time: 07/14/21  8:13 AM   Specimen: BLOOD LEFT FOREARM  Result Value Ref Range Status   Specimen Description BLOOD LEFT FOREARM  Final   Special Requests   Final    BOTTLES DRAWN AEROBIC AND ANAEROBIC Blood Culture results may not be optimal due to an excessive volume of blood received in culture bottles   Culture   Final    NO GROWTH 2 DAYS Performed at [facility], [address], [place], [place] [address]    Report Status PENDING  Incomplete  MRSA Next Gen by PCR, Nasal     Status: None   Collection Time: 07/14/21 10:38 AM   Specimen: Nasal Mucosa; Nasal Swab  Result Value Ref Range Status   MRSA by PCR Next Gen NOT DETECTED NOT DETECTED Final    Comment: (NOTE) The GeneXpert MRSA Assay (FDA approved for NASAL specimens only), is one component of a comprehensive MRSA colonization surveillance program. It is not intended to diagnose MRSA infection nor to guide or monitor treatment for MRSA infections. Test performance is not FDA approved in patients less than 80 years old. Performed at [facility], [address], [place], [place] [address]        Today   Subjective    [name] [name] today has no new complaints  No fever  Or chills   No Nausea, Vomiting or Diarrhea         Patient has been seen and examined prior to discharge   Objective   Blood  pressure 112/87, pulse 84, temperature (!) 97.5 F (36.4 C), temperature source Oral, resp. rate 16, height $RemoveBe'5\' 10"'oGJZgiLWH$  (1.778 m), weight 82.5 kg, SpO2 95 %.   Intake/Output Summary (Last 24 hours) at 07/17/2021 1337 Last data filed at 07/17/2021 1100 Gross per 24 hour  Intake 749.6 ml  Output 1200 ml  Net -450.4 ml    Exam Gen:- Awake Alert, no acute distress  HEENT:- [place].AT, No sclera icterus Neck-Supple Neck,No JVD,.  Lungs-  CTAB , good air movement bilaterally  CV- S1, S2 normal, irregular Abd-  +ve B.Sounds, Abd Soft, No tenderness,    Extremity/Skin:- No  edema,   good pulses Psych-affect is appropriate, oriented x3 Neuro-no new focal deficits, no tremors    Data Review   CBC w Diff:  Lab Results  Component Value Date   WBC 6.9 07/16/2021   HGB 11.2 (L) 07/16/2021   HGB 12.6 (L) 02/03/2021   HCT 33.3 (L) 07/16/2021   HCT 37.2 (L) 02/03/2021   PLT 139 (L) 07/16/2021   PLT 183 02/03/2021   LYMPHOPCT 12 07/16/2021   MONOPCT 8 07/16/2021   EOSPCT 1 07/16/2021   BASOPCT 0 07/16/2021    CMP:  Lab Results  Component Value Date  NA 135 07/17/2021   NA 137 07/17/2021   NA 143 02/03/2021   K 4.1 07/17/2021   K 4.2 07/17/2021   CL 104 07/17/2021   CL 104 07/17/2021   CO2 27 07/17/2021   CO2 27 07/17/2021   BUN 32 (H) 07/17/2021   BUN 31 (H) 07/17/2021   BUN 29 (H) 02/03/2021   CREATININE 1.44 (H) 07/17/2021   CREATININE 1.42 (H) 07/17/2021   CREATININE 1.52 (H) 03/20/2020   PROT 7.2 07/14/2021   PROT 7.2 02/03/2021   ALBUMIN 2.7 (L) 07/17/2021   ALBUMIN 2.7 (L) 07/17/2021   ALBUMIN 4.8 02/03/2021   BILITOT 0.7 07/14/2021   BILITOT 0.5 02/03/2021   ALKPHOS 71 07/14/2021   AST 25 07/14/2021   ALT 39 07/14/2021  .   Total Discharge time is about 33 minutes  Roxan Hockey M.D on 07/17/2021 at 1:37 PM  Go to [email or web address] -  for contact info  [facility] - Office  [phone]

## 2021-07-17 NOTE — Progress Notes (Signed)
*  PRELIMINARY RESULTS* Echocardiogram 2D Echocardiogram has been performed.  [name] 07/17/2021, 9:40 AM

## 2021-07-17 NOTE — Care Management Important Message (Signed)
Important Message  Patient Details  Name: [name] [name] MRN: [id number] Date of Birth: March 11, 1956   Medicare Important Message Given:  Yes     Corey Harold 07/17/2021, 12:07 PM

## 2021-07-17 NOTE — Discharge Instructions (Signed)
1)Avoid ibuprofen/Advil/Aleve/Motrin/Goody Powders/Naproxen/BC powders/Meloxicam/Diclofenac/Indomethacin and other Nonsteroidal anti-inflammatory medications as these will make you more likely to bleed and can cause stomach ulcers, can also cause Kidney problems.   2) you are taking apixaban/Eliquis for stroke prevention due to irregular heartbeat called atrial fibrillation-----please call if any concerns about bleeding, including nosebleeds dark or bloody stools  3) follow-up to primary care physician in a couple weeks for recheck and reevaluation--- if your oxygen levels continue to be normal on room air, then the home health company may be able to take away your home oxygen equipment after being evaluated and rechecked by your primary care physician in a couple weeks  4) repeat CBC and BMP blood test with the primary care physician as outpatient in couple of weeks advised

## 2021-07-17 NOTE — Progress Notes (Signed)
SATURATION QUALIFICATIONS: (This note is used to comply with regulatory documentation for home oxygen)  Patient Saturations on Room Air at Rest = 96%  Patient Saturations on Room Air while Ambulating = 95%  Pt remained above 90% at rest and while ambulating on RA. No complaints of being SOB.

## 2021-07-17 NOTE — Progress Notes (Signed)
Patient had no c/o N/V during the night, rested well during the night. Remains on 1L O2 [place] and tele NSR.

## 2021-07-18 ENCOUNTER — Telehealth: Payer: Self-pay | Admitting: Family Medicine

## 2021-07-18 ENCOUNTER — Encounter: Payer: Self-pay | Admitting: Family Medicine

## 2021-07-18 ENCOUNTER — Ambulatory Visit (INDEPENDENT_AMBULATORY_CARE_PROVIDER_SITE_OTHER): Payer: HMO | Admitting: Family Medicine

## 2021-07-18 ENCOUNTER — Telehealth: Payer: Self-pay | Admitting: *Deleted

## 2021-07-18 VITALS — BP 143/73 | HR 73

## 2021-07-18 DIAGNOSIS — I2510 Atherosclerotic heart disease of native coronary artery without angina pectoris: Secondary | ICD-10-CM | POA: Diagnosis not present

## 2021-07-18 DIAGNOSIS — I7121 Aneurysm of the ascending aorta, without rupture: Secondary | ICD-10-CM | POA: Diagnosis not present

## 2021-07-18 DIAGNOSIS — U071 COVID-19: Secondary | ICD-10-CM | POA: Diagnosis not present

## 2021-07-18 DIAGNOSIS — G4733 Obstructive sleep apnea (adult) (pediatric): Secondary | ICD-10-CM | POA: Diagnosis not present

## 2021-07-18 DIAGNOSIS — K760 Fatty (change of) liver, not elsewhere classified: Secondary | ICD-10-CM | POA: Diagnosis not present

## 2021-07-18 DIAGNOSIS — Z955 Presence of coronary angioplasty implant and graft: Secondary | ICD-10-CM | POA: Diagnosis not present

## 2021-07-18 DIAGNOSIS — Z9981 Dependence on supplemental oxygen: Secondary | ICD-10-CM | POA: Diagnosis not present

## 2021-07-18 DIAGNOSIS — E1165 Type 2 diabetes mellitus with hyperglycemia: Secondary | ICD-10-CM | POA: Diagnosis not present

## 2021-07-18 DIAGNOSIS — J9601 Acute respiratory failure with hypoxia: Secondary | ICD-10-CM | POA: Diagnosis not present

## 2021-07-18 DIAGNOSIS — J1282 Pneumonia due to coronavirus disease 2019: Secondary | ICD-10-CM | POA: Diagnosis not present

## 2021-07-18 DIAGNOSIS — E559 Vitamin D deficiency, unspecified: Secondary | ICD-10-CM | POA: Diagnosis not present

## 2021-07-18 DIAGNOSIS — I4891 Unspecified atrial fibrillation: Secondary | ICD-10-CM | POA: Diagnosis not present

## 2021-07-18 DIAGNOSIS — R110 Nausea: Secondary | ICD-10-CM

## 2021-07-18 DIAGNOSIS — Z7952 Long term (current) use of systemic steroids: Secondary | ICD-10-CM | POA: Diagnosis not present

## 2021-07-18 DIAGNOSIS — K219 Gastro-esophageal reflux disease without esophagitis: Secondary | ICD-10-CM | POA: Diagnosis not present

## 2021-07-18 DIAGNOSIS — R42 Dizziness and giddiness: Secondary | ICD-10-CM | POA: Diagnosis not present

## 2021-07-18 DIAGNOSIS — M19049 Primary osteoarthritis, unspecified hand: Secondary | ICD-10-CM | POA: Diagnosis not present

## 2021-07-18 DIAGNOSIS — Z87891 Personal history of nicotine dependence: Secondary | ICD-10-CM | POA: Diagnosis not present

## 2021-07-18 DIAGNOSIS — E785 Hyperlipidemia, unspecified: Secondary | ICD-10-CM | POA: Diagnosis not present

## 2021-07-18 DIAGNOSIS — Z7984 Long term (current) use of oral hypoglycemic drugs: Secondary | ICD-10-CM | POA: Diagnosis not present

## 2021-07-18 DIAGNOSIS — I119 Hypertensive heart disease without heart failure: Secondary | ICD-10-CM | POA: Diagnosis not present

## 2021-07-18 MED ORDER — MECLIZINE HCL 12.5 MG PO TABS: 12.5000 mg | ORAL_TABLET | Freq: Three times a day (TID) | ORAL | 0 refills | Status: DC | PRN

## 2021-07-18 MED ORDER — ONDANSETRON 4 MG PO TBDP
4.0000 mg | ORAL_TABLET | Freq: Three times a day (TID) | ORAL | 0 refills | Status: DC | PRN
Start: 2021-07-18 — End: 2021-08-22

## 2021-07-18 NOTE — Telephone Encounter (Signed)
Have him cut his diltiazem in half, I do not think the atenolol being cut in half would make his big of a difference with his blood pressure being down as the diltiazem

## 2021-07-18 NOTE — Telephone Encounter (Signed)
(  Key: [id number]) Ondansetron 4MG  dispersible tablets  PA in process

## 2021-07-18 NOTE — Progress Notes (Signed)
Telephone visit  Subjective: OI:NOMVEH PCP: Dettinger, Fransisca Kaufmann, MD MCN:OBSJG [name] [name] is a 66 y.o. male calls for telephone consult today. Patient provides verbal consent for consult held via phone.  Due to COVID-19 pandemic this visit was conducted virtually. This visit type was conducted due to national recommendations for restrictions regarding the COVID-19 Pandemic (e.g. social distancing, sheltering in place) in an effort to limit this patient's exposure and mitigate transmission in our community. All issues noted in this document were discussed and addressed.  A physical exam was not performed with this format.   Location of patient: home Location of provider: [facility] Others present for call: none  1.  Nausea Patient reports that he was experiencing nausea in the hospital and they were pretreating him with Zofran with meals.  He has not really had any of those types of symptoms yet but he did not get discharged with any medication for nausea and he was worried about developing some over the weekend and not having access to medication.  2.  Dizziness Patient reports he has been experiencing some dizziness even during the hospitalization, and that typically presents with positional changes.  He had difficulty even getting to his chair today due to the dizziness and notes that he was seeing some spots in the mirror earlier today.  That has since resolved.  However, he does still feel woozy headed when he gets up.  He has not had any falls.  He checked his blood pressure and it was normal.  Heart rate has been normal.  Does not report any bleeding.  No unilateral weakness or sensory changes.  No difficulty swallowing or with speech.   ROS: Per HPI  Allergies  Allergen Reactions   Glyxambi [Empagliflozin-Linagliptin] Diarrhea and Itching    He was able to take Jardiance alone with no side effects so it was the Tradjenta part that most likely gave him the reaction   Hctz [Hydrochlorothiazide]      Nausea and headache    Sulfa Antibiotics Nausea And Vomiting   Sulfonamide Derivatives    Past Medical History:  Diagnosis Date   Aortic stenosis 8/09   Bicuspid aortic valve   Arthritis 6/10   hand    Atrial fibrillation ([facility])    post op only   Bile duct calculus with acute cholecystitis    CAD (coronary artery disease) 8/09   Calculus of common bile duct with acute pancreatitis 11/2016   CTS (carpal tunnel syndrome)    bilateral    Diabetes ([place])    Diastolic dysfunction 2/83   Elevated LFTs    Fatty liver disease, nonalcoholic    History of kidney stones    Hyperlipidemia    Hypertension    Kidney stones    Nasal congestion    NIDDM (non-insulin dependent diabetes mellitus)    x15 yrs   OSA on CPAP 2006   Paresthesias in left hand 02/19/2004   Paresthesias in right hand 02/19/2004   Sinusitis    Vitamin D deficiency 06/13/09    Current Outpatient Medications:    acetaminophen (TYLENOL) 325 MG tablet, Take 2 tablets (650 mg total) by mouth every 6 (six) hours as needed for mild pain (or Fever >/= 101)., Disp: 12 tablet, Rfl: 0   apixaban (ELIQUIS) 5 MG TABS tablet, Take 1 tablet (5 mg total) by mouth 2 (two) times daily., Disp: 60 tablet, Rfl: 5   ascorbic acid (VITAMIN C) 500 MG tablet, Take 1 tablet (500 mg total) by mouth  daily., Disp: 30 tablet, Rfl: 1   atenolol (TENORMIN) 25 MG tablet, Take 1 tablet (25 mg total) by mouth daily., Disp: 90 tablet, Rfl: 3   atorvastatin (LIPITOR) 40 MG tablet, Take 1 tablet (40 mg total) by mouth daily., Disp: 90 tablet, Rfl: 3   benzonatate (TESSALON) 200 MG capsule, Take 1 capsule (200 mg total) by mouth 3 (three) times daily as needed., Disp: 30 capsule, Rfl: 1   blood glucose meter kit and supplies KIT, Dispense based on patient and insurance preference. Use up to four times daily as directed. (FOR ICD-9 250.00, 250.01)., Disp: 1 each, Rfl: 0   cholecalciferol (VITAMIN D) 1000 UNITS tablet, Take 2,000 Units by mouth 2 (two) times  daily., Disp: , Rfl:    diltiazem (CARDIZEM CD) 120 MG 24 hr capsule, Take 1 capsule (120 mg total) by mouth daily., Disp: 30 capsule, Rfl: 11   fluticasone (FLONASE) 50 MCG/ACT nasal spray, Place 2 sprays into the nose daily as needed for allergies. , Disp: , Rfl:    glipiZIDE (GLUCOTROL) 5 MG tablet, Take 2 tablets (10 mg total) by mouth 2 (two) times daily before a meal., Disp: 360 tablet, Rfl: 3   insulin degludec (TRESIBA FLEXTOUCH) 100 UNIT/ML FlexTouch Pen, Inject 10 Units into the skin daily., Disp: 15 mL, Rfl: 2   Insulin Pen Needle (GLOBAL EASE INJECT PEN NEEDLES) 31G X 5 MM MISC, Use daily as directed Dx E11.69, Disp: 100 each, Rfl: 3   Ipratropium-Albuterol (COMBIVENT) 20-100 MCG/ACT AERS respimat, Inhale 1 puff into the lungs every 6 (six) hours as needed for wheezing or shortness of breath., Disp: 4 g, Rfl: 5   Lancets (ONETOUCH DELICA PLUS MBTDHR41U) MISC, 4 (four) times daily., Disp: , Rfl:    metFORMIN (GLUCOPHAGE) 1000 MG tablet, Take 1 tablet (1,000 mg total) by mouth 2 (two) times daily with a meal., Disp: 180 tablet, Rfl: 3   ONETOUCH ULTRA test strip, Test BS 4 times daily Dx E11.69, Disp: 400 each, Rfl: 3   promethazine-dextromethorphan (PROMETHAZINE-DM) 6.25-15 MG/5ML syrup, Take 5 mLs by mouth 3 (three) times daily as needed for cough., Disp: 118 mL, Rfl: 1   Semaglutide (RYBELSUS) 14 MG TABS, Take 14 mg by mouth daily., Disp: 30 tablet, Rfl: 5   Simethicone (GAS-X PO), Take 1 capsule by mouth daily as needed., Disp: , Rfl:    temazepam (RESTORIL) 15 MG capsule, TAKE ONE CAPSULE BY MOUTH AT BEDTIME AS NEEDED, Disp: 30 capsule, Rfl: 1   zinc sulfate 220 (50 Zn) MG capsule, Take 1 capsule (220 mg total) by mouth daily., Disp: 30 capsule, Rfl: 1  Assessment/ Plan: 66 y.o. male   Nausea - Plan: ondansetron (ZOFRAN-ODT) 4 MG disintegrating tablet  Dizziness - Plan: meclizine (ANTIVERT) 12.5 MG tablet, Compression stockings  Not currently having nausea but was worried about  developing it over the weekend so wanted to have something on hand.  I have sent over Zofran.  With regards to the dizziness he has been experiencing, it sounds like this is likely orthostasis as it only occurs with positional changes.  I have encouraged p.o. hydration, ordered compression hose as well as Antivert as needed.  We discussed red flag signs and symptoms warranting further evaluation in the ER.  I reviewed his CT scan of the brain which showed no acute bleeds or other abnormalities of concern a few days ago.  Start time: 8:16a End time: 8:27am  Total time spent on patient care (including telephone call/ virtual visit): 11  minutes  Janora Norlander, DO [place] [phone]

## 2021-07-18 NOTE — Telephone Encounter (Signed)
[name] [name] has been informed and will make pt aware

## 2021-07-18 NOTE — Telephone Encounter (Signed)
VM from [place] w/ [facility] Saw pt for [facility] after coming out of hospital yesterday, has new Dx of AFib, put on Eliquis & Diltiazem, he is to make a follow-up appt w/ Dr. Harl Bowie Pt had orthostatic BPs, sitting 122/78 standing  76/60 w/ c/o dizziness Pt is on Atenolol 25 mg QD, should he cut this in 1/2 until he get his appetite back, or leave this up to the Cardiologist Please advise

## 2021-07-19 ENCOUNTER — Encounter: Payer: Self-pay | Admitting: Family Medicine

## 2021-07-19 LAB — CULTURE, BLOOD (ROUTINE X 2)
Culture: NO GROWTH
Culture: NO GROWTH
Special Requests: ADEQUATE

## 2021-07-21 ENCOUNTER — Telehealth: Payer: Self-pay | Admitting: Family Medicine

## 2021-07-21 ENCOUNTER — Telehealth: Payer: Self-pay

## 2021-07-21 MED ORDER — DILTIAZEM HCL ER 90 MG PO CP12: 90.0000 mg | ORAL_CAPSULE | Freq: Every day | ORAL | 0 refills | Status: DC

## 2021-07-21 NOTE — Telephone Encounter (Signed)
Transition Care Management Follow-up Telephone Call Date of discharge and from where: 07/17/21 - [name] [name] - AKI, N/V/D, A.Fib, Fall How have you been since you were released from the hospital? He feels like he is much better now Any questions or concerns? No  Items Reviewed: Did the pt receive and understand the discharge instructions provided? Yes  Medications obtained and verified? Yes  Other? No  Any new allergies since your discharge? No  Dietary orders reviewed? Yes Do you have support at home? Yes   Home Care and Equipment/Supplies: Were home health services ordered? yes If so, what is the name of the agency? UNKNOWN - thinks it may be [facility]  Has the agency set up a time to come to the patient's home? yes Were any new equipment or medical supplies ordered?  No What is the name of the medical supply agency? N/a Were you able to get the supplies/equipment? no Do you have any questions related to the use of the equipment or supplies? No  Functional Questionnaire: (I = Independent and D = Dependent) ADLs: I  Bathing/Dressing- I  Meal Prep- I  Eating- I  Maintaining continence- I  Transferring/Ambulation- I  Managing Meds- I  Follow up appointments reviewed:  PCP Hospital f/u appt confirmed? Yes  Scheduled to see Dettinger on 07/24/21 @ 2:55. Specialist Hospital f/u appt confirmed? Yes  Scheduled to see Branch, cardiology on 4/14 @ 8:40. Are transportation arrangements needed? No  If their condition worsens, is the pt aware to call PCP or go to the Emergency Dept.? Yes Was the patient provided with contact information for the PCP's office or ED? Yes Was to pt encouraged to call back with questions or concerns? Yes

## 2021-07-21 NOTE — Telephone Encounter (Signed)
**  [facility] After Hours/ Emergency Line Call**  Patient: [name] [name].  PCP: Dettinger, [name], MD  Patient contacted the office last week regarding some orthostatic hypotension he was experiencing.  He was advised to reduce it is newly started diltiazem in half and continue all other medications as prescribed.  Unfortunately his prescription is a capsule and he has not been able to cut this in half.  I gave a verbal order to the pharmacy to reduce to diltiazem extended release 90 mg as they did not have the 60 mg in stock.  I instructed patient to continue monitoring blood pressures and utilize the compression stockings as prescribed last week.  Red flags discussed.  Will forward to PCP.  Meds ordered this encounter  Medications   diltiazem (CARDIZEM SR) 90 MG 12 hr capsule    Sig: Take 1 capsule (90 mg total) by mouth daily.    Dispense:  30 capsule    Refill:  0   Medications Discontinued During This Encounter  Medication Reason   diltiazem (CARDIZEM CD) 120 MG 24 hr capsule      Weslie Pretlow M. Nadine Counts, DO

## 2021-07-22 NOTE — Telephone Encounter (Signed)
Patient was able to pick up from pharmacy.

## 2021-07-22 NOTE — Progress Notes (Signed)
Received notification from [employer] regarding approval for TRESIBA & RYBELSUS. Patient assistance approved from 07/10/21 to 06/04/22.  Phone: [phone]

## 2021-07-24 ENCOUNTER — Ambulatory Visit: Payer: HMO | Admitting: Family Medicine

## 2021-07-24 ENCOUNTER — Ambulatory Visit (INDEPENDENT_AMBULATORY_CARE_PROVIDER_SITE_OTHER): Payer: HMO | Admitting: Family Medicine

## 2021-07-24 ENCOUNTER — Encounter: Payer: Self-pay | Admitting: Family Medicine

## 2021-07-24 VITALS — BP 149/73 | HR 64 | Ht 70.0 in | Wt 185.0 lb

## 2021-07-24 DIAGNOSIS — I4891 Unspecified atrial fibrillation: Secondary | ICD-10-CM

## 2021-07-24 DIAGNOSIS — N179 Acute kidney failure, unspecified: Secondary | ICD-10-CM | POA: Diagnosis not present

## 2021-07-24 MED ORDER — DILTIAZEM HCL ER 90 MG PO CP12: 90.0000 mg | ORAL_CAPSULE | Freq: Every day | ORAL | 3 refills | Status: DC

## 2021-07-24 NOTE — Progress Notes (Signed)
BP (!) 149/73    Pulse 64    Ht 5\' 10"  (1.778 m)    Wt 185 lb (83.9 kg)    SpO2 95%    BMI 26.54 kg/m    Subjective:   Patient ID: [name] [name], male    DOB: [birth year], 66 y.o.   MRN: [id number]  HPI: [name] [name] is a 66 y.o. male presenting on 07/24/2021 for Hospitalization Follow-up (Acute Respiratory Failure)   HPI Transition Care Management Follow-up Telephone Call Date of discharge and from where: 07/17/21 - [facility] - AKI, N/V/D, A.Fib, Fall How have you been since you were released from the hospital? He feels like he is much better now Any questions or concerns? No Amy Hopkins LPN contacted patient on 07/21/2021  Transition of care office visit Patient was admitted to the hospital on 07/14/2021 and discharged on 07/17/2021.  During the hospitalization he was found to be short of breath and have A. fib with RVR and AKI and nausea vomiting diarrhea.  He had been treated for pneumonia earlier in the month on 07/09/2021 that was COVID induced and after that he then developed vomiting weakness and had a near syncopal episode at home which took him into the ER this time.  It looks like he was started on Cardizem and is also on atenolol and Eliquis for the A. fib and stopped his aspirin.  Since leaving the ED he has not had any further episodes of A. fib or lightheaded or dizzy except for initially the medicine was high and keep his blood pressure down but since the medicine has been dropped or reducing dose he has been doing better.  He denies any headaches or chest pains.  He does have some vision changes that have started since almost episode.  Recommended for him to go see an ophthalmologist.  Relevant past medical, surgical, family and social history reviewed and updated as indicated. Interim medical history since our last visit reviewed. Allergies and medications reviewed and updated.  Review of Systems  Constitutional:  Negative for chills and fever.  HENT:  Negative for congestion.    Eyes:  Positive for visual disturbance.  Respiratory:  Negative for cough, shortness of breath and wheezing.   Cardiovascular:  Negative for chest pain, palpitations and leg swelling.  Musculoskeletal:  Negative for back pain and gait problem.  Skin:  Negative for rash.  Neurological:  Negative for dizziness, light-headedness and headaches.  All other systems reviewed and are negative.  Per HPI unless specifically indicated above   Allergies as of 07/24/2021       Reactions   Glyxambi [empagliflozin-linagliptin] Diarrhea, Itching   He was able to take Jardiance alone with no side effects so it was the Tradjenta part that most likely gave him the reaction   Hctz [hydrochlorothiazide]    Nausea and headache    Sulfa Antibiotics Nausea And Vomiting   Sulfonamide Derivatives         Medication List        Accurate as of July 24, 2021  3:42 PM. If you have any questions, ask your nurse or doctor.          STOP taking these medications    promethazine-dextromethorphan 6.25-15 MG/5ML syrup Commonly known as: PROMETHAZINE-DM Stopped by: [name] Kasey Hansell, MD       TAKE these medications    acetaminophen 325 MG tablet Commonly known as: TYLENOL Take 2 tablets (650 mg total) by mouth every 6 (six) hours  as needed for mild pain (or Fever >/= 101).   apixaban 5 MG Tabs tablet Commonly known as: ELIQUIS Take 1 tablet (5 mg total) by mouth 2 (two) times daily.   ascorbic acid 500 MG tablet Commonly known as: VITAMIN C Take 1 tablet (500 mg total) by mouth daily.   atenolol 25 MG tablet Commonly known as: TENORMIN Take 1 tablet (25 mg total) by mouth daily.   atorvastatin 40 MG tablet Commonly known as: LIPITOR Take 1 tablet (40 mg total) by mouth daily.   benzonatate 200 MG capsule Commonly known as: TESSALON Take 1 capsule (200 mg total) by mouth 3 (three) times daily as needed.   blood glucose meter kit and supplies Kit Dispense based on patient and  insurance preference. Use up to four times daily as directed. (FOR ICD-9 250.00, 250.01).   cholecalciferol 1000 units tablet Commonly known as: VITAMIN D Take 2,000 Units by mouth 2 (two) times daily.   diltiazem 90 MG 12 hr capsule Commonly known as: CARDIZEM SR Take 1 capsule (90 mg total) by mouth daily.   fluticasone 50 MCG/ACT nasal spray Commonly known as: FLONASE Place 2 sprays into the nose daily as needed for allergies.   GAS-X PO Take 1 capsule by mouth daily as needed.   glipiZIDE 5 MG tablet Commonly known as: GLUCOTROL Take 2 tablets (10 mg total) by mouth 2 (two) times daily before a meal.   Global Ease Inject Pen Needles 31G X 5 MM Misc Generic drug: Insulin Pen Needle Use daily as directed Dx E11.69   Ipratropium-Albuterol 20-100 MCG/ACT Aers respimat Commonly known as: COMBIVENT Inhale 1 puff into the lungs every 6 (six) hours as needed for wheezing or shortness of breath.   meclizine 12.5 MG tablet Commonly known as: ANTIVERT Take 1 tablet (12.5 mg total) by mouth 3 (three) times daily as needed for dizziness.   metFORMIN 1000 MG tablet Commonly known as: GLUCOPHAGE Take 1 tablet (1,000 mg total) by mouth 2 (two) times daily with a meal.   ondansetron 4 MG disintegrating tablet Commonly known as: ZOFRAN-ODT Take 1 tablet (4 mg total) by mouth every 8 (eight) hours as needed for nausea or vomiting.   OneTouch Delica Plus Lancet33G Misc 4 (four) times daily.   OneTouch Ultra test strip Generic drug: glucose blood Test BS 4 times daily Dx E11.69   Rybelsus 14 MG Tabs Generic drug: Semaglutide Take 14 mg by mouth daily.   temazepam 15 MG capsule Commonly known as: RESTORIL TAKE ONE CAPSULE BY MOUTH AT BEDTIME AS NEEDED   Tresiba FlexTouch 100 UNIT/ML FlexTouch Pen Generic drug: insulin degludec Inject 10 Units into the skin daily.   zinc sulfate 220 (50 Zn) MG capsule Take 1 capsule (220 mg total) by mouth daily.         Objective:    BP (!) 149/73    Pulse 64    Ht 5\' 10"  (1.778 m)    Wt 185 lb (83.9 kg)    SpO2 95%    BMI 26.54 kg/m   Wt Readings from Last 3 Encounters:  07/24/21 185 lb (83.9 kg)  07/17/21 181 lb 12.8 oz (82.5 kg)  07/05/21 186 lb 11.7 oz (84.7 kg)    Physical Exam Vitals and nursing note reviewed.  Constitutional:      General: He is not in acute distress.    Appearance: He is well-developed. He is not diaphoretic.  Eyes:     General: No scleral icterus.  Conjunctiva/sclera: Conjunctivae normal.  Neck:     Thyroid: No thyromegaly.  Cardiovascular:     Rate and Rhythm: Normal rate and regular rhythm.     Heart sounds: Normal heart sounds. No murmur heard. Pulmonary:     Effort: Pulmonary effort is normal. No respiratory distress.     Breath sounds: Normal breath sounds. No wheezing.  Musculoskeletal:        General: No swelling. Normal range of motion.     Cervical back: Neck supple.  Lymphadenopathy:     Cervical: No cervical adenopathy.  Skin:    General: Skin is warm and dry.     Findings: No rash.  Neurological:     Mental Status: He is alert and oriented to person, place, and time.     Coordination: Coordination normal.  Psychiatric:        Behavior: Behavior normal.      Assessment & Plan:   Problem List Items Addressed This Visit       Cardiovascular and Mediastinum   Atrial fibrillation with RVR (HCC)   Relevant Medications   diltiazem (CARDIZEM SR) 90 MG 12 hr capsule   Other Relevant Orders   CBC with Differential/Platelet   CMP14+EGFR     Genitourinary   AKI (acute kidney injury) (HCC)   Relevant Orders   CBC with Differential/Platelet   CMP14+EGFR   Other Visit Diagnoses     Atrial fibrillation with controlled ventricular rate (HCC)    -  Primary   Relevant Medications   diltiazem (CARDIZEM SR) 90 MG 12 hr capsule   Other Relevant Orders   CBC with Differential/Platelet   CMP14+EGFR       Continue Eliquis and continue Cardizem.  It seems  like his blood pressure is doing better on the lower dose is no longer lightheaded or dizzy. Follow up plan: Return in about 3 months (around 10/22/2021), or if symptoms worsen or fail to improve, for A. fib recheck.  Counseling provided for all of the vaccine components Orders Placed This Encounter  Procedures   CBC with Differential/Platelet   CMP14+EGFR    Arville Care, MD [facility] 07/24/2021, 3:42 PM

## 2021-07-25 LAB — CMP14+EGFR
ALT: 29 IU/L (ref 0–44)
AST: 22 IU/L (ref 0–40)
Albumin/Globulin Ratio: 1.4 (ref 1.2–2.2)
Albumin: 4.1 g/dL (ref 3.8–4.8)
Alkaline Phosphatase: 77 IU/L (ref 44–121)
BUN/Creatinine Ratio: 11 (ref 10–24)
BUN: 17 mg/dL (ref 8–27)
Bilirubin Total: 0.3 mg/dL (ref 0.0–1.2)
CO2: 22 mmol/L (ref 20–29)
Calcium: 9.2 mg/dL (ref 8.6–10.2)
Chloride: 107 mmol/L — ABNORMAL HIGH (ref 96–106)
Creatinine, Ser: 1.59 mg/dL — ABNORMAL HIGH (ref 0.76–1.27)
Globulin, Total: 2.9 g/dL (ref 1.5–4.5)
Glucose: 107 mg/dL — ABNORMAL HIGH (ref 70–99)
Potassium: 5.1 mmol/L (ref 3.5–5.2)
Sodium: 146 mmol/L — ABNORMAL HIGH (ref 134–144)
Total Protein: 7 g/dL (ref 6.0–8.5)
eGFR: 48 mL/min/{1.73_m2} — ABNORMAL LOW (ref >59)

## 2021-07-25 LAB — CBC WITH DIFFERENTIAL/PLATELET
Basophils Absolute: 0 10*3/uL (ref 0.0–0.2)
Basos: 1 %
EOS (ABSOLUTE): 0.1 10*3/uL (ref 0.0–0.4)
Eos: 2 %
Hematocrit: 34 % — ABNORMAL LOW (ref 37.5–51.0)
Hemoglobin: 11.5 g/dL — ABNORMAL LOW (ref 13.0–17.7)
Immature Grans (Abs): 0 10*3/uL (ref 0.0–0.1)
Immature Granulocytes: 0 %
Lymphocytes Absolute: 1 10*3/uL (ref 0.7–3.1)
Lymphs: 16 %
MCH: 32.6 pg (ref 26.6–33.0)
MCHC: 33.8 g/dL (ref 31.5–35.7)
MCV: 96 fL (ref 79–97)
Monocytes Absolute: 0.5 10*3/uL (ref 0.1–0.9)
Monocytes: 8 %
Neutrophils Absolute: 4.6 10*3/uL (ref 1.4–7.0)
Neutrophils: 73 %
Platelets: 209 10*3/uL (ref 150–450)
RBC: 3.53 x10E6/uL — ABNORMAL LOW (ref 4.14–5.80)
RDW: 13.5 % (ref 11.6–15.4)
WBC: 6.2 10*3/uL (ref 3.4–10.8)

## 2021-08-03 ENCOUNTER — Encounter: Payer: Self-pay | Admitting: Family Medicine

## 2021-08-06 ENCOUNTER — Encounter: Payer: Self-pay | Admitting: Family Medicine

## 2021-08-06 DIAGNOSIS — Z9981 Dependence on supplemental oxygen: Secondary | ICD-10-CM | POA: Diagnosis not present

## 2021-08-06 DIAGNOSIS — M19049 Primary osteoarthritis, unspecified hand: Secondary | ICD-10-CM | POA: Diagnosis not present

## 2021-08-06 DIAGNOSIS — G4733 Obstructive sleep apnea (adult) (pediatric): Secondary | ICD-10-CM | POA: Diagnosis not present

## 2021-08-06 DIAGNOSIS — J9601 Acute respiratory failure with hypoxia: Secondary | ICD-10-CM | POA: Diagnosis not present

## 2021-08-06 DIAGNOSIS — Z87891 Personal history of nicotine dependence: Secondary | ICD-10-CM | POA: Diagnosis not present

## 2021-08-06 DIAGNOSIS — Z7952 Long term (current) use of systemic steroids: Secondary | ICD-10-CM | POA: Diagnosis not present

## 2021-08-06 DIAGNOSIS — K760 Fatty (change of) liver, not elsewhere classified: Secondary | ICD-10-CM | POA: Diagnosis not present

## 2021-08-06 DIAGNOSIS — Z955 Presence of coronary angioplasty implant and graft: Secondary | ICD-10-CM | POA: Diagnosis not present

## 2021-08-06 DIAGNOSIS — E1165 Type 2 diabetes mellitus with hyperglycemia: Secondary | ICD-10-CM | POA: Diagnosis not present

## 2021-08-06 DIAGNOSIS — J1282 Pneumonia due to coronavirus disease 2019: Secondary | ICD-10-CM | POA: Diagnosis not present

## 2021-08-06 DIAGNOSIS — I2510 Atherosclerotic heart disease of native coronary artery without angina pectoris: Secondary | ICD-10-CM | POA: Diagnosis not present

## 2021-08-06 DIAGNOSIS — E785 Hyperlipidemia, unspecified: Secondary | ICD-10-CM | POA: Diagnosis not present

## 2021-08-06 DIAGNOSIS — Z7984 Long term (current) use of oral hypoglycemic drugs: Secondary | ICD-10-CM | POA: Diagnosis not present

## 2021-08-06 DIAGNOSIS — K219 Gastro-esophageal reflux disease without esophagitis: Secondary | ICD-10-CM | POA: Diagnosis not present

## 2021-08-06 DIAGNOSIS — I119 Hypertensive heart disease without heart failure: Secondary | ICD-10-CM | POA: Diagnosis not present

## 2021-08-06 DIAGNOSIS — I7121 Aneurysm of the ascending aorta, without rupture: Secondary | ICD-10-CM | POA: Diagnosis not present

## 2021-08-06 DIAGNOSIS — E559 Vitamin D deficiency, unspecified: Secondary | ICD-10-CM | POA: Diagnosis not present

## 2021-08-06 DIAGNOSIS — U071 COVID-19: Secondary | ICD-10-CM | POA: Diagnosis not present

## 2021-08-06 DIAGNOSIS — I4891 Unspecified atrial fibrillation: Secondary | ICD-10-CM | POA: Diagnosis not present

## 2021-08-07 ENCOUNTER — Ambulatory Visit: Payer: HMO | Admitting: Family Medicine

## 2021-08-07 DIAGNOSIS — M7062 Trochanteric bursitis, left hip: Secondary | ICD-10-CM | POA: Diagnosis not present

## 2021-08-07 DIAGNOSIS — M25552 Pain in left hip: Secondary | ICD-10-CM | POA: Diagnosis not present

## 2021-08-08 ENCOUNTER — Other Ambulatory Visit: Payer: Self-pay | Admitting: Family Medicine

## 2021-08-11 DIAGNOSIS — M7062 Trochanteric bursitis, left hip: Secondary | ICD-10-CM | POA: Diagnosis not present

## 2021-08-11 DIAGNOSIS — M25552 Pain in left hip: Secondary | ICD-10-CM | POA: Diagnosis not present

## 2021-08-14 DIAGNOSIS — M25552 Pain in left hip: Secondary | ICD-10-CM | POA: Diagnosis not present

## 2021-08-14 DIAGNOSIS — M7062 Trochanteric bursitis, left hip: Secondary | ICD-10-CM | POA: Diagnosis not present

## 2021-08-18 DIAGNOSIS — M25552 Pain in left hip: Secondary | ICD-10-CM | POA: Diagnosis not present

## 2021-08-18 DIAGNOSIS — M7062 Trochanteric bursitis, left hip: Secondary | ICD-10-CM | POA: Diagnosis not present

## 2021-08-20 ENCOUNTER — Telehealth: Payer: Self-pay | Admitting: Pharmacist

## 2021-08-20 NOTE — Telephone Encounter (Signed)
Please let patient know he has rybelsus 14mg  (#4boxes) up front and tresiba (#2boxes) in the fridge for pick up Via [employer] patient assistance program  Thanks!

## 2021-08-20 NOTE — Telephone Encounter (Signed)
lmtcb

## 2021-08-21 ENCOUNTER — Other Ambulatory Visit: Payer: Self-pay | Admitting: Family Medicine

## 2021-08-21 DIAGNOSIS — M7062 Trochanteric bursitis, left hip: Secondary | ICD-10-CM | POA: Diagnosis not present

## 2021-08-21 DIAGNOSIS — M25552 Pain in left hip: Secondary | ICD-10-CM | POA: Diagnosis not present

## 2021-08-21 DIAGNOSIS — R110 Nausea: Secondary | ICD-10-CM

## 2021-08-25 DIAGNOSIS — M7062 Trochanteric bursitis, left hip: Secondary | ICD-10-CM | POA: Diagnosis not present

## 2021-08-25 DIAGNOSIS — M25552 Pain in left hip: Secondary | ICD-10-CM | POA: Diagnosis not present

## 2021-09-02 DIAGNOSIS — E11319 Type 2 diabetes mellitus with unspecified diabetic retinopathy without macular edema: Secondary | ICD-10-CM | POA: Diagnosis not present

## 2021-09-02 LAB — HM DIABETES EYE EXAM

## 2021-09-09 DIAGNOSIS — M7062 Trochanteric bursitis, left hip: Secondary | ICD-10-CM | POA: Diagnosis not present

## 2021-09-09 DIAGNOSIS — M1612 Unilateral primary osteoarthritis, left hip: Secondary | ICD-10-CM | POA: Diagnosis not present

## 2021-09-15 ENCOUNTER — Other Ambulatory Visit: Payer: Self-pay | Admitting: *Deleted

## 2021-09-15 DIAGNOSIS — I6523 Occlusion and stenosis of bilateral carotid arteries: Secondary | ICD-10-CM

## 2021-09-24 ENCOUNTER — Telehealth: Payer: HMO

## 2021-09-30 ENCOUNTER — Ambulatory Visit (HOSPITAL_COMMUNITY)
Admission: RE | Admit: 2021-09-30 | Discharge: 2021-09-30 | Disposition: A | Discharge status: Home / Self Care | Payer: HMO | Source: Ambulatory Visit | Attending: Vascular Surgery | Admitting: Vascular Surgery

## 2021-09-30 ENCOUNTER — Encounter: Payer: Self-pay | Admitting: Physician Assistant

## 2021-09-30 ENCOUNTER — Ambulatory Visit: Payer: HMO | Admitting: Physician Assistant

## 2021-09-30 ENCOUNTER — Other Ambulatory Visit: Payer: Self-pay

## 2021-09-30 VITALS — BP 138/79 | HR 65 | Temp 98.0°F | Ht 70.0 in | Wt 191.6 lb

## 2021-09-30 DIAGNOSIS — I6523 Occlusion and stenosis of bilateral carotid arteries: Secondary | ICD-10-CM | POA: Insufficient documentation

## 2021-09-30 NOTE — Progress Notes (Signed)
? ? ? ? ?History of Present Illness:  Patient is a 66 y.o. year old male who presents for evaluation of asymptomatic carotid stenosis.  He is here for yearly surveillance carotid duplex.  The patient denies symptoms of TIA, amaurosis, or stroke.   ? He is medically managed on Eliquis for Afib and Statin for hyperlipidemia.  ? ? ? ?Past Medical History:  ?Diagnosis Date  ? Aortic stenosis 8/09  ? Bicuspid aortic valve  ? Arthritis 6/10  ? hand   ? Atrial fibrillation ([place])   ? post op only  ? Bile duct calculus with acute cholecystitis   ? CAD (coronary artery disease) 8/09  ? Calculus of common bile duct with acute pancreatitis 11/2016  ? CTS (carpal tunnel syndrome)   ? bilateral   ? Diabetes ([place])   ? Diastolic dysfunction 6/94  ? Elevated LFTs   ? Fatty liver disease, nonalcoholic   ? History of kidney stones   ? Hyperlipidemia   ? Hypertension   ? Kidney stones   ? Nasal congestion   ? NIDDM (non-insulin dependent diabetes mellitus)   ? x15 yrs  ? OSA on CPAP 2006  ? Paresthesias in left hand 02/19/2004  ? Paresthesias in right hand 02/19/2004  ? Sinusitis   ? Vitamin D deficiency 06/13/09  ? ? ?Past Surgical History:  ?Procedure Laterality Date  ? bladder cancer - operation - 01/21/18 - Dr Tresa Tremblay  01/21/2018  ? CARPAL TUNNEL RELEASE    ? bilateral   ? CHOLECYSTECTOMY  11/2016  ? COLONOSCOPY N/A 09/22/2018  ? Procedure: COLONOSCOPY;  Surgeon: Rogene Houston, MD;  [facility];  Service: Endoscopy;  Laterality: N/A;  1:00  ? CORONARY ANGIOPLASTY WITH STENT PLACEMENT    ? to rt coronary atery (Dr. Degert-cardiologist)  ? CYSTOSCOPY WITH RETROGRADE PYELOGRAM, URETEROSCOPY AND STENT PLACEMENT Left 03/07/2018  ? Procedure: CYSTOSCOPY WITH RETROGRADE PYELOGRAM,  AND STENT PLACEMENT;  Surgeon: [name] Frock, MD;  Location: [facility];  Service: Urology;  Laterality: Left;  ? ERCP N/A 11/24/2016  ? Procedure: ENDOSCOPIC RETROGRADE CHOLANGIOPANCREATOGRAPHY (ERCP);  Surgeon: Rogene Houston, MD;  [facility];  Service: Endoscopy;  Laterality: N/A;  ? EXTRACORPOREAL SHOCK WAVE LITHOTRIPSY Left 03/17/2018  ? Procedure: LEFT EXTRACORPOREAL SHOCK WAVE LITHOTRIPSY (ESWL);  Surgeon: Irine Seal, MD;  Location: [facility];  Service: Urology;  Laterality: Left;  ? REMOVAL OF STONES N/A 11/24/2016  ? Procedure: REMOVAL OF STONES;  Surgeon: Rogene Houston, MD;  [facility];  Service: Endoscopy;  Laterality: N/A;  ? SPHINCTEROTOMY N/A 11/24/2016  ? Procedure: SPHINCTEROTOMY;  Surgeon: Rogene Houston, MD;  [facility];  Service: Endoscopy;  Laterality: N/A;  ? TISSUE AORTIC VALVE REPLACEMENT    ? 2012  ? ? ? ?Social History ?Social History  ? ?Tobacco Use  ? Smoking status: Former  ?  Packs/day: 1.50  ?  Years: 25.00  ?  Pack years: 37.50  ?  Types: Cigarettes  ?  Start date: 07/06/1974  ?  Quit date: 07/06/2002  ?  Years since quitting: 19.2  ? Smokeless tobacco: Never  ?Vaping Use  ? Vaping Use: Never used  ?Substance Use Topics  ? Alcohol use: No  ?  Alcohol/week: 0.0 standard drinks  ? Drug use: No  ? ? ?Family History ?Family History  ?Problem Relation Age of Onset  ? Hearing loss Mother   ? Alzheimer's disease Mother   ? Hearing loss Father   ?  Diabetes Father   ? Diabetes Other   ?     Family History   ? ? ?Allergies ? ?Allergies  ?Allergen Reactions  ? Glyxambi [Empagliflozin-Linagliptin] Diarrhea and Itching  ?  He was able to take Jardiance alone with no side effects so it was the Tradjenta part that most likely gave him the reaction  ? Hctz [Hydrochlorothiazide]   ?  Nausea and headache   ? Sulfa Antibiotics Nausea And Vomiting  ? Sulfonamide Derivatives   ? ? ? ?Current Outpatient Medications  ?Medication Sig Dispense Refill  ? acetaminophen (TYLENOL) 325 MG tablet Take 2 tablets (650 mg total) by mouth every 6 (six) hours as needed for mild pain (or Fever >/= 101). 12 tablet 0  ? apixaban (ELIQUIS) 5 MG TABS tablet Take 1 tablet (5 mg total) by mouth 2 (two) times daily. 60 tablet 5  ? atenolol  (TENORMIN) 25 MG tablet Take 1 tablet (25 mg total) by mouth daily. 90 tablet 3  ? atorvastatin (LIPITOR) 40 MG tablet Take 1 tablet (40 mg total) by mouth daily. 90 tablet 3  ? blood glucose meter kit and supplies KIT Dispense based on patient and insurance preference. Use up to four times daily as directed. (FOR ICD-9 250.00, 250.01). 1 each 0  ? cholecalciferol (VITAMIN D) 1000 UNITS tablet Take 2,000 Units by mouth 2 (two) times daily.    ? diltiazem (CARDIZEM SR) 90 MG 12 hr capsule TAKE ONE CAPSULE BY MOUTH EVERY DAY 30 capsule 2  ? fluticasone (FLONASE) 50 MCG/ACT nasal spray Place 2 sprays into the nose daily as needed for allergies.     ? glipiZIDE (GLUCOTROL) 5 MG tablet Take 2 tablets (10 mg total) by mouth 2 (two) times daily before a meal. 360 tablet 3  ? insulin degludec (TRESIBA FLEXTOUCH) 100 UNIT/ML FlexTouch Pen Inject 10 Units into the skin daily. 15 mL 2  ? Insulin Pen Needle (GLOBAL EASE INJECT PEN NEEDLES) 31G X 5 MM MISC Use daily as directed Dx E11.69 100 each 3  ? Lancets (ONETOUCH DELICA PLUS SELTRV20E) MISC 4 (four) times daily.    ? metFORMIN (GLUCOPHAGE) 1000 MG tablet Take 1 tablet (1,000 mg total) by mouth 2 (two) times daily with a meal. 180 tablet 3  ? ondansetron (ZOFRAN-ODT) 4 MG disintegrating tablet DISSOLVE ONE TABLET BY MOUTH EVERY 8 HOURS AS NEEDED FOR NAUSEA AND VOMITING 20 tablet 0  ? ONETOUCH ULTRA test strip Test BS 4 times daily Dx E11.69 400 each 3  ? Semaglutide (RYBELSUS) 14 MG TABS Take 14 mg by mouth daily. 30 tablet 5  ? Simethicone (GAS-X PO) Take 1 capsule by mouth daily as needed.    ? temazepam (RESTORIL) 15 MG capsule TAKE ONE CAPSULE BY MOUTH AT BEDTIME AS NEEDED 30 capsule 1  ? ascorbic acid (VITAMIN C) 500 MG tablet Take 1 tablet (500 mg total) by mouth daily. (Patient not taking: Reported on 09/30/2021) 30 tablet 1  ? benzonatate (TESSALON) 200 MG capsule Take 1 capsule (200 mg total) by mouth 3 (three) times daily as needed. 30 capsule 1  ? meclizine  (ANTIVERT) 12.5 MG tablet Take 1 tablet (12.5 mg total) by mouth 3 (three) times daily as needed for dizziness. (Patient not taking: Reported on 09/30/2021) 30 tablet 0  ? zinc sulfate 220 (50 Zn) MG capsule Take 1 capsule (220 mg total) by mouth daily. 30 capsule 1  ? ?No current facility-administered medications for this visit.  ? ? ?ROS:  ? ?  General:  No weight loss, Fever, chills ? ?HEENT: No recent headaches, no nasal bleeding, no visual changes, no sore throat ? ?Neurologic: No dizziness, blackouts, seizures. No recent symptoms of stroke or mini- stroke. No recent episodes of slurred speech, or temporary blindness. ? ?Cardiac: No recent episodes of chest pain/pressure, no shortness of breath at rest.  No shortness of breath with exertion.  Denies history of atrial fibrillation or irregular heartbeat ? ?Vascular: No history of rest pain in feet.  No history of claudication.  No history of non-healing ulcer, No history of DVT  ? ?Pulmonary: No home oxygen, no productive cough, no hemoptysis,  No asthma or wheezing ? ?Musculoskeletal:  [ ]  Arthritis, [ ]  Low back pain,  [ ]  Joint pain ? ?Hematologic:No history of hypercoagulable state.  No history of easy bleeding.  No history of anemia ? ?Gastrointestinal: No hematochezia or melena,  No gastroesophageal reflux, no trouble swallowing ? ?Urinary: [ ]  chronic Kidney disease, [ ]  on HD - [ ]  MWF or [ ]  TTHS, [ ]  Burning with urination, [ ]  Frequent urination, [ ]  Difficulty urinating;  ? ?Skin: No rashes ? ?Psychological: No history of anxiety,  No history of depression ? ? ?Physical Examination ? ?Vitals:  ? 09/30/21 1428 09/30/21 1431  ?BP: 134/78 138/79  ?Pulse: 65 65  ?Temp: 98 ?F (36.7 ?C)   ?Weight: 191 lb 9.6 oz (86.9 kg)   ?Height: 5\' 10"  (1.778 m)   ? ? ?Body mass index is 27.49 kg/m?. ? ?General:  Alert and oriented, no acute distress ?HEENT: Normal ?Neck: No bruit or JVD ?Pulmonary: Clear to auscultation bilaterally ?Cardiac: Regular Rate and Rhythm  without murmur ?Gastrointestinal: Soft, non-tender, non-distended, no mass, no scars ?Skin: No rash ?Extremity Pulses:  2+ radial, brachial, femoral, dorsalis pedis, posterior tibial pulses bilaterally ?Musculoskelet

## 2021-10-08 DIAGNOSIS — H524 Presbyopia: Secondary | ICD-10-CM | POA: Diagnosis not present

## 2021-10-08 DIAGNOSIS — H40053 Ocular hypertension, bilateral: Secondary | ICD-10-CM | POA: Diagnosis not present

## 2021-10-17 ENCOUNTER — Ambulatory Visit: Payer: HMO | Admitting: Cardiology

## 2021-10-17 ENCOUNTER — Encounter: Payer: Self-pay | Admitting: Cardiology

## 2021-10-17 VITALS — BP 144/80 | HR 64 | Ht 70.0 in | Wt 195.4 lb

## 2021-10-17 DIAGNOSIS — I4891 Unspecified atrial fibrillation: Secondary | ICD-10-CM | POA: Diagnosis not present

## 2021-10-17 DIAGNOSIS — I10 Essential (primary) hypertension: Secondary | ICD-10-CM | POA: Diagnosis not present

## 2021-10-17 DIAGNOSIS — I7121 Aneurysm of the ascending aorta, without rupture: Secondary | ICD-10-CM | POA: Diagnosis not present

## 2021-10-17 DIAGNOSIS — Z952 Presence of prosthetic heart valve: Secondary | ICD-10-CM | POA: Diagnosis not present

## 2021-10-17 MED ORDER — DILTIAZEM HCL 30 MG PO TABS: 30.0000 mg | ORAL_TABLET | Freq: Two times a day (BID) | ORAL | 6 refills | Status: DC

## 2021-10-17 NOTE — Progress Notes (Signed)
? ? ? ?Clinical Summary ?[name] [name] is a 66 y.o.male seen today for follow up of the following medical problems  ?  ?  ?1. AVR ?- history of bicuspid AV, s/p bioprosthetic AVR in 2012 ?  ?- echo 11/2016 LVEF 60-65%, no WMAs, normal diastolic function, mild gradient across AVR.  ?Jan 2023 echo normal AVR ?- no SOB/DOE ?  ?  ?  ?2. Ascending aortic aneurysm ?- mild aortic root dilatation at 4.1 to 4.2 cm by last MRI 10/2013, likely related to hx of bicuspid AV and potential coexisting aortopathy ?- CTA 10/2014 4.1 cm ascending aortic aneurysm.  ? - CTA 11/2016 stable 4 cm aneurysm ?  ?10/2017 CTA Chest/Abd/Pelvis Sovah (in epic labeled as CT A/P): 4 cm aneurysm ?- no recent chest pain ?  ?01/2019 CTA stable 3.7 cm(reported overestimated by prior imaging) ?01/2020 CTA 3.9 cm acending aorta, overall stable ?06/2021 CT PE: no aneurysm ?  ?3. HTN ?- on higher dose of lisinopril had symptomatic low bp's ?- recent lightheadedness, pcp lowered diltiazem.  ?  ?  ?4. OSA ?- not compliant with CPAP due to discomfort. Has not been intrested in retyring ?  ?  ?5. Hyperlipidemia ?- Jan 2022 TC 110 TG 122 HDL 28 LDL 60 ?- compliant with statin ?  ?6. DM2 ?- followed by pcp ?  ?  ?  ?7. Pulmonary  Nodules ?- have been stable by imaging, monitored along with his aneurysm with his annual CT chests ?  ?8. Carotid stenosis ?- followed by vascular ?09/2021 carotid US: mod bilateral disease ?  ? 9. COVID pneumonia 06/2021 ?- severeal day hospital admission, discharged on O2. He is now off.  ? ?10. PAF ?- new diagnosis during Jan 2023 admission ?- had just been discharged for severe case of COVID pneumonia, readmitted few days later with dehydration and AKI, found to be in afib with RVR.  ?- he asymptomatic ?- no bleeding on eliqus ?- dizziness on dilt 120.  ? ? ? ?Has one grandchild, 22 yo girl ? ? ?Past Medical History:  ?Diagnosis Date  ? Aortic stenosis 8/09  ? Bicuspid aortic valve  ? Arthritis 6/10  ? hand   ? Atrial fibrillation ([place])   ?  post op only  ? Bile duct calculus with acute cholecystitis   ? CAD (coronary artery disease) 8/09  ? Calculus of common bile duct with acute pancreatitis 11/2016  ? CTS (carpal tunnel syndrome)   ? bilateral   ? Diabetes ([place])   ? Diastolic dysfunction 5/03  ? Elevated LFTs   ? Fatty liver disease, nonalcoholic   ? History of kidney stones   ? Hyperlipidemia   ? Hypertension   ? Kidney stones   ? Nasal congestion   ? NIDDM (non-insulin dependent diabetes mellitus)   ? x15 yrs  ? OSA on CPAP 2006  ? Paresthesias in left hand 02/19/2004  ? Paresthesias in right hand 02/19/2004  ? Sinusitis   ? Vitamin D deficiency 06/13/09  ? ? ? ?Allergies  ?Allergen Reactions  ? Glyxambi [Empagliflozin-Linagliptin] Diarrhea and Itching  ?  He was able to take Jardiance alone with no side effects so it was the Tradjenta part that most likely gave him the reaction  ? Hctz [Hydrochlorothiazide]   ?  Nausea and headache   ? Sulfa Antibiotics Nausea And Vomiting  ? Sulfonamide Derivatives   ? ? ? ?Current Outpatient Medications  ?Medication Sig Dispense Refill  ? acetaminophen (TYLENOL) 325 MG tablet  Take 2 tablets (650 mg total) by mouth every 6 (six) hours as needed for mild pain (or Fever >/= 101). 12 tablet 0  ? apixaban (ELIQUIS) 5 MG TABS tablet Take 1 tablet (5 mg total) by mouth 2 (two) times daily. 60 tablet 5  ? ascorbic acid (VITAMIN C) 500 MG tablet Take 1 tablet (500 mg total) by mouth daily. (Patient not taking: Reported on 09/30/2021) 30 tablet 1  ? atenolol (TENORMIN) 25 MG tablet Take 1 tablet (25 mg total) by mouth daily. 90 tablet 3  ? atorvastatin (LIPITOR) 40 MG tablet Take 1 tablet (40 mg total) by mouth daily. 90 tablet 3  ? benzonatate (TESSALON) 200 MG capsule Take 1 capsule (200 mg total) by mouth 3 (three) times daily as needed. 30 capsule 1  ? blood glucose meter kit and supplies KIT Dispense based on patient and insurance preference. Use up to four times daily as directed. (FOR ICD-9 250.00, 250.01). 1 each 0  ?  cholecalciferol (VITAMIN D) 1000 UNITS tablet Take 2,000 Units by mouth 2 (two) times daily.    ? diltiazem (CARDIZEM SR) 90 MG 12 hr capsule TAKE ONE CAPSULE BY MOUTH EVERY DAY 30 capsule 2  ? fluticasone (FLONASE) 50 MCG/ACT nasal spray Place 2 sprays into the nose daily as needed for allergies.     ? glipiZIDE (GLUCOTROL) 5 MG tablet Take 2 tablets (10 mg total) by mouth 2 (two) times daily before a meal. 360 tablet 3  ? insulin degludec (TRESIBA FLEXTOUCH) 100 UNIT/ML FlexTouch Pen Inject 10 Units into the skin daily. 15 mL 2  ? Insulin Pen Needle (GLOBAL EASE INJECT PEN NEEDLES) 31G X 5 MM MISC Use daily as directed Dx E11.69 100 each 3  ? Lancets (ONETOUCH DELICA PLUS [id number]) MISC 4 (four) times daily.    ? meclizine (ANTIVERT) 12.5 MG tablet Take 1 tablet (12.5 mg total) by mouth 3 (three) times daily as needed for dizziness. (Patient not taking: Reported on 09/30/2021) 30 tablet 0  ? metFORMIN (GLUCOPHAGE) 1000 MG tablet Take 1 tablet (1,000 mg total) by mouth 2 (two) times daily with a meal. 180 tablet 3  ? ondansetron (ZOFRAN-ODT) 4 MG disintegrating tablet DISSOLVE ONE TABLET BY MOUTH EVERY 8 HOURS AS NEEDED FOR NAUSEA AND VOMITING 20 tablet 0  ? ONETOUCH ULTRA test strip Test BS 4 times daily Dx E11.69 400 each 3  ? Semaglutide (RYBELSUS) 14 MG TABS Take 14 mg by mouth daily. 30 tablet 5  ? Simethicone (GAS-X PO) Take 1 capsule by mouth daily as needed.    ? temazepam (RESTORIL) 15 MG capsule TAKE ONE CAPSULE BY MOUTH AT BEDTIME AS NEEDED 30 capsule 1  ? zinc sulfate 220 (50 Zn) MG capsule Take 1 capsule (220 mg total) by mouth daily. 30 capsule 1  ? ?No current facility-administered medications for this visit.  ? ? ? ?Past Surgical History:  ?Procedure Laterality Date  ? bladder cancer - operation - 01/21/18 - Dr Tresa Lyn  01/21/2018  ? CARPAL TUNNEL RELEASE    ? bilateral   ? CHOLECYSTECTOMY  11/2016  ? COLONOSCOPY N/A 09/22/2018  ? Procedure: COLONOSCOPY;  Surgeon: Rogene Houston, MD;  [facility];  Service: Endoscopy;  Laterality: N/A;  1:00  ? CORONARY ANGIOPLASTY WITH STENT PLACEMENT    ? to rt coronary atery (Dr. Degert-cardiologist)  ? CYSTOSCOPY WITH RETROGRADE PYELOGRAM, URETEROSCOPY AND STENT PLACEMENT Left 03/07/2018  ? Procedure: CYSTOSCOPY WITH RETROGRADE PYELOGRAM,  AND STENT PLACEMENT;  Surgeon: [name] Frock, MD;  [facility];  Service: Urology;  Laterality: Left;  ? ERCP N/A 11/24/2016  ? Procedure: ENDOSCOPIC RETROGRADE CHOLANGIOPANCREATOGRAPHY (ERCP);  Surgeon: Rogene Houston, MD;  [facility];  Service: Endoscopy;  Laterality: N/A;  ? EXTRACORPOREAL SHOCK WAVE LITHOTRIPSY Left 03/17/2018  ? Procedure: LEFT EXTRACORPOREAL SHOCK WAVE LITHOTRIPSY (ESWL);  Surgeon: Irine Seal, MD;  [facility];  Service: Urology;  Laterality: Left;  ? REMOVAL OF STONES N/A 11/24/2016  ? Procedure: REMOVAL OF STONES;  Surgeon: Rogene Houston, MD;  [facility];  Service: Endoscopy;  Laterality: N/A;  ? SPHINCTEROTOMY N/A 11/24/2016  ? Procedure: SPHINCTEROTOMY;  Surgeon: Rogene Houston, MD;  [facility];  Service: Endoscopy;  Laterality: N/A;  ? TISSUE AORTIC VALVE REPLACEMENT    ? 2012  ? ? ? ?Allergies  ?Allergen Reactions  ? Glyxambi [Empagliflozin-Linagliptin] Diarrhea and Itching  ?  He was able to take Jardiance alone with no side effects so it was the Tradjenta part that most likely gave him the reaction  ? Hctz [Hydrochlorothiazide]   ?  Nausea and headache   ? Sulfa Antibiotics Nausea And Vomiting  ? Sulfonamide Derivatives   ? ? ? ? ?Family History  ?Problem Relation Age of Onset  ? Hearing loss Mother   ? Alzheimer's disease Mother   ? Hearing loss Father   ? Diabetes Father   ? Diabetes Other   ?     Family History   ? ? ? ?Social History ?Mr. Cadenhead reports that he quit smoking about 19 years ago. His smoking use included cigarettes. He started smoking about 47 years ago. He has a 37.50 pack-year smoking history. He has never used smokeless  tobacco. ?Mr. Taranto reports no history of alcohol use. ? ? ?Review of Systems ?CONSTITUTIONAL: No weight loss, fever, chills, weakness or fatigue.  ?HEENT: Eyes: No visual loss, blurred vision, double visio

## 2021-10-17 NOTE — Patient Instructions (Signed)
Medication Instructions:  ?Change your Diltiazem to short acting 30mg  twice a day ?Continue all other medications.    ? ?Labwork: ?none ? ?Testing/Procedures: ?none ? ?Follow-Up: ?4 months  ? ?Any Other Special Instructions Will Be Listed Below (If Applicable). ? ? ?If you need a refill on your cardiac medications before your next appointment, please call your pharmacy. ? ?

## 2021-10-21 ENCOUNTER — Telehealth: Payer: Self-pay | Admitting: Pharmacist

## 2021-10-21 NOTE — Telephone Encounter (Signed)
Left patient message: ?[employer] patient assistance supply of rybelsus arrived ?#4 boxes ?14mg  tablets ?

## 2021-10-23 ENCOUNTER — Ambulatory Visit (INDEPENDENT_AMBULATORY_CARE_PROVIDER_SITE_OTHER): Payer: HMO | Admitting: Family Medicine

## 2021-10-23 ENCOUNTER — Encounter: Payer: Self-pay | Admitting: Family Medicine

## 2021-10-23 VITALS — BP 147/72 | HR 69 | Ht 70.0 in | Wt 193.0 lb

## 2021-10-23 DIAGNOSIS — I482 Chronic atrial fibrillation, unspecified: Secondary | ICD-10-CM | POA: Diagnosis not present

## 2021-10-23 DIAGNOSIS — E785 Hyperlipidemia, unspecified: Secondary | ICD-10-CM

## 2021-10-23 DIAGNOSIS — Z23 Encounter for immunization: Secondary | ICD-10-CM

## 2021-10-23 DIAGNOSIS — E1169 Type 2 diabetes mellitus with other specified complication: Secondary | ICD-10-CM | POA: Diagnosis not present

## 2021-10-23 DIAGNOSIS — I10 Essential (primary) hypertension: Secondary | ICD-10-CM

## 2021-10-23 DIAGNOSIS — I2510 Atherosclerotic heart disease of native coronary artery without angina pectoris: Secondary | ICD-10-CM

## 2021-10-23 LAB — BAYER DCA HB A1C WAIVED: HB A1C (BAYER DCA - WAIVED): 6.6 % — ABNORMAL HIGH (ref 4.8–5.6)

## 2021-10-23 NOTE — Addendum Note (Signed)
Addended by: Dorene Sorrow on: 10/23/2021 11:24 AM ? ? Modules accepted: Orders ? ?

## 2021-10-23 NOTE — Progress Notes (Signed)
? ?BP (!) 147/72   Pulse 69   Ht $R'5\' 10"'an$  (1.778 m)   Wt 193 lb (87.5 kg)   SpO2 98%   BMI 27.69 kg/m?   ? ?Subjective:  ? ?Patient ID: [name] [name], male    DOB: Aug 23, 1955, 66 y.o.   MRN: [id number] ? ?HPI: ?[name] [name] is a 66 y.o. male presenting on 10/23/2021 for Medical Management of Chronic Issues and Diabetes ? ? ?HPI ?Hypertension ?Patient is currently on diltiazem and atenolol, cardiology just recently adjusted his medicines down., and their blood pressure today is 147/72. Patient denies any lightheadedness or dizziness. Patient denies headaches, blurred vision, chest pains, shortness of breath, or weakness. Denies any side effects from medication and is content with current medication.  ? ?Hyperlipidemia ?Patient is coming in for recheck of his hyperlipidemia. The patient is currently taking atorvastatin. They deny any issues with myalgias or history of liver damage from it. They deny any focal numbness or weakness or chest pain.  ? ?Type 2 diabetes mellitus ?Patient comes in today for recheck of his diabetes. Patient has been currently taking metformin and Rybelsus and Tresiba and glipizide. Patient is not currently on an ACE inhibitor/ARB. Patient has not seen an ophthalmologist this year. Patient denies any issues with their feet. The symptom started onset as an adult hypertension and hyperlipidemia ARE RELATED TO DM  ? ?Patient rarely uses temazepam for sleep and anxiety, he is not asking for refill today and it looks like he still has one on file.  Last refill was August 2022. ? ?Patient has CAD and A-fib and sees cardiology for this.  He is on Eliquis and denies bleeding issues ? ?Relevant past medical, surgical, family and social history reviewed and updated as indicated. Interim medical history since our last visit reviewed. ?Allergies and medications reviewed and updated. ? ?Review of Systems  ?Constitutional:  Negative for chills and fever.  ?Eyes:  Negative for visual disturbance.   ?Respiratory:  Negative for shortness of breath and wheezing.   ?Cardiovascular:  Negative for chest pain and leg swelling.  ?Musculoskeletal:  Negative for back pain and gait problem.  ?Skin:  Negative for rash.  ?Neurological:  Negative for dizziness, weakness and light-headedness.  ?Psychiatric/Behavioral:  Negative for dysphoric mood and sleep disturbance. The patient is not nervous/anxious.   ?All other systems reviewed and are negative. ? ?Per HPI unless specifically indicated above ? ? ?Allergies as of 10/23/2021   ? ?   Reactions  ? Glyxambi [empagliflozin-linagliptin] Diarrhea, Itching  ? He was able to take Jardiance alone with no side effects so it was the Tradjenta part that most likely gave him the reaction  ? Hctz [hydrochlorothiazide]   ? Nausea and headache   ? Sulfa Antibiotics Nausea And Vomiting  ? Sulfonamide Derivatives   ? ?  ? ?  ?Medication List  ?  ? ?  ? Accurate as of October 23, 2021  9:54 AM. If you have any questions, ask your nurse or doctor.  ?  ?  ? ?  ? ?STOP taking these medications   ? ?zinc sulfate 220 (50 Zn) MG capsule ?Stopped by: Worthy Rancher, MD ?  ? ?  ? ?TAKE these medications   ? ?acetaminophen 325 MG tablet ?Commonly known as: TYLENOL ?Take 2 tablets (650 mg total) by mouth every 6 (six) hours as needed for mild pain (or Fever >/= 101). ?  ?apixaban 5 MG Tabs tablet ?Commonly known as: ELIQUIS ?Take 1 tablet (  5 mg total) by mouth 2 (two) times daily. ?  ?ascorbic acid 500 MG tablet ?Commonly known as: VITAMIN C ?Take 1 tablet (500 mg total) by mouth daily. ?  ?atenolol 25 MG tablet ?Commonly known as: TENORMIN ?Take 1 tablet (25 mg total) by mouth daily. ?  ?atorvastatin 40 MG tablet ?Commonly known as: LIPITOR ?Take 1 tablet (40 mg total) by mouth daily. ?  ?benzonatate 200 MG capsule ?Commonly known as: TESSALON ?Take 1 capsule (200 mg total) by mouth 3 (three) times daily as needed. ?  ?blood glucose meter kit and supplies Kit ?Dispense based on patient and  insurance preference. Use up to four times daily as directed. (FOR ICD-9 250.00, 250.01). ?  ?cholecalciferol 1000 units tablet ?Commonly known as: VITAMIN D ?Take 2,000 Units by mouth 2 (two) times daily. ?  ?diltiazem 30 MG tablet ?Commonly known as: Cardizem ?Take 1 tablet (30 mg total) by mouth 2 (two) times daily. ?  ?fluticasone 50 MCG/ACT nasal spray ?Commonly known as: FLONASE ?Place 2 sprays into the nose daily as needed for allergies. ?  ?GAS-X PO ?Take 1 capsule by mouth daily as needed. ?  ?glipiZIDE 5 MG tablet ?Commonly known as: GLUCOTROL ?Take 2 tablets (10 mg total) by mouth 2 (two) times daily before a meal. ?  ?Global Ease Inject Pen Needles 31G X 5 MM Misc ?Generic drug: Insulin Pen Needle ?Use daily as directed Dx E11.69 ?  ?meclizine 12.5 MG tablet ?Commonly known as: ANTIVERT ?Take 1 tablet (12.5 mg total) by mouth 3 (three) times daily as needed for dizziness. ?  ?metFORMIN 1000 MG tablet ?Commonly known as: GLUCOPHAGE ?Take 1 tablet (1,000 mg total) by mouth 2 (two) times daily with a meal. ?  ?ondansetron 4 MG disintegrating tablet ?Commonly known as: ZOFRAN-ODT ?DISSOLVE ONE TABLET BY MOUTH EVERY 8 HOURS AS NEEDED FOR NAUSEA AND VOMITING ?  ?OneTouch Delica Plus [id number] Misc ?4 (four) times daily. ?  ?OneTouch Ultra test strip ?Generic drug: glucose blood ?Test BS 4 times daily Dx E11.69 ?  ?Rybelsus 14 MG Tabs ?Generic drug: Semaglutide ?Take 14 mg by mouth daily. ?  ?temazepam 15 MG capsule ?Commonly known as: RESTORIL ?TAKE ONE CAPSULE BY MOUTH AT BEDTIME AS NEEDED ?  ?Tyler Aas FlexTouch 100 UNIT/ML FlexTouch Pen ?Generic drug: insulin degludec ?Inject 10 Units into the skin daily. ?  ? ?  ? ? ? ?Objective:  ? ?BP (!) 147/72   Pulse 69   Ht $R'5\' 10"'hx$  (1.778 m)   Wt 193 lb (87.5 kg)   SpO2 98%   BMI 27.69 kg/m?   ?Wt Readings from Last 3 Encounters:  ?10/23/21 193 lb (87.5 kg)  ?10/17/21 195 lb 6.4 oz (88.6 kg)  ?09/30/21 191 lb 9.6 oz (86.9 kg)  ?  ?Physical Exam ?Vitals and nursing  note reviewed.  ?Constitutional:   ?   General: He is not in acute distress. ?   Appearance: He is well-developed. He is not diaphoretic.  ?Eyes:  ?   General: No scleral icterus. ?   Conjunctiva/sclera: Conjunctivae normal.  ?Neck:  ?   Thyroid: No thyromegaly.  ?Cardiovascular:  ?   Rate and Rhythm: Normal rate and regular rhythm.  ?   Heart sounds: Normal heart sounds. No murmur heard. ?Pulmonary:  ?   Effort: Pulmonary effort is normal. No respiratory distress.  ?   Breath sounds: Normal breath sounds. No wheezing.  ?Musculoskeletal:     ?   General: No swelling. Normal range of motion.  ?  Cervical back: Neck supple.  ?Lymphadenopathy:  ?   Cervical: No cervical adenopathy.  ?Skin: ?   General: Skin is warm and dry.  ?   Findings: No rash.  ?Neurological:  ?   Mental Status: He is alert and oriented to person, place, and time.  ?   Coordination: Coordination normal.  ?Psychiatric:     ?   Behavior: Behavior normal.  ? ? ?Results for orders placed or performed in visit on 09/23/21  ?HM DIABETES EYE EXAM  ?Result Value Ref Range  ? HM Diabetic Eye Exam No Retinopathy No Retinopathy  ? ? ?Assessment & Plan:  ? ?Problem List Items Addressed This Visit   ? ?  ? Cardiovascular and Mediastinum  ? Essential hypertension  ? Atrial fibrillation, chronic ([place])  ? CAD (coronary artery disease)  ?  ? Endocrine  ? Type 2 diabetes mellitus with hyperlipidemia ([place]) - Primary  ? Relevant Orders  ? Bayer DCA Hb A1c Waived  ? Microalbumin / creatinine urine ratio  ?  ? Other  ? Hyperlipidemia with target LDL less than 70  ?  ?Continue current medicine, will keep a close eye on his blood pressure and follow-up with [place] in cardiology if continues to be slightly elevated.  For now allowing permissive hypertension because medicine was recently adjusted less than a week ago ? ?A1c looks good at 6.6, no change ?Follow up plan: ?Return in about 3 months (around 01/22/2022), or if symptoms worsen or fail to improve, for Hypertension and  diabetes and CAD. ? ?Counseling provided for all of the vaccine components ?Orders Placed This Encounter  ?Procedures  ? Bayer DCA Hb A1c Waived  ? Microalbumin / creatinine urine ratio  ? ? ?Caryl Pina, MD ?[facility]

## 2021-10-25 ENCOUNTER — Other Ambulatory Visit: Payer: Self-pay | Admitting: Family Medicine

## 2021-11-13 ENCOUNTER — Telehealth: Payer: Self-pay

## 2021-11-13 NOTE — Telephone Encounter (Signed)
Spoke with wife and advised we had received two boxes of Tresiba through patient assistance and it was ready for pick up. Wife verbalized understanding and stated they would be by to pick it up ?

## 2021-11-19 ENCOUNTER — Encounter: Payer: Self-pay | Admitting: Pharmacist

## 2021-11-19 ENCOUNTER — Ambulatory Visit (INDEPENDENT_AMBULATORY_CARE_PROVIDER_SITE_OTHER): Payer: HMO | Admitting: Pharmacist

## 2021-11-19 DIAGNOSIS — E1169 Type 2 diabetes mellitus with other specified complication: Secondary | ICD-10-CM

## 2021-11-19 DIAGNOSIS — I10 Essential (primary) hypertension: Secondary | ICD-10-CM

## 2021-12-02 ENCOUNTER — Ambulatory Visit (INDEPENDENT_AMBULATORY_CARE_PROVIDER_SITE_OTHER): Payer: HMO

## 2021-12-02 VITALS — Wt 193.0 lb

## 2021-12-02 DIAGNOSIS — Z0000 Encounter for general adult medical examination without abnormal findings: Secondary | ICD-10-CM | POA: Diagnosis not present

## 2021-12-02 NOTE — Patient Instructions (Signed)
[name] [name] , Thank you for taking time to come for your Medicare Wellness Visit. I appreciate your ongoing commitment to your health goals. Please review the following plan we discussed and let me know if I can assist you in the future.   Screening recommendations/referrals: Colonoscopy: Done 09/22/2018 - Repeat in 10 years Recommended yearly ophthalmology/optometry visit for glaucoma screening and checkup Recommended yearly dental visit for hygiene and checkup  Vaccinations: Influenza vaccine: Done 05/07/2021 - Repeat annually Pneumococcal vaccine: Done 11/24/2013 & 04/20/2019     Tdap vaccine: Done 01/25/2020 - Repeat in 10 years  Shingles vaccine: Done 10/23/2021 - get second dose at next visit   Covid-19: Done  09/13/2019, 10/09/2019, & 06/24/2020  Advanced directives: Advance directive discussed with you today. I have provided a copy for you to complete at home and have notarized. Once this is complete please bring a copy in to our office so we can scan it into your chart.   Conditions/risks identified: Aim for 30 minutes of exercise or brisk walking, 6-8 glasses of water, and 5 servings of fruits and vegetables each day.   Next appointment: Follow up in one year for your annual wellness visit.   Preventive Care 24 Years and Older, Male  Preventive care refers to lifestyle choices and visits with your health care provider that can promote health and wellness. What does preventive care include? A yearly physical exam. This is also called an annual well check. Dental exams once or twice a year. Routine eye exams. Ask your health care provider how often you should have your eyes checked. Personal lifestyle choices, including: Daily care of your teeth and gums. Regular physical activity. Eating a healthy diet. Avoiding tobacco and drug use. Limiting alcohol use. Practicing safe sex. Taking low doses of aspirin every day. Taking vitamin and mineral supplements as recommended by your health  care provider. What happens during an annual well check? The services and screenings done by your health care provider during your annual well check will depend on your age, overall health, lifestyle risk factors, and family history of disease. Counseling  Your health care provider may ask you questions about your: Alcohol use. Tobacco use. Drug use. Emotional well-being. Home and relationship well-being. Sexual activity. Eating habits. History of falls. Memory and ability to understand (cognition). Work and work Astronomer. Screening  You may have the following tests or measurements: Height, weight, and BMI. Blood pressure. Lipid and cholesterol levels. These may be checked every 5 years, or more frequently if you are over 67 years old. Skin check. Lung cancer screening. You may have this screening every year starting at age 67 if you have a 30-pack-year history of smoking and currently smoke or have quit within the past 15 years. Fecal occult blood test (FOBT) of the stool. You may have this test every year starting at age 67. Flexible sigmoidoscopy or colonoscopy. You may have a sigmoidoscopy every 5 years or a colonoscopy every 10 years starting at age 67. Prostate cancer screening. Recommendations will vary depending on your family history and other risks. Hepatitis C blood test. Hepatitis B blood test. Sexually transmitted disease (STD) testing. Diabetes screening. This is done by checking your blood sugar (glucose) after you have not eaten for a while (fasting). You may have this done every 1-3 years. Abdominal aortic aneurysm (AAA) screening. You may need this if you are a current or former smoker. Osteoporosis. You may be screened starting at age 67 if you are at high risk.  Talk with your health care provider about your test results, treatment options, and if necessary, the need for more tests. Vaccines  Your health care provider may recommend certain vaccines, such  as: Influenza vaccine. This is recommended every year. Tetanus, diphtheria, and acellular pertussis (Tdap, Td) vaccine. You may need a Td booster every 10 years. Zoster vaccine. You may need this after age 67. Pneumococcal 13-valent conjugate (PCV13) vaccine. One dose is recommended after age 67. Pneumococcal polysaccharide (PPSV23) vaccine. One dose is recommended after age 67. Talk to your health care provider about which screenings and vaccines you need and how often you need them. This information is not intended to replace advice given to you by your health care provider. Make sure you discuss any questions you have with your health care provider. Document Released: 07/19/2015 Document Revised: 03/11/2016 Document Reviewed: 04/23/2015 Elsevier Interactive Patient Education  2017 [place] Prevention in the Home Falls can cause injuries. They can happen to people of all ages. There are many things you can do to make your home safe and to help prevent falls. What can I do on the outside of my home? Regularly fix the edges of walkways and driveways and fix any cracks. Remove anything that might make you trip as you walk through a door, such as a raised step or threshold. Trim any bushes or trees on the path to your home. Use bright outdoor lighting. Clear any walking paths of anything that might make someone trip, such as rocks or tools. Regularly check to see if handrails are loose or broken. Make sure that both sides of any steps have handrails. Any raised decks and porches should have guardrails on the edges. Have any leaves, snow, or ice cleared regularly. Use sand or salt on walking paths during winter. Clean up any spills in your garage right away. This includes oil or grease spills. What can I do in the bathroom? Use night lights. Install grab bars by the toilet and in the tub and shower. Do not use towel bars as grab bars. Use non-skid mats or decals in the tub or  shower. If you need to sit down in the shower, use a plastic, non-slip stool. Keep the floor dry. Clean up any water that spills on the floor as soon as it happens. Remove soap buildup in the tub or shower regularly. Attach bath mats securely with double-sided non-slip rug tape. Do not have throw rugs and other things on the floor that can make you trip. What can I do in the bedroom? Use night lights. Make sure that you have a light by your bed that is easy to reach. Do not use any sheets or blankets that are too big for your bed. They should not hang down onto the floor. Have a firm chair that has side arms. You can use this for support while you get dressed. Do not have throw rugs and other things on the floor that can make you trip. What can I do in the kitchen? Clean up any spills right away. Avoid walking on wet floors. Keep items that you use a lot in easy-to-reach places. If you need to reach something above you, use a strong step stool that has a grab bar. Keep electrical cords out of the way. Do not use floor polish or wax that makes floors slippery. If you must use wax, use non-skid floor wax. Do not have throw rugs and other things on the floor that can make  you trip. What can I do with my stairs? Do not leave any items on the stairs. Make sure that there are handrails on both sides of the stairs and use them. Fix handrails that are broken or loose. Make sure that handrails are as long as the stairways. Check any carpeting to make sure that it is firmly attached to the stairs. Fix any carpet that is loose or worn. Avoid having throw rugs at the top or bottom of the stairs. If you do have throw rugs, attach them to the floor with carpet tape. Make sure that you have a light switch at the top of the stairs and the bottom of the stairs. If you do not have them, ask someone to add them for you. What else can I do to help prevent falls? Wear shoes that: Do not have high heels. Have  rubber bottoms. Are comfortable and fit you well. Are closed at the toe. Do not wear sandals. If you use a stepladder: Make sure that it is fully opened. Do not climb a closed stepladder. Make sure that both sides of the stepladder are locked into place. Ask someone to hold it for you, if possible. Clearly mark and make sure that you can see: Any grab bars or handrails. First and last steps. Where the edge of each step is. Use tools that help you move around (mobility aids) if they are needed. These include: Canes. Walkers. Scooters. Crutches. Turn on the lights when you go into a dark area. Replace any light bulbs as soon as they burn out. Set up your furniture so you have a clear path. Avoid moving your furniture around. If any of your floors are uneven, fix them. If there are any pets around you, be aware of where they are. Review your medicines with your doctor. Some medicines can make you feel dizzy. This can increase your chance of falling. Ask your doctor what other things that you can do to help prevent falls. This information is not intended to replace advice given to you by your health care provider. Make sure you discuss any questions you have with your health care provider. Document Released: 04/18/2009 Document Revised: 11/28/2015 Document Reviewed: 07/27/2014 Elsevier Interactive Patient Education  2017 [employer].

## 2021-12-02 NOTE — Progress Notes (Signed)
Subjective:   [name] [name] is a 66 y.o. male who presents for an Initial Medicare Annual Wellness Visit.  Virtual Visit via Telephone Note  I connected with  [name] [name] on 12/02/21 at  1:15 PM EDT by telephone and verified that I am speaking with the correct person using two identifiers.  Location: Patient: Home Provider: [facility] Persons participating in the virtual visit: patient/Nurse Health Advisor   I discussed the limitations, risks, security and privacy concerns of performing an evaluation and management service by telephone and the availability of in person appointments. The patient expressed understanding and agreed to proceed.  Interactive audio and video telecommunications were attempted between this nurse and patient, however failed, due to patient having technical difficulties OR patient did not have access to video capability.  We continued and completed visit with audio only.  Some vital signs may be absent or patient reported.   Derel Mcglasson E Felton Buczynski, LPN   Review of Systems     Cardiac Risk Factors include: advanced age (>23mn, >>34women);diabetes mellitus;dyslipidemia;hypertension;male gender;Other (see comment), Risk factor comments: fatty liver, CAD, A.Fib, OSA, aortic aneurysm     Objective:    Today's Vitals   12/02/21 1317  Weight: 193 lb (87.5 kg)   Body mass index is 27.69 kg/m.     12/02/2021    1:27 PM 07/14/2021    9:00 AM 07/14/2021    4:55 AM 07/04/2021   11:00 PM 07/04/2021   10:48 AM 11/29/2020    2:31 PM 09/22/2018   12:26 PM  Advanced Directives  Does Patient Have a Medical Advance Directive? _0  No No  Would patient like information on creating a medical advance directive? Yes (MAU/Ambulatory/Procedural Areas - Information given) No - Patient declined No - Patient declined No - Patient declined No - Patient declined No - Patient declined No - Patient declined    Current Medications (verified) Outpatient Encounter Medications as of  12/02/2021  Medication Sig   acetaminophen (TYLENOL) 325 MG tablet Take 2 tablets (650 mg total) by mouth every 6 (six) hours as needed for mild pain (or Fever >/= 101).   apixaban (ELIQUIS) 5 MG TABS tablet Take 1 tablet (5 mg total) by mouth 2 (two) times daily.   atenolol (TENORMIN) 25 MG tablet Take 1 tablet (25 mg total) by mouth daily.   atorvastatin (LIPITOR) 40 MG tablet Take 1 tablet (40 mg total) by mouth daily.   blood glucose meter kit and supplies KIT Dispense based on patient and insurance preference. Use up to four times daily as directed. (FOR ICD-9 250.00, 250.01).   cholecalciferol (VITAMIN D) 1000 UNITS tablet Take 2,000 Units by mouth 2 (two) times daily.   diltiazem (CARDIZEM) 30 MG tablet Take 1 tablet (30 mg total) by mouth 2 (two) times daily.   glipiZIDE (GLUCOTROL) 5 MG tablet Take 2 tablets (10 mg total) by mouth 2 (two) times daily before a meal.   insulin degludec (TRESIBA FLEXTOUCH) 100 UNIT/ML FlexTouch Pen Inject 10 Units into the skin daily.   Insulin Pen Needle (GLOBAL EASE INJECT PEN NEEDLES) 31G X 5 MM MISC Use daily as directed Dx E11.69   Lancets (ONETOUCH DELICA PLUS LDCVUDT14H MISC 4 (four) times daily.   metFORMIN (GLUCOPHAGE) 1000 MG tablet Take 1 tablet (1,000 mg total) by mouth 2 (two) times daily with a meal.   ONETOUCH ULTRA test strip Test BS 4 times daily Dx E11.69   Semaglutide (RYBELSUS) 14 MG TABS Take 14  mg by mouth daily.   fluticasone (FLONASE) 50 MCG/ACT nasal spray Place 2 sprays into the nose daily as needed for allergies.  (Patient not taking: Reported on 12/02/2021)   ondansetron (ZOFRAN-ODT) 4 MG disintegrating tablet DISSOLVE ONE TABLET BY MOUTH EVERY 8 HOURS AS NEEDED FOR NAUSEA AND VOMITING (Patient not taking: Reported on 12/02/2021)   Simethicone (GAS-X PO) Take 1 capsule by mouth daily as needed. (Patient not taking: Reported on 12/02/2021)   temazepam (RESTORIL) 15 MG capsule TAKE ONE CAPSULE BY MOUTH AT BEDTIME AS NEEDED (Patient not  taking: Reported on 12/02/2021)   [DISCONTINUED] ascorbic acid (VITAMIN C) 500 MG tablet Take 1 tablet (500 mg total) by mouth daily. (Patient not taking: Reported on 12/02/2021)   [DISCONTINUED] benzonatate (TESSALON) 200 MG capsule Take 1 capsule (200 mg total) by mouth 3 (three) times daily as needed. (Patient not taking: Reported on 12/02/2021)   [DISCONTINUED] meclizine (ANTIVERT) 12.5 MG tablet Take 1 tablet (12.5 mg total) by mouth 3 (three) times daily as needed for dizziness. (Patient not taking: Reported on 12/02/2021)   No facility-administered encounter medications on file as of 12/02/2021.    Allergies (verified) Glyxambi [empagliflozin-linagliptin], Hctz [hydrochlorothiazide], Sulfa antibiotics, and Sulfonamide derivatives   History: Past Medical History:  Diagnosis Date   Aortic stenosis 8/09   Bicuspid aortic valve   Arthritis 6/10   hand    Atrial fibrillation (HCC)    post op only   Bile duct calculus with acute cholecystitis    CAD (coronary artery disease) 8/09   Calculus of common bile duct with acute pancreatitis 11/2016   CTS (carpal tunnel syndrome)    bilateral    Diabetes ([place])    Diastolic dysfunction 0/03   Elevated LFTs    Fatty liver disease, nonalcoholic    History of kidney stones    Hyperlipidemia    Hypertension    Kidney stones    Nasal congestion    NIDDM (non-insulin dependent diabetes mellitus)    x15 yrs   OSA on CPAP 2006   Paresthesias in left hand 02/19/2004   Paresthesias in right hand 02/19/2004   Sinusitis    Vitamin D deficiency 06/13/09   Past Surgical History:  Procedure Laterality Date   bladder cancer - operation - 01/21/18 - Dr Tresa Cullens  01/21/2018   CARPAL TUNNEL RELEASE     bilateral    CHOLECYSTECTOMY  11/2016   COLONOSCOPY N/A 09/22/2018   Procedure: COLONOSCOPY;  Surgeon: Rogene Houston, MD;  [facility];  Service: Endoscopy;  Laterality: N/A;  1:00   CORONARY ANGIOPLASTY WITH STENT PLACEMENT     to rt  coronary atery (Dr. Degert-cardiologist)   [place], URETEROSCOPY AND STENT PLACEMENT Left 03/07/2018   Procedure: CYSTOSCOPY WITH RETROGRADE PYELOGRAM,  AND STENT PLACEMENT;  Surgeon: [name] Frock, MD;  Location: [facility];  Service: Urology;  Laterality: Left;   ERCP N/A 11/24/2016   Procedure: ENDOSCOPIC RETROGRADE CHOLANGIOPANCREATOGRAPHY (ERCP);  Surgeon: Rogene Houston, MD;  [facility];  Service: Endoscopy;  Laterality: N/A;   EXTRACORPOREAL SHOCK WAVE LITHOTRIPSY Left 03/17/2018   Procedure: LEFT EXTRACORPOREAL SHOCK WAVE LITHOTRIPSY (ESWL);  Surgeon: Irine Seal, MD;  Location: [facility];  Service: Urology;  Laterality: Left;   REMOVAL OF STONES N/A 11/24/2016   Procedure: REMOVAL OF STONES;  Surgeon: Rogene Houston, MD;  [facility];  Service: Endoscopy;  Laterality: N/A;   SPHINCTEROTOMY N/A 11/24/2016   Procedure: SPHINCTEROTOMY;  Surgeon: Rogene Houston,  MD;  [facility];  Service: Endoscopy;  Laterality: N/A;   TISSUE AORTIC VALVE REPLACEMENT     2012   Family History  Problem Relation Age of Onset   Hearing loss Mother    Alzheimer's disease Mother    Hearing loss Father    Diabetes Father    Diabetes Other        Family History    Social History   Socioeconomic History   Marital status: Married    Spouse name: Coralyn Mark   Number of children: 3   Years of education: Not on file   Highest education level: High school graduate  Occupational History   Occupation: Retired  Tobacco Use   Smoking status: Former    Packs/day: 1.50    Years: 25.00    Pack years: 37.50    Types: Cigarettes    Start date: 07/06/1974    Quit date: 07/06/2002    Years since quitting: 19.4   Smokeless tobacco: Never  Vaping Use   Vaping Use: Never used  Substance and Sexual Activity   Alcohol use: No    Alcohol/week: 0.0 standard drinks   Drug use: No   Sexual activity: Not on file  Other Topics Concern   Not on file  Social  History Narrative   Not on file   Social Determinants of Health   Financial Resource Strain: Low Risk    Difficulty of Paying Living Expenses: Not hard at all  Food Insecurity: No Food Insecurity   Worried About Charity fundraiser in the Last Year: Never true   [place] in the Last Year: Never true  Transportation Needs: No Transportation Needs   Lack of Transportation (Medical): No   Lack of Transportation (Non-Medical): No  Physical Activity: Sufficiently Active   Days of Exercise per Week: 7 days   Minutes of Exercise per Session: 50 min  Stress: No Stress Concern Present   Feeling of Stress : Not at all  Social Connections: Socially Integrated   Frequency of Communication with Friends and Family: More than three times a week   Frequency of Social Gatherings with Friends and Family: More than three times a week   Attends Religious Services: More than 4 times per year   Active Member of [employer] or Organizations: Yes   Attends Music therapist: More than 4 times per year   Marital Status: Married    Tobacco Counseling Counseling given: Not Answered   Clinical Intake:  Pre-visit preparation completed: Yes  Pain : No/denies pain     BMI - recorded: 27.69 Nutritional Status: BMI 25 -29 Overweight Nutritional Risks: None Diabetes: Yes CBG done?: No Did pt. bring in CBG monitor from home?: No  How often do you need to have someone help you when you read instructions, pamphlets, or other written materials from your doctor or pharmacy?: 1 - Never  Diabetic? Nutrition Risk Assessment:  Has the patient had any N/V/D within the last 2 months?  No  Does the patient have any non-healing wounds?  No  Has the patient had any unintentional weight loss or weight gain?  No   Diabetes:  Is the patient diabetic?  Yes  If diabetic, was a CBG obtained today?  No  Did the patient bring in their glucometer from home?  No  How often do you monitor your CBG's?  Twice per day - 135 this am per patient.   Financial Strains and Diabetes Management:  Are you having any financial strains with the device, your supplies or your medication? No .  Does the patient want to be seen by Chronic Care Management for management of their diabetes?  No  Would the patient like to be referred to a Nutritionist or for Diabetic Management?  No   Diabetic Exams:  Diabetic Eye Exam: Completed 09/02/2021.   Diabetic Foot Exam: Completed 02/03/2021. Pt has been advised about the importance in completing this exam.   Interpreter Needed?: No  Information entered by :: Latravia Southgate, LPN   Activities of Daily Living    12/02/2021    1:27 PM 07/14/2021    9:00 AM  In your present state of health, do you have any difficulty performing the following activities:  Hearing? 1 1  Vision? 0 0  Difficulty concentrating or making decisions? 1 0  Walking or climbing stairs? 1 0  Dressing or bathing? 0 0  Doing errands, shopping? 0 0  Preparing Food and eating ? N   Using the Toilet? N   In the past six months, have you accidently leaked urine? N   Do you have problems with loss of bowel control? N   Managing your Medications? N   Managing your Finances? N   Housekeeping or managing your Housekeeping? N     Patient Care Team: Dettinger, Fransisca Kaufmann, MD as PCP - General (Family Medicine) Harl Bowie Alphonse Guild, MD as PCP - Cardiology (Cardiology) Lavera Guise, [facility] (Pharmacist)  Indicate any recent Medical Services you may have received from other than [facility] providers in the past year (date may be approximate).     Assessment:   This is a routine wellness examination for [name] [name].  Hearing/Vision screen Hearing Screening - Comments:: Wears hearing aids - routine visits with [facility] Vision Screening - Comments:: Wears rx glasses - up to date with routine eye exams with Rosana Hoes at [facility] in [place] issues and exercise activities discussed: Current Exercise  Habits: Home exercise routine, Type of exercise: Other - see comments (stationary bike 30-60 minutes per day), Time (Minutes): 50, Frequency (Times/Week): 7, Weekly Exercise (Minutes/Week): 350, Intensity: Mild, Exercise limited by: orthopedic condition(s);cardiac condition(s)   Goals Addressed             This Visit's Progress    DIET - EAT MORE FRUITS AND VEGETABLES   On track    Exercise 150 min/wk Moderate Activity   On track      Depression Screen    12/02/2021    1:25 PM 10/23/2021    9:24 AM 07/24/2021    3:18 PM 06/23/2021    4:11 PM 05/07/2021    8:18 AM 02/03/2021   11:46 AM 02/03/2021   11:18 AM  PHQ 2/9 Scores  PHQ - 2 Score 0 0 3 0 0 0 0  PHQ- 9 Score _0 Fall Risk    12/02/2021    1:19 PM 10/23/2021    9:24 AM 07/24/2021    3:16 PM 06/23/2021    4:11 PM 05/07/2021    8:18 AM  Fall Risk   Falls in the past year? _1 0 0  Number falls in past yr: 0 0 1    Injury with Fall? 0 0 0    Risk for fall due to : Orthopedic patient;History of fall(s) Impaired balance/gait Impaired balance/gait    Follow up Education provided;Falls prevention discussed Falls evaluation completed Falls  evaluation completed      FALL RISK PREVENTION PERTAINING TO THE HOME:  Any stairs in or around the home? Yes  If so, are there any without handrails? No  Home free of loose throw rugs in walkways, pet beds, electrical cords, etc? Yes  Adequate lighting in your home to reduce risk of falls? Yes   ASSISTIVE DEVICES UTILIZED TO PREVENT FALLS:  Life alert? No  Use of a cane, walker or w/c? No  Grab bars in the bathroom? Yes  Shower chair or bench in shower? Yes  Elevated toilet seat or a handicapped toilet? Yes   TIMED UP AND GO:  Was the test performed? No . Telephonic visit  Cognitive Function:    11/29/2020    2:32 PM  MMSE - Mini Mental State Exam  Orientation to time 5  Orientation to Place 5  Registration 3  Attention/ Calculation 4  Recall 3  Language- name 2  objects 2  Language- repeat 1  Language- follow 3 step command 3  Language- read & follow direction 1  Write a sentence 1  Copy design 1  Total score 29        12/02/2021    1:35 PM  6CIT Screen  What Year? 0 points  What month? 0 points  What time? 0 points  Count back from 20 0 points  Months in reverse 0 points  Repeat phrase 0 points  Total Score 0 points    Immunizations Immunization History  Administered Date(s) Administered   Fluad Quad(high Dose 65+) 05/07/2021   Influenza,inj,Quad PF,6+ Mos 05/17/2013, 05/03/2014, 04/25/2015, 04/10/2016, 04/23/2017, 05/05/2018, 04/20/2019, 04/26/2020   Pneumococcal Conjugate-13 11/24/2013   Pneumococcal Polysaccharide-23 04/20/2019   Td 11/11/2009   Tdap 11/11/2009, 01/25/2020   Unspecified SARS-COV-2 Vaccination 09/13/2019, 10/09/2019, 06/24/2020   Zoster Recombinat (Shingrix) 10/23/2021   Zoster, Live 03/03/2011    TDAP status: Up to date  Flu Vaccine status: Up to date  Pneumococcal vaccine status: Up to date  Covid-19 vaccine status: Completed vaccines  Qualifies for Shingles Vaccine? Yes   Zostavax completed Yes   Shingrix Completed?: No.    Education has been provided regarding the importance of this vaccine. Patient has been advised to call insurance company to determine out of pocket expense if they have not yet received this vaccine. Advised may also receive vaccine at local pharmacy or Health Dept. Verbalized acceptance and understanding.  Screening Tests Health Maintenance  Topic Date Due   URINE MICROALBUMIN  01/19/2018   COVID-19 Vaccine (4 - Booster) 08/19/2020   Zoster Vaccines- Shingrix (2 of 2) 12/18/2021   INFLUENZA VACCINE  02/03/2022   FOOT EXAM  02/03/2022   HEMOGLOBIN A1C  04/24/2022   COLON CANCER SCREENING ANNUAL FOBT  06/06/2022   OPHTHALMOLOGY EXAM  09/02/2022   Pneumonia Vaccine 56+ Years old (3 - PPSV23 if available, else PCV20) 04/19/2024   COLONOSCOPY (Pts 45-7yr Insurance coverage  will need to be confirmed)  09/21/2028   TETANUS/TDAP  01/24/2030   Hepatitis C Screening  Completed   HPV VACCINES  Aged Out    Health Maintenance  Health Maintenance Due  Topic Date Due   URINE MICROALBUMIN  01/19/2018   COVID-19 Vaccine (4 - Booster) 08/19/2020    Colorectal cancer screening: Type of screening: Colonoscopy. Completed 09/22/2018. Repeat every 10 years  Lung Cancer Screening: (Low Dose CT Chest recommended if Age 67-80years, 30 pack-year currently smoking OR have quit w/in 15years.) does not qualify.   Additional Screening:  Hepatitis  C Screening: does not qualify; Completed 01/25/2020  Vision Screening: Recommended annual ophthalmology exams for early detection of glaucoma and other disorders of the eye. Is the patient up to date with their annual eye exam?  Yes  Who is the provider or what is the name of the office in which the patient attends annual eye exams? [facility]ana Ho[facility] at [facility] If pt is not established with a provider, would they like to be referred to a provider to establish care? No .   Dental Screening: Recommended annual dental exams for proper oral hygiene  Community Resource Referral / Chronic Care Management: CRR required this visit?  No   CCM required this visit?  No      Plan:     I have personally reviewed and noted the following in the patient's chart:   Medical and social history Use of alcohol, tobacco or illicit drugs  Current medications and supplements including opioid prescriptions. Patient is not currently taking opioid prescriptions. Functional ability and status Nutritional status Physical activity Advanced directives List of other physicians Hospitalizations, surgeries, and ER visits in previous 12 months Vitals Screenings to include cognitive, depression, and falls Referrals and appointments  In addition, I have reviewed and discussed with patient certain preventive protocols, quality metrics, and best practice  recommendations. A written personalized care plan for preventive services as well as general preventive health recommendations were provided to patient.     Sandrea Hammond, LPN   [id number]   Nurse Notes: None

## 2021-12-03 DIAGNOSIS — I10 Essential (primary) hypertension: Secondary | ICD-10-CM

## 2021-12-03 DIAGNOSIS — E785 Hyperlipidemia, unspecified: Secondary | ICD-10-CM

## 2021-12-03 DIAGNOSIS — E119 Type 2 diabetes mellitus without complications: Secondary | ICD-10-CM

## 2021-12-03 DIAGNOSIS — E1169 Type 2 diabetes mellitus with other specified complication: Secondary | ICD-10-CM

## 2021-12-03 NOTE — Patient Instructions (Addendum)
Visit Information  Following are the goals we discussed today:  Current Barriers:  Unable to independently afford treatment regimen Unable to achieve control of T2DM   Pharmacist Clinical Goal(s):  Over the next 90 days, patient will verbalize ability to afford treatment regimen achieve control of T2DM as evidenced by GOAL A1C<7% through collaboration with PharmD and provider.   Interventions: 1:1 collaboration with Dettinger, Fransisca Kaufmann, MD regarding development and update of comprehensive plan of care as evidenced by provider attestation and co-signature Inter-disciplinary care team collaboration (see longitudinal plan of care) Comprehensive medication review performed; medication list updated in electronic medical record  Diabetes: Controlled (A1C 7.9-->6.6%, GFR 53-->48); current treatment: RYBELSUS 14MG , TRESIBA 10 UNITS, glipizide, metformin Denies personal and family history of Medullary thyroid cancer (MTC) DISCONTINUED FARGIXA CONTINUE RYBELSUS, TRESIBA, METFORMIN, GLIPIZIDE  Would like to transition paitnet to ozempic (from rybelsus) and minimize use of glipizide or insulin Insulin requirements have decreased Received notification from [place] regarding RE-ENROLLMENT approval for [place] 14MG . Patient assistance approved from 07/06/21 to 07/05/22. Phone: [phone] Current glucose readings: fasting glucose: <150, post prandial glucose: N/A Denies hypoglycemic/hyperglycemic symptoms Discussed meal planning options and Plate method for healthy eating Avoid sugary drinks and desserts Incorporate balanced protein, non starchy veggies, 1 serving of carbohydrate with each meal Increase water intake Increase physical activity as able Current exercise: ENCOURAGED  Educated on RYBELSUS /GLP1; LDL at goal<70, continue current meds Assessed patient finances. Received notification from [place] regarding RE-ENROLLMENT approval for TRESIBA & RYBELSUS 14MG . Patient  assistance approved from 07/06/21 to 07/05/22.     Patient Goals/Self-Care Activities Over the next 90 days, patient will:  - take medications as prescribed check glucose DAILY (FASTING), document, and provide at future appointments  Follow Up Plan: Telephone follow up appointment with care management team member scheduled for: 3 MONTHs   Plan: Telephone follow up appointment with care management team member scheduled for:  3 months  Signature Regina Eck, PharmD, BCPS Clinical Pharmacist, [place]  [facility] Phone [phone]   Please call the care guide team at [phone] if you need to cancel or reschedule your appointment.   Patient verbalizes understanding of instructions and care plan provided today and agrees to view in [place]. Active MyChart status and patient understanding of how to access instructions and care plan via MyChart confirmed with patient.

## 2021-12-03 NOTE — Progress Notes (Signed)
Chronic Care Management Pharmacy Note  11/19/2021 Name:  [name] [name] MRN:  [id number] DOB:  [birth year]  Summary:  Diabetes: Controlled (A1C 7.9-->6.6%, GFR 53-->48); current treatment: RYBELSUS 14MG , TRESIBA 10 UNITS, glipizide, metformin Denies personal and family history of Medullary thyroid cancer (MTC) DISCONTINUED FARGIXA CONTINUE RYBELSUS, TRESIBA, METFORMIN, GLIPIZIDE  Would like to transition paitnet to ozempic (from rybelsus) and minimize use of glipizide or insulin Insulin requirements have decreased Received notification from [place] regarding RE-ENROLLMENT approval for [place] 14MG . Patient assistance approved from 07/06/21 to 07/05/22. Phone: [phone] Current glucose readings: fasting glucose: <150, post prandial glucose: N/A Denies hypoglycemic/hyperglycemic symptoms Discussed meal planning options and Plate method for healthy eating Avoid sugary drinks and desserts Incorporate balanced protein, non starchy veggies, 1 serving of carbohydrate with each meal Increase water intake Increase physical activity as able Current exercise: ENCOURAGED  Educated on RYBELSUS /GLP1; LDL at goal<70, continue current meds Assessed patient finances. Received notification from [place] regarding RE-ENROLLMENT approval for TRESIBA & RYBELSUS 14MG . Patient assistance approved from 07/06/21 to 07/05/22.    Subjective: [name] [name] is an 66 y.o. year old male who is a primary patient of Dettinger, Fransisca Kaufmann, MD.  The CCM team was consulted for assistance with disease management and care coordination needs.    Engaged with patient by telephone for follow up visit in response to provider referral for pharmacy case management and/or care coordination services.   Consent to Services:  The patient was given information about Chronic Care Management services, agreed to services, and gave verbal consent prior to initiation of services.  Please see initial visit note for  detailed documentation.   Patient Care Team: Dettinger, Fransisca Kaufmann, MD as PCP - General (Family Medicine) Arnoldo Lenis, MD as PCP - Cardiology (Cardiology) Lavera Guise, [facility] (Pharmacist)  Objective:  Lab Results  Component Value Date   CREATININE 1.59 (H) 07/24/2021   CREATININE 1.44 (H) 07/17/2021   CREATININE 1.42 (H) 07/17/2021    Lab Results  Component Value Date   HGBA1C 6.6 (H) 10/23/2021   Last diabetic Eye exam:  Lab Results  Component Value Date/Time   HMDIABEYEEXA No Retinopathy 09/02/2021 12:00 AM    Last diabetic Foot exam: No results found for: HMDIABFOOTEX      Component Value Date/Time   CHOL 102 02/03/2021 1138   CHOL 76 12/05/2012 0931   TRIG 119 02/03/2021 1138   TRIG 180 (H) 01/19/2017 1131   TRIG 43 12/05/2012 0931   HDL 29 (L) 02/03/2021 1138   HDL 28 (L) 01/19/2017 1131   HDL 36 (L) 12/05/2012 0931   CHOLHDL 3.5 02/03/2021 1138   LDLCALC 51 02/03/2021 1138   LDLCALC 50 02/28/2014 1116   LDLCALC 31 12/05/2012 0931       Latest Ref Rng & Units 07/24/2021    3:48 PM 07/17/2021    5:02 AM 07/14/2021    5:00 AM  Hepatic Function  Total Protein 6.0 - 8.5 g/dL 7.0    7.2    Albumin 3.8 - 4.8 g/dL 4.1   2.7     2.7   3.4    AST 0 - 40 IU/L 22    25    ALT 0 - 44 IU/L 29    39    Alk Phosphatase 44 - 121 IU/L 77    71    Total Bilirubin 0.0 - 1.2 mg/dL 0.3    0.7      Lab Results  Component Value Date/Time   TSH 1.740 03/26/2015 09:24 AM       Latest Ref Rng & Units 07/24/2021    3:48 PM 07/16/2021    5:38 AM 07/15/2021    4:21 AM  CBC  WBC 3.4 - 10.8 x10E3/uL 6.2   6.9   7.7    Hemoglobin 13.0 - 17.7 g/dL 11.5   11.2   11.5    Hematocrit 37.5 - 51.0 % 34.0   33.3   33.8    Platelets 150 - 450 x10E3/uL 209   139   180      Lab Results  Component Value Date/Time   VD25OH 48.5 12/21/2018 08:18 AM   VD25OH 31.8 08/02/2018 04:50 PM    Clinical ASCVD: No  The ASCVD Risk score (Arnett DK, et al., 2019) failed to calculate for  the following reasons:   The valid total cholesterol range is 130 to 320 mg/dL    Other: (CHADS2VASc if Afib, PHQ9 if depression, MMRC or CAT for COPD, ACT, DEXA)  Social History   Tobacco Use  Smoking Status Former   Packs/day: 1.50   Years: 25.00   Pack years: 37.50   Types: Cigarettes   Start date: 07/06/1974   Quit date: 07/06/2002   Years since quitting: 19.4  Smokeless Tobacco Never   BP Readings from Last 3 Encounters:  10/23/21 (!) 147/72  10/17/21 (!) 144/80  09/30/21 138/79   Pulse Readings from Last 3 Encounters:  10/23/21 69  10/17/21 64  09/30/21 65   Wt Readings from Last 3 Encounters:  12/02/21 193 lb (87.5 kg)  10/23/21 193 lb (87.5 kg)  10/17/21 195 lb 6.4 oz (88.6 kg)    Assessment: Review of patient past medical history, allergies, medications, health status, including review of consultants reports, laboratory and other test data, was performed as part of comprehensive evaluation and provision of chronic care management services.   SDOH:  (Social Determinants of Health) assessments and interventions performed:    CCM Care Plan  Allergies  Allergen Reactions   Glyxambi [Empagliflozin-Linagliptin] Diarrhea and Itching    He was able to take Jardiance alone with no side effects so it was the Tradjenta part that most likely gave him the reaction   Hctz [Hydrochlorothiazide]     Nausea and headache    Sulfa Antibiotics Nausea And Vomiting   Sulfonamide Derivatives     Medications Reviewed Today     Reviewed by Lavera Guise, [facility] (Pharmacist) on 12/03/21 at 59  Med List Status: <None>   Medication Order Taking? Sig Documenting Provider Last Dose Status Informant  acetaminophen (TYLENOL) 325 MG tablet [id number] No Take 2 tablets (650 mg total) by mouth every 6 (six) hours as needed for mild pain (or Fever >/= 101). Roxan Hockey, MD Taking Active   apixaban (ELIQUIS) 5 MG TABS tablet [id number] No Take 1 tablet (5 mg total) by mouth 2 (two) times  daily. Roxan Hockey, MD Taking Active   atenolol (TENORMIN) 25 MG tablet [id number] No Take 1 tablet (25 mg total) by mouth daily. Roxan Hockey, MD Taking Active   atorvastatin (LIPITOR) 40 MG tablet [id number] No Take 1 tablet (40 mg total) by mouth daily. Dettinger, Fransisca Kaufmann, MD Taking Active Spouse/Significant Other, Self  blood glucose meter kit and supplies KIT [id number] No Dispense based on patient and insurance preference. Use up to four times daily as directed. (FOR ICD-9 250.00, 250.01). Dettinger, Fransisca Kaufmann, MD Taking Active Spouse/Significant Other, Self  cholecalciferol (VITAMIN D)  1000 UNITS tablet [id number] No Take 2,000 Units by mouth 2 (two) times daily. [provider] Taking Active Spouse/Significant Other, Self           Med Note Rosalyn Gess   Fri Jul 04, 2021  3:45 PM)    diltiazem (CARDIZEM) 30 MG tablet [id number] No Take 1 tablet (30 mg total) by mouth 2 (two) times daily. Arnoldo Lenis, MD Taking Active   fluticasone [facility]) 50 MCG/ACT nasal spray [id number] No Place 2 sprays into the nose daily as needed for allergies.   Patient not taking: Reported on 12/02/2021   Chipper Herb, MD Not Taking Active Spouse/Significant Other, Self  glipiZIDE (GLUCOTROL) 5 MG tablet [id number] No Take 2 tablets (10 mg total) by mouth 2 (two) times daily before a meal. Dettinger, Fransisca Kaufmann, MD Taking Active Spouse/Significant Other, Self  insulin degludec (TRESIBA FLEXTOUCH) 100 UNIT/ML FlexTouch Pen [id number] No Inject 10 Units into the skin daily. Roxan Hockey, MD Taking Active   Insulin Pen Needle (GLOBAL EASE INJECT PEN NEEDLES) 31G X 5 MM MISC [id number] No Use daily as directed Dx E11.69 Dettinger, Fransisca Kaufmann, MD Taking Active Spouse/Significant Other, Self  Lancets (ONETOUCH DELICA PLUS [id number]) [place] [id number] No 4 (four) times daily. [provider] Taking Active Spouse/Significant Other, Self  metFORMIN (GLUCOPHAGE) 1000 MG tablet [id number] No Take 1  tablet (1,000 mg total) by mouth 2 (two) times daily with a meal. Emokpae, Courage, MD Taking Active   ondansetron (ZOFRAN-ODT) 4 MG disintegrating tablet [id number] No DISSOLVE ONE TABLET BY MOUTH EVERY 8 HOURS AS NEEDED FOR NAUSEA AND VOMITING  Patient not taking: Reported on 12/02/2021   Janora Norlander, DO Not Taking Active   [facility] ULTRA test strip [id number] No Test BS 4 times daily Dx E11.69 Dettinger, Fransisca Kaufmann, MD Taking Active Spouse/Significant Other, Self  Semaglutide (RYBELSUS) 14 MG TABS [id number] No Take 14 mg by mouth daily. Dettinger, Fransisca Kaufmann, MD Taking Active Spouse/Significant Other, Self           Med Note Blanca Friend, Royce Macadamia   Fri May 02, 2021 10:30 AM) VIA NOVO [facility]ORDIS[facility] PATIENT ASSISTANCE PROGRAM  Simethicone (GAS-X PO) [id number] No Take 1 capsule by mouth daily as needed.  Patient not taking: Reported on 12/02/2021   [provider] Not Taking Active Spouse/Significant Other, Self           Med Note Rosalyn Gess   Fri Jul 04, 2021  3:41 PM)    temazepam (RESTORIL) 15 MG capsule [id number] No TAKE ONE CAPSULE BY MOUTH AT BEDTIME AS NEEDED  Patient not taking: Reported on 12/02/2021   Dettinger, Fransisca Kaufmann, MD Not Taking Active Spouse/Significant Other, Self            Patient Active Problem List   Diagnosis Date Noted   Sensorineural hearing loss (SNHL), bilateral 02/08/2020   Tinnitus of both ears 02/08/2020   Irritable bowel syndrome 03/09/2019   Common bile duct stone 11/24/2016   Pulmonary nodules 11/18/2016   Type 2 diabetes mellitus with hyperlipidemia ([place]) 12/04/2015   GERD (gastroesophageal reflux disease) 03/08/2014   NAFLD (nonalcoholic fatty liver disease) 03/08/2014   Ascending aortic aneurysm ([place]) 12/12/2012   INSOMNIA 09/15/2010   Atrial fibrillation, chronic ([place]) 09/15/2010   CAD (coronary artery disease) 07/09/2009   Aortic valve disorder 05/27/2009   Hyperlipidemia with target LDL less than 70 05/24/2009   Essential hypertension  05/24/2009   Unspecified sleep apnea 05/24/2009    Immunization History  Administered  Date(s) Administered   Fluad Quad(high Dose 65+) 05/07/2021   Influenza,inj,Quad PF,6+ Mos 05/17/2013, 05/03/2014, 04/25/2015, 04/10/2016, 04/23/2017, 05/05/2018, 04/20/2019, 04/26/2020   Pneumococcal Conjugate-13 11/24/2013   Pneumococcal Polysaccharide-23 04/20/2019   Td 11/11/2009   Tdap 11/11/2009, 01/25/2020   Unspecified SARS-COV-2 Vaccination 09/13/2019, 10/09/2019, 06/24/2020   Zoster Recombinat (Shingrix) 10/23/2021   Zoster, Live 03/03/2011    Conditions to be addressed/monitored: DMII  Care Plan : PHARMD MEDICATION MANAGEMENT  Updates made by Lavera Guise, [place] since 12/03/2021 12:00 AM     Problem: DISEASE PROGRESSION PREVENTION      Long-Range Goal: T2DM   Recent Progress: On track  Priority: High  Note:   Current Barriers:  Unable to independently afford treatment regimen Unable to achieve control of T2DM   Pharmacist Clinical Goal(s):  Over the next 90 days, patient will verbalize ability to afford treatment regimen achieve control of T2DM as evidenced by GOAL A1C<7%  through collaboration with PharmD and provider.   Interventions: 1:1 collaboration with Dettinger, Fransisca Kaufmann, MD regarding development and update of comprehensive plan of care as evidenced by provider attestation and co-signature Inter-disciplinary care team collaboration (see longitudinal plan of care) Comprehensive medication review performed; medication list updated in electronic medical record  Diabetes: Controlled (A1C 7.9-->6.6%, GFR 53-->48); current treatment: RYBELSUS 14MG , TRESIBA 10 UNITS, glipizide, metformin Denies personal and family history of Medullary thyroid cancer (MTC) DISCONTINUED FARGIXA CONTINUE RYBELSUS, TRESIBA, METFORMIN, GLIPIZIDE  Would like to transition paitnet to ozempic (from rybelsus) and minimize use of glipizide or insulin Insulin requirements have decreased Received  notification from [place] regarding RE-ENROLLMENT approval for [place] 14MG . Patient assistance approved from 07/06/21 to 07/05/22. Phone: [phone] Current glucose readings: fasting glucose: <150, post prandial glucose: N/A Denies hypoglycemic/hyperglycemic symptoms Discussed meal planning options and Plate method for healthy eating Avoid sugary drinks and desserts Incorporate balanced protein, non starchy veggies, 1 serving of carbohydrate with each meal Increase water intake Increase physical activity as able Current exercise: ENCOURAGED  Educated on RYBELSUS /GLP1; LDL at goal<70, continue current meds Assessed patient finances. Received notification from [place] regarding RE-ENROLLMENT approval for TRESIBA & RYBELSUS 14MG . Patient assistance approved from 07/06/21 to 07/05/22.     Patient Goals/Self-Care Activities Over the next 90 days, patient will:  - take medications as prescribed check glucose DAILY (FASTING), document, and provide at future appointments  Follow Up Plan: Telephone follow up appointment with care management team member scheduled for: 3 MONTHs     Plan: Telephone follow up appointment with care management team member scheduled for:  4 months    Regina Eck, PharmD, BCPS Clinical Pharmacist, [place]  [facility] Phone [phone]

## 2021-12-16 ENCOUNTER — Encounter: Payer: Self-pay | Admitting: Pharmacist

## 2022-01-13 ENCOUNTER — Encounter: Payer: Self-pay | Admitting: Family Medicine

## 2022-01-13 MED ORDER — APIXABAN 5 MG PO TABS: 5.0000 mg | ORAL_TABLET | Freq: Two times a day (BID) | ORAL | 1 refills | Status: DC

## 2022-02-03 ENCOUNTER — Telehealth: Payer: HMO

## 2022-02-05 ENCOUNTER — Ambulatory Visit (INDEPENDENT_AMBULATORY_CARE_PROVIDER_SITE_OTHER): Payer: PPO | Admitting: Family Medicine

## 2022-02-05 ENCOUNTER — Encounter: Payer: Self-pay | Admitting: Family Medicine

## 2022-02-05 VITALS — BP 136/77 | HR 66 | Temp 98.0°F | Ht 70.0 in | Wt 188.0 lb

## 2022-02-05 DIAGNOSIS — Z23 Encounter for immunization: Secondary | ICD-10-CM

## 2022-02-05 DIAGNOSIS — E1169 Type 2 diabetes mellitus with other specified complication: Secondary | ICD-10-CM | POA: Diagnosis not present

## 2022-02-05 DIAGNOSIS — E785 Hyperlipidemia, unspecified: Secondary | ICD-10-CM

## 2022-02-05 DIAGNOSIS — G4709 Other insomnia: Secondary | ICD-10-CM | POA: Diagnosis not present

## 2022-02-05 DIAGNOSIS — I10 Essential (primary) hypertension: Secondary | ICD-10-CM | POA: Diagnosis not present

## 2022-02-05 LAB — LIPID PANEL
Chol/HDL Ratio: 3.2 ratio (ref 0.0–5.0)
Cholesterol, Total: 94 mg/dL — ABNORMAL LOW (ref 100–199)
HDL: 29 mg/dL — ABNORMAL LOW (ref >39)
LDL Chol Calc (NIH): 46 mg/dL (ref 0–99)
Triglycerides: 97 mg/dL (ref 0–149)
VLDL Cholesterol Cal: 19 mg/dL (ref 5–40)

## 2022-02-05 LAB — CMP14+EGFR
ALT: 22 IU/L (ref 0–44)
AST: 18 IU/L (ref 0–40)
Albumin/Globulin Ratio: 2.3 — ABNORMAL HIGH (ref 1.2–2.2)
Albumin: 4.8 g/dL (ref 3.9–4.9)
Alkaline Phosphatase: 77 IU/L (ref 44–121)
BUN/Creatinine Ratio: 19 (ref 10–24)
BUN: 30 mg/dL — ABNORMAL HIGH (ref 8–27)
Bilirubin Total: 0.7 mg/dL (ref 0.0–1.2)
CO2: 21 mmol/L (ref 20–29)
Calcium: 10.3 mg/dL — ABNORMAL HIGH (ref 8.6–10.2)
Chloride: 105 mmol/L (ref 96–106)
Creatinine, Ser: 1.6 mg/dL — ABNORMAL HIGH (ref 0.76–1.27)
Globulin, Total: 2.1 g/dL (ref 1.5–4.5)
Glucose: 103 mg/dL — ABNORMAL HIGH (ref 70–99)
Potassium: 4.7 mmol/L (ref 3.5–5.2)
Sodium: 144 mmol/L (ref 134–144)
Total Protein: 6.9 g/dL (ref 6.0–8.5)
eGFR: 47 mL/min/{1.73_m2} — ABNORMAL LOW (ref >59)

## 2022-02-05 LAB — CBC WITH DIFFERENTIAL/PLATELET
Basophils Absolute: 0 10*3/uL (ref 0.0–0.2)
Basos: 1 %
EOS (ABSOLUTE): 0.2 10*3/uL (ref 0.0–0.4)
Eos: 2 %
Hematocrit: 37.6 % (ref 37.5–51.0)
Hemoglobin: 12.8 g/dL — ABNORMAL LOW (ref 13.0–17.7)
Immature Grans (Abs): 0 10*3/uL (ref 0.0–0.1)
Immature Granulocytes: 0 %
Lymphocytes Absolute: 1.2 10*3/uL (ref 0.7–3.1)
Lymphs: 18 %
MCH: 31.5 pg (ref 26.6–33.0)
MCHC: 34 g/dL (ref 31.5–35.7)
MCV: 93 fL (ref 79–97)
Monocytes Absolute: 0.6 10*3/uL (ref 0.1–0.9)
Monocytes: 10 %
Neutrophils Absolute: 4.5 10*3/uL (ref 1.4–7.0)
Neutrophils: 69 %
Platelets: 182 10*3/uL (ref 150–450)
RBC: 4.06 x10E6/uL — ABNORMAL LOW (ref 4.14–5.80)
RDW: 13.9 % (ref 11.6–15.4)
WBC: 6.6 10*3/uL (ref 3.4–10.8)

## 2022-02-05 LAB — BAYER DCA HB A1C WAIVED: HB A1C (BAYER DCA - WAIVED): 7 % — ABNORMAL HIGH (ref 4.8–5.6)

## 2022-02-05 MED ORDER — RYBELSUS 14 MG PO TABS: 14.0000 mg | ORAL_TABLET | Freq: Every day | ORAL | 1 refills | Status: DC

## 2022-02-05 MED ORDER — GLOBAL EASE INJECT PEN NEEDLES 31G X 5 MM MISC: 3 refills | Status: DC

## 2022-02-05 MED ORDER — TEMAZEPAM 15 MG PO CAPS: ORAL_CAPSULE | ORAL | 1 refills | Status: DC

## 2022-02-05 MED ORDER — APIXABAN 5 MG PO TABS: 5.0000 mg | ORAL_TABLET | Freq: Two times a day (BID) | ORAL | 1 refills | Status: DC

## 2022-02-05 MED ORDER — ONETOUCH ULTRA VI STRP: ORAL_STRIP | 3 refills | Status: DC

## 2022-02-05 NOTE — Addendum Note (Signed)
Addended by: Dorene Sorrow on: 02/05/2022 11:48 AM   Modules accepted: Orders

## 2022-02-05 NOTE — Progress Notes (Signed)
BP 136/77   Pulse 66   Temp 98 F (36.7 C)   Ht 5\' 10"  (1.778 m)   Wt 188 lb (85.3 kg)   SpO2 97%   BMI 26.98 kg/m    Subjective:   Patient ID: , male    DOB: 1955/08/26, 66 y.o.   MRN: [id number]  HPI: [name] [name] is a 66 y.o. male presenting on 02/05/2022 for Medical Management of Chronic Issues and Diabetes   HPI Hypertension Patient is currently on atenolol and diltiazem, and their blood pressure today is 136/77. Patient denies any lightheadedness or dizziness. Patient denies headaches, blurred vision, chest pains, shortness of breath, or weakness. Denies any side effects from medication and is content with current medication.   Hyperlipidemia Patient is coming in for recheck of his hyperlipidemia. The patient is currently taking atorvastatin. They deny any issues with myalgias or history of liver damage from it. They deny any focal numbness or weakness or chest pain.   Type 2 diabetes mellitus Patient comes in today for recheck of his diabetes. Patient has been currently taking glipizide and metformin and Rybelsus and 04/07/2022. Patient is not currently on an ACE inhibitor/ARB. Patient has not seen an ophthalmologist this year. Patient denies any issues with their feet. The symptom started onset as an adult hypertension and hyperlipidemia ARE RELATED TO DM   Insomnia recheck Current rx-temazepam 15 mg nightly as needed, uses very infrequently # meds rx-30/month Effectiveness of current meds-works well Adverse reactions form meds-none  Pill count performed-No Last drug screen -02/11/2021 ( high risk q48m, moderate risk q39m, low risk yearly ) Urine drug screen today-no Was the NCCSR reviewed-yes  If yes were their any concerning findings? -Only filled once since 02/11/2021, uses infrequently, not concerning  No flowsheet data found.   Controlled substance contract signed on: 02/11/2021  Relevant past medical, surgical, family and social history reviewed and  updated as indicated. Interim medical history since our last visit reviewed. Allergies and medications reviewed and updated.  Review of Systems  Constitutional:  Negative for chills and fever.  Eyes:  Negative for visual disturbance.  Respiratory:  Negative for shortness of breath and wheezing.   Cardiovascular:  Negative for chest pain and leg swelling.  Musculoskeletal:  Positive for arthralgias (Mainly in left hip from osteoarthritis). Negative for back pain and gait problem.  Skin:  Negative for rash.  Neurological:  Negative for dizziness, weakness and numbness.  All other systems reviewed and are negative.   Per HPI unless specifically indicated above   Allergies as of 02/05/2022       Reactions   Glyxambi [empagliflozin-linagliptin] Diarrhea, Itching   He was able to take Jardiance alone with no side effects so it was the Tradjenta part that most likely gave him the reaction   Hctz [hydrochlorothiazide]    Nausea and headache    Sulfa Antibiotics Nausea And Vomiting   Sulfonamide Derivatives         Medication List        Accurate as of February 05, 2022 10:03 AM. If you have any questions, ask your nurse or doctor.          acetaminophen 325 MG tablet Commonly known as: TYLENOL Take 2 tablets (650 mg total) by mouth every 6 (six) hours as needed for mild pain (or Fever >/= 101).   apixaban 5 MG Tabs tablet Commonly known as: ELIQUIS Take 1 tablet (5 mg total) by mouth 2 (two) times daily.  atenolol 25 MG tablet Commonly known as: TENORMIN Take 1 tablet (25 mg total) by mouth daily.   atorvastatin 40 MG tablet Commonly known as: LIPITOR Take 1 tablet (40 mg total) by mouth daily.   blood glucose meter kit and supplies Kit Dispense based on patient and insurance preference. Use up to four times daily as directed. (FOR ICD-9 250.00, 250.01).   cholecalciferol 1000 units tablet Commonly known as: VITAMIN D Take 2,000 Units by mouth 2 (two) times daily.    diltiazem 30 MG tablet Commonly known as: Cardizem Take 1 tablet (30 mg total) by mouth 2 (two) times daily.   fluticasone 50 MCG/ACT nasal spray Commonly known as: FLONASE Place 2 sprays into the nose daily as needed for allergies.   GAS-X PO Take 1 capsule by mouth daily as needed.   glipiZIDE 5 MG tablet Commonly known as: GLUCOTROL Take 2 tablets (10 mg total) by mouth 2 (two) times daily before a meal.   Global Ease Inject Pen Needles 31G X 5 MM Misc Generic drug: Insulin Pen Needle Use daily as directed Dx E11.69   metFORMIN 1000 MG tablet Commonly known as: GLUCOPHAGE Take 1 tablet (1,000 mg total) by mouth 2 (two) times daily with a meal.   ondansetron 4 MG disintegrating tablet Commonly known as: ZOFRAN-ODT DISSOLVE ONE TABLET BY MOUTH EVERY 8 HOURS AS NEEDED FOR NAUSEA AND VOMITING   OneTouch Delica Plus [id number] Misc 4 (four) times daily.   OneTouch Ultra test strip Generic drug: glucose blood Test BS 4 times daily Dx E11.69   Rybelsus 14 MG Tabs Generic drug: Semaglutide Take 1 tablet (14 mg total) by mouth daily.   temazepam 15 MG capsule Commonly known as: RESTORIL TAKE ONE CAPSULE BY MOUTH AT BEDTIME AS NEEDED   Tresiba FlexTouch 100 UNIT/ML FlexTouch Pen Generic drug: insulin degludec Inject 10 Units into the skin daily.         Objective:   BP 136/77   Pulse 66   Temp 98 F (36.7 C)   Ht $R'5\' 10"'fu$  (1.778 m)   Wt 188 lb (85.3 kg)   SpO2 97%   BMI 26.98 kg/m   Wt Readings from Last 3 Encounters:  02/05/22 188 lb (85.3 kg)  12/02/21 193 lb (87.5 kg)  10/23/21 193 lb (87.5 kg)    Physical Exam Vitals and nursing note reviewed.  Constitutional:      General: He is not in acute distress.    Appearance: He is well-developed. He is not diaphoretic.  Eyes:     General: No scleral icterus.    Conjunctiva/sclera: Conjunctivae normal.  Neck:     Thyroid: No thyromegaly.  Cardiovascular:     Rate and Rhythm: Normal rate and regular  rhythm.     Heart sounds: Normal heart sounds. No murmur heard. Pulmonary:     Effort: Pulmonary effort is normal. No respiratory distress.     Breath sounds: Normal breath sounds. No wheezing.  Musculoskeletal:     Cervical back: Neck supple.  Lymphadenopathy:     Cervical: No cervical adenopathy.  Skin:    General: Skin is warm and dry.     Findings: No rash.  Neurological:     Mental Status: He is alert and oriented to person, place, and time.     Coordination: Coordination normal.  Psychiatric:        Behavior: Behavior normal.       Assessment & Plan:   Problem List Items Addressed This Visit  Cardiovascular and Mediastinum   Essential hypertension   Relevant Medications   apixaban (ELIQUIS) 5 MG TABS tablet   Other Relevant Orders   CBC with Differential/Platelet   CMP14+EGFR   Lipid panel   Bayer DCA Hb A1c Waived   Microalbumin / creatinine urine ratio     Endocrine   Type 2 diabetes mellitus with hyperlipidemia (HCC) - Primary   Relevant Medications   apixaban (ELIQUIS) 5 MG TABS tablet   Insulin Pen Needle (GLOBAL EASE INJECT PEN NEEDLES) 31G X 5 MM MISC   Semaglutide (RYBELSUS) 14 MG TABS   Other Relevant Orders   CBC with Differential/Platelet   CMP14+EGFR   Lipid panel   Bayer DCA Hb A1c Waived     Other   Hyperlipidemia with target LDL less than 70   Relevant Medications   apixaban (ELIQUIS) 5 MG TABS tablet   Other Relevant Orders   CBC with Differential/Platelet   CMP14+EGFR   Lipid panel   Bayer DCA Hb A1c Waived   INSOMNIA   Relevant Medications   temazepam (RESTORIL) 15 MG capsule    Continue current medicine, he did say that the Eliquis is starting to get expensive because he is in the donut hole.  Instructions given.  Give samples through the donut hole time. Follow up plan: Return in about 3 months (around 05/08/2022), or if symptoms worsen or fail to improve, for Diabetes hypertension.  Counseling provided for all of the  vaccine components Orders Placed This Encounter  Procedures   CBC with Differential/Platelet   CMP14+EGFR   Lipid panel   Bayer DCA Hb A1c Waived   Microalbumin / creatinine urine ratio    Caryl Pina, MD [facility] 02/05/2022, 10:03 AM

## 2022-02-06 ENCOUNTER — Telehealth: Payer: HMO

## 2022-02-06 LAB — MICROALBUMIN / CREATININE URINE RATIO
Creatinine, Urine: 108.7 mg/dL
Microalb/Creat Ratio: 26 mg/g creat (ref 0–29)
Microalbumin, Urine: 28.1 ug/mL

## 2022-02-10 ENCOUNTER — Telehealth: Payer: Self-pay | Admitting: Family Medicine

## 2022-02-10 DIAGNOSIS — E1169 Type 2 diabetes mellitus with other specified complication: Secondary | ICD-10-CM

## 2022-02-10 DIAGNOSIS — I482 Chronic atrial fibrillation, unspecified: Secondary | ICD-10-CM

## 2022-02-10 NOTE — Telephone Encounter (Signed)
[name] [name],  You already help this pt with Rybelsus. A referral to CCM was placed June of 2022. Is it correct that he needs a new referral to discuss and updated financial info? For some reason I am thinking that the pt will not be able to get assistance with Eliquis???

## 2022-02-11 NOTE — Telephone Encounter (Signed)
Patient has been informed. New referral to CCM placed.

## 2022-02-16 ENCOUNTER — Telehealth: Payer: Self-pay | Admitting: Cardiology

## 2022-02-16 ENCOUNTER — Ambulatory Visit (INDEPENDENT_AMBULATORY_CARE_PROVIDER_SITE_OTHER): Payer: HMO | Admitting: Cardiology

## 2022-02-16 ENCOUNTER — Encounter: Payer: Self-pay | Admitting: Cardiology

## 2022-02-16 VITALS — BP 138/70 | HR 56 | Ht 70.0 in | Wt 188.8 lb

## 2022-02-16 DIAGNOSIS — I4891 Unspecified atrial fibrillation: Secondary | ICD-10-CM | POA: Diagnosis not present

## 2022-02-16 DIAGNOSIS — I10 Essential (primary) hypertension: Secondary | ICD-10-CM

## 2022-02-16 DIAGNOSIS — Z952 Presence of prosthetic heart valve: Secondary | ICD-10-CM

## 2022-02-16 NOTE — Patient Instructions (Addendum)
Medication Instructions:  Your physician recommends that you continue on your current medications as directed. Please refer to the Current Medication list given to you today.   Labwork: none  Testing/Procedures: Your physician has recommended that you wear an event monitor. Event monitors are medical devices that record the heart's electrical activity. Doctors most often [place] these monitors to diagnose arrhythmias. Arrhythmias are problems with the speed or rhythm of the heartbeat. The monitor is a small, portable device. You can wear one while you do your normal daily activities. This is usually used to diagnose what is causing palpitations/syncope (passing out).   Follow-Up:  Your physician recommends that you schedule a follow-up appointment in: 6 months  Any Other Special Instructions Will Be Listed Below (If Applicable).  If you need a refill on your cardiac medications before your next appointment, please call your pharmacy.

## 2022-02-16 NOTE — Telephone Encounter (Signed)
PERCERT:  30 DAY MONITOR   

## 2022-02-16 NOTE — Progress Notes (Signed)
Clinical Summary [name] [name] is a 66 y.o.male seen today for follow up of the following medical problems      1. AVR - history of bicuspid AV, s/p bioprosthetic AVR in 2012 Jan 2023 echo normal AVR  - some SOB at times, though reports walking in general more difficult due to hip pains - ridies stationary 77min to 1 hr without any exertional symptoms.     2. Chest pain - nonspecific ache midchest, usually occurs at rest. Lasts a few minutes. Not positional. 3-4/10 in severity, occurs about twice a month.     3. Ascending aortic aneurysm - mild aortic root dilatation at 4.1 to 4.2 cm by last MRI 10/2013, likely related to hx of bicuspid AV and potential coexisting aortopathy - CTA 10/2014 4.1 cm ascending aortic aneurysm.   - CTA 11/2016 stable 4 cm aneurysm   10/2017 CTA Chest/Abd/Pelvis Sovah (in epic labeled as CT A/P): 4 cm aneurysm - no recent chest pain   01/2019 CTA stable 3.7 cm(reported overestimated by prior imaging) 01/2020 CTA 3.9 cm acending aorta, overall stable 06/2021 CT PE: no aneurysm   3. HTN - on higher dose of lisinopril had symptomatic low bp's - recent lightheadedness, pcp lowered diltiazem.   - isolated high bp at night 180/80. Felt shaky, some dizziness. Did not check blood sugar - home 130s/60s-70s - no other issues with lightheadedness or dizziness     4. OSA - not compliant with CPAP due to discomfort. Has not been intrested in retyring     5. Hyperlipidemia - Jan 2022 TC 110 TG 122 HDL 28 LDL 60 - compliant with statin  02/2022 TC 94 TG 97 HDL 29 LDL 46   6. DM2 - followed by pcp       7. Pulmonary  Nodules - have been stable by imaging, monitored along with his aneurysm with his annual CT chests   8. Carotid stenosis - followed by vascular 09/2021 carotid US: mod bilateral disease    9. COVID pneumonia 06/2021 - severeal day hospital admission, discharged on O2. He is now off.    10. PAF - new diagnosis during Jan 2023  admission - had just been discharged for severe case of COVID pneumonia, readmitted few days later with dehydration and AKI, found to be in afib with RVR.  - he asymptomatic - no bleeding on eliqus - dizziness on dilt 120.          Has one grandchild, 61 yo girl Past Medical History:  Diagnosis Date   Aortic stenosis 8/09   Bicuspid aortic valve   Arthritis 6/10   hand    Atrial fibrillation ([place])    post op only   Bile duct calculus with acute cholecystitis    CAD (coronary artery disease) 8/09   Calculus of common bile duct with acute pancreatitis 11/2016   CTS (carpal tunnel syndrome)    bilateral    Diabetes ([place])    Diastolic dysfunction 8/18   Elevated LFTs    Fatty liver disease, nonalcoholic    History of kidney stones    Hyperlipidemia    Hypertension    Kidney stones    Nasal congestion    NIDDM (non-insulin dependent diabetes mellitus)    x15 yrs   OSA on CPAP 2006   Paresthesias in left hand 02/19/2004   Paresthesias in right hand 02/19/2004   Sinusitis    Vitamin D deficiency 06/13/09     Allergies  Allergen Reactions  Glyxambi [Empagliflozin-Linagliptin] Diarrhea and Itching    He was able to take Jardiance alone with no side effects so it was the Tradjenta part that most likely gave him the reaction   Hctz [Hydrochlorothiazide]     Nausea and headache    Sulfa Antibiotics Nausea And Vomiting   Sulfonamide Derivatives      Current Outpatient Medications  Medication Sig Dispense Refill   acetaminophen (TYLENOL) 325 MG tablet Take 2 tablets (650 mg total) by mouth every 6 (six) hours as needed for mild pain (or Fever >/= 101). 12 tablet 0   apixaban (ELIQUIS) 5 MG TABS tablet Take 1 tablet (5 mg total) by mouth 2 (two) times daily. 180 tablet 1   atenolol (TENORMIN) 25 MG tablet Take 1 tablet (25 mg total) by mouth daily. 90 tablet 3   atorvastatin (LIPITOR) 40 MG tablet Take 1 tablet (40 mg total) by mouth daily. 90 tablet 3   blood glucose meter  kit and supplies KIT Dispense based on patient and insurance preference. Use up to four times daily as directed. (FOR ICD-9 250.00, 250.01). 1 each 0   cholecalciferol (VITAMIN D) 1000 UNITS tablet Take 2,000 Units by mouth 2 (two) times daily.     diltiazem (CARDIZEM) 30 MG tablet Take 1 tablet (30 mg total) by mouth 2 (two) times daily. 60 tablet 6   fluticasone (FLONASE) 50 MCG/ACT nasal spray Place 2 sprays into the nose daily as needed for allergies.     glipiZIDE (GLUCOTROL) 5 MG tablet Take 2 tablets (10 mg total) by mouth 2 (two) times daily before a meal. 360 tablet 3   insulin degludec (TRESIBA FLEXTOUCH) 100 UNIT/ML FlexTouch Pen Inject 10 Units into the skin daily. 15 mL 2   Insulin Pen Needle (GLOBAL EASE INJECT PEN NEEDLES) 31G X 5 MM MISC Use daily as directed Dx E11.69 100 each 3   Lancets (ONETOUCH DELICA PLUS [id number]) MISC 4 (four) times daily.     metFORMIN (GLUCOPHAGE) 1000 MG tablet Take 1 tablet (1,000 mg total) by mouth 2 (two) times daily with a meal. 180 tablet 3   ondansetron (ZOFRAN-ODT) 4 MG disintegrating tablet DISSOLVE ONE TABLET BY MOUTH EVERY 8 HOURS AS NEEDED FOR NAUSEA AND VOMITING 20 tablet 0   ONETOUCH ULTRA test strip Test BS 4 times daily Dx E11.69 400 each 3   Semaglutide (RYBELSUS) 14 MG TABS Take 1 tablet (14 mg total) by mouth daily. 90 tablet 1   Simethicone (GAS-X PO) Take 1 capsule by mouth daily as needed.     temazepam (RESTORIL) 15 MG capsule TAKE ONE CAPSULE BY MOUTH AT BEDTIME AS NEEDED 30 capsule 1   No current facility-administered medications for this visit.     Past Surgical History:  Procedure Laterality Date   bladder cancer - operation - 01/21/18 - Dr Tresa Toenjes  01/21/2018   CARPAL TUNNEL RELEASE     bilateral    CHOLECYSTECTOMY  11/2016   COLONOSCOPY N/A 09/22/2018   Procedure: COLONOSCOPY;  Surgeon: Rogene Houston, MD;  Location: [facility];  Service: Endoscopy;  Laterality: N/A;  1:00   CORONARY ANGIOPLASTY WITH STENT PLACEMENT      to rt coronary atery (Dr. Degert-cardiologist)   [place], URETEROSCOPY AND STENT PLACEMENT Left 03/07/2018   Procedure: CYSTOSCOPY WITH RETROGRADE PYELOGRAM,  AND STENT PLACEMENT;  Surgeon: [name] Frock, MD;  Location: [facility];  Service: Urology;  Laterality: Left;   ERCP N/A 11/24/2016   Procedure: ENDOSCOPIC RETROGRADE CHOLANGIOPANCREATOGRAPHY (  ERCP);  Surgeon: Rogene Houston, MD;  Location: [facility];  Service: Endoscopy;  Laterality: N/A;   EXTRACORPOREAL SHOCK WAVE LITHOTRIPSY Left 03/17/2018   Procedure: LEFT EXTRACORPOREAL SHOCK WAVE LITHOTRIPSY (ESWL);  Surgeon: Irine Seal, MD;  Location: [facility];  Service: Urology;  Laterality: Left;   REMOVAL OF STONES N/A 11/24/2016   Procedure: REMOVAL OF STONES;  Surgeon: Rogene Houston, MD;  [facility]: [facility];  Service: Endoscopy;  Laterality: N/A;   SPHINCTEROTOMY N/A 11/24/2016   Procedure: SPHINCTEROTOMY;  Surgeon: Rogene Houston, MD;  [facility]: [facility];  Service: Endoscopy;  Laterality: N/A;   TISSUE AORTIC VALVE REPLACEMENT     2012     Allergies  Allergen Reactions   Glyxambi [Empagliflozin-Linagliptin] Diarrhea and Itching    He was able to take Jardiance alone with no side effects so it was the Tradjenta part that most likely gave him the reaction   Hctz [Hydrochlorothiazide]     Nausea and headache    Sulfa Antibiotics Nausea And Vomiting   Sulfonamide Derivatives       Family History  Problem Relation Age of Onset   Hearing loss Mother    Alzheimer's disease Mother    Hearing loss Father    Diabetes Father    Diabetes Other        Family History      Social History Mr. Poitras reports that he quit smoking about 19 years ago. His smoking use included cigarettes. He started smoking about 47 years ago. He has a 37.50 pack-year smoking history. He has never used smokeless tobacco. Mr. Kleinsasser reports no history of alcohol use.   Review of Systems CONSTITUTIONAL: No weight  loss, fever, chills, weakness or fatigue.  HEENT: Eyes: No visual loss, blurred vision, double vision or yellow sclerae.No hearing loss, sneezing, congestion, runny nose or sore throat.  SKIN: No rash or itching.  CARDIOVASCULAR: per hpi RESPIRATORY: per hpi GASTROINTESTINAL: No anorexia, nausea, vomiting or diarrhea. No abdominal pain or blood.  GENITOURINARY: No burning on urination, no polyuria NEUROLOGICAL: No headache, dizziness, syncope, paralysis, ataxia, numbness or tingling in the extremities. No change in bowel or bladder control.  MUSCULOSKELETAL: No muscle, back pain, joint pain or stiffness.  LYMPHATICS: No enlarged nodes. No history of splenectomy.  PSYCHIATRIC: No history of depression or anxiety.  ENDOCRINOLOGIC: No reports of sweating, cold or heat intolerance. No polyuria or polydipsia.  [name]   Physical Examination Today's Vitals   02/16/22 0832  BP: 138/70  Pulse: (!) 56  SpO2: 96%  Weight: 188 lb 12.8 oz (85.6 kg)  Height: $Remove'5\' 10"'HVEmddv$  (1.778 m)   Body mass index is 27.09 kg/m.  Gen: resting comfortably, no acute distress HEENT: no scleral icterus, pupils equal round and reactive, no palptable cervical adenopathy,  CV: RRR, no m/r/g no jvd Resp: Clear to auscultation bilaterally GI: abdomen is soft, non-tender, non-distended, normal bowel sounds, no hepatosplenomegaly MSK: extremities are warm, no edema.  Skin: warm, no rash Neuro:  no focal deficits Psych: appropriate affect   Diagnostic Studies  09/2012 MRI/MRA Chest Stable 4 cm ascending thoracic aneurysm   08/2010 Cath HEMODYNAMIC DATA: Right atrial pressures 7/5 with a mean of 4 mmHg,   right ventricular pressure is 24 with an EDP of 7 mmHg. Pulmonary   artery pressure is 20/7 with a mean of 13 mmHg. Pulmonary capillary   wedge pressure is 10/7 with a mean of 6 mmHg. Aortic pressure is 118/67   with a mean of  88 mmHg. Left ventricular pressure is 161 with an EDP of   12 mmHg. There is no significant  mitral valve gradient. The aortic   valve mean gradient is 31 mmHg with calculated valve area of 0.8   centimeter squared. Thermodilution cardiac output is 4.4 L per minute   with an index of 2, Fick cardiac outputs 4.7 L per minute with an index   of 2.2.   ANGIOGRAPHIC DATA: The left ventricular angiography was performed in a   RAO view. This demonstrates normal left ventricular size and   contractility with normal systolic function. Ejection fraction is   estimated at 65%. The aortic valve is calcified with eccentric opening   and restrictive mobility.   The left coronary artery arises and distributes normally. The left main   coronary has 20% narrowing in the ostium and in the distal left main.   The left anterior descending artery is a tortuous vessel which has less   than 10% irregularities.   Left circumflex coronary artery is rise to first obtuse marginal vessel   which has a 40-50% stenosis proximally.   The right coronary artery is a dominant vessel, it has 20% disease in   the distal vessel. The stent in the midvessel was widely patent.   FINAL INTERPRETATION:   1. Nonobstructive atherosclerotic coronary artery disease.   2. Normal left ventricular function.   3. Severe aortic stenosis.   4. Normal right heart pressures.   PLAN: Proceed with aortic valve replacement.   10/2013 MRA Chest EXAM: MRA CHEST WITH OR WITHOUT CONTRAST   TECHNIQUE: Angiographic images of the chest were obtained using MRA technique without and with intravenous contrast.   CONTRAST:  21mL MULTIHANCE GADOBENATE DIMEGLUMINE 529 MG/ML IV SOLN   COMPARISON:  CT UROGRAM dated 05/08/2011; MR MRA CHEST W/ OR W/O CM dated 10/07/2012; CT ANGIO CHEST dated 09/08/2010   FINDINGS: There is stable aneurysmal dilatation of the aortic root that does not involve the sinuses of Valsalva. No aortic dissection is present. Artifact is noted from a prosthetic aortic valve. Diameter at the level of the sinuses of  Valsalva is approximately 3.2 cm. Maximal caliber of the ascending thoracic aorta is 4.1- 4.2 cm. The proximal arch measures 3.8 cm. The distal arch measures 2.6 cm. The descending thoracic aorta measures 2.6 cm. Proximal great vessels show stable and normal patency without anatomical variant.   The heart size is normal. No pleural or pericardial fluid is identified. No masses or lymphadenopathy are seen. Visualized spine shows no abnormalities.   IMPRESSION: Stable aneurysmal dilatation of the ascending thoracic aorta measuring 4.1- 4.2 cm in greatest diameter. No dissection is identified.   10/2014 CTA Chest IMPRESSION: 1. Mild fusiform aneurysmal dilatation of the ascending thoracic aorta measuring approximately 41 mm in maximal diameter, stable since the 2012 examination. 2. Stable sequela of prior median sternotomy and aortic valve replacement. 3. Coronary artery calcifications. 4. Punctate (approximately 5 mm) in determine right lower lobe pulmonary nodule, not definitely seen on the 20/2012 examination. If the patient is at high risk for bronchogenic carcinoma, follow-up chest CT at 6-12 months is recommended. If the patient is at low risk for bronchogenic carcinoma, follow-up chest CT at 12 months is recommended. This recommendation follows the consensus statement:   11/2015 CTA chest   IMPRESSION: Mild aneurysmal dilatation of the ascending aorta with a maximal diameter of 4.0 cm. This is not significantly changed.     11/2016 echo Study Conclusions   -  Left ventricle: The cavity size was normal. Wall thickness was   normal. Systolic function was normal. The estimated ejection   fraction was in the range of 60% to 65%. Wall motion was normal;   there were no regional wall motion abnormalities. Left   ventricular diastolic function parameters were normal. - Aortic valve: There isa 25-mm Edwards pericardial Magna-Ease   valve model #[id number] in the AV position. Mildly  calcified   annulus. Trileaflet; normal thickness leaflets. There is a mild   gradient across the prosthetic valve. Mean gradient (S): 12 mm   Hg. Mean gradient of 18 mmHg with pedhoff probe. Valve area   (VTI): 1.71 cm^2. - Technically adequate study.   11/2016 CTA chest IMPRESSION: 1. Stable mild fusiform aneurysm of the ascending aorta, measuring maximum diameter of 4.0 cm. Recommend annual imaging followup by CTA or MRA. This recommendation follows 2010 ACCF/AHA/AATS/ACR/ASA/SCA/SCAI/SIR/STS/SVM Guidelines for the Diagnosis and Management of Patients with Thoracic Aortic Disease. Circulation. 2010; 121: Y801-K553 2. Long-term stability of pulmonary nodules, consistent with benign process.     01/2019 Chest CTA IMPRESSION: 1. Stable appearance of ascending thoracic aorta. Maximal measurement is 3.7 cm. This was overestimated on axial imaging previously. Recommend annual imaging followup by CTA or MRA. This recommendation follows 2010 ACCF/AHA/AATS/ACR/ASA/SCA/SCAI/SIR/STS/SVM Guidelines for the Diagnosis and Management of Patients with Thoracic Aortic Disease. Circulation.2010; 121: Z482-L078. Aortic aneurysm NOS (ICD10-I71.9) 2. Stable peripheral pleural based subcentimeter pulmonary nodules, likely inflammatory. No follow-up necessary. 3.  Aortic Atherosclerosis (ICD10-I70.0).   01/2020 CTA IMPRESSION: 1. Ascending aorta is at the upper limits of normal in size, stable. 2. Hepatic steatosis. Mild marginal irregularity the liver is indicative of cirrhosis. 3. Aortic atherosclerosis (ICD10-I70.0). Coronary artery calcification. 4.  Emphysema (ICD10-J43.9).       Jan 2023 echo   IMPRESSIONS     1. Left ventricular ejection fraction, by estimation, is 60 to 65%. The  left ventricle has normal function. The left ventricle has no regional  wall motion abnormalities. There is mild left ventricular hypertrophy.  Left ventricular diastolic parameters  were normal.   2.  Right ventricular systolic function is normal. The right ventricular  size is normal. Tricuspid regurgitation signal is inadequate for assessing  PA pressure.   3. The mitral valve is degenerative. Trivial mitral valve regurgitation.  No evidence of mitral stenosis. The mean mitral valve gradient is 2.0  mmHg.   4. The aortic valve has been repaired/replaced. Aortic valve  regurgitation is not visualized. There is a 25 mm [facility] Ease  pericardial valve present in the aortic position. Aortic valve mean  gradient measures 6.5 mmHg. Grossly normal prosthetic  function.   5. The inferior vena cava is normal in size with greater than 50%  respiratory variability, suggesting right atrial pressure of 3 mmHg.    Assessment and Plan   1. Aortic valve replacement - hx of bicuspid AV, s/p bioprosthetic AVR in 2012 - Jan 2023 echo normal AVR - walking limited due to hip but rides stationary bike daily without exertional symptoms, continue to monitor  2. Chest pain - atypical symptoms, monitor at this time.        3. HTN - homes bp's at goal, continue current meds     4. Afib - recent diagnosis Jan 2023 during admission. Had COVID infection around that time, unclear if afib solely secondary to COVID - plan for 30 day monitor to see if any recurrent afib, if not could consider coming off eliquis.   5.  Hyperlipidemia - at goal, continue atrovastatin    F/u 44months   Arnoldo Lenis, M.D.

## 2022-02-19 ENCOUNTER — Ambulatory Visit (INDEPENDENT_AMBULATORY_CARE_PROVIDER_SITE_OTHER): Payer: HMO

## 2022-02-19 DIAGNOSIS — I4891 Unspecified atrial fibrillation: Secondary | ICD-10-CM | POA: Diagnosis not present

## 2022-02-20 ENCOUNTER — Telehealth: Payer: Self-pay

## 2022-02-20 NOTE — Chronic Care Management (AMB) (Signed)
Chronic Care Management   Note  02/20/2022 Name: [name] [name] MRN: [id number] DOB: 08-27-1955  [name] [name] is a 66 y.o. year old male who is a primary care patient of Dettinger, Fransisca Kaufmann, MD. I reached out to Vernell Barrier by phone today in response to a referral sent by [name] [name]'s PCP.  [name] [name] was given information about Chronic Care Management services today including:  CCM service includes personalized support from designated clinical staff supervised by his physician, including individualized plan of care and coordination with other care providers 24/7 contact phone numbers for assistance for urgent and routine care needs. Service will only be billed when office clinical staff spend 20 minutes or more in a month to coordinate care. Only one practitioner may furnish and bill the service in a calendar month. The patient may stop CCM services at any time (effective at the end of the month) by phone call to the office staff. The patient is responsible for co-pay (up to 20% after annual deductible is met) if co-pay is required by the individual health plan.   Patient agreed to services and verbal consent obtained.   Follow up plan: Telephone appointment with care management team member scheduled for:04/03/2022  Noreene Larsson, [place], [place] [address] Direct Dial: [phone] [email or web address]

## 2022-02-25 ENCOUNTER — Telehealth: Payer: Self-pay

## 2022-02-25 NOTE — Telephone Encounter (Signed)
Received patient assistance medication- tresiba  Left patient detailed message Medication put in fridge

## 2022-03-05 ENCOUNTER — Telehealth: Payer: Self-pay | Admitting: Pharmacist

## 2022-03-05 NOTE — Telephone Encounter (Signed)
Please let patient know:  I received patient's financials only We need entire household financials  And total out of pocket spend at the pharmacy (print out for 2023) If he has spent 3% out of pocket, he will qualify This usually takes the patient spending [phone] out of pocket for year then they can get eliquis free

## 2022-03-05 NOTE — Telephone Encounter (Signed)
Patient returning call. Please call back

## 2022-03-17 NOTE — Telephone Encounter (Signed)
Pt has been informed of what [name] [name] needs to complete financial assistance. He will bring his wife info and give to the girls up front. Will route message to [name] [name] to make aware.

## 2022-03-26 NOTE — Telephone Encounter (Signed)
I have received household financials If patient is trying to get eliquis--he needs to bring out of pocket spend and must meet the 3% (the nurse told him, but they didn't bring in)  [place], can you reach out to patient? I have not sent application  Thank you!

## 2022-03-27 NOTE — Telephone Encounter (Signed)
Spoke with [name] [name]. Informed her that we need patients out of pocket expenses for the year. Pt's wife communicated info to [name].

## 2022-03-31 ENCOUNTER — Encounter: Payer: Self-pay | Admitting: *Deleted

## 2022-04-03 ENCOUNTER — Ambulatory Visit: Payer: HMO | Admitting: Pharmacist

## 2022-04-03 DIAGNOSIS — E1169 Type 2 diabetes mellitus with other specified complication: Secondary | ICD-10-CM

## 2022-04-03 DIAGNOSIS — I482 Chronic atrial fibrillation, unspecified: Secondary | ICD-10-CM

## 2022-04-03 NOTE — Progress Notes (Signed)
Chronic Care Management Pharmacy Note  04/03/2022 Name:  [name] [name] MRN:  [id number] DOB:  Apr 23, 1956  Summary:  Atrial Fibrillation: - new DX Jan 2023  - asymptomatic -Eliquis for anticoagulation-denies signs/symptoms of bleeding; diltiazem for rate control (dizziness on 120mg , PCP decreased dose, remains on atenolol) -Difficulty affording eliquis--will submit application for BMS patient assistance     Subjective: [name] [name] is an 66 y.o. year old male who is a primary patient of Dettinger, 70, MD.  The CCM team was consulted for assistance with disease management and care coordination needs.    Engaged with patient by telephone for follow up visit in response to provider referral for pharmacy case management and/or care coordination services.   Consent to Services:  The patient was given information about Chronic Care Management services, agreed to services, and gave verbal consent prior to initiation of services.  Please see initial visit note for detailed documentation.   Patient Care Team: Dettinger, [name], MD as PCP - General (Family Medicine) [name], MD as PCP - Cardiology (Cardiology) Antoine Poche, [facility] (Pharmacist)  Objective:  Lab Results  Component Value Date   CREATININE 1.60 (H) 02/05/2022   CREATININE 1.59 (H) 07/24/2021   CREATININE 1.44 (H) 07/17/2021   CREATININE 1.42 (H) 07/17/2021    Lab Results  Component Value Date   HGBA1C 7.0 (H) 02/05/2022   Last diabetic Eye exam:  Lab Results  Component Value Date/Time   HMDIABEYEEXA No Retinopathy 09/02/2021 12:00 AM    Last diabetic Foot exam: No results found for: "HMDIABFOOTEX"      Component Value Date/Time   CHOL 94 (L) 02/05/2022 0940   CHOL 76 12/05/2012 0931   TRIG 97 02/05/2022 0940   TRIG 180 (H) 01/19/2017 1131   TRIG 43 12/05/2012 0931   HDL 29 (L) 02/05/2022 0940   HDL 28 (L) 01/19/2017 1131   HDL 36 (L) 12/05/2012 0931   CHOLHDL 3.2 02/05/2022 0940    LDLCALC 46 02/05/2022 0940   LDLCALC 50 02/28/2014 1116   LDLCALC 31 12/05/2012 0931       Latest Ref Rng & Units 02/05/2022    9:40 AM 07/24/2021    3:48 PM 07/17/2021    5:02 AM  Hepatic Function  Total Protein 6.0 - 8.5 g/dL 6.9  7.0    Albumin 3.9 - 4.9 g/dL 4.8  4.1  2.7    2.7   AST 0 - 40 IU/L 18  22    ALT 0 - 44 IU/L 22  29    Alk Phosphatase 44 - 121 IU/L 77  77    Total Bilirubin 0.0 - 1.2 mg/dL 0.7  0.3      Lab Results  Component Value Date/Time   TSH 1.740 03/26/2015 09:24 AM       Latest Ref Rng & Units 02/05/2022    9:40 AM 07/24/2021    3:48 PM 07/16/2021    5:38 AM  CBC  WBC 3.4 - 10.8 x10E3/uL 6.6  6.2  6.9   Hemoglobin 13.0 - 17.7 g/dL 09/13/2021  42.3  66.1   Hematocrit 37.5 - 51.0 % 37.6  34.0  33.3   Platelets 150 - 450 x10E3/uL 182  209  139     Lab Results  Component Value Date/Time   VD25OH 48.5 12/21/2018 08:18 AM   VD25OH 31.8 08/02/2018 04:50 PM    Clinical ASCVD: No  The ASCVD Risk score (Arnett DK, et al., 2019)  failed to calculate for the following reasons:   The valid total cholesterol range is 130 to 320 mg/dL    Other: (CHADS2VASc if Afib, PHQ9 if depression, MMRC or CAT for COPD, ACT, DEXA)  Social History   Tobacco Use  Smoking Status Former   Packs/day: 1.50   Years: 25.00   Total pack years: 37.50   Types: Cigarettes   Start date: 07/06/1974   Quit date: 07/06/2002   Years since quitting: 19.7  Smokeless Tobacco Never   BP Readings from Last 3 Encounters:  02/16/22 138/70  02/05/22 136/77  10/23/21 (!) 147/72   Pulse Readings from Last 3 Encounters:  02/16/22 (!) 56  02/05/22 66  10/23/21 69   Wt Readings from Last 3 Encounters:  02/16/22 188 lb 12.8 oz (85.6 kg)  02/05/22 188 lb (85.3 kg)  12/02/21 193 lb (87.5 kg)    Assessment: Review of patient past medical history, allergies, medications, health status, including review of consultants reports, laboratory and other test data, was performed as part of  comprehensive evaluation and provision of chronic care management services.   SDOH:  (Social Determinants of Health) assessments and interventions performed:  SDOH Interventions    Flowsheet Row Office Visit from 02/05/2022 in [place] from 12/02/2021 in [place] Visit from 07/24/2021 in [place] Interventions     Food Insecurity Interventions -- Intervention Not Indicated --  Housing Interventions -- Intervention Not Indicated --  Transportation Interventions -- Intervention Not Indicated --  Depression Interventions/Treatment  PHQ2-9 Score <4 Follow-up Not Indicated -- Counseling  Financial Strain Interventions -- Intervention Not Indicated --  Physical Activity Interventions -- Intervention Not Indicated --  Stress Interventions -- Intervention Not Indicated --  Social Connections Interventions -- Intervention Not Indicated --       CCM Care Plan  Allergies  Allergen Reactions   Glyxambi [Empagliflozin-Linagliptin] Diarrhea and Itching    He was able to take Jardiance alone with no side effects so it was the Tradjenta part that most likely gave him the reaction   Hctz [Hydrochlorothiazide]     Nausea and headache    Sulfa Antibiotics Nausea And Vomiting   Sulfonamide Derivatives     Medications Reviewed Today     Reviewed by Lavera Guise, [facility] (Pharmacist) on 04/03/22 at [place] List Status: <None>   Medication Order Taking? Sig Documenting Provider Last Dose Status Informant  acetaminophen (TYLENOL) 325 MG tablet [id number] No Take 2 tablets (650 mg total) by mouth every 6 (six) hours as needed for mild pain (or Fever >/= 101). Roxan Hockey, MD Taking Active   apixaban (ELIQUIS) 5 MG TABS tablet [id number] No Take 1 tablet (5 mg total) by mouth 2 (two) times daily. Dettinger, Fransisca Kaufmann, MD Taking Active   atenolol (TENORMIN) 25 MG tablet [id number] No Take 1 tablet (25  mg total) by mouth daily. Roxan Hockey, MD Taking Active   atorvastatin (LIPITOR) 40 MG tablet [id number] No Take 1 tablet (40 mg total) by mouth daily. Dettinger, Fransisca Kaufmann, MD Taking Active Spouse/Significant Other, Self  blood glucose meter kit and supplies KIT [id number] No Dispense based on patient and insurance preference. Use up to four times daily as directed. (FOR ICD-9 250.00, 250.01). Dettinger, Fransisca Kaufmann, MD Taking Active Spouse/Significant Other, Self  cholecalciferol (VITAMIN D) 1000 UNITS tablet [id number] No Take 2,000 Units by mouth 2 (two) times daily. [provider] Taking Active Spouse/Significant Other, Self  Med Note Rosalyn Gess   Fri Jul 04, 2021  3:45 PM)    diltiazem (CARDIZEM) 30 MG tablet [id number] No Take 1 tablet (30 mg total) by mouth 2 (two) times daily. Arnoldo Lenis, MD Taking Active   fluticasone [facility]) 50 MCG/ACT nasal spray [id number] No Place 2 sprays into the nose daily as needed for allergies. Chipper Herb, MD Taking Active Spouse/Significant Other, Self  glipiZIDE (GLUCOTROL) 5 MG tablet [id number] No Take 2 tablets (10 mg total) by mouth 2 (two) times daily before a meal. Dettinger, Fransisca Kaufmann, MD Taking Active Spouse/Significant Other, Self  insulin degludec (TRESIBA FLEXTOUCH) 100 UNIT/ML FlexTouch Pen [id number] No Inject 10 Units into the skin daily. Roxan Hockey, MD Taking Active   Insulin Pen Needle (GLOBAL EASE INJECT PEN NEEDLES) 31G X 5 MM MISC [id number] No Use daily as directed Dx E11.69 Dettinger, Fransisca Kaufmann, MD Taking Active   Lancets (ONETOUCH DELICA PLUS [id number]) [place] [id number] No 4 (four) times daily. [provider] Taking Active Spouse/Significant Other, Self  metFORMIN (GLUCOPHAGE) 1000 MG tablet [id number] No Take 1 tablet (1,000 mg total) by mouth 2 (two) times daily with a meal. Denton Brick, [place], MD Taking Active   ondansetron (ZOFRAN-ODT) 4 MG disintegrating tablet [id number] No DISSOLVE ONE TABLET BY  MOUTH EVERY 8 HOURS AS NEEDED FOR NAUSEA AND VOMITING Janora Norlander, DO Taking Active   [facility] ULTRA test strip [id number] No Test BS 4 times daily Dx E11.69 Dettinger, Fransisca Kaufmann, MD Taking Active   Semaglutide (RYBELSUS) 14 MG TABS [id number] No Take 1 tablet (14 mg total) by mouth daily. Dettinger, Fransisca Kaufmann, MD Taking Active   Simethicone (GAS-X PO) [id number] No Take 1 capsule by mouth daily as needed. [provider] Taking Active Spouse/Significant Other, Self           Med Note Rosalyn Gess   Fri Jul 04, 2021  3:41 PM)    temazepam (RESTORIL) 15 MG capsule [id number] No TAKE ONE CAPSULE BY MOUTH AT BEDTIME AS NEEDED Dettinger, Fransisca Kaufmann, MD Taking Active             Patient Active Problem List   Diagnosis Date Noted   Sensorineural hearing loss (SNHL), bilateral 02/08/2020   Tinnitus of both ears 02/08/2020   Irritable bowel syndrome 03/09/2019   Common bile duct stone 11/24/2016   Pulmonary nodules 11/18/2016   Type 2 diabetes mellitus with hyperlipidemia ([place]) 12/04/2015   GERD (gastroesophageal reflux disease) 03/08/2014   NAFLD (nonalcoholic fatty liver disease) 03/08/2014   Ascending aortic aneurysm ([place]) 12/12/2012   INSOMNIA 09/15/2010   Atrial fibrillation, chronic ([place]) 09/15/2010   CAD (coronary artery disease) 07/09/2009   Aortic valve disorder 05/27/2009   Hyperlipidemia with target LDL less than 70 05/24/2009   Essential hypertension 05/24/2009   Unspecified sleep apnea 05/24/2009    Immunization History  Administered Date(s) Administered   Fluad Quad(high Dose 65+) 05/07/2021   Influenza,inj,Quad PF,6+ Mos 05/17/2013, 05/03/2014, 04/25/2015, 04/10/2016, 04/23/2017, 05/05/2018, 04/20/2019, 04/26/2020   Pneumococcal Conjugate-13 11/24/2013   Pneumococcal Polysaccharide-23 04/20/2019   Td 11/11/2009   Tdap 11/11/2009, 01/25/2020   Unspecified SARS-COV-2 Vaccination 09/13/2019, 10/09/2019, 06/24/2020   Zoster Recombinat (Shingrix) 10/23/2021,  02/05/2022   Zoster, Live 03/03/2011    Conditions to be addressed/monitored: Atrial Fibrillation and DMII  Care Plan : PHARMD MEDICATION MANAGEMENT  Updates made by Lavera Guise, [place] since 04/03/2022 12:00 AM     Problem: DISEASE PROGRESSION PREVENTION      Long-Range Goal: T2DM  Recent Progress: On track  Priority: High  Note:   Current Barriers:  Unable to independently afford treatment regimen Unable to achieve control of T2DM   Pharmacist Clinical Goal(s):  Over the next 90 days, patient will verbalize ability to afford treatment regimen achieve control of T2DM as evidenced by GOAL A1C<7%  through collaboration with PharmD and provider.   Interventions: 1:1 collaboration with Dettinger, Fransisca Kaufmann, MD regarding development and update of comprehensive plan of care as evidenced by provider attestation and co-signature Inter-disciplinary care team collaboration (see longitudinal plan of care) Comprehensive medication review performed; medication list updated in electronic medical record  Diabetes: Controlled (A1C 7.9-->6.6%, GFR 53-->48); current treatment: RYBELSUS 14MG , TRESIBA 10 UNITS, glipizide, metformin Denies personal and family history of Medullary thyroid cancer (MTC) DISCONTINUED FARGIXA CONTINUE RYBELSUS, TRESIBA, METFORMIN, GLIPIZIDE  Would like to transition paitnet to ozempic (from rybelsus) and minimize use of glipizide or insulin Insulin requirements have decreased Received notification from [place] regarding RE-ENROLLMENT approval for [place] 14MG . Patient assistance approved from 07/06/21 to 07/05/22. Phone: [phone] Current glucose readings: fasting glucose: <150, post prandial glucose: N/A Denies hypoglycemic/hyperglycemic symptoms Discussed meal planning options and Plate method for healthy eating Avoid sugary drinks and desserts Incorporate balanced protein, non starchy veggies, 1 serving of carbohydrate with each  meal Increase water intake Increase physical activity as able Current exercise: ENCOURAGED  Educated on RYBELSUS /GLP1; LDL at goal<70, continue current meds Assessed patient finances. Received notification from [place] regarding RE-ENROLLMENT approval for [place] 14MG . Patient assistance approved from 07/06/21 to 07/05/22.     Atrial Fibrillation: Diagnosed in April of 2023 On eliquis for anticoagulation; diltiazem for rate control, remains on atenolol Difficulty affording eliquis--will submit application for BMS patient assistance  Patient Goals/Self-Care Activities Over the next 90 days, patient will:  - take medications as prescribed check glucose DAILY (FASTING), document, and provide at future appointments  Follow Up Plan: Telephone follow up appointment with care management team member scheduled for: 3 MONTHs      Medication Assistance: Application for eliquis  medication assistance program. in process.  Anticipated assistance start date tbd.  See plan of care for additional detail.  Regina Eck, PharmD, BCPS Clinical Pharmacist, [place]  [place] Phone [phone]

## 2022-04-09 NOTE — Telephone Encounter (Signed)
Received notification from [place] ([place]) regarding patient assistance DENIAL for [employer].   PT INCOME IS OVER LIMIT, OOP EXPENSES BASED ON HOUSEHOLD INCOME NOT MET.  Phone: [phone]

## 2022-04-10 ENCOUNTER — Telehealth: Payer: Self-pay

## 2022-04-10 NOTE — Telephone Encounter (Signed)
Informed pt of denial of Eliquis. Suggested he reach out to his pcp or Almyra Free for next steps.

## 2022-04-10 NOTE — Telephone Encounter (Signed)
Pt is aware that he does not qualify for financial assistance. He does not want to switch to a different medication. States that he will continue paying for the Eliquis.

## 2022-04-13 NOTE — Telephone Encounter (Signed)
Okay keep a track on how much he is pain because there is 1 called Xarelto that is $85 per month no matter what, if Eliquis ends up being more expensive in  a year than that then we can switch to that 1 which is very equivalent.

## 2022-04-13 NOTE — Telephone Encounter (Signed)
Pt aware.

## 2022-05-07 ENCOUNTER — Telehealth: Payer: Self-pay

## 2022-05-07 NOTE — Telephone Encounter (Signed)
Mailed Novo Nordisk renewal application to patient home.   Malayiah Mcbrayer L. CPhT Rx Patient Advocate  

## 2022-05-08 ENCOUNTER — Ambulatory Visit: Payer: PPO | Admitting: Family Medicine

## 2022-05-13 ENCOUNTER — Encounter: Payer: Self-pay | Admitting: Family Medicine

## 2022-05-13 ENCOUNTER — Ambulatory Visit (INDEPENDENT_AMBULATORY_CARE_PROVIDER_SITE_OTHER): Payer: HMO | Admitting: Family Medicine

## 2022-05-13 VITALS — BP 142/69 | HR 62 | Temp 98.5°F | Ht 70.0 in | Wt 190.0 lb

## 2022-05-13 DIAGNOSIS — E785 Hyperlipidemia, unspecified: Secondary | ICD-10-CM | POA: Diagnosis not present

## 2022-05-13 DIAGNOSIS — Z23 Encounter for immunization: Secondary | ICD-10-CM | POA: Diagnosis not present

## 2022-05-13 DIAGNOSIS — I482 Chronic atrial fibrillation, unspecified: Secondary | ICD-10-CM

## 2022-05-13 DIAGNOSIS — I10 Essential (primary) hypertension: Secondary | ICD-10-CM

## 2022-05-13 DIAGNOSIS — E1169 Type 2 diabetes mellitus with other specified complication: Secondary | ICD-10-CM | POA: Diagnosis not present

## 2022-05-13 DIAGNOSIS — I2510 Atherosclerotic heart disease of native coronary artery without angina pectoris: Secondary | ICD-10-CM

## 2022-05-13 LAB — BAYER DCA HB A1C WAIVED: HB A1C (BAYER DCA - WAIVED): 7 % — ABNORMAL HIGH (ref 4.8–5.6)

## 2022-05-13 MED ORDER — GLIPIZIDE 5 MG PO TABS: 10.0000 mg | ORAL_TABLET | Freq: Two times a day (BID) | ORAL | 3 refills | Status: DC

## 2022-05-13 MED ORDER — ATORVASTATIN CALCIUM 40 MG PO TABS: 40.0000 mg | ORAL_TABLET | Freq: Every day | ORAL | 3 refills | Status: DC

## 2022-05-13 NOTE — Progress Notes (Signed)
BP (!) 142/69   Pulse 62   Temp 98.5 F (36.9 C)   Ht _0  (1.778 m)   Wt 190 lb (86.2 kg)   SpO2 97%   BMI 27.26 kg/m    Subjective:   Patient ID: [name] [name], male    DOB: July 27, 1955, 66 y.o.   MRN: [id number]  HPI: [name] [name] is a 66 y.o. male presenting on 05/13/2022 for Medical Management of Chronic Issues   HPI Hypertension and A-fib Patient is currently on atenolol and diltiazem, and their blood pressure today is 142/69. Patient denies any lightheadedness or dizziness. Patient denies headaches, blurred vision, chest pains, shortness of breath, or weakness. Denies any side effects from medication and is content with current medication.   Hyperlipidemia and CAD Patient is coming in for recheck of his hyperlipidemia. The patient is currently taking atorvastatin. They deny any issues with myalgias or history of liver damage from it. They deny any focal numbness or weakness or chest pain.   Type 2 diabetes mellitus Patient comes in today for recheck of his diabetes. Patient has been currently taking Rybelsus and Tresiba and metformin and glipizide. Patient is not currently on an ACE inhibitor/ARB. Patient has seen an ophthalmologist this year. Patient denies any issues with their feet. The symptom started onset as an adult hypertension and hyperlipidemia and CAD and A-fib ARE RELATED TO DM   Relevant past medical, surgical, family and social history reviewed and updated as indicated. Interim medical history since our last visit reviewed. Allergies and medications reviewed and updated.  Review of Systems  Constitutional:  Negative for chills and fever.  Eyes:  Negative for visual disturbance.  Respiratory:  Negative for shortness of breath and wheezing.   Cardiovascular:  Negative for chest pain and leg swelling.  Musculoskeletal:  Negative for back pain and gait problem.  Skin:  Negative for rash.  Neurological:  Negative for dizziness, weakness and light-headedness.   All other systems reviewed and are negative.   Per HPI unless specifically indicated above   Allergies as of 05/13/2022       Reactions   Glyxambi [empagliflozin-linagliptin] Diarrhea, Itching   He was able to take Jardiance alone with no side effects so it was the Tradjenta part that most likely gave him the reaction   Hctz [hydrochlorothiazide]    Nausea and headache    Sulfa Antibiotics Nausea And Vomiting   Sulfonamide Derivatives         Medication List        Accurate as of May 13, 2022 11:17 AM. If you have any questions, ask your nurse or doctor.          acetaminophen 325 MG tablet Commonly known as: TYLENOL Take 2 tablets (650 mg total) by mouth every 6 (six) hours as needed for mild pain (or Fever >/= 101).   apixaban 5 MG Tabs tablet Commonly known as: ELIQUIS Take 1 tablet (5 mg total) by mouth 2 (two) times daily.   atenolol 25 MG tablet Commonly known as: TENORMIN Take 1 tablet (25 mg total) by mouth daily.   atorvastatin 40 MG tablet Commonly known as: LIPITOR Take 1 tablet (40 mg total) by mouth daily.   blood glucose meter kit and supplies Kit Dispense based on patient and insurance preference. Use up to four times daily as directed. (FOR ICD-9 250.00, 250.01).   cholecalciferol 1000 units tablet Commonly known as: VITAMIN D Take 2,000 Units by mouth 2 (two) times daily.  diltiazem 30 MG tablet Commonly known as: Cardizem Take 1 tablet (30 mg total) by mouth 2 (two) times daily.   fluticasone 50 MCG/ACT nasal spray Commonly known as: FLONASE Place 2 sprays into the nose daily as needed for allergies.   GAS-X PO Take 1 capsule by mouth daily as needed.   glipiZIDE 5 MG tablet Commonly known as: GLUCOTROL Take 2 tablets (10 mg total) by mouth 2 (two) times daily before a meal.   Global Ease Inject Pen Needles 31G X 5 MM Misc Generic drug: Insulin Pen Needle Use daily as directed Dx E11.69   metFORMIN 1000 MG tablet Commonly  known as: GLUCOPHAGE Take 1 tablet (1,000 mg total) by mouth 2 (two) times daily with a meal.   ondansetron 4 MG disintegrating tablet Commonly known as: ZOFRAN-ODT DISSOLVE ONE TABLET BY MOUTH EVERY 8 HOURS AS NEEDED FOR NAUSEA AND VOMITING   OneTouch Delica Plus [id number] Misc 4 (four) times daily.   OneTouch Ultra test strip Generic drug: glucose blood Test BS 4 times daily Dx E11.69   penicillin v potassium 500 MG tablet Commonly known as: VEETID Take 500 mg by mouth 3 (three) times daily.   Rybelsus 14 MG Tabs Generic drug: Semaglutide Take 1 tablet (14 mg total) by mouth daily.   temazepam 15 MG capsule Commonly known as: RESTORIL TAKE ONE CAPSULE BY MOUTH AT BEDTIME AS NEEDED   Tresiba FlexTouch 100 UNIT/ML FlexTouch Pen Generic drug: insulin degludec Inject 10 Units into the skin daily.         Objective:   BP (!) 142/69   Pulse 62   Temp 98.5 F (36.9 C)   Ht _0  (1.778 m)   Wt 190 lb (86.2 kg)   SpO2 97%   BMI 27.26 kg/m   Wt Readings from Last 3 Encounters:  05/13/22 190 lb (86.2 kg)  02/16/22 188 lb 12.8 oz (85.6 kg)  02/05/22 188 lb (85.3 kg)    Physical Exam Vitals and nursing note reviewed.  Constitutional:      General: He is not in acute distress.    Appearance: He is well-developed. He is not diaphoretic.  Eyes:     General: No scleral icterus.    Conjunctiva/sclera: Conjunctivae normal.  Neck:     Thyroid: No thyromegaly.  Cardiovascular:     Rate and Rhythm: Normal rate and regular rhythm.     Pulses: Normal pulses.     Heart sounds: Normal heart sounds. No murmur heard. Pulmonary:     Effort: Pulmonary effort is normal. No respiratory distress.     Breath sounds: Normal breath sounds. No wheezing.  Musculoskeletal:        General: Normal range of motion.     Cervical back: Neck supple.  Lymphadenopathy:     Cervical: No cervical adenopathy.  Skin:    General: Skin is warm and dry.     Findings: No rash.  Neurological:      Mental Status: He is alert and oriented to person, place, and time.     Coordination: Coordination normal.  Psychiatric:        Behavior: Behavior normal.       Assessment & Plan:   Problem List Items Addressed This Visit       Cardiovascular and Mediastinum   Essential hypertension   Relevant Medications   atorvastatin (LIPITOR) 40 MG tablet   Atrial fibrillation, chronic (HCC)   Relevant Medications   atorvastatin (LIPITOR) 40 MG tablet   CAD (  coronary artery disease)   Relevant Medications   atorvastatin (LIPITOR) 40 MG tablet     Endocrine   Type 2 diabetes mellitus with hyperlipidemia (HCC)   Relevant Medications   atorvastatin (LIPITOR) 40 MG tablet   glipiZIDE (GLUCOTROL) 5 MG tablet   Other Relevant Orders   Bayer DCA Hb A1c Waived   CMP14+EGFR   CBC with Differential/Platelet     Other   Hyperlipidemia with target LDL less than 70 - Primary   Relevant Medications   atorvastatin (LIPITOR) 40 MG tablet   Other Relevant Orders   Lipid panel   Other Visit Diagnoses     Need for immunization against influenza       Relevant Orders   Flu Vaccine QUAD High Dose(Fluad) (Completed)       No change in medication, seems to be doing well, A1c is stable at 7.0. Follow up plan: Return in about 3 months (around 08/13/2022), or if symptoms worsen or fail to improve, for Diabetes and hypertension and cholesterol.  Counseling provided for all of the vaccine components Orders Placed This Encounter  Procedures   Flu Vaccine QUAD High Dose(Fluad)   Bayer DCA Hb A1c Waived   CMP14+EGFR   CBC with Differential/Platelet   Lipid panel    Caryl Pina, MD [facility] 05/13/2022, 11:17 AM

## 2022-05-14 LAB — CMP14+EGFR
ALT: 24 IU/L (ref 0–44)
AST: 22 IU/L (ref 0–40)
Albumin/Globulin Ratio: 1.9 (ref 1.2–2.2)
Albumin: 4.6 g/dL (ref 3.9–4.9)
Alkaline Phosphatase: 75 IU/L (ref 44–121)
BUN/Creatinine Ratio: 15 (ref 10–24)
BUN: 24 mg/dL (ref 8–27)
Bilirubin Total: 0.4 mg/dL (ref 0.0–1.2)
CO2: 17 mmol/L — ABNORMAL LOW (ref 20–29)
Calcium: 9.2 mg/dL (ref 8.6–10.2)
Chloride: 106 mmol/L (ref 96–106)
Creatinine, Ser: 1.55 mg/dL — ABNORMAL HIGH (ref 0.76–1.27)
Globulin, Total: 2.4 g/dL (ref 1.5–4.5)
Glucose: 142 mg/dL — ABNORMAL HIGH (ref 70–99)
Potassium: 4.5 mmol/L (ref 3.5–5.2)
Sodium: 140 mmol/L (ref 134–144)
Total Protein: 7 g/dL (ref 6.0–8.5)
eGFR: 49 mL/min/{1.73_m2} — ABNORMAL LOW (ref >59)

## 2022-05-14 LAB — CBC WITH DIFFERENTIAL/PLATELET
Basophils Absolute: 0 10*3/uL (ref 0.0–0.2)
Basos: 1 %
EOS (ABSOLUTE): 0.2 10*3/uL (ref 0.0–0.4)
Eos: 3 %
Hematocrit: 37.1 % — ABNORMAL LOW (ref 37.5–51.0)
Hemoglobin: 12.6 g/dL — ABNORMAL LOW (ref 13.0–17.7)
Immature Grans (Abs): 0 10*3/uL (ref 0.0–0.1)
Immature Granulocytes: 0 %
Lymphocytes Absolute: 1.4 10*3/uL (ref 0.7–3.1)
Lymphs: 22 %
MCH: 32.8 pg (ref 26.6–33.0)
MCHC: 34 g/dL (ref 31.5–35.7)
MCV: 97 fL (ref 79–97)
Monocytes Absolute: 0.5 10*3/uL (ref 0.1–0.9)
Monocytes: 8 %
Neutrophils Absolute: 4.3 10*3/uL (ref 1.4–7.0)
Neutrophils: 66 %
Platelets: 195 10*3/uL (ref 150–450)
RBC: 3.84 x10E6/uL — ABNORMAL LOW (ref 4.14–5.80)
RDW: 12.7 % (ref 11.6–15.4)
WBC: 6.5 10*3/uL (ref 3.4–10.8)

## 2022-05-21 ENCOUNTER — Emergency Department (HOSPITAL_COMMUNITY): Payer: HMO

## 2022-05-21 ENCOUNTER — Other Ambulatory Visit: Payer: Self-pay

## 2022-05-21 ENCOUNTER — Encounter (HOSPITAL_COMMUNITY): Payer: Self-pay

## 2022-05-21 ENCOUNTER — Observation Stay (HOSPITAL_COMMUNITY)
Admission: EM | Admit: 2022-05-21 | Discharge: 2022-05-22 | Disposition: A | Discharge status: Home / Self Care | Payer: HMO | Attending: Family Medicine | Admitting: Family Medicine

## 2022-05-21 DIAGNOSIS — R42 Dizziness and giddiness: Principal | ICD-10-CM

## 2022-05-21 DIAGNOSIS — E1165 Type 2 diabetes mellitus with hyperglycemia: Secondary | ICD-10-CM

## 2022-05-21 DIAGNOSIS — N1831 Chronic kidney disease, stage 3a: Secondary | ICD-10-CM | POA: Insufficient documentation

## 2022-05-21 DIAGNOSIS — Z7984 Long term (current) use of oral hypoglycemic drugs: Secondary | ICD-10-CM | POA: Diagnosis not present

## 2022-05-21 DIAGNOSIS — G4733 Obstructive sleep apnea (adult) (pediatric): Secondary | ICD-10-CM

## 2022-05-21 DIAGNOSIS — Z955 Presence of coronary angioplasty implant and graft: Secondary | ICD-10-CM | POA: Diagnosis not present

## 2022-05-21 DIAGNOSIS — N179 Acute kidney failure, unspecified: Secondary | ICD-10-CM | POA: Insufficient documentation

## 2022-05-21 DIAGNOSIS — I63233 Cerebral infarction due to unspecified occlusion or stenosis of bilateral carotid arteries: Secondary | ICD-10-CM | POA: Diagnosis not present

## 2022-05-21 DIAGNOSIS — I480 Paroxysmal atrial fibrillation: Secondary | ICD-10-CM | POA: Insufficient documentation

## 2022-05-21 DIAGNOSIS — R4781 Slurred speech: Secondary | ICD-10-CM | POA: Insufficient documentation

## 2022-05-21 DIAGNOSIS — I2510 Atherosclerotic heart disease of native coronary artery without angina pectoris: Secondary | ICD-10-CM | POA: Insufficient documentation

## 2022-05-21 DIAGNOSIS — Z7901 Long term (current) use of anticoagulants: Secondary | ICD-10-CM | POA: Diagnosis not present

## 2022-05-21 DIAGNOSIS — I129 Hypertensive chronic kidney disease with stage 1 through stage 4 chronic kidney disease, or unspecified chronic kidney disease: Secondary | ICD-10-CM | POA: Diagnosis not present

## 2022-05-21 DIAGNOSIS — Z87891 Personal history of nicotine dependence: Secondary | ICD-10-CM | POA: Insufficient documentation

## 2022-05-21 DIAGNOSIS — E1122 Type 2 diabetes mellitus with diabetic chronic kidney disease: Secondary | ICD-10-CM | POA: Diagnosis not present

## 2022-05-21 DIAGNOSIS — N182 Chronic kidney disease, stage 2 (mild): Secondary | ICD-10-CM | POA: Diagnosis present

## 2022-05-21 DIAGNOSIS — I10 Essential (primary) hypertension: Secondary | ICD-10-CM | POA: Diagnosis present

## 2022-05-21 DIAGNOSIS — Z794 Long term (current) use of insulin: Secondary | ICD-10-CM | POA: Insufficient documentation

## 2022-05-21 DIAGNOSIS — Z79899 Other long term (current) drug therapy: Secondary | ICD-10-CM | POA: Diagnosis not present

## 2022-05-21 DIAGNOSIS — I639 Cerebral infarction, unspecified: Secondary | ICD-10-CM | POA: Insufficient documentation

## 2022-05-21 DIAGNOSIS — E119 Type 2 diabetes mellitus without complications: Secondary | ICD-10-CM

## 2022-05-21 DIAGNOSIS — E782 Mixed hyperlipidemia: Secondary | ICD-10-CM

## 2022-05-21 DIAGNOSIS — I482 Chronic atrial fibrillation, unspecified: Secondary | ICD-10-CM | POA: Diagnosis present

## 2022-05-21 DIAGNOSIS — I6523 Occlusion and stenosis of bilateral carotid arteries: Secondary | ICD-10-CM

## 2022-05-21 DIAGNOSIS — N189 Chronic kidney disease, unspecified: Secondary | ICD-10-CM | POA: Diagnosis present

## 2022-05-21 LAB — DIFFERENTIAL
Abs Immature Granulocytes: 0.02 10*3/uL (ref 0.00–0.07)
Basophils Absolute: 0 10*3/uL (ref 0.0–0.1)
Basophils Relative: 1 %
Eosinophils Absolute: 0.2 10*3/uL (ref 0.0–0.5)
Eosinophils Relative: 3 %
Immature Granulocytes: 0 %
Lymphocytes Relative: 22 %
Lymphs Abs: 1.3 10*3/uL (ref 0.7–4.0)
Monocytes Absolute: 0.5 10*3/uL (ref 0.1–1.0)
Monocytes Relative: 8 %
Neutro Abs: 4.1 10*3/uL (ref 1.7–7.7)
Neutrophils Relative %: 66 %

## 2022-05-21 LAB — COMPREHENSIVE METABOLIC PANEL
ALT: 24 U/L (ref 0–44)
AST: 20 U/L (ref 15–41)
Albumin: 3.8 g/dL (ref 3.5–5.0)
Alkaline Phosphatase: 65 U/L (ref 38–126)
Anion gap: 8 (ref 5–15)
BUN: 33 mg/dL — ABNORMAL HIGH (ref 8–23)
CO2: 23 mmol/L (ref 22–32)
Calcium: 8.9 mg/dL (ref 8.9–10.3)
Chloride: 107 mmol/L (ref 98–111)
Creatinine, Ser: 1.85 mg/dL — ABNORMAL HIGH (ref 0.61–1.24)
GFR, Estimated: 40 mL/min — ABNORMAL LOW (ref >60)
Glucose, Bld: 227 mg/dL — ABNORMAL HIGH (ref 70–99)
Potassium: 4.5 mmol/L (ref 3.5–5.1)
Sodium: 138 mmol/L (ref 135–145)
Total Bilirubin: 0.4 mg/dL (ref 0.3–1.2)
Total Protein: 6.6 g/dL (ref 6.5–8.1)

## 2022-05-21 LAB — ETHANOL: Alcohol, Ethyl (B): <10 mg/dL (ref <10)

## 2022-05-21 LAB — CBC
HCT: 33.3 % — ABNORMAL LOW (ref 39.0–52.0)
Hemoglobin: 11.1 g/dL — ABNORMAL LOW (ref 13.0–17.0)
MCH: 32.9 pg (ref 26.0–34.0)
MCHC: 33.3 g/dL (ref 30.0–36.0)
MCV: 98.8 fL (ref 80.0–100.0)
Platelets: 143 10*3/uL — ABNORMAL LOW (ref 150–400)
RBC: 3.37 MIL/uL — ABNORMAL LOW (ref 4.22–5.81)
RDW: 12.9 % (ref 11.5–15.5)
WBC: 6.1 10*3/uL (ref 4.0–10.5)
nRBC: 0 % (ref 0.0–0.2)

## 2022-05-21 LAB — APTT: aPTT: 28 seconds (ref 24–36)

## 2022-05-21 LAB — CBG MONITORING, ED: Glucose-Capillary: 216 mg/dL — ABNORMAL HIGH (ref 70–99)

## 2022-05-21 LAB — PROTIME-INR
INR: 1.1 (ref 0.8–1.2)
Prothrombin Time: 13.6 seconds (ref 11.4–15.2)

## 2022-05-21 MED ORDER — IOHEXOL 350 MG/ML SOLN
100.0000 mL | Freq: Once | INTRAVENOUS | Status: AC | PRN
  Administered 2022-05-21: 100 mL via INTRAVENOUS

## 2022-05-21 MED ORDER — SODIUM CHLORIDE 0.9 % IV SOLN
100.0000 mL/h | INTRAVENOUS | Status: DC
  Administered 2022-05-21 – 2022-05-22 (×2): 100 mL/h via INTRAVENOUS

## 2022-05-21 MED ORDER — SODIUM CHLORIDE 0.9 % IV BOLUS
500.0000 mL | Freq: Once | INTRAVENOUS | Status: AC
  Administered 2022-05-21: 500 mL via INTRAVENOUS

## 2022-05-21 NOTE — H&P (Signed)
History and Physical    Patient: [name] [name] SNK:[id number] DOB: [birth year] DOA: 05/21/2022 DOS: the patient was seen and examined on 05/22/2022 PCP: Dettinger, Fransisca Kaufmann, MD  Patient coming from: Home  Chief Complaint:  Chief Complaint  Patient presents with   Dizziness   Altered Mental Status   HPI: [name] is a 66 y.o. male with medical history significant of hypertension, atrial fibrillation on Eliquis, type 2 diabetes mellitus, OSA on CPAP, CAD who presents to the emergency department due to sudden onset of dizziness and difficulty with talking which occurred when he woke up at 7:30 PM this evening.  Last well-known was 6:30 PM prior to going to take a nap.  Per wife at bedside, patient was having difficulty in getting medication out of her pillbox and was having problem in being able to tie lace of shoes.  At bedside, patient states that his speech has not returned to normal, though, there has been an improvement since onset of symptoms.  ED Course:  In the emergency department, she was hemodynamically stable.  Work-up in the ED showed normocytic anemia, BMP was normal except for hyperglycemia, BUN/creatinine 33/1.85 (baseline creatinine at 1.4-1.6).  Alcohol level was less than 10. CT angiography of the neck showed no emergent LVO or high-grade stenosis of the intracranial arteries. Bilateral proximal internal carotid artery atherosclerosis resulting in 70% stenosis on the right and less than 50% stenosis on the left. CT head without contrast showed no acute intracranial abnormality IV hydration was provided.  Teleneurology was consulted and recommended further stroke work-up.  Hospitalist was asked to admit patient for further evaluation and management.   Review of Systems: Review of systems as noted in the HPI. All other systems reviewed and are negative.   Past Medical History:  Diagnosis Date   Aortic stenosis 8/09   Bicuspid aortic valve   Arthritis 6/10   hand     Atrial fibrillation (HCC)    post op only   Bile duct calculus with acute cholecystitis    CAD (coronary artery disease) 8/09   Calculus of common bile duct with acute pancreatitis 11/2016   CTS (carpal tunnel syndrome)    bilateral    Diabetes ([place])    Diastolic dysfunction 9/37   Elevated LFTs    Fatty liver disease, nonalcoholic    History of kidney stones    Hyperlipidemia    Hypertension    Kidney stones    Nasal congestion    NIDDM (non-insulin dependent diabetes mellitus)    x15 yrs   OSA on CPAP 2006   Paresthesias in left hand 02/19/2004   Paresthesias in right hand 02/19/2004   Sinusitis    Vitamin D deficiency 06/13/09   Past Surgical History:  Procedure Laterality Date   bladder cancer - operation - 01/21/18 - Dr Tresa Strnad  01/21/2018   CARPAL TUNNEL RELEASE     bilateral    CHOLECYSTECTOMY  11/2016   COLONOSCOPY N/A 09/22/2018   Procedure: COLONOSCOPY;  Surgeon: Rogene Houston, MD;  Location: [facility];  Service: Endoscopy;  Laterality: N/A;  1:00   CORONARY ANGIOPLASTY WITH STENT PLACEMENT     to rt coronary atery (Dr. Degert-cardiologist)   [place], URETEROSCOPY AND STENT PLACEMENT Left 03/07/2018   Procedure: CYSTOSCOPY WITH RETROGRADE PYELOGRAM,  AND STENT PLACEMENT;  Surgeon: [name] Frock, MD;  Location: [facility];  Service: Urology;  Laterality: Left;   ERCP N/A 11/24/2016   Procedure: ENDOSCOPIC RETROGRADE CHOLANGIOPANCREATOGRAPHY (ERCP);  Surgeon: Rogene Houston, MD;  Location: [facility];  Service: Endoscopy;  Laterality: N/A;   EXTRACORPOREAL SHOCK WAVE LITHOTRIPSY Left 03/17/2018   Procedure: LEFT EXTRACORPOREAL SHOCK WAVE LITHOTRIPSY (ESWL);  Surgeon: Irine Seal, MD;  Location: [facility];  Service: Urology;  Laterality: Left;   REMOVAL OF STONES N/A 11/24/2016   Procedure: REMOVAL OF STONES;  Surgeon: Rogene Houston, MD;  [facility]: [facility];  Service: Endoscopy;  Laterality: N/A;   SPHINCTEROTOMY N/A 11/24/2016    Procedure: SPHINCTEROTOMY;  Surgeon: Rogene Houston, MD;  Location: [facility];  Service: Endoscopy;  Laterality: N/A;   TISSUE AORTIC VALVE REPLACEMENT     2012    Social History:  reports that he quit smoking about 19 years ago. His smoking use included cigarettes. He started smoking about 47 years ago. He has a 37.50 pack-year smoking history. He has never used smokeless tobacco. He reports that he does not drink alcohol and does not use drugs.   Allergies  Allergen Reactions   Glyxambi [Empagliflozin-Linagliptin] Diarrhea and Itching    He was able to take Jardiance alone with no side effects so it was the Tradjenta part that most likely gave him the reaction   Hctz [Hydrochlorothiazide]     Nausea and headache    Sulfa Antibiotics Nausea And Vomiting   Sulfonamide Derivatives     Family History  Problem Relation Age of Onset   Hearing loss Mother    Alzheimer's disease Mother    Hearing loss Father    Diabetes Father    Diabetes Other        Family History      Prior to Admission medications   Medication Sig Start Date End Date Taking? Authorizing Provider  acetaminophen (TYLENOL) 325 MG tablet Take 2 tablets (650 mg total) by mouth every 6 (six) hours as needed for mild pain (or Fever >/= 101). 07/17/21   Roxan Hockey, MD  apixaban (ELIQUIS) 5 MG TABS tablet Take 1 tablet (5 mg total) by mouth 2 (two) times daily. 02/05/22   Dettinger, Fransisca Kaufmann, MD  atenolol (TENORMIN) 25 MG tablet Take 1 tablet (25 mg total) by mouth daily. 07/17/21   Roxan Hockey, MD  atorvastatin (LIPITOR) 40 MG tablet Take 1 tablet (40 mg total) by mouth daily. 05/13/22   Dettinger, Fransisca Kaufmann, MD  blood glucose meter kit and supplies KIT Dispense based on patient and insurance preference. Use up to four times daily as directed. (FOR ICD-9 250.00, 250.01). 07/29/20   Dettinger, Fransisca Kaufmann, MD  cholecalciferol (VITAMIN D) 1000 UNITS tablet Take 2,000 Units by mouth 2 (two) times daily.    [provider]  diltiazem (CARDIZEM) 30 MG tablet Take 1 tablet (30 mg total) by mouth 2 (two) times daily. 10/17/21   Arnoldo Lenis, MD  fluticasone (FLONASE) 50 MCG/ACT nasal spray Place 2 sprays into the nose daily as needed for allergies. 12/12/12   Chipper Herb, MD  glipiZIDE (GLUCOTROL) 5 MG tablet Take 2 tablets (10 mg total) by mouth 2 (two) times daily before a meal. 05/13/22   Dettinger, Fransisca Kaufmann, MD  insulin degludec (TRESIBA FLEXTOUCH) 100 UNIT/ML FlexTouch Pen Inject 10 Units into the skin daily. 07/17/21   Roxan Hockey, MD  Insulin Pen Needle (GLOBAL EASE INJECT PEN NEEDLES) 31G X 5 MM MISC Use daily as directed Dx E11.69 02/05/22   Dettinger, Fransisca Kaufmann, MD  Lancets [facility] DELICA PLUS [id number]) MISC 4 (four) times daily. 07/29/20  [provider]  metFORMIN (GLUCOPHAGE) 1000 MG tablet Take 1 tablet (1,000 mg total) by mouth 2 (two) times daily with a meal. 07/17/21   Emokpae, Courage, MD  ondansetron (ZOFRAN-ODT) 4 MG disintegrating tablet DISSOLVE ONE TABLET BY MOUTH EVERY 8 HOURS AS NEEDED FOR NAUSEA AND VOMITING 08/22/21   Ronnie Doss M, DO  ONETOUCH ULTRA test strip Test BS 4 times daily Dx E11.69 02/05/22   Dettinger, Fransisca Kaufmann, MD  penicillin v potassium (VEETID) 500 MG tablet Take 500 mg by mouth 3 (three) times daily. 02/09/22   [provider]  Semaglutide (RYBELSUS) 14 MG TABS Take 1 tablet (14 mg total) by mouth daily. 02/05/22   Dettinger, Fransisca Kaufmann, MD  Simethicone (GAS-X PO) Take 1 capsule by mouth daily as needed.    [provider]  temazepam (RESTORIL) 15 MG capsule TAKE ONE CAPSULE BY MOUTH AT BEDTIME AS NEEDED 02/05/22   Dettinger, Fransisca Kaufmann, MD    Physical Exam: BP 133/63   Pulse (!) 57   Temp 98.2 F (36.8 C) (Oral)   Resp 16   Ht _0  (1.778 m)   Wt 86.2 kg   SpO2 96%   BMI 27.26 kg/m   General: 66 y.o. year-old male well developed well nourished in no acute distress.  Alert and oriented x3. HEENT: NCAT, EOMI Neck:  Supple, trachea medial Cardiovascular: Regular rate and rhythm with no rubs or gallops.  No thyromegaly or JVD noted.  No lower extremity edema. 2/4 pulses in all 4 extremities. Respiratory: Clear to auscultation with no wheezes or rales. Good inspiratory effort. Abdomen: Soft, nontender nondistended with normal bowel sounds x4 quadrants. Muskuloskeletal: No cyanosis, clubbing or edema noted bilaterally Neuro: CN II-XII intact, strength 5/5 x 4, sensation, reflexes intact, patient presents with minimal slurred speech Skin: No ulcerative lesions noted or rashes Psychiatry: Judgement and insight appear normal. Mood is appropriate for condition and setting          Labs on Admission:  Basic Metabolic Panel: Recent Labs  Lab 05/21/22 2211  NA 138  K 4.5  CL 107  CO2 23  GLUCOSE 227*  BUN 33*  CREATININE 1.85*  CALCIUM 8.9   Liver Function Tests: Recent Labs  Lab 05/21/22 2211  AST 20  ALT 24  ALKPHOS 65  BILITOT 0.4  PROT 6.6  ALBUMIN 3.8   No results for input(s): "LIPASE", "AMYLASE" in the last 168 hours. No results for input(s): "AMMONIA" in the last 168 hours. CBC: Recent Labs  Lab 05/21/22 2211  WBC 6.1  NEUTROABS 4.1  HGB 11.1*  HCT 33.3*  MCV 98.8  PLT 143*   Cardiac Enzymes: No results for input(s): "CKTOTAL", "CKMB", "CKMBINDEX", "TROPONINI" in the last 168 hours.  BNP (last 3 results) No results for input(s): "BNP" in the last 8760 hours.  ProBNP (last 3 results) No results for input(s): "PROBNP" in the last 8760 hours.  CBG: Recent Labs  Lab 05/21/22 2202  GLUCAP 216*    Radiological Exams on Admission: CT ANGIO HEAD NECK W WO CM W PERF (CODE STROKE)  Result Date: 05/21/2022 CLINICAL DATA:  Vertigo and slurred speech EXAM: CT ANGIOGRAPHY HEAD AND NECK CT PERFUSION BRAIN TECHNIQUE: Multidetector CT imaging of the head and neck was performed using the standard protocol during bolus administration of intravenous contrast. Multiplanar CT image  reconstructions and MIPs were obtained to evaluate the vascular anatomy. Carotid stenosis measurements (when applicable) are obtained utilizing NASCET criteria, using the distal internal carotid diameter as  the denominator. Multiphase CT imaging of the brain was performed following IV bolus contrast injection. Subsequent parametric perfusion maps were calculated using RAPID software. RADIATION DOSE REDUCTION: This exam was performed according to the departmental dose-optimization program which includes automated exposure control, adjustment of the mA and/or kV according to patient size and/or use of iterative reconstruction technique. CONTRAST:  175m OMNIPAQUE IOHEXOL 350 MG/ML SOLN COMPARISON:  None Available. FINDINGS: CTA NECK FINDINGS SKELETON: There is no bony spinal canal stenosis. No lytic or blastic lesion. OTHER NECK: Normal pharynx, larynx and major salivary glands. No cervical lymphadenopathy. Unremarkable thyroid gland. UPPER CHEST: No pneumothorax or pleural effusion. No nodules or masses. AORTIC ARCH: There is calcific atherosclerosis of the aortic arch. There is no aneurysm, dissection or hemodynamically significant stenosis of the visualized portion of the aorta. Conventional 3 vessel aortic branching pattern. The visualized proximal subclavian arteries are widely patent. RIGHT CAROTID SYSTEM: No dissection, occlusion or aneurysm. There is predominantly calcified atherosclerosis extending into the proximal ICA, resulting in 70% stenosis. LEFT CAROTID SYSTEM: Calcific atherosclerosis at the carotid bifurcation extending into the proximal ICA causing less than 50% stenosis. VERTEBRAL ARTERIES: Left dominant configuration. Both origins are clearly patent. There is no dissection, occlusion or flow-limiting stenosis to the skull base (V1-V3 segments). CTA HEAD FINDINGS POSTERIOR CIRCULATION: --Vertebral arteries: Normal V4 segments. --Inferior cerebellar arteries: Normal. --Basilar artery: Normal.  --Superior cerebellar arteries: Normal. --Posterior cerebral arteries (PCA): Normal. ANTERIOR CIRCULATION: --Intracranial internal carotid arteries: Normal. --Anterior cerebral arteries (ACA): Normal. Both A1 segments are present. Patent anterior communicating artery (a-comm). --Middle cerebral arteries (MCA): Normal. VENOUS SINUSES: As permitted by contrast timing, patent. ANATOMIC VARIANTS: None Review of the MIP images confirms the above findings. CT Brain Perfusion Findings: ASPECTS: 10 CBF (<30%) Volume: 053mPerfusion (Tmax>6.0s) volume: 26m58mismatch Volume: 26mL77mfarction Location:None IMPRESSION: 1. No emergent large vessel occlusion or high-grade stenosis of the intracranial arteries. 2. Bilateral proximal internal carotid artery atherosclerosis resulting in 70% stenosis on the right and less than 50% stenosis on the left. 3. Normal CT perfusion. Electronically Signed   By: KeviUlyses Jarred.   On: 05/21/2022 23:10   CT HEAD CODE STROKE WO CONTRAST  Result Date: 05/21/2022 CLINICAL DATA:  Code stroke.  Altered mental status EXAM: CT HEAD WITHOUT CONTRAST TECHNIQUE: Contiguous axial images were obtained from the base of the skull through the vertex without intravenous contrast. RADIATION DOSE REDUCTION: This exam was performed according to the departmental dose-optimization program which includes automated exposure control, adjustment of the mA and/or kV according to patient size and/or use of iterative reconstruction technique. COMPARISON:  None Available. FINDINGS: Brain: There is no mass, hemorrhage or extra-axial collection. The size and configuration of the ventricles and extra-axial CSF spaces are normal. The brain parenchyma is normal, without evidence of acute or chronic infarction. Vascular: No abnormal hyperdensity of the major intracranial arteries or dural venous sinuses. No intracranial atherosclerosis. Skull: The visualized skull base, calvarium and extracranial soft tissues are normal.  Sinuses/Orbits: No fluid levels or advanced mucosal thickening of the visualized paranasal sinuses. No mastoid or middle ear effusion. The orbits are normal. ASPECTS ([facility] Program Early CT Score) - Ganglionic level infarction (caudate, lentiform nuclei, internal capsule, insula, M1-M3 cortex): 7 - Supraganglionic infarction (M4-M6 cortex): 3 Total score (0-10 with 10 being normal): 10 IMPRESSION: 1. No acute intracranial abnormality. 2. Aspects is 10. These results were called by telephone at the time of interpretation on 05/21/2022 at 10:18 pm to provider JULIE HAVILAND , who  verbally acknowledged these results. Electronically Signed   By: Ulyses Jarred M.D.   On: 05/21/2022 22:18    EKG: I independently viewed the EKG done and my findings are as followed:    Assessment/Plan Present on Admission:  Acute kidney injury superimposed on chronic kidney disease ([place])  Essential hypertension  Atrial fibrillation, chronic ([place])  Active Problems:   Essential hypertension   OSA on CPAP   Atrial fibrillation, chronic ([place])   Uncontrolled type 2 diabetes mellitus with hyperglycemia, with long-term current use of insulin (HCC)   Acute kidney injury superimposed on chronic kidney disease (HCC)   Dizziness   Slurred speech   Dizziness, slurred speech rule out acute ischemic stroke Patient will be admitted to telemetry unit  CT angiography of the neck showed no emergent LVO or high-grade stenosis of the intracranial arteries Echocardiogram in the morning MRI of brain without contrast in the morning Continue Eliquis, Lipitor Continue fall precautions and neuro checks Lipid panel and hemoglobin A1c will be checked Continue PT/SLP/OT eval and treat Bedside swallow eval by nursing prior to diet Tele neurology will be consulted and we shall await further recommendations   T2DM with uncontrolled hyperglycemia Continue Semglee 10 units nightly and adjust dose accordingly Continue ISS and  hypoglycemia protocol  Acute kidney injury on CKD 3A BUN/creatinine 33/1.85 (baseline creatinine at 1.4-1.6).  Renally adjust medications, avoid nephrotoxic agents/dehydration/hypotension  Essential hypertension  Antihypertensives PRN if Blood pressure is greater than 220/120 or there is a concern for End organ damage/contraindications for permissive HTN. If blood pressure is greater than 220/120 give labetalol PO or IV or Vasotec IV with a goal of 15% reduction in BP during the first 24 hours.  Atrial fibrillation Continue Eliquis Temporarily hold beta-blocker and CCB for permissive hypertension  OSA on CPAP Continue CPAP  Hyperlipidemia Continue Lipitor   DVT prophylaxis: Liquids  Code Status: Full code  Consults: Neurology  Family Communication: Wife at bedside (all questions answered to satisfaction)  Severity of Illness: The appropriate patient status for this patient is OBSERVATION. Observation status is judged to be reasonable and necessary in order to provide the required intensity of service to ensure the patient's safety. The patient's presenting symptoms, physical exam findings, and initial radiographic and laboratory data in the context of their medical condition is felt to place them at decreased risk for further clinical deterioration. Furthermore, it is anticipated that the patient will be medically stable for discharge from the hospital within 2 midnights of admission.   Author: Bernadette Hoit, DO 05/22/2022 4:34 AM  For on call review [email or web address].

## 2022-05-21 NOTE — Consult Note (Signed)
Neurology Addendum:  CTA head/neck showed no LVO .There was some moderate R ICA stenosis, but of unclear clinical signficance given symtpoms. NO change in recommendations for MRI brain and continue Eliquis.   Jacky Kindle, M.D.  Neurology

## 2022-05-21 NOTE — Consult Note (Signed)
[facility] TeleNeurology Consult Services   Patient Name:   [name] [name], [name] [name] "Jeannie Done" Date of Birth:   Dec 02, 1955 Identification Number:   MRN - [id number] Date of Service:   05/21/2022 22:11:39  Diagnosis:       R42 - Dizziness/ Vertigo/ Giddiness       R47.81 - Slurred speech  Impression:      Pt is a 66 yo M with a-fib on Eliquis, DM, HTN, HLP who presented to the ED with vertigo and slurred speech. PT was not a candidate for iv-thrombolytics because he is on Eliquis. CTA head/neck showed no obvious LVO but final report is pending (will follow-up with results). Recommend admission for stroke work-up including MRI brain without contrast to rule out stroke, TTE, lipid panel, hemoglobin AIC and telemetry. Can continue Eliquis.  Our recommendations are outlined below.  Recommendations:        Stroke/Telemetry Floor       Neuro Checks       Bedside Swallow Eval       DVT Prophylaxis       IV Fluids, Normal Saline       Head of Bed 30 Degrees       Euglycemia and Avoid Hyperthermia (PRN Acetaminophen)       Continue Eliquis       MRI brain without contrast, TTE, lipid panel, hemoglobin AIC  Sign Out:       Discussed with Emergency Department Provider    ------------------------------------------------------------------------------  Advanced Imaging: CTA Head and Neck Completed.  CTP Completed.  LVO:No  Patient in not a candidate for NIR   Metrics: Last Known Well: 05/21/2022 18:30:00 [facility] Notification Time: 05/21/2022 22:11:39 Arrival Time: 05/21/2022 21:12:00 Stamp Time: 05/21/2022 22:11:39 Initial Response Time: 05/21/2022 22:15:09 Symptoms: Dizziness. Initial patient interaction: 05/21/2022 22:20:32 NIHSS Assessment Completed: 05/21/2022 22:27:00 Patient is not a candidate for Thrombolytic. Thrombolytic Medical Decision: 05/21/2022 22:29:05 Patient was not deemed candidate for Thrombolytic because of following reasons: Use of NOAs  within 48 hours.  I personally Reviewed the CT Head and it showed no acute ischemic changes, no ICH.  Primary Provider Notified of Diagnostic Impression and Management Plan on: 05/21/2022 22:38:02    ------------------------------------------------------------------------------  History of Present Illness: Patient is a 66 year old Male.  Patient was brought by private transportation with symptoms of Dizziness. Pt is a 66 yo M wtih a-fib on Eliquis, DM, HTN, HLP who presented to the ED with vertigo and slurred speech. PT went to take a nap in his USOH at 18:00. He woke-at up 19:30 with a spinning sensation. He had no headache, visual changes, nausea, numbness or weakness but his speech was slurred. Pt currently states his dizziness has improved so he is not clear if his dizziness was exacerbated by movement. He last took Eliquis today.    Past Medical History:      There is no history of Coronary Artery Disease      There is no history of Stroke  Medications:  Anticoagulant use:  Yes Eliquis 5 mg PO BID No Antiplatelet use Reviewed EMR for current medications  Allergies:  Reviewed  Social History: Smoking: Former Alcohol Use: No  Family History:  There is no family history of premature cerebrovascular disease pertinent to this consultation  ROS : 14 Points Review of Systems was performed and was negative except mentioned in HPI.  Past Surgical History: There Is No Surgical History Contributory To Today's Visit     Examination: BP(142/48), Pulse(63), 1A: Level of Consciousness -  Alert; keenly responsive + 0 1B: Ask Month and Age - Both Questions Right + 0 1C: Blink Eyes & Squeeze Hands - Performs Both Tasks + 0 2: Test Horizontal Extraocular Movements - Normal + 0 3: Test Visual Fields - No Visual Loss + 0 4: Test Facial Palsy (Use Grimace if Obtunded) - Normal symmetry + 0 5A: Test Left Arm Motor Drift - No Drift for 10 Seconds + 0 5B: Test Right Arm Motor Drift  - No Drift for 10 Seconds + 0 6A: Test Left Leg Motor Drift - No Drift for 5 Seconds + 0 6B: Test Right Leg Motor Drift - No Drift for 5 Seconds + 0 7: Test Limb Ataxia (FNF/Heel-Shin) - No Ataxia + 0 8: Test Sensation - Normal; No sensory loss + 0 9: Test Language/Aphasia - Normal; No aphasia + 0 10: Test Dysarthria - Normal + 0 11: Test Extinction/Inattention - No abnormality + 0  NIHSS Score: 0   Pre-Morbid Modified Rankin Scale: 0 Points = No symptoms at all  Spoke with : Jacalyn Lefevre, M.D.  Patient/Family was informed the Neurology Consult would occur via TeleHealth consult by way of interactive audio and video telecommunications and consented to receiving care in this manner.   Patient is being evaluated for possible acute neurologic impairment and high probability of imminent or life-threatening deterioration. I spent total of 34 minutes providing care to this patient, including time for face to face visit via telemedicine, review of medical records, imaging studies and discussion of findings with providers, the patient and/or family.   Dr Jacky Kindle   [facility] For Inpatient follow-up with [facility] physician please call [facility] [phone]. This is not an outpatient service. Post hospital discharge, please contact hospital directly.

## 2022-05-21 NOTE — ED Triage Notes (Addendum)
POV from home with wife. Cc of dizziness, seeing spots, slurred speech, and altered mental status that started around 730pm after he took a nap at 630pm . Pt showing expressive aphasia. Wife states that she heard him bump up against the wall and said he was dizzy. patient was also having some dexterity issues as well. Cbg at home 170

## 2022-05-21 NOTE — ED Notes (Signed)
CODE STROKE PAGED PER DR JULIE HAVILAND

## 2022-05-21 NOTE — ED Notes (Signed)
ED Provider at bedside. 

## 2022-05-21 NOTE — Progress Notes (Signed)
Telestroke RN note:  2203-Code stroke activated 2206-pt taken to CT 2211-TS MD paged 2215-pt back from CT 2218-Dr. Manson Passey joined tele-neuro cart for exam 2226-Dr. Manson Passey notified of negative head CT

## 2022-05-21 NOTE — ED Provider Notes (Signed)
[facility] EMERGENCY DEPARTMENT Provider Note   CSN: [id number] Arrival date & time: 05/21/22  2112  An emergency department physician performed an initial assessment on this suspected stroke patient at 2206.  History  Chief Complaint  Patient presents with   Dizziness   Altered Mental Status    ARBEN PACKMAN is a 66 y.o. male.  Pt is a 66 yo male with a pmhx significant for htn, afib (on Eliquis), DM, OSA on cpap, dm2, aortic stenosis, arthritis, and cad.  Pt's wife said he was normal at 1830.  He woke up around 1930 feeling dizzy with difficulty talking.  Pt was having trouble getting his medication out of the pill box and having trouble with fine motor movements.  Pt feels very sleepy.  No trauma.       Home Medications Prior to Admission medications   Medication Sig Start Date End Date Taking? Authorizing Provider  acetaminophen (TYLENOL) 325 MG tablet Take 2 tablets (650 mg total) by mouth every 6 (six) hours as needed for mild pain (or Fever >/= 101). 07/17/21   Roxan Hockey, MD  apixaban (ELIQUIS) 5 MG TABS tablet Take 1 tablet (5 mg total) by mouth 2 (two) times daily. 02/05/22   Dettinger, Fransisca Kaufmann, MD  atenolol (TENORMIN) 25 MG tablet Take 1 tablet (25 mg total) by mouth daily. 07/17/21   Roxan Hockey, MD  atorvastatin (LIPITOR) 40 MG tablet Take 1 tablet (40 mg total) by mouth daily. 05/13/22   Dettinger, Fransisca Kaufmann, MD  blood glucose meter kit and supplies KIT Dispense based on patient and insurance preference. Use up to four times daily as directed. (FOR ICD-9 250.00, 250.01). 07/29/20   Dettinger, Fransisca Kaufmann, MD  cholecalciferol (VITAMIN D) 1000 UNITS tablet Take 2,000 Units by mouth 2 (two) times daily.    [provider]  diltiazem (CARDIZEM) 30 MG tablet Take 1 tablet (30 mg total) by mouth 2 (two) times daily. 10/17/21   Arnoldo Lenis, MD  fluticasone (FLONASE) 50 MCG/ACT nasal spray Place 2 sprays into the nose daily as needed for allergies. 12/12/12    Chipper Herb, MD  glipiZIDE (GLUCOTROL) 5 MG tablet Take 2 tablets (10 mg total) by mouth 2 (two) times daily before a meal. 05/13/22   Dettinger, Fransisca Kaufmann, MD  insulin degludec (TRESIBA FLEXTOUCH) 100 UNIT/ML FlexTouch Pen Inject 10 Units into the skin daily. 07/17/21   Roxan Hockey, MD  Insulin Pen Needle (GLOBAL EASE INJECT PEN NEEDLES) 31G X 5 MM MISC Use daily as directed Dx E11.69 02/05/22   Dettinger, Fransisca Kaufmann, MD  Lancets [facility] DELICA PLUS [id number]) MISC 4 (four) times daily. 07/29/20   [provider]  metFORMIN (GLUCOPHAGE) 1000 MG tablet Take 1 tablet (1,000 mg total) by mouth 2 (two) times daily with a meal. 07/17/21   Emokpae, Courage, MD  ondansetron (ZOFRAN-ODT) 4 MG disintegrating tablet DISSOLVE ONE TABLET BY MOUTH EVERY 8 HOURS AS NEEDED FOR NAUSEA AND VOMITING 08/22/21   Ronnie Doss M, DO  ONETOUCH ULTRA test strip Test BS 4 times daily Dx E11.69 02/05/22   Dettinger, Fransisca Kaufmann, MD  penicillin v potassium (VEETID) 500 MG tablet Take 500 mg by mouth 3 (three) times daily. 02/09/22   [provider]  Semaglutide (RYBELSUS) 14 MG TABS Take 1 tablet (14 mg total) by mouth daily. 02/05/22   Dettinger, Fransisca Kaufmann, MD  Simethicone (GAS-X PO) Take 1 capsule by mouth daily as needed.    [provider]  temazepam (  RESTORIL) 15 MG capsule TAKE ONE CAPSULE BY MOUTH AT BEDTIME AS NEEDED 02/05/22   Dettinger, Fransisca Kaufmann, MD      Allergies    Glyxambi [empagliflozin-linagliptin], Hctz [hydrochlorothiazide], Sulfa antibiotics, and Sulfonamide derivatives    Review of Systems   Review of Systems  Neurological:  Positive for dizziness and weakness.  All other systems reviewed and are negative.   Physical Exam Updated Vital Signs BP (!) 161/68   Pulse 75   Temp 97.7 F (36.5 C) (Oral)   Resp 16   Ht _0  (1.778 m)   Wt 86.2 kg   SpO2 96%   BMI 27.26 kg/m  Physical Exam Vitals and nursing note reviewed.  Constitutional:      Appearance: Normal  appearance.  HENT:     Head: Normocephalic and atraumatic.     Right Ear: External ear normal.     Left Ear: External ear normal.     Nose: Nose normal.     Mouth/Throat:     Mouth: Mucous membranes are dry.  Eyes:     Extraocular Movements: Extraocular movements intact.     Conjunctiva/sclera: Conjunctivae normal.     Pupils: Pupils are equal, round, and reactive to light.  Cardiovascular:     Rate and Rhythm: Normal rate and regular rhythm.     Pulses: Normal pulses.     Heart sounds: Normal heart sounds.  Pulmonary:     Effort: Pulmonary effort is normal.     Breath sounds: Normal breath sounds.  Abdominal:     General: Abdomen is flat. Bowel sounds are normal.     Palpations: Abdomen is soft.  Musculoskeletal:        General: Normal range of motion.     Cervical back: Normal range of motion and neck supple.  Skin:    General: Skin is warm.     Capillary Refill: Capillary refill takes less than 2 seconds.  Neurological:     Mental Status: He is alert and oriented to person, place, and time.     Comments: Pt with some slurred speech.  Mild left facial droop.  Psychiatric:        Mood and Affect: Mood normal.        Behavior: Behavior normal.     ED Results / Procedures / Treatments   Labs (all labs ordered are listed, but only abnormal results are displayed) Labs Reviewed  CBC - Abnormal; Notable for the following components:      Result Value   RBC 3.37 (*)    Hemoglobin 11.1 (*)    HCT 33.3 (*)    Platelets 143 (*)    All other components within normal limits  COMPREHENSIVE METABOLIC PANEL - Abnormal; Notable for the following components:   Glucose, Bld 227 (*)    BUN 33 (*)    Creatinine, Ser 1.85 (*)    GFR, Estimated 40 (*)    All other components within normal limits  CBG MONITORING, ED - Abnormal; Notable for the following components:   Glucose-Capillary 216 (*)    All other components within normal limits  ETHANOL  PROTIME-INR  APTT  DIFFERENTIAL   RAPID URINE DRUG SCREEN, HOSP PERFORMED  URINALYSIS, ROUTINE W REFLEX MICROSCOPIC  I-STAT CHEM 8, ED    EKG EKG Interpretation  Date/Time:  Thursday May 21 2022 21:56:11 EST Ventricular Rate:  63 PR Interval:  168 QRS Duration: 85 QT Interval:  415 QTC Calculation: 425 R Axis:   -10 Text  Interpretation: Sinus rhythm Low voltage, precordial leads now in sinus Confirmed by Isla Pence [phone]) on 05/21/2022 10:39:14 PM  Radiology CT ANGIO HEAD NECK W WO CM W PERF (CODE STROKE)  Result Date: 05/21/2022 CLINICAL DATA:  Vertigo and slurred speech EXAM: CT ANGIOGRAPHY HEAD AND NECK CT PERFUSION BRAIN TECHNIQUE: Multidetector CT imaging of the head and neck was performed using the standard protocol during bolus administration of intravenous contrast. Multiplanar CT image reconstructions and MIPs were obtained to evaluate the vascular anatomy. Carotid stenosis measurements (when applicable) are obtained utilizing NASCET criteria, using the distal internal carotid diameter as the denominator. Multiphase CT imaging of the brain was performed following IV bolus contrast injection. Subsequent parametric perfusion maps were calculated using RAPID software. RADIATION DOSE REDUCTION: This exam was performed according to the departmental dose-optimization program which includes automated exposure control, adjustment of the mA and/or kV according to patient size and/or use of iterative reconstruction technique. CONTRAST:  1766m OMNIPAQUE IOHEXOL 350 MG/ML SOLN COMPARISON:  None Available. FINDINGS: CTA NECK FINDINGS SKELETON: There is no bony spinal canal stenosis. No lytic or blastic lesion. OTHER NECK: Normal pharynx, larynx and major salivary glands. No cervical lymphadenopathy. Unremarkable thyroid gland. UPPER CHEST: No pneumothorax or pleural effusion. No nodules or masses. AORTIC ARCH: There is calcific atherosclerosis of the aortic arch. There is no aneurysm, dissection or hemodynamically  significant stenosis of the visualized portion of the aorta. Conventional 3 vessel aortic branching pattern. The visualized proximal subclavian arteries are widely patent. RIGHT CAROTID SYSTEM: No dissection, occlusion or aneurysm. There is predominantly calcified atherosclerosis extending into the proximal ICA, resulting in 70% stenosis. LEFT CAROTID SYSTEM: Calcific atherosclerosis at the carotid bifurcation extending into the proximal ICA causing less than 50% stenosis. VERTEBRAL ARTERIES: Left dominant configuration. Both origins are clearly patent. There is no dissection, occlusion or flow-limiting stenosis to the skull base (V1-V3 segments). CTA HEAD FINDINGS POSTERIOR CIRCULATION: --Vertebral arteries: Normal V4 segments. --Inferior cerebellar arteries: Normal. --Basilar artery: Normal. --Superior cerebellar arteries: Normal. --Posterior cerebral arteries (PCA): Normal. ANTERIOR CIRCULATION: --Intracranial internal carotid arteries: Normal. --Anterior cerebral arteries (ACA): Normal. Both A1 segments are present. Patent anterior communicating artery (a-comm). --Middle cerebral arteries (MCA): Normal. VENOUS SINUSES: As permitted by contrast timing, patent. ANATOMIC VARIANTS: None Review of the MIP images confirms the above findings. CT Brain Perfusion Findings: ASPECTS: 10 CBF (<30%) Volume: 048mPerfusion (Tmax>6.0s) volume: 66m61mismatch Volume: 66mL74mfarction Location:None IMPRESSION: 1. No emergent large vessel occlusion or high-grade stenosis of the intracranial arteries. 2. Bilateral proximal internal carotid artery atherosclerosis resulting in 70% stenosis on the right and less than 50% stenosis on the left. 3. Normal CT perfusion. Electronically Signed   By: KeviUlyses Jarred.   On: 05/21/2022 23:10   CT HEAD CODE STROKE WO CONTRAST  Result Date: 05/21/2022 CLINICAL DATA:  Code stroke.  Altered mental status EXAM: CT HEAD WITHOUT CONTRAST TECHNIQUE: Contiguous axial images were obtained from the  base of the skull through the vertex without intravenous contrast. RADIATION DOSE REDUCTION: This exam was performed according to the departmental dose-optimization program which includes automated exposure control, adjustment of the mA and/or kV according to patient size and/or use of iterative reconstruction technique. COMPARISON:  None Available. FINDINGS: Brain: There is no mass, hemorrhage or extra-axial collection. The size and configuration of the ventricles and extra-axial CSF spaces are normal. The brain parenchyma is normal, without evidence of acute or chronic infarction. Vascular: No abnormal hyperdensity of the major intracranial arteries or dural venous sinuses. No  intracranial atherosclerosis. Skull: The visualized skull base, calvarium and extracranial soft tissues are normal. Sinuses/Orbits: No fluid levels or advanced mucosal thickening of the visualized paranasal sinuses. No mastoid or middle ear effusion. The orbits are normal. ASPECTS [facility] Stroke Program Early CT Score) - Ganglionic level infarction (caudate, lentiform nuclei, internal capsule, insula, M1-M3 cortex): 7 - Supraganglionic infarction (M4-M6 cortex): 3 Total score (0-10 with 10 being normal): 10 IMPRESSION: 1. No acute intracranial abnormality. 2. Aspects is 10. These results were called by telephone at the time of interpretation on 05/21/2022 at 10:18 pm to provider [facility] , who verbally acknowledged these results. Electronically Signed   By: Ulyses Jarred M.D.   On: 05/21/2022 22:18    Procedures Procedures    Medications Ordered in ED Medications  sodium chloride 0.9 % bolus 500 mL (0 mLs Intravenous Stopped 05/21/22 2328)    Followed by  0.9 %  sodium chloride infusion (100 mL/hr Intravenous New Bag/Given 05/21/22 2238)  iohexol (OMNIPAQUE) 350 MG/ML injection 100 mL (100 mLs Intravenous Contrast Given 05/21/22 2247)    ED Course/ Medical Decision Making/ A&P                           Medical  Decision Making Amount and/or Complexity of Data Reviewed Labs: ordered. Radiology: ordered.  Risk Prescription drug management. Decision regarding hospitalization.   This patient presents to the ED for concern of dizziness, this involves an extensive number of treatment options, and is a complaint that carries with it a high risk of complications and morbidity.  The differential diagnosis includes cva   Co morbidities that complicate the patient evaluation  htn, afib (on Eliquis), DM, OSA on cpap, dm2, aortic stenosis, arthritis, and cad   Additional history obtained:  Additional history obtained from epic chart review External records from outside source obtained and reviewed including wife   Lab Tests:  I Ordered, and personally interpreted labs.  The pertinent results include:  cbc with hgb 11.1 (chronic), cmp with cr 1.85 (last cr 1.5 on 11/8) and glucose elevated at 227, etoh <10, inr 1.1   Imaging Studies ordered:  I ordered imaging studies including ct head/cta head/neck  I independently visualized and interpreted imaging which showed  CT head: . No acute intracranial abnormality.  2. Aspects is 10.    These results were called by telephone at the time of interpretation  on 05/21/2022 at 10:18 pm to provider [facility] , who verbally  acknowledged these results.  CT angio head/neck: . No emergent large vessel occlusion or high-grade stenosis of the  intracranial arteries.  2. Bilateral proximal internal carotid artery atherosclerosis  resulting in 70% stenosis on the right and less than 50% stenosis on  the left.  3. Normal CT perfusion.   I agree with the radiologist interpretation   Cardiac Monitoring:  The patient was maintained on a cardiac monitor.  I personally viewed and interpreted the cardiac monitored which showed an underlying rhythm of: nsr   Medicines ordered and prescription drug management:  I ordered medication including ivfs  for  dehydration  Reevaluation of the patient after these medicines showed that the patient improved I have reviewed the patients home medicines and have made adjustments as needed   Test Considered:  mri   Critical Interventions:  Code stroke   Consultations Obtained:  I requested consultation with the neurologist,  and discussed lab and imaging findings as well as pertinent plan -  she recommends admission for stroke work up Pt d/w Dr. Josephine Cables (triad) for admission.   Problem List / ED Course:  Dizziness:  Code stroke called when I saw pt in the room.  Possible cva.  He will need an admission for stroke work up.  He will also need eval of his carotid artery stenosis as well.   Reevaluation:  After the interventions noted above, I reevaluated the patient and found that they have :improved   Social Determinants of Health:  Lives at home with wife   Dispostion:  After consideration of the diagnostic results and the patients response to treatment, I feel that the patent would benefit from admission.          Final Clinical Impression(s) / ED Diagnoses Final diagnoses:  Dizziness  Internal carotid artery stenosis, bilateral    Rx / DC Orders ED Discharge Orders     None         Isla Pence, MD 05/21/22 2338

## 2022-05-22 ENCOUNTER — Observation Stay (HOSPITAL_COMMUNITY): Payer: HMO

## 2022-05-22 ENCOUNTER — Observation Stay (HOSPITAL_BASED_OUTPATIENT_CLINIC_OR_DEPARTMENT_OTHER): Payer: HMO

## 2022-05-22 DIAGNOSIS — I6389 Other cerebral infarction: Secondary | ICD-10-CM

## 2022-05-22 DIAGNOSIS — I10 Essential (primary) hypertension: Secondary | ICD-10-CM | POA: Diagnosis not present

## 2022-05-22 DIAGNOSIS — I482 Chronic atrial fibrillation, unspecified: Secondary | ICD-10-CM | POA: Diagnosis not present

## 2022-05-22 DIAGNOSIS — N179 Acute kidney failure, unspecified: Secondary | ICD-10-CM | POA: Diagnosis not present

## 2022-05-22 DIAGNOSIS — E1165 Type 2 diabetes mellitus with hyperglycemia: Secondary | ICD-10-CM | POA: Diagnosis not present

## 2022-05-22 DIAGNOSIS — R42 Dizziness and giddiness: Secondary | ICD-10-CM

## 2022-05-22 DIAGNOSIS — R4781 Slurred speech: Secondary | ICD-10-CM | POA: Insufficient documentation

## 2022-05-22 LAB — ECHOCARDIOGRAM COMPLETE
AR max vel: 2.21 cm2
AV Area VTI: 2.4 cm2
AV Area mean vel: 2.18 cm2
AV Mean grad: 5 mmHg
AV Peak grad: 10.7 mmHg
Ao pk vel: 1.64 m/s
Area-P 1/2: 2.39 cm2
Height: 70 in
MV VTI: 2.38 cm2
S' Lateral: 3 cm
Weight: 3039.98 oz

## 2022-05-22 LAB — LIPID PANEL
Cholesterol: 79 mg/dL (ref 0–200)
HDL: 25 mg/dL — ABNORMAL LOW (ref >40)
LDL Cholesterol: 41 mg/dL (ref 0–99)
Total CHOL/HDL Ratio: 3.2 RATIO
Triglycerides: 63 mg/dL (ref <150)
VLDL: 13 mg/dL (ref 0–40)

## 2022-05-22 LAB — CBG MONITORING, ED
Glucose-Capillary: 120 mg/dL — ABNORMAL HIGH (ref 70–99)
Glucose-Capillary: 100 mg/dL — ABNORMAL HIGH (ref 70–99)

## 2022-05-22 LAB — HEMOGLOBIN A1C
Hgb A1c MFr Bld: 6.5 % — ABNORMAL HIGH (ref 4.8–5.6)
Mean Plasma Glucose: 139.85 mg/dL

## 2022-05-22 MED ORDER — INSULIN ASPART 100 UNIT/ML IJ SOLN: 0.0000 [IU] | Freq: Every day | INTRAMUSCULAR | Status: DC

## 2022-05-22 MED ORDER — INSULIN GLARGINE-YFGN 100 UNIT/ML ~~LOC~~ SOLN
10.0000 [IU] | Freq: Every day | SUBCUTANEOUS | Status: DC
  Filled 2022-05-22: qty 0.1

## 2022-05-22 MED ORDER — INSULIN ASPART 100 UNIT/ML IJ SOLN: 0.0000 [IU] | Freq: Three times a day (TID) | INTRAMUSCULAR | Status: DC

## 2022-05-22 NOTE — Progress Notes (Signed)
Code stroke  Call time  [address] [facility]   2204 Start   2209 End   2214 Soc   2214 Epic   2214 Rad   2215

## 2022-05-22 NOTE — Evaluation (Signed)
Physical Therapy Evaluation Patient Details Name: [name] [name] MRN: [id number] DOB: [birth year] Today's Date: 05/22/2022  History of Present Illness  [name] [name] is a 66 y.o. male with medical history significant of hypertension, atrial fibrillation on Eliquis, type 2 diabetes mellitus, OSA on CPAP, CAD who presents to the emergency department due to sudden onset of dizziness and difficulty with talking which occurred when he woke up at 7:30 PM this evening.  Last well-known was 6:30 PM prior to going to take a nap.  Per wife at bedside, patient was having difficulty in getting medication out of her pillbox and was having problem in being able to tie lace of shoes.  At bedside, patient states that his speech has not returned to normal, though, there has been an improvement since onset of symptoms.   Clinical Impression  Patient functioning near baseline for functional mobility and gait. Patient demonstrates good return for hallway ambulation without AD/loss of balance. Patient transferred to transfer chair following ambulation to be taken to MRI with radiology staff. Plan: patient discharged from physical therapy to care of nursing for ambulation daily as tolerated for length of stay.     Recommendations for follow up therapy are one component of a multi-disciplinary discharge planning process, led by the attending physician.  Recommendations may be updated based on patient status, additional functional criteria and insurance authorization.  Follow Up Recommendations No PT follow up      Assistance Recommended at Discharge PRN  Patient can return home with the following  Help with stairs or ramp for entrance;Assistance with cooking/housework    Equipment Recommendations None recommended by PT  Recommendations for Other Services       Functional Status Assessment Patient has not had a recent decline in their functional status     Precautions / Restrictions Precautions Precautions:  None Restrictions Weight Bearing Restrictions: No      Mobility  Bed Mobility Overal bed mobility: Independent             General bed mobility comments: no use of bed rails or assist, normal time for supine to sit    Transfers Overall transfer level: Independent Equipment used: None               General transfer comment: patient able to transfer to transfer chair following ambulation without assist/AD    Ambulation/Gait Ambulation/Gait assistance: Supervision Gait Distance (Feet): 100 Feet Assistive device: None Gait Pattern/deviations: Decreased step length - right, Decreased step length - left, Decreased stride length Gait velocity: decreased     General Gait Details: patient demonstrates good return for ambulation in hallway without AD/loss of balance, slowed cadence  Stairs            Wheelchair Mobility    Modified Rankin (Stroke Patients Only)       Balance Overall balance assessment: Mild deficits observed, not formally tested                                           Pertinent Vitals/Pain Pain Assessment Pain Assessment: Faces Faces Pain Scale: No hurt    Home Living Family/patient expects to be discharged to:: Private residence Living Arrangements: Spouse/significant other Available Help at Discharge: Family;Available 24 hours/day Type of Home: House Home Access: Stairs to enter Entrance Stairs-Rails: None Entrance Stairs-Number of Steps: 2-3   Home Layout: One level Home Equipment: Rolling  Walker (2 wheels);Cane - single point;BSC/3in1;Grab bars - tub/shower      Prior Function Prior Level of Function : Independent/Modified Independent;Driving             Mobility Comments: Short distance community ambulator w/o AD, drives ADLs Comments: Independent     Hand Dominance        Extremity/Trunk Assessment   Upper Extremity Assessment Upper Extremity Assessment: Defer to OT evaluation    Lower  Extremity Assessment Lower Extremity Assessment: Overall WFL for tasks assessed    Cervical / Trunk Assessment Cervical / Trunk Assessment: Normal  Communication   Communication: HOH  Cognition Arousal/Alertness: Awake/alert Behavior During Therapy: WFL for tasks assessed/performed Overall Cognitive Status: Within Functional Limits for tasks assessed                                          General Comments      Exercises     Assessment/Plan    PT Assessment Patient does not need any further PT services  PT Problem List         PT Treatment Interventions      PT Goals (Current goals can be found in the Care Plan section)  Acute Rehab PT Goals Patient Stated Goal: return home PT Goal Formulation: With patient Time For Goal Achievement: 05/22/22 Potential to Achieve Goals: Good    Frequency       Co-evaluation PT/OT/SLP Co-Evaluation/Treatment: Yes Reason for Co-Treatment: To address functional/ADL transfers PT goals addressed during session: Mobility/safety with mobility;Balance         AM-PAC PT "6 Clicks" Mobility  Outcome Measure Help needed turning from your back to your side while in a flat bed without using bedrails?: None Help needed moving from lying on your back to sitting on the side of a flat bed without using bedrails?: None Help needed moving to and from a bed to a chair (including a wheelchair)?: None Help needed standing up from a chair using your arms (e.g., wheelchair or bedside chair)?: None Help needed to walk in hospital room?: None Help needed climbing 3-5 steps with a railing? : A Little 6 Click Score: 23    End of Session   Activity Tolerance: Patient tolerated treatment well Patient left: in chair;Other (comment) (patient left in transfer chair with radiology staff taking pt to MRI) Nurse Communication: Mobility status PT Visit Diagnosis: Other abnormalities of gait and mobility (R26.89);Unsteadiness on feet  (R26.81);Muscle weakness (generalized) (M62.81)    Time: 0981-1914 PT Time Calculation (min) (ACUTE ONLY): 15 min   Charges:   PT Evaluation $PT Eval Low Complexity: 1 Low PT Treatments $Therapeutic Activity: 8-22 mins        Alan Mulder, SPT

## 2022-05-22 NOTE — Progress Notes (Signed)
Patient was asked earlier in shift if he wore CPAP at home because he has history noted in his chart and was holding in the ED tonight. Patient declined hospital CPAP and stated that he hasn't been wearing one. RN made aware, no unit in room at this time.

## 2022-05-22 NOTE — Discharge Instructions (Addendum)
1)You are taking Eliquis/Apixaban which is a Blood Thinner--so Avoid ibuprofen/Advil/Aleve/Motrin/Goody Powders/Naproxen/BC powders/Meloxicam/Diclofenac/Indomethacin and other Nonsteroidal anti-inflammatory medications as these will make you more likely to bleed and can cause stomach ulcers, can also cause Kidney problems.   2)Please Take your Eliquis/Apixaban as ordered---  3)follow up with PCP for Recheck and repeat BMP Blood test in about 1 week   4)follow up with vascular surgery due to concerns about Bilateral proximal internal carotid artery atherosclerosis resulting in 70% stenosis on the right --- possible blockage of the arteries in your neck on the right side

## 2022-05-22 NOTE — ED Notes (Signed)
Pt ambulatory to bathroom, nurse watched pt walk to restroom, steady normal gait, pt denies dizziness, pt says he needs to have BM.

## 2022-05-22 NOTE — ED Notes (Signed)
Pt ambulatory to waiting room. Pt verbalized understanding of discharge instructions.   

## 2022-05-22 NOTE — Progress Notes (Signed)
SLP Cancellation Note  Patient Details Name: ZYDEN SUMAN MRN: [id number] DOB: 09-13-1955   Cancelled treatment:       Reason Eval/Treat Not Completed: Pt passed [facility] stroke swallow screen, therefore will complete order for BSE. Thank you,  Arius Harnois H. Romie Levee, CCC-SLP Speech Language Pathologist    Georgetta Haber 05/22/2022, 8:55 AM

## 2022-05-22 NOTE — ED Notes (Signed)
Pt given water per request

## 2022-05-22 NOTE — Evaluation (Signed)
Occupational Therapy Evaluation Patient Details Name: [name] [name] MRN: [id number] DOB: January 06, 1956 Today's Date: 05/22/2022   History of Present Illness [name] [name] is a 67 y.o. male with medical history significant of hypertension, atrial fibrillation on Eliquis, type 2 diabetes mellitus, OSA on CPAP, CAD who presents to the emergency department due to sudden onset of dizziness and difficulty with talking which occurred when he woke up at 7:30 PM this evening.  Last well-known was 6:30 PM prior to going to take a nap.  Per wife at bedside, patient was having difficulty in getting medication out of her pillbox and was having problem in being able to tie lace of shoes.  At bedside, patient states that his speech has not returned to normal, though, there has been an improvement since onset of symptoms.   Clinical Impression   Pt was agreeable to OT/PT evaluation. He reports that prior to hospitalization he was independent with all ADL's and driving. He does not use a mobility device at baseline. Pt completed bed mobility, t/f and functional mobility around unit independently. MMT for UE is [facility] and he reports no numbness or tingling to UE. Pt does not need continued OT services and is safe to d/c home.      Recommendations for follow up therapy are one component of a multi-disciplinary discharge planning process, led by the attending physician.  Recommendations may be updated based on patient status, additional functional criteria and insurance authorization.   Follow Up Recommendations  No OT follow up     Assistance Recommended at Discharge None        Functional Status Assessment  Patient has had a recent decline in their functional status and demonstrates the ability to make significant improvements in function in a reasonable and predictable amount of time.  Equipment Recommendations  None recommended by OT           Precautions / Restrictions Precautions Precautions:  None Restrictions Weight Bearing Restrictions: No      Mobility Bed Mobility Overal bed mobility: Independent             General bed mobility comments: no use of bed rails or assist, normal time for supine to sit    Transfers Overall transfer level: Independent Equipment used: None               General transfer comment: patient able to transfer to transfer chair following ambulation without assist/AD      Balance Overall balance assessment: Mild deficits observed, not formally tested                                         ADL either performed or assessed with clinical judgement   ADL Overall ADL's : Independent;At baseline                                             Vision Baseline Vision/History: 1 Wears glasses Ability to See in Adequate Light: 0 Adequate Patient Visual Report: No change from baseline Vision Assessment?: No apparent visual deficits                Pertinent Vitals/Pain Pain Assessment Pain Assessment: No/denies pain     Hand Dominance     Extremity/Trunk Assessment Upper Extremity Assessment Upper  Extremity Assessment: Overall WFL for tasks assessed   Lower Extremity Assessment Lower Extremity Assessment: Defer to PT evaluation   Cervical / Trunk Assessment Cervical / Trunk Assessment: Normal   Communication Communication Communication: HOH   Cognition   Behavior During Therapy: WFL for tasks assessed/performed Overall Cognitive Status: Within Functional Limits for tasks assessed                                                        Home Living Family/patient expects to be discharged to:: Private residence Living Arrangements: Spouse/significant other Available Help at Discharge: Family;Available 24 hours/day Type of Home: House Home Access: Stairs to enter [employer] of Steps: 2-3 Entrance Stairs-Rails: None Home Layout: One level      Bathroom Shower/Tub: Producer, television/film/video: Handicapped height Bathroom Accessibility: Yes How Accessible: Accessible via walker Home Equipment: Agricultural consultant (2 wheels);Cane - single point;BSC/3in1;Grab bars - tub/shower          Prior Functioning/Environment Prior Level of Function : Independent/Modified Independent;Driving             Mobility Comments: Short distance community ambulator w/o AD, drives ADLs Comments: Independent                      OT Goals(Current goals can be found in the care plan section) Acute Rehab OT Goals Patient Stated Goal: to go home Potential to Achieve Goals: Good  OT Frequency:      Co-evaluation PT/OT/SLP Co-Evaluation/Treatment: Yes Reason for Co-Treatment: To address functional/ADL transfers PT goals addressed during session: Mobility/safety with mobility;Balance OT goals addressed during session: ADL's and self-care      AM-PAC OT "6 Clicks" Daily Activity     Outcome Measure Help from another person eating meals?: None Help from another person taking care of personal grooming?: None Help from another person toileting, which includes using toliet, bedpan, or urinal?: None Help from another person bathing (including washing, rinsing, drying)?: None Help from another person to put on and taking off regular upper body clothing?: None Help from another person to put on and taking off regular lower body clothing?: None 6 Click Score: 24   End of Session Nurse Communication: Mobility status  Activity Tolerance: Patient tolerated treatment well Patient left: Other (comment) (Pt returned to w/c for CT scan)                   Time: 2111-7356 OT Time Calculation (min): 17 min Charges:  OT General Charges $OT Visit: 1 Visit OT Evaluation $OT Eval Low Complexity: 1 Low  Bevelyn Ngo, OTR/L  05/22/2022, 9:36 AM

## 2022-05-22 NOTE — ED Notes (Signed)
Echo at bedside at this time

## 2022-05-22 NOTE — Progress Notes (Signed)
*  PRELIMINARY RESULTS* Echocardiogram 2D Echocardiogram has been performed.  [name] [name] 05/22/2022, 11:26 AM

## 2022-05-22 NOTE — TOC Progression Note (Signed)
  Transition of Care [facility]) Screening Note   Patient Details  Name: [name] [name] Date of Birth: [birth year]   Transition of Care [facility]) CM/SW Contact:    Elliot Gault, LCSW Phone Number: 05/22/2022, 11:00 AM    Transition of Care Department [facility]) has reviewed patient and no TOC needs have been identified at this time. We will continue to monitor patient advancement through interdisciplinary progression rounds. If new patient transition needs arise, please place a TOC consult.

## 2022-05-22 NOTE — Discharge Summary (Signed)
[name] [name], is a 66 y.o. male  DOB 11-Mar-1956  MRN [id number].  Admission date:  05/21/2022  Admitting Physician  Bernadette Hoit, DO  Discharge Date:  05/22/2022   Primary MD  Dettinger, Fransisca Kaufmann, MD  Recommendations for primary care physician for things to follow:   1)You are taking Eliquis/Apixaban which is a Blood Thinner--so Avoid ibuprofen/Advil/Aleve/Motrin/Goody Powders/Naproxen/BC powders/Meloxicam/Diclofenac/Indomethacin and other Nonsteroidal anti-inflammatory medications as these will make you more likely to bleed and can cause stomach ulcers, can also cause Kidney problems.   2)Please Take your Eliquis/Apixaban as ordered---  3)Follow up with PCP for Recheck and repeat BMP Blood test in about 1 week   Admission Diagnosis  Acute ischemic stroke [facility]) [I63.9]   Discharge Diagnosis  Acute ischemic stroke ([place]) [I63.9]    Active Problems:   Essential hypertension   OSA on CPAP   Atrial fibrillation, chronic ([place])   Uncontrolled type 2 diabetes mellitus with hyperglycemia, with long-term current use of insulin ([place])   Acute kidney injury superimposed on chronic kidney disease ([place])   Dizziness   Slurred speech      Past Medical History:  Diagnosis Date   Aortic stenosis 8/09   Bicuspid aortic valve   Arthritis 6/10   hand    Atrial fibrillation ([place])    post op only   Bile duct calculus with acute cholecystitis    CAD (coronary artery disease) 8/09   Calculus of common bile duct with acute pancreatitis 11/2016   CTS (carpal tunnel syndrome)    bilateral    Diabetes (HCC)    Diastolic dysfunction 3/82   Elevated LFTs    Fatty liver disease, nonalcoholic    History of kidney stones    Hyperlipidemia    Hypertension    Kidney stones    Nasal congestion    NIDDM (non-insulin dependent diabetes mellitus)    x15 yrs   OSA on CPAP 2006   Paresthesias in left hand 02/19/2004    Paresthesias in right hand 02/19/2004   Sinusitis    Vitamin D deficiency 06/13/09    Past Surgical History:  Procedure Laterality Date   bladder cancer - operation - 01/21/18 - Dr Tresa Balis  01/21/2018   CARPAL TUNNEL RELEASE     bilateral    CHOLECYSTECTOMY  11/2016   COLONOSCOPY N/A 09/22/2018   Procedure: COLONOSCOPY;  Surgeon: Rogene Houston, MD;  [facility];  Service: Endoscopy;  Laterality: N/A;  1:00   CORONARY ANGIOPLASTY WITH STENT PLACEMENT     to rt coronary atery (Dr. Degert-cardiologist)   [place], URETEROSCOPY AND STENT PLACEMENT Left 03/07/2018   Procedure: CYSTOSCOPY WITH RETROGRADE PYELOGRAM,  AND STENT PLACEMENT;  Surgeon: [name] Frock, MD;  Location: [facility];  Service: Urology;  Laterality: Left;   ERCP N/A 11/24/2016   Procedure: ENDOSCOPIC RETROGRADE CHOLANGIOPANCREATOGRAPHY (ERCP);  Surgeon: Rogene Houston, MD;  Location[facility];  Service: Endoscopy;  Laterality: N/A;   EXTRACORPOREAL SHOCK WAVE LITHOTRIPSY Left 03/17/2018   Procedure: LEFT  EXTRACORPOREAL SHOCK WAVE LITHOTRIPSY (ESWL);  Surgeon: Irine Seal, MD;  Location: [facility];  Service: Urology;  Laterality: Left;   REMOVAL OF STONES N/A 11/24/2016   Procedure: REMOVAL OF STONES;  Surgeon: Rogene Houston, MD;  [facility];  Service: Endoscopy;  Laterality: N/A;   SPHINCTEROTOMY N/A 11/24/2016   Procedure: SPHINCTEROTOMY;  Surgeon: Rogene Houston, MD;  [facility];  Service: Endoscopy;  Laterality: N/A;   TISSUE AORTIC VALVE REPLACEMENT     2012    HPI  from the history and physical done on the day of admission:    HPI: [name] [name] is a 66 y.o. male with medical history significant of hypertension, atrial fibrillation on Eliquis, type 2 diabetes mellitus, OSA on CPAP, CAD who presents to the emergency department due to sudden onset of dizziness and difficulty with talking which occurred when he woke up at 7:30 PM this evening.  Last  well-known was 6:30 PM prior to going to take a nap.  Per wife at bedside, patient was having difficulty in getting medication out of her pillbox and was having problem in being able to tie lace of shoes.  At bedside, patient states that his speech has not returned to normal, though, there has been an improvement since onset of symptoms.   ED Course:  In the emergency department, she was hemodynamically stable.  Work-up in the ED showed normocytic anemia, BMP was normal except for hyperglycemia, BUN/creatinine 33/1.85 (baseline creatinine at 1.4-1.6).  Alcohol level was less than 10. CT angiography of the neck showed no emergent LVO or high-grade stenosis of the intracranial arteries. Bilateral proximal internal carotid artery atherosclerosis resulting in 70% stenosis on the right and less than 50% stenosis on the left. CT head without contrast showed no acute intracranial abnormality IV hydration was provided.  Teleneurology was consulted and recommended further stroke work-up.  Hospitalist was asked to admit patient for further evaluation and management.     Review of Systems: Review of systems as noted in the HPI. All other systems reviewed and are negative.     Hospital Course:   Assessment and Plan:  1)Dizziness, slurred speech rule out acute ischemic stroke -Essentially negative stroke work-up at this time -Patient's neuro symptoms of completely resolved at this time -CT angiography of the neck and head showed no emergent LVO or high-grade stenosis of the intracranial arteries, follow up with Vascular Surgery Dr. Donnetta Hutching advised due to Bilateral proximal internal carotid artery atherosclerosis resulting in 70% stenosis on the right -- -CT head without contrast and CT perfusion without acute stroke -MRI brain without contrast without acute stroke Echocardiogram report is pending at this time MRI of brain without contrast in the morning Patient reports missing doses of Eliquis from time  to time  -Increased/improved compliance with Eliquis strongly emphasized  -Continue atorvastatin -LDL 41, HDL 25, total cholesterol 79---Even if his lipid panel is within desired limits, patient should still take Lipitor for it's Pleiotropic effects (beyond cholesterol lowering benefits) -A1c 6.5 -Repeat neuro exam reassuring -Telemetry neuro consult appreciated  2)DM2-A1c 6.5 reflecting good diabetic control PTA -Continue PTA diabetic regimen  3)Acute kidney injury on CKD 3A BUN/creatinine 33/1.85 (baseline creatinine at 1.4-1.6).  Renally adjust medications, avoid nephrotoxic agents/dehydration/hypotension -Repeat BMP within a week with PCP advised   4)Essential hypertension -Continue atenolol and Cardizem  5)PAFib--- continue Eliquis for stroke prophylaxis -Patient reports missing doses of Eliquis from time to time  -Increased/improved compliance with Eliquis strongly emphasized  -  Continue Cardizem and atenolol for rate control - 6)OSA on CPAP Continue CPAP   Discharge Condition: stable  Follow UP   Follow-up Information     Dettinger, Fransisca Kaufmann, MD Follow up in 1 week(s).   Specialties: Family Medicine, Cardiology Why: For Repeat BMP blood test Contact information: [place] [address] [phone]         Serafina Mitchell, MD. Schedule an appointment as soon as possible for a visit.   Specialties: Vascular Surgery, Cardiology Why: Bilateral proximal internal carotid artery atherosclerosis resulting in 70% stenosis on the right Contact information: [place] [address] [phone]         Rosetta Posner, MD. Schedule an appointment as soon as possible for a visit in 1 week(s).   Specialties: Vascular Surgery, Cardiology Why: Bilateral proximal internal carotid artery atherosclerosis resulting in 70% stenosis on the right Contact information: [place] [address] [phone]                   Consults obtained - tele-neuro  Diet and Activity recommendation:  As advised  Discharge Instructions    Discharge Instructions     Call MD for:  difficulty breathing, headache or visual disturbances   Complete by: As directed    Call MD for:  persistant dizziness or light-headedness   Complete by: As directed    Call MD for:  persistant nausea and vomiting   Complete by: As directed    Call MD for:  temperature >100.4   Complete by: As directed    Diet - low sodium heart healthy   Complete by: As directed    Diet Carb Modified   Complete by: As directed    Discharge instructions   Complete by: As directed    1)You are taking Eliquis/Apixaban which is a Blood Thinner--so Avoid ibuprofen/Advil/Aleve/Motrin/Goody Powders/Naproxen/BC powders/Meloxicam/Diclofenac/Indomethacin and other Nonsteroidal anti-inflammatory medications as these will make you more likely to bleed and can cause stomach ulcers, can also cause Kidney problems.   2)Please Take your Eliquis/Apixaban as ordered---  3)follow up with PCP for Recheck and repeat BMP Blood test in about 1 week  4)follow up with Vascular Surgery Dr. Donnetta Hutching due to Bilateral proximal internal carotid artery atherosclerosis resulting in 70% stenosis on the right --blockage of the artery in the neck especially on the right side   Increase activity slowly   Complete by: As directed        Discharge Medications     Allergies as of 05/22/2022       Reactions   Glyxambi [empagliflozin-linagliptin] Diarrhea, Itching   He was able to take Jardiance alone with no side effects so it was the Tradjenta part that most likely gave him the reaction   Hctz [hydrochlorothiazide]    Nausea and headache    Sulfa Antibiotics Nausea And Vomiting   Sulfonamide Derivatives         Medication List     STOP taking these medications    penicillin v potassium 500 MG tablet Commonly known as: VEETID       TAKE these medications     acetaminophen 325 MG tablet Commonly known as: TYLENOL Take 2 tablets (650 mg total) by mouth every 6 (six) hours as needed for mild pain (or Fever >/= 101).   apixaban 5 MG Tabs tablet Commonly known as: ELIQUIS Take 1 tablet (5 mg total) by mouth 2 (two) times daily.   atenolol 25 MG tablet  Commonly known as: TENORMIN Take 1 tablet (25 mg total) by mouth daily.   atorvastatin 40 MG tablet Commonly known as: LIPITOR Take 1 tablet (40 mg total) by mouth daily.   blood glucose meter kit and supplies Kit Dispense based on patient and insurance preference. Use up to four times daily as directed. (FOR ICD-9 250.00, 250.01).   cholecalciferol 1000 units tablet Commonly known as: VITAMIN D Take 2,000 Units by mouth 2 (two) times daily.   diltiazem 30 MG tablet Commonly known as: Cardizem Take 1 tablet (30 mg total) by mouth 2 (two) times daily.   glipiZIDE 5 MG tablet Commonly known as: GLUCOTROL Take 2 tablets (10 mg total) by mouth 2 (two) times daily before a meal.   Global Ease Inject Pen Needles 31G X 5 MM Misc Generic drug: Insulin Pen Needle Use daily as directed Dx E11.69   metFORMIN 1000 MG tablet Commonly known as: GLUCOPHAGE Take 1 tablet (1,000 mg total) by mouth 2 (two) times daily with a meal.   OneTouch Delica Plus [id number] Misc 4 (four) times daily.   OneTouch Ultra test strip Generic drug: glucose blood Test BS 4 times daily Dx E11.69   Rybelsus 14 MG Tabs Generic drug: Semaglutide Take 1 tablet (14 mg total) by mouth daily.   temazepam 15 MG capsule Commonly known as: RESTORIL TAKE ONE CAPSULE BY MOUTH AT BEDTIME AS NEEDED What changed:  how much to take how to take this when to take this reasons to take this additional instructions   Tresiba FlexTouch 100 UNIT/ML FlexTouch Pen Generic drug: insulin degludec Inject 10 Units into the skin daily. What changed: how much to take        Major procedures and Radiology Reports - PLEASE  review detailed and final reports for all details, in brief -  MR BRAIN WO CONTRAST  Result Date: 05/22/2022 CLINICAL DATA:  66 year old male status post code stroke presentation yesterday. Vertigo and slurred speech. CTA positive for bilateral ICA atherosclerosis and stenosis. EXAM: MRI HEAD WITHOUT CONTRAST TECHNIQUE: Multiplanar, multiecho pulse sequences of the brain and surrounding structures were obtained without intravenous contrast. COMPARISON:  CTA head and neck, plain head CT yesterday. FINDINGS: Brain: No restricted diffusion to suggest acute infarction. No midline shift, mass effect, evidence of mass lesion, ventriculomegaly, extra-axial collection or acute intracranial hemorrhage. Cervicomedullary junction and pituitary are within normal limits. Largely normal for age gray and white matter signal throughout the brain. Mild T2 and FLAIR heterogeneity in the pons. No cortical encephalomalacia or chronic cerebral blood products identified. Vascular: Major intracranial vascular flow voids are preserved, dominant left vertebral artery is demonstrated by CTA. There is some generalized intracranial artery tortuosity. Skull and upper cervical spine: Negative for age visible cervical spine. Visualized bone marrow signal is within normal limits. Sinuses/Orbits: Negative orbits. Mild to moderate ethmoid sinus mucosal thickening. Trace bilateral mastoid effusions. Negative visible nasopharynx. Other: Tympanic cavities and other visible internal auditory structures appear grossly normal. Negative stylomastoid foramina. Negative visible scalp and face. IMPRESSION: 1. No acute intracranial abnormality and essentially normal for age noncontrast MRI appearance of the brain. 2. Mild paranasal sinus inflammation and mild mastoid effusions which are likely postinflammatory. Electronically Signed   By: Genevie Ann M.D.   On: 05/22/2022 09:17   CT ANGIO HEAD NECK W WO CM W PERF (CODE STROKE)  Result Date:  05/21/2022 CLINICAL DATA:  Vertigo and slurred speech EXAM: CT ANGIOGRAPHY HEAD AND NECK CT PERFUSION BRAIN TECHNIQUE: Multidetector CT imaging of the  head and neck was performed using the standard protocol during bolus administration of intravenous contrast. Multiplanar CT image reconstructions and MIPs were obtained to evaluate the vascular anatomy. Carotid stenosis measurements (when applicable) are obtained utilizing NASCET criteria, using the distal internal carotid diameter as the denominator. Multiphase CT imaging of the brain was performed following IV bolus contrast injection. Subsequent parametric perfusion maps were calculated using RAPID software. RADIATION DOSE REDUCTION: This exam was performed according to the departmental dose-optimization program which includes automated exposure control, adjustment of the mA and/or kV according to patient size and/or use of iterative reconstruction technique. CONTRAST:  124m OMNIPAQUE IOHEXOL 350 MG/ML SOLN COMPARISON:  None Available. FINDINGS: CTA NECK FINDINGS SKELETON: There is no bony spinal canal stenosis. No lytic or blastic lesion. OTHER NECK: Normal pharynx, larynx and major salivary glands. No cervical lymphadenopathy. Unremarkable thyroid gland. UPPER CHEST: No pneumothorax or pleural effusion. No nodules or masses. AORTIC ARCH: There is calcific atherosclerosis of the aortic arch. There is no aneurysm, dissection or hemodynamically significant stenosis of the visualized portion of the aorta. Conventional 3 vessel aortic branching pattern. The visualized proximal subclavian arteries are widely patent. RIGHT CAROTID SYSTEM: No dissection, occlusion or aneurysm. There is predominantly calcified atherosclerosis extending into the proximal ICA, resulting in 70% stenosis. LEFT CAROTID SYSTEM: Calcific atherosclerosis at the carotid bifurcation extending into the proximal ICA causing less than 50% stenosis. VERTEBRAL ARTERIES: Left dominant configuration.  Both origins are clearly patent. There is no dissection, occlusion or flow-limiting stenosis to the skull base (V1-V3 segments). CTA HEAD FINDINGS POSTERIOR CIRCULATION: --Vertebral arteries: Normal V4 segments. --Inferior cerebellar arteries: Normal. --Basilar artery: Normal. --Superior cerebellar arteries: Normal. --Posterior cerebral arteries (PCA): Normal. ANTERIOR CIRCULATION: --Intracranial internal carotid arteries: Normal. --Anterior cerebral arteries (ACA): Normal. Both A1 segments are present. Patent anterior communicating artery (a-comm). --Middle cerebral arteries (MCA): Normal. VENOUS SINUSES: As permitted by contrast timing, patent. ANATOMIC VARIANTS: None Review of the MIP images confirms the above findings. CT Brain Perfusion Findings: ASPECTS: 10 CBF (<30%) Volume: 083mPerfusion (Tmax>6.0s) volume: 48m62mismatch Volume: 48mL31mfarction Location:None IMPRESSION: 1. No emergent large vessel occlusion or high-grade stenosis of the intracranial arteries. 2. Bilateral proximal internal carotid artery atherosclerosis resulting in 70% stenosis on the right and less than 50% stenosis on the left. 3. Normal CT perfusion. Electronically Signed   By: KeviUlyses Jarred.   On: 05/21/2022 23:10   CT HEAD CODE STROKE WO CONTRAST  Result Date: 05/21/2022 CLINICAL DATA:  Code stroke.  Altered mental status EXAM: CT HEAD WITHOUT CONTRAST TECHNIQUE: Contiguous axial images were obtained from the base of the skull through the vertex without intravenous contrast. RADIATION DOSE REDUCTION: This exam was performed according to the departmental dose-optimization program which includes automated exposure control, adjustment of the mA and/or kV according to patient size and/or use of iterative reconstruction technique. COMPARISON:  None Available. FINDINGS: Brain: There is no mass, hemorrhage or extra-axial collection. The size and configuration of the ventricles and extra-axial CSF spaces are normal. The brain parenchyma  is normal, without evidence of acute or chronic infarction. Vascular: No abnormal hyperdensity of the major intracranial arteries or dural venous sinuses. No intracranial atherosclerosis. Skull: The visualized skull base, calvarium and extracranial soft tissues are normal. Sinuses/Orbits: No fluid levels or advanced mucosal thickening of the visualized paranasal sinuses. No mastoid or middle ear effusion. The orbits are normal. ASPECTS ([facility] Early CT Score) - Ganglionic level infarction (caudate, lentiform nuclei, internal capsule, insula, M1-M3 cortex): 7 -  Supraganglionic infarction (M4-M6 cortex): 3 Total score (0-10 with 10 being normal): 10 IMPRESSION: 1. No acute intracranial abnormality. 2. Aspects is 10. These results were called by telephone at the time of interpretation on 05/21/2022 at 10:18 pm to provider [facility] , who verbally acknowledged these results. Electronically Signed   By: Ulyses Jarred M.D.   On: 05/21/2022 22:18    Today   Subjective    [name] [name] today has no New complaints        -No further speech concerns -No further neurodeficits -As per patient he is back to his usual self -Ambulating around the room without significant concerns no chest pains no palpitations no dizziness, no on exertion no leg pains no pleuritic symptoms no extremity weakness or numbness   Patient has been seen and examined prior to discharge   Objective   Blood pressure (!) 143/70, pulse (!) 57, temperature 98.4 F (36.9 C), temperature source Oral, resp. rate 14, height 5' 10" (1.778 m), weight 86.2 kg, SpO2 95 %.   Intake/Output Summary (Last 24 hours) at 05/22/2022 1243 Last data filed at 05/21/2022 2328 Gross per 24 hour  Intake 500 ml  Output --  Net 500 ml    Exam Gen:- Awake Alert, no acute distress  HEENT:- [place].AT, No sclera icterus Neck-Supple Neck,No JVD,.  Lungs-  CTAB , good air movement bilaterally CV- S1, S2 normal, irregular Abd-  +ve B.Sounds,  Abd Soft, No tenderness,    Extremity/Skin:- No  edema,   good pulses Psych-affect is appropriate, oriented x3 Neuro-no new focal deficits, no tremors    Data Review   CBC w Diff:  Lab Results  Component Value Date   WBC 6.1 05/21/2022   HGB 11.1 (L) 05/21/2022   HGB 12.6 (L) 05/13/2022   HCT 33.3 (L) 05/21/2022   HCT 37.1 (L) 05/13/2022   PLT 143 (L) 05/21/2022   PLT 195 05/13/2022   LYMPHOPCT 22 05/21/2022   MONOPCT 8 05/21/2022   EOSPCT 3 05/21/2022   BASOPCT 1 05/21/2022    CMP:  Lab Results  Component Value Date   NA 138 05/21/2022   NA 140 05/13/2022   K 4.5 05/21/2022   CL 107 05/21/2022   CO2 23 05/21/2022   BUN 33 (H) 05/21/2022   BUN 24 05/13/2022   CREATININE 1.85 (H) 05/21/2022   CREATININE 1.52 (H) 03/20/2020   PROT 6.6 05/21/2022   PROT 7.0 05/13/2022   ALBUMIN 3.8 05/21/2022   ALBUMIN 4.6 05/13/2022   BILITOT 0.4 05/21/2022   BILITOT 0.4 05/13/2022   ALKPHOS 65 05/21/2022   AST 20 05/21/2022   ALT 24 05/21/2022   Total Discharge time is about 33 minutes  Roxan Hockey M.D on 05/22/2022 at 12:43 PM  Go to [email or web address] -  for contact info  [facility] - Office  [phone]

## 2022-05-25 NOTE — Telephone Encounter (Signed)
Pt returned application to office.  Faxed pcp pages to [employer]

## 2022-05-31 ENCOUNTER — Other Ambulatory Visit: Payer: Self-pay | Admitting: Cardiology

## 2022-06-01 ENCOUNTER — Telehealth: Payer: Self-pay | Admitting: *Deleted

## 2022-06-01 MED ORDER — ASPIRIN 81 MG PO TBEC: 81.0000 mg | DELAYED_RELEASE_TABLET | Freq: Every day | ORAL | Status: AC

## 2022-06-01 NOTE — Telephone Encounter (Signed)
Lesle Chris, LPN 67/67/2094  5:58 PM EST Back to Top    Notified, copy to pcp.

## 2022-06-01 NOTE — Telephone Encounter (Signed)
-----   Message from Antoine Poche, MD sent at 06/01/2022  9:23 AM EST ----- Heart monitor did not show any afib. Appears isolated episodes in Jan in setting of COVID around that time without clear recurrence. Can come off eliquis and start just aspirin 81mg  daily  MD

## 2022-06-03 ENCOUNTER — Ambulatory Visit (INDEPENDENT_AMBULATORY_CARE_PROVIDER_SITE_OTHER): Payer: HMO | Admitting: Family Medicine

## 2022-06-03 ENCOUNTER — Encounter: Payer: Self-pay | Admitting: Family Medicine

## 2022-06-03 VITALS — BP 152/77 | HR 60 | Temp 96.2°F | Ht 70.0 in | Wt 191.0 lb

## 2022-06-03 DIAGNOSIS — Z09 Encounter for follow-up examination after completed treatment for conditions other than malignant neoplasm: Secondary | ICD-10-CM

## 2022-06-03 DIAGNOSIS — I6930 Unspecified sequelae of cerebral infarction: Secondary | ICD-10-CM | POA: Diagnosis not present

## 2022-06-03 DIAGNOSIS — I6523 Occlusion and stenosis of bilateral carotid arteries: Secondary | ICD-10-CM

## 2022-06-03 DIAGNOSIS — I639 Cerebral infarction, unspecified: Secondary | ICD-10-CM

## 2022-06-03 MED ORDER — APIXABAN 5 MG PO TABS: 5.0000 mg | ORAL_TABLET | Freq: Two times a day (BID) | ORAL | 1 refills | Status: DC

## 2022-06-03 NOTE — Progress Notes (Signed)
Subjective:  Patient ID: [name] [name], male    DOB: Sep 01, 1955, 66 y.o.   MRN: [id number]  Patient Care Team: Dettinger, Fransisca Kaufmann, MD as PCP - General (Family Medicine) Harl Bowie, Alphonse Guild, MD as PCP - Cardiology (Cardiology) Lavera Guise, [facility] (Pharmacist)   Chief Complaint:  hosptial follow up (AP 05/21/22 Dizziness and altered mental status )   HPI: [name] [name] is a 66 y.o. male presenting on 06/03/2022 for hosptial follow up (AP 05/21/22 Dizziness and altered mental status )   Today's visit was for Transitional Care Management. The patient was discharged from [facility] on 05/22/2022 with a primary diagnosis of Acute Ischemic Stroke.  Through chart review and discussion with the patient I have determined that management of their condition is of high complexity.   Pt went to the ED with complaints of dizziness and AMS on 05/21/2022. He admitted for acute ischemic stroke. He was discharged 05/22/2022. CT and MRI were negative for acute stroke. CT angiogram of neck revealed bilateral ICA stenosis with 70% on the right. He was to continue Eliquis and follow up with vascular surgery. States he was told by the cardiologist he could stop he Eliquis as his event monitor did not reveal A-Fib. He has not followed up with vascular surgery since discharge. He denies recurrent symptoms since discharge but is not taking Eliquis. Only on ASA and statin therapy.      Relevant past medical, surgical, family, and social history reviewed and updated as indicated.  Allergies and medications reviewed and updated. Data reviewed: Chart in Epic.   Past Medical History:  Diagnosis Date   Aortic stenosis 8/09   Bicuspid aortic valve   Arthritis 6/10   hand    Atrial fibrillation (HCC)    post op only   Bile duct calculus with acute cholecystitis    CAD (coronary artery disease) 8/09   Calculus of common bile duct with acute pancreatitis 11/2016   CTS (carpal tunnel syndrome)    bilateral     Diabetes ([place])    Diastolic dysfunction 7/00   Elevated LFTs    Fatty liver disease, nonalcoholic    History of kidney stones    Hyperlipidemia    Hypertension    Kidney stones    Nasal congestion    NIDDM (non-insulin dependent diabetes mellitus)    x15 yrs   OSA on CPAP 2006   Paresthesias in left hand 02/19/2004   Paresthesias in right hand 02/19/2004   Sinusitis    Vitamin D deficiency 06/13/09    Past Surgical History:  Procedure Laterality Date   bladder cancer - operation - 01/21/18 - Dr Tresa Maners  01/21/2018   CARPAL TUNNEL RELEASE     bilateral    CHOLECYSTECTOMY  11/2016   COLONOSCOPY N/A 09/22/2018   Procedure: COLONOSCOPY;  Surgeon: Rogene Houston, MD;  [facility]: [facility];  Service: Endoscopy;  Laterality: N/A;  1:00   CORONARY ANGIOPLASTY WITH STENT PLACEMENT     to rt coronary atery (Dr. Degert-cardiologist)   [place], URETEROSCOPY AND STENT PLACEMENT Left 03/07/2018   Procedure: CYSTOSCOPY WITH RETROGRADE PYELOGRAM,  AND STENT PLACEMENT;  Surgeon: [name] Frock, MD;  Location: [facility];  Service: Urology;  Laterality: Left;   ERCP N/A 11/24/2016   Procedure: ENDOSCOPIC RETROGRADE CHOLANGIOPANCREATOGRAPHY (ERCP);  Surgeon: Rogene Houston, MD;  Location: [facility];  Service: Endoscopy;  Laterality: N/A;   EXTRACORPOREAL SHOCK WAVE LITHOTRIPSY Left 03/17/2018   Procedure:  LEFT EXTRACORPOREAL SHOCK WAVE LITHOTRIPSY (ESWL);  Surgeon: Irine Seal, MD;  Location: [facility];  Service: Urology;  Laterality: Left;   REMOVAL OF STONES N/A 11/24/2016   Procedure: REMOVAL OF STONES;  Surgeon: Rogene Houston, MD;  [facility]: [facility];  Service: Endoscopy;  Laterality: N/A;   SPHINCTEROTOMY N/A 11/24/2016   Procedure: SPHINCTEROTOMY;  Surgeon: Rogene Houston, MD;  [facility]: [facility];  Service: Endoscopy;  Laterality: N/A;   TISSUE AORTIC VALVE REPLACEMENT     2012    Social History   Socioeconomic History   Marital status: Married     Spouse name: Coralyn Mark   Number of children: 3   Years of education: Not on file   Highest education level: High school graduate  Occupational History   Occupation: Retired  Tobacco Use   Smoking status: Former    Packs/day: 1.50    Years: 25.00    Total pack years: 37.50    Types: Cigarettes    Start date: 07/06/1974    Quit date: 07/06/2002    Years since quitting: 19.9   Smokeless tobacco: Never  Vaping Use   Vaping Use: Never used  Substance and Sexual Activity   Alcohol use: No    Alcohol/week: 0.0 standard drinks of alcohol   Drug use: No   Sexual activity: Not on file  Other Topics Concern   Not on file  Social History Narrative   Not on file   Social Determinants of Health   Financial Resource Strain: Low Risk  (12/02/2021)   Overall Financial Resource Strain (CARDIA)    Difficulty of Paying Living Expenses: Not hard at all  Food Insecurity: No Food Insecurity (12/02/2021)   Hunger Vital Sign    Worried About Running Out of Food in the Last Year: Never true    [place] in the Last Year: Never true  Transportation Needs: No Transportation Needs (12/02/2021)   PRAPARE - Hydrologist (Medical): No    Lack of Transportation (Non-Medical): No  Physical Activity: Sufficiently Active (12/02/2021)   Exercise Vital Sign    Days of Exercise per Week: 7 days    Minutes of Exercise per Session: 50 min  Stress: No Stress Concern Present (12/02/2021)   [place]    Feeling of Stress : Not at all  Social Connections: [place] (12/02/2021)   Social Connection and Isolation Panel [NHANES]    Frequency of Communication with Friends and Family: More than three times a week    Frequency of Social Gatherings with Friends and Family: More than three times a week    Attends Religious Services: More than 4 times per year    Active Member of [employer] or Organizations: Yes     Attends Music therapist: More than 4 times per year    Marital Status: Married  Human resources officer Violence: Not At Risk (12/02/2021)   Humiliation, Afraid, Rape, and Kick questionnaire    Fear of Current or Ex-Partner: No    Emotionally Abused: No    Physically Abused: No    Sexually Abused: No    Outpatient Encounter Medications as of 06/03/2022  Medication Sig   acetaminophen (TYLENOL) 325 MG tablet Take 2 tablets (650 mg total) by mouth every 6 (six) hours as needed for mild pain (or Fever >/= 101).   aspirin EC 81 MG tablet Take 1 tablet (81 mg total) by mouth daily.  Swallow whole.   atenolol (TENORMIN) 25 MG tablet Take 1 tablet (25 mg total) by mouth daily.   atorvastatin (LIPITOR) 40 MG tablet Take 1 tablet (40 mg total) by mouth daily.   blood glucose meter kit and supplies KIT Dispense based on patient and insurance preference. Use up to four times daily as directed. (FOR ICD-9 250.00, 250.01).   cholecalciferol (VITAMIN D) 1000 UNITS tablet Take 2,000 Units by mouth 2 (two) times daily.   diltiazem (CARDIZEM) 30 MG tablet TAKE 1 TABLET BY MOUTH TWICE DAILY   glipiZIDE (GLUCOTROL) 5 MG tablet Take 2 tablets (10 mg total) by mouth 2 (two) times daily before a meal.   insulin degludec (TRESIBA FLEXTOUCH) 100 UNIT/ML FlexTouch Pen Inject 10 Units into the skin daily. (Patient taking differently: Inject 14 Units into the skin daily.)   Insulin Pen Needle (GLOBAL EASE INJECT PEN NEEDLES) 31G X 5 MM MISC Use daily as directed Dx E11.69   Lancets (ONETOUCH DELICA PLUS [id number]) MISC 4 (four) times daily.   metFORMIN (GLUCOPHAGE) 1000 MG tablet Take 1 tablet (1,000 mg total) by mouth 2 (two) times daily with a meal.   ONETOUCH ULTRA test strip Test BS 4 times daily Dx E11.69   Semaglutide (RYBELSUS) 14 MG TABS Take 1 tablet (14 mg total) by mouth daily.   temazepam (RESTORIL) 15 MG capsule TAKE ONE CAPSULE BY MOUTH AT BEDTIME AS NEEDED (Patient taking differently: Take 15 mg  by mouth at bedtime as needed for sleep.)   apixaban (ELIQUIS) 5 MG TABS tablet Take 1 tablet (5 mg total) by mouth 2 (two) times daily.   No facility-administered encounter medications on file as of 06/03/2022.    Allergies  Allergen Reactions   Glyxambi [Empagliflozin-Linagliptin] Diarrhea and Itching    He was able to take Jardiance alone with no side effects so it was the Tradjenta part that most likely gave him the reaction   Hctz [Hydrochlorothiazide]     Nausea and headache    Sulfa Antibiotics Nausea And Vomiting   Sulfonamide Derivatives     Review of Systems  Constitutional:  Negative for activity change, appetite change, chills, diaphoresis, fatigue, fever and unexpected weight change.  HENT:  Positive for hearing loss (chronic).   Eyes: Negative.  Negative for photophobia and visual disturbance.  Respiratory:  Negative for cough, chest tightness and shortness of breath.   Cardiovascular:  Negative for chest pain, palpitations and leg swelling.  Gastrointestinal:  Negative for abdominal pain, blood in stool, constipation, diarrhea, nausea and vomiting.  Endocrine: Negative.   Genitourinary:  Negative for decreased urine volume, difficulty urinating, dysuria, frequency and urgency.  Musculoskeletal:  Negative for arthralgias and myalgias.  Skin: Negative.   Allergic/Immunologic: Negative.   Neurological:  Negative for dizziness, tremors, seizures, syncope, facial asymmetry, speech difficulty, weakness, light-headedness, numbness and headaches.  Hematological: Negative.   Psychiatric/Behavioral:  Negative for confusion, hallucinations, sleep disturbance and suicidal ideas.   All other systems reviewed and are negative.       Objective:  BP (!) 152/77   Pulse 60   Temp (!) 96.2 F (35.7 C) (Temporal)   Ht _0  (1.778 m)   Wt 191 lb (86.6 kg)   SpO2 99%   BMI 27.41 kg/m    Wt Readings from Last 3 Encounters:  06/03/22 191 lb (86.6 kg)  05/21/22 190 lb (86.2  kg)  05/13/22 190 lb (86.2 kg)    Physical Exam Vitals and nursing note reviewed.  Constitutional:  General: He is not in acute distress.    Appearance: Normal appearance. He is well-developed and well-groomed. He is not ill-appearing, toxic-appearing or diaphoretic.  HENT:     Head: Normocephalic and atraumatic.     Jaw: There is normal jaw occlusion.     Right Ear: Hearing normal.     Left Ear: Hearing normal.     Ears:     Comments: Bilateral hearing aids    Nose: Nose normal.     Mouth/Throat:     Lips: Pink.     Mouth: Mucous membranes are moist.     Pharynx: Oropharynx is clear. Uvula midline.  Eyes:     General: Lids are normal.     Extraocular Movements: Extraocular movements intact.     Conjunctiva/sclera: Conjunctivae normal.     Pupils: Pupils are equal, round, and reactive to light.  Neck:     Thyroid: No thyroid mass, thyromegaly or thyroid tenderness.     Vascular: Carotid bruit present. No JVD.     Trachea: Trachea and phonation normal.  Cardiovascular:     Rate and Rhythm: Normal rate and regular rhythm.     Chest Wall: PMI is not displaced.     Pulses: Normal pulses.     Heart sounds: Normal heart sounds. No murmur heard.    No friction rub. No gallop.  Pulmonary:     Effort: Pulmonary effort is normal. No respiratory distress.     Breath sounds: Normal breath sounds. No wheezing.  Abdominal:     General: Bowel sounds are normal. There is no distension or abdominal bruit.     Palpations: Abdomen is soft. There is no hepatomegaly or splenomegaly.     Tenderness: There is no abdominal tenderness. There is no right CVA tenderness or left CVA tenderness.     Hernia: No hernia is present.  Musculoskeletal:        General: Normal range of motion.     Cervical back: Normal range of motion and neck supple.     Right lower leg: No edema.     Left lower leg: No edema.  Lymphadenopathy:     Cervical: No cervical adenopathy.  Skin:    General: Skin is warm  and dry.     Capillary Refill: Capillary refill takes less than 2 seconds.     Coloration: Skin is not cyanotic, jaundiced or pale.     Findings: No rash.  Neurological:     General: No focal deficit present.     Mental Status: He is alert and oriented to person, place, and time.     Cranial Nerves: No cranial nerve deficit.     Sensory: Sensation is intact. No sensory deficit.     Motor: Motor function is intact. No weakness.     Coordination: Coordination is intact. Coordination normal.     Gait: Gait is intact. Gait normal.     Deep Tendon Reflexes: Reflexes are normal and symmetric. Reflexes normal.  Psychiatric:        Attention and Perception: Attention and perception normal.        Mood and Affect: Mood and affect normal.        Speech: Speech normal.        Behavior: Behavior normal. Behavior is cooperative.        Thought Content: Thought content normal.        Cognition and Memory: Cognition and memory normal.        Judgment: Judgment normal.  Results for orders placed or performed during the hospital encounter of 05/21/22  Ethanol  Result Value Ref Range   Alcohol, Ethyl (B) <10 <10 mg/dL  Protime-INR  Result Value Ref Range   Prothrombin Time 13.6 11.4 - 15.2 seconds   INR 1.1 0.8 - 1.2  APTT  Result Value Ref Range   aPTT 28 24 - 36 seconds  CBC  Result Value Ref Range   WBC 6.1 4.0 - 10.5 K/uL   RBC 3.37 (L) 4.22 - 5.81 MIL/uL   Hemoglobin 11.1 (L) 13.0 - 17.0 g/dL   HCT 33.3 (L) 39.0 - 52.0 %   MCV 98.8 80.0 - 100.0 fL   MCH 32.9 26.0 - 34.0 pg   MCHC 33.3 30.0 - 36.0 g/dL   RDW 12.9 11.5 - 15.5 %   Platelets 143 (L) 150 - 400 K/uL   nRBC 0.0 0.0 - 0.2 %  Differential  Result Value Ref Range   Neutrophils Relative % 66 %   Neutro Abs 4.1 1.7 - 7.7 K/uL   Lymphocytes Relative 22 %   Lymphs Abs 1.3 0.7 - 4.0 K/uL   Monocytes Relative 8 %   Monocytes Absolute 0.5 0.1 - 1.0 K/uL   Eosinophils Relative 3 %   Eosinophils Absolute 0.2 0.0 - 0.5 K/uL    Basophils Relative 1 %   Basophils Absolute 0.0 0.0 - 0.1 K/uL   Immature Granulocytes 0 %   Abs Immature Granulocytes 0.02 0.00 - 0.07 K/uL  Comprehensive metabolic panel  Result Value Ref Range   Sodium 138 135 - 145 mmol/L   Potassium 4.5 3.5 - 5.1 mmol/L   Chloride 107 98 - 111 mmol/L   CO2 23 22 - 32 mmol/L   Glucose, Bld 227 (H) 70 - 99 mg/dL   BUN 33 (H) 8 - 23 mg/dL   Creatinine, Ser 1.85 (H) 0.61 - 1.24 mg/dL   Calcium 8.9 8.9 - 10.3 mg/dL   Total Protein 6.6 6.5 - 8.1 g/dL   Albumin 3.8 3.5 - 5.0 g/dL   AST 20 15 - 41 U/L   ALT 24 0 - 44 U/L   Alkaline Phosphatase 65 38 - 126 U/L   Total Bilirubin 0.4 0.3 - 1.2 mg/dL   GFR, Estimated 40 (L) >60 mL/min   Anion gap 8 5 - 15  Lipid panel  Result Value Ref Range   Cholesterol 79 0 - 200 mg/dL   Triglycerides 63 <150 mg/dL   HDL 25 (L) >40 mg/dL   Total CHOL/HDL Ratio 3.2 RATIO   VLDL 13 0 - 40 mg/dL   LDL Cholesterol 41 0 - 99 mg/dL  Hemoglobin A1c  Result Value Ref Range   Hgb A1c MFr Bld 6.5 (H) 4.8 - 5.6 %   Mean Plasma Glucose 139.85 mg/dL  CBG monitoring, ED  Result Value Ref Range   Glucose-Capillary 216 (H) 70 - 99 mg/dL  CBG monitoring, ED  Result Value Ref Range   Glucose-Capillary 120 (H) 70 - 99 mg/dL  CBG monitoring, ED  Result Value Ref Range   Glucose-Capillary 100 (H) 70 - 99 mg/dL  ECHOCARDIOGRAM COMPLETE  Result Value Ref Range   Weight 3,039.98 oz   Height 70 in   BP 143/70 mmHg   AR max vel 2.21 cm2   AV Area VTI 2.40 cm2   AV Mean grad 5.0 mmHg   AV Peak grad 10.7 mmHg   Ao pk vel 1.64 m/s   AV Area mean vel 2.18  cm2   MV VTI 2.38 cm2   Area-P 1/2 2.39 cm2   S' Lateral 3.00 cm       Pertinent labs & imaging results that were available during my care of the patient were reviewed by me and considered in my medical decision making.  Assessment & Plan:  [name] [name] was seen today for hosptial follow up.  Diagnoses and all orders for this visit:  Hospital discharge follow-up Acute  ischemic stroke [facility]) Internal carotid artery stenosis, bilateral No recurrent symptoms since discharged home. Advised to restart Eliquis due to ICA stenosis. Aware to make follow up with vascular surgery. Will recheck labs today. Follow up with PCP as scheduled.  -     CBC with Differential/Platelet -     BMP8+EGFR -     apixaban (ELIQUIS) 5 MG TABS tablet; Take 1 tablet (5 mg total) by mouth 2 (two) times daily.     Continue all other maintenance medications.  Follow up plan: Return if symptoms worsen or fail to improve.   Continue healthy lifestyle choices, including diet (rich in fruits, vegetables, and lean proteins, and low in salt and simple carbohydrates) and exercise (at least 30 minutes of moderate physical activity daily).    The above assessment and management plan was discussed with the patient. The patient verbalized understanding of and has agreed to the management plan. Patient is aware to call the clinic if they develop any new symptoms or if symptoms persist or worsen. Patient is aware when to return to the clinic for a follow-up visit. Patient educated on when it is appropriate to go to the emergency department.   Monia Pouch, FNP-C [facility] [phone]

## 2022-06-03 NOTE — Patient Instructions (Signed)
Schedule an appointment with Nada Libman, MD (Vascular Surgery); Bilateral proximal internal carotid artery atherosclerosis resulting in 70% stenosis on the right Schedule an appointment with Larina Earthly, MD (Vascular Surgery) in 1 week (05/22/2022); Bilateral proximal internal carotid artery atherosclerosis resulting in 70% stenosis on the right

## 2022-06-04 ENCOUNTER — Ambulatory Visit (HOSPITAL_COMMUNITY)
Admission: RE | Admit: 2022-06-04 | Discharge: 2022-06-04 | Disposition: A | Discharge status: Home / Self Care | Payer: HMO | Source: Ambulatory Visit | Attending: Vascular Surgery | Admitting: Vascular Surgery

## 2022-06-04 ENCOUNTER — Other Ambulatory Visit: Payer: Self-pay

## 2022-06-04 ENCOUNTER — Ambulatory Visit: Payer: HMO | Admitting: Physician Assistant

## 2022-06-04 VITALS — BP 137/69 | HR 58 | Temp 97.7°F | Resp 20 | Ht 70.0 in | Wt 189.8 lb

## 2022-06-04 DIAGNOSIS — I6523 Occlusion and stenosis of bilateral carotid arteries: Secondary | ICD-10-CM | POA: Insufficient documentation

## 2022-06-04 LAB — CBC WITH DIFFERENTIAL/PLATELET
Basophils Absolute: 0 10*3/uL (ref 0.0–0.2)
Basos: 1 %
EOS (ABSOLUTE): 0.1 10*3/uL (ref 0.0–0.4)
Eos: 2 %
Hematocrit: 37.8 % (ref 37.5–51.0)
Hemoglobin: 12.6 g/dL — ABNORMAL LOW (ref 13.0–17.7)
Immature Grans (Abs): 0 10*3/uL (ref 0.0–0.1)
Immature Granulocytes: 0 %
Lymphocytes Absolute: 1.4 10*3/uL (ref 0.7–3.1)
Lymphs: 16 %
MCH: 31.9 pg (ref 26.6–33.0)
MCHC: 33.3 g/dL (ref 31.5–35.7)
MCV: 96 fL (ref 79–97)
Monocytes Absolute: 0.6 10*3/uL (ref 0.1–0.9)
Monocytes: 7 %
Neutrophils Absolute: 6.6 10*3/uL (ref 1.4–7.0)
Neutrophils: 74 %
Platelets: 211 10*3/uL (ref 150–450)
RBC: 3.95 x10E6/uL — ABNORMAL LOW (ref 4.14–5.80)
RDW: 12.4 % (ref 11.6–15.4)
WBC: 8.8 10*3/uL (ref 3.4–10.8)

## 2022-06-04 LAB — BMP8+EGFR
BUN/Creatinine Ratio: 22 (ref 10–24)
BUN: 34 mg/dL — ABNORMAL HIGH (ref 8–27)
CO2: 22 mmol/L (ref 20–29)
Calcium: 9.8 mg/dL (ref 8.6–10.2)
Chloride: 105 mmol/L (ref 96–106)
Creatinine, Ser: 1.56 mg/dL — ABNORMAL HIGH (ref 0.76–1.27)
Glucose: 129 mg/dL — ABNORMAL HIGH (ref 70–99)
Potassium: 4.3 mmol/L (ref 3.5–5.2)
Sodium: 143 mmol/L (ref 134–144)
eGFR: 49 mL/min/{1.73_m2} — ABNORMAL LOW (ref >59)

## 2022-06-04 NOTE — Progress Notes (Signed)
HISTORY AND PHYSICAL     CC:  follow up. Requesting Provider:  Dettinger, Fransisca Kaufmann, MD  HPI: This is a 66 y.o. male here for follow up for carotid artery stenosis.  A few years ago, he was noted to have a carotid bruit and had carotid duplex.  He has been followed for carotid artery stenosis yearly since then.    Pt was last seen 09/30/2021 and at that time he was doing well without stroke sx and had 40-59% bilateral ICA stenosis.    Pt returns today for follow up.    Pt denies any amaurosis fugax, weakness, numbness, paralysis or clumsiness or facial droop.  In mid November, he went to the ER for severe dizziness and slurred speech.   He did have a CTA head/neck, which revealed 70% stenosis of the right ICA and less than 50% of the left ICA.  There were no abnormalities on head CT or MRI.  The pt states he has never had any slurred speech in the past or since the event.  He has had some dizziness before but not like he experienced this day as he states he was walking for the door and the door was moving.  He has been compliant with his statin and asa.  Given the above, he was asked to follow up with Vascular Surgery.  He was on eliquis for afib.  His cardiologist called him Monday and stated he did not have any afib events on his cardiac monitor and was told he could discontinue his Eliquis.    He does have aneurysm of his ascending aorta that measured 3.9cm in 2021 and cardiology is following.  He has hx of AVR with pericardial valve in 2012 by Dr. Cyndia Bent.   The pt is on a statin for cholesterol management.  The pt is on a daily aspirin.   Other AC:  Eliquis-discontinued earlier this week.  The pt is on BB for hypertension.   The pt does  have diabetes Tobacco hx:  former  Pt does not have family hx of AAA.  Past Medical History:  Diagnosis Date   Aortic stenosis 8/09   Bicuspid aortic valve   Arthritis 6/10   hand    Atrial fibrillation (HCC)    post op only   Bile duct  calculus with acute cholecystitis    CAD (coronary artery disease) 8/09   Calculus of common bile duct with acute pancreatitis 11/2016   CTS (carpal tunnel syndrome)    bilateral    Diabetes ([place])    Diastolic dysfunction 1/32   Elevated LFTs    Fatty liver disease, nonalcoholic    History of kidney stones    Hyperlipidemia    Hypertension    Kidney stones    Nasal congestion    NIDDM (non-insulin dependent diabetes mellitus)    x15 yrs   OSA on CPAP 2006   Paresthesias in left hand 02/19/2004   Paresthesias in right hand 02/19/2004   Sinusitis    Vitamin D deficiency 06/13/09    Past Surgical History:  Procedure Laterality Date   bladder cancer - operation - 01/21/18 - Dr Tresa Perales  01/21/2018   CARPAL TUNNEL RELEASE     bilateral    CHOLECYSTECTOMY  11/2016   COLONOSCOPY N/A 09/22/2018   Procedure: COLONOSCOPY;  Surgeon: Rogene Houston, MD;  Location: [facility];  Service: Endoscopy;  Laterality: N/A;  1:00   CORONARY ANGIOPLASTY WITH STENT PLACEMENT     to rt  coronary atery (Dr. Degert-cardiologist)   [place], URETEROSCOPY AND STENT PLACEMENT Left 03/07/2018   Procedure: CYSTOSCOPY WITH RETROGRADE PYELOGRAM,  AND STENT PLACEMENT;  Surgeon: [name] Frock, MD;  Location: [facility];  Service: Urology;  Laterality: Left;   ERCP N/A 11/24/2016   Procedure: ENDOSCOPIC RETROGRADE CHOLANGIOPANCREATOGRAPHY (ERCP);  Surgeon: Rogene Houston, MD;  Location: [facility];  Service: Endoscopy;  Laterality: N/A;   EXTRACORPOREAL SHOCK WAVE LITHOTRIPSY Left 03/17/2018   Procedure: LEFT EXTRACORPOREAL SHOCK WAVE LITHOTRIPSY (ESWL);  Surgeon: Irine Seal, MD;  Location: [facility];  Service: Urology;  Laterality: Left;   REMOVAL OF STONES N/A 11/24/2016   Procedure: REMOVAL OF STONES;  Surgeon: Rogene Houston, MD;  [facility]: [facility];  Service: Endoscopy;  Laterality: N/A;   SPHINCTEROTOMY N/A 11/24/2016   Procedure: SPHINCTEROTOMY;  Surgeon: Rogene Houston, MD;   [facility]: [facility];  Service: Endoscopy;  Laterality: N/A;   TISSUE AORTIC VALVE REPLACEMENT     2012    Allergies  Allergen Reactions   Glyxambi [Empagliflozin-Linagliptin] Diarrhea and Itching    He was able to take Jardiance alone with no side effects so it was the Tradjenta part that most likely gave him the reaction   Hctz [Hydrochlorothiazide]     Nausea and headache    Sulfa Antibiotics Nausea And Vomiting   Sulfonamide Derivatives     Current Outpatient Medications  Medication Sig Dispense Refill   acetaminophen (TYLENOL) 325 MG tablet Take 2 tablets (650 mg total) by mouth every 6 (six) hours as needed for mild pain (or Fever >/= 101). 12 tablet 0   aspirin EC 81 MG tablet Take 1 tablet (81 mg total) by mouth daily. Swallow whole.     atenolol (TENORMIN) 25 MG tablet Take 1 tablet (25 mg total) by mouth daily. 90 tablet 3   atorvastatin (LIPITOR) 40 MG tablet Take 1 tablet (40 mg total) by mouth daily. 90 tablet 3   blood glucose meter kit and supplies KIT Dispense based on patient and insurance preference. Use up to four times daily as directed. (FOR ICD-9 250.00, 250.01). 1 each 0   cholecalciferol (VITAMIN D) 1000 UNITS tablet Take 2,000 Units by mouth 2 (two) times daily.     diltiazem (CARDIZEM) 30 MG tablet TAKE 1 TABLET BY MOUTH TWICE DAILY 60 tablet 6   glipiZIDE (GLUCOTROL) 5 MG tablet Take 2 tablets (10 mg total) by mouth 2 (two) times daily before a meal. 360 tablet 3   insulin degludec (TRESIBA FLEXTOUCH) 100 UNIT/ML FlexTouch Pen Inject 10 Units into the skin daily. (Patient taking differently: Inject 14 Units into the skin daily.) 15 mL 2   Insulin Pen Needle (GLOBAL EASE INJECT PEN NEEDLES) 31G X 5 MM MISC Use daily as directed Dx E11.69 100 each 3   Lancets (ONETOUCH DELICA PLUS [id number]) MISC 4 (four) times daily.     metFORMIN (GLUCOPHAGE) 1000 MG tablet Take 1 tablet (1,000 mg total) by mouth 2 (two) times daily with a meal. 180 tablet 3   ONETOUCH ULTRA  test strip Test BS 4 times daily Dx E11.69 400 each 3   Semaglutide (RYBELSUS) 14 MG TABS Take 1 tablet (14 mg total) by mouth daily. 90 tablet 1   temazepam (RESTORIL) 15 MG capsule TAKE ONE CAPSULE BY MOUTH AT BEDTIME AS NEEDED (Patient taking differently: Take 15 mg by mouth at bedtime as needed for sleep.) 30 capsule 1   apixaban (ELIQUIS) 5 MG TABS tablet Take 1  tablet (5 mg total) by mouth 2 (two) times daily. (Patient not taking: Reported on 06/04/2022) 60 tablet 1   No current facility-administered medications for this visit.    Family History  Problem Relation Age of Onset   Hearing loss Mother    Alzheimer's disease Mother    Hearing loss Father    Diabetes Father    Diabetes Other        Family History     Social History   Socioeconomic History   Marital status: Married    Spouse name: Coralyn Mark   Number of children: 3   Years of education: Not on file   Highest education level: High school graduate  Occupational History   Occupation: Retired  Tobacco Use   Smoking status: Former    Packs/day: 1.50    Years: 25.00    Total pack years: 37.50    Types: Cigarettes    Start date: 07/06/1974    Quit date: 07/06/2002    Years since quitting: 19.9    Passive exposure: Never   Smokeless tobacco: Never  Vaping Use   Vaping Use: Never used  Substance and Sexual Activity   Alcohol use: No    Alcohol/week: 0.0 standard drinks of alcohol   Drug use: No   Sexual activity: Not on file  Other Topics Concern   Not on file  Social History Narrative   Not on file   Social Determinants of Health   Financial Resource Strain: Low Risk  (12/02/2021)   Overall Financial Resource Strain (CARDIA)    Difficulty of Paying Living Expenses: Not hard at all  Food Insecurity: No Food Insecurity (12/02/2021)   Hunger Vital Sign    Worried About Running Out of Food in the Last Year: Never true    [place] in the Last Year: Never true  Transportation Needs: No Transportation Needs  (12/02/2021)   PRAPARE - Hydrologist (Medical): No    Lack of Transportation (Non-Medical): No  Physical Activity: Sufficiently Active (12/02/2021)   Exercise Vital Sign    Days of Exercise per Week: 7 days    Minutes of Exercise per Session: 50 min  Stress: No Stress Concern Present (12/02/2021)   [place]    Feeling of Stress : Not at all  Social Connections: [place] (12/02/2021)   Social Connection and Isolation Panel [NHANES]    Frequency of Communication with Friends and Family: More than three times a week    Frequency of Social Gatherings with Friends and Family: More than three times a week    Attends Religious Services: More than 4 times per year    Active Member of [employer] or Organizations: Yes    Attends Music therapist: More than 4 times per year    Marital Status: Married  Human resources officer Violence: Not At Risk (12/02/2021)   Humiliation, Afraid, Rape, and Kick questionnaire    Fear of Current or Ex-Partner: No    Emotionally Abused: No    Physically Abused: No    Sexually Abused: No     REVIEW OF SYSTEMS:   _0  denotes positive finding, _1  denotes negative finding Cardiac  Comments:  Chest pain or chest pressure:    Shortness of breath upon exertion:    Short of breath when lying flat:    Irregular heart rhythm:        Vascular    Pain in  calf, thigh, or hip brought on by ambulation:    Pain in feet at night that wakes you up from your sleep:     Blood clot in your veins:    Leg swelling:         Pulmonary    Oxygen at home:    Productive cough:     Wheezing:         Neurologic    Sudden weakness in arms or legs:     Sudden numbness in arms or legs:     Sudden onset of difficulty speaking or slurred speech: x   Temporary loss of vision in one eye:     Problems with dizziness:  x       Gastrointestinal    Blood in stool:     Vomited  blood:         Genitourinary    Burning when urinating:     Blood in urine:        Psychiatric    Major depression:         Hematologic    Bleeding problems:    Problems with blood clotting too easily:        Skin    Rashes or ulcers:        Constitutional    Fever or chills:      PHYSICAL EXAMINATION:  Today's Vitals   06/04/22 0935 06/04/22 0938  BP: 134/66 137/69  Pulse: (!) 58   Resp: 20   Temp: 97.7 F (36.5 C)   TempSrc: Temporal   SpO2: 96%   Weight: 189 lb 12.8 oz (86.1 kg)   Height: _0  (1.778 m)    Body mass index is 27.23 kg/m.   General:  WDWN in NAD; vital signs documented above Gait: Not observed HENT: WNL, normocephalic Pulmonary: normal non-labored breathing Cardiac: regular HR, without carotid bruits Abdomen: soft, NT; aortic pulse is not palpable Skin: without rashes Vascular Exam/Pulses:  Right Left  Radial 2+ (normal) 2+ (normal)  DP 2+ (normal) Unable to palpate  PT 2+ (normal) 2+ (normal)   Extremities: without open wounds Musculoskeletal: no muscle wasting or atrophy  Neurologic: A&O X 3; moving all extremities equally; speech is fluent/normal Psychiatric:  The pt has Normal affect.   Non-Invasive Vascular Imaging:   Carotid Duplex on 06/04/2022 Right:  60-79% ICA stenosis Left:  40-59% ICA stenosis Vertebrals:  Bilateral vertebral arteries demonstrate antegrade flow.  Subclavians: Normal flow hemodynamics were seen in bilateral subclavian arteries   Previous Carotid duplex on 09/30/2021: Right: 40-59% ICA stenosis Left:   40-59% ICA stenosis    ASSESSMENT/PLAN:: 66 y.o. male here for follow up carotid artery stenosis.  He did have recent episode of severe dizziness and slurred speech.   -duplex today reveals 60-79% right ICA stenosis and 40-59% left ICA stenosis.  I discussed recent events with Dr. Donzetta Matters who reviewed CT scan and duplex.  Given he did not have any evidence of abnormalities in the brain, would consider the  ICA stenosis asymptomatic and will continue to keep an eye on this.   -do not feel that his episode of dizziness was due to his carotid stenosis. -discussed s/s of stroke with pt and he understands should he develop any of these sx, he will go to the nearest ER or call 911. -pt will f/u in 6 months with carotid duplex -pt will call sooner should he have any issues. -continue statin/asa    Leontine Locket, [facility]  [place]: Donzetta Matters

## 2022-06-05 IMAGING — CT CT HEAD W/O CM
3 series · 15 of 47 positions shown, 18 images · non-contrast
Comparison: Report of [HOSPITAL] Kiri Jim CT 09/08/2010
(no images available).

CLINICAL DATA: 65-year-old male status post fall. Hypotensive,
shortness of breath.

EXAM:
CT HEAD WITHOUT CONTRAST
TECHNIQUE: Contiguous axial images were obtained from the base of the skull
through the vertex without intravenous contrast.

[Series 2: head w o · axial · 0.48mm/px · z∈[-35,+105]mm · 9 of 34 slices shown, 12 images]
[im 3/34  brain]
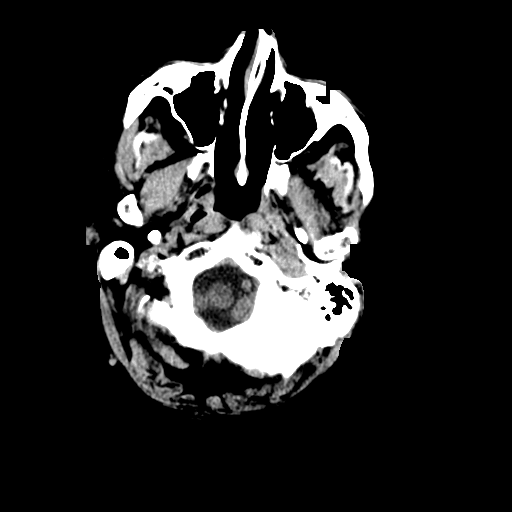
[im 3/34  bone]
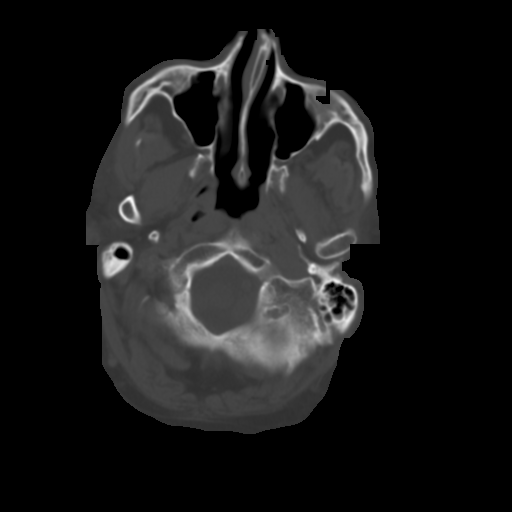
[im 6/34  brain]
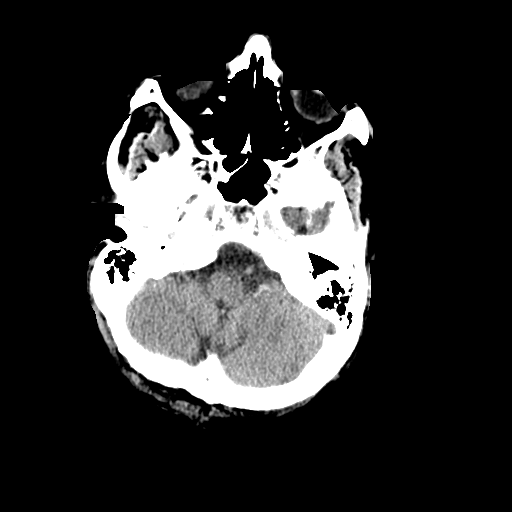
[im 10/34  brain]
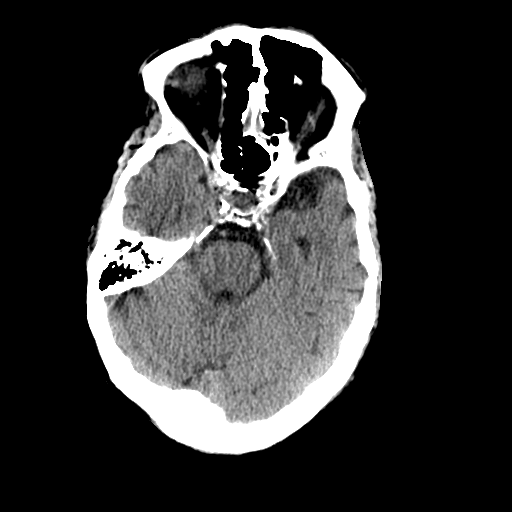
[im 13/34  brain]
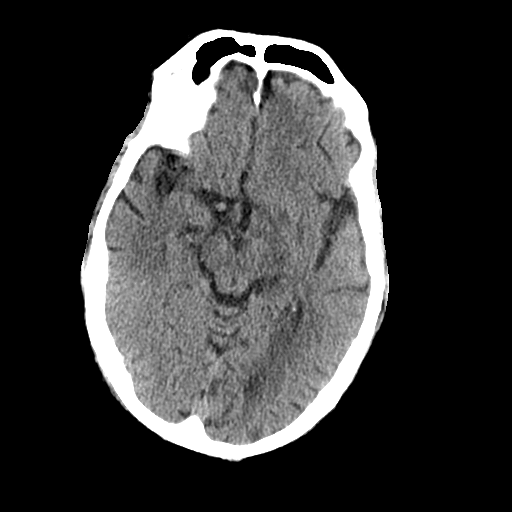
[im 18/34  brain]
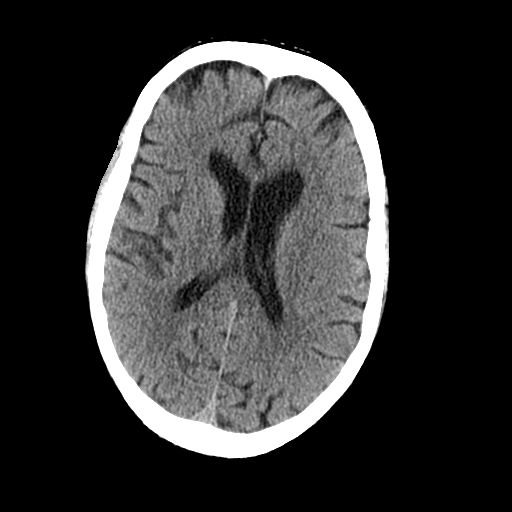
[im 18/34  bone]
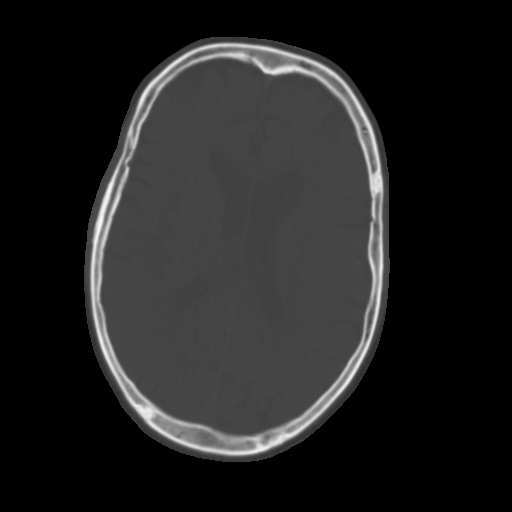
[im 21/34  brain]
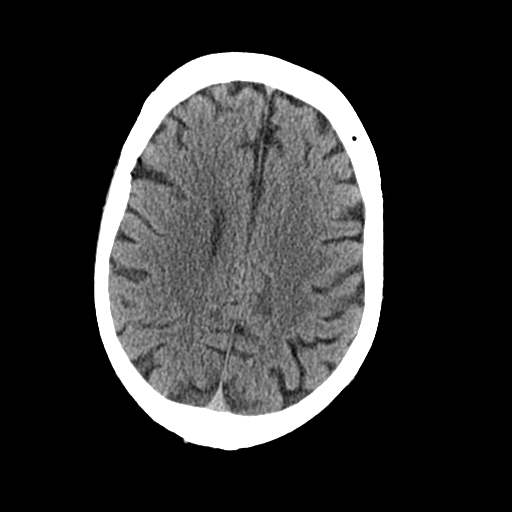
[im 24/34  brain]
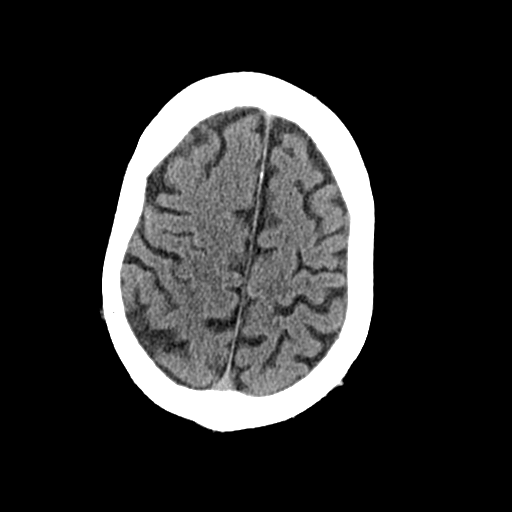
[im 28/34  brain]
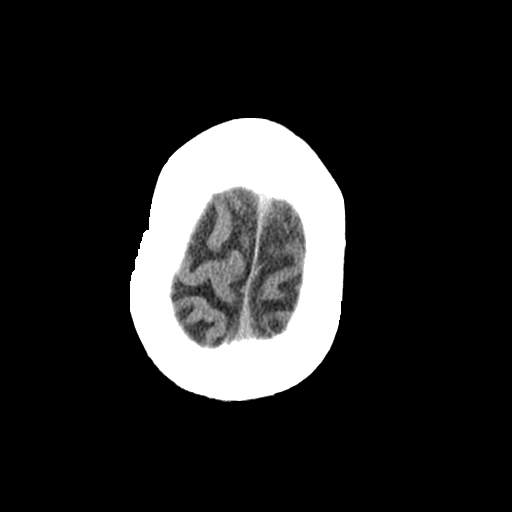
[im 31/34  brain]
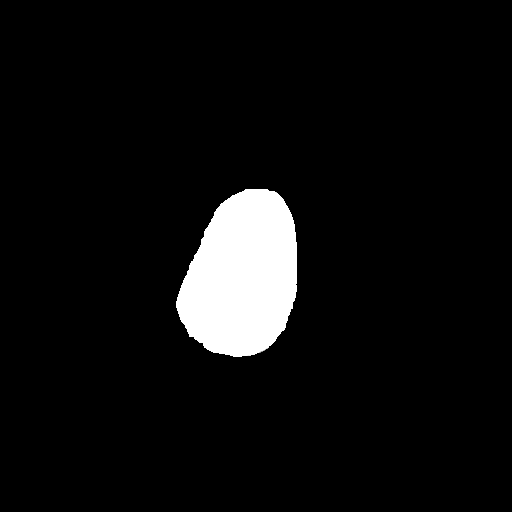
[im 31/34  bone]
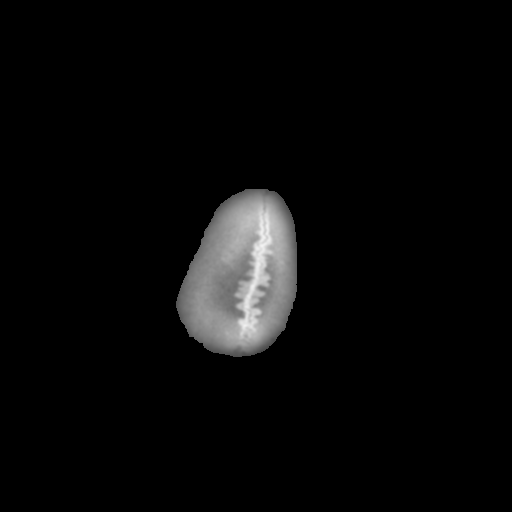

[Series 4: coronal soft · coronal · 0.34mm/px · 3 of 84 slices shown]
[im 28/84  brain]
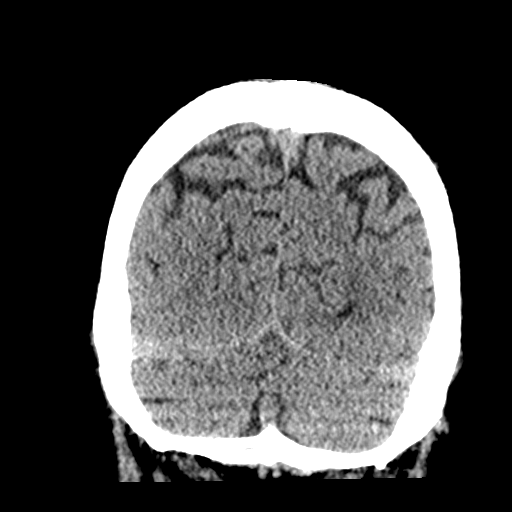
[im 37/84  brain]
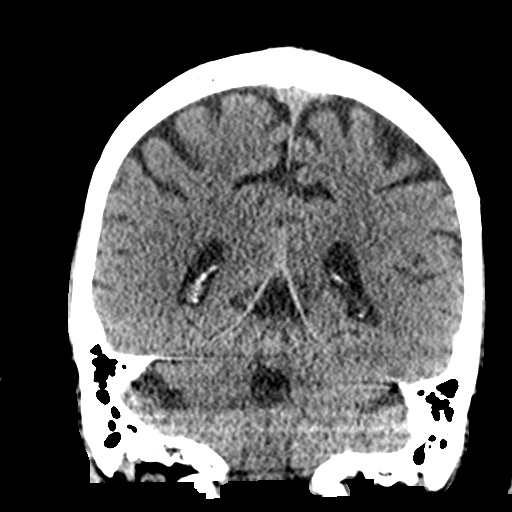
[im 47/84  brain]
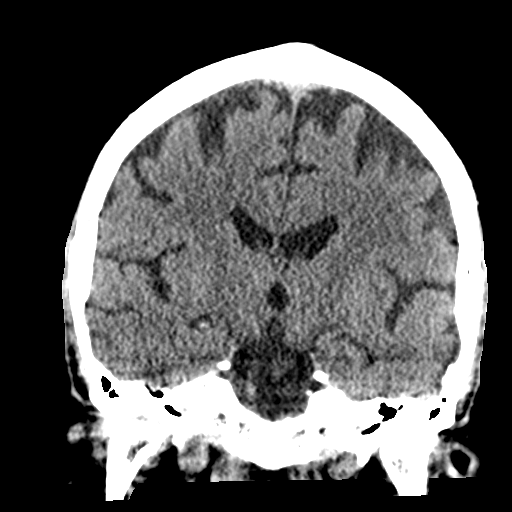

[Series 5: sagittal soft · sagittal · 0.38mm/px · 3 of 66 slices shown]
[im 22/66  brain]
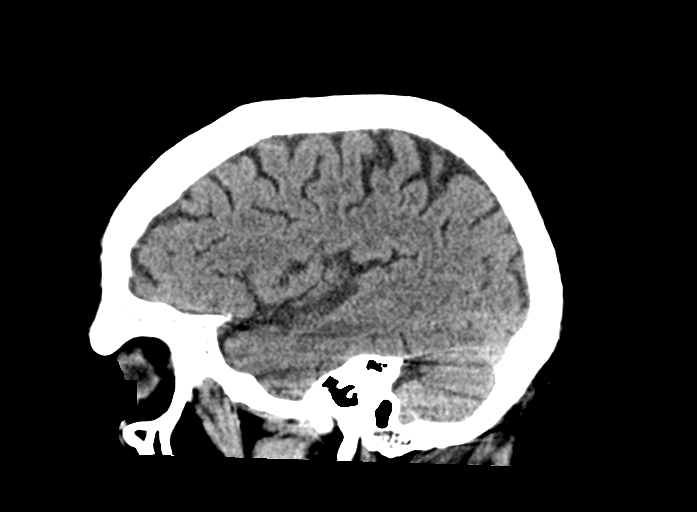
[im 33/66  brain]
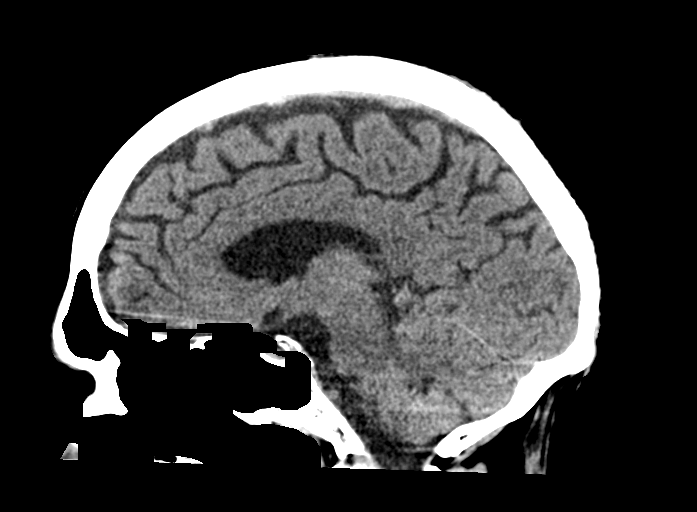
[im 44/66  brain]
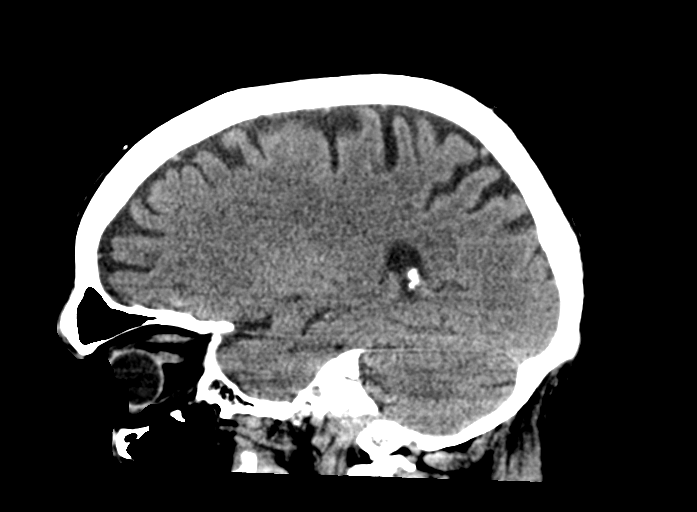

[15 of 47 positions shown; findings below may reference images not displayed]

FINDINGS: Brain: Cerebral volume is within normal limits for age. No midline
shift, ventriculomegaly, mass effect, evidence of mass lesion,
intracranial hemorrhage or evidence of cortically based acute
infarction. Gray-white matter differentiation is within normal
limits throughout the brain. No encephalomalacia identified.

Vascular: Calcified atherosclerosis at the skull base. Dominant left
vertebral artery. No suspicious intracranial vascular hyperdensity.

Skull: No acute osseous abnormality identified.  Some osteopenia.

Sinuses/Orbits: Trace fluid in the mastoid air cells, but tympanic
cavities remain clear. Negative visible nasopharynx. Paranasal
sinuses are clear.

Other: Visualized orbit soft tissues are within normal limits. No
definite scalp soft tissue injury.
IMPRESSION: 1. No acute traumatic injury identified. Normal for age non contrast
CT appearance of the brain.
2. Trace bilateral mastoid effusions, probably post inflammatory and
significance doubtful.

## 2022-06-05 IMAGING — DX DG CHEST 1V PORT
1 series · 1 of 1 positions shown · non-contrast
Comparison: Chest x-ray 07/04/2021.

CLINICAL DATA: 65-year-old male with history of atrial
fibrillation. Increasing shortness of breath. Hypotension.

EXAM:
PORTABLE CHEST 1 VIEW

[chest ap]
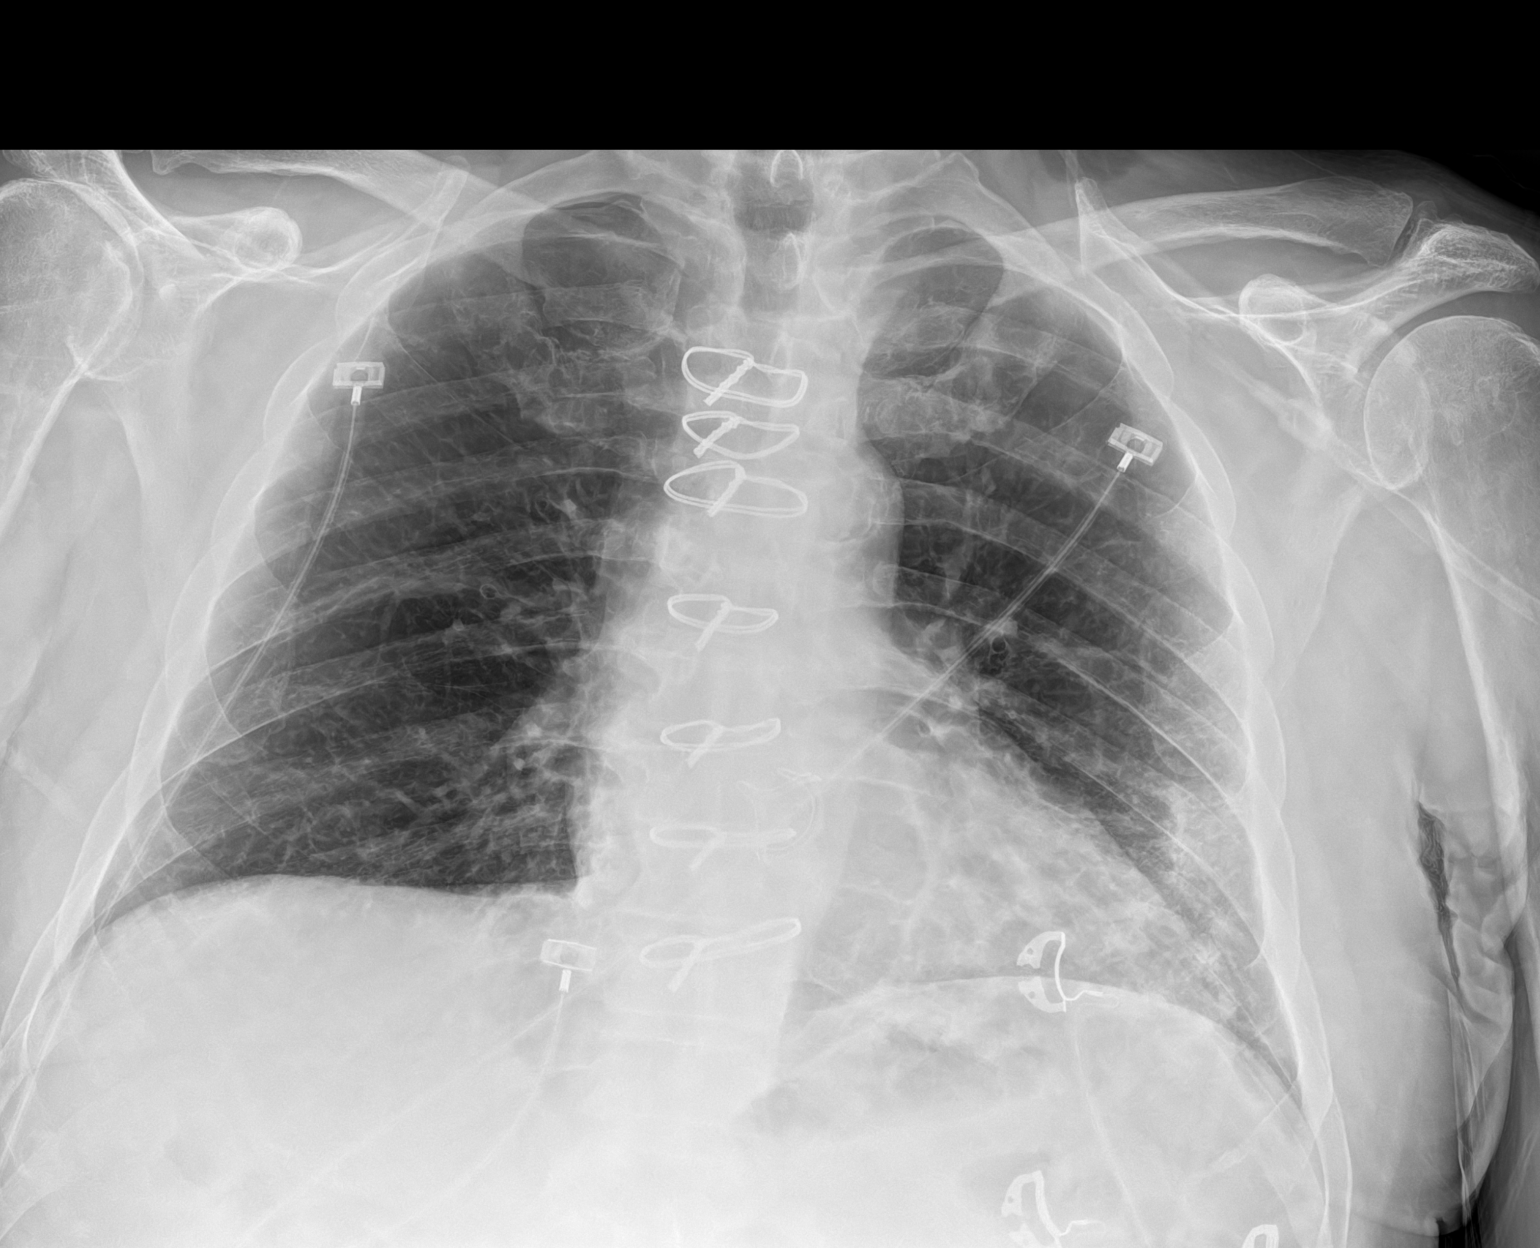

[1 of 1 positions shown; findings below may reference images not displayed]

FINDINGS: Multifocal interstitial and poorly defined airspace opacities are
again noted in the left mid to lower lung. Right lung appears clear.
No pneumothorax. No pleural effusions. No evidence of pulmonary
edema. Heart size is normal. Upper mediastinal contours are within
normal limits. Atherosclerotic calcifications are noted in the
thoracic aorta. Status post median sternotomy for aortic valve
replacement with what appears to be a stented bioprosthesis.
IMPRESSION: 1. Persistent bronchopneumonia in the left mid to lower lung,
similar to the prior study.
2. Aortic atherosclerosis.
3. Status post aortic valve replacement.

## 2022-06-08 ENCOUNTER — Other Ambulatory Visit (HOSPITAL_COMMUNITY): Payer: Self-pay | Admitting: Sports Medicine

## 2022-06-08 DIAGNOSIS — M545 Low back pain, unspecified: Secondary | ICD-10-CM

## 2022-06-10 ENCOUNTER — Other Ambulatory Visit: Payer: Self-pay

## 2022-06-10 DIAGNOSIS — I6523 Occlusion and stenosis of bilateral carotid arteries: Secondary | ICD-10-CM

## 2022-06-26 ENCOUNTER — Ambulatory Visit (HOSPITAL_COMMUNITY)
Admission: RE | Admit: 2022-06-26 | Discharge: 2022-06-26 | Disposition: A | Discharge status: Home / Self Care | Payer: PPO | Source: Ambulatory Visit | Attending: Sports Medicine | Admitting: Sports Medicine

## 2022-06-26 DIAGNOSIS — M545 Low back pain, unspecified: Secondary | ICD-10-CM | POA: Insufficient documentation

## 2022-07-01 ENCOUNTER — Telehealth (INDEPENDENT_AMBULATORY_CARE_PROVIDER_SITE_OTHER): Payer: HMO | Admitting: Family Medicine

## 2022-07-01 ENCOUNTER — Encounter: Payer: Self-pay | Admitting: Family Medicine

## 2022-07-01 DIAGNOSIS — U071 COVID-19: Secondary | ICD-10-CM

## 2022-07-01 MED ORDER — MOLNUPIRAVIR EUA 200MG CAPSULE: 4.0000 | ORAL_CAPSULE | Freq: Two times a day (BID) | ORAL | 0 refills | Status: AC

## 2022-07-01 NOTE — Progress Notes (Signed)
Virtual Visit via MyChart Video Note Due to COVID-19 pandemic this visit was conducted virtually. This visit type was conducted due to national recommendations for restrictions regarding the COVID-19 Pandemic (e.g. social distancing, sheltering in place) in an effort to limit this patient's exposure and mitigate transmission in our community. All issues noted in this document were discussed and addressed.  A physical exam was not performed with this format.   I connected with [name] [name] on 07/01/2022 at 1100 by MyChart Video and verified that I am speaking with the correct person using two identifiers. [name] [name] is currently located at home and patient is currently with them during visit. The provider, Monia Pouch, FNP is located in their office at time of visit.  I discussed the limitations, risks, security and privacy concerns of performing an evaluation and management service by virtual visit and the availability of in person appointments. I also discussed with the patient that there may be a patient responsible charge related to this service. The patient expressed understanding and agreed to proceed.  Subjective:  Patient ID: [name] [name], male    DOB: 04-Jan-1956, 66 y.o.   MRN: [id number]  Chief Complaint:  Covid Positive   HPI: [name] [name] is a 66 y.o. male presenting on 07/01/2022 for Covid Positive   Pt reports three days of cough, congestion, diarrhea, rhinorrhea, and fever. Tested positive for COVID this morning. Has been taking tylenol and NyQuil with some relief of symptoms. Denies shortness of breath, chest pain, weakness, confusion, or decreased urine output. No longer on Eliquis, cardiology discontinued this.      Relevant past medical, surgical, family, and social history reviewed and updated as indicated.  Allergies and medications reviewed and updated.   Past Medical History:  Diagnosis Date   Aortic stenosis 8/09   Bicuspid aortic valve   Arthritis  6/10   hand    Atrial fibrillation (HCC)    post op only   Bile duct calculus with acute cholecystitis    CAD (coronary artery disease) 8/09   Calculus of common bile duct with acute pancreatitis 11/2016   CTS (carpal tunnel syndrome)    bilateral    Diabetes ([place])    Diastolic dysfunction 5/95   Elevated LFTs    Fatty liver disease, nonalcoholic    History of kidney stones    Hyperlipidemia    Hypertension    Kidney stones    Nasal congestion    NIDDM (non-insulin dependent diabetes mellitus)    x15 yrs   OSA on CPAP 2006   Paresthesias in left hand 02/19/2004   Paresthesias in right hand 02/19/2004   Sinusitis    Vitamin D deficiency 06/13/09    Past Surgical History:  Procedure Laterality Date   bladder cancer - operation - 01/21/18 - Dr Tresa Rohman  01/21/2018   CARPAL TUNNEL RELEASE     bilateral    CHOLECYSTECTOMY  11/2016   COLONOSCOPY N/A 09/22/2018   Procedure: COLONOSCOPY;  Surgeon: Rogene Houston, MD;  [facility]: [facility];  Service: Endoscopy;  Laterality: N/A;  1:00   CORONARY ANGIOPLASTY WITH STENT PLACEMENT     to rt coronary atery (Dr. Degert-cardiologist)   [place], URETEROSCOPY AND STENT PLACEMENT Left 03/07/2018   Procedure: CYSTOSCOPY WITH RETROGRADE PYELOGRAM,  AND STENT PLACEMENT;  Surgeon: [name] Frock, MD;  Location: [facility];  Service: Urology;  Laterality: Left;   ERCP N/A 11/24/2016   Procedure: ENDOSCOPIC RETROGRADE CHOLANGIOPANCREATOGRAPHY (ERCP);  Surgeon:  Rogene Houston, MD;  [facility]: [facility];  Service: Endoscopy;  Laterality: N/A;   EXTRACORPOREAL SHOCK WAVE LITHOTRIPSY Left 03/17/2018   Procedure: LEFT EXTRACORPOREAL SHOCK WAVE LITHOTRIPSY (ESWL);  Surgeon: Irine Seal, MD;  Location: [facility];  Service: Urology;  Laterality: Left;   REMOVAL OF STONES N/A 11/24/2016   Procedure: REMOVAL OF STONES;  Surgeon: Rogene Houston, MD;  [facility];  Service: Endoscopy;  Laterality: N/A;   SPHINCTEROTOMY N/A  11/24/2016   Procedure: SPHINCTEROTOMY;  Surgeon: Rogene Houston, MD;  [facility]: [facility];  Service: Endoscopy;  Laterality: N/A;   TISSUE AORTIC VALVE REPLACEMENT     2012    Social History   Socioeconomic History   Marital status: Married    Spouse name: Coralyn Mark   Number of children: 3   Years of education: Not on file   Highest education level: High school graduate  Occupational History   Occupation: Retired  Tobacco Use   Smoking status: Former    Packs/day: 1.50    Years: 25.00    Total pack years: 37.50    Types: Cigarettes    Start date: 07/06/1974    Quit date: 07/06/2002    Years since quitting: 20.0    Passive exposure: Never   Smokeless tobacco: Never  Vaping Use   Vaping Use: Never used  Substance and Sexual Activity   Alcohol use: No    Alcohol/week: 0.0 standard drinks of alcohol   Drug use: No   Sexual activity: Not on file  Other Topics Concern   Not on file  Social History Narrative   Not on file   Social Determinants of Health   Financial Resource Strain: Low Risk  (12/02/2021)   Overall Financial Resource Strain (CARDIA)    Difficulty of Paying Living Expenses: Not hard at all  Food Insecurity: No Food Insecurity (12/02/2021)   Hunger Vital Sign    Worried About Running Out of Food in the Last Year: Never true    [place] in the Last Year: Never true  Transportation Needs: No Transportation Needs (12/02/2021)   PRAPARE - Hydrologist (Medical): No    Lack of Transportation (Non-Medical): No  Physical Activity: Sufficiently Active (12/02/2021)   Exercise Vital Sign    Days of Exercise per Week: 7 days    Minutes of Exercise per Session: 50 min  Stress: No Stress Concern Present (12/02/2021)   [place]    Feeling of Stress : Not at all  Social Connections: [place] (12/02/2021)   Social Connection and Isolation Panel [NHANES]     Frequency of Communication with Friends and Family: More than three times a week    Frequency of Social Gatherings with Friends and Family: More than three times a week    Attends Religious Services: More than 4 times per year    Active Member of [employer] or Organizations: Yes    Attends Music therapist: More than 4 times per year    Marital Status: Married  Human resources officer Violence: Not At Risk (12/02/2021)   Humiliation, Afraid, Rape, and Kick questionnaire    Fear of Current or Ex-Partner: No    Emotionally Abused: No    Physically Abused: No    Sexually Abused: No    Outpatient Encounter Medications as of 07/01/2022  Medication Sig   molnupiravir EUA (LAGEVRIO) 200 mg CAPS capsule Take 4 capsules (  800 mg total) by mouth 2 (two) times daily for 5 days.   acetaminophen (TYLENOL) 325 MG tablet Take 2 tablets (650 mg total) by mouth every 6 (six) hours as needed for mild pain (or Fever >/= 101).   aspirin EC 81 MG tablet Take 1 tablet (81 mg total) by mouth daily. Swallow whole.   atenolol (TENORMIN) 25 MG tablet Take 1 tablet (25 mg total) by mouth daily.   atorvastatin (LIPITOR) 40 MG tablet Take 1 tablet (40 mg total) by mouth daily.   blood glucose meter kit and supplies KIT Dispense based on patient and insurance preference. Use up to four times daily as directed. (FOR ICD-9 250.00, 250.01).   cholecalciferol (VITAMIN D) 1000 UNITS tablet Take 2,000 Units by mouth 2 (two) times daily.   diltiazem (CARDIZEM) 30 MG tablet TAKE 1 TABLET BY MOUTH TWICE DAILY   glipiZIDE (GLUCOTROL) 5 MG tablet Take 2 tablets (10 mg total) by mouth 2 (two) times daily before a meal.   insulin degludec (TRESIBA FLEXTOUCH) 100 UNIT/ML FlexTouch Pen Inject 10 Units into the skin daily. (Patient taking differently: Inject 14 Units into the skin daily.)   Insulin Pen Needle (GLOBAL EASE INJECT PEN NEEDLES) 31G X 5 MM MISC Use daily as directed Dx E11.69   Lancets (ONETOUCH DELICA PLUS [id number]) MISC  4 (four) times daily.   metFORMIN (GLUCOPHAGE) 1000 MG tablet Take 1 tablet (1,000 mg total) by mouth 2 (two) times daily with a meal.   ONETOUCH ULTRA test strip Test BS 4 times daily Dx E11.69   Semaglutide (RYBELSUS) 14 MG TABS Take 1 tablet (14 mg total) by mouth daily.   temazepam (RESTORIL) 15 MG capsule TAKE ONE CAPSULE BY MOUTH AT BEDTIME AS NEEDED (Patient taking differently: Take 15 mg by mouth at bedtime as needed for sleep.)   [DISCONTINUED] apixaban (ELIQUIS) 5 MG TABS tablet Take 1 tablet (5 mg total) by mouth 2 (two) times daily. (Patient not taking: Reported on 06/04/2022)   No facility-administered encounter medications on file as of 07/01/2022.    Allergies  Allergen Reactions   Glyxambi [Empagliflozin-Linagliptin] Diarrhea and Itching    He was able to take Jardiance alone with no side effects so it was the Tradjenta part that most likely gave him the reaction   Hctz [Hydrochlorothiazide]     Nausea and headache    Sulfa Antibiotics Nausea And Vomiting   Sulfonamide Derivatives     Review of Systems  Constitutional:  Positive for activity change, appetite change, chills and fever. Negative for diaphoresis, fatigue and unexpected weight change.  HENT:  Positive for congestion, postnasal drip, rhinorrhea, sore throat and voice change. Negative for dental problem, drooling, ear discharge, ear pain, facial swelling, hearing loss, mouth sores, nosebleeds, sinus pressure, sinus pain, sneezing, tinnitus and trouble swallowing.   Eyes: Negative.  Negative for photophobia and visual disturbance.  Respiratory:  Positive for cough. Negative for apnea, choking, chest tightness, shortness of breath, wheezing and stridor.   Cardiovascular:  Negative for chest pain, palpitations and leg swelling.  Gastrointestinal:  Positive for diarrhea. Negative for abdominal distention, abdominal pain, anal bleeding, blood in stool, constipation, nausea, rectal pain and vomiting.  Endocrine:  Negative.   Genitourinary:  Negative for difficulty urinating, dysuria, frequency and urgency.  Musculoskeletal:  Negative for arthralgias and myalgias.  Skin: Negative.   Allergic/Immunologic: Negative.   Neurological:  Negative for dizziness, tremors, seizures, syncope, facial asymmetry, speech difficulty, weakness, light-headedness, numbness and headaches.  Hematological: Negative.  Psychiatric/Behavioral:  Negative for confusion, hallucinations, sleep disturbance and suicidal ideas.   All other systems reviewed and are negative.        Observations/Objective: No vital signs or physical exam, this was a virtual health encounter.  Pt alert and oriented, answers all questions appropriately, and able to speak in full sentences.  Nasal congestion and dry cough noted.    Assessment and Plan: Maliki was seen today for covid positive.  Diagnoses and all orders for this visit:  Positive self-administered antigen test for COVID-19 COVID positive at home. On day 3 of symptoms. No longer on Eliquis but does have altered renal function. Will treat with below due to age and comorbidities. Continue symptomatic management at home. Report new, worsening, or persistent symptoms.  -     molnupiravir EUA (LAGEVRIO) 200 mg CAPS capsule; Take 4 capsules (800 mg total) by mouth 2 (two) times daily for 5 days.     Follow Up Instructions: Return if symptoms worsen or fail to improve.    I discussed the assessment and treatment plan with the patient. The patient was provided an opportunity to ask questions and all were answered. The patient agreed with the plan and demonstrated an understanding of the instructions.   The patient was advised to call back or seek an in-person evaluation if the symptoms worsen or if the condition fails to improve as anticipated.  The above assessment and management plan was discussed with the patient. The patient verbalized understanding of and has agreed to the  management plan. Patient is aware to call the clinic if they develop any new symptoms or if symptoms persist or worsen. Patient is aware when to return to the clinic for a follow-up visit. Patient educated on when it is appropriate to go to the emergency department.    I provided 15 minutes of time during this MyChart Video encounter.   Monia Pouch, FNP-C [facility] [address] [place], [place] [address] [phone] 07/01/2022

## 2022-07-13 DIAGNOSIS — M5126 Other intervertebral disc displacement, lumbar region: Secondary | ICD-10-CM | POA: Diagnosis not present

## 2022-07-24 DIAGNOSIS — M48062 Spinal stenosis, lumbar region with neurogenic claudication: Secondary | ICD-10-CM | POA: Diagnosis not present

## 2022-07-31 ENCOUNTER — Other Ambulatory Visit (HOSPITAL_COMMUNITY): Payer: Self-pay

## 2022-08-11 DIAGNOSIS — M48062 Spinal stenosis, lumbar region with neurogenic claudication: Secondary | ICD-10-CM | POA: Diagnosis not present

## 2022-08-13 DIAGNOSIS — H40051 Ocular hypertension, right eye: Secondary | ICD-10-CM | POA: Diagnosis not present

## 2022-08-13 LAB — HM DIABETES EYE EXAM

## 2022-08-14 ENCOUNTER — Ambulatory Visit (INDEPENDENT_AMBULATORY_CARE_PROVIDER_SITE_OTHER): Payer: PPO | Admitting: Family Medicine

## 2022-08-14 ENCOUNTER — Encounter: Payer: Self-pay | Admitting: Family Medicine

## 2022-08-14 VITALS — BP 177/69 | HR 44 | Ht 70.0 in | Wt 190.0 lb

## 2022-08-14 DIAGNOSIS — G4709 Other insomnia: Secondary | ICD-10-CM

## 2022-08-14 DIAGNOSIS — I10 Essential (primary) hypertension: Secondary | ICD-10-CM | POA: Diagnosis not present

## 2022-08-14 DIAGNOSIS — I2510 Atherosclerotic heart disease of native coronary artery without angina pectoris: Secondary | ICD-10-CM | POA: Diagnosis not present

## 2022-08-14 DIAGNOSIS — E785 Hyperlipidemia, unspecified: Secondary | ICD-10-CM

## 2022-08-14 DIAGNOSIS — E1169 Type 2 diabetes mellitus with other specified complication: Secondary | ICD-10-CM | POA: Insufficient documentation

## 2022-08-14 LAB — BAYER DCA HB A1C WAIVED: HB A1C (BAYER DCA - WAIVED): 7.4 % — ABNORMAL HIGH (ref 4.8–5.6)

## 2022-08-14 NOTE — Progress Notes (Signed)
BP (!) 177/69   Pulse (!) 44   Ht 5' 10"$  (1.778 m)   Wt 190 lb (86.2 kg)   SpO2 98%   BMI 27.26 kg/m    Subjective:   Patient ID: [name] [name], male    DOB: 02-22-1956, 67 y.o.   MRN: [id number]  HPI: [name] [name] is a 67 y.o. male presenting on 08/14/2022 for Medical Management of Chronic Issues, Hyperlipidemia, and Diabetes   HPI Type 2 diabetes mellitus Patient comes in today for recheck of his diabetes. Patient has been currently taking metformin and glipizide and Tresiba and Rybelsus. Patient is not currently on an ACE inhibitor/ARB. Patient has not seen an ophthalmologist this year. Patient denies any issues with their feet. The symptom started onset as an adult hypertension and hyperlipidemia and CAD and A-fib ARE RELATED TO DM   Hypertension Patient is currently on atenolol diltiazem, and their blood pressure today is 177/69. Patient denies any lightheadedness or dizziness. Patient denies headaches, blurred vision, chest pains, shortness of breath, or weakness. Denies any side effects from medication and is content with current medication.   Hyperlipidemia Patient is coming in for recheck of his hyperlipidemia. The patient is currently taking atorvastatin. They deny any issues with myalgias or history of liver damage from it. They deny any focal numbness or weakness or chest pain.   Relevant past medical, surgical, family and social history reviewed and updated as indicated. Interim medical history since our last visit reviewed. Allergies and medications reviewed and updated.  Review of Systems  Constitutional:  Negative for chills and fever.  Eyes:  Negative for visual disturbance.  Respiratory:  Negative for shortness of breath and wheezing.   Cardiovascular:  Negative for chest pain and leg swelling.  Musculoskeletal:  Positive for arthralgias. Negative for back pain and gait problem.  Skin:  Negative for rash.  Neurological:  Negative for dizziness, weakness and  light-headedness.  All other systems reviewed and are negative.   Per HPI unless specifically indicated above   Allergies as of 08/14/2022       Reactions   Glyxambi [empagliflozin-linagliptin] Diarrhea, Itching   He was able to take Jardiance alone with no side effects so it was the Tradjenta part that most likely gave him the reaction   Hctz [hydrochlorothiazide]    Nausea and headache    Sulfa Antibiotics Nausea And Vomiting   Sulfonamide Derivatives         Medication List        Accurate as of August 14, 2022 11:00 AM. If you have any questions, ask your nurse or doctor.          acetaminophen 325 MG tablet Commonly known as: TYLENOL Take 2 tablets (650 mg total) by mouth every 6 (six) hours as needed for mild pain (or Fever >/= 101).   aspirin EC 81 MG tablet Take 1 tablet (81 mg total) by mouth daily. Swallow whole.   atenolol 25 MG tablet Commonly known as: TENORMIN Take 1 tablet (25 mg total) by mouth daily.   atorvastatin 40 MG tablet Commonly known as: LIPITOR Take 1 tablet (40 mg total) by mouth daily.   blood glucose meter kit and supplies Kit Dispense based on patient and insurance preference. Use up to four times daily as directed. (FOR ICD-9 250.00, 250.01).   cholecalciferol 1000 units tablet Commonly known as: VITAMIN D Take 2,000 Units by mouth 2 (two) times daily.   diltiazem 30 MG tablet Commonly known as:  CARDIZEM TAKE 1 TABLET BY MOUTH TWICE DAILY   glipiZIDE 5 MG tablet Commonly known as: GLUCOTROL Take 2 tablets (10 mg total) by mouth 2 (two) times daily before a meal.   Global Ease Inject Pen Needles 31G X 5 MM Misc Generic drug: Insulin Pen Needle Use daily as directed Dx E11.69   metFORMIN 1000 MG tablet Commonly known as: GLUCOPHAGE Take 1 tablet (1,000 mg total) by mouth 2 (two) times daily with a meal.   OneTouch Delica Plus 0000000 Misc 4 (four) times daily.   OneTouch Ultra test strip Generic drug: glucose  blood Test BS 4 times daily Dx E11.69   Rybelsus 14 MG Tabs Generic drug: Semaglutide Take 1 tablet (14 mg total) by mouth daily.   temazepam 15 MG capsule Commonly known as: RESTORIL TAKE ONE CAPSULE BY MOUTH AT BEDTIME AS NEEDED What changed:  how much to take how to take this when to take this reasons to take this additional instructions   Tresiba FlexTouch 100 UNIT/ML FlexTouch Pen Generic drug: insulin degludec Inject 10 Units into the skin daily. What changed: how much to take         Objective:   BP (!) 177/69   Pulse (!) 44   Ht 5' 10"$  (1.778 m)   Wt 190 lb (86.2 kg)   SpO2 98%   BMI 27.26 kg/m   Wt Readings from Last 3 Encounters:  08/14/22 190 lb (86.2 kg)  06/04/22 189 lb 12.8 oz (86.1 kg)  06/03/22 191 lb (86.6 kg)    Physical Exam Vitals and nursing note reviewed.  Constitutional:      General: He is not in acute distress.    Appearance: He is well-developed. He is not diaphoretic.  Eyes:     General: No scleral icterus.    Conjunctiva/sclera: Conjunctivae normal.  Neck:     Thyroid: No thyromegaly.  Cardiovascular:     Rate and Rhythm: Normal rate and regular rhythm.     Heart sounds: Normal heart sounds. No murmur heard. Pulmonary:     Effort: Pulmonary effort is normal. No respiratory distress.     Breath sounds: Normal breath sounds. No wheezing.  Musculoskeletal:        General: No swelling. Normal range of motion.     Cervical back: Neck supple.  Lymphadenopathy:     Cervical: No cervical adenopathy.  Skin:    General: Skin is warm and dry.     Findings: No rash.  Neurological:     Mental Status: He is alert and oriented to person, place, and time.     Coordination: Coordination normal.  Psychiatric:        Behavior: Behavior normal.       Assessment & Plan:   Problem List Items Addressed This Visit       Cardiovascular and Mediastinum   Essential hypertension   CAD (coronary artery disease)     Endocrine   Type 2  diabetes mellitus (HCC)   Type 2 diabetes mellitus with hyperlipidemia ([place]) - Primary   Relevant Orders   Bayer DCA Hb A1c Waived     Other   Hyperlipidemia with target LDL less than 70    A1c is 7.4, blood pressure up today but he just had a steroid injection a day and a half ago and likely is affecting both.  He also had another steroid injection a few weeks ago and its likely affecting his A1c as well.  Mainly focus on diet and  wanted to monitor closely  Blood pressure over the next couple weeks. Follow up plan: Return in about 3 months (around 11/12/2022), or if symptoms worsen or fail to improve, for Diabetes recheck.  Counseling provided for all of the vaccine components Orders Placed This Encounter  Procedures   Bayer [place] Hb A1c [place] Athalee Esterline, MD [facility] 08/14/2022, 11:00 AM

## 2022-08-24 ENCOUNTER — Other Ambulatory Visit (HOSPITAL_COMMUNITY): Payer: Self-pay | Admitting: Sports Medicine

## 2022-08-24 DIAGNOSIS — M160 Bilateral primary osteoarthritis of hip: Secondary | ICD-10-CM | POA: Diagnosis not present

## 2022-08-24 DIAGNOSIS — M25552 Pain in left hip: Secondary | ICD-10-CM

## 2022-08-24 DIAGNOSIS — M5126 Other intervertebral disc displacement, lumbar region: Secondary | ICD-10-CM | POA: Diagnosis not present

## 2022-08-25 ENCOUNTER — Ambulatory Visit: Payer: PPO | Attending: Cardiology | Admitting: Cardiology

## 2022-08-25 ENCOUNTER — Encounter: Payer: Self-pay | Admitting: Cardiology

## 2022-08-25 VITALS — BP 140/64 | HR 52 | Ht 70.0 in | Wt 182.2 lb

## 2022-08-25 DIAGNOSIS — I10 Essential (primary) hypertension: Secondary | ICD-10-CM | POA: Diagnosis not present

## 2022-08-25 DIAGNOSIS — I4891 Unspecified atrial fibrillation: Secondary | ICD-10-CM | POA: Diagnosis not present

## 2022-08-25 DIAGNOSIS — E782 Mixed hyperlipidemia: Secondary | ICD-10-CM | POA: Diagnosis not present

## 2022-08-25 DIAGNOSIS — Z952 Presence of prosthetic heart valve: Secondary | ICD-10-CM

## 2022-08-25 MED ORDER — AMLODIPINE BESYLATE 10 MG PO TABS: 10.0000 mg | ORAL_TABLET | Freq: Every day | ORAL | 6 refills | Status: DC

## 2022-08-25 NOTE — Progress Notes (Signed)
Clinical Summary Mr. Ogan is a 67 y.o.male seen today for follow up of the following medical problems      1. AVR - history of bicuspid AV, s/p bioprosthetic AVR in 2012 Jan 2023 echo normal AVR - 05/2022 echo normal AV   - no SOB/DOE, rides stationary bike daily 30-60 min   2. Chest pain - no recent issues     3. Ascending aortic aneurysm - mild aortic root dilatation at 4.1 to 4.2 cm by last MRI 10/2013, likely related to hx of bicuspid AV and potential coexisting aortopathy - CTA 10/2014 4.1 cm ascending aortic aneurysm.   - CTA 11/2016 stable 4 cm aneurysm   10/2017 CTA Chest/Abd/Pelvis Sovah (in epic labeled as CT A/P): 4 cm aneurysm - no recent chest pain   01/2019 CTA stable 3.7 cm(reported overestimated by prior imaging) 01/2020 CTA 3.9 cm acending aorta, overall stable 06/2021 CT PE: no aneurysm   3. HTN - on higher dose of lisinopril had symptomatic low bp's   - not checking at home      4. OSA - not compliant with CPAP due to discomfort. Has not been intrested in retyring     5. Hyperlipidemia   05/2022 TC 79 TG 63 HDL 25 LDL 41   6. DM2 - followed by pcp       7. Pulmonary  Nodules - have been stable by imaging, monitored along with his aneurysm with his annual CT chests   8. Carotid stenosis - followed by vascular 09/2021 carotid US: mod bilateral disease  05/2022 RICA 60-79%, RICA 40-59%    9. COVID pneumonia 06/2021 - severeal day hospital admission, discharged on O2. He is now off.    10. Afib, isolated - new diagnosis during Jan 2023 admission - had just been discharged for severe case of COVID pneumonia, readmitted few days later with dehydration and AKI, found to be in afib with RVR.  - he asymptomatic - no bleeding on eliqus - dizziness on dilt 120.     02/2022 30 day monitor: no afib   Past Medical History:  Diagnosis Date   Aortic stenosis 8/09   Bicuspid aortic valve   Arthritis 6/10   hand    Atrial fibrillation  ([place])    post op only   Bile duct calculus with acute cholecystitis    CAD (coronary artery disease) 8/09   Calculus of common bile duct with acute pancreatitis 11/2016   CTS (carpal tunnel syndrome)    bilateral    Diabetes ([place])    Diastolic dysfunction [id number]   Elevated LFTs    Fatty liver disease, nonalcoholic    History of kidney stones    Hyperlipidemia    Hypertension    Kidney stones    Nasal congestion    NIDDM (non-insulin dependent diabetes mellitus)    x15 yrs   OSA on CPAP 2006   Paresthesias in left hand 02/19/2004   Paresthesias in right hand 02/19/2004   Sinusitis    Vitamin D deficiency 06/13/09     Allergies  Allergen Reactions   Glyxambi [Empagliflozin-Linagliptin] Diarrhea and Itching    He was able to take Jardiance alone with no side effects so it was the Tradjenta part that most likely gave him the reaction   Hctz [Hydrochlorothiazide]     Nausea and headache    Sulfa Antibiotics Nausea And Vomiting   Sulfonamide Derivatives      Current Outpatient Medications  Medication Sig  Dispense Refill   acetaminophen (TYLENOL) 325 MG tablet Take 2 tablets (650 mg total) by mouth every 6 (six) hours as needed for mild pain (or Fever >/= 101). 12 tablet 0   aspirin EC 81 MG tablet Take 1 tablet (81 mg total) by mouth daily. Swallow whole.     atenolol (TENORMIN) 25 MG tablet Take 1 tablet (25 mg total) by mouth daily. 90 tablet 3   atorvastatin (LIPITOR) 40 MG tablet Take 1 tablet (40 mg total) by mouth daily. 90 tablet 3   blood glucose meter kit and supplies KIT Dispense based on patient and insurance preference. Use up to four times daily as directed. (FOR ICD-9 250.00, 250.01). 1 each 0   cholecalciferol (VITAMIN D) 1000 UNITS tablet Take 2,000 Units by mouth 2 (two) times daily.     diltiazem (CARDIZEM) 30 MG tablet TAKE 1 TABLET BY MOUTH TWICE DAILY 60 tablet 6   glipiZIDE (GLUCOTROL) 5 MG tablet Take 2 tablets (10 mg total) by mouth 2 (two) times daily  before a meal. 360 tablet 3   insulin degludec (TRESIBA FLEXTOUCH) 100 UNIT/ML FlexTouch Pen Inject 10 Units into the skin daily. (Patient taking differently: Inject 14 Units into the skin daily.) 15 mL 2   Insulin Pen Needle (GLOBAL EASE INJECT PEN NEEDLES) 31G X 5 MM MISC Use daily as directed Dx E11.69 100 each 3   Lancets (ONETOUCH DELICA PLUS 123XX123) MISC 4 (four) times daily.     metFORMIN (GLUCOPHAGE) 1000 MG tablet Take 1 tablet (1,000 mg total) by mouth 2 (two) times daily with a meal. 180 tablet 3   ONETOUCH ULTRA test strip Test BS 4 times daily Dx E11.69 400 each 3   Semaglutide (RYBELSUS) 14 MG TABS Take 1 tablet (14 mg total) by mouth daily. 90 tablet 1   temazepam (RESTORIL) 15 MG capsule TAKE ONE CAPSULE BY MOUTH AT BEDTIME AS NEEDED (Patient taking differently: Take 15 mg by mouth at bedtime as needed for sleep.) 30 capsule 1   No current facility-administered medications for this visit.     Past Surgical History:  Procedure Laterality Date   bladder cancer - operation - 01/21/18 - Dr Tresa Lenis  01/21/2018   CARPAL TUNNEL RELEASE     bilateral    CHOLECYSTECTOMY  11/2016   COLONOSCOPY N/A 09/22/2018   Procedure: COLONOSCOPY;  Surgeon: Rogene Houston, MD;  Location: [facility];  Service: Endoscopy;  Laterality: N/A;  1:00   CORONARY ANGIOPLASTY WITH STENT PLACEMENT     to rt coronary atery (Dr. Degert-cardiologist)   [place], URETEROSCOPY AND STENT PLACEMENT Left 03/07/2018   Procedure: CYSTOSCOPY WITH RETROGRADE PYELOGRAM,  AND STENT PLACEMENT;  Surgeon: [name] Frock, MD;  Location: [facility];  Service: Urology;  Laterality: Left;   ERCP N/A 11/24/2016   Procedure: ENDOSCOPIC RETROGRADE CHOLANGIOPANCREATOGRAPHY (ERCP);  Surgeon: Rogene Houston, MD;  Location: [facility];  Service: Endoscopy;  Laterality: N/A;   EXTRACORPOREAL SHOCK WAVE LITHOTRIPSY Left 03/17/2018   Procedure: LEFT EXTRACORPOREAL SHOCK WAVE LITHOTRIPSY (ESWL);  Surgeon:  Irine Seal, MD;  Location: [facility];  Service: Urology;  Laterality: Left;   REMOVAL OF STONES N/A 11/24/2016   Procedure: REMOVAL OF STONES;  Surgeon: Rogene Houston, MD;  Location: [facility];  Service: Endoscopy;  Laterality: N/A;   SPHINCTEROTOMY N/A 11/24/2016   Procedure: SPHINCTEROTOMY;  Surgeon: Rogene Houston, MD;  Location: [facility];  Service: Endoscopy;  Laterality: N/A;   TISSUE AORTIC VALVE  REPLACEMENT     2012     Allergies  Allergen Reactions   Glyxambi [Empagliflozin-Linagliptin] Diarrhea and Itching    He was able to take Jardiance alone with no side effects so it was the Tradjenta part that most likely gave him the reaction   Hctz [Hydrochlorothiazide]     Nausea and headache    Sulfa Antibiotics Nausea And Vomiting   Sulfonamide Derivatives       Family History  Problem Relation Age of Onset   Hearing loss Mother    Alzheimer's disease Mother    Hearing loss Father    Diabetes Father    Diabetes Other        Family History      Social History Mr. Dittoe reports that he quit smoking about 20 years ago. His smoking use included cigarettes. He started smoking about 48 years ago. He has a 37.50 pack-year smoking history. He has never been exposed to tobacco smoke. He has never used smokeless tobacco. Mr. Beales reports no history of alcohol use.   Review of Systems CONSTITUTIONAL: No weight loss, fever, chills, weakness or fatigue.  HEENT: Eyes: No visual loss, blurred vision, double vision or yellow sclerae.No hearing loss, sneezing, congestion, runny nose or sore throat.  SKIN: No rash or itching.  CARDIOVASCULAR: per hpi RESPIRATORY: No shortness of breath, cough or sputum.  GASTROINTESTINAL: No anorexia, nausea, vomiting or diarrhea. No abdominal pain or blood.  GENITOURINARY: No burning on urination, no polyuria NEUROLOGICAL: No headache, dizziness, syncope, paralysis, ataxia, numbness or tingling in the extremities. No change in bowel or  bladder control.  MUSCULOSKELETAL: No muscle, back pain, joint pain or stiffness.  LYMPHATICS: No enlarged nodes. No history of splenectomy.  PSYCHIATRIC: No history of depression or anxiety.  ENDOCRINOLOGIC: No reports of sweating, cold or heat intolerance. No polyuria or polydipsia.  [name]   Physical Examination Today's Vitals   08/25/22 0816  BP: (!) 140/64  Pulse: (!) 52  SpO2: 97%  Weight: 182 lb 3.2 oz (82.6 kg)  Height: 5' 10"$  (1.778 m)   Body mass index is 26.14 kg/m.  Gen: resting comfortably, no acute distress HEENT: no scleral icterus, pupils equal round and reactive, no palptable cervical adenopathy,  CV: RRR, no m/rg, no jvd Resp: Clear to auscultation bilaterally GI: abdomen is soft, non-tender, non-distended, normal bowel sounds, no hepatosplenomegaly MSK: extremities are warm, no edema.  Skin: warm, no rash Neuro:  no focal deficits Psych: appropriate affect   Diagnostic Studies  09/2012 MRI/MRA Chest Stable 4 cm ascending thoracic aneurysm   08/2010 Cath HEMODYNAMIC DATA: Right atrial pressures 7/5 with a mean of 4 mmHg,   right ventricular pressure is 24 with an EDP of 7 mmHg. Pulmonary   artery pressure is 20/7 with a mean of 13 mmHg. Pulmonary capillary   wedge pressure is 10/7 with a mean of 6 mmHg. Aortic pressure is 118/67   with a mean of 88 mmHg. Left ventricular pressure is 161 with an EDP of   12 mmHg. There is no significant mitral valve gradient. The aortic   valve mean gradient is 31 mmHg with calculated valve area of 0.8   centimeter squared. Thermodilution cardiac output is 4.4 L per minute   with an index of 2, Fick cardiac outputs 4.7 L per minute with an index   of 2.2.   ANGIOGRAPHIC DATA: The left ventricular angiography was performed in a   RAO view. This demonstrates normal left ventricular size and  contractility with normal systolic function. Ejection fraction is   estimated at 65%. The aortic valve is calcified with eccentric  opening   and restrictive mobility.   The left coronary artery arises and distributes normally. The left main   coronary has 20% narrowing in the ostium and in the distal left main.   The left anterior descending artery is a tortuous vessel which has less   than 10% irregularities.   Left circumflex coronary artery is rise to first obtuse marginal vessel   which has a 40-50% stenosis proximally.   The right coronary artery is a dominant vessel, it has 20% disease in   the distal vessel. The stent in the midvessel was widely patent.   FINAL INTERPRETATION:   1. Nonobstructive atherosclerotic coronary artery disease.   2. Normal left ventricular function.   3. Severe aortic stenosis.   4. Normal right heart pressures.   PLAN: Proceed with aortic valve replacement.   10/2013 MRA Chest EXAM: MRA CHEST WITH OR WITHOUT CONTRAST   TECHNIQUE: Angiographic images of the chest were obtained using MRA technique without and with intravenous contrast.   CONTRAST:  63m MULTIHANCE GADOBENATE DIMEGLUMINE 529 MG/ML IV SOLN   COMPARISON:  CT UROGRAM dated 05/08/2011; MR MRA CHEST W/ OR W/O CM dated 10/07/2012; CT ANGIO CHEST dated 09/08/2010   FINDINGS: There is stable aneurysmal dilatation of the aortic root that does not involve the sinuses of Valsalva. No aortic dissection is present. Artifact is noted from a prosthetic aortic valve. Diameter at the level of the sinuses of Valsalva is approximately 3.2 cm. Maximal caliber of the ascending thoracic aorta is 4.1- 4.2 cm. The proximal arch measures 3.8 cm. The distal arch measures 2.6 cm. The descending thoracic aorta measures 2.6 cm. Proximal great vessels show stable and normal patency without anatomical variant.   The heart size is normal. No pleural or pericardial fluid is identified. No masses or lymphadenopathy are seen. Visualized spine shows no abnormalities.   IMPRESSION: Stable aneurysmal dilatation of the ascending thoracic  aorta measuring 4.1- 4.2 cm in greatest diameter. No dissection is identified.   10/2014 CTA Chest IMPRESSION: 1. Mild fusiform aneurysmal dilatation of the ascending thoracic aorta measuring approximately 41 mm in maximal diameter, stable since the 2012 examination. 2. Stable sequela of prior median sternotomy and aortic valve replacement. 3. Coronary artery calcifications. 4. Punctate (approximately 5 mm) in determine right lower lobe pulmonary nodule, not definitely seen on the 20/2012 examination. If the patient is at high risk for bronchogenic carcinoma, follow-up chest CT at 6-12 months is recommended. If the patient is at low risk for bronchogenic carcinoma, follow-up chest CT at 12 months is recommended. This recommendation follows the consensus statement:   11/2015 CTA chest   IMPRESSION: Mild aneurysmal dilatation of the ascending aorta with a maximal diameter of 4.0 cm. This is not significantly changed.     11/2016 echo Study Conclusions   - Left ventricle: The cavity size was normal. Wall thickness was   normal. Systolic function was normal. The estimated ejection   fraction was in the range of 60% to 65%. Wall motion was normal;   there were no regional wall motion abnormalities. Left   ventricular diastolic function parameters were normal. - Aortic valve: There isa 25-mm Edwards pericardial Magna-Ease   valve model #[id number] in the AV position. Mildly calcified   annulus. Trileaflet; normal thickness leaflets. There is a mild   gradient across the prosthetic valve. Mean gradient (S): 12 mm  Hg. Mean gradient of 18 mmHg with pedhoff probe. Valve area   (VTI): 1.71 cm^2. - Technically adequate study.   11/2016 CTA chest IMPRESSION: 1. Stable mild fusiform aneurysm of the ascending aorta, measuring maximum diameter of 4.0 cm. Recommend annual imaging followup by CTA or MRA. This recommendation follows 2010 ACCF/AHA/AATS/ACR/ASA/SCA/SCAI/SIR/STS/SVM Guidelines  for the Diagnosis and Management of Patients with Thoracic Aortic Disease. Circulation. 2010; 121: [id number] 2. Long-term stability of pulmonary nodules, consistent with benign process.     01/2019 Chest CTA IMPRESSION: 1. Stable appearance of ascending thoracic aorta. Maximal measurement is 3.7 cm. This was overestimated on axial imaging previously. Recommend annual imaging followup by CTA or MRA. This recommendation follows 2010 ACCF/AHA/AATS/ACR/ASA/SCA/SCAI/SIR/STS/SVM Guidelines for the Diagnosis and Management of Patients with Thoracic Aortic Disease. Circulation.2010; [id number]. Aortic aneurysm NOS (ICD10-I71.9) 2. Stable peripheral pleural based subcentimeter pulmonary nodules, likely inflammatory. No follow-up necessary. 3.  Aortic Atherosclerosis (ICD10-I70.0).   01/2020 CTA IMPRESSION: 1. Ascending aorta is at the upper limits of normal in size, stable. 2. Hepatic steatosis. Mild marginal irregularity the liver is indicative of cirrhosis. 3. Aortic atherosclerosis (ICD10-I70.0). Coronary artery calcification. 4.  Emphysema (ICD10-J43.9).       Jan 2023 echo   IMPRESSIONS     1. Left ventricular ejection fraction, by estimation, is 60 to 65%. The  left ventricle has normal function. The left ventricle has no regional  wall motion abnormalities. There is mild left ventricular hypertrophy.  Left ventricular diastolic parameters  were normal.   2. Right ventricular systolic function is normal. The right ventricular  size is normal. Tricuspid regurgitation signal is inadequate for assessing  PA pressure.   3. The mitral valve is degenerative. Trivial mitral valve regurgitation.  No evidence of mitral stenosis. The mean mitral valve gradient is 2.0  mmHg.   4. The aortic valve has been repaired/replaced. Aortic valve  regurgitation is not visualized. There is a 25 mm [facility] Ease  pericardial valve present in the aortic position. Aortic valve mean   gradient measures 6.5 mmHg. Grossly normal prosthetic  function.   5. The inferior vena cava is normal in size with greater than 50%  respiratory variability, suggesting right atrial pressure of 3 mmHg.     02/2022 monitor 30 day monitor   Min HR 51, Max HR 107, Avg HR 70   No symptoms reported   Telemetry tracings show normal sinus rhythm, no significant arrhythmias   05/2022 echo 1. Left ventricular ejection fraction, by estimation, is 55 to 60%. The  left ventricle has normal function. The left ventricle has no regional  wall motion abnormalities. There is mild left ventricular hypertrophy.  Left ventricular diastolic parameters  are indeterminate. Elevated left atrial pressure.   2. Right ventricular systolic function is normal. The right ventricular  size is normal. There is normal pulmonary artery systolic pressure.   3. The mitral valve is normal in structure. No evidence of mitral valve  regurgitation. No evidence of mitral stenosis.   4. 25-mm Edwards pericardial valve is in the AV position. . The aortic  valve has been repaired/replaced. Aortic valve regurgitation is not  visualized. No aortic stenosis is present.   5. Aortic dilatation noted. There is mild dilatation of the aortic root,  measuring 40 mm.   6. The inferior vena cava is normal in size with greater than 50%  respiratory variability, suggesting right atrial pressure of 3 mmHg.   05/2022 carotid US Right Carotid: Velocities in the right ICA are  consistent with a 60-79%                 stenosis, calcific plaque may obscure higher velocity.   Left Carotid: Velocities in the left ICA are consistent with a 40-59%  stenosis.   Vertebrals: Bilateral vertebral arteries demonstrate antegrade flow.  Subclavians: Normal flow hemodynamics were seen in bilateral subclavian               arteries.   Assessment and Plan  1. Aortic valve replacement - hx of bicuspid AV, s/p bioprosthetic AVR in 2012 - 05/2022 echo  normal AVR - no symptoms, rides stationary bike 68mn to 1 hour daily - continue to monitor   2.. HTN - above goal. With low HRs stop diltiazem, start norvasc 154mdaily.      4. Afib - recent diagnosis Jan 2023 during admission. Had COVID infection around that time, unclear if afib solely secondary to COVID - 30 day monitor without recurrent afib, anticoag was stopped. Appears to have been isolated afib in setting of COVID/systemic illness - continue to monitor   5. Hyperlipidemia -he is at goal, continue current meds      JoArnoldo LenisM.D.

## 2022-08-25 NOTE — Patient Instructions (Signed)
Medication Instructions:  Stop Diltiazem Begin Norvasc 83m daily   Labwork: none  Testing/Procedures: none  Follow-Up: 6 months   Any Other Special Instructions Will Be Listed Below (If Applicable).   If you need a refill on your cardiac medications before your next appointment, please call your pharmacy.

## 2022-08-27 ENCOUNTER — Encounter: Payer: Self-pay | Admitting: Pharmacist

## 2022-08-31 ENCOUNTER — Ambulatory Visit (HOSPITAL_COMMUNITY): Admission: RE | Admit: 2022-08-31 | Payer: PPO | Source: Ambulatory Visit

## 2022-09-08 DIAGNOSIS — Z6827 Body mass index (BMI) 27.0-27.9, adult: Secondary | ICD-10-CM | POA: Diagnosis not present

## 2022-09-08 DIAGNOSIS — M25551 Pain in right hip: Secondary | ICD-10-CM | POA: Diagnosis not present

## 2022-09-08 DIAGNOSIS — M48062 Spinal stenosis, lumbar region with neurogenic claudication: Secondary | ICD-10-CM | POA: Diagnosis not present

## 2022-09-09 ENCOUNTER — Encounter (HOSPITAL_COMMUNITY): Payer: Self-pay

## 2022-09-09 ENCOUNTER — Ambulatory Visit (HOSPITAL_COMMUNITY)
Admission: RE | Admit: 2022-09-09 | Discharge: 2022-09-09 | Disposition: A | Discharge status: Home / Self Care | Payer: PPO | Source: Ambulatory Visit | Attending: Sports Medicine | Admitting: Sports Medicine

## 2022-09-09 DIAGNOSIS — M25552 Pain in left hip: Secondary | ICD-10-CM | POA: Diagnosis not present

## 2022-09-09 DIAGNOSIS — M1612 Unilateral primary osteoarthritis, left hip: Secondary | ICD-10-CM | POA: Insufficient documentation

## 2022-09-09 MED ORDER — BUPIVACAINE HCL (PF) 0.5 % IJ SOLN
INTRAMUSCULAR | Status: AC
  Administered 2022-09-09: 4 mL
  Filled 2022-09-09: qty 30

## 2022-09-09 MED ORDER — TRIAMCINOLONE ACETONIDE 40 MG/ML IJ SUSP
INTRAMUSCULAR | Status: AC
  Administered 2022-09-09: 40 mg
  Filled 2022-09-09: qty 1

## 2022-09-09 MED ORDER — IOHEXOL 180 MG/ML  SOLN
4.0000 mL | Freq: Once | INTRAMUSCULAR | Status: AC
  Administered 2022-09-09: 4 mL via INTRA_ARTICULAR

## 2022-09-09 MED ORDER — LIDOCAINE HCL (PF) 1 % IJ SOLN
INTRAMUSCULAR | Status: AC
  Administered 2022-09-09: 5 mL
  Filled 2022-09-09: qty 5

## 2022-10-21 ENCOUNTER — Encounter: Payer: Self-pay | Admitting: Family Medicine

## 2022-10-21 DIAGNOSIS — E1169 Type 2 diabetes mellitus with other specified complication: Secondary | ICD-10-CM

## 2022-10-21 DIAGNOSIS — I10 Essential (primary) hypertension: Secondary | ICD-10-CM

## 2022-10-21 MED ORDER — ATENOLOL 25 MG PO TABS: 25.0000 mg | ORAL_TABLET | Freq: Every day | ORAL | 0 refills | Status: DC

## 2022-10-21 MED ORDER — METFORMIN HCL 1000 MG PO TABS: 1000.0000 mg | ORAL_TABLET | Freq: Two times a day (BID) | ORAL | 0 refills | Status: DC

## 2022-10-21 NOTE — Telephone Encounter (Signed)
Dr. Louanne Skye,  Please review for refills of Metformin and Atenolol. The pharmacy will then send PA request. In covermymeds on 4/16 pt did not have any PA's pending.

## 2022-10-29 ENCOUNTER — Ambulatory Visit (INDEPENDENT_AMBULATORY_CARE_PROVIDER_SITE_OTHER): Payer: PPO | Admitting: Gastroenterology

## 2022-10-29 ENCOUNTER — Encounter (INDEPENDENT_AMBULATORY_CARE_PROVIDER_SITE_OTHER): Payer: Self-pay | Admitting: Gastroenterology

## 2022-10-29 VITALS — BP 147/78 | HR 61 | Temp 98.6°F | Ht 70.0 in | Wt 191.2 lb

## 2022-10-29 DIAGNOSIS — R140 Abdominal distension (gaseous): Secondary | ICD-10-CM | POA: Diagnosis not present

## 2022-10-29 DIAGNOSIS — K589 Irritable bowel syndrome without diarrhea: Secondary | ICD-10-CM | POA: Diagnosis not present

## 2022-10-29 DIAGNOSIS — K760 Fatty (change of) liver, not elsewhere classified: Secondary | ICD-10-CM

## 2022-10-29 DIAGNOSIS — R142 Eructation: Secondary | ICD-10-CM | POA: Diagnosis not present

## 2022-10-29 NOTE — Progress Notes (Signed)
Referring Provider: Dettinger, [name], MD Primary Care Physician:  Dettinger, [name], MD Primary GI Physician: Levon Hedger   Chief Complaint  Patient presents with   Irritable Bowel Syndrome    Follow up on IBS. Would like rx for metronidazole 250mg  to have on hand if he needs it.    HPI:   [name] [name] is a 67 y.o. male with past medical history of aortic stenosis, arthritis, afib, cad, bile duct calculus with pancreatitis, DM, NAFLD, HLD, HTN, kidney stones, OSA on cpap, vitamin d deficiency.   Patient presenting today for follow up of IBS/query SIBO  Last seen September 2022, at that time having issues 1-2x/month, reports that he will get bad gas, bloating, abdominal pain and a bad taste in his mouth, this has been ongoing/chronic for him. He will take a dose of flagyl which will resolve his symptoms. He reports typically having 2-3 BMs per day. He has constipation maybe 1-2x per month which is when he experiences symptoms, he takes a stool softener at that time which helps induce a BM. He reports that symptoms feel well managed with as needed flagyl. He does report a decreased appetite, but he is on rybelsus and notices that he will be hungry when he wakes up but after taking this he has a much lower appetite throughout the day.  concerns for fatty liver vs cirrhosis after abnormal CT in July 2021 showed hepatic steatosis with mild marginal irregularity of the liver indicating possible cirrhosis, however, [place] elastrography of liver in sept 2021 showed no hepatic masses, no CBD dilation, kPa: 6.2. not consistent with cirrhosis, should repeat elastography [place] again in 2024 per Dr. Patty Sermons recommendation.  Most recent LFTS 02/2021 WNL. INR was 1 on 03/20/20  Recommended continue flagyl 250mg  PRN, continue stool softener.  Present:  He notes that he recently ran out of his flagyl, he states that he uses this sporadically when he has bloating, belching with a foul taste in his mouth,  abdominal pain, sometimes diarrhea, but usually constipation, he will take one flagyl tablet and have resolution of his symptoms. He notes that he may require this twice in a week or may go a few months without needing it. Appetite is good. No unintentional weight loss.  Denies rectal bleeding or melena.  he has never had SIBO testing or done a course of xifaxan, he is taking a probiotic which seems to have cut down on the amount of symptoms he is having and how frequent. He last took flagyl about 1 month ago.    Last Colonoscopy:09/22/18- Diverticulosis in the sigmoid colon and at the hepatic flexure.- External and internal hemorrhoids. No specimens Last Endoscopy:n/a   Recommendations:  Repeat colonoscopy in 2030   Past Medical History:  Diagnosis Date   Aortic stenosis 8/09   Bicuspid aortic valve   Arthritis 6/10   hand    Atrial fibrillation    post op only   Bile duct calculus with acute cholecystitis    CAD (coronary artery disease) 8/09   Calculus of common bile duct with acute pancreatitis 11/2016   CTS (carpal tunnel syndrome)    bilateral    Diabetes    Diastolic dysfunction 8/09   Elevated LFTs    Fatty liver disease, nonalcoholic    History of kidney stones    Hyperlipidemia    Hypertension    Kidney stones    Nasal congestion    NIDDM (non-insulin dependent diabetes mellitus)    x15 yrs  OSA on CPAP 2006   Paresthesias in left hand 02/19/2004   Paresthesias in right hand 02/19/2004   Sinusitis    Vitamin D deficiency 06/13/09    Past Surgical History:  Procedure Laterality Date   bladder cancer - operation - 01/21/18 - Dr Berneice Heinrich  01/21/2018   CARPAL TUNNEL RELEASE     bilateral    CHOLECYSTECTOMY  11/2016   COLONOSCOPY N/A 09/22/2018   Procedure: COLONOSCOPY;  Surgeon: Malissa Hippo, MD;  Location: [facility];  Service: Endoscopy;  Laterality: N/A;  1:00   CORONARY ANGIOPLASTY WITH STENT PLACEMENT     to rt coronary atery (Dr. Degert-cardiologist)    CYSTOSCOPY WITH RETROGRADE PYELOGRAM, URETEROSCOPY AND STENT PLACEMENT Left 03/07/2018   Procedure: CYSTOSCOPY WITH RETROGRADE PYELOGRAM,  AND STENT PLACEMENT;  Surgeon: Sebastian Ache, MD;  Location: [facility];  Service: Urology;  Laterality: Left;   ERCP N/A 11/24/2016   Procedure: ENDOSCOPIC RETROGRADE CHOLANGIOPANCREATOGRAPHY (ERCP);  Surgeon: Malissa Hippo, MD;  Location: [facility];  Service: Endoscopy;  Laterality: N/A;   EXTRACORPOREAL SHOCK WAVE LITHOTRIPSY Left 03/17/2018   Procedure: LEFT EXTRACORPOREAL SHOCK WAVE LITHOTRIPSY (ESWL);  Surgeon: Bjorn Pippin, MD;  Location: [facility];  Service: Urology;  Laterality: Left;   REMOVAL OF STONES N/A 11/24/2016   Procedure: REMOVAL OF STONES;  Surgeon: Malissa Hippo, MD;  Location: [facility];  Service: Endoscopy;  Laterality: N/A;   SPHINCTEROTOMY N/A 11/24/2016   Procedure: SPHINCTEROTOMY;  Surgeon: Malissa Hippo, MD;  Location: [facility];  Service: Endoscopy;  Laterality: N/A;   TISSUE AORTIC VALVE REPLACEMENT     2012    Current Outpatient Medications  Medication Sig Dispense Refill   acetaminophen (TYLENOL) 325 MG tablet Take 2 tablets (650 mg total) by mouth every 6 (six) hours as needed for mild pain (or Fever >/= 101). 12 tablet 0   amLODipine (NORVASC) 10 MG tablet Take 1 tablet (10 mg total) by mouth daily. 30 tablet 6   amoxicillin (AMOXIL) 500 MG capsule Take 500 mg by mouth once. Prior to dental appointments     aspirin EC 81 MG tablet Take 1 tablet (81 mg total) by mouth daily. Swallow whole.     atenolol (TENORMIN) 25 MG tablet Take 1 tablet (25 mg total) by mouth daily. 90 tablet 0   atorvastatin (LIPITOR) 40 MG tablet Take 1 tablet (40 mg total) by mouth daily. 90 tablet 3   blood glucose meter kit and supplies KIT Dispense based on patient and insurance preference. Use up to four times daily as directed. (FOR ICD-9 250.00, 250.01). 1 each 0   cholecalciferol (VITAMIN D) 1000 UNITS tablet Take 2,000 Units by mouth 2  (two) times daily.     diclofenac Sodium (VOLTAREN) 1 % GEL SMARTSIG:2 Gram(s) Topical 3 Times Daily PRN     glipiZIDE (GLUCOTROL) 5 MG tablet Take 2 tablets (10 mg total) by mouth 2 (two) times daily before a meal. 360 tablet 3   insulin degludec (TRESIBA FLEXTOUCH) 100 UNIT/ML FlexTouch Pen Inject 10 Units into the skin daily. (Patient taking differently: Inject 14 Units into the skin daily.) 15 mL 2   Insulin Pen Needle (GLOBAL EASE INJECT PEN NEEDLES) 31G X 5 MM MISC Use daily as directed Dx E11.69 100 each 3   Lancets (ONETOUCH DELICA PLUS LANCET33G) MISC 4 (four) times daily.     metFORMIN (GLUCOPHAGE) 1000 MG tablet Take 1 tablet (1,000 mg total) by mouth 2 (two) times daily with a meal.  180 tablet 0   ONETOUCH ULTRA test strip Test BS 4 times daily Dx E11.69 400 each 3   Probiotic Product (ALIGN PO) Take by mouth daily.     Semaglutide (RYBELSUS) 14 MG TABS Take 1 tablet (14 mg total) by mouth daily. 90 tablet 1   temazepam (RESTORIL) 15 MG capsule TAKE ONE CAPSULE BY MOUTH AT BEDTIME AS NEEDED (Patient taking differently: Take 15 mg by mouth at bedtime as needed for sleep.) 30 capsule 1   No current facility-administered medications for this visit.    Allergies as of 10/29/2022 - Review Complete 10/29/2022  Allergen Reaction Noted   Glyxambi [empagliflozin-linagliptin] Diarrhea and Itching 02/26/2017   Hctz [hydrochlorothiazide]  11/25/2010   Sulfa antibiotics Nausea And Vomiting 11/25/2010   Sulfonamide derivatives  05/24/2009    Family History  Problem Relation Age of Onset   Hearing loss Mother    Alzheimer's disease Mother    Hearing loss Father    Diabetes Father    Diabetes Other        Family History     Social History   Socioeconomic History   Marital status: Married    Spouse name: [name] [name]   Number of children: 3   Years of education: Not on file   Highest education level: High school graduate  Occupational History   Occupation: Retired  Tobacco Use    Smoking status: Former    Packs/day: 1.50    Years: 25.00    Additional pack years: 0.00    Total pack years: 37.50    Types: Cigarettes    Start date: 07/06/1974    Quit date: 07/06/2002    Years since quitting: 20.3    Passive exposure: Never   Smokeless tobacco: Never  Vaping Use   Vaping Use: Never used  Substance and Sexual Activity   Alcohol use: No    Alcohol/week: 0.0 standard drinks of alcohol   Drug use: No   Sexual activity: Not on file  Other Topics Concern   Not on file  Social History Narrative   Not on file   Social Determinants of Health   Financial Resource Strain: Low Risk  (12/02/2021)   Overall Financial Resource Strain (CARDIA)    Difficulty of Paying Living Expenses: Not hard at all  Food Insecurity: No Food Insecurity (12/02/2021)   Hunger Vital Sign    Worried About Running Out of Food in the Last Year: Never true    Ran Out of Food in the Last Year: Never true  Transportation Needs: No Transportation Needs (12/02/2021)   PRAPARE - Administrator, Civil Service (Medical): No    Lack of Transportation (Non-Medical): No  Physical Activity: Sufficiently Active (12/02/2021)   Exercise Vital Sign    Days of Exercise per Week: 7 days    Minutes of Exercise per Session: 50 min  Stress: No Stress Concern Present (12/02/2021)   [employer] of Occupational Health - Occupational Stress Questionnaire    Feeling of Stress : Not at all  Social Connections: Socially Integrated (12/02/2021)   Social Connection and Isolation Panel [NHANES]    Frequency of Communication with Friends and Family: More than three times a week    Frequency of Social Gatherings with Friends and Family: More than three times a week    Attends Religious Services: More than 4 times per year    Active Member of [employer] or Organizations: Yes    Attends Banker Meetings: More than 4 times  per year    Marital Status: Married   Review of systems General: negative for  malaise, night sweats, fever, chills, weight loss Neck: Negative for lumps, goiter, pain and significant neck swelling Resp: Negative for cough, wheezing, dyspnea at rest CV: Negative for chest pain, leg swelling, palpitations, orthopnea GI: denies melena, hematochezia, nausea, vomiting, diarrhea, constipation, dysphagia, odyonophagia, early satiety or unintentional weight loss. +occasional diarrhea, bloating, belching, abdominal pain MSK: Negative for joint pain or swelling, back pain, and muscle pain. Derm: Negative for itching or rash Psych: Denies depression, anxiety, memory loss, confusion. No homicidal or suicidal ideation.  Heme: Negative for prolonged bleeding, bruising easily, and swollen nodes. Endocrine: Negative for cold or heat intolerance, polyuria, polydipsia and goiter. Neuro: negative for tremor, gait imbalance, syncope and seizures. The remainder of the review of systems is noncontributory.  Physical Exam: BP (!) 147/78 (BP Location: Left Arm, Patient Position: Sitting, Cuff Size: Large)   Pulse 61   Temp 98.6 F (37 C) (Oral)   Ht 5\' 10"  (1.778 m)   Wt 191 lb 3.2 oz (86.7 kg)   BMI 27.43 kg/m  General:   Alert and oriented. No distress noted. Pleasant and cooperative.  Head:  Normocephalic and atraumatic. Eyes:  Conjuctiva clear without scleral icterus. Mouth:  Oral mucosa pink and moist. Good dentition. No lesions. Heart: Normal rate and rhythm, s1 and s2 heart sounds present.  Lungs: Clear lung sounds in all lobes. Respirations equal and unlabored. Abdomen:  +BS, soft, non-tender and non-distended. No rebound or guarding. No HSM or masses noted. Derm: No palmar erythema or jaundice Msk:  Symmetrical without gross deformities. Normal posture. Extremities:  Without edema. Neurologic:  Alert and  oriented x4 Psych:  Alert and cooperative. Normal mood and affect.  Invalid input(s): "6 MONTHS"   ASSESSMENT: OSIE AMPARO is a 67 y.o. male presenting today for  follow up of IBS/query SIBO  Patient with intermittent abdominal pain, bloating, paraguesia, belching and constipation/diarrhea that he has been taking empiric flagyl for when symptoms occur for the past few years. Queried in the past if patient had SIBO though no SIBO testing or empiric Xifaxan course has been done. I discussed with the patient the importance of doing SIBO testing as long term use of antibiotics can pose potential risks and if he is positive for SIBO we can treat him with a course of Xifaxan that will hopefully eradicate the bacteria and resolve his symptoms. Patient does inquire if I can refill his flagyl, but I discussed with him at this time I am going to hold off on doing so as I if he takes flagyl prior to SIBO testing, the accuracy may be altered. Patient verbalized understanding of this.   Patient has history of diffuse hepatic steatosis for which he was recommended to have repeat US liver elastography in September 2024 as last was in September of 2021. LFTs were WNL in November 2023 and FIB4 score 1.30, therefore his liklihood of having advanced fibrosis is very low, will proceed with updated elastography in September in keeping with previous recommendations by Dr. Karilyn Cota.    PLAN:  US liver elastography in September  2. SIBO testing 3. Hold probiotic until after SIBO testing  4. Xifaxan course if SIBO testing positive   All questions were answered, patient verbalized understanding and is in agreement with plan as outlined above.   Follow Up: 3 months  Traylen Eckels L. Jeanmarie Hubert, MSN, APRN, AGNP-C Adult-Gerontology Nurse Practitioner [facility]  I have reviewed the note and agree with the APP's assessment as described in this progress note  Maylon Peppers, MD Gastroenterology and Hepatology [facility]

## 2022-10-29 NOTE — Patient Instructions (Signed)
We will repeat US of the liver in September  We will send you to [facility] for testing of bacterial overgrowth in your small intestine, at this time we need to hold off on using flagyl as this can alter the test results and I want to make sure we can accurately diagnose you, if testing is positive we can treat you with a special antibiotic that will eradicate the bacteria so that you hopefully will not have recurrence of symptoms. Please stop your probiotic 2 WEEKS prior to the test so that we make sure testing is accurate  Follow up 3 months

## 2022-11-01 DIAGNOSIS — R142 Eructation: Secondary | ICD-10-CM | POA: Insufficient documentation

## 2022-11-01 DIAGNOSIS — R140 Abdominal distension (gaseous): Secondary | ICD-10-CM | POA: Insufficient documentation

## 2022-11-05 ENCOUNTER — Encounter: Payer: Self-pay | Admitting: Family Medicine

## 2022-11-05 ENCOUNTER — Telehealth (INDEPENDENT_AMBULATORY_CARE_PROVIDER_SITE_OTHER): Payer: PPO | Admitting: Family Medicine

## 2022-11-05 DIAGNOSIS — R300 Dysuria: Secondary | ICD-10-CM | POA: Diagnosis not present

## 2022-11-05 LAB — URINALYSIS, COMPLETE
Bilirubin, UA: NEGATIVE
Leukocytes,UA: NEGATIVE
Nitrite, UA: NEGATIVE
Specific Gravity, UA: 1.025 (ref 1.005–1.030)
Urobilinogen, Ur: 0.2 mg/dL (ref 0.2–1.0)
pH, UA: 5 (ref 5.0–7.5)

## 2022-11-05 LAB — MICROSCOPIC EXAMINATION
Bacteria, UA: NONE SEEN
Renal Epithel, UA: NONE SEEN /hpf

## 2022-11-05 MED ORDER — CEPHALEXIN 500 MG PO CAPS
500.0000 mg | ORAL_CAPSULE | Freq: Two times a day (BID) | ORAL | 0 refills | Status: AC
Start: 2022-11-05 — End: 2022-11-12

## 2022-11-05 NOTE — Progress Notes (Signed)
   Virtual Visit via video Note   Due to COVID-19 pandemic this visit was conducted virtually. This visit type was conducted due to national recommendations for restrictions regarding the COVID-19 Pandemic (e.g. social distancing, sheltering in place) in an effort to limit this patient's exposure and mitigate transmission in our community. All issues noted in this document were discussed and addressed.  A physical exam was not performed with this format.  I connected with  [name] [name]  on 11/05/22 at 1636 by video and verified that I am speaking with the correct person using two identifiers. [name] [name] is currently located outside and no one is currently with him during the visit. The provider, Gabriel Earing, FNP is located in their office at time of visit.  I discussed the limitations, risks, security and privacy concerns of performing an evaluation and management service by video  and the availability of in person appointments. I also discussed with the patient that there may be a patient responsible charge related to this service. The patient expressed understanding and agreed to proceed.  CC: dysuria  History and Present Illness:  [name] [name] reports urinary frequency, darker urine, and some discomfort with urination for the last 3 days. He denies fever, chills, hematuria, flank pain, abdominal pain, swelling, or discharge. Denies hx of UTI. He does have a history of kidney stones but reports that he is not having pain like he usually does with kidney stones.    ROS As per HPI.  Observations/Objective: Alert and oriented x 3. Non toxic appearing. Respirations unlabored. Able to speak in full sentences without difficulty. Normal mood and behavior.   Urine dipstick shows positive for RBC's, positive for protein, positive for glucose, and positive for ketones.  Micro exam: 0-5 WBC's per HPF, 0-2 RBC's per HPF, and no+ bacteria.   Assessment and Plan: Diagnoses and all orders for this  visit:  Dysuria Will treat with keflex pending culture. Discussed increased hydration and return precautions.  -     Urinalysis, Complete -     Urine Culture -     Microscopic Examination -     cephALEXin (KEFLEX) 500 MG capsule; Take 1 capsule (500 mg total) by mouth 2 (two) times daily for 7 days.     Follow Up Instructions: As needed.     I discussed the assessment and treatment plan with the patient. The patient was provided an opportunity to ask questions and all were answered. The patient agreed with the plan and demonstrated an understanding of the instructions.   The patient was advised to call back or seek an in-person evaluation if the symptoms worsen or if the condition fails to improve as anticipated.  The above assessment and management plan was discussed with the patient. The patient verbalized understanding of and has agreed to the management plan. Patient is aware to call the clinic if symptoms persist or worsen. Patient is aware when to return to the clinic for a follow-up visit. Patient educated on when it is appropriate to go to the emergency department.   Time call ended: 1636  I provided 4 minutes of face-to-face time during this encounter.    Gabriel Earing, FNP

## 2022-11-07 LAB — URINE CULTURE: Organism ID, Bacteria: NO GROWTH

## 2022-11-23 ENCOUNTER — Encounter: Payer: Self-pay | Admitting: Family Medicine

## 2022-11-23 ENCOUNTER — Ambulatory Visit (INDEPENDENT_AMBULATORY_CARE_PROVIDER_SITE_OTHER): Payer: PPO | Admitting: Family Medicine

## 2022-11-23 VITALS — BP 136/70 | HR 65 | Temp 99.1°F | Ht 70.0 in | Wt 194.6 lb

## 2022-11-23 DIAGNOSIS — I152 Hypertension secondary to endocrine disorders: Secondary | ICD-10-CM | POA: Diagnosis not present

## 2022-11-23 DIAGNOSIS — Z7984 Long term (current) use of oral hypoglycemic drugs: Secondary | ICD-10-CM

## 2022-11-23 DIAGNOSIS — I10 Essential (primary) hypertension: Secondary | ICD-10-CM | POA: Diagnosis not present

## 2022-11-23 DIAGNOSIS — E785 Hyperlipidemia, unspecified: Secondary | ICD-10-CM | POA: Diagnosis not present

## 2022-11-23 DIAGNOSIS — E1169 Type 2 diabetes mellitus with other specified complication: Secondary | ICD-10-CM | POA: Diagnosis not present

## 2022-11-23 MED ORDER — FREESTYLE LIBRE 3 READER DEVI: 1.0000 | Freq: Four times a day (QID) | 1 refills | Status: DC

## 2022-11-23 MED ORDER — GLIPIZIDE 5 MG PO TABS: ORAL_TABLET | ORAL | 3 refills | Status: DC

## 2022-11-23 MED ORDER — ATENOLOL 25 MG PO TABS: 25.0000 mg | ORAL_TABLET | Freq: Every day | ORAL | 3 refills | Status: DC

## 2022-11-23 MED ORDER — FREESTYLE LIBRE 3 SENSOR MISC: 1.0000 | 3 refills | Status: DC

## 2022-11-23 MED ORDER — METFORMIN HCL 1000 MG PO TABS
1000.0000 mg | ORAL_TABLET | Freq: Two times a day (BID) | ORAL | 3 refills | Status: DC
Start: 2022-11-23 — End: 2023-09-07

## 2022-11-23 NOTE — Progress Notes (Signed)
BP 136/70   Pulse 65   Temp 99.1 F (37.3 C)   Ht 5\' 10"  (1.778 m)   Wt 194 lb 9.6 oz (88.3 kg)   SpO2 96%   BMI 27.92 kg/m    Subjective:   Patient ID: [name] [name], male    DOB: September 04, 1955, 67 y.o.   MRN: [id number]  HPI: [name] [name] is a 67 y.o. male presenting on 11/23/2022 for Medical Management of Chronic Issues   HPI Type 2 diabetes mellitus Patient comes in today for recheck of his diabetes. Patient has been currently taking metformin and glipizide and Tresiba, 14 units daily.  Patient admits he is not taking Rybelsus very often because of stomach issues. Patient is not currently on an ACE inhibitor/ARB. Patient has not seen an ophthalmologist this year. Patient denies any new issues with their feet. The symptom started onset as an adult hypertension and hyperlipidemia and CAD ARE RELATED TO DM   Hypertension Patient is currently on amlodipine and atenolol, and their blood pressure today is 136/70. Patient denies any lightheadedness or dizziness. Patient denies headaches, blurred vision, chest pains, shortness of breath, or weakness. Denies any side effects from medication and is content with current medication.   Hyperlipidemia and CAD Patient is coming in for recheck of his hyperlipidemia. The patient is currently taking atorvastatin. They deny any issues with myalgias or history of liver damage from it. They deny any focal numbness or weakness or chest pain.   He complains of stomach issues and indigestion but has appoint with gastroenterologist soon.  Relevant past medical, surgical, family and social history reviewed and updated as indicated. Interim medical history since our last visit reviewed. Allergies and medications reviewed and updated.  Review of Systems  Constitutional:  Negative for chills and fever.  Eyes:  Negative for visual disturbance.  Respiratory:  Negative for shortness of breath and wheezing.   Cardiovascular:  Negative for chest pain and leg  swelling.  Gastrointestinal:  Positive for abdominal pain.  Musculoskeletal:  Positive for back pain (Yes back pain and hip pain is going back to see an orthopedic). Negative for gait problem.  Skin:  Negative for rash.  Neurological:  Negative for dizziness and light-headedness.  All other systems reviewed and are negative.   Per HPI unless specifically indicated above   Allergies as of 11/23/2022       Reactions   Glyxambi [empagliflozin-linagliptin] Diarrhea, Itching   He was able to take Jardiance alone with no side effects so it was the Tradjenta part that most likely gave him the reaction   Hctz [hydrochlorothiazide]    Nausea and headache    Sulfa Antibiotics Nausea And Vomiting   Sulfonamide Derivatives         Medication List        Accurate as of Nov 23, 2022  3:44 PM. If you have any questions, ask your nurse or doctor.          acetaminophen 325 MG tablet Commonly known as: TYLENOL Take 2 tablets (650 mg total) by mouth every 6 (six) hours as needed for mild pain (or Fever >/= 101).   ALIGN PO Take by mouth daily.   amLODipine 10 MG tablet Commonly known as: NORVASC Take 1 tablet (10 mg total) by mouth daily.   aspirin EC 81 MG tablet Take 1 tablet (81 mg total) by mouth daily. Swallow whole.   atenolol 25 MG tablet Commonly known as: TENORMIN Take 1 tablet (25 mg  total) by mouth daily.   atorvastatin 40 MG tablet Commonly known as: LIPITOR Take 1 tablet (40 mg total) by mouth daily.   blood glucose meter kit and supplies Kit Dispense based on patient and insurance preference. Use up to four times daily as directed. (FOR ICD-9 250.00, 250.01).   cholecalciferol 1000 units tablet Commonly known as: VITAMIN D Take 2,000 Units by mouth 2 (two) times daily.   diclofenac Sodium 1 % Gel Commonly known as: VOLTAREN SMARTSIG:2 Gram(s) Topical 3 Times Daily PRN   FreeStyle Libre 3 Reader Devi 1 each by Does not apply route 4 (four) times  daily. Started by: Nils Pyle, MD   FreeStyle Libre 3 Sensor Misc 1 each by Does not apply route every 14 (fourteen) days. Place 1 sensor on the skin every 14 days. Use to check glucose continuously Started by: [name] Ameyah Bangura, MD   glipiZIDE 5 MG tablet Commonly known as: GLUCOTROL Take 2 tablets (10 mg total) by mouth daily before breakfast AND 1 tablet (5 mg total) daily before supper. What changed: See the new instructions. Changed by: Nils Pyle, MD   Global Ease Inject Pen Needles 31G X 5 MM Misc Generic drug: Insulin Pen Needle Use daily as directed Dx E11.69   metFORMIN 1000 MG tablet Commonly known as: GLUCOPHAGE Take 1 tablet (1,000 mg total) by mouth 2 (two) times daily with a meal.   OneTouch Delica Plus Lancet33G Misc 4 (four) times daily.   OneTouch Ultra test strip Generic drug: glucose blood Test BS 4 times daily Dx E11.69   Rybelsus 14 MG Tabs Generic drug: Semaglutide Take 1 tablet (14 mg total) by mouth daily.   temazepam 15 MG capsule Commonly known as: RESTORIL TAKE ONE CAPSULE BY MOUTH AT BEDTIME AS NEEDED What changed:  how much to take how to take this when to take this reasons to take this additional instructions   Tresiba FlexTouch 100 UNIT/ML FlexTouch Pen Generic drug: insulin degludec Inject 10 Units into the skin daily. What changed: how much to take         Objective:   BP 136/70   Pulse 65   Temp 99.1 F (37.3 C)   Ht 5\' 10"  (1.778 m)   Wt 194 lb 9.6 oz (88.3 kg)   SpO2 96%   BMI 27.92 kg/m   Wt Readings from Last 3 Encounters:  11/23/22 194 lb 9.6 oz (88.3 kg)  10/29/22 191 lb 3.2 oz (86.7 kg)  08/25/22 182 lb 3.2 oz (82.6 kg)    Physical Exam Vitals and nursing note reviewed.  Constitutional:      General: He is not in acute distress.    Appearance: He is well-developed. He is not diaphoretic.  Eyes:     General: No scleral icterus.    Conjunctiva/sclera: Conjunctivae normal.  Neck:      Thyroid: No thyromegaly.  Cardiovascular:     Rate and Rhythm: Normal rate and regular rhythm.     Heart sounds: Normal heart sounds. No murmur heard. Pulmonary:     Effort: Pulmonary effort is normal. No respiratory distress.     Breath sounds: Normal breath sounds. No wheezing.  Musculoskeletal:        General: Normal range of motion.     Cervical back: Neck supple.  Lymphadenopathy:     Cervical: No cervical adenopathy.  Skin:    General: Skin is warm and dry.     Findings: No rash.  Neurological:  Mental Status: He is alert and oriented to person, place, and time.     Coordination: Coordination normal.  Psychiatric:        Behavior: Behavior normal.       Assessment & Plan:   Problem List Items Addressed This Visit       Cardiovascular and Mediastinum   Essential hypertension - Primary   Relevant Medications   atenolol (TENORMIN) 25 MG tablet   Other Relevant Orders   CBC with Differential/Platelet   CMP14+EGFR   Lipid panel   Bayer DCA Hb A1c Waived     Endocrine   Type 2 diabetes mellitus (HCC)   Relevant Medications   metFORMIN (GLUCOPHAGE) 1000 MG tablet   glipiZIDE (GLUCOTROL) 5 MG tablet   Type 2 diabetes mellitus with hyperlipidemia (HCC)   Relevant Medications   atenolol (TENORMIN) 25 MG tablet   metFORMIN (GLUCOPHAGE) 1000 MG tablet   glipiZIDE (GLUCOTROL) 5 MG tablet   Other Relevant Orders   CBC with Differential/Platelet   CMP14+EGFR   Lipid panel   Bayer DCA Hb A1c Waived     Other   Hyperlipidemia with target LDL less than 70   Relevant Medications   atenolol (TENORMIN) 25 MG tablet    This office visit was a visit to discuss patient's diabetic management and because she is out of control and using insulin 1 times daily and having to check her blood sugars 4 times daily I believe he would be a good candidate for a continuous subcutaneous glucose monitor such as freestyle libre.  He also has been getting hypoglycemic episodes at  night A1c is up at 7.8.  With his hypoglycemic episodes at night I will reduce the glipizide so that he only takes 2 in the morning and 1 in the evening instead of 2 in the evening.  He is going to try and get his stomach issues fixed so he can try to take the Rybelsus more often with the daytime to help with daytime sugars and postprandial. Follow up plan: Return in about 3 months (around 02/23/2023), or if symptoms worsen or fail to improve, for Diabetes and hypertension recheck.  Counseling provided for all of the vaccine components Orders Placed This Encounter  Procedures   CBC with Differential/Platelet   CMP14+EGFR   Lipid panel   Bayer DCA Hb A1c Waived    Arville Care, MD [facility] 11/23/2022, 3:44 PM

## 2022-11-24 LAB — CMP14+EGFR
ALT: 29 IU/L (ref 0–44)
AST: 23 IU/L (ref 0–40)
Albumin/Globulin Ratio: 2.2 (ref 1.2–2.2)
Albumin: 4.7 g/dL (ref 3.9–4.9)
Alkaline Phosphatase: 79 IU/L (ref 44–121)
BUN/Creatinine Ratio: 18 (ref 10–24)
BUN: 29 mg/dL — ABNORMAL HIGH (ref 8–27)
Bilirubin Total: 0.4 mg/dL (ref 0.0–1.2)
CO2: 20 mmol/L (ref 20–29)
Calcium: 9.7 mg/dL (ref 8.6–10.2)
Chloride: 102 mmol/L (ref 96–106)
Creatinine, Ser: 1.61 mg/dL — ABNORMAL HIGH (ref 0.76–1.27)
Globulin, Total: 2.1 g/dL (ref 1.5–4.5)
Glucose: 261 mg/dL — ABNORMAL HIGH (ref 70–99)
Potassium: 4.9 mmol/L (ref 3.5–5.2)
Sodium: 138 mmol/L (ref 134–144)
Total Protein: 6.8 g/dL (ref 6.0–8.5)
eGFR: 47 mL/min/{1.73_m2} — ABNORMAL LOW (ref >59)

## 2022-11-24 LAB — LIPID PANEL
Chol/HDL Ratio: 3.4 ratio (ref 0.0–5.0)
Cholesterol, Total: 104 mg/dL (ref 100–199)
HDL: 31 mg/dL — ABNORMAL LOW (ref >39)
LDL Chol Calc (NIH): 42 mg/dL (ref 0–99)
Triglycerides: 188 mg/dL — ABNORMAL HIGH (ref 0–149)
VLDL Cholesterol Cal: 31 mg/dL (ref 5–40)

## 2022-11-24 LAB — CBC WITH DIFFERENTIAL/PLATELET
Basophils Absolute: 0.1 10*3/uL (ref 0.0–0.2)
Basos: 1 %
EOS (ABSOLUTE): 0.2 10*3/uL (ref 0.0–0.4)
Eos: 2 %
Hematocrit: 38.5 % (ref 37.5–51.0)
Hemoglobin: 13 g/dL (ref 13.0–17.7)
Immature Grans (Abs): 0 10*3/uL (ref 0.0–0.1)
Immature Granulocytes: 0 %
Lymphocytes Absolute: 1.1 10*3/uL (ref 0.7–3.1)
Lymphs: 15 %
MCH: 33.2 pg — ABNORMAL HIGH (ref 26.6–33.0)
MCHC: 33.8 g/dL (ref 31.5–35.7)
MCV: 99 fL — ABNORMAL HIGH (ref 79–97)
Monocytes Absolute: 0.7 10*3/uL (ref 0.1–0.9)
Monocytes: 9 %
Neutrophils Absolute: 5.5 10*3/uL (ref 1.4–7.0)
Neutrophils: 73 %
Platelets: 226 10*3/uL (ref 150–450)
RBC: 3.91 x10E6/uL — ABNORMAL LOW (ref 4.14–5.80)
RDW: 12.3 % (ref 11.6–15.4)
WBC: 7.5 10*3/uL (ref 3.4–10.8)

## 2022-11-24 LAB — BAYER DCA HB A1C WAIVED: HB A1C (BAYER DCA - WAIVED): 7.8 % — ABNORMAL HIGH (ref 4.8–5.6)

## 2022-11-26 DIAGNOSIS — H903 Sensorineural hearing loss, bilateral: Secondary | ICD-10-CM | POA: Diagnosis not present

## 2022-12-04 ENCOUNTER — Ambulatory Visit (INDEPENDENT_AMBULATORY_CARE_PROVIDER_SITE_OTHER): Payer: PPO

## 2022-12-04 VITALS — Wt 194.0 lb

## 2022-12-04 DIAGNOSIS — Z0000 Encounter for general adult medical examination without abnormal findings: Secondary | ICD-10-CM | POA: Diagnosis not present

## 2022-12-04 NOTE — Progress Notes (Signed)
Subjective:   [name] [name] is a 67 y.o. male who presents for Medicare Annual/Subsequent preventive examination.  Review of Systems    I connected with  [name] [name] on 12/04/22 by a audio enabled telemedicine application and verified that I am speaking with the correct person using two identifiers.  Patient Medicare AWV questionnaire was completed by the patient on 12/03/22; I have confirmed that all information answered by patient is correct and no changes since this date.     Patient Location: Home  Provider Location: Home Office  I discussed the limitations of evaluation and management by telemedicine. The patient expressed understanding and agreed to proceed.  Cardiac Risk Factors include: advanced age (>31men, >98 women);diabetes mellitus     Objective:    Today's Vitals   12/03/22 1337 12/04/22 1314  Weight:  194 lb (88 kg)  PainSc: 6     Body mass index is 27.84 kg/m.     12/04/2022    1:22 PM 05/21/2022    9:53 PM 12/02/2021    1:27 PM 07/14/2021    9:00 AM 07/14/2021    4:55 AM 07/04/2021   11:00 PM 07/04/2021   10:48 AM  Advanced Directives  Does Patient Have a Medical Advance Directive? No No No No No No No  Would patient like information on creating a medical advance directive? No - Patient declined  Yes (MAU/Ambulatory/Procedural Areas - Information given) No - Patient declined No - Patient declined No - Patient declined No - Patient declined    Current Medications (verified) Outpatient Encounter Medications as of 12/04/2022  Medication Sig   acetaminophen (TYLENOL) 325 MG tablet Take 2 tablets (650 mg total) by mouth every 6 (six) hours as needed for mild pain (or Fever >/= 101).   amLODipine (NORVASC) 10 MG tablet Take 1 tablet (10 mg total) by mouth daily.   aspirin EC 81 MG tablet Take 1 tablet (81 mg total) by mouth daily. Swallow whole.   atenolol (TENORMIN) 25 MG tablet Take 1 tablet (25 mg total) by mouth daily.   atorvastatin (LIPITOR) 40 MG tablet  Take 1 tablet (40 mg total) by mouth daily.   blood glucose meter kit and supplies KIT Dispense based on patient and insurance preference. Use up to four times daily as directed. (FOR ICD-9 250.00, 250.01).   cholecalciferol (VITAMIN D) 1000 UNITS tablet Take 2,000 Units by mouth 2 (two) times daily.   Continuous Glucose Receiver (FREESTYLE LIBRE 3 READER) DEVI 1 each by Does not apply route 4 (four) times daily.   Continuous Glucose Sensor (FREESTYLE LIBRE 3 SENSOR) MISC 1 each by Does not apply route every 14 (fourteen) days. Place 1 sensor on the skin every 14 days. Use to check glucose continuously   diclofenac Sodium (VOLTAREN) 1 % GEL SMARTSIG:2 Gram(s) Topical 3 Times Daily PRN   glipiZIDE (GLUCOTROL) 5 MG tablet Take 2 tablets (10 mg total) by mouth daily before breakfast AND 1 tablet (5 mg total) daily before supper.   insulin degludec (TRESIBA FLEXTOUCH) 100 UNIT/ML FlexTouch Pen Inject 10 Units into the skin daily. (Patient taking differently: Inject 14 Units into the skin daily.)   Insulin Pen Needle (GLOBAL EASE INJECT PEN NEEDLES) 31G X 5 MM MISC Use daily as directed Dx E11.69   Lancets (ONETOUCH DELICA PLUS LANCET33G) MISC 4 (four) times daily.   metFORMIN (GLUCOPHAGE) 1000 MG tablet Take 1 tablet (1,000 mg total) by mouth 2 (two) times daily with a meal.   ONETOUCH ULTRA  test strip Test BS 4 times daily Dx E11.69   Probiotic Product (ALIGN PO) Take by mouth daily.   Semaglutide (RYBELSUS) 14 MG TABS Take 1 tablet (14 mg total) by mouth daily.   temazepam (RESTORIL) 15 MG capsule TAKE ONE CAPSULE BY MOUTH AT BEDTIME AS NEEDED (Patient taking differently: Take 15 mg by mouth at bedtime as needed for sleep.)   No facility-administered encounter medications on file as of 12/04/2022.    Allergies (verified) Glyxambi [empagliflozin-linagliptin], Hctz [hydrochlorothiazide], Sulfa antibiotics, and Sulfonamide derivatives   History: Past Medical History:  Diagnosis Date   Aortic  stenosis 8/09   Bicuspid aortic valve   Arthritis 6/10   hand    Atrial fibrillation (HCC)    post op only   Bile duct calculus with acute cholecystitis    CAD (coronary artery disease) 8/09   Calculus of common bile duct with acute pancreatitis 11/2016   CTS (carpal tunnel syndrome)    bilateral    Diabetes (HCC)    Diastolic dysfunction 8/09   Elevated LFTs    Fatty liver disease, nonalcoholic    History of kidney stones    Hyperlipidemia    Hypertension    Kidney stones    Nasal congestion    NIDDM (non-insulin dependent diabetes mellitus)    x15 yrs   OSA on CPAP 2006   Paresthesias in left hand 02/19/2004   Paresthesias in right hand 02/19/2004   Sinusitis    Vitamin D deficiency 06/13/09   Past Surgical History:  Procedure Laterality Date   bladder cancer - operation - 01/21/18 - Dr Berneice Heinrich  01/21/2018   CARPAL TUNNEL RELEASE     bilateral    CHOLECYSTECTOMY  11/2016   COLONOSCOPY N/A 09/22/2018   Procedure: COLONOSCOPY;  Surgeon: Malissa Hippo, MD;  Location: [facility];  Service: Endoscopy;  Laterality: N/A;  1:00   CORONARY ANGIOPLASTY WITH STENT PLACEMENT     to rt coronary atery (Dr. Degert-cardiologist)   CYSTOSCOPY WITH RETROGRADE PYELOGRAM, URETEROSCOPY AND STENT PLACEMENT Left 03/07/2018   Procedure: CYSTOSCOPY WITH RETROGRADE PYELOGRAM,  AND STENT PLACEMENT;  Surgeon: Sebastian Ache, MD;  Location: [facility];  Service: Urology;  Laterality: Left;   ERCP N/A 11/24/2016   Procedure: ENDOSCOPIC RETROGRADE CHOLANGIOPANCREATOGRAPHY (ERCP);  Surgeon: Malissa Hippo, MD;  Location: [facility];  Service: Endoscopy;  Laterality: N/A;   EXTRACORPOREAL SHOCK WAVE LITHOTRIPSY Left 03/17/2018   Procedure: LEFT EXTRACORPOREAL SHOCK WAVE LITHOTRIPSY (ESWL);  Surgeon: Bjorn Pippin, MD;  Location: [facility];  Service: Urology;  Laterality: Left;   REMOVAL OF STONES N/A 11/24/2016   Procedure: REMOVAL OF STONES;  Surgeon: Malissa Hippo, MD;  Location: [facility];  Service:  Endoscopy;  Laterality: N/A;   SPHINCTEROTOMY N/A 11/24/2016   Procedure: SPHINCTEROTOMY;  Surgeon: Malissa Hippo, MD;  Location: [facility];  Service: Endoscopy;  Laterality: N/A;   TISSUE AORTIC VALVE REPLACEMENT     2012   Family History  Problem Relation Age of Onset   Hearing loss Mother    Alzheimer's disease Mother    Hearing loss Father    Diabetes Father    Diabetes Other        Family History    Social History   Socioeconomic History   Marital status: Married    Spouse name: Aurther Loft   Number of children: 3   Years of education: Not on file   Highest education level: High school graduate  Occupational History   Occupation:  Retired  Tobacco Use   Smoking status: Former    Packs/day: 1.50    Years: 25.00    Additional pack years: 0.00    Total pack years: 37.50    Types: Cigarettes    Start date: 07/06/1974    Quit date: 07/06/2002    Years since quitting: 20.4    Passive exposure: Never   Smokeless tobacco: Never  Vaping Use   Vaping Use: Never used  Substance and Sexual Activity   Alcohol use: No    Alcohol/week: 0.0 standard drinks of alcohol   Drug use: No   Sexual activity: Not on file  Other Topics Concern   Not on file  Social History Narrative   Not on file   Social Determinants of Health   Financial Resource Strain: Low Risk  (12/04/2022)   Overall Financial Resource Strain (CARDIA)    Difficulty of Paying Living Expenses: Not hard at all  Food Insecurity: No Food Insecurity (12/04/2022)   Hunger Vital Sign    Worried About Running Out of Food in the Last Year: Never true    Ran Out of Food in the Last Year: Never true  Transportation Needs: No Transportation Needs (12/03/2022)   PRAPARE - Administrator, Civil Service (Medical): No    Lack of Transportation (Non-Medical): No  Physical Activity: Sufficiently Active (12/04/2022)   Exercise Vital Sign    Days of Exercise per Week: 7 days    Minutes of Exercise per Session: 30 min   Stress: No Stress Concern Present (12/04/2022)   [employer] of Occupational Health - Occupational Stress Questionnaire    Feeling of Stress : Not at all  Social Connections: Socially Integrated (12/04/2022)   Social Connection and Isolation Panel [NHANES]    Frequency of Communication with Friends and Family: More than three times a week    Frequency of Social Gatherings with Friends and Family: More than three times a week    Attends Religious Services: More than 4 times per year    Active Member of [employer] or Organizations: Yes    Attends Engineer, structural: More than 4 times per year    Marital Status: Married    Tobacco Counseling Counseling given: Yes   Clinical Intake:  Pre-visit preparation completed: Yes  Pain : 0-10 Pain Score: 6  Pain Location: Hip Pain Orientation: Right, Left Pain Descriptors / Indicators: Aching Pain Frequency: Intermittent     BMI - recorded: 27.84 Nutritional Status: BMI 25 -29 Overweight Nutritional Risks: None Diabetes: Yes CBG done?: No Did pt. bring in CBG monitor from home?: No  How often do you need to have someone help you when you read instructions, pamphlets, or other written materials from your doctor or pharmacy?: 2 - Rarely  Diabetic?yes  Interpreter Needed?: No  Information entered by :: Fredirick Maudlin   Activities of Daily Living    12/03/2022    1:37 PM  In your present state of health, do you have any difficulty performing the following activities:  Hearing? 1  Vision? 0  Difficulty concentrating or making decisions? 0  Walking or climbing stairs? 1  Dressing or bathing? 0  Doing errands, shopping? 0  Preparing Food and eating ? N  Using the Toilet? N  In the past six months, have you accidently leaked urine? N  Do you have problems with loss of bowel control? N  Managing your Medications? N  Managing your Finances? N  Housekeeping or managing your Housekeeping?  N    Patient Care  Team: Dettinger, [name], MD as PCP - General (Family Medicine) Wyline Mood Dorothe Pea, MD as PCP - Cardiology (Cardiology) Danella Maiers, [facility] (Pharmacist) Melina Fiddler, MD as Consulting Physician (Family Medicine)  Indicate any recent Medical Services you may have received from other than [facility] providers in the past year (date may be approximate).     Assessment:   This is a routine wellness examination for [name] [name].  Hearing/Vision screen Hearing Screening - Comments:: Wears hearing aides in both ears  Vision Screening - Comments:: Wears rx glasses - up to date with routine eye exams with  [facility]  Dietary issues and exercise activities discussed: Current Exercise Habits: Home exercise routine, Time (Minutes): 30, Frequency (Times/Week): 4, Weekly Exercise (Minutes/Week): 120, Intensity: Moderate, Exercise limited by: orthopedic condition(s)   Goals Addressed             This Visit's Progress    DIET - EAT MORE FRUITS AND VEGETABLES   On track     Depression Screen    12/04/2022    1:19 PM 12/04/2022    1:18 PM 11/23/2022    2:46 PM 08/14/2022   10:51 AM 06/03/2022    8:09 AM 05/13/2022   10:36 AM 02/05/2022    9:28 AM  PHQ 2/9 Scores  PHQ - 2 Score 0 0 1 0 0 0 0  PHQ- 9 Score 0 0 5 3 3 3 3     Fall Risk    12/03/2022    1:37 PM 11/23/2022    2:46 PM 08/14/2022   10:51 AM 06/03/2022    8:08 AM 05/13/2022   10:37 AM  Fall Risk   Falls in the past year? 0 0 0 1 1  Number falls in past yr:    0 0  Injury with Fall? 0   0 0  Risk for fall due to :     History of fall(s)  Follow up Falls prevention discussed;Falls evaluation completed   Falls prevention discussed Falls evaluation completed    FALL RISK PREVENTION PERTAINING TO THE HOME:  Any stairs in or around the home? No  If so, are there any without handrails? No  Home free of loose throw rugs in walkways, pet beds, electrical cords, etc? No  Adequate lighting in your home to reduce risk of falls? Yes    ASSISTIVE DEVICES UTILIZED TO PREVENT FALLS:  Life alert? No  Use of a cane, walker or w/c? No  Grab bars in the bathroom? No  Shower chair or bench in shower? No  Elevated toilet seat or a handicapped toilet? No   TIMED UP AND GO:  Was the test performed?  NO televisit .   Cognitive Function:    11/29/2020    2:32 PM  MMSE - Mini Mental State Exam  Orientation to time 5  Orientation to Place 5  Registration 3  Attention/ Calculation 4  Recall 3  Language- name 2 objects 2  Language- repeat 1  Language- follow 3 step command 3  Language- read & follow direction 1  Write a sentence 1  Copy design 1  Total score 29        12/04/2022    1:20 PM 12/02/2021    1:35 PM  6CIT Screen  What Year? 0 points 0 points  What month?  0 points  What time? 0 points 0 points  Count back from 20 0 points 0 points  Months in  reverse 0 points 0 points  Repeat phrase 0 points 0 points  Total Score  0 points    Immunizations Immunization History  Administered Date(s) Administered   Fluad Quad(high Dose 65+) 05/07/2021, 05/13/2022   Influenza,inj,Quad PF,6+ Mos 05/17/2013, 05/03/2014, 04/25/2015, 04/10/2016, 04/23/2017, 05/05/2018, 04/20/2019, 04/26/2020   Pneumococcal Conjugate-13 11/24/2013   Pneumococcal Polysaccharide-23 04/20/2019   Td 11/11/2009   Tdap 11/11/2009, 01/25/2020   Unspecified SARS-COV-2 Vaccination 09/13/2019, 10/09/2019, 06/24/2020   Zoster Recombinat (Shingrix) 10/23/2021, 02/05/2022   Zoster, Live 03/03/2011    TDAP status: Up to date  Flu Vaccine status: Up to date  Pneumococcal vaccine status: Up to date  Covid-19 vaccine status: Declined, Education has been provided regarding the importance of this vaccine but patient still declined. Advised may receive this vaccine at local pharmacy or Health Dept.or vaccine clinic. Aware to provide a copy of the vaccination record if obtained from local pharmacy or Health Dept. Verbalized acceptance and  understanding.  Qualifies for Shingles Vaccine? Yes   Zostavax completed No   Shingrix Completed?: Yes  Screening Tests Health Maintenance  Topic Date Due   COLON CANCER SCREENING ANNUAL FOBT  06/06/2022   COVID-19 Vaccine (4 - 2023-24 season) 12/09/2022 (Originally 03/06/2022)   INFLUENZA VACCINE  02/04/2023   Diabetic kidney evaluation - Urine ACR  02/06/2023   FOOT EXAM  02/06/2023   HEMOGLOBIN A1C  05/26/2023   OPHTHALMOLOGY EXAM  08/14/2023   Diabetic kidney evaluation - eGFR measurement  11/23/2023   Medicare Annual Wellness (AWV)  12/04/2023   Pneumonia Vaccine 19+ Years old (3 of 3 - PPSV23 or PCV20) 04/19/2024   Colonoscopy  09/21/2028   DTaP/Tdap/Td (4 - Td or Tdap) 01/24/2030   Hepatitis C Screening  Completed   Zoster Vaccines- Shingrix  Completed   HPV VACCINES  Aged Out    Health Maintenance  Health Maintenance Due  Topic Date Due   COLON CANCER SCREENING ANNUAL FOBT  06/06/2022    Colorectal cancer screening: Type of screening: FOBT/FIT. Completed 1202/22. Repeat every 1 years  Lung Cancer Screening: (Low Dose CT Chest recommended if Age 53-80 years, 30 pack-year currently smoking OR have quit w/in 15years.) does qualify.   Lung Cancer Screening Referral: yes  Additional Screening:  Hepatitis C Screening: does qualify; Completed 01/26/20  Vision Screening: Recommended annual ophthalmology exams for early detection of glaucoma and other disorders of the eye. Is the patient up to date with their annual eye exam?  Yes  Who is the provider or what is the name of the office in which the patient attends annual eye exams? Dr Earlene Plater If pt is not established with a provider, would they like to be referred to a provider to establish care? No .   Dental Screening: Recommended annual dental exams for proper oral hygiene  Community Resource Referral / Chronic Care Management: CRR required this visit?  No   CCM required this visit?  No      Plan:     I have  personally reviewed and noted the following in the patient's chart:   Medical and social history Use of alcohol, tobacco or illicit drugs  Current medications and supplements including opioid prescriptions. Patient is not currently taking opioid prescriptions. Functional ability and status Nutritional status Physical activity Advanced directives List of other physicians Hospitalizations, surgeries, and ER visits in previous 12 months Vitals Screenings to include cognitive, depression, and falls Referrals and appointments  In addition, I have reviewed and discussed with patient certain preventive protocols, quality  metrics, and best practice recommendations. A written personalized care plan for preventive services as well as general preventive health recommendations were provided to patient.     Annabell Sabal, CMA   12/04/2022   Nurse Notes: none

## 2022-12-04 NOTE — Patient Instructions (Signed)
[name] [name] , Thank you for taking time to come for your Medicare Wellness Visit. I appreciate your ongoing commitment to your health goals. Please review the following plan we discussed and let me know if I can assist you in the future.   These are the goals we discussed:  Goals       Patient Stated     T2DM-PHARMD (pt-stated)      Current Barriers:  Unable to independently afford treatment regimen Unable to achieve control of T2DM   Pharmacist Clinical Goal(s):  Over the next 90 days, patient will verbalize ability to afford treatment regimen achieve control of T2DM as evidenced by GOAL A1C<7% through collaboration with PharmD and provider.   Interventions: 1:1 collaboration with Dettinger, [name], MD regarding development and update of comprehensive plan of care as evidenced by provider attestation and co-signature Inter-disciplinary care team collaboration (see longitudinal plan of care) Comprehensive medication review performed; medication list updated in electronic medical record  Diabetes: Controlled (A1C 7.9-->6.6%, GFR 53-->48); current treatment: RYBELSUS 14MG , TRESIBA 10 UNITS, glipizide, metformin Denies personal and family history of Medullary thyroid cancer (MTC) DISCONTINUED FARGIXA CONTINUE RYBELSUS, TRESIBA, METFORMIN, GLIPIZIDE  Would like to transition paitnet to ozempic (from rybelsus) and minimize use of glipizide or insulin Insulin requirements have decreased Received notification from [employer] regarding RE-ENROLLMENT approval for TRESIBA & RYBELSUS 14MG . Patient assistance approved from 07/06/21 to 07/05/22. Phone: [phone] Current glucose readings: fasting glucose: <150, post prandial glucose: N/A Denies hypoglycemic/hyperglycemic symptoms Discussed meal planning options and Plate method for healthy eating Avoid sugary drinks and desserts Incorporate balanced protein, non starchy veggies, 1 serving of carbohydrate with each meal Increase water  intake Increase physical activity as able Current exercise: ENCOURAGED  Educated on RYBELSUS /GLP1; LDL at goal<70, continue current meds Assessed patient finances. Received notification from [employer] regarding RE-ENROLLMENT approval for TRESIBA & RYBELSUS 14MG . Patient assistance approved from 07/06/21 to 07/05/22.     Atrial Fibrillation: Diagnosed in April of 2023 On eliquis for anticoagulation; diltiazem for rate control, remains on atenolol Difficulty affording eliquis--will submit application for [employer] patient assistance  Patient Goals/Self-Care Activities Over the next 90 days, patient will:  - take medications as prescribed check glucose DAILY (FASTING), document, and provide at future appointments  Follow Up Plan: Telephone follow up appointment with care management team member scheduled for: 3 MONTHs       Other     DIET - EAT MORE FRUITS AND VEGETABLES      Exercise 150 min/wk Moderate Activity        This is a list of the screening recommended for you and due dates:  Health Maintenance  Topic Date Due   Stool Blood Test  06/06/2022   COVID-19 Vaccine (4 - 2023-24 season) 12/09/2022*   Flu Shot  02/04/2023   Yearly kidney health urinalysis for diabetes  02/06/2023   Complete foot exam   02/06/2023   Hemoglobin A1C  05/26/2023   Eye exam for diabetics  08/14/2023   Yearly kidney function blood test for diabetes  11/23/2023   Medicare Annual Wellness Visit  12/04/2023   Pneumonia Vaccine (3 of 3 - PPSV23 or PCV20) 04/19/2024   Colon Cancer Screening  09/21/2028   DTaP/Tdap/Td vaccine (4 - Td or Tdap) 01/24/2030   Hepatitis C Screening  Completed   Zoster (Shingles) Vaccine  Completed   HPV Vaccine  Aged Out  *Topic was postponed. The date shown is not the original due date.    Advanced  directives: Advance directive discussed with you today. Even though you declined this today, please call our office should you change your mind, and we can give you the proper  paperwork for you to fill out.   Conditions/risks identified: Diabetes Mellitus and Nutrition, Adult When you have diabetes, or diabetes mellitus, it is very important to have healthy eating habits because your blood sugar (glucose) levels are greatly affected by what you eat and drink. Eating healthy foods in the right amounts, at about the same times every day, can help you: Manage your blood glucose. Lower your risk of heart disease. Improve your blood pressure. Reach or maintain a healthy weight. What can affect my meal plan? Every person with diabetes is different, and each person has different needs for a meal plan. Your health care provider may recommend that you work with a dietitian to make a meal plan that is best for you. Your meal plan may vary depending on factors such as: The calories you need. The medicines you take. Your weight. Your blood glucose, blood pressure, and cholesterol levels. Your activity level. Other health conditions you have, such as heart or kidney disease. How do carbohydrates affect me? Carbohydrates, also called carbs, affect your blood glucose level more than any other type of food. Eating carbs raises the amount of glucose in your blood. It is important to know how many carbs you can safely have in each meal. This is different for every person. Your dietitian can help you calculate how many carbs you should have at each meal and for each snack. How does alcohol affect me? Alcohol can cause a decrease in blood glucose (hypoglycemia), especially if you use insulin or take certain diabetes medicines by mouth. Hypoglycemia can be a life-threatening condition. Symptoms of hypoglycemia, such as sleepiness, dizziness, and confusion, are similar to symptoms of having too much alcohol. Do not drink alcohol if: Your health care provider tells you not to drink. You are pregnant, may be pregnant, or are planning to become pregnant. If you drink alcohol: Limit how  much you have to: 0-1 drink a day for women. 0-2 drinks a day for men. Know how much alcohol is in your drink. In the [place] one drink equals one 12 oz bottle of beer (355 mL), one 5 oz glass of wine (148 mL), or one 1 oz glass of hard liquor (44 mL). Keep yourself hydrated with water, diet soda, or unsweetened iced tea. Keep in mind that regular soda, juice, and other mixers may contain a lot of sugar and must be counted as carbs. What are tips for following this plan?  Reading food labels Start by checking the serving size on the Nutrition Facts label of packaged foods and drinks. The number of calories and the amount of carbs, fats, and other nutrients listed on the label are based on one serving of the item. Many items contain more than one serving per package. Check the total grams (g) of carbs in one serving. Check the number of grams of saturated fats and trans fats in one serving. Choose foods that have a low amount or none of these fats. Check the number of milligrams (mg) of salt (sodium) in one serving. Most people should limit total sodium intake to less than 2,300 mg per day. Always check the nutrition information of foods labeled as "low-fat" or "nonfat." These foods may be higher in added sugar or refined carbs and should be avoided. Talk to your dietitian to identify your daily  goals for nutrients listed on the label. Shopping Avoid buying canned, pre-made, or processed foods. These foods tend to be high in fat, sodium, and added sugar. Shop around the outside edge of the grocery store. This is where you will most often find fresh fruits and vegetables, bulk grains, fresh meats, and fresh dairy products. Cooking Use low-heat cooking methods, such as baking, instead of high-heat cooking methods, such as deep frying. Cook using healthy oils, such as olive, canola, or sunflower oil. Avoid cooking with butter, cream, or high-fat meats. Meal planning Eat meals and snacks regularly,  preferably at the same times every day. Avoid going long periods of time without eating. Eat foods that are high in fiber, such as fresh fruits, vegetables, beans, and whole grains. Eat 4-6 oz (112-168 g) of lean protein each day, such as lean meat, chicken, fish, eggs, or tofu. One ounce (oz) (28 g) of lean protein is equal to: 1 oz (28 g) of meat, chicken, or fish. 1 egg.  cup (62 g) of tofu. Eat some foods each day that contain healthy fats, such as avocado, nuts, seeds, and fish. What foods should I eat? Fruits Berries. Apples. Oranges. Peaches. Apricots. Plums. Grapes. Mangoes. Papayas. Pomegranates. Kiwi. Cherries. Vegetables Leafy greens, including lettuce, spinach, kale, chard, collard greens, mustard greens, and cabbage. Beets. Cauliflower. Broccoli. Carrots. Green beans. Tomatoes. Peppers. Onions. Cucumbers. Brussels sprouts. Grains Whole grains, such as whole-wheat or whole-grain bread, crackers, tortillas, cereal, and pasta. Unsweetened oatmeal. Quinoa. Brown or wild rice. Meats and other proteins Seafood. Poultry without skin. Lean cuts of poultry and beef. Tofu. Nuts. Seeds. Dairy Low-fat or fat-free dairy products such as milk, yogurt, and cheese. The items listed above may not be a complete list of foods and beverages you can eat and drink. Contact a dietitian for more information. What foods should I avoid? Fruits Fruits canned with syrup. Vegetables Canned vegetables. Frozen vegetables with butter or cream sauce. Grains Refined white flour and flour products such as bread, pasta, snack foods, and cereals. Avoid all processed foods. Meats and other proteins Fatty cuts of meat. Poultry with skin. Breaded or fried meats. Processed meat. Avoid saturated fats. Dairy Full-fat yogurt, cheese, or milk. Beverages Sweetened drinks, such as soda or iced tea. The items listed above may not be a complete list of foods and beverages you should avoid. Contact a dietitian for more  information. Questions to ask a health care provider Do I need to meet with a certified diabetes care and education specialist? Do I need to meet with a dietitian? What number can I call if I have questions? When are the best times to check my blood glucose? Where to find more information: American Diabetes Association: diabetes.org Academy of Nutrition and Dietetics: eatright.[employer] of Diabetes and Digestive and Kidney Diseases: StageSync.si Association of Diabetes Care & Education Specialists: diabeteseducator.org Summary It is important to have healthy eating habits because your blood sugar (glucose) levels are greatly affected by what you eat and drink. It is important to use alcohol carefully. A healthy meal plan will help you manage your blood glucose and lower your risk of heart disease. Your health care provider may recommend that you work with a dietitian to make a meal plan that is best for you. This information is not intended to replace advice given to you by your health care provider. Make sure you discuss any questions you have with your health care provider. Document Revised: 01/24/2020 Document Reviewed: 01/24/2020 Elsevier Patient Education  2024 Elsevier Inc.   Next appointment: Follow up in one year for your annual wellness visit. 12/04/22  Preventive Care 65 Years and Older, Male  Preventive care refers to lifestyle choices and visits with your health care provider that can promote health and wellness. What does preventive care include? A yearly physical exam. This is also called an annual well check. Dental exams once or twice a year. Routine eye exams. Ask your health care provider how often you should have your eyes checked. Personal lifestyle choices, including: Daily care of your teeth and gums. Regular physical activity. Eating a healthy diet. Avoiding tobacco and drug use. Limiting alcohol use. Practicing safe sex. Taking low doses of  aspirin every day. Taking vitamin and mineral supplements as recommended by your health care provider. What happens during an annual well check? The services and screenings done by your health care provider during your annual well check will depend on your age, overall health, lifestyle risk factors, and family history of disease. Counseling  Your health care provider may ask you questions about your: Alcohol use. Tobacco use. Drug use. Emotional well-being. Home and relationship well-being. Sexual activity. Eating habits. History of falls. Memory and ability to understand (cognition). Work and work Astronomer. Screening  You may have the following tests or measurements: Height, weight, and BMI. Blood pressure. Lipid and cholesterol levels. These may be checked every 5 years, or more frequently if you are over 72 years old. Skin check. Lung cancer screening. You may have this screening every year starting at age 30 if you have a 30-pack-year history of smoking and currently smoke or have quit within the past 15 years. Fecal occult blood test (FOBT) of the stool. You may have this test every year starting at age 8. Flexible sigmoidoscopy or colonoscopy. You may have a sigmoidoscopy every 5 years or a colonoscopy every 10 years starting at age 64. Prostate cancer screening. Recommendations will vary depending on your family history and other risks. Hepatitis C blood test. Hepatitis B blood test. Sexually transmitted disease (STD) testing. Diabetes screening. This is done by checking your blood sugar (glucose) after you have not eaten for a while (fasting). You may have this done every 1-3 years. Abdominal aortic aneurysm (AAA) screening. You may need this if you are a current or former smoker. Osteoporosis. You may be screened starting at age 23 if you are at high risk. Talk with your health care provider about your test results, treatment options, and if necessary, the need for more  tests. Vaccines  Your health care provider may recommend certain vaccines, such as: Influenza vaccine. This is recommended every year. Tetanus, diphtheria, and acellular pertussis (Tdap, Td) vaccine. You may need a Td booster every 10 years. Zoster vaccine. You may need this after age 52. Pneumococcal 13-valent conjugate (PCV13) vaccine. One dose is recommended after age 63. Pneumococcal polysaccharide (PPSV23) vaccine. One dose is recommended after age 62. Talk to your health care provider about which screenings and vaccines you need and how often you need them. This information is not intended to replace advice given to you by your health care provider. Make sure you discuss any questions you have with your health care provider. Document Released: 07/19/2015 Document Revised: 03/11/2016 Document Reviewed: 04/23/2015 Elsevier Interactive Patient Education  2017 [employer].  Fall Prevention in the Home Falls can cause injuries. They can happen to people of all ages. There are many things you can do to make your home safe and to help  prevent falls. What can I do on the outside of my home? Regularly fix the edges of walkways and driveways and fix any cracks. Remove anything that might make you trip as you walk through a door, such as a raised step or threshold. Trim any bushes or trees on the path to your home. Use bright outdoor lighting. Clear any walking paths of anything that might make someone trip, such as rocks or tools. Regularly check to see if handrails are loose or broken. Make sure that both sides of any steps have handrails. Any raised decks and porches should have guardrails on the edges. Have any leaves, snow, or ice cleared regularly. Use sand or salt on walking paths during winter. Clean up any spills in your garage right away. This includes oil or grease spills. What can I do in the bathroom? Use night lights. Install grab bars by the toilet and in the tub and shower.  Do not use towel bars as grab bars. Use non-skid mats or decals in the tub or shower. If you need to sit down in the shower, use a plastic, non-slip stool. Keep the floor dry. Clean up any water that spills on the floor as soon as it happens. Remove soap buildup in the tub or shower regularly. Attach bath mats securely with double-sided non-slip rug tape. Do not have throw rugs and other things on the floor that can make you trip. What can I do in the bedroom? Use night lights. Make sure that you have a light by your bed that is easy to reach. Do not use any sheets or blankets that are too big for your bed. They should not hang down onto the floor. Have a firm chair that has side arms. You can use this for support while you get dressed. Do not have throw rugs and other things on the floor that can make you trip. What can I do in the kitchen? Clean up any spills right away. Avoid walking on wet floors. Keep items that you use a lot in easy-to-reach places. If you need to reach something above you, use a strong step stool that has a grab bar. Keep electrical cords out of the way. Do not use floor polish or wax that makes floors slippery. If you must use wax, use non-skid floor wax. Do not have throw rugs and other things on the floor that can make you trip. What can I do with my stairs? Do not leave any items on the stairs. Make sure that there are handrails on both sides of the stairs and use them. Fix handrails that are broken or loose. Make sure that handrails are as long as the stairways. Check any carpeting to make sure that it is firmly attached to the stairs. Fix any carpet that is loose or worn. Avoid having throw rugs at the top or bottom of the stairs. If you do have throw rugs, attach them to the floor with carpet tape. Make sure that you have a light switch at the top of the stairs and the bottom of the stairs. If you do not have them, ask someone to add them for you. What else  can I do to help prevent falls? Wear shoes that: Do not have high heels. Have rubber bottoms. Are comfortable and fit you well. Are closed at the toe. Do not wear sandals. If you use a stepladder: Make sure that it is fully opened. Do not climb a closed stepladder. Make sure that  both sides of the stepladder are locked into place. Ask someone to hold it for you, if possible. Clearly mark and make sure that you can see: Any grab bars or handrails. First and last steps. Where the edge of each step is. Use tools that help you move around (mobility aids) if they are needed. These include: Canes. Walkers. Scooters. Crutches. Turn on the lights when you go into a dark area. Replace any light bulbs as soon as they burn out. Set up your furniture so you have a clear path. Avoid moving your furniture around. If any of your floors are uneven, fix them. If there are any pets around you, be aware of where they are. Review your medicines with your doctor. Some medicines can make you feel dizzy. This can increase your chance of falling. Ask your doctor what other things that you can do to help prevent falls. This information is not intended to replace advice given to you by your health care provider. Make sure you discuss any questions you have with your health care provider. Document Released: 04/18/2009 Document Revised: 11/28/2015 Document Reviewed: 07/27/2014 Elsevier Interactive Patient Education  2017 [employer].

## 2022-12-07 DIAGNOSIS — M4807 Spinal stenosis, lumbosacral region: Secondary | ICD-10-CM | POA: Diagnosis not present

## 2022-12-07 DIAGNOSIS — M1611 Unilateral primary osteoarthritis, right hip: Secondary | ICD-10-CM | POA: Diagnosis not present

## 2022-12-08 ENCOUNTER — Other Ambulatory Visit (HOSPITAL_COMMUNITY): Payer: Self-pay | Admitting: Sports Medicine

## 2022-12-08 DIAGNOSIS — H903 Sensorineural hearing loss, bilateral: Secondary | ICD-10-CM | POA: Diagnosis not present

## 2022-12-08 DIAGNOSIS — M25551 Pain in right hip: Secondary | ICD-10-CM

## 2022-12-15 DIAGNOSIS — K638211 Small intestinal bacterial overgrowth, hydrogen-subtype: Secondary | ICD-10-CM | POA: Diagnosis not present

## 2022-12-16 ENCOUNTER — Ambulatory Visit (HOSPITAL_COMMUNITY)
Admission: RE | Admit: 2022-12-16 | Discharge: 2022-12-16 | Disposition: A | Discharge status: Home / Self Care | Payer: PPO | Source: Ambulatory Visit | Attending: Sports Medicine | Admitting: Sports Medicine

## 2022-12-16 ENCOUNTER — Encounter (HOSPITAL_COMMUNITY): Payer: Self-pay

## 2022-12-16 DIAGNOSIS — M25551 Pain in right hip: Secondary | ICD-10-CM | POA: Diagnosis not present

## 2022-12-16 MED ORDER — TRIAMCINOLONE ACETONIDE 40 MG/ML IJ SUSP
INTRAMUSCULAR | Status: AC
  Filled 2022-12-16: qty 1

## 2022-12-16 MED ORDER — IOHEXOL 180 MG/ML  SOLN
10.0000 mL | Freq: Once | INTRAMUSCULAR | Status: AC | PRN
  Administered 2022-12-16: 4 mL via INTRA_ARTICULAR

## 2022-12-16 MED ORDER — TRIAMCINOLONE ACETONIDE 40 MG/ML IJ SUSP (RADIOLOGY)
40.0000 mg | Freq: Once | INTRAMUSCULAR | Status: AC
  Administered 2022-12-16: 40 mg via INTRA_ARTICULAR

## 2022-12-16 MED ORDER — BUPIVACAINE HCL (PF) 0.5 % IJ SOLN
5.0000 mL | Freq: Once | INTRAMUSCULAR | Status: AC
  Administered 2022-12-16: 4 mL via INTRA_ARTICULAR

## 2022-12-16 MED ORDER — LIDOCAINE HCL (PF) 1 % IJ SOLN
5.0000 mL | Freq: Once | INTRAMUSCULAR | Status: AC
  Administered 2022-12-16: 5 mL via INTRADERMAL

## 2022-12-16 MED ORDER — LIDOCAINE HCL (PF) 1 % IJ SOLN
INTRAMUSCULAR | Status: AC
  Filled 2022-12-16: qty 5

## 2022-12-16 MED ORDER — BUPIVACAINE HCL (PF) 0.5 % IJ SOLN
INTRAMUSCULAR | Status: AC
  Filled 2022-12-16: qty 30

## 2022-12-21 ENCOUNTER — Encounter (INDEPENDENT_AMBULATORY_CARE_PROVIDER_SITE_OTHER): Payer: Self-pay

## 2022-12-21 ENCOUNTER — Telehealth: Payer: Self-pay | Admitting: Gastroenterology

## 2022-12-21 DIAGNOSIS — K638219 Small intestinal bacterial overgrowth, unspecified: Secondary | ICD-10-CM | POA: Insufficient documentation

## 2022-12-21 DIAGNOSIS — K580 Irritable bowel syndrome with diarrhea: Secondary | ICD-10-CM

## 2022-12-21 MED ORDER — RIFAXIMIN 550 MG PO TABS
550.0000 mg | ORAL_TABLET | Freq: Three times a day (TID) | ORAL | 0 refills | Status: DC
Start: 2022-12-21 — End: 2022-12-22

## 2022-12-21 NOTE — Telephone Encounter (Signed)
Patient made aware that we have sent Xifaxan to [facility] and that it may require an Authorization.

## 2022-12-21 NOTE — Telephone Encounter (Signed)
Received the results of the most recent SIBO testing which were positive for SIBO.  Will need to proceed with a 2-week course of Xifaxan.  Hi Crystal,  Can you please call the patient and tell the patient the testing came back abnormal for SIBO?  I will send the Xifaxan to his pharmacy.  Thanks,  [name], MD Gastroenterology and Hepatology [facility]

## 2022-12-22 ENCOUNTER — Other Ambulatory Visit: Payer: Self-pay | Admitting: Gastroenterology

## 2022-12-22 ENCOUNTER — Telehealth (INDEPENDENT_AMBULATORY_CARE_PROVIDER_SITE_OTHER): Payer: Self-pay

## 2022-12-22 DIAGNOSIS — K638219 Small intestinal bacterial overgrowth, unspecified: Secondary | ICD-10-CM

## 2022-12-22 MED ORDER — METRONIDAZOLE 500 MG PO TABS
500.0000 mg | ORAL_TABLET | Freq: Two times a day (BID) | ORAL | 0 refills | Status: AC
Start: 2022-12-22 — End: 2023-01-01

## 2022-12-22 MED ORDER — CIPROFLOXACIN HCL 500 MG PO TABS
500.0000 mg | ORAL_TABLET | Freq: Two times a day (BID) | ORAL | 0 refills | Status: AC
Start: 2022-12-22 — End: 2023-01-01

## 2022-12-22 NOTE — Telephone Encounter (Signed)
Patient made aware that the medication was approved by insurance. Approval is in the media tab of patient's chart.

## 2022-12-22 NOTE — Telephone Encounter (Signed)
Xifaxan 550 # 42 pill too expensive. Co Pay $692.00, can't afford. Is there anything else that he can use? FYI: We do not have enough samples to give him here at the office. If there is an alternative patient will need it sent to [facility]. Thanks

## 2022-12-22 NOTE — Telephone Encounter (Signed)
I made the patient aware that the two medications were sent in the [facility].

## 2022-12-22 NOTE — Telephone Encounter (Signed)
Sent a 10 day course of ciprofloxacin and Flagyl Thanks

## 2022-12-28 DIAGNOSIS — H40013 Open angle with borderline findings, low risk, bilateral: Secondary | ICD-10-CM | POA: Diagnosis not present

## 2023-01-05 DIAGNOSIS — M48062 Spinal stenosis, lumbar region with neurogenic claudication: Secondary | ICD-10-CM | POA: Diagnosis not present

## 2023-01-18 DIAGNOSIS — M1611 Unilateral primary osteoarthritis, right hip: Secondary | ICD-10-CM | POA: Diagnosis not present

## 2023-01-18 DIAGNOSIS — M4807 Spinal stenosis, lumbosacral region: Secondary | ICD-10-CM | POA: Diagnosis not present

## 2023-01-26 ENCOUNTER — Telehealth (INDEPENDENT_AMBULATORY_CARE_PROVIDER_SITE_OTHER): Payer: Self-pay | Admitting: *Deleted

## 2023-01-26 NOTE — Telephone Encounter (Signed)
Pt states since finishing treatment for sibo in June he has been sick 4 more times. For the past 3 days he has had terrible gas, vomited twice in the past 3 days, diarrhea last night and before then last was constipated. Last bm before diarrhea last night was 3 days prior. He has nausea. He is asking for another rx for metronidazole.   [phone]

## 2023-01-27 ENCOUNTER — Other Ambulatory Visit (INDEPENDENT_AMBULATORY_CARE_PROVIDER_SITE_OTHER): Payer: Self-pay | Admitting: Gastroenterology

## 2023-01-27 DIAGNOSIS — K580 Irritable bowel syndrome with diarrhea: Secondary | ICD-10-CM

## 2023-01-27 MED ORDER — RIFAXIMIN 550 MG PO TABS
550.0000 mg | ORAL_TABLET | Freq: Three times a day (TID) | ORAL | 0 refills | Status: AC
Start: 2023-01-27 — End: 2023-02-10

## 2023-01-27 NOTE — Telephone Encounter (Signed)
I spoke to patient today, he reported having worsening abdominal pain episodes, bloating, nausea and vomiting since Saturday.  States that nausea is better today.  Reported having total of 4 bouts of similar symptoms since he finished his course of ciprofloxacin and metronidazole recently.  We discussed that he ideally should have gotten treatment with Xifaxan but he wanted another antibiotic option.  He is agreeable to take Xifaxan for 2 weeks.  Will send this prescription.

## 2023-01-28 NOTE — Telephone Encounter (Signed)
PA required for xifaxan 550mg .  Completed through cover my meds and approved. Approval letter faxed to [facility]. Approval dates 01/27/23 - 02/10/23.

## 2023-02-12 ENCOUNTER — Encounter: Payer: Self-pay | Admitting: Cardiology

## 2023-02-12 ENCOUNTER — Other Ambulatory Visit: Payer: Self-pay

## 2023-02-12 ENCOUNTER — Ambulatory Visit: Payer: PPO | Attending: Cardiology | Admitting: Cardiology

## 2023-02-12 VITALS — BP 122/72 | HR 70 | Ht 70.0 in | Wt 184.8 lb

## 2023-02-12 DIAGNOSIS — I6529 Occlusion and stenosis of unspecified carotid artery: Secondary | ICD-10-CM

## 2023-02-12 DIAGNOSIS — I10 Essential (primary) hypertension: Secondary | ICD-10-CM | POA: Diagnosis not present

## 2023-02-12 DIAGNOSIS — Z952 Presence of prosthetic heart valve: Secondary | ICD-10-CM | POA: Diagnosis not present

## 2023-02-12 DIAGNOSIS — I4891 Unspecified atrial fibrillation: Secondary | ICD-10-CM

## 2023-02-12 DIAGNOSIS — E782 Mixed hyperlipidemia: Secondary | ICD-10-CM

## 2023-02-12 NOTE — Progress Notes (Signed)
Clinical Summary [name] [name] is a 67 y.o.male seen today for follow up of the following medical problems      1. AVR - history of bicuspid AV, s/p bioprosthetic AVR in 2012 Jan 2023 echo normal AVR - 05/2022 echo normal AVR   - denies any SOB/DOE. Rides stationary bike 30 min a day without symptoms.    2. Chest pain - no recent issues     3. Ascending aortic aneurysm - mild aortic root dilatation at 4.1 to 4.2 cm by last MRI 10/2013, likely related to hx of bicuspid AV and potential coexisting aortopathy - CTA 10/2014 4.1 cm ascending aortic aneurysm.   - CTA 11/2016 stable 4 cm aneurysm   10/2017 CTA Chest/Abd/Pelvis Sovah (in epic labeled as CT A/P): 4 cm aneurysm - no recent chest pain   01/2019 CTA stable 3.7 cm(reported overestimated by prior imaging) 01/2020 CTA 3.9 cm acending aorta, overall stable 06/2021 CT PE: no aneurysm   3. HTN - on higher dose of lisinopril had symptomatic low bp's   - not checking at home       4. OSA - not compliant with CPAP due to discomfort. Has not been intrested in retyring     5. Hyperlipidemia     05/2022 TC 79 TG 63 HDL 25 LDL 41 11/2022 TC 104 TG 188 HDL 31 LDL 42. HgbA1c 7.8 last check   6. DM2 - followed by pcp       7. Pulmonary  Nodules - have been stable by imaging, monitored along with his aneurysm with his annual CT chests   8. Carotid stenosis - followed by vascular 09/2021 carotid US: mod bilateral disease   05/2022 RICA 60-79%, RICA 40-59%    9. COVID pneumonia 06/2021 - severeal day hospital admission, discharged on O2. He is now off.    10. Afib, isolated - new diagnosis during Jan 2023 admission - had just been discharged for severe case of COVID pneumonia, readmitted few days later with dehydration and AKI, found to be in afib with RVR.  - he asymptomatic - no bleeding on eliqus - dizziness on dilt 120.     02/2022 30 day monitor: no afib - no specific palpitations.   11. CKD IIIA Past  Medical History:  Diagnosis Date   Aortic stenosis 8/09   Bicuspid aortic valve   Arthritis 6/10   hand    Atrial fibrillation (HCC)    post op only   Bile duct calculus with acute cholecystitis    CAD (coronary artery disease) 8/09   Calculus of common bile duct with acute pancreatitis 11/2016   CTS (carpal tunnel syndrome)    bilateral    Diabetes (HCC)    Diastolic dysfunction 8/09   Elevated LFTs    Fatty liver disease, nonalcoholic    History of kidney stones    Hyperlipidemia    Hypertension    Kidney stones    Nasal congestion    NIDDM (non-insulin dependent diabetes mellitus)    x15 yrs   OSA on CPAP 2006   Paresthesias in left hand 02/19/2004   Paresthesias in right hand 02/19/2004   Sinusitis    Vitamin D deficiency 06/13/09     Allergies  Allergen Reactions   Glyxambi [Empagliflozin-Linagliptin] Diarrhea and Itching    He was able to take Jardiance alone with no side effects so it was the Tradjenta part that most likely gave him the reaction   Hctz [Hydrochlorothiazide]  Nausea and headache    Sulfa Antibiotics Nausea And Vomiting   Sulfonamide Derivatives      Current Outpatient Medications  Medication Sig Dispense Refill   acetaminophen (TYLENOL) 325 MG tablet Take 2 tablets (650 mg total) by mouth every 6 (six) hours as needed for mild pain (or Fever >/= 101). 12 tablet 0   amLODipine (NORVASC) 10 MG tablet Take 1 tablet (10 mg total) by mouth daily. 30 tablet 6   aspirin EC 81 MG tablet Take 1 tablet (81 mg total) by mouth daily. Swallow whole.     atenolol (TENORMIN) 25 MG tablet Take 1 tablet (25 mg total) by mouth daily. 90 tablet 3   atorvastatin (LIPITOR) 40 MG tablet Take 1 tablet (40 mg total) by mouth daily. 90 tablet 3   blood glucose meter kit and supplies KIT Dispense based on patient and insurance preference. Use up to four times daily as directed. (FOR ICD-9 250.00, 250.01). 1 each 0   cholecalciferol (VITAMIN D) 1000 UNITS tablet Take  2,000 Units by mouth 2 (two) times daily.     Continuous Glucose Receiver (FREESTYLE LIBRE 3 READER) DEVI 1 each by Does not apply route 4 (four) times daily. 1 each 1   Continuous Glucose Sensor (FREESTYLE LIBRE 3 SENSOR) MISC 1 each by Does not apply route every 14 (fourteen) days. Place 1 sensor on the skin every 14 days. Use to check glucose continuously 6 each 3   diclofenac Sodium (VOLTAREN) 1 % GEL SMARTSIG:2 Gram(s) Topical 3 Times Daily PRN     glipiZIDE (GLUCOTROL) 5 MG tablet Take 2 tablets (10 mg total) by mouth daily before breakfast AND 1 tablet (5 mg total) daily before supper. 270 tablet 3   insulin degludec (TRESIBA FLEXTOUCH) 100 UNIT/ML FlexTouch Pen Inject 10 Units into the skin daily. (Patient taking differently: Inject 14 Units into the skin daily.) 15 mL 2   Insulin Pen Needle (GLOBAL EASE INJECT PEN NEEDLES) 31G X 5 MM MISC Use daily as directed Dx E11.69 100 each 3   Lancets (ONETOUCH DELICA PLUS LANCET33G) MISC 4 (four) times daily.     metFORMIN (GLUCOPHAGE) 1000 MG tablet Take 1 tablet (1,000 mg total) by mouth 2 (two) times daily with a meal. 180 tablet 3   ONETOUCH ULTRA test strip Test BS 4 times daily Dx E11.69 400 each 3   Probiotic Product (ALIGN PO) Take by mouth daily.     Semaglutide (RYBELSUS) 14 MG TABS Take 1 tablet (14 mg total) by mouth daily. 90 tablet 1   temazepam (RESTORIL) 15 MG capsule TAKE ONE CAPSULE BY MOUTH AT BEDTIME AS NEEDED (Patient taking differently: Take 15 mg by mouth at bedtime as needed for sleep.) 30 capsule 1   No current facility-administered medications for this visit.     Past Surgical History:  Procedure Laterality Date   bladder cancer - operation - 01/21/18 - Dr Berneice Heinrich  01/21/2018   CARPAL TUNNEL RELEASE     bilateral    CHOLECYSTECTOMY  11/2016   COLONOSCOPY N/A 09/22/2018   Procedure: COLONOSCOPY;  Surgeon: Malissa Hippo, MD;  Location: [facility];  Service: Endoscopy;  Laterality: N/A;  1:00   CORONARY ANGIOPLASTY  WITH STENT PLACEMENT     to rt coronary atery (Dr. Degert-cardiologist)   CYSTOSCOPY WITH RETROGRADE PYELOGRAM, URETEROSCOPY AND STENT PLACEMENT Left 03/07/2018   Procedure: CYSTOSCOPY WITH RETROGRADE PYELOGRAM,  AND STENT PLACEMENT;  Surgeon: Sebastian Ache, MD;  Location: [facility];  Service: Urology;  Laterality: Left;   ERCP N/A 11/24/2016   Procedure: ENDOSCOPIC RETROGRADE CHOLANGIOPANCREATOGRAPHY (ERCP);  Surgeon: Malissa Hippo, MD;  Location: [facility];  Service: Endoscopy;  Laterality: N/A;   EXTRACORPOREAL SHOCK WAVE LITHOTRIPSY Left 03/17/2018   Procedure: LEFT EXTRACORPOREAL SHOCK WAVE LITHOTRIPSY (ESWL);  Surgeon: Bjorn Pippin, MD;  Location: [facility];  Service: Urology;  Laterality: Left;   REMOVAL OF STONES N/A 11/24/2016   Procedure: REMOVAL OF STONES;  Surgeon: Malissa Hippo, MD;  Location: [facility];  Service: Endoscopy;  Laterality: N/A;   SPHINCTEROTOMY N/A 11/24/2016   Procedure: SPHINCTEROTOMY;  Surgeon: Malissa Hippo, MD;  Location: [facility];  Service: Endoscopy;  Laterality: N/A;   TISSUE AORTIC VALVE REPLACEMENT     2012     Allergies  Allergen Reactions   Glyxambi [Empagliflozin-Linagliptin] Diarrhea and Itching    He was able to take Jardiance alone with no side effects so it was the Tradjenta part that most likely gave him the reaction   Hctz [Hydrochlorothiazide]     Nausea and headache    Sulfa Antibiotics Nausea And Vomiting   Sulfonamide Derivatives       Family History  Problem Relation Age of Onset   Hearing loss Mother    Alzheimer's disease Mother    Hearing loss Father    Diabetes Father    Diabetes Other        Family History      Social History Mr. Abellera reports that he quit smoking about 20 years ago. His smoking use included cigarettes. He started smoking about 48 years ago. He has a 42 pack-year smoking history. He has never been exposed to tobacco smoke. He has never used smokeless tobacco. Mr. Highsmith reports no history of  alcohol use.   Review of Systems CONSTITUTIONAL: No weight loss, fever, chills, weakness or fatigue.  HEENT: Eyes: No visual loss, blurred vision, double vision or yellow sclerae.No hearing loss, sneezing, congestion, runny nose or sore throat.  SKIN: No rash or itching.  CARDIOVASCULAR: per hpi RESPIRATORY: No shortness of breath, cough or sputum.  GASTROINTESTINAL: No anorexia, nausea, vomiting or diarrhea. No abdominal pain or blood.  GENITOURINARY: No burning on urination, no polyuria NEUROLOGICAL: No headache, dizziness, syncope, paralysis, ataxia, numbness or tingling in the extremities. No change in bowel or bladder control.  MUSCULOSKELETAL: No muscle, back pain, joint pain or stiffness.  LYMPHATICS: No enlarged nodes. No history of splenectomy.  PSYCHIATRIC: No history of depression or anxiety.  ENDOCRINOLOGIC: No reports of sweating, cold or heat intolerance. No polyuria or polydipsia.  [name]   Physical Examination Today's Vitals   02/12/23 0837  BP: 122/72  Pulse: 70  SpO2: 98%  Weight: 184 lb 12.8 oz (83.8 kg)  Height: 5\' 10"  (1.778 m)   Body mass index is 26.52 kg/m.  Gen: resting comfortably, no acute distress HEENT: no scleral icterus, pupils equal round and reactive, no palptable cervical adenopathy,  CV: RRR, no m/rg, no jvd Resp: Clear to auscultation bilaterally GI: abdomen is soft, non-tender, non-distended, normal bowel sounds, no hepatosplenomegaly MSK: extremities are warm, no edema.  Skin: warm, no rash Neuro:  no focal deficits Psych: appropriate affect   Diagnostic Studies  09/2012 MRI/MRA Chest Stable 4 cm ascending thoracic aneurysm   08/2010 Cath HEMODYNAMIC DATA: Right atrial pressures 7/5 with a mean of 4 mmHg,   right ventricular pressure is 24 with an EDP of 7 mmHg. Pulmonary   artery pressure is 20/7 with a mean of  13 mmHg. Pulmonary capillary   wedge pressure is 10/7 with a mean of 6 mmHg. Aortic pressure is 118/67   with a mean of 88  mmHg. Left ventricular pressure is 161 with an EDP of   12 mmHg. There is no significant mitral valve gradient. The aortic   valve mean gradient is 31 mmHg with calculated valve area of 0.8   centimeter squared. Thermodilution cardiac output is 4.4 L per minute   with an index of 2, Fick cardiac outputs 4.7 L per minute with an index   of 2.2.   ANGIOGRAPHIC DATA: The left ventricular angiography was performed in a   RAO view. This demonstrates normal left ventricular size and   contractility with normal systolic function. Ejection fraction is   estimated at 65%. The aortic valve is calcified with eccentric opening   and restrictive mobility.   The left coronary artery arises and distributes normally. The left main   coronary has 20% narrowing in the ostium and in the distal left main.   The left anterior descending artery is a tortuous vessel which has less   than 10% irregularities.   Left circumflex coronary artery is rise to first obtuse marginal vessel   which has a 40-50% stenosis proximally.   The right coronary artery is a dominant vessel, it has 20% disease in   the distal vessel. The stent in the midvessel was widely patent.   FINAL INTERPRETATION:   1. Nonobstructive atherosclerotic coronary artery disease.   2. Normal left ventricular function.   3. Severe aortic stenosis.   4. Normal right heart pressures.   PLAN: Proceed with aortic valve replacement.   10/2013 MRA Chest EXAM: MRA CHEST WITH OR WITHOUT CONTRAST   TECHNIQUE: Angiographic images of the chest were obtained using MRA technique without and with intravenous contrast.   CONTRAST:  20mL MULTIHANCE GADOBENATE DIMEGLUMINE 529 MG/ML IV SOLN   COMPARISON:  CT UROGRAM dated 05/08/2011; MR MRA CHEST W/ OR W/O CM dated 10/07/2012; CT ANGIO CHEST dated 09/08/2010   FINDINGS: There is stable aneurysmal dilatation of the aortic root that does not involve the sinuses of Valsalva. No aortic dissection is present.  Artifact is noted from a prosthetic aortic valve. Diameter at the level of the sinuses of Valsalva is approximately 3.2 cm. Maximal caliber of the ascending thoracic aorta is 4.1- 4.2 cm. The proximal arch measures 3.8 cm. The distal arch measures 2.6 cm. The descending thoracic aorta measures 2.6 cm. Proximal great vessels show stable and normal patency without anatomical variant.   The heart size is normal. No pleural or pericardial fluid is identified. No masses or lymphadenopathy are seen. Visualized spine shows no abnormalities.   IMPRESSION: Stable aneurysmal dilatation of the ascending thoracic aorta measuring 4.1- 4.2 cm in greatest diameter. No dissection is identified.   10/2014 CTA Chest IMPRESSION: 1. Mild fusiform aneurysmal dilatation of the ascending thoracic aorta measuring approximately 41 mm in maximal diameter, stable since the 2012 examination. 2. Stable sequela of prior median sternotomy and aortic valve replacement. 3. Coronary artery calcifications. 4. Punctate (approximately 5 mm) in determine right lower lobe pulmonary nodule, not definitely seen on the 20/2012 examination. If the patient is at high risk for bronchogenic carcinoma, follow-up chest CT at 6-12 months is recommended. If the patient is at low risk for bronchogenic carcinoma, follow-up chest CT at 12 months is recommended. This recommendation follows the consensus statement:   11/2015 CTA chest   IMPRESSION: Mild aneurysmal dilatation of  the ascending aorta with a maximal diameter of 4.0 cm. This is not significantly changed.     11/2016 echo Study Conclusions   - Left ventricle: The cavity size was normal. Wall thickness was   normal. Systolic function was normal. The estimated ejection   fraction was in the range of 60% to 65%. Wall motion was normal;   there were no regional wall motion abnormalities. Left   ventricular diastolic function parameters were normal. - Aortic valve: There  isa 25-mm Edwards pericardial Magna-Ease   valve model #[id number] in the AV position. Mildly calcified   annulus. Trileaflet; normal thickness leaflets. There is a mild   gradient across the prosthetic valve. Mean gradient (S): 12 mm   Hg. Mean gradient of 18 mmHg with pedhoff probe. Valve area   (VTI): 1.71 cm^2. - Technically adequate study.   11/2016 CTA chest IMPRESSION: 1. Stable mild fusiform aneurysm of the ascending aorta, measuring maximum diameter of 4.0 cm. Recommend annual imaging followup by CTA or MRA. This recommendation follows 2010 ACCF/AHA/AATS/ACR/ASA/SCA/SCAI/SIR/STS/SVM Guidelines for the Diagnosis and Management of Patients with Thoracic Aortic Disease. Circulation. 2010; 121: G956-O130 2. Long-term stability of pulmonary nodules, consistent with benign process.     01/2019 Chest CTA IMPRESSION: 1. Stable appearance of ascending thoracic aorta. Maximal measurement is 3.7 cm. This was overestimated on axial imaging previously. Recommend annual imaging followup by CTA or MRA. This recommendation follows 2010 ACCF/AHA/AATS/ACR/ASA/SCA/SCAI/SIR/STS/SVM Guidelines for the Diagnosis and Management of Patients with Thoracic Aortic Disease. Circulation.2010; 121: Q657-Q469. Aortic aneurysm NOS (ICD10-I71.9) 2. Stable peripheral pleural based subcentimeter pulmonary nodules, likely inflammatory. No follow-up necessary. 3.  Aortic Atherosclerosis (ICD10-I70.0).   01/2020 CTA IMPRESSION: 1. Ascending aorta is at the upper limits of normal in size, stable. 2. Hepatic steatosis. Mild marginal irregularity the liver is indicative of cirrhosis. 3. Aortic atherosclerosis (ICD10-I70.0). Coronary artery calcification. 4.  Emphysema (ICD10-J43.9).       Jan 2023 echo   IMPRESSIONS     1. Left ventricular ejection fraction, by estimation, is 60 to 65%. The  left ventricle has normal function. The left ventricle has no regional  wall motion abnormalities. There is mild  left ventricular hypertrophy.  Left ventricular diastolic parameters  were normal.   2. Right ventricular systolic function is normal. The right ventricular  size is normal. Tricuspid regurgitation signal is inadequate for assessing  PA pressure.   3. The mitral valve is degenerative. Trivial mitral valve regurgitation.  No evidence of mitral stenosis. The mean mitral valve gradient is 2.0  mmHg.   4. The aortic valve has been repaired/replaced. Aortic valve  regurgitation is not visualized. There is a 25 mm [facility] Ease  pericardial valve present in the aortic position. Aortic valve mean  gradient measures 6.5 mmHg. Grossly normal prosthetic  function.   5. The inferior vena cava is normal in size with greater than 50%  respiratory variability, suggesting right atrial pressure of 3 mmHg.     02/2022 monitor 30 day monitor   Min HR 51, Max HR 107, Avg HR 70   No symptoms reported   Telemetry tracings show normal sinus rhythm, no significant arrhythmias     05/2022 echo 1. Left ventricular ejection fraction, by estimation, is 55 to 60%. The  left ventricle has normal function. The left ventricle has no regional  wall motion abnormalities. There is mild left ventricular hypertrophy.  Left ventricular diastolic parameters  are indeterminate. Elevated left atrial pressure.   2. Right ventricular systolic function  is normal. The right ventricular  size is normal. There is normal pulmonary artery systolic pressure.   3. The mitral valve is normal in structure. No evidence of mitral valve  regurgitation. No evidence of mitral stenosis.   4. 25-mm Edwards pericardial valve is in the AV position. . The aortic  valve has been repaired/replaced. Aortic valve regurgitation is not  visualized. No aortic stenosis is present.   5. Aortic dilatation noted. There is mild dilatation of the aortic root,  measuring 40 mm.   6. The inferior vena cava is normal in size with greater than 50%   respiratory variability, suggesting right atrial pressure of 3 mmHg.    05/2022 carotid US Right Carotid: Velocities in the right ICA are consistent with a 60-79%                 stenosis, calcific plaque may obscure higher velocity.   Left Carotid: Velocities in the left ICA are consistent with a 40-59%  stenosis.   Vertebrals: Bilateral vertebral arteries demonstrate antegrade flow.  Subclavians: Normal flow hemodynamics were seen in bilateral subclavian               arteries.    Assessment and Plan  1. Aortic valve replacement - hx of bicuspid AV, s/p bioprosthetic AVR in 2012 - 05/2022 echo normal AVR - denies any symptoms, continue to monitor.    2.. HTN -at goal,. Continue current meds     4. Afib, isolated in setting of severe illness - recent diagnosis Jan 2023 during admission. Had COVID infection around that time, unclear if afib solely secondary to COVID - 30 day monitor without recurrent afib, anticoag was stopped. Appears to have been isolated afib in setting of COVID/systemic illness - EKG today shows NSR, Denies any palpitations.  - no signs of recurrence at this point.    5. Hyperlipidemia -LDL at goal, TGs should come down as blood sugars improve - continue current meds  6. Carotid stenosis - overdue for vascular f/u, will get him plugged back in with vascular  F/u 6 months    Antoine Poche, M.D.

## 2023-02-12 NOTE — Patient Instructions (Signed)
Medication Instructions:  Your physician recommends that you continue on your current medications as directed. Please refer to the Current Medication list given to you today.   Labwork: None today  Testing/Procedures: None today  Follow-Up: 6 months  Any Other Special Instructions Will Be Listed Below (If Applicable).  You have been referred to Vascular Surgery, they will call you to schedule an appointment   If you need a refill on your cardiac medications before your next appointment, please call your pharmacy.

## 2023-02-15 ENCOUNTER — Ambulatory Visit (INDEPENDENT_AMBULATORY_CARE_PROVIDER_SITE_OTHER): Payer: PPO | Admitting: Gastroenterology

## 2023-02-15 ENCOUNTER — Encounter (INDEPENDENT_AMBULATORY_CARE_PROVIDER_SITE_OTHER): Payer: Self-pay | Admitting: Gastroenterology

## 2023-02-15 VITALS — BP 117/61 | HR 67 | Temp 97.1°F | Ht 70.0 in | Wt 185.1 lb

## 2023-02-15 DIAGNOSIS — K5904 Chronic idiopathic constipation: Secondary | ICD-10-CM

## 2023-02-15 DIAGNOSIS — K5900 Constipation, unspecified: Secondary | ICD-10-CM | POA: Insufficient documentation

## 2023-02-15 DIAGNOSIS — K638219 Small intestinal bacterial overgrowth, unspecified: Secondary | ICD-10-CM

## 2023-02-15 NOTE — Patient Instructions (Addendum)
Try to increase Miralax 1 capful every day, can take it every other day if having too many bowel movements.

## 2023-02-15 NOTE — Progress Notes (Signed)
[name] [name], M.D. Gastroenterology & Hepatology [facility] Gastroenter[facility] [address] [place], [place] [address]  Primary Care Physician: Dettinger, [name], MD [address] [place] [address]  I will communicate my assessment and recommendations to the referring MD via EMR.  Problems: SIBO CIC Possible advanced liver fibrosis  History of Present Illness: [name] [name] is a 67 y.o. male with past medical history of aortic stenosis, arthritis, afib, cad, bile duct calculus with pancreatitis, DM, NAFLD, HLD, HTN, kidney stones, OSA on cpap, vitamin d deficiency who presents for follow up of SIBO and constipation.  The patient was last seen on 10/29/2022. At that time, the patient was ordered to have SIBO breath test.  Testing was consistent with SIBO.  He was initially prescribed Xifaxan but due to cost the patient asked for a different formulation.  Prescription for ciprofloxacin and metronidazole was sent to the pharmacy.  However, after he took cipro and Flagyl course, he felt sicker as he was having bloating and abdominal pain, with 4 episodes of large amount of diarrhea and abdominal tenderness.  He called back to our office to give updates about his symptoms and he was given again the prescription for Xifaxan for 2 weeks.  Patient finished his Xifaxan on Friday. He has only noticed some constipation for the last couple of weeks, for which he has been taking Miralax 1 capful every other day, which makes him have a bowel movement without straining. Never tried taking it one a regular basis.  States that overall he feels much better compared to prior. The patient denies having any nausea, vomiting, fever, chills, hematochezia, melena, hematemesis, abdominal distention, abdominal pain, diarrhea, jaundice, pruritus or weight loss.  Last Colonoscopy:2020 The perianal and digital rectal examinations were normal. Scattered medium- mouthed diverticula were  found in the sigmoid colon and hepatic flexure. External and internal hemorrhoids were found during retroflexion. The hemorrhoids were small.  Recommended repeat in 10 years.  Past Medical History: Past Medical History:  Diagnosis Date   Aortic stenosis 8/09   Bicuspid aortic valve   Arthritis 6/10   hand    Atrial fibrillation (HCC)    post op only   Bile duct calculus with acute cholecystitis    CAD (coronary artery disease) 8/09   Calculus of common bile duct with acute pancreatitis 11/2016   CTS (carpal tunnel syndrome)    bilateral    Diabetes (HCC)    Diastolic dysfunction 8/09   Elevated LFTs    Fatty liver disease, nonalcoholic    History of kidney stones    Hyperlipidemia    Hypertension    Kidney stones    Nasal congestion    NIDDM (non-insulin dependent diabetes mellitus)    x15 yrs   OSA on CPAP 2006   Paresthesias in left hand 02/19/2004   Paresthesias in right hand 02/19/2004   Sinusitis    Vitamin D deficiency 06/13/09    Past Surgical History: Past Surgical History:  Procedure Laterality Date   bladder cancer - operation - 01/21/18 - Dr Berneice Heinrich  01/21/2018   CARPAL TUNNEL RELEASE     bilateral    CHOLECYSTECTOMY  11/2016   COLONOSCOPY N/A 09/22/2018   Procedure: COLONOSCOPY;  Surgeon: Malissa Hippo, MD;  Location: [facility];  Service: Endoscopy;  Laterality: N/A;  1:00   CORONARY ANGIOPLASTY WITH STENT PLACEMENT     to rt coronary atery (Dr. Degert-cardiologist)   CYSTOSCOPY WITH RETROGRADE PYELOGRAM, URETEROSCOPY AND  STENT PLACEMENT Left 03/07/2018   Procedure: CYSTOSCOPY WITH RETROGRADE PYELOGRAM,  AND STENT PLACEMENT;  Surgeon: Sebastian Ache, MD;  Location: [facility];  Service: Urology;  Laterality: Left;   ERCP N/A 11/24/2016   Procedure: ENDOSCOPIC RETROGRADE CHOLANGIOPANCREATOGRAPHY (ERCP);  Surgeon: Malissa Hippo, MD;  Location: [facility];  Service: Endoscopy;  Laterality: N/A;   EXTRACORPOREAL SHOCK WAVE LITHOTRIPSY Left 03/17/2018    Procedure: LEFT EXTRACORPOREAL SHOCK WAVE LITHOTRIPSY (ESWL);  Surgeon: Bjorn Pippin, MD;  Location: [facility];  Service: Urology;  Laterality: Left;   REMOVAL OF STONES N/A 11/24/2016   Procedure: REMOVAL OF STONES;  Surgeon: Malissa Hippo, MD;  [facility]: [facility];  Service: Endoscopy;  Laterality: N/A;   SPHINCTEROTOMY N/A 11/24/2016   Procedure: SPHINCTEROTOMY;  Surgeon: Malissa Hippo, MD;  [facility];  Service: Endoscopy;  Laterality: N/A;   TISSUE AORTIC VALVE REPLACEMENT     2012    Family History: Family History  Problem Relation Age of Onset   Hearing loss Mother    Alzheimer's disease Mother    Hearing loss Father    Diabetes Father    Diabetes Other        Family History     Social History: Social History   Tobacco Use  Smoking Status Former   Current packs/day: 0.00   Average packs/day: 1.5 packs/day for 28.0 years (42.0 ttl pk-yrs)   Types: Cigarettes   Start date: 07/06/1974   Quit date: 07/06/2002   Years since quitting: 20.6   Passive exposure: Never  Smokeless Tobacco Never   Social History   Substance and Sexual Activity  Alcohol Use No   Alcohol/week: 0.0 standard drinks of alcohol   Social History   Substance and Sexual Activity  Drug Use No    Allergies: Allergies  Allergen Reactions   Glyxambi [Empagliflozin-Linagliptin] Diarrhea and Itching    He was able to take Jardiance alone with no side effects so it was the Tradjenta part that most likely gave him the reaction   Hctz [Hydrochlorothiazide]     Nausea and headache    Sulfa Antibiotics Nausea And Vomiting   Sulfonamide Derivatives     Medications: Current Outpatient Medications  Medication Sig Dispense Refill   acetaminophen (TYLENOL) 325 MG tablet Take 2 tablets (650 mg total) by mouth every 6 (six) hours as needed for mild pain (or Fever >/= 101). 12 tablet 0   amLODipine (NORVASC) 10 MG tablet Take 1 tablet (10 mg total) by mouth daily. 30 tablet 6   aspirin EC 81  MG tablet Take 1 tablet (81 mg total) by mouth daily. Swallow whole.     atenolol (TENORMIN) 25 MG tablet Take 1 tablet (25 mg total) by mouth daily. 90 tablet 3   atorvastatin (LIPITOR) 40 MG tablet Take 1 tablet (40 mg total) by mouth daily. 90 tablet 3   blood glucose meter kit and supplies KIT Dispense based on patient and insurance preference. Use up to four times daily as directed. (FOR ICD-9 250.00, 250.01). 1 each 0   cholecalciferol (VITAMIN D) 1000 UNITS tablet Take 2,000 Units by mouth 2 (two) times daily.     Continuous Glucose Receiver (FREESTYLE LIBRE 3 READER) DEVI 1 each by Does not apply route 4 (four) times daily. 1 each 1   Continuous Glucose Sensor (FREESTYLE LIBRE 3 SENSOR) MISC 1 each by Does not apply route every 14 (fourteen) days. Place 1 sensor on the skin every 14 days. Use to check  glucose continuously 6 each 3   diclofenac Sodium (VOLTAREN) 1 % GEL SMARTSIG:2 Gram(s) Topical 3 Times Daily PRN     glipiZIDE (GLUCOTROL) 5 MG tablet Take 2 tablets (10 mg total) by mouth daily before breakfast AND 1 tablet (5 mg total) daily before supper. 270 tablet 3   insulin degludec (TRESIBA FLEXTOUCH) 100 UNIT/ML FlexTouch Pen Inject 10 Units into the skin daily. (Patient taking differently: Inject 14 Units into the skin daily.) 15 mL 2   Insulin Pen Needle (GLOBAL EASE INJECT PEN NEEDLES) 31G X 5 MM MISC Use daily as directed Dx E11.69 100 each 3   Lancets (ONETOUCH DELICA PLUS LANCET33G) MISC 4 (four) times daily.     metFORMIN (GLUCOPHAGE) 1000 MG tablet Take 1 tablet (1,000 mg total) by mouth 2 (two) times daily with a meal. 180 tablet 3   ONETOUCH ULTRA test strip Test BS 4 times daily Dx E11.69 400 each 3   Probiotic Product (ALIGN PO) Take by mouth daily.     temazepam (RESTORIL) 15 MG capsule TAKE ONE CAPSULE BY MOUTH AT BEDTIME AS NEEDED (Patient taking differently: Take 15 mg by mouth at bedtime as needed for sleep.) 30 capsule 1   gabapentin (NEURONTIN) 100 MG capsule Take  100 mg by mouth at bedtime. (Patient not taking: Reported on 02/15/2023)     No current facility-administered medications for this visit.    Review of Systems: GENERAL: negative for malaise, night sweats HEENT: No changes in hearing or vision, no nose bleeds or other nasal problems. NECK: Negative for lumps, goiter, pain and significant neck swelling RESPIRATORY: Negative for cough, wheezing CARDIOVASCULAR: Negative for chest pain, leg swelling, palpitations, orthopnea GI: SEE HPI MUSCULOSKELETAL: Negative for joint pain or swelling, back pain, and muscle pain. SKIN: Negative for lesions, rash PSYCH: Negative for sleep disturbance, mood disorder and recent psychosocial stressors. HEMATOLOGY Negative for prolonged bleeding, bruising easily, and swollen nodes. ENDOCRINE: Negative for cold or heat intolerance, polyuria, polydipsia and goiter. NEURO: negative for tremor, gait imbalance, syncope and seizures. The remainder of the review of systems is noncontributory.   Physical Exam: BP 117/61 (BP Location: Left Arm, Patient Position: Sitting, Cuff Size: Normal)   Pulse 67   Temp (!) 97.1 F (36.2 C) (Temporal)   Ht 5\' 10"  (1.778 m)   Wt 185 lb 1.6 oz (84 kg)   BMI 26.56 kg/m  GENERAL: The patient is AO x3, in no acute distress. HEENT: Head is normocephalic and atraumatic. EOMI are intact. Mouth is well hydrated and without lesions. NECK: Supple. No masses LUNGS: Clear to auscultation. No presence of rhonchi/wheezing/rales. Adequate chest expansion HEART: RRR, normal s1 and s2. ABDOMEN: Soft, nontender, no guarding, no peritoneal signs, and nondistended. BS +. No masses. EXTREMITIES: Without any cyanosis, clubbing, rash, lesions or edema. NEUROLOGIC: AOx3, no focal motor deficit. SKIN: no jaundice, no rashes  Imaging/Labs: as above  I personally reviewed and interpreted the available labs, imaging and endoscopic files.  Impression and Plan: AWS MALLARE is a 67 y.o. male with  past medical history of aortic stenosis, arthritis, afib, cad, bile duct calculus with pancreatitis, DM, NAFLD, HLD, HTN, kidney stones, OSA on cpap, vitamin d deficiency who presents for follow up of SIBO and constipation.  Patient received treatment for SIBO with Xifaxan recently and his symptoms have improved since then.  However, he has presented some recurrent constipation which he has managed with the use of MiraLAX.  We discussed about the possibility of increasing the frequency to  the daily dosing which he is agreeable to try.  We also discussed that he is prone to have more bouts of SIBO, he will need to keep [place] updated as he may need repeat courses of Xifaxan.  Finally, he should proceed with liver elastography in September as part of surveillance of his possible hepatic fibrosis.  - Try to increase Miralax 1 capful every day, can take it every other day if having too many bowel movements. -Patient to call back our office if presenting worsening recurrent diarrhea and bloating, may need new course of Xifaxan -Proceed with the scheduled liver elastography in September  All questions were answered.      [name] Blazing, MD Gastroenterology and Hepatology [facility] Gastroenterology

## 2023-02-18 ENCOUNTER — Encounter: Payer: Self-pay | Admitting: Family Medicine

## 2023-02-19 MED ORDER — ONETOUCH ULTRA VI STRP: ORAL_STRIP | 3 refills | Status: DC

## 2023-02-26 ENCOUNTER — Encounter: Payer: Self-pay | Admitting: Family Medicine

## 2023-02-26 ENCOUNTER — Ambulatory Visit (INDEPENDENT_AMBULATORY_CARE_PROVIDER_SITE_OTHER): Payer: PPO | Admitting: Family Medicine

## 2023-02-26 VITALS — BP 130/70 | HR 59 | Ht 70.0 in | Wt 182.0 lb

## 2023-02-26 DIAGNOSIS — G4709 Other insomnia: Secondary | ICD-10-CM

## 2023-02-26 DIAGNOSIS — E1159 Type 2 diabetes mellitus with other circulatory complications: Secondary | ICD-10-CM

## 2023-02-26 DIAGNOSIS — E785 Hyperlipidemia, unspecified: Secondary | ICD-10-CM

## 2023-02-26 DIAGNOSIS — E1169 Type 2 diabetes mellitus with other specified complication: Secondary | ICD-10-CM | POA: Diagnosis not present

## 2023-02-26 DIAGNOSIS — I152 Hypertension secondary to endocrine disorders: Secondary | ICD-10-CM | POA: Diagnosis not present

## 2023-02-26 DIAGNOSIS — Z7984 Long term (current) use of oral hypoglycemic drugs: Secondary | ICD-10-CM

## 2023-02-26 DIAGNOSIS — Z79899 Other long term (current) drug therapy: Secondary | ICD-10-CM | POA: Diagnosis not present

## 2023-02-26 DIAGNOSIS — I10 Essential (primary) hypertension: Secondary | ICD-10-CM

## 2023-02-26 LAB — BAYER DCA HB A1C WAIVED: HB A1C (BAYER DCA - WAIVED): 7.4 % — ABNORMAL HIGH (ref 4.8–5.6)

## 2023-02-26 NOTE — Addendum Note (Signed)
Addended by: Dorene Sorrow on: 02/26/2023 10:46 AM   Modules accepted: Orders

## 2023-02-26 NOTE — Progress Notes (Signed)
BP 130/70   Pulse (!) 59   Ht 5\' 10"  (1.778 m)   Wt 182 lb (82.6 kg)   SpO2 98%   BMI 26.11 kg/m    Subjective:   Patient ID: [name] [name], male    DOB: 02/04/1956, 67 y.o.   MRN: [id number]  HPI: [name] [name] is a 67 y.o. male presenting on 02/26/2023 for Medical Management of Chronic Issues, Hyperlipidemia, Hypertension, and Diabetes   HPI Type 2 diabetes mellitus Patient comes in today for recheck of his diabetes. Patient has been currently taking metformin and glipizide and Tresiba, 14 units. Patient is not currently on an ACE inhibitor/ARB. Patient has not seen an ophthalmologist this year. Patient denies any new issues with their feet. The symptom started onset as an adult hypertension and hyperlipidemia and A-fib and CAD ARE RELATED TO DM   Hypertension Patient is currently on amlodipine and atenolol, and their blood pressure today is 130/70. Patient denies any lightheadedness or dizziness. Patient denies headaches, blurred vision, chest pains, shortness of breath, or weakness. Denies any side effects from medication and is content with current medication.   Hyperlipidemia Patient is coming in for recheck of his hyperlipidemia. The patient is currently taking atorvastatin. They deny any issues with myalgias or history of liver damage from it. They deny any focal numbness or weakness or chest pain.   Insomnia recheck Current rx-temazepam 15 mg nightly. # meds rx-30 Effectiveness of current meds-works well, rarely uses Adverse reactions form meds-none Pill count performed-No Last drug screen -02/11/2021 ( high risk q80m, moderate risk q61m, low risk yearly ) Urine drug screen today- Yes Was the N[facility] reviewed-yes  If yes were their any concerning findings? -None, last refill was a year ago Controlled substance contract signed on: Today  Relevant past medical, surgical, family and social history reviewed and updated as indicated. Interim medical history since our last visit  reviewed. Allergies and medications reviewed and updated.  Review of Systems  Constitutional:  Negative for chills and fever.  Eyes:  Negative for visual disturbance.  Respiratory:  Negative for shortness of breath and wheezing.   Cardiovascular:  Negative for chest pain and leg swelling.  Musculoskeletal:  Negative for back pain and gait problem.  Skin:  Negative for rash.  All other systems reviewed and are negative.   Per HPI unless specifically indicated above   Allergies as of 02/26/2023       Reactions   Glyxambi [empagliflozin-linagliptin] Diarrhea, Itching   He was able to take Jardiance alone with no side effects so it was the Tradjenta part that most likely gave him the reaction   Hctz [hydrochlorothiazide]    Nausea and headache    Sulfa Antibiotics Nausea And Vomiting   Sulfonamide Derivatives         Medication List        Accurate as of February 26, 2023 10:12 AM. If you have any questions, ask your nurse or doctor.          acetaminophen 325 MG tablet Commonly known as: TYLENOL Take 2 tablets (650 mg total) by mouth every 6 (six) hours as needed for mild pain (or Fever >/= 101).   ALIGN PO Take by mouth daily.   amLODipine 10 MG tablet Commonly known as: NORVASC Take 1 tablet (10 mg total) by mouth daily.   aspirin EC 81 MG tablet Take 1 tablet (81 mg total) by mouth daily. Swallow whole.   atenolol 25 MG tablet Commonly known  as: TENORMIN Take 1 tablet (25 mg total) by mouth daily.   atorvastatin 40 MG tablet Commonly known as: LIPITOR Take 1 tablet (40 mg total) by mouth daily.   blood glucose meter kit and supplies Kit Dispense based on patient and insurance preference. Use up to four times daily as directed. (FOR ICD-9 250.00, 250.01).   cholecalciferol 1000 units tablet Commonly known as: VITAMIN D Take 2,000 Units by mouth 2 (two) times daily.   diclofenac Sodium 1 % Gel Commonly known as: VOLTAREN SMARTSIG:2 Gram(s) Topical 3  Times Daily PRN   FreeStyle Libre 3 Reader Devi 1 each by Does not apply route 4 (four) times daily.   FreeStyle Libre 3 Sensor Misc 1 each by Does not apply route every 14 (fourteen) days. Place 1 sensor on the skin every 14 days. Use to check glucose continuously   gabapentin 100 MG capsule Commonly known as: NEURONTIN Take 100 mg by mouth at bedtime.   glipiZIDE 5 MG tablet Commonly known as: GLUCOTROL Take 2 tablets (10 mg total) by mouth daily before breakfast AND 1 tablet (5 mg total) daily before supper.   Global Ease Inject Pen Needles 31G X 5 MM Misc Generic drug: Insulin Pen Needle Use daily as directed Dx E11.69   metFORMIN 1000 MG tablet Commonly known as: GLUCOPHAGE Take 1 tablet (1,000 mg total) by mouth 2 (two) times daily with a meal.   OneTouch Delica Plus Lancet33G Misc 4 (four) times daily.   OneTouch Ultra test strip Generic drug: glucose blood Test BS 4 times daily Dx E11.69   temazepam 15 MG capsule Commonly known as: RESTORIL TAKE ONE CAPSULE BY MOUTH AT BEDTIME AS NEEDED What changed:  how much to take how to take this when to take this reasons to take this additional instructions   Tresiba FlexTouch 100 UNIT/ML FlexTouch Pen Generic drug: insulin degludec Inject 10 Units into the skin daily. What changed: how much to take         Objective:   BP 130/70   Pulse (!) 59   Ht 5\' 10"  (1.778 m)   Wt 182 lb (82.6 kg)   SpO2 98%   BMI 26.11 kg/m   Wt Readings from Last 3 Encounters:  02/26/23 182 lb (82.6 kg)  02/15/23 185 lb 1.6 oz (84 kg)  02/12/23 184 lb 12.8 oz (83.8 kg)    Physical Exam Vitals and nursing note reviewed.  Constitutional:      General: He is not in acute distress.    Appearance: He is well-developed. He is not diaphoretic.  Eyes:     General: No scleral icterus.    Conjunctiva/sclera: Conjunctivae normal.  Neck:     Thyroid: No thyromegaly.  Cardiovascular:     Rate and Rhythm: Normal rate and regular  rhythm.     Heart sounds: Normal heart sounds. No murmur heard. Pulmonary:     Effort: Pulmonary effort is normal. No respiratory distress.     Breath sounds: Normal breath sounds. No wheezing.  Musculoskeletal:        General: No swelling. Normal range of motion.     Cervical back: Neck supple.  Lymphadenopathy:     Cervical: No cervical adenopathy.  Skin:    General: Skin is warm and dry.     Findings: No rash.  Neurological:     Mental Status: He is alert and oriented to person, place, and time.     Coordination: Coordination normal.  Psychiatric:  Behavior: Behavior normal.       Assessment & Plan:   Problem List Items Addressed This Visit       Cardiovascular and Mediastinum   Essential hypertension     Endocrine   Type 2 diabetes mellitus (HCC)   Type 2 diabetes mellitus with hyperlipidemia (HCC) - Primary   Relevant Orders   CBC with Differential/Platelet   CMP14+EGFR   Lipid panel   Bayer DCA Hb A1c Waived   PSA, total and free     Other   Hyperlipidemia with target LDL less than 70   INSOMNIA    A1c is 1.4 which is slightly better but he is getting hypoglycemic episodes in the evening so instructed him to change his evening glipizide to take at lunchtime instead of the evening and let me know in 2 weeks if he still getting hypoglycemic at night.  He says he gets down to 60 at night. Follow up plan: Return in about 3 months (around 05/29/2023), or if symptoms worsen or fail to improve, for Diabetes recheck.  Counseling provided for all of the vaccine components Orders Placed This Encounter  Procedures   CBC with Differential/Platelet   CMP14+EGFR   Lipid panel   Bayer DCA Hb A1c Waived   PSA, total and free    Arville Care, MD [facility] 02/26/2023, 10:12 AM

## 2023-02-26 NOTE — Addendum Note (Signed)
Addended by: Dorene Sorrow on: 02/26/2023 10:20 AM   Modules accepted: Orders

## 2023-02-27 LAB — CBC WITH DIFFERENTIAL/PLATELET
Basophils Absolute: 0.1 10*3/uL (ref 0.0–0.2)
Basos: 1 %
EOS (ABSOLUTE): 0.2 10*3/uL (ref 0.0–0.4)
Eos: 4 %
Hematocrit: 35.2 % — ABNORMAL LOW (ref 37.5–51.0)
Hemoglobin: 11.6 g/dL — ABNORMAL LOW (ref 13.0–17.7)
Immature Grans (Abs): 0 10*3/uL (ref 0.0–0.1)
Immature Granulocytes: 0 %
Lymphocytes Absolute: 0.9 10*3/uL (ref 0.7–3.1)
Lymphs: 16 %
MCH: 32.1 pg (ref 26.6–33.0)
MCHC: 33 g/dL (ref 31.5–35.7)
MCV: 98 fL — ABNORMAL HIGH (ref 79–97)
Monocytes Absolute: 0.5 10*3/uL (ref 0.1–0.9)
Monocytes: 9 %
Neutrophils Absolute: 3.9 10*3/uL (ref 1.4–7.0)
Neutrophils: 70 %
Platelets: 231 10*3/uL (ref 150–450)
RBC: 3.61 x10E6/uL — ABNORMAL LOW (ref 4.14–5.80)
RDW: 12.7 % (ref 11.6–15.4)
WBC: 5.7 10*3/uL (ref 3.4–10.8)

## 2023-02-27 LAB — MICROALBUMIN / CREATININE URINE RATIO
Creatinine, Urine: 146.5 mg/dL
Microalb/Creat Ratio: 50 mg/g{creat} — ABNORMAL HIGH (ref 0–29)
Microalbumin, Urine: 73.9 ug/mL

## 2023-02-27 LAB — CMP14+EGFR
ALT: 14 IU/L (ref 0–44)
AST: 19 IU/L (ref 0–40)
Albumin: 4.2 g/dL (ref 3.9–4.9)
Alkaline Phosphatase: 75 IU/L (ref 44–121)
BUN/Creatinine Ratio: 20 (ref 10–24)
BUN: 25 mg/dL (ref 8–27)
Bilirubin Total: 0.4 mg/dL (ref 0.0–1.2)
CO2: 22 mmol/L (ref 20–29)
Calcium: 8.7 mg/dL (ref 8.6–10.2)
Chloride: 107 mmol/L — ABNORMAL HIGH (ref 96–106)
Creatinine, Ser: 1.25 mg/dL (ref 0.76–1.27)
Globulin, Total: 2 g/dL (ref 1.5–4.5)
Glucose: 113 mg/dL — ABNORMAL HIGH (ref 70–99)
Potassium: 4.8 mmol/L (ref 3.5–5.2)
Sodium: 143 mmol/L (ref 134–144)
Total Protein: 6.2 g/dL (ref 6.0–8.5)
eGFR: 63 mL/min/{1.73_m2} (ref >59)

## 2023-02-27 LAB — PSA, TOTAL AND FREE
PSA, Free Pct: 65 %
PSA, Free: 0.26 ng/mL
Prostate Specific Ag, Serum: 0.4 ng/mL (ref 0.0–4.0)

## 2023-02-27 LAB — LIPID PANEL
Chol/HDL Ratio: 3.1 ratio (ref 0.0–5.0)
Cholesterol, Total: 103 mg/dL (ref 100–199)
HDL: 33 mg/dL — ABNORMAL LOW (ref >39)
LDL Chol Calc (NIH): 47 mg/dL (ref 0–99)
Triglycerides: 128 mg/dL (ref 0–149)
VLDL Cholesterol Cal: 23 mg/dL (ref 5–40)

## 2023-03-11 ENCOUNTER — Other Ambulatory Visit: Payer: Self-pay | Admitting: *Deleted

## 2023-03-11 DIAGNOSIS — I6523 Occlusion and stenosis of bilateral carotid arteries: Secondary | ICD-10-CM

## 2023-03-15 ENCOUNTER — Other Ambulatory Visit: Payer: Self-pay | Admitting: Cardiology

## 2023-03-18 ENCOUNTER — Ambulatory Visit (HOSPITAL_COMMUNITY)
Admission: RE | Admit: 2023-03-18 | Discharge: 2023-03-18 | Disposition: A | Discharge status: Home / Self Care | Payer: PPO | Source: Ambulatory Visit | Attending: Vascular Surgery | Admitting: Vascular Surgery

## 2023-03-18 ENCOUNTER — Ambulatory Visit (INDEPENDENT_AMBULATORY_CARE_PROVIDER_SITE_OTHER): Payer: PPO | Admitting: Physician Assistant

## 2023-03-18 VITALS — BP 136/78 | HR 53 | Temp 97.3°F | Resp 18 | Ht 70.0 in | Wt 190.0 lb

## 2023-03-18 DIAGNOSIS — I6523 Occlusion and stenosis of bilateral carotid arteries: Secondary | ICD-10-CM

## 2023-03-18 NOTE — Progress Notes (Signed)
History of Present Illness:  Patient is a 67 y.o. year old male who presents for evaluation of carotid stenosis.  He was seen in our office on 06/04/22 with reported dizziness.  Carotid CTA was performed which revealed 70% stenosis of the right ICA and less than 50% of the left ICA.  Last duplex showed duplex today reveals 60-79% right ICA stenosis and 40-59% left ICA stenosis.   Today he denies dizziness, amaurosis, weakness or aphasia.  His main issue is lumbar and hip OA that restricts walking.  He does ride an exercise bike daily up to an hour at a time.    He has hx of AVR with pericardial valve in 2012 by Dr. Laneta Simmers.   The pt is on a statin for cholesterol management.  The pt is on a daily aspirin.   Other AC:  Eliquis-discontinued earlier this week.  The pt is on BB for hypertension.   The pt does  have diabetes Tobacco hx:  former  Past Medical History:  Diagnosis Date   Aortic stenosis 8/09   Bicuspid aortic valve   Arthritis 6/10   hand    Atrial fibrillation (HCC)    post op only   Bile duct calculus with acute cholecystitis    CAD (coronary artery disease) 8/09   Calculus of common bile duct with acute pancreatitis 11/2016   CTS (carpal tunnel syndrome)    bilateral    Diabetes (HCC)    Diastolic dysfunction 8/09   Elevated LFTs    Fatty liver disease, nonalcoholic    History of kidney stones    Hyperlipidemia    Hypertension    Kidney stones    Nasal congestion    NIDDM (non-insulin dependent diabetes mellitus)    x15 yrs   OSA on CPAP 2006   Paresthesias in left hand 02/19/2004   Paresthesias in right hand 02/19/2004   Sinusitis    Vitamin D deficiency 06/13/09    Past Surgical History:  Procedure Laterality Date   bladder cancer - operation - 01/21/18 - Dr Berneice Heinrich  01/21/2018   CARPAL TUNNEL RELEASE     bilateral    CHOLECYSTECTOMY  11/2016   COLONOSCOPY N/A 09/22/2018   Procedure: COLONOSCOPY;  Surgeon: Malissa Hippo, MD;  Location: [facility];  Service: Endoscopy;  Laterality: N/A;  1:00   CORONARY ANGIOPLASTY WITH STENT PLACEMENT     to rt coronary atery (Dr. Degert-cardiologist)   CYSTOSCOPY WITH RETROGRADE PYELOGRAM, URETEROSCOPY AND STENT PLACEMENT Left 03/07/2018   Procedure: CYSTOSCOPY WITH RETROGRADE PYELOGRAM,  AND STENT PLACEMENT;  Surgeon: Sebastian Ache, MD;  Location: [facility];  Service: Urology;  Laterality: Left;   ERCP N/A 11/24/2016   Procedure: ENDOSCOPIC RETROGRADE CHOLANGIOPANCREATOGRAPHY (ERCP);  Surgeon: Malissa Hippo, MD;  Location: [facility];  Service: Endoscopy;  Laterality: N/A;   EXTRACORPOREAL SHOCK WAVE LITHOTRIPSY Left 03/17/2018   Procedure: LEFT EXTRACORPOREAL SHOCK WAVE LITHOTRIPSY (ESWL);  Surgeon: Bjorn Pippin, MD;  Location: [facility];  Service: Urology;  Laterality: Left;   REMOVAL OF STONES N/A 11/24/2016   Procedure: REMOVAL OF STONES;  Surgeon: Malissa Hippo, MD;  [facility]: [facility];  Service: Endoscopy;  Laterality: N/A;   SPHINCTEROTOMY N/A 11/24/2016   Procedure: SPHINCTEROTOMY;  Surgeon: Malissa Hippo, MD;  Location: [facility];  Service: Endoscopy;  Laterality: N/A;   TISSUE AORTIC VALVE REPLACEMENT     2012     Social History Social History  Tobacco Use   Smoking status: Former    Current packs/day: 0.00    Average packs/day: 1.5 packs/day for 28.0 years (42.0 ttl pk-yrs)    Types: Cigarettes    Start date: 07/06/1974    Quit date: 07/06/2002    Years since quitting: 20.7    Passive exposure: Never   Smokeless tobacco: Never  Vaping Use   Vaping status: Never Used  Substance Use Topics   Alcohol use: No    Alcohol/week: 0.0 standard drinks of alcohol   Drug use: No    Family History Family History  Problem Relation Age of Onset   Hearing loss Mother    Alzheimer's disease Mother    Hearing loss Father    Diabetes Father    Diabetes Other        Family History     Allergies  Allergies  Allergen Reactions   Glyxambi [Empagliflozin-Linagliptin]  Diarrhea and Itching    He was able to take Jardiance alone with no side effects so it was the Tradjenta part that most likely gave him the reaction   Hctz [Hydrochlorothiazide]     Nausea and headache    Sulfa Antibiotics Nausea And Vomiting   Sulfonamide Derivatives      Current Outpatient Medications  Medication Sig Dispense Refill   acetaminophen (TYLENOL) 325 MG tablet Take 2 tablets (650 mg total) by mouth every 6 (six) hours as needed for mild pain (or Fever >/= 101). 12 tablet 0   amLODipine (NORVASC) 10 MG tablet TAKE ONE TABLET DAILY 30 tablet 6   aspirin EC 81 MG tablet Take 1 tablet (81 mg total) by mouth daily. Swallow whole.     atenolol (TENORMIN) 25 MG tablet Take 1 tablet (25 mg total) by mouth daily. 90 tablet 3   atorvastatin (LIPITOR) 40 MG tablet Take 1 tablet (40 mg total) by mouth daily. 90 tablet 3   blood glucose meter kit and supplies KIT Dispense based on patient and insurance preference. Use up to four times daily as directed. (FOR ICD-9 250.00, 250.01). 1 each 0   cholecalciferol (VITAMIN D) 1000 UNITS tablet Take 2,000 Units by mouth 2 (two) times daily.     Continuous Glucose Receiver (FREESTYLE LIBRE 3 READER) DEVI 1 each by Does not apply route 4 (four) times daily. 1 each 1   Continuous Glucose Sensor (FREESTYLE LIBRE 3 SENSOR) MISC 1 each by Does not apply route every 14 (fourteen) days. Place 1 sensor on the skin every 14 days. Use to check glucose continuously 6 each 3   diclofenac Sodium (VOLTAREN) 1 % GEL SMARTSIG:2 Gram(s) Topical 3 Times Daily PRN     gabapentin (NEURONTIN) 100 MG capsule Take 100 mg by mouth at bedtime.     glipiZIDE (GLUCOTROL) 5 MG tablet Take 2 tablets (10 mg total) by mouth daily before breakfast AND 1 tablet (5 mg total) daily before supper. 270 tablet 3   insulin degludec (TRESIBA FLEXTOUCH) 100 UNIT/ML FlexTouch Pen Inject 10 Units into the skin daily. (Patient taking differently: Inject 14 Units into the skin daily.) 15 mL 2    Insulin Pen Needle (GLOBAL EASE INJECT PEN NEEDLES) 31G X 5 MM MISC Use daily as directed Dx E11.69 100 each 3   Lancets (ONETOUCH DELICA PLUS LANCET33G) MISC 4 (four) times daily.     metFORMIN (GLUCOPHAGE) 1000 MG tablet Take 1 tablet (1,000 mg total) by mouth 2 (two) times daily with a meal. 180 tablet 3   ONETOUCH ULTRA test  strip Test BS 4 times daily Dx E11.69 400 each 3   Probiotic Product (ALIGN PO) Take by mouth daily.     temazepam (RESTORIL) 15 MG capsule TAKE ONE CAPSULE BY MOUTH AT BEDTIME AS NEEDED (Patient taking differently: Take 15 mg by mouth at bedtime as needed for sleep.) 30 capsule 1   No current facility-administered medications for this visit.    ROS:   General:  No weight loss, Fever, chills  HEENT: No recent headaches, no nasal bleeding, no visual changes, no sore throat  Neurologic: No dizziness, blackouts, seizures. No recent symptoms of stroke or mini- stroke. No recent episodes of slurred speech, or temporary blindness.  Cardiac: No recent episodes of chest pain/pressure, no shortness of breath at rest.  No shortness of breath with exertion.  Denies history of atrial fibrillation or irregular heartbeat  Vascular: No history of rest pain in feet.  No history of claudication.  No history of non-healing ulcer, No history of DVT   Pulmonary: No home oxygen, no productive cough, no hemoptysis,  No asthma or wheezing  Musculoskeletal:  [ x] Arthritis, [ x] Low back pain,  [ x] Joint pain  Hematologic:No history of hypercoagulable state.  No history of easy bleeding.  No history of anemia  Gastrointestinal: No hematochezia or melena,  No gastroesophageal reflux, no trouble swallowing  Urinary: [ ]  chronic Kidney disease, [ ]  on HD - [ ]  MWF or [ ]  TTHS, [ ]  Burning with urination, [ ]  Frequent urination, [ ]  Difficulty urinating;   Skin: No rashes  Psychological: No history of anxiety,  No history of depression   Physical Examination  Vitals:   03/18/23  1018 03/18/23 1019  BP: 135/70 136/78  Pulse: (!) 53   Resp: 18   Temp: (!) 97.3 F (36.3 C)   TempSrc: Temporal   SpO2: 96%   Weight: 190 lb (86.2 kg)   Height: 5\' 10"  (1.778 m)     Body mass index is 27.26 kg/m.  General:  Alert and oriented, no acute distress HEENT: Normal Neck: No bruit or JVD Pulmonary: Clear to auscultation bilaterally Cardiac: Regular Rate and Rhythm without murmur Gastrointestinal: Soft, non-tender, non-distended, no mass, no scars Skin: No rash Extremity Pulses: radial,  femoral, dorsalis pedis, posterior tibial pulses bilaterally Musculoskeletal: No deformity or edema  Neurologic: Upper and lower extremity motor 5/5 and symmetric  DATA:   Right Carotid Findings:  +----------+--------+--------+--------+-------------------------+--------+           PSV cm/sEDV cm/sStenosisPlaque Description       Comments  +----------+--------+--------+--------+-------------------------+--------+  CCA Prox  150     17                                                 +----------+--------+--------+--------+-------------------------+--------+  CCA Mid   66      16                                                 +----------+--------+--------+--------+-------------------------+--------+  CCA Distal119     21              calcific                           +----------+--------+--------+--------+-------------------------+--------+  ICA Prox  327     70      60-79%  heterogenous and calcific          +----------+--------+--------+--------+-------------------------+--------+  ICA Mid   140     34                                                 +----------+--------+--------+--------+-------------------------+--------+  ICA Distal80      25                                                 +----------+--------+--------+--------+-------------------------+--------+  ECA      125     16              heterogenous                        +----------+--------+--------+--------+-------------------------+--------+   +----------+--------+-------+----------------+-------------------+           PSV cm/sEDV cmsDescribe        Arm Pressure (mmHG)  +----------+--------+-------+----------------+-------------------+  [id number]    0      Multiphasic, [id number]                  +----------+--------+-------+----------------+-------------------+   +---------+--------+--+--------+-+---------+  VertebralPSV cm/s30EDV cm/s7Antegrade  +---------+--------+--+--------+-+---------+      Left Carotid Findings:  +----------+--------+--------+--------+-------------------------+--------+           PSV cm/sEDV cm/sStenosisPlaque Description       Comments  +----------+--------+--------+--------+-------------------------+--------+  CCA Prox  104     19                                                 +----------+--------+--------+--------+-------------------------+--------+  CCA Mid   85      18                                                 +----------+--------+--------+--------+-------------------------+--------+  CCA Distal89      18              calcific                           +----------+--------+--------+--------+-------------------------+--------+  ICA Prox  165     46      40-59%  heterogenous and calcific          +----------+--------+--------+--------+-------------------------+--------+  ICA Mid   174     45                                                 +----------+--------+--------+--------+-------------------------+--------+  ICA Distal112     28                                                 +----------+--------+--------+--------+-------------------------+--------+  ECA      111     16              calcific                           +----------+--------+--------+--------+-------------------------+--------+    +----------+--------+--------+----------------+-------------------+           PSV cm/sEDV cm/sDescribe        Arm Pressure (mmHG)  +----------+--------+--------+----------------+-------------------+  Subclavian140    0       Multiphasic, [id number]                  +----------+--------+--------+----------------+-------------------+   +---------+--------+--+--------+--+---------+  VertebralPSV cm/s63EDV cm/s18Antegrade  +---------+--------+--+--------+--+---------+       Summary:  Right Carotid: Velocities in the right ICA are consistent with a 60-79%                 stenosis.   Left Carotid: Velocities in the left ICA are consistent with a 40-59%  stenosis.   Vertebrals: Bilateral vertebral arteries demonstrate antegrade flow.  Subclavians: Normal flow hemodynamics were seen in bilateral subclavian               arteries.    ASSESSMENT/PLAN: Asymptomatic Carotid stenosis His duplex today reveals right ICA 60-79% with EDV of 70 that is decreased from previous duplex of 85 cm/s.  The left is 40-59% stenosis with EDV of 46.  He remains asymptomatic for stroke/TIA symptoms.  I will schedule him a return visit with repeat duplex in 6 months.  If he has symptoms he will call 911.         Mosetta Pigeon PA-C [facility] Office: [phone]  MD in clinic [place]

## 2023-03-24 ENCOUNTER — Other Ambulatory Visit: Payer: Self-pay

## 2023-03-24 DIAGNOSIS — I6523 Occlusion and stenosis of bilateral carotid arteries: Secondary | ICD-10-CM

## 2023-05-16 ENCOUNTER — Encounter: Payer: Self-pay | Admitting: Family Medicine

## 2023-05-17 ENCOUNTER — Other Ambulatory Visit: Payer: Self-pay

## 2023-05-17 MED ORDER — NOVOFINE PEN NEEDLE 32G X 6 MM MISC: 1.0000 | Freq: Three times a day (TID) | 3 refills | Status: DC

## 2023-05-17 MED ORDER — FREESTYLE LIBRE 3 PLUS SENSOR MISC: 3 refills | Status: DC

## 2023-05-17 MED ORDER — FREESTYLE LIBRE 3 SENSOR MISC: 1.0000 | 3 refills | Status: DC

## 2023-05-26 ENCOUNTER — Ambulatory Visit: Payer: PPO | Admitting: Family Medicine

## 2023-06-02 ENCOUNTER — Encounter: Payer: Self-pay | Admitting: Family Medicine

## 2023-06-02 ENCOUNTER — Ambulatory Visit (INDEPENDENT_AMBULATORY_CARE_PROVIDER_SITE_OTHER): Payer: PPO | Admitting: Family Medicine

## 2023-06-02 VITALS — BP 121/66 | HR 52 | Temp 97.2°F | Ht 70.0 in | Wt 192.6 lb

## 2023-06-02 DIAGNOSIS — I10 Essential (primary) hypertension: Secondary | ICD-10-CM | POA: Diagnosis not present

## 2023-06-02 DIAGNOSIS — E1169 Type 2 diabetes mellitus with other specified complication: Secondary | ICD-10-CM | POA: Diagnosis not present

## 2023-06-02 DIAGNOSIS — E1159 Type 2 diabetes mellitus with other circulatory complications: Secondary | ICD-10-CM | POA: Diagnosis not present

## 2023-06-02 DIAGNOSIS — Z23 Encounter for immunization: Secondary | ICD-10-CM

## 2023-06-02 DIAGNOSIS — E785 Hyperlipidemia, unspecified: Secondary | ICD-10-CM | POA: Diagnosis not present

## 2023-06-02 DIAGNOSIS — Z7984 Long term (current) use of oral hypoglycemic drugs: Secondary | ICD-10-CM

## 2023-06-02 LAB — LIPID PANEL
Chol/HDL Ratio: 4.1 {ratio} (ref 0.0–5.0)
Cholesterol, Total: 116 mg/dL (ref 100–199)
HDL: 28 mg/dL — ABNORMAL LOW (ref >39)
LDL Chol Calc (NIH): 64 mg/dL (ref 0–99)
Triglycerides: 134 mg/dL (ref 0–149)
VLDL Cholesterol Cal: 24 mg/dL (ref 5–40)

## 2023-06-02 LAB — CBC WITH DIFFERENTIAL/PLATELET
Basophils Absolute: 0.1 10*3/uL (ref 0.0–0.2)
Basos: 1 %
EOS (ABSOLUTE): 0.1 10*3/uL (ref 0.0–0.4)
Eos: 2 %
Hematocrit: 39.5 % (ref 37.5–51.0)
Hemoglobin: 13.1 g/dL (ref 13.0–17.7)
Immature Grans (Abs): 0 10*3/uL (ref 0.0–0.1)
Immature Granulocytes: 0 %
Lymphocytes Absolute: 1.1 10*3/uL (ref 0.7–3.1)
Lymphs: 18 %
MCH: 31.8 pg (ref 26.6–33.0)
MCHC: 33.2 g/dL (ref 31.5–35.7)
MCV: 96 fL (ref 79–97)
Monocytes Absolute: 0.6 10*3/uL (ref 0.1–0.9)
Monocytes: 10 %
Neutrophils Absolute: 4.4 10*3/uL (ref 1.4–7.0)
Neutrophils: 69 %
Platelets: 213 10*3/uL (ref 150–450)
RBC: 4.12 x10E6/uL — ABNORMAL LOW (ref 4.14–5.80)
RDW: 11.9 % (ref 11.6–15.4)
WBC: 6.4 10*3/uL (ref 3.4–10.8)

## 2023-06-02 LAB — CMP14+EGFR
ALT: 20 [IU]/L (ref 0–44)
AST: 17 [IU]/L (ref 0–40)
Albumin: 4.5 g/dL (ref 3.9–4.9)
Alkaline Phosphatase: 84 [IU]/L (ref 44–121)
BUN/Creatinine Ratio: 19 (ref 10–24)
BUN: 31 mg/dL — ABNORMAL HIGH (ref 8–27)
Bilirubin Total: 0.4 mg/dL (ref 0.0–1.2)
CO2: 21 mmol/L (ref 20–29)
Calcium: 9.3 mg/dL (ref 8.6–10.2)
Chloride: 106 mmol/L (ref 96–106)
Creatinine, Ser: 1.63 mg/dL — ABNORMAL HIGH (ref 0.76–1.27)
Globulin, Total: 2.5 g/dL (ref 1.5–4.5)
Glucose: 158 mg/dL — ABNORMAL HIGH (ref 70–99)
Potassium: 4.5 mmol/L (ref 3.5–5.2)
Sodium: 144 mmol/L (ref 134–144)
Total Protein: 7 g/dL (ref 6.0–8.5)
eGFR: 46 mL/min/{1.73_m2} — ABNORMAL LOW (ref >59)

## 2023-06-02 LAB — BAYER DCA HB A1C WAIVED: HB A1C (BAYER DCA - WAIVED): 8.2 % — ABNORMAL HIGH (ref 4.8–5.6)

## 2023-06-02 MED ORDER — FREESTYLE LIBRE 3 PLUS SENSOR MISC: 3 refills | Status: DC

## 2023-06-02 MED ORDER — ATORVASTATIN CALCIUM 40 MG PO TABS: 40.0000 mg | ORAL_TABLET | Freq: Every day | ORAL | 3 refills | Status: DC

## 2023-06-02 NOTE — Progress Notes (Signed)
BP 121/66   Pulse (!) 52   Temp (!) 97.2 F (36.2 C) (Temporal)   Ht 5\' 10"  (1.778 m)   Wt 192 lb 9.6 oz (87.4 kg)   SpO2 96%   BMI 27.64 kg/m    Subjective:   Patient ID: [name] [name], male    DOB: 06/09/1956, 67 y.o.   MRN: [id number]  HPI: [name] [name] is a 67 y.o. male presenting on 06/02/2023 for Medical Management of Chronic Issues (3 month)   HPI Type 2 diabetes mellitus Patient comes in today for recheck of his diabetes. Patient has been currently taking glipizide, taking 2 in the morning and 1 in the evening and [place] and metformin although he admits he is not taking the [place] consistently. Patient is not currently on an ACE inhibitor/ARB. Patient has seen an ophthalmologist this year. Patient denies any new issues with their feet. The symptom started onset as an adult hypertension and hyperlipidemia and A-fib and CAD and MASH ARE RELATED TO DM   Hypertension Patient is currently on amlodipine and atenolol, and their blood pressure today is 128/66. Patient denies any lightheadedness or dizziness. Patient denies headaches, blurred vision, chest pains, shortness of breath, or weakness. Denies any side effects from medication and is content with current medication.   Hyperlipidemia Patient is coming in for recheck of his hyperlipidemia. The patient is currently taking Lipitor. They deny any issues with myalgias or history of liver damage from it. They deny any focal numbness or weakness or chest pain.   Patient has been having some arthralgias in both hips and then he gets some pains coming down his legs and numbness sometimes coming down his legs on both the outsides of his legs.  He has had some injections in his back.  Relevant past medical, surgical, family and social history reviewed and updated as indicated. Interim medical history since our last visit reviewed. Allergies and medications reviewed and updated.  Review of Systems  Constitutional:  Negative for  chills and fever.  Eyes:  Negative for visual disturbance.  Respiratory:  Negative for shortness of breath and wheezing.   Cardiovascular:  Negative for chest pain and leg swelling.  Musculoskeletal:  Positive for arthralgias and myalgias. Negative for back pain and gait problem.  Skin:  Negative for rash.  All other systems reviewed and are negative.   Per HPI unless specifically indicated above   Allergies as of 06/02/2023       Reactions   Glyxambi [empagliflozin-linagliptin] Diarrhea, Itching   He was able to take Jardiance alone with no side effects so it was the Tradjenta part that most likely gave him the reaction   Hctz [hydrochlorothiazide]    Nausea and headache    Sulfa Antibiotics Nausea And Vomiting   Sulfonamide Derivatives         Medication List        Accurate as of June 02, 2023 10:53 AM. If you have any questions, ask your nurse or doctor.          acetaminophen 325 MG tablet Commonly known as: TYLENOL Take 2 tablets (650 mg total) by mouth every 6 (six) hours as needed for mild pain (or Fever >/= 101).   ALIGN PO Take by mouth daily.   amLODipine 10 MG tablet Commonly known as: NORVASC TAKE ONE TABLET DAILY   aspirin EC 81 MG tablet Take 1 tablet (81 mg total) by mouth daily. Swallow whole.   atenolol 25 MG tablet Commonly  known as: TENORMIN Take 1 tablet (25 mg total) by mouth daily.   atorvastatin 40 MG tablet Commonly known as: LIPITOR Take 1 tablet (40 mg total) by mouth daily.   blood glucose meter kit and supplies Kit Dispense based on patient and insurance preference. Use up to four times daily as directed. (FOR ICD-9 250.00, 250.01).   cholecalciferol 1000 units tablet Commonly known as: VITAMIN D Take 2,000 Units by mouth 2 (two) times daily.   diclofenac Sodium 1 % Gel Commonly known as: VOLTAREN SMARTSIG:2 Gram(s) Topical 3 Times Daily PRN   FreeStyle Libre 3 Plus Sensor Misc Change sensor every 15 days.    FreeStyle Libre 3 Reader Devi 1 each by Does not apply route 4 (four) times daily.   gabapentin 100 MG capsule Commonly known as: NEURONTIN Take 100 mg by mouth at bedtime.   glipiZIDE 5 MG tablet Commonly known as: GLUCOTROL Take 2 tablets (10 mg total) by mouth daily before breakfast AND 1 tablet (5 mg total) daily before supper.   metFORMIN 1000 MG tablet Commonly known as: GLUCOPHAGE Take 1 tablet (1,000 mg total) by mouth 2 (two) times daily with a meal.   Novofine Pen Needle 32G X 6 MM Misc Generic drug: Insulin Pen Needle 1 Needle by Does not apply route 3 (three) times daily.   OneTouch Delica Plus Lancet33G Misc 4 (four) times daily.   OneTouch Ultra test strip Generic drug: glucose blood Test BS 4 times daily Dx E11.69   temazepam 15 MG capsule Commonly known as: RESTORIL TAKE ONE CAPSULE BY MOUTH AT BEDTIME AS NEEDED What changed:  how much to take how to take this when to take this reasons to take this additional instructions   Tresiba FlexTouch 100 UNIT/ML FlexTouch Pen Generic drug: insulin degludec Inject 10 Units into the skin daily. What changed: how much to take         Objective:   BP 121/66   Pulse (!) 52   Temp (!) 97.2 F (36.2 C) (Temporal)   Ht 5\' 10"  (1.778 m)   Wt 192 lb 9.6 oz (87.4 kg)   SpO2 96%   BMI 27.64 kg/m   Wt Readings from Last 3 Encounters:  06/02/23 192 lb 9.6 oz (87.4 kg)  03/18/23 190 lb (86.2 kg)  02/26/23 182 lb (82.6 kg)    Physical Exam Vitals and nursing note reviewed.  Constitutional:      General: He is not in acute distress.    Appearance: He is well-developed. He is not diaphoretic.  Eyes:     General: No scleral icterus.    Conjunctiva/sclera: Conjunctivae normal.  Neck:     Thyroid: No thyromegaly.  Cardiovascular:     Rate and Rhythm: Normal rate and regular rhythm.     Heart sounds: Normal heart sounds. No murmur heard. Pulmonary:     Effort: Pulmonary effort is normal. No respiratory  distress.     Breath sounds: Normal breath sounds. No wheezing.  Musculoskeletal:        General: No swelling.     Cervical back: Neck supple.  Lymphadenopathy:     Cervical: No cervical adenopathy.  Skin:    General: Skin is warm and dry.     Findings: No rash.  Neurological:     Mental Status: He is alert and oriented to person, place, and time.     Coordination: Coordination normal.  Psychiatric:        Behavior: Behavior normal.  Assessment & Plan:   Problem List Items Addressed This Visit       Cardiovascular and Mediastinum   Essential hypertension     Endocrine   Type 2 diabetes mellitus (HCC)   Type 2 diabetes mellitus with hyperlipidemia (HCC) - Primary   Relevant Orders   CBC with Differential/Platelet   CMP14+EGFR   Lipid panel   Bayer DCA Hb A1c Waived     Other   Hyperlipidemia with target LDL less than 70   Other Visit Diagnoses     Encounter for immunization       Relevant Orders   Flu Vaccine Trivalent High Dose (Fluad) (Completed)     Sounds like the back is nerve impingement, recommended to continue to see back specialist.  A1c is up but he does admit that he is not taking his [place] every day, some days he will skip it if he feels like it is a little lower in the morning and then not take it.  He is post to be on Tresiba 14, encouraged that he needs to start taking it more consistently.  A1c was 8.2 today.  He did also have an injection in the past 3 months that may have affected that as well.  Follow up plan: Return in about 3 months (around 09/02/2023), or if symptoms worsen or fail to improve, for Diabetes recheck.  Counseling provided for all of the vaccine components Orders Placed This Encounter  Procedures   Flu Vaccine Trivalent High Dose (Fluad)   CBC with Differential/Platelet   CMP14+EGFR   Lipid panel   Bayer DCA Hb A1c Waived    Arville Care, MD [place] [facility] 06/02/2023, 10:53 AM

## 2023-06-08 DIAGNOSIS — L570 Actinic keratosis: Secondary | ICD-10-CM | POA: Diagnosis not present

## 2023-06-08 DIAGNOSIS — Z1283 Encounter for screening for malignant neoplasm of skin: Secondary | ICD-10-CM | POA: Diagnosis not present

## 2023-06-08 DIAGNOSIS — D485 Neoplasm of uncertain behavior of skin: Secondary | ICD-10-CM | POA: Diagnosis not present

## 2023-06-08 DIAGNOSIS — L814 Other melanin hyperpigmentation: Secondary | ICD-10-CM | POA: Diagnosis not present

## 2023-06-08 DIAGNOSIS — D0439 Carcinoma in situ of skin of other parts of face: Secondary | ICD-10-CM | POA: Diagnosis not present

## 2023-06-17 DIAGNOSIS — C44329 Squamous cell carcinoma of skin of other parts of face: Secondary | ICD-10-CM | POA: Diagnosis not present

## 2023-06-27 ENCOUNTER — Other Ambulatory Visit: Payer: Self-pay | Admitting: Family Medicine

## 2023-07-27 DIAGNOSIS — M9901 Segmental and somatic dysfunction of cervical region: Secondary | ICD-10-CM | POA: Diagnosis not present

## 2023-07-27 DIAGNOSIS — S233XXA Sprain of ligaments of thoracic spine, initial encounter: Secondary | ICD-10-CM | POA: Diagnosis not present

## 2023-07-27 DIAGNOSIS — S134XXA Sprain of ligaments of cervical spine, initial encounter: Secondary | ICD-10-CM | POA: Diagnosis not present

## 2023-07-27 DIAGNOSIS — M9903 Segmental and somatic dysfunction of lumbar region: Secondary | ICD-10-CM | POA: Diagnosis not present

## 2023-07-27 DIAGNOSIS — S338XXA Sprain of other parts of lumbar spine and pelvis, initial encounter: Secondary | ICD-10-CM | POA: Diagnosis not present

## 2023-07-27 DIAGNOSIS — M9902 Segmental and somatic dysfunction of thoracic region: Secondary | ICD-10-CM | POA: Diagnosis not present

## 2023-08-03 DIAGNOSIS — S233XXA Sprain of ligaments of thoracic spine, initial encounter: Secondary | ICD-10-CM | POA: Diagnosis not present

## 2023-08-03 DIAGNOSIS — M9903 Segmental and somatic dysfunction of lumbar region: Secondary | ICD-10-CM | POA: Diagnosis not present

## 2023-08-03 DIAGNOSIS — M9901 Segmental and somatic dysfunction of cervical region: Secondary | ICD-10-CM | POA: Diagnosis not present

## 2023-08-03 DIAGNOSIS — S134XXA Sprain of ligaments of cervical spine, initial encounter: Secondary | ICD-10-CM | POA: Diagnosis not present

## 2023-08-03 DIAGNOSIS — M9902 Segmental and somatic dysfunction of thoracic region: Secondary | ICD-10-CM | POA: Diagnosis not present

## 2023-08-03 DIAGNOSIS — S338XXA Sprain of other parts of lumbar spine and pelvis, initial encounter: Secondary | ICD-10-CM | POA: Diagnosis not present

## 2023-08-10 DIAGNOSIS — M9903 Segmental and somatic dysfunction of lumbar region: Secondary | ICD-10-CM | POA: Diagnosis not present

## 2023-08-10 DIAGNOSIS — S233XXA Sprain of ligaments of thoracic spine, initial encounter: Secondary | ICD-10-CM | POA: Diagnosis not present

## 2023-08-10 DIAGNOSIS — M9902 Segmental and somatic dysfunction of thoracic region: Secondary | ICD-10-CM | POA: Diagnosis not present

## 2023-08-10 DIAGNOSIS — S338XXA Sprain of other parts of lumbar spine and pelvis, initial encounter: Secondary | ICD-10-CM | POA: Diagnosis not present

## 2023-08-10 DIAGNOSIS — M9901 Segmental and somatic dysfunction of cervical region: Secondary | ICD-10-CM | POA: Diagnosis not present

## 2023-08-10 DIAGNOSIS — S134XXA Sprain of ligaments of cervical spine, initial encounter: Secondary | ICD-10-CM | POA: Diagnosis not present

## 2023-08-19 DIAGNOSIS — H524 Presbyopia: Secondary | ICD-10-CM | POA: Diagnosis not present

## 2023-08-19 DIAGNOSIS — H40013 Open angle with borderline findings, low risk, bilateral: Secondary | ICD-10-CM | POA: Diagnosis not present

## 2023-08-24 DIAGNOSIS — M9902 Segmental and somatic dysfunction of thoracic region: Secondary | ICD-10-CM | POA: Diagnosis not present

## 2023-08-24 DIAGNOSIS — S233XXA Sprain of ligaments of thoracic spine, initial encounter: Secondary | ICD-10-CM | POA: Diagnosis not present

## 2023-08-24 DIAGNOSIS — S338XXA Sprain of other parts of lumbar spine and pelvis, initial encounter: Secondary | ICD-10-CM | POA: Diagnosis not present

## 2023-08-24 DIAGNOSIS — M9903 Segmental and somatic dysfunction of lumbar region: Secondary | ICD-10-CM | POA: Diagnosis not present

## 2023-08-24 DIAGNOSIS — S134XXA Sprain of ligaments of cervical spine, initial encounter: Secondary | ICD-10-CM | POA: Diagnosis not present

## 2023-08-24 DIAGNOSIS — M9901 Segmental and somatic dysfunction of cervical region: Secondary | ICD-10-CM | POA: Diagnosis not present

## 2023-08-31 ENCOUNTER — Encounter: Payer: Self-pay | Admitting: *Deleted

## 2023-09-01 ENCOUNTER — Ambulatory Visit: Payer: PPO | Attending: Cardiology | Admitting: Cardiology

## 2023-09-01 ENCOUNTER — Encounter: Payer: Self-pay | Admitting: Cardiology

## 2023-09-01 VITALS — BP 122/66 | HR 60 | Ht 70.0 in | Wt 195.4 lb

## 2023-09-01 DIAGNOSIS — Z952 Presence of prosthetic heart valve: Secondary | ICD-10-CM | POA: Diagnosis not present

## 2023-09-01 DIAGNOSIS — I10 Essential (primary) hypertension: Secondary | ICD-10-CM | POA: Diagnosis not present

## 2023-09-01 DIAGNOSIS — R0600 Dyspnea, unspecified: Secondary | ICD-10-CM

## 2023-09-01 DIAGNOSIS — E782 Mixed hyperlipidemia: Secondary | ICD-10-CM

## 2023-09-01 DIAGNOSIS — R0602 Shortness of breath: Secondary | ICD-10-CM | POA: Diagnosis not present

## 2023-09-01 NOTE — Progress Notes (Signed)
 Clinical Summary [name] [name] is a 68 y.o.male seen today for follow up of the following medical problems      1. AVR - history of bicuspid AV, s/p bioprosthetic AVR in 2012 Jan 2023 echo normal AVR - 05/2022 echo normal AVR     - some recent DOE for about 1 year. He attribute to some bilateral hip pains and back pains. No recent edema. No chest pains.   2. Ascending aortic aneurysm - mild aortic root dilatation at 4.1 to 4.2 cm by last MRI 10/2013, likely related to hx of bicuspid AV and potential coexisting aortopathy - CTA 10/2014 4.1 cm ascending aortic aneurysm.   - CTA 11/2016 stable 4 cm aneurysm   10/2017 CTA Chest/Abd/Pelvis Sovah (in epic labeled as CT A/P): 4 cm aneurysm - no recent chest pain   01/2019 CTA stable 3.7 cm(reported overestimated by prior imaging) 01/2020 CTA 3.9 cm acending aorta, overall stable 06/2021 CT PE: no aneurysm   3. HTN - on higher dose of lisinopril had symptomatic low bp's - compliant with meds    4. OSA - not compliant with CPAP due to discomfort. Has not been intrested in retyring - not interested in inspire device   5. Hyperlipidemia  05/2022 TC 79 TG 63 HDL 25 LDL 41 11/2022 TC 104 TG 188 HDL 31 LDL 42. HgbA1c 7.8 last check - 05/2023 TC 116 TG 134 HDL 28 LDL 64   6. DM2 - followed by pcp - 05/2023 A1c 8.2     7. Pulmonary  Nodules - have been stable by imaging, monitored along with his aneurysm with his annual CT chests   8. Carotid stenosis - followed by vascular 09/2021 carotid US: mod bilateral disease  05/2022 RICA 60-79%, RICA 40-59% - 03/2023 RICA 60-79%, LICA 40-59%      9. Afib, isolated - new diagnosis during Jan 2023 admission - had just been discharged for severe case of COVID pneumonia, readmitted few days later with dehydration and AKI, found to be in afib with RVR.  - he asymptomatic - no bleeding on eliqus - dizziness on dilt 120.     02/2022 30 day monitor: no afib - denies any palpitations.     11. CKD IIIA Past Medical History:  Diagnosis Date   Aortic stenosis 8/09   Bicuspid aortic valve   Arthritis 6/10   hand    Atrial fibrillation (HCC)    post op only   Bile duct calculus with acute cholecystitis    CAD (coronary artery disease) 8/09   Calculus of common bile duct with acute pancreatitis 11/2016   CTS (carpal tunnel syndrome)    bilateral    Diabetes (HCC)    Diastolic dysfunction 8/09   Elevated LFTs    Fatty liver disease, nonalcoholic    History of kidney stones    Hyperlipidemia    Hypertension    Kidney stones    Nasal congestion    NIDDM (non-insulin dependent diabetes mellitus)    x15 yrs   OSA on CPAP 2006   Paresthesias in left hand 02/19/2004   Paresthesias in right hand 02/19/2004   Sinusitis    Vitamin D deficiency 06/13/09     Allergies  Allergen Reactions   Glyxambi [Empagliflozin-Linagliptin] Diarrhea and Itching    He was able to take Jardiance alone with no side effects so it was the Tradjenta part that most likely gave him the reaction   Hctz [Hydrochlorothiazide]  Nausea and headache    Sulfa Antibiotics Nausea And Vomiting   Sulfonamide Derivatives      Current Outpatient Medications  Medication Sig Dispense Refill   acetaminophen (TYLENOL) 325 MG tablet Take 2 tablets (650 mg total) by mouth every 6 (six) hours as needed for mild pain (or Fever >/= 101). 12 tablet 0   amLODipine (NORVASC) 10 MG tablet TAKE ONE TABLET DAILY 30 tablet 6   aspirin EC 81 MG tablet Take 1 tablet (81 mg total) by mouth daily. Swallow whole.     atenolol (TENORMIN) 25 MG tablet Take 1 tablet (25 mg total) by mouth daily. 90 tablet 3   atorvastatin (LIPITOR) 40 MG tablet Take 1 tablet (40 mg total) by mouth daily. 90 tablet 3   blood glucose meter kit and supplies KIT Dispense based on patient and insurance preference. Use up to four times daily as directed. (FOR ICD-9 250.00, 250.01). 1 each 0   cholecalciferol (VITAMIN D) 1000 UNITS tablet Take  2,000 Units by mouth 2 (two) times daily.     Continuous Glucose Receiver (FREESTYLE LIBRE 3 READER) DEVI 1 each by Does not apply route 4 (four) times daily. 1 each 1   Continuous Glucose Sensor (FREESTYLE LIBRE 3 PLUS SENSOR) MISC Change sensor every 15 days. 3 each 3   diclofenac Sodium (VOLTAREN) 1 % GEL SMARTSIG:2 Gram(s) Topical 3 Times Daily PRN     gabapentin (NEURONTIN) 100 MG capsule Take 100 mg by mouth at bedtime.     glipiZIDE (GLUCOTROL) 5 MG tablet TAKE 2 TABLETS BY MOUTH TWICE DAILY BEFORE MEALS 360 tablet 0   insulin degludec (TRESIBA FLEXTOUCH) 100 UNIT/ML FlexTouch Pen Inject 10 Units into the skin daily. (Patient taking differently: Inject 14 Units into the skin daily.) 15 mL 2   Insulin Pen Needle (NOVOFINE PEN NEEDLE) 32G X 6 MM MISC 1 Needle by Does not apply route 3 (three) times daily. 100 each 3   Lancets (ONETOUCH DELICA PLUS LANCET33G) MISC 4 (four) times daily.     metFORMIN (GLUCOPHAGE) 1000 MG tablet Take 1 tablet (1,000 mg total) by mouth 2 (two) times daily with a meal. 180 tablet 3   ONETOUCH ULTRA test strip Test BS 4 times daily Dx E11.69 400 each 3   Probiotic Product (ALIGN PO) Take by mouth daily.     temazepam (RESTORIL) 15 MG capsule TAKE ONE CAPSULE BY MOUTH AT BEDTIME AS NEEDED (Patient taking differently: Take 15 mg by mouth at bedtime as needed for sleep.) 30 capsule 1   No current facility-administered medications for this visit.     Past Surgical History:  Procedure Laterality Date   bladder cancer - operation - 01/21/18 - Dr Berneice Heinrich  01/21/2018   CARPAL TUNNEL RELEASE     bilateral    CHOLECYSTECTOMY  11/2016   COLONOSCOPY N/A 09/22/2018   Procedure: COLONOSCOPY;  Surgeon: Malissa Hippo, MD;  Location: [facility];  Service: Endoscopy;  Laterality: N/A;  1:00   CORONARY ANGIOPLASTY WITH STENT PLACEMENT     to rt coronary atery (Dr. Degert-cardiologist)   CYSTOSCOPY WITH RETROGRADE PYELOGRAM, URETEROSCOPY AND STENT PLACEMENT Left 03/07/2018    Procedure: CYSTOSCOPY WITH RETROGRADE PYELOGRAM,  AND STENT PLACEMENT;  Surgeon: Sebastian Ache, MD;  Location: [facility];  Service: Urology;  Laterality: Left;   ERCP N/A 11/24/2016   Procedure: ENDOSCOPIC RETROGRADE CHOLANGIOPANCREATOGRAPHY (ERCP);  Surgeon: Malissa Hippo, MD;  Location: [facility];  Service: Endoscopy;  Laterality: N/A;   EXTRACORPOREAL SHOCK  WAVE LITHOTRIPSY Left 03/17/2018   Procedure: LEFT EXTRACORPOREAL SHOCK WAVE LITHOTRIPSY (ESWL);  Surgeon: Bjorn Pippin, MD;  Location: [facility];  Service: Urology;  Laterality: Left;   REMOVAL OF STONES N/A 11/24/2016   Procedure: REMOVAL OF STONES;  Surgeon: Malissa Hippo, MD;  Location: [facility];  Service: Endoscopy;  Laterality: N/A;   SPHINCTEROTOMY N/A 11/24/2016   Procedure: SPHINCTEROTOMY;  Surgeon: Malissa Hippo, MD;  Location: [facility];  Service: Endoscopy;  Laterality: N/A;   TISSUE AORTIC VALVE REPLACEMENT     2012     Allergies  Allergen Reactions   Glyxambi [Empagliflozin-Linagliptin] Diarrhea and Itching    He was able to take Jardiance alone with no side effects so it was the Tradjenta part that most likely gave him the reaction   Hctz [Hydrochlorothiazide]     Nausea and headache    Sulfa Antibiotics Nausea And Vomiting   Sulfonamide Derivatives       Family History  Problem Relation Age of Onset   Hearing loss Mother    Alzheimer's disease Mother    Hearing loss Father    Diabetes Father    Diabetes Other        Family History      Social History Mr. Cuaresma reports that he quit smoking about 21 years ago. His smoking use included cigarettes. He started smoking about 49 years ago. He has a 42 pack-year smoking history. He has never been exposed to tobacco smoke. He has never used smokeless tobacco. Mr. Darnell reports no history of alcohol use.   [name] Physical Examination Today's Vitals   09/01/23 0915  BP: 122/66  Pulse: 60  SpO2: 97%  Weight: 195 lb 6.4 oz (88.6 kg)  Height: 5\' 10"   (1.778 m)   Body mass index is 28.04 kg/m.  Gen: resting comfortably, no acute distress HEENT: no scleral icterus, pupils equal round and reactive, no palptable cervical adenopathy,  CV: RRR, no mrg, no jvd Resp: Clear to auscultation bilaterally GI: abdomen is soft, non-tender, non-distended, normal bowel sounds, no hepatosplenomegaly MSK: extremities are warm, no edema.  Skin: warm, no rash Neuro:  no focal deficits Psych: appropriate affect   Diagnostic Studies 09/2012 MRI/MRA Chest Stable 4 cm ascending thoracic aneurysm   08/2010 Cath HEMODYNAMIC DATA: Right atrial pressures 7/5 with a mean of 4 mmHg,   right ventricular pressure is 24 with an EDP of 7 mmHg. Pulmonary   artery pressure is 20/7 with a mean of 13 mmHg. Pulmonary capillary   wedge pressure is 10/7 with a mean of 6 mmHg. Aortic pressure is 118/67   with a mean of 88 mmHg. Left ventricular pressure is 161 with an EDP of   12 mmHg. There is no significant mitral valve gradient. The aortic   valve mean gradient is 31 mmHg with calculated valve area of 0.8   centimeter squared. Thermodilution cardiac output is 4.4 L per minute   with an index of 2, Fick cardiac outputs 4.7 L per minute with an index   of 2.2.   ANGIOGRAPHIC DATA: The left ventricular angiography was performed in a   RAO view. This demonstrates normal left ventricular size and   contractility with normal systolic function. Ejection fraction is   estimated at 65%. The aortic valve is calcified with eccentric opening   and restrictive mobility.   The left coronary artery arises and distributes normally. The left main   coronary has 20% narrowing in the ostium and in the  distal left main.   The left anterior descending artery is a tortuous vessel which has less   than 10% irregularities.   Left circumflex coronary artery is rise to first obtuse marginal vessel   which has a 40-50% stenosis proximally.   The right coronary artery is a dominant vessel,  it has 20% disease in   the distal vessel. The stent in the midvessel was widely patent.   FINAL INTERPRETATION:   1. Nonobstructive atherosclerotic coronary artery disease.   2. Normal left ventricular function.   3. Severe aortic stenosis.   4. Normal right heart pressures.   PLAN: Proceed with aortic valve replacement.   10/2013 MRA Chest EXAM: MRA CHEST WITH OR WITHOUT CONTRAST   TECHNIQUE: Angiographic images of the chest were obtained using MRA technique without and with intravenous contrast.   CONTRAST:  20mL MULTIHANCE GADOBENATE DIMEGLUMINE 529 MG/ML IV SOLN   COMPARISON:  CT UROGRAM dated 05/08/2011; MR MRA CHEST W/ OR W/O CM dated 10/07/2012; CT ANGIO CHEST dated 09/08/2010   FINDINGS: There is stable aneurysmal dilatation of the aortic root that does not involve the sinuses of Valsalva. No aortic dissection is present. Artifact is noted from a prosthetic aortic valve. Diameter at the level of the sinuses of Valsalva is approximately 3.2 cm. Maximal caliber of the ascending thoracic aorta is 4.1- 4.2 cm. The proximal arch measures 3.8 cm. The distal arch measures 2.6 cm. The descending thoracic aorta measures 2.6 cm. Proximal great vessels show stable and normal patency without anatomical variant.   The heart size is normal. No pleural or pericardial fluid is identified. No masses or lymphadenopathy are seen. Visualized spine shows no abnormalities.   IMPRESSION: Stable aneurysmal dilatation of the ascending thoracic aorta measuring 4.1- 4.2 cm in greatest diameter. No dissection is identified.   10/2014 CTA Chest IMPRESSION: 1. Mild fusiform aneurysmal dilatation of the ascending thoracic aorta measuring approximately 41 mm in maximal diameter, stable since the 2012 examination. 2. Stable sequela of prior median sternotomy and aortic valve replacement. 3. Coronary artery calcifications. 4. Punctate (approximately 5 mm) in determine right lower lobe pulmonary  nodule, not definitely seen on the 20/2012 examination. If the patient is at high risk for bronchogenic carcinoma, follow-up chest CT at 6-12 months is recommended. If the patient is at low risk for bronchogenic carcinoma, follow-up chest CT at 12 months is recommended. This recommendation follows the consensus statement:   11/2015 CTA chest   IMPRESSION: Mild aneurysmal dilatation of the ascending aorta with a maximal diameter of 4.0 cm. This is not significantly changed.     11/2016 echo Study Conclusions   - Left ventricle: The cavity size was normal. Wall thickness was   normal. Systolic function was normal. The estimated ejection   fraction was in the range of 60% to 65%. Wall motion was normal;   there were no regional wall motion abnormalities. Left   ventricular diastolic function parameters were normal. - Aortic valve: There isa 25-mm Edwards pericardial Magna-Ease   valve model #[id number] in the AV position. Mildly calcified   annulus. Trileaflet; normal thickness leaflets. There is a mild   gradient across the prosthetic valve. Mean gradient (S): 12 mm   Hg. Mean gradient of 18 mmHg with pedhoff probe. Valve area   (VTI): 1.71 cm^2. - Technically adequate study.   11/2016 CTA chest IMPRESSION: 1. Stable mild fusiform aneurysm of the ascending aorta, measuring maximum diameter of 4.0 cm. Recommend annual imaging followup by CTA or MRA.  This recommendation follows 2010 ACCF/AHA/AATS/ACR/ASA/SCA/SCAI/SIR/STS/SVM Guidelines for the Diagnosis and Management of Patients with Thoracic Aortic Disease. Circulation. 2010; 121: W413-K440 2. Long-term stability of pulmonary nodules, consistent with benign process.     01/2019 Chest CTA IMPRESSION: 1. Stable appearance of ascending thoracic aorta. Maximal measurement is 3.7 cm. This was overestimated on axial imaging previously. Recommend annual imaging followup by CTA or MRA. This recommendation follows  2010 ACCF/AHA/AATS/ACR/ASA/SCA/SCAI/SIR/STS/SVM Guidelines for the Diagnosis and Management of Patients with Thoracic Aortic Disease. Circulation.2010; 121: N027-O536. Aortic aneurysm NOS (ICD10-I71.9) 2. Stable peripheral pleural based subcentimeter pulmonary nodules, likely inflammatory. No follow-up necessary. 3.  Aortic Atherosclerosis (ICD10-I70.0).   01/2020 CTA IMPRESSION: 1. Ascending aorta is at the upper limits of normal in size, stable. 2. Hepatic steatosis. Mild marginal irregularity the liver is indicative of cirrhosis. 3. Aortic atherosclerosis (ICD10-I70.0). Coronary artery calcification. 4.  Emphysema (ICD10-J43.9).       Jan 2023 echo   IMPRESSIONS     1. Left ventricular ejection fraction, by estimation, is 60 to 65%. The  left ventricle has normal function. The left ventricle has no regional  wall motion abnormalities. There is mild left ventricular hypertrophy.  Left ventricular diastolic parameters  were normal.   2. Right ventricular systolic function is normal. The right ventricular  size is normal. Tricuspid regurgitation signal is inadequate for assessing  PA pressure.   3. The mitral valve is degenerative. Trivial mitral valve regurgitation.  No evidence of mitral stenosis. The mean mitral valve gradient is 2.0  mmHg.   4. The aortic valve has been repaired/replaced. Aortic valve  regurgitation is not visualized. There is a 25 mm [facility] Ease  pericardial valve present in the aortic position. Aortic valve mean  gradient measures 6.5 mmHg. Grossly normal prosthetic  function.   5. The inferior vena cava is normal in size with greater than 50%  respiratory variability, suggesting right atrial pressure of 3 mmHg.     02/2022 monitor 30 day monitor   Min HR 51, Max HR 107, Avg HR 70   No symptoms reported   Telemetry tracings show normal sinus rhythm, no significant arrhythmias     05/2022 echo 1. Left ventricular ejection fraction, by  estimation, is 55 to 60%. The  left ventricle has normal function. The left ventricle has no regional  wall motion abnormalities. There is mild left ventricular hypertrophy.  Left ventricular diastolic parameters  are indeterminate. Elevated left atrial pressure.   2. Right ventricular systolic function is normal. The right ventricular  size is normal. There is normal pulmonary artery systolic pressure.   3. The mitral valve is normal in structure. No evidence of mitral valve  regurgitation. No evidence of mitral stenosis.   4. 25-mm Edwards pericardial valve is in the AV position. . The aortic  valve has been repaired/replaced. Aortic valve regurgitation is not  visualized. No aortic stenosis is present.   5. Aortic dilatation noted. There is mild dilatation of the aortic root,  measuring 40 mm.   6. The inferior vena cava is normal in size with greater than 50%  respiratory variability, suggesting right atrial pressure of 3 mmHg.    05/2022 carotid US Right Carotid: Velocities in the right ICA are consistent with a 60-79%                 stenosis, calcific plaque may obscure higher velocity.   Left Carotid: Velocities in the left ICA are consistent with a 40-59%  stenosis.   Vertebrals:  Bilateral vertebral arteries demonstrate antegrade flow.  Subclavians: Normal flow hemodynamics were seen in bilateral subclavian               arteries.     Assessment and Plan  1. Aortic valve replacement - hx of bicuspid AV, s/p bioprosthetic AVR in 2012 - reports some recent DOE, we will plan for repeat echo   2.. HTN -bp is at goal, conitnue current meds        3. Hyperlipidemia -LDL is at goal, continue current meds   4. Carotid stenosis -moderate bilateral disease, followed by vascular - continue medical therapy     Antoine Poche, M.D.

## 2023-09-01 NOTE — Patient Instructions (Addendum)

## 2023-09-08 ENCOUNTER — Encounter: Payer: Self-pay | Admitting: Family Medicine

## 2023-09-08 ENCOUNTER — Ambulatory Visit: Payer: PPO | Admitting: Family Medicine

## 2023-09-08 VITALS — BP 132/68 | HR 59 | Ht 70.0 in | Wt 196.0 lb

## 2023-09-08 DIAGNOSIS — I2510 Atherosclerotic heart disease of native coronary artery without angina pectoris: Secondary | ICD-10-CM

## 2023-09-08 DIAGNOSIS — E1122 Type 2 diabetes mellitus with diabetic chronic kidney disease: Secondary | ICD-10-CM | POA: Diagnosis not present

## 2023-09-08 DIAGNOSIS — Z7984 Long term (current) use of oral hypoglycemic drugs: Secondary | ICD-10-CM

## 2023-09-08 DIAGNOSIS — E785 Hyperlipidemia, unspecified: Secondary | ICD-10-CM

## 2023-09-08 DIAGNOSIS — N182 Chronic kidney disease, stage 2 (mild): Secondary | ICD-10-CM

## 2023-09-08 DIAGNOSIS — E1169 Type 2 diabetes mellitus with other specified complication: Secondary | ICD-10-CM

## 2023-09-08 DIAGNOSIS — I10 Essential (primary) hypertension: Secondary | ICD-10-CM

## 2023-09-08 DIAGNOSIS — E1159 Type 2 diabetes mellitus with other circulatory complications: Secondary | ICD-10-CM | POA: Diagnosis not present

## 2023-09-08 LAB — LIPID PANEL

## 2023-09-08 LAB — BAYER DCA HB A1C WAIVED: HB A1C (BAYER DCA - WAIVED): 7.6 % — ABNORMAL HIGH (ref 4.8–5.6)

## 2023-09-08 MED ORDER — ATENOLOL 25 MG PO TABS: 25.0000 mg | ORAL_TABLET | Freq: Every day | ORAL | 3 refills | Status: DC

## 2023-09-08 MED ORDER — METFORMIN HCL 1000 MG PO TABS
1000.0000 mg | ORAL_TABLET | Freq: Two times a day (BID) | ORAL | 3 refills | Status: AC
Start: 2023-09-08 — End: ?

## 2023-09-08 MED ORDER — GLIPIZIDE 5 MG PO TABS: 5.0000 mg | ORAL_TABLET | Freq: Two times a day (BID) | ORAL | 3 refills | Status: DC

## 2023-09-08 NOTE — Progress Notes (Signed)
 BP 132/68   Pulse (!) 59   Ht 5\' 10"  (1.778 m)   Wt 196 lb (88.9 kg)   SpO2 97%   BMI 28.12 kg/m    Subjective:   Patient ID: [name] [name], male    DOB: [birth year], 68 y.o.   MRN: [id number]  HPI: [name] [name] is a 68 y.o. male presenting on 09/08/2023 for Medical Management of Chronic Issues, Hypertension, and Diabetes   HPI Type 2 diabetes mellitus Patient comes in today for recheck of his diabetes. Patient has been currently taking glipizide and metformin. Patient is not currently on an ACE inhibitor/ARB. Patient has not seen an ophthalmologist this year. Patient denies any new issues with their feet. The symptom started onset as an adult hypertension and hyperlipidemia ARE RELATED TO DM   Hypertension Patient is currently on atenolol and amlodipine, and their blood pressure today is 132/68. Patient denies any lightheadedness or dizziness. Patient denies headaches, blurred vision, chest pains, shortness of breath, or weakness. Denies any side effects from medication and is content with current medication.   Hyperlipidemia Patient is coming in for recheck of his hyperlipidemia. The patient is currently taking atorvastatin. They deny any issues with myalgias or history of liver damage from it. They deny any focal numbness or weakness or chest pain.   Relevant past medical, surgical, family and social history reviewed and updated as indicated. Interim medical history since our last visit reviewed. Allergies and medications reviewed and updated.  Review of Systems  Constitutional:  Negative for chills and fever.  Eyes:  Negative for visual disturbance.  Respiratory:  Negative for shortness of breath and wheezing.   Cardiovascular:  Negative for chest pain and leg swelling.  Musculoskeletal:  Negative for back pain and gait problem.  Skin:  Negative for rash.  Neurological:  Negative for weakness and numbness.  All other systems reviewed and are negative.   Per HPI unless  specifically indicated above   Allergies as of 09/08/2023       Reactions   Glyxambi [empagliflozin-linagliptin] Diarrhea, Itching   He was able to take Jardiance alone with no side effects so it was the Tradjenta part that most likely gave him the reaction   Hctz [hydrochlorothiazide]    Nausea and headache    Sulfa Antibiotics Nausea And Vomiting   Sulfonamide Derivatives         Medication List        Accurate as of September 08, 2023 10:38 AM. If you have any questions, ask your nurse or doctor.          acetaminophen 325 MG tablet Commonly known as: TYLENOL Take 2 tablets (650 mg total) by mouth every 6 (six) hours as needed for mild pain (or Fever >/= 101).   ALIGN PO Take by mouth daily.   amLODipine 10 MG tablet Commonly known as: NORVASC TAKE ONE TABLET DAILY   aspirin EC 81 MG tablet Take 1 tablet (81 mg total) by mouth daily. Swallow whole.   atenolol 25 MG tablet Commonly known as: TENORMIN Take 1 tablet (25 mg total) by mouth daily.   atorvastatin 40 MG tablet Commonly known as: LIPITOR Take 1 tablet (40 mg total) by mouth daily.   blood glucose meter kit and supplies Kit Dispense based on patient and insurance preference. Use up to four times daily as directed. (FOR ICD-9 250.00, 250.01).   cholecalciferol 1000 units tablet Commonly known as: VITAMIN D Take 2,000 Units by mouth 2 (two)  times daily.   diclofenac Sodium 1 % Gel Commonly known as: VOLTAREN SMARTSIG:2 Gram(s) Topical 3 Times Daily PRN   FreeStyle Libre 3 Plus Sensor Misc Change sensor every 15 days.   FreeStyle Libre 3 Reader Devi 1 each by Does not apply route 4 (four) times daily.   gabapentin 100 MG capsule Commonly known as: NEURONTIN Take 100 mg by mouth at bedtime.   glipiZIDE 5 MG tablet Commonly known as: GLUCOTROL TAKE 2 TABLETS BY MOUTH TWICE DAILY BEFORE MEALS   metFORMIN 1000 MG tablet Commonly known as: GLUCOPHAGE Take 1 tablet (1,000 mg total) by mouth 2  (two) times daily with a meal.   Novofine Pen Needle 32G X 6 MM Misc Generic drug: Insulin Pen Needle 1 Needle by Does not apply route 3 (three) times daily.   OneTouch Delica Plus Lancet33G Misc 4 (four) times daily.   OneTouch Ultra test strip Generic drug: glucose blood Test BS 4 times daily Dx E11.69   polyethylene glycol 17 g packet Commonly known as: MIRALAX / GLYCOLAX Take 17 g by mouth daily as needed.   temazepam 15 MG capsule Commonly known as: RESTORIL TAKE ONE CAPSULE BY MOUTH AT BEDTIME AS NEEDED What changed:  how much to take how to take this when to take this reasons to take this additional instructions   Tresiba FlexTouch 100 UNIT/ML FlexTouch Pen Generic drug: insulin degludec Inject 10 Units into the skin daily. What changed: how much to take         Objective:   BP 132/68   Pulse (!) 59   Ht 5\' 10"  (1.778 m)   Wt 196 lb (88.9 kg)   SpO2 97%   BMI 28.12 kg/m   Wt Readings from Last 3 Encounters:  09/08/23 196 lb (88.9 kg)  09/01/23 195 lb 6.4 oz (88.6 kg)  06/02/23 192 lb 9.6 oz (87.4 kg)    Physical Exam Vitals and nursing note reviewed.  Constitutional:      General: He is not in acute distress.    Appearance: He is well-developed. He is not diaphoretic.  Eyes:     General: No scleral icterus.    Conjunctiva/sclera: Conjunctivae normal.  Neck:     Thyroid: No thyromegaly.  Cardiovascular:     Rate and Rhythm: Normal rate and regular rhythm.     Heart sounds: Normal heart sounds. No murmur heard. Pulmonary:     Effort: Pulmonary effort is normal. No respiratory distress.     Breath sounds: Normal breath sounds. No wheezing.  Musculoskeletal:        General: No swelling. Normal range of motion.     Cervical back: Neck supple.  Lymphadenopathy:     Cervical: No cervical adenopathy.  Skin:    General: Skin is warm and dry.     Findings: No rash.  Neurological:     Mental Status: He is alert and oriented to person, place, and  time.     Coordination: Coordination normal.  Psychiatric:        Behavior: Behavior normal.       Assessment & Plan:   Problem List Items Addressed This Visit       Cardiovascular and Mediastinum   Essential hypertension   Relevant Orders   CBC with Differential/Platelet   CMP14+EGFR   Lipid panel   Bayer DCA Hb A1c Waived   CAD (coronary artery disease)     Endocrine   Type 2 diabetes mellitus (HCC)   Type 2 diabetes  mellitus with hyperlipidemia (HCC)   Relevant Orders   CBC with Differential/Platelet   CMP14+EGFR   Lipid panel   Bayer DCA Hb A1c Waived     Genitourinary   CKD (chronic kidney disease) stage 2, GFR 60-89 ml/min     Other   Hyperlipidemia with target LDL less than 70 - Primary    A1c 7.6. His A1c looks better than last time so it is going in the right direction and he is not having any hypoglycemic episodes.  It looks like on his reader that most the time he was up just after lunchtime so I recommended that he switch one of his afternoon glipizide to take it right before lunchtime so that it will help mitigate some of those hyperglycemic episodes after lunchtime.  Also discussed doing diet and may be doing a little bit of exercise around lunchtime to help mitigate some of that hyperglycemia as well.  No change in medication. Follow up plan: Return in about 3 months (around 12/09/2023), or if symptoms worsen or fail to improve, for Diabetes recheck.  Counseling provided for all of the vaccine components Orders Placed This Encounter  Procedures   CBC with Differential/Platelet   CMP14+EGFR   Lipid panel   Bayer DCA Hb A1c Waived    Arville Care, MD [facility] 09/08/2023, 10:38 AM

## 2023-09-09 LAB — CBC WITH DIFFERENTIAL/PLATELET
Basophils Absolute: 0.1 10*3/uL (ref 0.0–0.2)
Basos: 1 %
EOS (ABSOLUTE): 0.2 10*3/uL (ref 0.0–0.4)
Eos: 3 %
Hematocrit: 39.3 % (ref 37.5–51.0)
Hemoglobin: 13.2 g/dL (ref 13.0–17.7)
Immature Grans (Abs): 0 10*3/uL (ref 0.0–0.1)
Immature Granulocytes: 0 %
Lymphocytes Absolute: 1.3 10*3/uL (ref 0.7–3.1)
Lymphs: 19 %
MCH: 32.4 pg (ref 26.6–33.0)
MCHC: 33.6 g/dL (ref 31.5–35.7)
MCV: 96 fL (ref 79–97)
Monocytes Absolute: 0.6 10*3/uL (ref 0.1–0.9)
Monocytes: 9 %
Neutrophils Absolute: 4.6 10*3/uL (ref 1.4–7.0)
Neutrophils: 68 %
Platelets: 201 10*3/uL (ref 150–450)
RBC: 4.08 x10E6/uL — ABNORMAL LOW (ref 4.14–5.80)
RDW: 12.4 % (ref 11.6–15.4)
WBC: 6.9 10*3/uL (ref 3.4–10.8)

## 2023-09-09 LAB — CMP14+EGFR
ALT: 28 IU/L (ref 0–44)
AST: 20 IU/L (ref 0–40)
Albumin: 4.7 g/dL (ref 3.9–4.9)
Alkaline Phosphatase: 81 IU/L (ref 44–121)
BUN/Creatinine Ratio: 18 (ref 10–24)
BUN: 27 mg/dL (ref 8–27)
Bilirubin Total: 0.5 mg/dL (ref 0.0–1.2)
CO2: 20 mmol/L (ref 20–29)
Calcium: 9.8 mg/dL (ref 8.6–10.2)
Chloride: 107 mmol/L — ABNORMAL HIGH (ref 96–106)
Creatinine, Ser: 1.47 mg/dL — ABNORMAL HIGH (ref 0.76–1.27)
Globulin, Total: 2.2 g/dL (ref 1.5–4.5)
Glucose: 156 mg/dL — ABNORMAL HIGH (ref 70–99)
Potassium: 4.8 mmol/L (ref 3.5–5.2)
Sodium: 143 mmol/L (ref 134–144)
Total Protein: 6.9 g/dL (ref 6.0–8.5)
eGFR: 52 mL/min/{1.73_m2} — ABNORMAL LOW (ref >59)

## 2023-09-09 LAB — LIPID PANEL
Cholesterol, Total: 121 mg/dL (ref 100–199)
HDL: 29 mg/dL — ABNORMAL LOW (ref >39)
LDL CALC COMMENT:: 4.2 ratio (ref 0.0–5.0)
LDL Chol Calc (NIH): 62 mg/dL (ref 0–99)
Triglycerides: 175 mg/dL — ABNORMAL HIGH (ref 0–149)
VLDL Cholesterol Cal: 30 mg/dL (ref 5–40)

## 2023-09-10 ENCOUNTER — Encounter: Payer: Self-pay | Admitting: Family Medicine

## 2023-09-15 NOTE — Progress Notes (Unsigned)
 HISTORY AND PHYSICAL     CC:  follow up. Requesting Provider:  Dettinger, [name], MD  HPI: This is a 68 y.o. male here for follow up for carotid artery stenosis.    A few years ago, he was noted to have a carotid bruit and had carotid duplex.  He has been followed for carotid artery stenosis yearly since then.    Pt was last seen 03/18/2023 and at that time he was not having any neurological sx.  He was riding exercise bike up to an hour at a time daily.    He does have aneurysm of his ascending aorta that measured 3.9cm in 2021 and cardiology is following. He has hx of AVR with pericardial valve in 2012 by Dr. Laneta Simmers   Pt returns today for follow up.    Pt denies any amaurosis fugax, speech difficulties, weakness, numbness, paralysis or clumsiness or facial droop.    He states he has been having trouble with his hips for about 3 years.  He stays active riding the stationary bike.  He is unable to walk longer distances due to his back.  He denies rest pain in his feet.    The pt is on a statin for cholesterol management.  The pt is on a daily aspirin.   Other AC:  none The pt is on CCB, BB for hypertension.   The pt is  on medication for diabetes Tobacco hx:  former  Pt does not have family hx of AAA.  Past Medical History:  Diagnosis Date   Aortic stenosis 8/09   Bicuspid aortic valve   Arthritis 6/10   hand    Atrial fibrillation (HCC)    post op only   Bile duct calculus with acute cholecystitis    CAD (coronary artery disease) 8/09   Calculus of common bile duct with acute pancreatitis 11/2016   CTS (carpal tunnel syndrome)    bilateral    Diabetes (HCC)    Diastolic dysfunction 8/09   Elevated LFTs    Fatty liver disease, nonalcoholic    History of kidney stones    Hyperlipidemia    Hypertension    Kidney stones    Nasal congestion    NIDDM (non-insulin dependent diabetes mellitus)    x15 yrs   OSA on CPAP 2006   Paresthesias in left hand 02/19/2004    Paresthesias in right hand 02/19/2004   Sinusitis    Vitamin D deficiency 06/13/09    Past Surgical History:  Procedure Laterality Date   bladder cancer - operation - 01/21/18 - Dr Berneice Heinrich  01/21/2018   CARPAL TUNNEL RELEASE     bilateral    CHOLECYSTECTOMY  11/2016   COLONOSCOPY N/A 09/22/2018   Procedure: COLONOSCOPY;  Surgeon: Malissa Hippo, MD;  Location: [facility];  Service: Endoscopy;  Laterality: N/A;  1:00   CORONARY ANGIOPLASTY WITH STENT PLACEMENT     to rt coronary atery (Dr. Degert-cardiologist)   CYSTOSCOPY WITH RETROGRADE PYELOGRAM, URETEROSCOPY AND STENT PLACEMENT Left 03/07/2018   Procedure: CYSTOSCOPY WITH RETROGRADE PYELOGRAM,  AND STENT PLACEMENT;  Surgeon: Sebastian Ache, MD;  Location: [facility];  Service: Urology;  Laterality: Left;   ERCP N/A 11/24/2016   Procedure: ENDOSCOPIC RETROGRADE CHOLANGIOPANCREATOGRAPHY (ERCP);  Surgeon: Malissa Hippo, MD;  Location: [facility];  Service: Endoscopy;  Laterality: N/A;   EXTRACORPOREAL SHOCK WAVE LITHOTRIPSY Left 03/17/2018   Procedure: LEFT EXTRACORPOREAL SHOCK WAVE LITHOTRIPSY (ESWL);  Surgeon: Bjorn Pippin, MD;  Location: [facility];  Service: Urology;  Laterality: Left;   REMOVAL OF STONES N/A 11/24/2016   Procedure: REMOVAL OF STONES;  Surgeon: Malissa Hippo, MD;  Location: [facility];  Service: Endoscopy;  Laterality: N/A;   SPHINCTEROTOMY N/A 11/24/2016   Procedure: SPHINCTEROTOMY;  Surgeon: Malissa Hippo, MD;  Location: [facility];  Service: Endoscopy;  Laterality: N/A;   TISSUE AORTIC VALVE REPLACEMENT     2012    Allergies  Allergen Reactions   Glyxambi [Empagliflozin-Linagliptin] Diarrhea and Itching    He was able to take Jardiance alone with no side effects so it was the Tradjenta part that most likely gave him the reaction   Hctz [Hydrochlorothiazide]     Nausea and headache    Sulfa Antibiotics Nausea And Vomiting   Sulfonamide Derivatives     Current Outpatient Medications  Medication Sig  Dispense Refill   acetaminophen (TYLENOL) 325 MG tablet Take 2 tablets (650 mg total) by mouth every 6 (six) hours as needed for mild pain (or Fever >/= 101). 12 tablet 0   amLODipine (NORVASC) 10 MG tablet TAKE ONE TABLET DAILY 30 tablet 6   aspirin EC 81 MG tablet Take 1 tablet (81 mg total) by mouth daily. Swallow whole.     atenolol (TENORMIN) 25 MG tablet Take 1 tablet (25 mg total) by mouth daily. 90 tablet 3   atorvastatin (LIPITOR) 40 MG tablet Take 1 tablet (40 mg total) by mouth daily. 90 tablet 3   blood glucose meter kit and supplies KIT Dispense based on patient and insurance preference. Use up to four times daily as directed. (FOR ICD-9 250.00, 250.01). 1 each 0   cholecalciferol (VITAMIN D) 1000 UNITS tablet Take 2,000 Units by mouth 2 (two) times daily.     Continuous Glucose Receiver (FREESTYLE LIBRE 3 READER) DEVI 1 each by Does not apply route 4 (four) times daily. 1 each 1   Continuous Glucose Sensor (FREESTYLE LIBRE 3 PLUS SENSOR) MISC Change sensor every 15 days. 3 each 3   diclofenac Sodium (VOLTAREN) 1 % GEL SMARTSIG:2 Gram(s) Topical 3 Times Daily PRN     gabapentin (NEURONTIN) 100 MG capsule Take 100 mg by mouth at bedtime.     glipiZIDE (GLUCOTROL) 5 MG tablet Take 1 tablet (5 mg total) by mouth 2 (two) times daily before a meal. 360 tablet 3   insulin degludec (TRESIBA FLEXTOUCH) 100 UNIT/ML FlexTouch Pen Inject 10 Units into the skin daily. (Patient taking differently: Inject 14 Units into the skin daily.) 15 mL 2   Insulin Pen Needle (NOVOFINE PEN NEEDLE) 32G X 6 MM MISC 1 Needle by Does not apply route 3 (three) times daily. 100 each 3   Lancets (ONETOUCH DELICA PLUS LANCET33G) MISC 4 (four) times daily.     metFORMIN (GLUCOPHAGE) 1000 MG tablet Take 1 tablet (1,000 mg total) by mouth 2 (two) times daily with a meal. 180 tablet 3   ONETOUCH ULTRA test strip Test BS 4 times daily Dx E11.69 400 each 3   polyethylene glycol (MIRALAX / GLYCOLAX) 17 g packet Take 17 g by  mouth daily as needed.     Probiotic Product (ALIGN PO) Take by mouth daily.     temazepam (RESTORIL) 15 MG capsule TAKE ONE CAPSULE BY MOUTH AT BEDTIME AS NEEDED (Patient taking differently: Take 15 mg by mouth at bedtime as needed for sleep.) 30 capsule 1   No current facility-administered medications for this visit.    Family History  Problem Relation Age of  Onset   Hearing loss Mother    Alzheimer's disease Mother    Hearing loss Father    Diabetes Father    Diabetes Other        Family History     Social History   Socioeconomic History   Marital status: Married    Spouse name: Aurther Loft   Number of children: 3   Years of education: Not on file   Highest education level: High school graduate  Occupational History   Occupation: Retired  Tobacco Use   Smoking status: Former    Current packs/day: 0.00    Average packs/day: 1.5 packs/day for 28.0 years (42.0 ttl pk-yrs)    Types: Cigarettes    Start date: 07/06/1974    Quit date: 07/06/2002    Years since quitting: 21.2    Passive exposure: Never   Smokeless tobacco: Never  Vaping Use   Vaping status: Never Used  Substance and Sexual Activity   Alcohol use: No    Alcohol/week: 0.0 standard drinks of alcohol   Drug use: No   Sexual activity: Not on file  Other Topics Concern   Not on file  Social History Narrative   Not on file   Social Drivers of Health   Financial Resource Strain: Low Risk  (12/04/2022)   Overall Financial Resource Strain (CARDIA)    Difficulty of Paying Living Expenses: Not hard at all  Food Insecurity: No Food Insecurity (12/04/2022)   Hunger Vital Sign    Worried About Running Out of Food in the Last Year: Never true    Ran Out of Food in the Last Year: Never true  Transportation Needs: No Transportation Needs (12/03/2022)   PRAPARE - Administrator, Civil Service (Medical): No    Lack of Transportation (Non-Medical): No  Physical Activity: Sufficiently Active (12/04/2022)   Exercise  Vital Sign    Days of Exercise per Week: 7 days    Minutes of Exercise per Session: 30 min  Stress: No Stress Concern Present (12/04/2022)   [employer] of Occupational Health - Occupational Stress Questionnaire    Feeling of Stress : Not at all  Social Connections: Socially Integrated (12/04/2022)   Social Connection and Isolation Panel [NHANES]    Frequency of Communication with Friends and Family: More than three times a week    Frequency of Social Gatherings with Friends and Family: More than three times a week    Attends Religious Services: More than 4 times per year    Active Member of [employer] or Organizations: Yes    Attends Engineer, structural: More than 4 times per year    Marital Status: Married  Catering manager Violence: Not At Risk (12/04/2022)   Humiliation, Afraid, Rape, and Kick questionnaire    Fear of Current or Ex-Partner: No    Emotionally Abused: No    Physically Abused: No    Sexually Abused: No     REVIEW OF SYSTEMS:   [X]  denotes positive finding, [ ]  denotes negative finding Cardiac  Comments:  Chest pain or chest pressure:    Shortness of breath upon exertion:    Short of breath when lying flat:    Irregular heart rhythm:        Vascular    Pain in calf, thigh, or hip brought on by ambulation:    Pain in feet at night that wakes you up from your sleep:     Blood clot in your veins:    Leg  swelling:         Pulmonary    Oxygen at home:    Productive cough:     Wheezing:         Neurologic    Sudden weakness in arms or legs:     Sudden numbness in arms or legs:     Sudden onset of difficulty speaking or slurred speech:    Temporary loss of vision in one eye:     Problems with dizziness:         Gastrointestinal    Blood in stool:     Vomited blood:         Genitourinary    Burning when urinating:     Blood in urine:        Psychiatric    Major depression:         Hematologic    Bleeding problems:    Problems with blood  clotting too easily:        Skin    Rashes or ulcers:        Constitutional    Fever or chills:      PHYSICAL EXAMINATION:  Today's Vitals   09/16/23 1005 09/16/23 1008  BP: (!) 145/68   Pulse: (!) 50   Temp: 98 F (36.7 C)   SpO2: 93%   Weight: 193 lb 6.4 oz (87.7 kg)   Height: 5\' 10"  (1.778 m)   PainSc: 0-No pain 0-No pain   Body mass index is 27.75 kg/m.   General:  WDWN in NAD; vital signs documented above Gait: Not observed HENT: WNL, normocephalic Pulmonary: normal non-labored breathing Cardiac: regular HR, without carotid bruits Abdomen: soft, NT; aortic pulse is not palpable Skin: without rashes Vascular Exam/Pulses:  Right Left  Radial 2+ (normal) 2+ (normal)  DP 2+ (normal) 1+ (weak)  PT 2+ (normal) Unable to palpate   Extremities: without open wounds Musculoskeletal: no muscle wasting or atrophy  Neurologic: A&O X 3; moving all extremities equally; speech is fluent/normal Psychiatric:  The pt has Normal affect.   Non-Invasive Vascular Imaging:   Carotid Duplex on 09/16/2023 Right:  40-59% ICA stenosis Left:  1-39% ICA stenosis Vertebrals:  Bilateral vertebral arteries demonstrate antegrade flow.  Subclavians: Normal flow hemodynamics were seen in bilateral subclavian arteries.   Previous Carotid duplex on 03/18/2023: Right: 60-79% ICA stenosis Left:   40-59% ICA stenosis    ASSESSMENT/PLAN:: 68 y.o. male here for follow up carotid artery stenosis. A few years ago, he was noted to have a carotid bruit and had carotid duplex.  He has been followed for carotid artery stenosis yearly since then.    -duplex today reveals velocities on the right were decreased from previous exam and now in the 40-59% range and decreased on the left now in the 1-39% range.   -discussed s/s of stroke with pt and he understands should he develop any of these sx, he will go to the nearest ER or call 911. -pt will f/u in 6 months with carotid duplex-if he remains in the  40-59% range, will continue with yearly duplex.  He will f/u in the [facility] clinic in [place].   -pt will call sooner should he have any issues. -continue statin/asa    Doreatha Massed, [facility] [phone]  Clinic MD:  Karin Lieu

## 2023-09-16 ENCOUNTER — Ambulatory Visit (HOSPITAL_COMMUNITY)
Admission: RE | Admit: 2023-09-16 | Discharge: 2023-09-16 | Disposition: A | Discharge status: Home / Self Care | Payer: PPO | Source: Ambulatory Visit | Attending: Vascular Surgery | Admitting: Vascular Surgery

## 2023-09-16 ENCOUNTER — Ambulatory Visit (INDEPENDENT_AMBULATORY_CARE_PROVIDER_SITE_OTHER): Payer: PPO

## 2023-09-16 VITALS — BP 145/68 | HR 50 | Temp 98.0°F | Ht 70.0 in | Wt 193.4 lb

## 2023-09-16 DIAGNOSIS — I6523 Occlusion and stenosis of bilateral carotid arteries: Secondary | ICD-10-CM

## 2023-09-17 ENCOUNTER — Other Ambulatory Visit: Payer: Self-pay | Admitting: *Deleted

## 2023-09-17 DIAGNOSIS — I6523 Occlusion and stenosis of bilateral carotid arteries: Secondary | ICD-10-CM

## 2023-09-20 ENCOUNTER — Encounter: Payer: Self-pay | Admitting: Family Medicine

## 2023-09-20 ENCOUNTER — Ambulatory Visit: Payer: PPO | Attending: Cardiology

## 2023-09-20 DIAGNOSIS — R0600 Dyspnea, unspecified: Secondary | ICD-10-CM | POA: Diagnosis not present

## 2023-09-20 DIAGNOSIS — I10 Essential (primary) hypertension: Secondary | ICD-10-CM

## 2023-09-20 DIAGNOSIS — E1169 Type 2 diabetes mellitus with other specified complication: Secondary | ICD-10-CM

## 2023-09-20 LAB — ECHOCARDIOGRAM COMPLETE
AR max vel: 1.59 cm2
AV Area VTI: 1.77 cm2
AV Area mean vel: 1.83 cm2
AV Mean grad: 6 mmHg
AV Peak grad: 14.1 mmHg
Ao pk vel: 1.88 m/s
Area-P 1/2: 3.58 cm2
Calc EF: 57.8 %
MV VTI: 1.67 cm2
S' Lateral: 3.5 cm
Single Plane A2C EF: 58.8 %
Single Plane A4C EF: 57.9 %

## 2023-09-20 MED ORDER — GLIPIZIDE 5 MG PO TABS: 10.0000 mg | ORAL_TABLET | Freq: Two times a day (BID) | ORAL | 3 refills | Status: DC

## 2023-09-20 NOTE — Telephone Encounter (Signed)
 Dr. Louanne Skye,  In pts med list Glipizide 5mg  is 1 tab BID. Will you verify how you would like for him to take it? In OV note it says take one at lunch and one in the afternoon.

## 2023-09-27 ENCOUNTER — Other Ambulatory Visit: Payer: Self-pay | Admitting: Cardiology

## 2023-09-30 ENCOUNTER — Other Ambulatory Visit: Payer: Self-pay | Admitting: Family Medicine

## 2023-10-24 ENCOUNTER — Encounter: Payer: Self-pay | Admitting: Family Medicine

## 2023-10-25 ENCOUNTER — Encounter: Payer: Self-pay | Admitting: *Deleted

## 2023-10-25 ENCOUNTER — Other Ambulatory Visit: Payer: Self-pay

## 2023-10-25 DIAGNOSIS — E1169 Type 2 diabetes mellitus with other specified complication: Secondary | ICD-10-CM

## 2023-10-25 DIAGNOSIS — I10 Essential (primary) hypertension: Secondary | ICD-10-CM

## 2023-10-25 MED ORDER — GLIPIZIDE 5 MG PO TABS: 10.0000 mg | ORAL_TABLET | Freq: Two times a day (BID) | ORAL | 3 refills | Status: AC

## 2023-12-06 ENCOUNTER — Ambulatory Visit: Payer: PPO

## 2023-12-06 VITALS — BP 145/68 | HR 50 | Ht 70.0 in | Wt 193.0 lb

## 2023-12-06 DIAGNOSIS — Z0000 Encounter for general adult medical examination without abnormal findings: Secondary | ICD-10-CM

## 2023-12-06 NOTE — Progress Notes (Signed)
 Subjective:   [name] [name] is a 68 y.o. who presents for a Medicare Wellness preventive visit.  As a reminder, Annual Wellness Visits don't include a physical exam, and some assessments may be limited, especially if this visit is performed virtually. We may recommend an in-person follow-up visit with your provider if needed.  Visit Complete: Virtual I connected with  [name] [name] on 12/06/23 by a audio enabled telemedicine application and verified that I am speaking with the correct person using two identifiers.  Patient Location: Home  Provider Location: Home Office  I discussed the limitations of evaluation and management by telemedicine. The patient expressed understanding and agreed to proceed.  Vital Signs: Because this visit was a virtual/telehealth visit, some criteria may be missing or patient reported. Any vitals not documented were not able to be obtained and vitals that have been documented are patient reported.  VideoDeclined- This patient declined Librarian, academic. Therefore the visit was completed with audio only.  Persons Participating in Visit: Patient.  AWV Questionnaire: No: Patient Medicare AWV questionnaire was not completed prior to this visit.  Cardiac Risk Factors include: advanced age (>10men, >23 women);male gender;dyslipidemia;hypertension     Objective:     Today's Vitals   12/06/23 1014  BP: (!) 145/68  Pulse: (!) 50  Weight: 193 lb (87.5 kg)  Height: 5\' 10"  (1.778 m)   Body mass index is 27.69 kg/m.     12/06/2023   10:04 AM 12/04/2022    1:22 PM 05/21/2022    9:53 PM 12/02/2021    1:27 PM 07/14/2021    9:00 AM 07/14/2021    4:55 AM 07/04/2021   11:00 PM  Advanced Directives  Does Patient Have a Medical Advance Directive? No No No No No No No  Would patient like information on creating a medical advance directive?  No - Patient declined  Yes (MAU/Ambulatory/Procedural Areas - Information given) No - Patient  declined No - Patient declined No - Patient declined    Current Medications (verified) Outpatient Encounter Medications as of 12/06/2023  Medication Sig   acetaminophen  (TYLENOL ) 325 MG tablet Take 2 tablets (650 mg total) by mouth every 6 (six) hours as needed for mild pain (or Fever >/= 101).   amLODipine  (NORVASC ) 10 MG tablet TAKE 1 TABLET BY MOUTH DAILY   aspirin  EC 81 MG tablet Take 1 tablet (81 mg total) by mouth daily. Swallow whole.   atenolol  (TENORMIN ) 25 MG tablet Take 1 tablet (25 mg total) by mouth daily.   atorvastatin  (LIPITOR) 40 MG tablet Take 1 tablet (40 mg total) by mouth daily.   blood glucose meter kit and supplies KIT Dispense based on patient and insurance preference. Use up to four times daily as directed. (FOR ICD-9 250.00, 250.01).   cholecalciferol (VITAMIN D ) 1000 UNITS tablet Take 2,000 Units by mouth 2 (two) times daily.   Continuous Glucose Receiver (FREESTYLE LIBRE 3 READER) DEVI 1 each by Does not apply route 4 (four) times daily.   Continuous Glucose Sensor (FREESTYLE LIBRE 3 PLUS SENSOR) MISC Change sensor every 15 days.   diclofenac Sodium (VOLTAREN) 1 % GEL SMARTSIG:2 Gram(s) Topical 3 Times Daily PRN   gabapentin (NEURONTIN) 100 MG capsule Take 100 mg by mouth at bedtime.   glipiZIDE  (GLUCOTROL ) 5 MG tablet Take 2 tablets (10 mg total) by mouth 2 (two) times daily before a meal.   insulin  degludec (TRESIBA  FLEXTOUCH) 100 UNIT/ML FlexTouch Pen Inject 10 Units into the skin daily. (  Patient taking differently: Inject 14 Units into the skin daily.)   Insulin  Pen Needle (NOVOFINE PEN NEEDLE) 32G X 6 MM MISC 1 Needle by Does not apply route 3 (three) times daily.   Insulin  Pen Needle (TRUEPLUS 5-BEVEL PEN NEEDLES) 31G X 6 MM MISC UAD TID Dx E11.69   Lancets (ONETOUCH DELICA PLUS LANCET33G) MISC 4 (four) times daily.   metFORMIN  (GLUCOPHAGE ) 1000 MG tablet Take 1 tablet (1,000 mg total) by mouth 2 (two) times daily with a meal.   ONETOUCH ULTRA test strip Test BS  4 times daily Dx E11.69   polyethylene glycol (MIRALAX  / GLYCOLAX ) 17 g packet Take 17 g by mouth daily as needed.   Probiotic Product (ALIGN PO) Take by mouth daily.   temazepam  (RESTORIL ) 15 MG capsule TAKE ONE CAPSULE BY MOUTH AT BEDTIME AS NEEDED (Patient taking differently: Take 15 mg by mouth at bedtime as needed for sleep.)   No facility-administered encounter medications on file as of 12/06/2023.    Allergies (verified) Glyxambi  [empagliflozin -linagliptin ], Hctz [hydrochlorothiazide], Sulfa antibiotics, and Sulfonamide derivatives   History: Past Medical History:  Diagnosis Date   Aortic stenosis 8/09   Bicuspid aortic valve   Arthritis 6/10   hand    Atrial fibrillation (HCC)    post op only   Bile duct calculus with acute cholecystitis    CAD (coronary artery disease) 8/09   Calculus of common bile duct with acute pancreatitis 11/2016   CTS (carpal tunnel syndrome)    bilateral    Diabetes (HCC)    Diastolic dysfunction 8/09   Elevated LFTs    Fatty liver disease, nonalcoholic    History of kidney stones    Hyperlipidemia    Hypertension    Kidney stones    Nasal congestion    NIDDM (non-insulin  dependent diabetes mellitus)    x15 yrs   OSA on CPAP 2006   Paresthesias in left hand 02/19/2004   Paresthesias in right hand 02/19/2004   Sinusitis    Vitamin D  deficiency 06/13/09   Past Surgical History:  Procedure Laterality Date   bladder cancer - operation - 01/21/18 - Dr Secundino Dach  01/21/2018   CARPAL TUNNEL RELEASE     bilateral    CHOLECYSTECTOMY  11/2016   COLONOSCOPY N/A 09/22/2018   Procedure: COLONOSCOPY;  Surgeon: Ruby Corporal, MD;  Location: [facility];  Service: Endoscopy;  Laterality: N/A;  1:00   CORONARY ANGIOPLASTY WITH STENT PLACEMENT     to rt coronary atery (Dr. Degert-cardiologist)   CYSTOSCOPY WITH RETROGRADE PYELOGRAM, URETEROSCOPY AND STENT PLACEMENT Left 03/07/2018   Procedure: CYSTOSCOPY WITH RETROGRADE PYELOGRAM,  AND STENT PLACEMENT;   Surgeon: Osborn Blaze, MD;  Location: [facility];  Service: Urology;  Laterality: Left;   ERCP N/A 11/24/2016   Procedure: ENDOSCOPIC RETROGRADE CHOLANGIOPANCREATOGRAPHY (ERCP);  Surgeon: Ruby Corporal, MD;  Location: [facility];  Service: Endoscopy;  Laterality: N/A;   EXTRACORPOREAL SHOCK WAVE LITHOTRIPSY Left 03/17/2018   Procedure: LEFT EXTRACORPOREAL SHOCK WAVE LITHOTRIPSY (ESWL);  Surgeon: Homero Luster, MD;  Location: [facility];  Service: Urology;  Laterality: Left;   REMOVAL OF STONES N/A 11/24/2016   Procedure: REMOVAL OF STONES;  Surgeon: Ruby Corporal, MD;  Location: [facility];  Service: Endoscopy;  Laterality: N/A;   SPHINCTEROTOMY N/A 11/24/2016   Procedure: SPHINCTEROTOMY;  Surgeon: Ruby Corporal, MD;  Location: [facility];  Service: Endoscopy;  Laterality: N/A;   TISSUE AORTIC VALVE REPLACEMENT     2012  Family History  Problem Relation Age of Onset   Hearing loss Mother    Alzheimer's disease Mother    Hearing loss Father    Diabetes Father    Diabetes Other        Family History    Social History   Socioeconomic History   Marital status: Married    Spouse name: Blaise Bumps   Number of children: 3   Years of education: Not on file   Highest education level: High school graduate  Occupational History   Occupation: Retired  Tobacco Use   Smoking status: Former    Current packs/day: 0.00    Average packs/day: 1.5 packs/day for 28.0 years (42.0 ttl pk-yrs)    Types: Cigarettes    Start date: 07/06/1974    Quit date: 07/06/2002    Years since quitting: 21.4    Passive exposure: Never   Smokeless tobacco: Never  Vaping Use   Vaping status: Never Used  Substance and Sexual Activity   Alcohol use: No    Alcohol/week: 0.0 standard drinks of alcohol   Drug use: No   Sexual activity: Not on file  Other Topics Concern   Not on file  Social History Narrative   Not on file   Social Drivers of Health   Financial Resource Strain: Low Risk  (12/06/2023)   Overall  Financial Resource Strain (CARDIA)    Difficulty of Paying Living Expenses: Not hard at all  Food Insecurity: No Food Insecurity (12/06/2023)   Hunger Vital Sign    Worried About Running Out of Food in the Last Year: Never true    Ran Out of Food in the Last Year: Never true  Transportation Needs: No Transportation Needs (12/06/2023)   PRAPARE - Administrator, Civil Service (Medical): No    Lack of Transportation (Non-Medical): No  Physical Activity: Sufficiently Active (12/06/2023)   Exercise Vital Sign    Days of Exercise per Week: 5 days    Minutes of Exercise per Session: 60 min  Stress: No Stress Concern Present (12/06/2023)   [employer] of Occupational Health - Occupational Stress Questionnaire    Feeling of Stress : Not at all  Social Connections: Moderately Integrated (12/06/2023)   Social Connection and Isolation Panel [NHANES]    Frequency of Communication with Friends and Family: Twice a week    Frequency of Social Gatherings with Friends and Family: Twice a week    Attends Religious Services: More than 4 times per year    Active Member of [employer] or Organizations: No    Attends Engineer, structural: Never    Marital Status: Married    Tobacco Counseling Counseling given: Yes    Clinical Intake:  Pre-visit preparation completed: Yes  Pain : No/denies pain     BMI - recorded: 27.69 Nutritional Status: BMI 25 -29 Overweight Nutritional Risks: None Diabetes: Yes CBG done?: Yes (183 this morning before pt ate)  Lab Results  Component Value Date   HGBA1C 7.6 (H) 09/08/2023   HGBA1C 8.2 (H) 06/02/2023   HGBA1C 7.4 (H) 02/26/2023     How often do you need to have someone help you when you read instructions, pamphlets, or other written materials from your doctor or pharmacy?: 1 - Never  Interpreter Needed?: No  Information entered by :: Alia t/cma   Activities of Daily Living     12/06/2023   10:00 AM  In your present state of health,  do you have any difficulty performing  the following activities:  Hearing? 1  Vision? 0  Comment pt goes Dr. Nolon Baxter in [place]/last ov in Jan.2025  Difficulty concentrating or making decisions? 0  Walking or climbing stairs? 1  Dressing or bathing? 0  Doing errands, shopping? 0  Preparing Food and eating ? N  Using the Toilet? N  In the past six months, have you accidently leaked urine? N  Do you have problems with loss of bowel control? N  Managing your Medications? N  Managing your Finances? N  Housekeeping or managing your Housekeeping? N    Patient Care Team: Dettinger, Lucio Sabin, MD as PCP - General (Family Medicine) Amanda Jungling Joyceann No, MD as PCP - Cardiology (Cardiology) Delilah Fend, [facility] (Pharmacist) Shermon Divine, MD as Consulting Physician (Family Medicine)  I have updated your Care Teams any recent Medical Services you may have received from other providers in the past year.     Assessment:    This is a routine wellness examination for [name] [name].  Hearing/Vision screen Hearing Screening - Comments:: Pt wear hearing aids Vision Screening - Comments:: Pt goes to Dr. Nolon Baxter in [place]/last appt Jan.2025   Goals Addressed             This Visit's Progress    DIET - EAT MORE FRUITS AND VEGETABLES   On track    Patient Stated       Make a Will       Depression Screen     12/06/2023   10:09 AM 09/08/2023   10:20 AM 06/02/2023   10:20 AM 02/26/2023    9:49 AM 12/04/2022    1:19 PM 12/04/2022    1:18 PM 11/23/2022    2:46 PM  PHQ 2/9 Scores  PHQ - 2 Score 0 0 0 0 0 0 1  PHQ- 9 Score   2 2 0 0 5    Fall Risk     12/06/2023   10:04 AM 09/08/2023   10:20 AM 06/02/2023   10:20 AM 02/26/2023    9:49 AM 12/03/2022    1:37 PM  Fall Risk   Falls in the past year? 0 0 0 0 0  Number falls in past yr: 0  0    Injury with Fall? 0  0  0  Risk for fall due to : No Fall Risks  No Fall Risks    Follow up Falls evaluation completed  Falls evaluation completed  Falls  prevention discussed;Falls evaluation completed    MEDICARE RISK AT HOME:  Medicare Risk at Home Any stairs in or around the home?: Yes If so, are there any without handrails?: No Home free of loose throw rugs in walkways, pet beds, electrical cords, etc?: Yes Adequate lighting in your home to reduce risk of falls?: Yes Life alert?: No Use of a cane, walker or w/c?: No Grab bars in the bathroom?: No Shower chair or bench in shower?: Yes Elevated toilet seat or a handicapped toilet?: Yes  TIMED UP AND GO:  Was the test performed?  no  Cognitive Function: 6CIT completed    11/29/2020    2:32 PM  MMSE - Mini Mental State Exam  Orientation to time 5  Orientation to Place 5  Registration 3  Attention/ Calculation 4  Recall 3  Language- name 2 objects 2  Language- repeat 1  Language- follow 3 step command 3  Language- read & follow direction 1  Write a sentence 1  Copy design 1  Total score 29  12/06/2023   10:10 AM 12/04/2022    1:20 PM 12/02/2021    1:35 PM  6CIT Screen  What Year? 0 points 0 points 0 points  What month? 0 points  0 points  What time? 0 points 0 points 0 points  Count back from 20 0 points 0 points 0 points  Months in reverse 0 points 0 points 0 points  Repeat phrase 0 points 0 points 0 points  Total Score 0 points  0 points    Immunizations Immunization History  Administered Date(s) Administered   Fluad Quad(high Dose 65+) 05/07/2021, 05/13/2022   Fluad Trivalent(High Dose 65+) 06/02/2023   Influenza,inj,Quad PF,6+ Mos 05/17/2013, 05/03/2014, 04/25/2015, 04/10/2016, 04/23/2017, 05/05/2018, 04/20/2019, 04/26/2020   Pneumococcal Conjugate-13 11/24/2013   Pneumococcal Polysaccharide-23 04/20/2019   Td 11/11/2009   Tdap 11/11/2009, 01/25/2020   Unspecified SARS-COV-2 Vaccination 09/13/2019, 10/09/2019, 06/24/2020   Zoster Recombinant(Shingrix ) 10/23/2021, 02/05/2022   Zoster, Live 03/03/2011    Screening Tests Health Maintenance  Topic  Date Due   COLON CANCER SCREENING ANNUAL FOBT  03/10/2024 (Originally 06/06/2022)   COVID-19 Vaccine (4 - 2024-25 season) 12/21/2024 (Originally 03/07/2023)   INFLUENZA VACCINE  02/04/2024   Diabetic kidney evaluation - Urine ACR  02/26/2024   FOOT EXAM  02/26/2024   HEMOGLOBIN A1C  03/10/2024   Pneumonia Vaccine 44+ Years old (3 of 3 - PCV20 or PCV21) 04/19/2024   OPHTHALMOLOGY EXAM  08/18/2024   Diabetic kidney evaluation - eGFR measurement  09/07/2024   Medicare Annual Wellness (AWV)  12/05/2024   Colonoscopy  09/21/2028   DTaP/Tdap/Td (4 - Td or Tdap) 01/24/2030   Hepatitis C Screening  Completed   Zoster Vaccines- Shingrix   Completed   HPV VACCINES  Aged Out   Meningococcal B Vaccine  Aged Out    Health Maintenance  There are no preventive care reminders to display for this patient.  Health Maintenance Items Addressed: See Nurse Notes at the end of this note  Additional Screening:  Vision Screening: Recommended annual ophthalmology exams for early detection of glaucoma and other disorders of the eye. Would you like a referral to an eye doctor? No    Dental Screening: Recommended annual dental exams for proper oral hygiene  Community Resource Referral / Chronic Care Management: CRR required this visit?  No   CCM required this visit?  No   Plan:    I have personally reviewed and noted the following in the patient's chart:   Medical and social history Use of alcohol, tobacco or illicit drugs  Current medications and supplements including opioid prescriptions. Patient is not currently taking opioid prescriptions. Functional ability and status Nutritional status Physical activity Advanced directives List of other physicians Hospitalizations, surgeries, and ER visits in previous 12 months Vitals Screenings to include cognitive, depression, and falls Referrals and appointments  In addition, I have reviewed and discussed with patient certain preventive protocols,  quality metrics, and best practice recommendations. A written personalized care plan for preventive services as well as general preventive health recommendations were provided to patient.   Michaelle Adolphus, CMA   12/06/2023   After Visit Summary: (MyChart) Due to this being a telephonic visit, the after visit summary with patients personalized plan was offered to patient via MyChart   Notes: Nothing significant to report at this time.

## 2023-12-06 NOTE — Patient Instructions (Signed)
 Mr. [name] [name] , Thank you for taking time out of your busy schedule to complete your Annual Wellness Visit with me. I enjoyed our conversation and look forward to speaking with you again next year. I, as well as your care team,  appreciate your ongoing commitment to your health goals. Please review the following plan we discussed and let me know if I can assist you in the future. Your Game plan/ To Do List    Follow up Visits: Next Medicare AWV with our clinical staff: 12/06/24 at 10:00a.m.   Have you seen your provider in the last 6 months (3 months if uncontrolled diabetes)? Yes Next Office Visit with your provider: 12/09/23 at 9:40a.m.  Clinician Recommendations:  Aim for 30 minutes of exercise or brisk walking, 6-8 glasses of water , and 5 servings of fruits and vegetables each day.       This is a list of the screening recommended for you and due dates:  Health Maintenance  Topic Date Due   Stool Blood Test  03/10/2024*   COVID-19 Vaccine (4 - 2024-25 season) 12/21/2024*   Flu Shot  02/04/2024   Yearly kidney health urinalysis for diabetes  02/26/2024   Complete foot exam   02/26/2024   Hemoglobin A1C  03/10/2024   Pneumonia Vaccine (3 of 3 - PCV20 or PCV21) 04/19/2024   Eye exam for diabetics  08/18/2024   Yearly kidney function blood test for diabetes  09/07/2024   Medicare Annual Wellness Visit  12/05/2024   Colon Cancer Screening  09/21/2028   DTaP/Tdap/Td vaccine (4 - Td or Tdap) 01/24/2030   Hepatitis C Screening  Completed   Zoster (Shingles) Vaccine  Completed   HPV Vaccine  Aged Out   Meningitis B Vaccine  Aged Out  *Topic was postponed. The date shown is not the original due date.    Advanced directives: (Declined) Advance directive discussed with you today. Even though you declined this today, please call our office should you change your mind, and we can give you the proper paperwork for you to fill out. Advance Care Planning is important because it:  [x]  Makes sure you  receive the medical care that is consistent with your values, goals, and preferences  [x]  It provides guidance to your family and loved ones and reduces their decisional burden about whether or not they are making the right decisions based on your wishes.  Follow the link provided in your after visit summary or read over the paperwork we have mailed to you to help you started getting your Advance Directives in place. If you need assistance in completing these, please reach out to us  so that we can help you! See attachments for Preventive Care and Fall Prevention Tips.

## 2023-12-09 ENCOUNTER — Encounter: Payer: Self-pay | Admitting: Family Medicine

## 2023-12-09 ENCOUNTER — Ambulatory Visit: Admitting: Family Medicine

## 2023-12-09 VITALS — BP 131/83 | HR 49 | Ht 70.0 in | Wt 194.0 lb

## 2023-12-09 DIAGNOSIS — N182 Chronic kidney disease, stage 2 (mild): Secondary | ICD-10-CM

## 2023-12-09 DIAGNOSIS — E1169 Type 2 diabetes mellitus with other specified complication: Secondary | ICD-10-CM | POA: Diagnosis not present

## 2023-12-09 DIAGNOSIS — E785 Hyperlipidemia, unspecified: Secondary | ICD-10-CM | POA: Diagnosis not present

## 2023-12-09 DIAGNOSIS — I2510 Atherosclerotic heart disease of native coronary artery without angina pectoris: Secondary | ICD-10-CM

## 2023-12-09 DIAGNOSIS — E1122 Type 2 diabetes mellitus with diabetic chronic kidney disease: Secondary | ICD-10-CM

## 2023-12-09 DIAGNOSIS — G4709 Other insomnia: Secondary | ICD-10-CM | POA: Diagnosis not present

## 2023-12-09 DIAGNOSIS — Z7984 Long term (current) use of oral hypoglycemic drugs: Secondary | ICD-10-CM

## 2023-12-09 DIAGNOSIS — Z794 Long term (current) use of insulin: Secondary | ICD-10-CM | POA: Diagnosis not present

## 2023-12-09 DIAGNOSIS — I10 Essential (primary) hypertension: Secondary | ICD-10-CM

## 2023-12-09 LAB — BAYER DCA HB A1C WAIVED: HB A1C (BAYER DCA - WAIVED): 7.8 % — ABNORMAL HIGH (ref 4.8–5.6)

## 2023-12-09 LAB — LIPID PANEL

## 2023-12-09 NOTE — Progress Notes (Signed)
 BP 131/83   Pulse (!) 49   Ht 5\' 10"  (1.778 m)   Wt 194 lb (88 kg)   SpO2 97%   BMI 27.84 kg/m    Subjective:   Patient ID: [name] [name], male    DOB: 03/29/1956, 68 y.o.   MRN: [id number]  HPI: [name] [name] is a 68 y.o. male presenting on 12/09/2023 for Medical Management of Chronic Issues, Diabetes, Hypertension, and Chronic Kidney Disease   HPI Type 2 diabetes mellitus Patient comes in today for recheck of his diabetes. Patient has been currently taking glipizide  and metformin  and Tresiba  14 units. Patient is not currently on an ACE inhibitor/ARB. Patient has not seen an ophthalmologist this year. Patient denies any new issues with their feet. The symptom started onset as an adult hyperlipidemia and hypertension and A-fib CAD and CKD nonalcoholic fatty liver disease ARE RELATED TO DM   Hypertension Patient is currently on amlodipine  and atenolol , and their blood pressure today is 131/83. Patient denies any lightheadedness or dizziness. Patient denies headaches, blurred vision, chest pains, shortness of breath, or weakness. Denies any side effects from medication and is content with current medication.   Hyperlipidemia and CAD recheck Patient is coming in for recheck of his hyperlipidemia. The patient is currently taking atorvastatin . They deny any issues with myalgias or history of liver damage from it. They deny any focal numbness or weakness or chest pain.   Relevant past medical, surgical, family and social history reviewed and updated as indicated. Interim medical history since our last visit reviewed. Allergies and medications reviewed and updated.  Review of Systems  Constitutional:  Negative for chills and fever.  Eyes:  Negative for visual disturbance.  Respiratory:  Negative for shortness of breath and wheezing.   Cardiovascular:  Negative for chest pain and leg swelling.  Musculoskeletal:  Negative for back pain and gait problem.  Skin:  Negative for rash.   Neurological:  Negative for dizziness and light-headedness.  All other systems reviewed and are negative.   Per HPI unless specifically indicated above   Allergies as of 12/09/2023       Reactions   Glyxambi  [empagliflozin -linagliptin ] Diarrhea, Itching   He was able to take Jardiance  alone with no side effects so it was the Tradjenta  part that most likely gave him the reaction   Hctz [hydrochlorothiazide]    Nausea and headache    Sulfa Antibiotics Nausea And Vomiting   Sulfonamide Derivatives         Medication List        Accurate as of December 09, 2023 10:22 AM. If you have any questions, ask your nurse or doctor.          acetaminophen  325 MG tablet Commonly known as: TYLENOL  Take 2 tablets (650 mg total) by mouth every 6 (six) hours as needed for mild pain (or Fever >/= 101).   ALIGN PO Take by mouth daily.   amLODipine  10 MG tablet Commonly known as: NORVASC  TAKE 1 TABLET BY MOUTH DAILY   aspirin  EC 81 MG tablet Take 1 tablet (81 mg total) by mouth daily. Swallow whole.   atenolol  25 MG tablet Commonly known as: TENORMIN  Take 1 tablet (25 mg total) by mouth daily.   atorvastatin  40 MG tablet Commonly known as: LIPITOR Take 1 tablet (40 mg total) by mouth daily.   blood glucose meter kit and supplies Kit Dispense based on patient and insurance preference. Use up to four times daily as directed. (FOR  ICD-9 250.00, 250.01).   cholecalciferol 1000 units tablet Commonly known as: VITAMIN D  Take 2,000 Units by mouth 2 (two) times daily.   diclofenac Sodium 1 % Gel Commonly known as: VOLTAREN SMARTSIG:2 Gram(s) Topical 3 Times Daily PRN   FreeStyle Libre 3 Plus Sensor Misc Change sensor every 15 days.   FreeStyle Libre 3 Reader Devi 1 each by Does not apply route 4 (four) times daily.   gabapentin 100 MG capsule Commonly known as: NEURONTIN Take 100 mg by mouth at bedtime.   glipiZIDE  5 MG tablet Commonly known as: GLUCOTROL  Take 2 tablets (10 mg  total) by mouth 2 (two) times daily before a meal.   metFORMIN  1000 MG tablet Commonly known as: GLUCOPHAGE  Take 1 tablet (1,000 mg total) by mouth 2 (two) times daily with a meal.   Novofine Pen Needle 32G X 6 MM Misc Generic drug: Insulin  Pen Needle 1 Needle by Does not apply route 3 (three) times daily.   TRUEplus 5-Bevel Pen Needles 31G X 6 MM Misc Generic drug: Insulin  Pen Needle UAD TID Dx E11.69   OneTouch Delica Plus Lancet33G Misc 4 (four) times daily.   OneTouch Ultra test strip Generic drug: glucose blood Test BS 4 times daily Dx E11.69   polyethylene glycol 17 g packet Commonly known as: MIRALAX  / GLYCOLAX  Take 17 g by mouth daily as needed.   temazepam  15 MG capsule Commonly known as: RESTORIL  TAKE ONE CAPSULE BY MOUTH AT BEDTIME AS NEEDED What changed:  how much to take how to take this when to take this reasons to take this additional instructions   Tresiba  FlexTouch 100 UNIT/ML FlexTouch Pen Generic drug: insulin  degludec Inject 10 Units into the skin daily. What changed: how much to take         Objective:   BP 131/83   Pulse (!) 49   Ht 5\' 10"  (1.778 m)   Wt 194 lb (88 kg)   SpO2 97%   BMI 27.84 kg/m   Wt Readings from Last 3 Encounters:  12/09/23 194 lb (88 kg)  12/06/23 193 lb (87.5 kg)  09/16/23 193 lb 6.4 oz (87.7 kg)    Physical Exam Vitals and nursing note reviewed.  Constitutional:      General: He is not in acute distress.    Appearance: He is well-developed. He is not diaphoretic.  Eyes:     General: No scleral icterus.    Conjunctiva/sclera: Conjunctivae normal.  Neck:     Thyroid : No thyromegaly.  Cardiovascular:     Rate and Rhythm: Normal rate and regular rhythm.     Heart sounds: Normal heart sounds. No murmur heard. Pulmonary:     Effort: Pulmonary effort is normal. No respiratory distress.     Breath sounds: Normal breath sounds. No wheezing.  Musculoskeletal:        General: No swelling. Normal range of  motion.     Cervical back: Neck supple.  Lymphadenopathy:     Cervical: No cervical adenopathy.  Skin:    General: Skin is warm and dry.     Findings: No rash.  Neurological:     Mental Status: He is alert and oriented to person, place, and time.     Coordination: Coordination normal.  Psychiatric:        Behavior: Behavior normal.       Assessment & Plan:   Problem List Items Addressed This Visit       Cardiovascular and Mediastinum   Essential hypertension  Relevant Orders   Bayer DCA Hb A1c Waived   CBC with Differential/Platelet   Lipid panel   CMP14+EGFR   CAD (coronary artery disease)     Endocrine   Type 2 diabetes mellitus (HCC)   Type 2 diabetes mellitus with hyperlipidemia (HCC) - Primary   Relevant Orders   Bayer DCA Hb A1c Waived   CBC with Differential/Platelet   Lipid panel   CMP14+EGFR     Genitourinary   CKD (chronic kidney disease) stage 2, GFR 60-89 ml/min     Other   Hyperlipidemia with target LDL less than 70   Relevant Orders   Bayer DCA Hb A1c Waived   CBC with Differential/Platelet   Lipid panel   CMP14+EGFR   INSOMNIA    A1c slightly up at 7.8, it looks like based on freestyle libre he is spiking in the middle of the day around lunchtime or right after lunchtime.  Instructed him to try and space out that no little bit more and we will go up on the Tresiba  to 16 units because he is averaging about 180 the rest of the day.  Blood pressure and everything else looks good no changes Follow up plan: Return in about 3 months (around 03/10/2024), or if symptoms worsen or fail to improve, for Diabetes recheck.  Counseling provided for all of the vaccine components Orders Placed This Encounter  Procedures   Bayer DCA Hb A1c Waived   CBC with Differential/Platelet   Lipid panel   CMP14+EGFR    Jolyne Needs, MD [facility] 12/09/2023, 10:22 AM

## 2023-12-13 DIAGNOSIS — L814 Other melanin hyperpigmentation: Secondary | ICD-10-CM | POA: Diagnosis not present

## 2023-12-13 DIAGNOSIS — L821 Other seborrheic keratosis: Secondary | ICD-10-CM | POA: Diagnosis not present

## 2023-12-13 DIAGNOSIS — Z85828 Personal history of other malignant neoplasm of skin: Secondary | ICD-10-CM | POA: Diagnosis not present

## 2023-12-13 DIAGNOSIS — L570 Actinic keratosis: Secondary | ICD-10-CM | POA: Diagnosis not present

## 2023-12-13 LAB — CBC WITH DIFFERENTIAL/PLATELET
Basophils Absolute: 0 10*3/uL (ref 0.0–0.2)
Basos: 0 %
EOS (ABSOLUTE): 0.1 10*3/uL (ref 0.0–0.4)
Eos: 2 %
Hematocrit: 39.7 % (ref 37.5–51.0)
Hemoglobin: 13 g/dL (ref 13.0–17.7)
Immature Grans (Abs): 0 10*3/uL (ref 0.0–0.1)
Immature Granulocytes: 0 %
Lymphocytes Absolute: 1.2 10*3/uL (ref 0.7–3.1)
Lymphs: 18 %
MCH: 32.2 pg (ref 26.6–33.0)
MCHC: 32.7 g/dL (ref 31.5–35.7)
MCV: 98 fL — ABNORMAL HIGH (ref 79–97)
Monocytes Absolute: 0.6 10*3/uL (ref 0.1–0.9)
Monocytes: 9 %
Neutrophils Absolute: 4.8 10*3/uL (ref 1.4–7.0)
Neutrophils: 71 %
Platelets: 199 10*3/uL (ref 150–450)
RBC: 4.04 x10E6/uL — ABNORMAL LOW (ref 4.14–5.80)
RDW: 12.5 % (ref 11.6–15.4)
WBC: 6.8 10*3/uL (ref 3.4–10.8)

## 2023-12-13 LAB — CMP14+EGFR
ALT: 28 IU/L (ref 0–44)
AST: 23 IU/L (ref 0–40)
Albumin: 4.5 g/dL (ref 3.9–4.9)
Alkaline Phosphatase: 85 IU/L (ref 44–121)
BUN/Creatinine Ratio: 16 (ref 10–24)
BUN: 25 mg/dL (ref 8–27)
Bilirubin Total: 0.4 mg/dL (ref 0.0–1.2)
CO2: 17 mmol/L — ABNORMAL LOW (ref 20–29)
Calcium: 9.3 mg/dL (ref 8.6–10.2)
Chloride: 104 mmol/L (ref 96–106)
Creatinine, Ser: 1.61 mg/dL — ABNORMAL HIGH (ref 0.76–1.27)
Globulin, Total: 2.3 g/dL (ref 1.5–4.5)
Glucose: 153 mg/dL — ABNORMAL HIGH (ref 70–99)
Potassium: 4.7 mmol/L (ref 3.5–5.2)
Sodium: 140 mmol/L (ref 134–144)
Total Protein: 6.8 g/dL (ref 6.0–8.5)
eGFR: 46 mL/min/{1.73_m2} — ABNORMAL LOW (ref >59)

## 2023-12-13 LAB — LIPID PANEL
Chol/HDL Ratio: 3.4 ratio (ref 0.0–5.0)
Cholesterol, Total: 98 mg/dL — ABNORMAL LOW (ref 100–199)
HDL: 29 mg/dL — ABNORMAL LOW (ref >39)
LDL Chol Calc (NIH): 48 mg/dL (ref 0–99)
Triglycerides: 113 mg/dL (ref 0–149)
VLDL Cholesterol Cal: 21 mg/dL (ref 5–40)

## 2023-12-17 ENCOUNTER — Ambulatory Visit: Payer: Self-pay | Admitting: Family Medicine

## 2023-12-30 ENCOUNTER — Encounter (HOSPITAL_COMMUNITY): Payer: Self-pay

## 2023-12-30 DIAGNOSIS — S022XXA Fracture of nasal bones, initial encounter for closed fracture: Secondary | ICD-10-CM | POA: Diagnosis not present

## 2023-12-30 DIAGNOSIS — R0689 Other abnormalities of breathing: Secondary | ICD-10-CM | POA: Diagnosis not present

## 2023-12-30 DIAGNOSIS — I10 Essential (primary) hypertension: Secondary | ICD-10-CM | POA: Diagnosis not present

## 2023-12-30 DIAGNOSIS — E119 Type 2 diabetes mellitus without complications: Secondary | ICD-10-CM | POA: Diagnosis not present

## 2023-12-30 DIAGNOSIS — S0990XA Unspecified injury of head, initial encounter: Secondary | ICD-10-CM | POA: Diagnosis not present

## 2023-12-30 DIAGNOSIS — S0083XA Contusion of other part of head, initial encounter: Secondary | ICD-10-CM | POA: Diagnosis not present

## 2023-12-30 DIAGNOSIS — R42 Dizziness and giddiness: Secondary | ICD-10-CM | POA: Diagnosis not present

## 2023-12-30 DIAGNOSIS — R55 Syncope and collapse: Secondary | ICD-10-CM | POA: Diagnosis not present

## 2023-12-30 DIAGNOSIS — X58XXXA Exposure to other specified factors, initial encounter: Secondary | ICD-10-CM | POA: Diagnosis not present

## 2023-12-30 DIAGNOSIS — J342 Deviated nasal septum: Secondary | ICD-10-CM | POA: Diagnosis not present

## 2023-12-30 DIAGNOSIS — R001 Bradycardia, unspecified: Secondary | ICD-10-CM | POA: Diagnosis not present

## 2023-12-31 ENCOUNTER — Observation Stay (HOSPITAL_COMMUNITY)
Admission: AD | Admit: 2023-12-31 | Discharge: 2024-01-01 | Disposition: A | Discharge status: Home / Self Care | Source: Other Acute Inpatient Hospital | Attending: Cardiology | Admitting: Cardiology

## 2023-12-31 ENCOUNTER — Encounter (HOSPITAL_COMMUNITY)
Admission: AD | Disposition: A | Discharge status: Home / Self Care | Payer: Self-pay | Source: Other Acute Inpatient Hospital | Attending: Cardiovascular Disease

## 2023-12-31 ENCOUNTER — Other Ambulatory Visit: Payer: Self-pay

## 2023-12-31 DIAGNOSIS — Z794 Long term (current) use of insulin: Secondary | ICD-10-CM | POA: Diagnosis not present

## 2023-12-31 DIAGNOSIS — I4891 Unspecified atrial fibrillation: Secondary | ICD-10-CM | POA: Diagnosis not present

## 2023-12-31 DIAGNOSIS — R7989 Other specified abnormal findings of blood chemistry: Secondary | ICD-10-CM | POA: Insufficient documentation

## 2023-12-31 DIAGNOSIS — S022XXA Fracture of nasal bones, initial encounter for closed fracture: Secondary | ICD-10-CM | POA: Diagnosis not present

## 2023-12-31 DIAGNOSIS — E1122 Type 2 diabetes mellitus with diabetic chronic kidney disease: Secondary | ICD-10-CM | POA: Insufficient documentation

## 2023-12-31 DIAGNOSIS — Y92002 Bathroom of unspecified non-institutional (private) residence single-family (private) house as the place of occurrence of the external cause: Secondary | ICD-10-CM | POA: Insufficient documentation

## 2023-12-31 DIAGNOSIS — E785 Hyperlipidemia, unspecified: Secondary | ICD-10-CM | POA: Diagnosis not present

## 2023-12-31 DIAGNOSIS — W1811XA Fall from or off toilet without subsequent striking against object, initial encounter: Secondary | ICD-10-CM | POA: Insufficient documentation

## 2023-12-31 DIAGNOSIS — Z952 Presence of prosthetic heart valve: Secondary | ICD-10-CM | POA: Diagnosis not present

## 2023-12-31 DIAGNOSIS — Y9301 Activity, walking, marching and hiking: Secondary | ICD-10-CM | POA: Diagnosis not present

## 2023-12-31 DIAGNOSIS — Z79899 Other long term (current) drug therapy: Secondary | ICD-10-CM | POA: Insufficient documentation

## 2023-12-31 DIAGNOSIS — I129 Hypertensive chronic kidney disease with stage 1 through stage 4 chronic kidney disease, or unspecified chronic kidney disease: Secondary | ICD-10-CM | POA: Diagnosis not present

## 2023-12-31 DIAGNOSIS — R55 Syncope and collapse: Principal | ICD-10-CM | POA: Insufficient documentation

## 2023-12-31 DIAGNOSIS — N189 Chronic kidney disease, unspecified: Secondary | ICD-10-CM | POA: Diagnosis not present

## 2023-12-31 DIAGNOSIS — Z87891 Personal history of nicotine dependence: Secondary | ICD-10-CM | POA: Diagnosis not present

## 2023-12-31 DIAGNOSIS — Z8679 Personal history of other diseases of the circulatory system: Secondary | ICD-10-CM | POA: Diagnosis not present

## 2023-12-31 HISTORY — PX: LOOP RECORDER INSERTION: EP1214

## 2023-12-31 LAB — CBC
HCT: 41 % (ref 39.0–52.0)
Hemoglobin: 13.6 g/dL (ref 13.0–17.0)
MCH: 32.4 pg (ref 26.0–34.0)
MCHC: 33.2 g/dL (ref 30.0–36.0)
MCV: 97.6 fL (ref 80.0–100.0)
Platelets: 244 10*3/uL (ref 150–400)
RBC: 4.2 MIL/uL — ABNORMAL LOW (ref 4.22–5.81)
RDW: 12.4 % (ref 11.5–15.5)
WBC: 9.7 10*3/uL (ref 4.0–10.5)
nRBC: 0 % (ref 0.0–0.2)

## 2023-12-31 LAB — BASIC METABOLIC PANEL WITH GFR
Anion gap: 14 (ref 5–15)
BUN: 27 mg/dL — ABNORMAL HIGH (ref 8–23)
CO2: 23 mmol/L (ref 22–32)
Calcium: 9.4 mg/dL (ref 8.9–10.3)
Chloride: 102 mmol/L (ref 98–111)
Creatinine, Ser: 1.84 mg/dL — ABNORMAL HIGH (ref 0.61–1.24)
GFR, Estimated: 39 mL/min — ABNORMAL LOW (ref >60)
Glucose, Bld: 208 mg/dL — ABNORMAL HIGH (ref 70–99)
Potassium: 4 mmol/L (ref 3.5–5.1)
Sodium: 139 mmol/L (ref 135–145)

## 2023-12-31 LAB — HIV ANTIBODY (ROUTINE TESTING W REFLEX): HIV Screen 4th Generation wRfx: NONREACTIVE

## 2023-12-31 LAB — GLUCOSE, CAPILLARY: Glucose-Capillary: 254 mg/dL — ABNORMAL HIGH (ref 70–99)

## 2023-12-31 SURGERY — LOOP RECORDER INSERTION

## 2023-12-31 MED ORDER — LIDOCAINE-EPINEPHRINE 1 %-1:100000 IJ SOLN
INTRAMUSCULAR | Status: DC | PRN
  Administered 2023-12-31: 20 mL

## 2023-12-31 MED ORDER — TEMAZEPAM 7.5 MG PO CAPS: 15.0000 mg | ORAL_CAPSULE | Freq: Every evening | ORAL | Status: DC | PRN

## 2023-12-31 MED ORDER — ACETAMINOPHEN 325 MG PO TABS: 650.0000 mg | ORAL_TABLET | Freq: Four times a day (QID) | ORAL | Status: DC | PRN

## 2023-12-31 MED ORDER — DICLOFENAC SODIUM 1 % EX GEL
2.0000 g | Freq: Four times a day (QID) | CUTANEOUS | Status: DC
  Filled 2023-12-31: qty 100

## 2023-12-31 MED ORDER — ATORVASTATIN CALCIUM 40 MG PO TABS
40.0000 mg | ORAL_TABLET | Freq: Every day | ORAL | Status: DC
  Administered 2024-01-01: 40 mg via ORAL
  Filled 2023-12-31: qty 1

## 2023-12-31 MED ORDER — LIDOCAINE-EPINEPHRINE 1 %-1:100000 IJ SOLN
INTRAMUSCULAR | Status: AC
  Filled 2023-12-31: qty 1

## 2023-12-31 MED ORDER — GABAPENTIN 100 MG PO CAPS
100.0000 mg | ORAL_CAPSULE | Freq: Every day | ORAL | Status: DC
  Filled 2023-12-31: qty 1

## 2023-12-31 MED ORDER — AMLODIPINE BESYLATE 10 MG PO TABS
10.0000 mg | ORAL_TABLET | Freq: Every day | ORAL | Status: DC
  Administered 2024-01-01: 10 mg via ORAL
  Filled 2023-12-31: qty 1

## 2023-12-31 MED ORDER — HEPARIN SODIUM (PORCINE) 5000 UNIT/ML IJ SOLN
5000.0000 [IU] | Freq: Three times a day (TID) | INTRAMUSCULAR | Status: DC
  Administered 2023-12-31 – 2024-01-01 (×2): 5000 [IU] via SUBCUTANEOUS
  Filled 2023-12-31 (×2): qty 1

## 2023-12-31 MED ORDER — ASPIRIN 81 MG PO TBEC
81.0000 mg | DELAYED_RELEASE_TABLET | Freq: Every day | ORAL | Status: DC
  Administered 2023-12-31 – 2024-01-01 (×2): 81 mg via ORAL
  Filled 2023-12-31 (×2): qty 1

## 2023-12-31 MED ORDER — POLYETHYLENE GLYCOL 3350 17 G PO PACK: 17.0000 g | PACK | Freq: Every day | ORAL | Status: DC | PRN

## 2023-12-31 SURGICAL SUPPLY — 2 items
MONITOR REVEAL LINQ II (Prosthesis & Implant Heart) IMPLANT
PACK LOOP INSERTION (CUSTOM PROCEDURE TRAY) ×1 IMPLANT

## 2023-12-31 NOTE — Interval H&P Note (Signed)
 History and Physical Interval Note:  12/31/2023 5:42 PM  [name] [name]  has presented today for surgery, with the diagnosis of syncope.  The various methods of treatment have been discussed with the patient and family. After consideration of risks, benefits and other options for treatment, the patient has consented to  Procedure(s): LOOP RECORDER INSERTION (N/A) as a surgical intervention.  The patient's history has been reviewed, patient examined, no change in status, stable for surgery.  I have reviewed the patient's chart and labs.  Questions were answered to the patient's satisfaction.    See consult H&P for additional details.  Breckyn Ticas E Nera Haworth

## 2023-12-31 NOTE — H&P (Addendum)
 Cardiology Admission History and Physical   Patient ID: [name] [name] MRN: [id number]; DOB: 1955/08/21   Admission date: 12/31/2023  PCP:  Dettinger, Fonda LABOR, MD   [facility] Providers Cardiologist:  Alvan Carrier, MD       Chief Complaint: Syncope, transfer from [facility]  Patient Profile: [name] [name] is a 68 y.o. male  12/31/2023 history of prior remote stent, aortic valve replacement 2012 for the evaluation of syncope.  History of Present Illness: [name] [name] 68 year old male who presented to [facility] after he fell after getting up from the toilet at approximately 10 AM yesterday had a normal bowel movement (takes MiraLAX ) was walking towards the bedroom door in the hallway and does not remember hitting the ground but he fell face first, large amount of blood coming from his mouth.  Wife was present.  Was out for about 1 minute.  He gently remembers his wife saying his name.  Was seeing some stars.  He has a history of bicuspid aortic valve with aortic valve replacement in 2012.  After his aortic valve replacement he does remember having a similar episode.  He had postoperative atrial fibrillation at that time.  No longer on Eliquis .  In 2023 he ended up having a 30-day event monitor that showed no evidence of atrial fibrillation.  Also has moderate carotid artery stenosis.  Vascular has been following.  While he was in the emergency department he had a CT scan of his head which showed a comminuted nasal fracture for which ENT was called by the ER physician.  They we will see him back in the outpatient setting.  They recommended ice.  Luckily his head CT also showed no abnormalities, no contusion no bleeding.  He did have a forehead laceration that was corrected in the ER.  He has been taking amlodipine  10 mg, atenolol  25 mg, atorvastatin  40 mg for hypertension/hyperlipidemia.  He also has type 2 diabetes on insulin .  He has not been taking Eliquis  5 mg, diltiazem  SR  90 mg every 12 hours, lisinopril  10 mg.  Last hemoglobin A1c 7.8 followed at [facility] family medicine by Dr. Maryanne  Last echocardiogram on 09/20/2023 shows EF of 60 to 65% with grade 1 diastolic dysfunction and 25 mm Edwards pericardial Magna Ease valve in the aortic position was functioning normally.  Mild dilation of the ascending aorta 40 mm   Past Medical History:  Diagnosis Date   Aortic stenosis 8/09   Bicuspid aortic valve   Arthritis 6/10   hand    Atrial fibrillation (HCC)    post op only   Bile duct calculus with acute cholecystitis    CAD (coronary artery disease) 8/09   Calculus of common bile duct with acute pancreatitis 11/2016   CTS (carpal tunnel syndrome)    bilateral    Diabetes (HCC)    Diastolic dysfunction 8/09   Elevated LFTs    Fatty liver disease, nonalcoholic    History of kidney stones    Hyperlipidemia    Hypertension    Kidney stones    Nasal congestion    NIDDM (non-insulin  dependent diabetes mellitus)    x15 yrs   OSA on CPAP 2006   Paresthesias in left hand 02/19/2004   Paresthesias in right hand 02/19/2004   Sinusitis    Vitamin D  deficiency 06/13/09   Past Surgical History:  Procedure Laterality Date   bladder cancer - operation - 01/21/18 - Dr Alvaro  01/21/2018   CARPAL TUNNEL RELEASE  bilateral    CHOLECYSTECTOMY  11/2016   COLONOSCOPY N/A 09/22/2018   Procedure: COLONOSCOPY;  Surgeon: Golda Claudis PENNER, MD;  Location: [facility];  Service: Endoscopy;  Laterality: N/A;  1:00   CORONARY ANGIOPLASTY WITH STENT PLACEMENT     to rt coronary atery (Dr. Degert-cardiologist)   CYSTOSCOPY WITH RETROGRADE PYELOGRAM, URETEROSCOPY AND STENT PLACEMENT Left 03/07/2018   Procedure: CYSTOSCOPY WITH RETROGRADE PYELOGRAM,  AND STENT PLACEMENT;  Surgeon: Alvaro Hummer, MD;  Location: [facility] OR[facility];  Service: Urology;  Laterality: Left;   ERCP N/A 11/24/2016   Procedure: ENDOSCOPIC RETROGRADE CHOLANGIOPANCREATOGRAPHY (ERCP);  Surgeon: Golda Claudis PENNER, MD;  Location: [facility];  Service: Endoscopy;  Laterality: N/A;   EXTRACORPOREAL SHOCK WAVE LITHOTRIPSY Left 03/17/2018   Procedure: LEFT EXTRACORPOREAL SHOCK WAVE LITHOTRIPSY (ESWL);  Surgeon: Watt Rush, MD;  Location: [facility];  Service: Urology;  Laterality: Left;   REMOVAL OF STONES N/A 11/24/2016   Procedure: REMOVAL OF STONES;  Surgeon: Golda Claudis PENNER, MD;  Location: [facility];  Service: Endoscopy;  Laterality: N/A;   SPHINCTEROTOMY N/A 11/24/2016   Procedure: SPHINCTEROTOMY;  Surgeon: Golda Claudis PENNER, MD;  Location: [facility];  Service: Endoscopy;  Laterality: N/A;   TISSUE AORTIC VALVE REPLACEMENT     2012     Medications Prior to Admission: Prior to Admission medications   Medication Sig Start Date End Date Taking? Authorizing Provider  acetaminophen  (TYLENOL ) 325 MG tablet Take 2 tablets (650 mg total) by mouth every 6 (six) hours as needed for mild pain (or Fever >/= 101). 07/17/21   Pearlean Manus, MD  amLODipine  (NORVASC ) 10 MG tablet TAKE 1 TABLET BY MOUTH DAILY 09/27/23   Alvan Dorn FALCON, MD  aspirin  EC 81 MG tablet Take 1 tablet (81 mg total) by mouth daily. Swallow whole. 06/01/22   Alvan Dorn FALCON, MD  atenolol  (TENORMIN ) 25 MG tablet Take 1 tablet (25 mg total) by mouth daily. 09/08/23   Dettinger, Fonda LABOR, MD  atorvastatin  (LIPITOR) 40 MG tablet Take 1 tablet (40 mg total) by mouth daily. 06/02/23   Dettinger, Fonda LABOR, MD  blood glucose meter kit and supplies KIT Dispense based on patient and insurance preference. Use up to four times daily as directed. (FOR ICD-9 250.00, 250.01). 07/29/20   Dettinger, Fonda LABOR, MD  cholecalciferol (VITAMIN D ) 1000 UNITS tablet Take 2,000 Units by mouth 2 (two) times daily.    [provider]  Continuous Glucose Receiver (FREESTYLE LIBRE 3 READER) DEVI 1 each by Does not apply route 4 (four) times daily. 11/23/22   Dettinger, Fonda LABOR, MD  Continuous Glucose Sensor (FREESTYLE LIBRE 3 PLUS SENSOR) MISC  Change sensor every 15 days. 06/02/23   Dettinger, Fonda LABOR, MD  diclofenac Sodium (VOLTAREN) 1 % GEL SMARTSIG:2 Gram(s) Topical 3 Times Daily PRN 07/13/22   [provider]  gabapentin (NEURONTIN) 100 MG capsule Take 100 mg by mouth at bedtime. 12/07/22   [provider]  glipiZIDE  (GLUCOTROL ) 5 MG tablet Take 2 tablets (10 mg total) by mouth 2 (two) times daily before a meal. 10/25/23   Dettinger, Fonda LABOR, MD  insulin  degludec (TRESIBA  FLEXTOUCH) 100 UNIT/ML FlexTouch Pen Inject 10 Units into the skin daily. Patient taking differently: Inject 14 Units into the skin daily. 07/17/21   Pearlean Manus, MD  Insulin  Pen Needle (NOVOFINE PEN NEEDLE) 32G X 6 MM MISC 1 Needle by Does not apply route 3 (three) times daily. 05/17/23   Dettinger, Fonda LABOR, MD  Insulin  Pen  Needle (TRUEPLUS 5-BEVEL PEN NEEDLES) 31G X 6 MM MISC UAD TID Dx E11.69 09/30/23   Dettinger, Fonda LABOR, MD  Lancets [facility] DELICA PLUS LANCET33G) MISC 4 (four) times daily. 07/29/20   [provider]  metFORMIN  (GLUCOPHAGE ) 1000 MG tablet Take 1 tablet (1,000 mg total) by mouth 2 (two) times daily with a meal. 09/08/23   Dettinger, Fonda LABOR, MD  [facility] ULTRA test strip Test BS 4 times daily Dx E11.69 02/19/23   Dettinger, Fonda LABOR, MD  polyethylene glycol (MIRALAX  / GLYCOLAX ) 17 g packet Take 17 g by mouth daily as needed.    [provider]  Probiotic Product (ALIGN PO) Take by mouth daily.    [provider]  temazepam  (RESTORIL ) 15 MG capsule TAKE ONE CAPSULE BY MOUTH AT BEDTIME AS NEEDED Patient taking differently: Take 15 mg by mouth at bedtime as needed for sleep. 02/05/22   Dettinger, Fonda LABOR, MD     Allergies:    Allergies  Allergen Reactions   Glyxambi  [Empagliflozin -Linagliptin ] Diarrhea and Itching    He was able to take Jardiance  alone with no side effects so it was the Tradjenta  part that most likely gave him the reaction   Hctz [Hydrochlorothiazide]     Nausea and headache     Sulfa Antibiotics Nausea And Vomiting   Sulfonamide Derivatives     Social History:   Social History   Socioeconomic History   Marital status: Married    Spouse name: Jerel   Number of children: 3   Years of education: Not on file   Highest education level: High school graduate  Occupational History   Occupation: Retired  Tobacco Use   Smoking status: Former    Current packs/day: 0.00    Average packs/day: 1.5 packs/day for 28.0 years (42.0 ttl pk-yrs)    Types: Cigarettes    Start date: 07/06/1974    Quit date: 07/06/2002    Years since quitting: 21.5    Passive exposure: Never   Smokeless tobacco: Never  Vaping Use   Vaping status: Never Used  Substance and Sexual Activity   Alcohol use: No    Alcohol/week: 0.0 standard drinks of alcohol   Drug use: No   Sexual activity: Not on file  Other Topics Concern   Not on file  Social History Narrative   Not on file   Social Drivers of Health   Financial Resource Strain: Low Risk  (12/06/2023)   Overall Financial Resource Strain (CARDIA)    Difficulty of Paying Living Expenses: Not hard at all  Food Insecurity: No Food Insecurity (12/06/2023)   Hunger Vital Sign    Worried About Running Out of Food in the Last Year: Never true    Ran Out of Food in the Last Year: Never true  Transportation Needs: No Transportation Needs (12/06/2023)   PRAPARE - Administrator, Civil Service (Medical): No    Lack of Transportation (Non-Medical): No  Physical Activity: Sufficiently Active (12/06/2023)   Exercise Vital Sign    Days of Exercise per Week: 5 days    Minutes of Exercise per Session: 60 min  Stress: No Stress Concern Present (12/06/2023)   [employer] of Occupational Health - Occupational Stress Questionnaire    Feeling of Stress : Not at all  Social Connections: Moderately Integrated (12/06/2023)   Social Connection and Isolation Panel    Frequency of Communication with Friends and Family: Twice a week    Frequency of  Social Gatherings with Friends  and Family: Twice a week    Attends Religious Services: More than 4 times per year    Active Member of Clubs or Organizations: No    Attends Banker Meetings: Never    Marital Status: Married  Catering manager Violence: Not At Risk (12/06/2023)   Humiliation, Afraid, Rape, and Kick questionnaire    Fear of Current or Ex-Partner: No    Emotionally Abused: No    Physically Abused: No    Sexually Abused: No     Family History:   The patient's family history includes Alzheimer's disease in his mother; Diabetes in his father and another family member; Hearing loss in his father and mother.    ROS:  Please see the history of present illness.  Denies any fevers chills nausea vomiting all other ROS reviewed and negative.     Physical Exam/Data: Vitals:   12/31/23 1617  BP: (!) 177/70  Pulse: (!) 54  Resp: 16  Temp: 98.5 F (36.9 C)  TempSrc: Oral  SpO2: 100%  Weight: 84.1 kg  Height: 5' 10 (1.778 m)   No intake or output data in the 24 hours ending 12/31/23 1622    12/31/2023    4:17 PM 12/09/2023    9:52 AM 12/06/2023   10:14 AM  Last 3 Weights  Weight (lbs) 185 lb 8 oz 194 lb 193 lb  Weight (kg) 84.142 kg 87.998 kg 87.544 kg     Body mass index is 26.62 kg/m.  General:  Well nourished, well developed, in no acute distress HEENT: Nasal fracture noted, forehead contusion noted Neck: no JVD Vascular: No carotid bruits; Distal pulses 2+ bilaterally   Cardiac:  normal S1, S2; RRR; no murmur  Lungs:  clear to auscultation bilaterally, no wheezing, rhonchi or rales  Abd: soft, nontender, no hepatomegaly  Ext: no edema Musculoskeletal:  No deformities, BUE and BLE strength normal and equal Skin: warm and dry  Neuro:  CNs 2-12 intact, no focal abnormalities noted Psych:  Normal affect   EKG: December 30, 2023 EKG from yesterday afternoon shows sinus bradycardia heart rate 55 bpm with normal PR interval normal QRS duration, poor R wave  progression, normal QT.  Most recent EKG in the system on February 12, 2023 shows normal sinus rhythm rate 69 with inferior infarct pattern PR interval of 180 ms QRS duration 76 ms QT 374 ms.  Relevant CV Studies: EKGs as listed above.  Heart rates have been mostly in the 50s on telemetry.  Laboratory Data:  Lab work at outside hospital from 12/30/2023 showed white count of 9.1 hemoglobin 12.6 hematocrit 37.3 platelet count of 208.  Troponin was 120 from 79 sodium 143 potassium 5.0 creatinine 1.62  Portable chest showed no acute cardiopulmonary findings  CT of head showed no acute intracranial abnormalities but a comminuted and mildly depressed fracture of the right nasal bone moderate leftward deviation of the nasal septum anteriorly.  High Sensitivity Troponin:  No results for input(s): TROPONINIHS in the last 720 hours.    ChemistryNo results for input(s): NA, K, CL, CO2, GLUCOSE, BUN, CREATININE, CALCIUM , MG, GFRNONAA, GFRAA, ANIONGAP in the last 168 hours.  No results for input(s): PROT, ALBUMIN, AST, ALT, ALKPHOS, BILITOT in the last 168 hours. Lipids No results for input(s): CHOL, TRIG, HDL, LABVLDL, LDLCALC, CHOLHDL in the last 168 hours. HematologyNo results for input(s): WBC, RBC, HGB, HCT, MCV, MCH, MCHC, RDW, PLT in the last 168 hours. Thyroid  No results for input(s): TSH, FREET4 in the last  168 hours. BNPNo results for input(s): BNP, PROBNP in the last 168 hours.  DDimer No results for input(s): DDIMER in the last 168 hours.  Radiology/Studies:  No results found.   Assessment and Plan:  68 year old with syncope  Syncope - Got up from toilet, walking down hallway, a few seconds later fell face forward, broke his nose, head contusion.  Does not recall feeling any specific prodrome.  He did have a similar experience after his aortic valve replacement in 2012.  EKG is unremarkable, telemetry to this  point unremarkable.  Could be vasodilatory.  With his prior cardiac surgery and serious facial injury, it does make sense to try to implant loop recorder.  We have discussed case with Dr. Mealor/Cath Lab team/EP team.  Risk and benefits of been discussed including infection, bleeding.  He is willing to proceed.  -No driving for 3 months. -Check echocardiogram.  Most recent echocardiogram unremarkable.  Mildly elevated creatinine - 1.62.  Recent family medicine visit also mildly elevated.  He remembers being told to drink more fluids.  Mildly elevated troponin flat - Not ACS.  This was secondary to fall.  Nasal fracture - [facility]NT was [facility] at [facility].  Outpatient follow-up.  From ER: 1603: I spoke to Dr. Luciano who recommended ice packs and ice chips to control bleeding. He will follow-up in the office   We will observe overnight.    Risk Assessment/Risk Scores:      Code Status: Full Code  Severity of Illness: The appropriate patient status for this patient is OBSERVATION. Observation status is judged to be reasonable and necessary in order to provide the required intensity of service to ensure the patient's safety. The patient's presenting symptoms, physical exam findings, and initial radiographic and laboratory data in the context of their medical condition is felt to place them at decreased risk for further clinical deterioration. Furthermore, it is anticipated that the patient will be medically stable for discharge from the hospital within 2 midnights of admission.   For questions or updates, please contact [facility] Please consult [email or web address] for contact info under     Signed, Oneil Parchment, MD  12/31/2023 4:22 PM

## 2024-01-01 ENCOUNTER — Observation Stay (HOSPITAL_BASED_OUTPATIENT_CLINIC_OR_DEPARTMENT_OTHER)

## 2024-01-01 DIAGNOSIS — N189 Chronic kidney disease, unspecified: Secondary | ICD-10-CM | POA: Diagnosis not present

## 2024-01-01 DIAGNOSIS — Z952 Presence of prosthetic heart valve: Secondary | ICD-10-CM | POA: Diagnosis not present

## 2024-01-01 DIAGNOSIS — I129 Hypertensive chronic kidney disease with stage 1 through stage 4 chronic kidney disease, or unspecified chronic kidney disease: Secondary | ICD-10-CM | POA: Diagnosis not present

## 2024-01-01 DIAGNOSIS — R55 Syncope and collapse: Secondary | ICD-10-CM | POA: Diagnosis not present

## 2024-01-01 DIAGNOSIS — I4891 Unspecified atrial fibrillation: Secondary | ICD-10-CM | POA: Diagnosis not present

## 2024-01-01 DIAGNOSIS — E785 Hyperlipidemia, unspecified: Secondary | ICD-10-CM | POA: Diagnosis not present

## 2024-01-01 DIAGNOSIS — E1122 Type 2 diabetes mellitus with diabetic chronic kidney disease: Secondary | ICD-10-CM | POA: Diagnosis not present

## 2024-01-01 DIAGNOSIS — R7989 Other specified abnormal findings of blood chemistry: Secondary | ICD-10-CM | POA: Diagnosis not present

## 2024-01-01 DIAGNOSIS — S022XXA Fracture of nasal bones, initial encounter for closed fracture: Secondary | ICD-10-CM | POA: Diagnosis not present

## 2024-01-01 DIAGNOSIS — Z79899 Other long term (current) drug therapy: Secondary | ICD-10-CM | POA: Diagnosis not present

## 2024-01-01 DIAGNOSIS — Z87891 Personal history of nicotine dependence: Secondary | ICD-10-CM | POA: Diagnosis not present

## 2024-01-01 DIAGNOSIS — Z8679 Personal history of other diseases of the circulatory system: Secondary | ICD-10-CM | POA: Diagnosis not present

## 2024-01-01 LAB — BASIC METABOLIC PANEL WITH GFR
Anion gap: 11 (ref 5–15)
BUN: 25 mg/dL — ABNORMAL HIGH (ref 8–23)
CO2: 23 mmol/L (ref 22–32)
Calcium: 9.4 mg/dL (ref 8.9–10.3)
Chloride: 107 mmol/L (ref 98–111)
Creatinine, Ser: 1.63 mg/dL — ABNORMAL HIGH (ref 0.61–1.24)
GFR, Estimated: 46 mL/min — ABNORMAL LOW (ref >60)
Glucose, Bld: 169 mg/dL — ABNORMAL HIGH (ref 70–99)
Potassium: 4.1 mmol/L (ref 3.5–5.1)
Sodium: 141 mmol/L (ref 135–145)

## 2024-01-01 LAB — ECHOCARDIOGRAM COMPLETE
AR max vel: 1.08 cm2
AV Area VTI: 1.18 cm2
AV Area mean vel: 1.08 cm2
AV Mean grad: 10 mmHg
AV Peak grad: 19.7 mmHg
Ao pk vel: 2.22 m/s
Area-P 1/2: 2.42 cm2
Height: 70 in
S' Lateral: 3.1 cm
Weight: 2947.2 [oz_av]

## 2024-01-01 LAB — CBC
HCT: 38.7 % — ABNORMAL LOW (ref 39.0–52.0)
Hemoglobin: 12.9 g/dL — ABNORMAL LOW (ref 13.0–17.0)
MCH: 32 pg (ref 26.0–34.0)
MCHC: 33.3 g/dL (ref 30.0–36.0)
MCV: 96 fL (ref 80.0–100.0)
Platelets: 196 10*3/uL (ref 150–400)
RBC: 4.03 MIL/uL — ABNORMAL LOW (ref 4.22–5.81)
RDW: 12.2 % (ref 11.5–15.5)
WBC: 8 10*3/uL (ref 4.0–10.5)
nRBC: 0 % (ref 0.0–0.2)

## 2024-01-01 NOTE — Progress Notes (Signed)
 Reviewed AVS, patient expressed understanding of medications, MD follow up reviewed.   Patient states all belongings brought to the hospital at time of admission are accounted for and packed to take home.

## 2024-01-01 NOTE — Progress Notes (Addendum)
 Rounding Note   Patient Name: [name] [name] Date of Encounter: 01/01/2024  [facility] Cardiologist: Alvan Carrier, MD   Subjective No complaints  Scheduled Meds:  amLODipine   10 mg Oral Daily   aspirin  EC  81 mg Oral Daily   atorvastatin   40 mg Oral Daily   diclofenac Sodium  2 g Topical QID   gabapentin  100 mg Oral QHS   heparin   5,000 Units Subcutaneous Q8H   Continuous Infusions:  PRN Meds: acetaminophen , polyethylene glycol, temazepam    Vital Signs  Vitals:   12/31/23 1810 12/31/23 2024 01/01/24 0014 01/01/24 0602  BP: (!) 156/65 (!) 164/75 (!) 143/74 (!) 152/77  Pulse: (!) 59 (!) 59 (!) 59 (!) 56  Resp: 18 17 15 16   Temp: 98.2 F (36.8 C) 98.1 F (36.7 C) 98.3 F (36.8 C) 98.5 F (36.9 C)  TempSrc: Oral Oral Oral Oral  SpO2: 100% 95% 97% 98%  Weight:    83.6 kg  Height:        Intake/Output Summary (Last 24 hours) at 01/01/2024 1013 Last data filed at 01/01/2024 9394 Gross per 24 hour  Intake 240 ml  Output 625 ml  Net -385 ml      01/01/2024    6:02 AM 12/31/2023    4:17 PM 12/09/2023    9:52 AM  Last 3 Weights  Weight (lbs) 184 lb 3.2 oz 185 lb 8 oz 194 lb  Weight (kg) 83.553 kg 84.142 kg 87.998 kg      Telemetry NSR - Personally Reviewed  ECG  N/a - Personally Reviewed  Physical Exam  GEN: No acute distress.   Neck: No JVD Cardiac: RRR, no murmurs, rubs, or gallops.  Respiratory: Clear to auscultation bilaterally. GI: Soft, nontender, non-distended  MS: No edema; No deformity. Neuro:  Nonfocal  Psych: Normal affect   Labs High Sensitivity Troponin:  No results for input(s): TROPONINIHS in the last 720 hours.   Chemistry Recent Labs  Lab 12/31/23 1822 01/01/24 0439  NA 139 141  K 4.0 4.1  CL 102 107  CO2 23 23  GLUCOSE 208* 169*  BUN 27* 25*  CREATININE 1.84* 1.63*  CALCIUM  9.4 9.4  GFRNONAA 39* 46*  ANIONGAP 14 11    Lipids No results for input(s): CHOL, TRIG, HDL, LABVLDL, LDLCALC, CHOLHDL  in the last 168 hours.  Hematology Recent Labs  Lab 12/31/23 1822 01/01/24 0439  WBC 9.7 8.0  RBC 4.20* 4.03*  HGB 13.6 12.9*  HCT 41.0 38.7*  MCV 97.6 96.0  MCH 32.4 32.0  MCHC 33.2 33.3  RDW 12.4 12.2  PLT 244 196   Thyroid  No results for input(s): TSH, FREET4 in the last 168 hours.  BNPNo results for input(s): BNP, PROBNP in the last 168 hours.  DDimer No results for input(s): DDIMER in the last 168 hours.   Radiology  EP PPM/ICD IMPLANT Result Date: 12/31/2023 SURGEON:  Eulas FORBES Furbish, MD   PREPROCEDURE DIAGNOSIS:  Syncope   POSTPROCEDURE DIAGNOSIS: Syncope    PROCEDURES:  1. Implantable loop recorder implantation   INTRODUCTION:  [name] [name] presents with a history of syncope The costs of loop recorder monitoring have been discussed with the patient.   DESCRIPTION OF PROCEDURE:  Informed written consent was obtained.  A timeout was performed. The patient required no sedation for the procedure today. The patients left chest was prepped and draped in the usual sterile fashion. The skin overlying the left parasternal region was infiltrated with lidocaine  for  local analgesia.  A 0.5-cm incision was made over the left parasternal region over the 3rd intercostal space.  A subcutaneous ILR pocket was fashioned using a combination of sharp and blunt dissection.  A Medtronic Reveal LINQ 2 (SN [id number]) implantable loop recorder was then placed into the pocket  R waves were very prominent and measured >0.53mV.  Steri- Strips and a sterile dressing were then applied.  There were no early apparent complications.   CONCLUSIONS:  1. Successful implantation of a implantable loop recorder for a history of Syncope  2. No early apparent complications. Eulas FORBES Furbish, MD Cardiac Electrophysiology      Patient Profile   [name] [name] is a 68 y.o. male  12/31/2023 history of prior remote stent, aortic valve replacement 2012 for the evaluation of syncope   Assessment & Plan    1.Syncope - initially presented to [facility], tranffered to [facility] - unclear etiology. With lack of prodrome some question if possibly cardiogenic - EKG, tele benign - loop recorder placed this admission - echo pending - check orthostatics -->orthostatics were abnormal, we discussed increased oral hydration. Admits to poor daily hydration.    2.Nasal fracture - ENT was consulted at [facility], outpatient f/u   3. CKD - Cr 1.5-1.8 at baseline  4. History of AVR - echo pending  5. Troponin elevation - likely demand ischemia from fall, has not had any cardiac symptoms. EKG has been benign - f/u echo   Possible discharge pending echo   For questions or updates, please contact [facility] Please consult [email or web address] for contact info under     Signed, Alvan Carrier, MD  01/01/2024, 10:13 AM

## 2024-01-01 NOTE — Discharge Summary (Signed)
 Discharge Summary   Patient ID: [name] [name] MRN: [id number]; DOB: 12-24-1955  Admit date: 12/31/2023 Discharge date: 01/01/2024  PCP:  Dettinger, Fonda LABOR, MD   [facility] Providers Cardiologist:  Alvan Carrier, MD   Discharge Diagnoses  Active Problems:   Syncope and collapse   Diagnostic Studies/Procedures  Echocardiogram 01/01/2024 1. Left ventricular ejection fraction, by estimation, is 60 to 65%. The  left ventricle has normal function. The left ventricle has no regional  wall motion abnormalities. Left ventricular diastolic parameters are  consistent with Grade I diastolic  dysfunction (impaired relaxation).   2. Right ventricular systolic function is normal. The right ventricular  size is normal. Tricuspid regurgitation signal is inadequate for assessing  PA pressure.   3. Left atrial size was mildly dilated.   4. The mitral valve is normal in structure. No evidence of mitral valve  regurgitation.   5. The aortic valve has been repaired/replaced. Aortic valve  regurgitation is not visualized. No aortic stenosis is present. There is a  25 mm Magna valve present in the aortic position. Aortic valve mean  gradient measures 10.0 mmHg. Aortic valve Vmax  measures 2.22 m/s. Aortic valve acceleration time measures 92 msec.   Comparison(s): No significant change from prior study. Prior images  reviewed side by side.   _____________   History of Present Illness   [name] [name] is a 68 y.o. male with status post bioprosthetic AVR 2012, ascending aortic aneurysm, hypertension, OSA refuses CPAP, hyperlipidemia Type 2 diabetes, pulmonary nodules, moderate bilateral carotid stenosis, isolated atrial fibrillation (noted in 2023, follow-up heart monitor no recurrences), CKD.  Patient admitted for episode of syncope.  Reportedly was getting up from his toilet approximately 10 AM after having a bowel movement and also routinely takes MiraLAX .  He was walking towards  the door in the hallway in has a lapse in memory but eventually fell face first into the door and sustained comminuted nasal fracture with large amounts of blood coming out from his mouth.  Wife was present at the time.  Reportedly out for approximately 1 minute.  Again has no recollection of these events, reports seeing some stars.  No prodrome.  He also reported previous episode at the time of his AVR several years ago.  ENT was consulted in the ED and they did not recommend anything besides outpatient follow-up.  Otherwise status post AVR with stable gradients and function on serial echocardiograms, most recent being in March 2025. He has been taking amlodipine  10 mg, atenolol  25 mg, atorvastatin  40 mg for hypertension/hyperlipidemia.  He also has type 2 diabetes on insulin .   He has not been taking Eliquis  5 mg, diltiazem  SR 90 mg every 12 hours, lisinopril  10 mg.    Hospital Course    Syncope After getting up from the toilet and walking down to the hallway he fell face first into the door sustaining nasal fracture/head contusion.  No prodrome documented.  Reports prior episode around time of AVR.  EKG not showing any significant given arrhythmias.  Echocardiogram with preserved biventricular function with no significant valvular disease.  Orthostatics were positive here and suspicious as being underlying etiology as he has had poor hydration, but still concerning for cardiogenic reasons. Given lack of prodrome we proceeded with loop recorder Continue to avoid secondary causes, encourage adequate fluid hydration and p.o. intake.   No driving for at least 3 months until otherwise specified by medical professional.  Directions per Dr. Jeffrie.  Nasal fracture ENT  was consulted at [facility], no need for acute interventions, follow-up outpatient.  History of AVR Noted remotely in 2012.  Echocardiogram this admission shows stable device function.  Mean gradient 10.  History of carotid artery  stenosis 03/2023 RICA 60-79%, LICA 40-59%.  He had moderate disease that seems to be stable off of serial vascular ultrasounds.  May consider repeating this outpatient Continue aspirin   Isolated atrial fibrillation Noted during January 2023 admission.  Follow-up heart monitor showing no recurrences.  Suspected to be related to severe dehydration/AKI previously.  Therefore has not been on anticoagulation for this.  Elevated troponin Suspected to be demand ischemia from fall.  Troponins in the low 50s to 100s.  Has no anginal symptoms and EKG nonischemic.  Echocardiogram with no regional wall motion abnormalities.  CKD Mildly elevated creatinine from baseline.  May be contributory.  Continue adequate fluid hydration.  Hypertension Needs better control.  Blood pressure has been between 150-170.  He defined a reasonable balance as he has been orthostatic being here.  Keep blood pressure log, take this twice a day for the next 2 weeks in bring to upcoming appointment. Continue with amlodipine  10 mg daily Of note he was not taking multiple medications prior to admission. Stopped his atenolol  25 mg given bradycardia and orthostatic  Hyperlipidemia Continue atorvastatin  40 mg.  LDL 3 weeks ago was 48.  Patient seen and examined by Dr. Alvan and deemed stable for discharge.  Linq recorder has been placed.  Follow-up has been arranged.   _____________  Discharge Vitals Blood pressure (!) 152/77, pulse (!) 56, temperature 98.5 F (36.9 C), temperature source Oral, resp. rate 16, height 5' 10 (1.778 m), weight 83.6 kg, SpO2 98%.  Filed Weights   12/31/23 1617 01/01/24 0602  Weight: 84.1 kg 83.6 kg    Labs & Radiologic Studies  CBC Recent Labs    12/31/23 1822 01/01/24 0439  WBC 9.7 8.0  HGB 13.6 12.9*  HCT 41.0 38.7*  MCV 97.6 96.0  PLT 244 196   Basic Metabolic Panel Recent Labs    93/72/74 1822 01/01/24 0439  NA 139 141  K 4.0 4.1  CL 102 107  CO2 23 23  GLUCOSE 208* 169*   BUN 27* 25*  CREATININE 1.84* 1.63*  CALCIUM  9.4 9.4   Liver Function Tests No results for input(s): AST, ALT, ALKPHOS, BILITOT, PROT, ALBUMIN in the last 72 hours. No results for input(s): LIPASE, AMYLASE in the last 72 hours. High Sensitivity Troponin:   No results for input(s): TROPONINIHS in the last 720 hours.  No results for input(s): TRNPT in the last 720 hours.  BNP Invalid input(s): POCBNP No results for input(s): PROBNP in the last 72 hours.  No results for input(s): BNP in the last 72 hours.  D-Dimer No results for input(s): DDIMER in the last 72 hours. Hemoglobin A1C No results for input(s): HGBA1C in the last 72 hours. Fasting Lipid Panel No results for input(s): CHOL, HDL, LDLCALC, TRIG, CHOLHDL, LDLDIRECT in the last 72 hours. No results found for: LIPOA  Thyroid  Function Tests No results for input(s): TSH, T4TOTAL, T3FREE, THYROIDAB in the last 72 hours.  Invalid input(s): FREET3 _____________  ECHOCARDIOGRAM COMPLETE Result Date: 01/01/2024    ECHOCARDIOGRAM REPORT   Patient Name:   HENDRICKS SCHWANDT Date of Exam: 01/01/2024 Medical Rec #:  [id number]     Height:       70.0 in Accession #:    [id number]    Weight:  184.2 lb Date of Birth:  [birth year]    BSA:          2.016 m Patient Age:    67 years      BP:           152/77 mmHg Patient Gender: M             HR:           62 bpm. Exam Location:  Inpatient Procedure: 2D Echo (Both Spectral and Color Flow Doppler were utilized during            procedure). Indications:    syncope  History:        Patient has prior history of Echocardiogram examinations, most                 recent 09/20/2023. CAD, Arrythmias:Atrial Fibrillation; Risk                 Factors:Diabetes, Hypertension, Dyslipidemia and Sleep Apnea.                 Aortic Valve: 25 mm Magna valve is present in the aortic                 position.  Sonographer:    Tinnie Barefoot RDCS Referring Phys: [id number]  Lennis Korb L Tifani Dack IMPRESSIONS  1. Left ventricular ejection fraction, by estimation, is 60 to 65%. The left ventricle has normal function. The left ventricle has no regional wall motion abnormalities. Left ventricular diastolic parameters are consistent with Grade I diastolic dysfunction (impaired relaxation).  2. Right ventricular systolic function is normal. The right ventricular size is normal. Tricuspid regurgitation signal is inadequate for assessing PA pressure.  3. Left atrial size was mildly dilated.  4. The mitral valve is normal in structure. No evidence of mitral valve regurgitation.  5. The aortic valve has been repaired/replaced. Aortic valve regurgitation is not visualized. No aortic stenosis is present. There is a 25 mm Magna valve present in the aortic position. Aortic valve mean gradient measures 10.0 mmHg. Aortic valve Vmax measures 2.22 m/s. Aortic valve acceleration time measures 92 msec. Comparison(s): No significant change from prior study. Prior images reviewed side by side. FINDINGS  Left Ventricle: Left ventricular ejection fraction, by estimation, is 60 to 65%. The left ventricle has normal function. The left ventricle has no regional wall motion abnormalities. The left ventricular internal cavity size was normal in size. There is  no left ventricular hypertrophy. Left ventricular diastolic parameters are consistent with Grade I diastolic dysfunction (impaired relaxation). Right Ventricle: The right ventricular size is normal. Right vetricular wall thickness was not well visualized. Right ventricular systolic function is normal. Tricuspid regurgitation signal is inadequate for assessing PA pressure. Left Atrium: Left atrial size was mildly dilated. Right Atrium: Right atrial size was normal in size. Pericardium: There is no evidence of pericardial effusion. Mitral Valve: The mitral valve is normal in structure. No evidence of mitral valve regurgitation. Tricuspid Valve: The tricuspid valve is  normal in structure. Tricuspid valve regurgitation is not demonstrated. Aortic Valve: The aortic valve has been repaired/replaced. Aortic valve regurgitation is not visualized. No aortic stenosis is present. Aortic valve mean gradient measures 10.0 mmHg. Aortic valve peak gradient measures 19.7 mmHg. Aortic valve area, by VTI measures 1.18 cm. There is a 25 mm Magna valve present in the aortic position. Pulmonic Valve: The pulmonic valve was not well visualized. Pulmonic valve regurgitation is not visualized. Aorta: The aortic root and ascending aorta  are structurally normal, with no evidence of dilitation. IAS/Shunts: No atrial level shunt detected by color flow Doppler.  LEFT VENTRICLE PLAX 2D LVIDd:         4.80 cm   Diastology LVIDs:         3.10 cm   LV e' medial:    5.22 cm/s LV PW:         1.00 cm   LV E/e' medial:  15.0 LV IVS:        1.10 cm   LV e' lateral:   9.46 cm/s LVOT diam:     1.80 cm   LV E/e' lateral: 8.3 LV SV:         59 LV SV Index:   29 LVOT Area:     2.54 cm  RIGHT VENTRICLE            IVC RV Basal diam:  2.70 cm    IVC diam: 1.50 cm RV S prime:     9.79 cm/s TAPSE (M-mode): 1.1 cm LEFT ATRIUM             Index        RIGHT ATRIUM           Index LA diam:        4.30 cm 2.13 cm/m   RA Area:     15.60 cm LA Vol (A2C):   47.1 ml 23.37 ml/m  RA Volume:   37.80 ml  18.75 ml/m LA Vol (A4C):   39.7 ml 19.70 ml/m LA Biplane Vol: 48.0 ml 23.81 ml/m  AORTIC VALVE AV Area (Vmax):    1.08 cm AV Area (Vmean):   1.08 cm AV Area (VTI):     1.18 cm AV Vmax:           222.00 cm/s AV Vmean:          147.000 cm/s AV VTI:            0.499 m AV Peak Grad:      19.7 mmHg AV Mean Grad:      10.0 mmHg LVOT Vmax:         94.35 cm/s LVOT Vmean:        62.100 cm/s LVOT VTI:          0.230 m LVOT/AV VTI ratio: 0.46  AORTA Ao Root diam: 3.60 cm Ao Asc diam:  3.50 cm MITRAL VALVE MV Area (PHT): 2.42 cm     SHUNTS MV Decel Time: 313 msec     Systemic VTI:  0.23 m MV E velocity: 78.10 cm/s   Systemic Diam: 1.80  cm MV A velocity: 109.00 cm/s MV E/A ratio:  0.72 Mihai Croitoru MD Electronically signed by Jerel Balding MD Signature Date/Time: 01/01/2024/2:03:57 PM    Final    EP PPM/ICD IMPLANT Result Date: 12/31/2023 SURGEON:  Eulas FORBES Furbish, MD   PREPROCEDURE DIAGNOSIS:  Syncope   POSTPROCEDURE DIAGNOSIS: Syncope    PROCEDURES:  1. Implantable loop recorder implantation   INTRODUCTION:  FULLER MAKIN presents with a history of syncope The costs of loop recorder monitoring have been discussed with the patient.   DESCRIPTION OF PROCEDURE:  Informed written consent was obtained.  A timeout was performed. The patient required no sedation for the procedure today. The patients left chest was prepped and draped in the usual sterile fashion. The skin overlying the left parasternal region was infiltrated with lidocaine  for local analgesia.  A 0.5-cm incision was made over the left parasternal  region over the 3rd intercostal space.  A subcutaneous ILR pocket was fashioned using a combination of sharp and blunt dissection.  A Medtronic Reveal LINQ 2 (SN [id number]) implantable loop recorder was then placed into the pocket  R waves were very prominent and measured >0.24mV.  Steri- Strips and a sterile dressing were then applied.  There were no early apparent complications.   CONCLUSIONS:  1. Successful implantation of a implantable loop recorder for a history of Syncope  2. No early apparent complications. Eulas FORBES Furbish, MD Cardiac Electrophysiology     Disposition Pt is being discharged home today in good condition.  Follow-up Plans & Appointments    Discharge Medications Allergies as of 01/01/2024       Reactions   Empagliflozin  Diarrhea   Hydrochlorothiazide Nausea Only, Other (See Comments)   Headaches, too   Sulfa Antibiotics Nausea And Vomiting   Glyxambi  [empagliflozin -linagliptin ] Diarrhea, Itching, Other (See Comments)   He was able to take Jardiance  alone with no side effects so it was the Tradjenta   part that most likely gave him the reaction        Medication List     STOP taking these medications    atenolol  25 MG tablet Commonly known as: TENORMIN    temazepam  15 MG capsule Commonly known as: RESTORIL        TAKE these medications    acetaminophen  325 MG tablet Commonly known as: TYLENOL  Take 2 tablets (650 mg total) by mouth every 6 (six) hours as needed for mild pain (or Fever >/= 101). What changed: reasons to take this   ALIGN PO Take 1 capsule by mouth daily.   amLODipine  10 MG tablet Commonly known as: NORVASC  TAKE 1 TABLET BY MOUTH DAILY   aspirin  EC 81 MG tablet Take 1 tablet (81 mg total) by mouth daily. Swallow whole. What changed: when to take this   atorvastatin  40 MG tablet Commonly known as: LIPITOR Take 1 tablet (40 mg total) by mouth daily.   blood glucose meter kit and supplies Kit Dispense based on patient and insurance preference. Use up to four times daily as directed. (FOR ICD-9 250.00, 250.01).   diclofenac Sodium 1 % Gel Commonly known as: VOLTAREN Apply 2 g topically 3 (three) times daily as needed (for pain).   FreeStyle Libre 3 Plus Sensor Misc Change sensor every 15 days. What changed:  how much to take how to take this when to take this additional instructions   FreeStyle Libre 3 Reader [facility] 1 each by Does not apply route 4 (four) times daily.   glipiZIDE  5 MG tablet Commonly known as: GLUCOTROL  Take 2 tablets (10 mg total) by mouth 2 (two) times daily before a meal.   metFORMIN  1000 MG tablet Commonly known as: GLUCOPHAGE  Take 1 tablet (1,000 mg total) by mouth 2 (two) times daily with a meal.   Novofine Pen Needle 32G X 6 MM Misc Generic drug: Insulin  Pen Needle 1 Needle by Does not apply route 3 (three) times daily.   TRUEplus 5-Bevel Pen Needles 31G X 6 MM Misc Generic drug: Insulin  Pen Needle UAD TID Dx E11.69   OneTouch Delica Plus Lancet33G Misc 4 (four) times daily.   OneTouch Ultra test  strip Generic drug: glucose blood Test BS 4 times daily Dx E11.69   polyethylene glycol 17 g packet Commonly known as: MIRALAX  / GLYCOLAX  Take 17 g by mouth daily.   Tresiba  FlexTouch 100 UNIT/ML FlexTouch Pen Generic drug: insulin  degludec Inject 10 Units into  the skin daily. What changed:  how much to take when to take this   Vitamin D3 50 MCG (2000 UT) Tabs Take 2,000 Units by mouth 2 (two) times daily.         Outstanding Labs/Studies   Duration of Discharge Encounter: APP Time: 30 minutes   Signed, Thom LITTIE Sluder, PA-C 01/01/2024, 2:48 PM

## 2024-01-01 NOTE — Progress Notes (Signed)
  Echocardiogram 2D Echocardiogram has been performed.  Kinga Cassar 01/01/2024, 1:55 PM

## 2024-01-01 NOTE — Care Management Obs Status (Signed)
 MEDICARE OBSERVATION STATUS NOTIFICATION   Patient Details  Name: VANDEN FAWAZ MRN: [id number] Date of Birth: 1956-06-23   Medicare Observation Status Notification Given:  Yes    Ace Bergfeld G., RN 01/01/2024, 9:18 AM

## 2024-01-02 ENCOUNTER — Encounter (HOSPITAL_COMMUNITY): Payer: Self-pay | Admitting: Cardiovascular Disease

## 2024-01-13 ENCOUNTER — Ambulatory Visit: Admitting: Family Medicine

## 2024-01-13 ENCOUNTER — Encounter: Payer: Self-pay | Admitting: Family Medicine

## 2024-01-13 VITALS — BP 158/76 | HR 94 | Ht 70.0 in | Wt 187.0 lb

## 2024-01-13 DIAGNOSIS — S022XXD Fracture of nasal bones, subsequent encounter for fracture with routine healing: Secondary | ICD-10-CM | POA: Diagnosis not present

## 2024-01-13 DIAGNOSIS — S0083XD Contusion of other part of head, subsequent encounter: Secondary | ICD-10-CM

## 2024-01-13 DIAGNOSIS — R55 Syncope and collapse: Secondary | ICD-10-CM

## 2024-01-13 NOTE — Progress Notes (Signed)
 BP (!) 158/76   Pulse 94   Ht 5' 10 (1.778 m)   Wt 187 lb (84.8 kg)   SpO2 97%   BMI 26.83 kg/m    Subjective:   Patient ID: [name] [name], male    DOB: December 23, 1955, 68 y.o.   MRN: [id number]  HPI: [name] [name] is a 68 y.o. male presenting on 01/13/2024 for Er follow up (Syncope- wants clearance to drive. Pt was told in hosp that he could not drive for 09i)   HPI ER/hospital follow-up Patient was admitted on 12/30/2023 and discharged on 01/01/2024.  During the hospital admission he was evaluated for his syncopal episode and was given a loop recorder and evaluated by cardiology.  He had a recent aortic valve replacement and they were suspicious that this was a cardiac in nature syncopal episode.  Patient is currently wearing a loop recorder but cardiology is monitoring.  He says since he left the hospital he has not had any more syncopal episodes.  He did have a very large hematoma just above his left eye that he says is significantly gone down in size.  He says the episode happened right after he was having a bowel movement and then got up and will went to walk in the hall and then passed out and hit face first on the ground.  He was unconscious for a short period of time after that and he cannot recall the incident or anything happening that made him feel like the incident was going to happen.  Relevant past medical, surgical, family and social history reviewed and updated as indicated. Interim medical history since our last visit reviewed. Allergies and medications reviewed and updated.  Review of Systems  Constitutional:  Negative for chills and fever.  Eyes:  Negative for visual disturbance.  Respiratory:  Negative for shortness of breath and wheezing.   Cardiovascular:  Negative for chest pain and leg swelling.  Musculoskeletal:  Negative for back pain and gait problem.  Skin:  Negative for rash and wound.  Neurological:  Positive for light-headedness. Negative for dizziness.   All other systems reviewed and are negative.   Per HPI unless specifically indicated above   Allergies as of 01/13/2024       Reactions   Empagliflozin  Diarrhea   Hydrochlorothiazide Nausea Only, Other (See Comments)   Headaches, too   Sulfa Antibiotics Nausea And Vomiting   Glyxambi  [empagliflozin -linagliptin ] Diarrhea, Itching, Other (See Comments)   He was able to take Jardiance  alone with no side effects so it was the Tradjenta  part that most likely gave him the reaction        Medication List        Accurate as of January 13, 2024 11:27 AM. If you have any questions, ask your nurse or doctor.          acetaminophen  325 MG tablet Commonly known as: TYLENOL  Take 2 tablets (650 mg total) by mouth every 6 (six) hours as needed for mild pain (or Fever >/= 101). What changed: reasons to take this   ALIGN PO Take 1 capsule by mouth daily.   amLODipine  10 MG tablet Commonly known as: NORVASC  TAKE 1 TABLET BY MOUTH DAILY   aspirin  EC 81 MG tablet Take 1 tablet (81 mg total) by mouth daily. Swallow whole. What changed: when to take this   atorvastatin  40 MG tablet Commonly known as: LIPITOR Take 1 tablet (40 mg total) by mouth daily.   blood glucose meter kit  and supplies Kit Dispense based on patient and insurance preference. Use up to four times daily as directed. (FOR ICD-9 250.00, 250.01).   diclofenac  Sodium 1 % Gel Commonly known as: VOLTAREN  Apply 2 g topically 3 (three) times daily as needed (for pain).   FreeStyle Libre 3 Plus Sensor Misc Change sensor every 15 days. What changed:  how much to take how to take this when to take this additional instructions   FreeStyle Libre 3 [facility] 1 each by Does not apply route 4 (four) times daily.   glipiZIDE  5 MG tablet Commonly known as: GLUCOTROL  Take 2 tablets (10 mg total) by mouth 2 (two) times daily before a meal.   metFORMIN  1000 MG tablet Commonly known as: GLUCOPHAGE  Take 1 tablet (1,000 mg  total) by mouth 2 (two) times daily with a meal.   Novofine Pen Needle 32G X 6 MM Misc Generic drug: Insulin  Pen Needle 1 Needle by Does not apply route 3 (three) times daily.   TRUEplus 5-Bevel Pen Needles 31G X 6 MM Misc Generic drug: Insulin  Pen Needle UAD TID Dx E11.69   OneTouch Delica Plus Lancet33G Misc 4 (four) times daily.   OneTouch Ultra test strip Generic drug: glucose blood Test BS 4 times daily Dx E11.69   polyethylene glycol 17 g packet Commonly known as: MIRALAX  / GLYCOLAX  Take 17 g by mouth daily.   Tresiba  FlexTouch 100 UNIT/ML FlexTouch Pen Generic drug: insulin  degludec Inject 10 Units into the skin daily. What changed:  how much to take when to take this   Vitamin D3 50 MCG (2000 UT) Tabs Take 2,000 Units by mouth 2 (two) times daily.         Objective:   BP (!) 158/76   Pulse 94   Ht 5' 10 (1.778 m)   Wt 187 lb (84.8 kg)   SpO2 97%   BMI 26.83 kg/m   Wt Readings from Last 3 Encounters:  01/13/24 187 lb (84.8 kg)  01/01/24 184 lb 3.2 oz (83.6 kg)  12/09/23 194 lb (88 kg)    Physical Exam Vitals and nursing note reviewed.  Constitutional:      General: He is not in acute distress.    Appearance: He is well-developed. He is not diaphoretic.  HENT:     Head:   Eyes:     General: No scleral icterus.    Conjunctiva/sclera: Conjunctivae normal.  Neck:     Thyroid : No thyromegaly.  Cardiovascular:     Rate and Rhythm: Normal rate and regular rhythm.     Heart sounds: Normal heart sounds. No murmur heard. Pulmonary:     Effort: Pulmonary effort is normal. No respiratory distress.     Breath sounds: Normal breath sounds. No wheezing.  Musculoskeletal:        General: Normal range of motion.     Cervical back: Neck supple.  Lymphadenopathy:     Cervical: No cervical adenopathy.  Skin:    General: Skin is warm and dry.     Findings: No rash.  Neurological:     Mental Status: He is alert and oriented to person, place, and time.      Coordination: Coordination normal.  Psychiatric:        Behavior: Behavior normal.       Assessment & Plan:   Problem List Items Addressed This Visit       Other   Syncope and collapse - Primary   Relevant Orders   CBC with Differential/Platelet  CMP14+EGFR   Other Visit Diagnoses       Facial hematoma, subsequent encounter       Relevant Orders   CBC with Differential/Platelet   CMP14+EGFR     Closed fracture of nasal bone with routine healing, subsequent encounter       Relevant Orders   CBC with Differential/Platelet   CMP14+EGFR     Will recheck blood counts today, continue follow-up with cardiology based on loop recorder and they will have to do clearance for him to drive again.  Likely vasovagal syndrome but it looks like they are making sure there was no other types of arrhythmia that could have caused this issue.  Follow up plan: Return if symptoms worsen or fail to improve.  Counseling provided for all of the vaccine components Orders Placed This Encounter  Procedures   CBC with Differential/Platelet   CMP14+EGFR    Fonda Levins, MD [facility] 01/13/2024, 11:27 AM

## 2024-01-14 LAB — CBC WITH DIFFERENTIAL/PLATELET
Basophils Absolute: 0 x10E3/uL (ref 0.0–0.2)
Basos: 1 %
EOS (ABSOLUTE): 0.2 x10E3/uL (ref 0.0–0.4)
Eos: 2 %
Hematocrit: 40.2 % (ref 37.5–51.0)
Hemoglobin: 13.1 g/dL (ref 13.0–17.7)
Immature Grans (Abs): 0 x10E3/uL (ref 0.0–0.1)
Immature Granulocytes: 0 %
Lymphocytes Absolute: 1.3 x10E3/uL (ref 0.7–3.1)
Lymphs: 15 %
MCH: 31.9 pg (ref 26.6–33.0)
MCHC: 32.6 g/dL (ref 31.5–35.7)
MCV: 98 fL — ABNORMAL HIGH (ref 79–97)
Monocytes Absolute: 0.7 x10E3/uL (ref 0.1–0.9)
Monocytes: 8 %
Neutrophils Absolute: 6.6 x10E3/uL (ref 1.4–7.0)
Neutrophils: 74 %
Platelets: 259 x10E3/uL (ref 150–450)
RBC: 4.11 x10E6/uL — ABNORMAL LOW (ref 4.14–5.80)
RDW: 12.3 % (ref 11.6–15.4)
WBC: 8.8 x10E3/uL (ref 3.4–10.8)

## 2024-01-14 LAB — CMP14+EGFR
ALT: 31 IU/L (ref 0–44)
AST: 26 IU/L (ref 0–40)
Albumin: 4.8 g/dL (ref 3.9–4.9)
Alkaline Phosphatase: 89 IU/L (ref 44–121)
BUN/Creatinine Ratio: 17 (ref 10–24)
BUN: 28 mg/dL — ABNORMAL HIGH (ref 8–27)
Bilirubin Total: 0.4 mg/dL (ref 0.0–1.2)
CO2: 18 mmol/L — ABNORMAL LOW (ref 20–29)
Calcium: 10 mg/dL (ref 8.6–10.2)
Chloride: 103 mmol/L (ref 96–106)
Creatinine, Ser: 1.68 mg/dL — ABNORMAL HIGH (ref 0.76–1.27)
Globulin, Total: 2.6 g/dL (ref 1.5–4.5)
Glucose: 135 mg/dL — ABNORMAL HIGH (ref 70–99)
Potassium: 4.8 mmol/L (ref 3.5–5.2)
Sodium: 141 mmol/L (ref 134–144)
Total Protein: 7.4 g/dL (ref 6.0–8.5)
eGFR: 44 mL/min/1.73 — ABNORMAL LOW (ref >59)

## 2024-01-18 ENCOUNTER — Encounter: Payer: Self-pay | Admitting: Cardiology

## 2024-01-20 ENCOUNTER — Other Ambulatory Visit (HOSPITAL_COMMUNITY): Payer: Self-pay

## 2024-01-20 ENCOUNTER — Ambulatory Visit: Payer: Self-pay | Admitting: Family Medicine

## 2024-01-21 ENCOUNTER — Ambulatory Visit: Attending: Nurse Practitioner | Admitting: Nurse Practitioner

## 2024-01-21 ENCOUNTER — Encounter: Payer: Self-pay | Admitting: Nurse Practitioner

## 2024-01-21 VITALS — BP 137/62 | HR 81 | Ht 70.0 in | Wt 191.2 lb

## 2024-01-21 DIAGNOSIS — N1832 Chronic kidney disease, stage 3b: Secondary | ICD-10-CM

## 2024-01-21 DIAGNOSIS — E785 Hyperlipidemia, unspecified: Secondary | ICD-10-CM

## 2024-01-21 DIAGNOSIS — I6523 Occlusion and stenosis of bilateral carotid arteries: Secondary | ICD-10-CM

## 2024-01-21 DIAGNOSIS — I4891 Unspecified atrial fibrillation: Secondary | ICD-10-CM

## 2024-01-21 DIAGNOSIS — Z952 Presence of prosthetic heart valve: Secondary | ICD-10-CM

## 2024-01-21 DIAGNOSIS — Z87898 Personal history of other specified conditions: Secondary | ICD-10-CM

## 2024-01-21 DIAGNOSIS — I10 Essential (primary) hypertension: Secondary | ICD-10-CM | POA: Diagnosis not present

## 2024-01-21 MED ORDER — TRESIBA FLEXTOUCH 100 UNIT/ML ~~LOC~~ SOPN: 16.0000 [IU] | PEN_INJECTOR | Freq: Every day | SUBCUTANEOUS | Status: AC

## 2024-01-21 NOTE — Progress Notes (Signed)
 Cardiology Office Note   Date:  01/21/2024 ID:  Fredrico, Beedle 1956/06/10, MRN [id number] PCP: Dettinger, Fonda LABOR, MD  [facility] Providers Cardiologist:  Alvan Carrier, MD     History of Present Illness [name] [name] is a 68 y.o. male with a PMH of bicuspid aortic valve, s/p bioprosthetic AVR in 2012, hyperlipidemia, type 2 diabetes, isolated episode of A-fib in 2023, carotid artery stenosis, OSA, hypertension, and CKD stage III, who presents today for hospital follow-up.  Followed by [facility].  Last seen by Dr. Carrier Alvan September 01, 2023.  Patient reported some recent DOE at the time, echocardiogram was arranged.  He visited in ED at [facility] for episode of syncope.  Was transferred to [facility] for evaluation.  Episode happened after having a bowel movement, was walking towards the door in the hallway and eventually fell face first into the door and sustained comminuted nasal fracture with large amounts of blood projecting from his mouth.  Reportedly lost consciousness for 1 minute.  No prodrome.  ENT was consulted, recommended outpatient follow-up.  Orthostatics were positive in the hospital.  Loop recorder was implanted.  Troponin in the hospital was elevated and was suspected to be due to demand ischemia from fall.  Patient denied any chest pain.  Atenolol  was stopped given his bradycardia and orthostatic symptoms.  Today he presents for hospital follow-up.  He states he is overall doing well, however notices his BP's are more elevated recently after his medication was changed while in the hospital. Denies any syncopal recurrences. Denies any chest pain, shortness of breath, palpitations, syncope, presyncope, dizziness, orthopnea, PND, swelling or significant weight changes, acute bleeding, or claudication.  ROS: Negative. See HPI.  SH: Goes to [employer].   Studies Reviewed  Echo 12/2023:  1. Left ventricular ejection fraction, by estimation, is 60  to 65%. The  left ventricle has normal function. The left ventricle has no regional  wall motion abnormalities. Left ventricular diastolic parameters are  consistent with Grade I diastolic  dysfunction (impaired relaxation).   2. Right ventricular systolic function is normal. The right ventricular  size is normal. Tricuspid regurgitation signal is inadequate for assessing  PA pressure.   3. Left atrial size was mildly dilated.   4. The mitral valve is normal in structure. No evidence of mitral valve  regurgitation.   5. The aortic valve has been repaired/replaced. Aortic valve  regurgitation is not visualized. No aortic stenosis is present. There is a  25 mm Magna valve present in the aortic position. Aortic valve mean  gradient measures 10.0 mmHg. Aortic valve Vmax  measures 2.22 m/s. Aortic valve acceleration time measures 92 msec.   Comparison(s): No significant change from prior study. Prior images  reviewed side by side.  Carotid duplex 09/2023:  Summary:  Right Carotid: Velocities in the right ICA are consistent with a 40-59% stenosis. Unable to obtain higher velocities as seen on  previous exam.   Left Carotid: Velocities in the left ICA are consistent with a 1-39%  stenosis. Unable to obtain higher velocities as seen on previous exam.   Vertebrals:  Bilateral vertebral arteries demonstrate antegrade flow.  Subclavians: Normal flow hemodynamics were seen in bilateral subclavian arteries.   *See table(s) above for measurements and observations.    Cardiac monitor 05/2022:    30 day monitor   Min HR 51, Max HR 107, Avg HR 70   No symptoms reported   Telemetry tracings show normal sinus  rhythm, no significant arrhythmias  Physical Exam VS:  BP 137/62   Pulse 81   Ht 5' 10 (1.778 m)   Wt 191 lb 3.2 oz (86.7 kg)   SpO2 96%   BMI 27.43 kg/m        Wt Readings from Last 3 Encounters:  01/21/24 191 lb 3.2 oz (86.7 kg)  01/13/24 187 lb (84.8 kg)  01/01/24 184 lb 3.2  oz (83.6 kg)    GEN: Well nourished, well developed in no acute distress, minimal bruising across top of nose, small focal area of swelling along medial side of left eyebrow.  NECK: No JVD; No carotid bruits CARDIAC: S1/S2, RRR, no murmurs, rubs, gallops RESPIRATORY:  Clear to auscultation without rales, wheezing or rhonchi  ABDOMEN: Soft, non-tender, non-distended EXTREMITIES:  No edema; No deformity   ASSESSMENT AND PLAN  Bicuspid aortic valve, s/p AVR Echo 12/2023 revealed normal AVR function. No significant change from prior study. No changes to medication regimen.   Hx of syncope Etiology possible orthostatic related, hx of positive orthostatics during most recent hospitalization. He is s/p ILR d/t lack of prodrome. Did discuss conservative measures. Denies any recurrent episodes. Did reiterate Dr. Jeffrie recs regarding to avoid driving for at least 3 months. Continue to follow with PCP.    HTN BP on recheck stable. SBP goal 140's - 150's, okay with these pressures d/t past hx of syncope and hx of orthostasis. Discussed to monitor BP at home at least 2 hours after medications and sitting for 5-10 minutes. No medication changes at this time. Heart healthy diet and regular cardiovascular exercise encouraged.  Continue to follow with PCP. Will bring back in for nurse visit in 3 weeks for a BP check.   HLD LDL 48 12/2023. Continue atorvastatin . Heart healthy diet and regular cardiovascular exercise encouraged.   Carotid artery stenosis Carotid duplex 09/2023 revealed 40 to 59% stenosis along right ICA, 1 to 39% stenosis along the left ICA.  Closely followed by [facility].  Continue follow-up [facility]. Heart healthy diet and regular cardiovascular exercise encouraged.   Isolated episode of A-fib This was in the setting of just having COVID pneumonia, dehydrated with AKI. Newly dx 07/2021 admission.  Wore a 30-day monitor in August 2023 that was negative for A-fib.  No medication changes at this time.  Heart healthy diet and regular cardiovascular exercise encouraged.   CKD stage 3b  Most recent labs show stable kidney function.  Avoid nephrotoxic agents.  No medication changes at this time.  Continue follow-up with PCP.   Dispo: Follow-up with MD/APP in 6-8 weeks or sooner if anything changes.   Signed, Almarie Crate, NP

## 2024-01-21 NOTE — Patient Instructions (Addendum)
 Medication Instructions:   Continue all current medications.   Labwork:  none  Testing/Procedures:  none  Follow-Up:  6 - 8 weeks   Any Other Special Instructions Will Be Listed Below (If Applicable).  Please keep BP log x 3 weeks & bring to nurse visit for provider review.  Please check 2 hours after morning medications and sitting 5-10 minutes prior.    If you need a refill on your cardiac medications before your next appointment, please call your pharmacy.

## 2024-01-27 NOTE — Telephone Encounter (Signed)
 Has not been a month yet since his episode and the loop recorder has not yet been checked for follow up data, typically checked once a month. I think its too early at this date to clear driving, reasess at his [id number]/7972 appt.   JINNY Ross MD

## 2024-02-02 ENCOUNTER — Ambulatory Visit

## 2024-02-02 DIAGNOSIS — Z87898 Personal history of other specified conditions: Secondary | ICD-10-CM

## 2024-02-03 LAB — CUP PACEART REMOTE DEVICE CHECK
Date Time Interrogation Session: 20250730134132
Implantable Pulse Generator Implant Date: 20250627

## 2024-02-04 ENCOUNTER — Ambulatory Visit: Admitting: Nurse Practitioner

## 2024-02-11 ENCOUNTER — Ambulatory Visit: Attending: Internal Medicine

## 2024-02-11 ENCOUNTER — Encounter: Payer: Self-pay | Admitting: Nurse Practitioner

## 2024-02-11 ENCOUNTER — Other Ambulatory Visit: Payer: Self-pay

## 2024-02-11 VITALS — BP 146/64 | HR 84

## 2024-02-11 DIAGNOSIS — Z0130 Encounter for examination of blood pressure without abnormal findings: Secondary | ICD-10-CM

## 2024-02-11 DIAGNOSIS — I6523 Occlusion and stenosis of bilateral carotid arteries: Secondary | ICD-10-CM

## 2024-02-11 NOTE — Progress Notes (Signed)
 Patient arrives for nurse visit for BP visit.  Manual BP in office today 146/64  He has provided BP log which was scanned into chart under media.  I will forward to E.Peck,NP for review.    He asks when he can drive again.

## 2024-02-15 ENCOUNTER — Ambulatory Visit: Payer: Self-pay | Admitting: Cardiovascular Disease

## 2024-02-21 DIAGNOSIS — H40012 Open angle with borderline findings, low risk, left eye: Secondary | ICD-10-CM | POA: Diagnosis not present

## 2024-03-04 ENCOUNTER — Ambulatory Visit

## 2024-03-04 DIAGNOSIS — Z87898 Personal history of other specified conditions: Secondary | ICD-10-CM

## 2024-03-08 LAB — CUP PACEART REMOTE DEVICE CHECK
Date Time Interrogation Session: 20250830133937
Implantable Pulse Generator Implant Date: 20250627

## 2024-03-09 ENCOUNTER — Ambulatory Visit: Payer: Self-pay | Admitting: Cardiovascular Disease

## 2024-03-13 ENCOUNTER — Ambulatory Visit: Attending: Cardiology | Admitting: Cardiology

## 2024-03-13 ENCOUNTER — Encounter: Payer: Self-pay | Admitting: Cardiology

## 2024-03-13 VITALS — BP 130/72 | HR 76 | Ht 70.0 in | Wt 191.2 lb

## 2024-03-13 DIAGNOSIS — I10 Essential (primary) hypertension: Secondary | ICD-10-CM | POA: Diagnosis not present

## 2024-03-13 DIAGNOSIS — Z87898 Personal history of other specified conditions: Secondary | ICD-10-CM

## 2024-03-13 DIAGNOSIS — Z952 Presence of prosthetic heart valve: Secondary | ICD-10-CM | POA: Diagnosis not present

## 2024-03-13 DIAGNOSIS — E782 Mixed hyperlipidemia: Secondary | ICD-10-CM | POA: Diagnosis not present

## 2024-03-13 NOTE — Progress Notes (Signed)
 Remote Loop Recorder Transmission

## 2024-03-13 NOTE — Progress Notes (Signed)
 Clinical Summary [name] [name] is a 68 y.o.male seen today for follow up of the following medical problems      1. AVR - history of bicuspid AV, s/p bioprosthetic AVR in 2012 Jan 2023 echo normal AVR - 05/2022 echo normal AVR   -12/2023 echo normal AVR     2. Ascending aortic aneurysm - mild aortic root dilatation at 4.1 to 4.2 cm by last MRI 10/2013, likely related to hx of bicuspid AV and potential coexisting aortopathy - CTA 10/2014 4.1 cm ascending aortic aneurysm.   - CTA 11/2016 stable 4 cm aneurysm   10/2017 CTA Chest/Abd/Pelvis Sovah (in epic labeled as CT A/P): 4 cm aneurysm - no recent chest pain   01/2019 CTA stable 3.7 cm(reported overestimated by prior imaging) 01/2020 CTA 3.9 cm acending aorta, overall stable 06/2021 CT PE: no aneurysm   3. HTN - on higher dose of lisinopril  had symptomatic low bp's - compliant with meds     4. OSA - not compliant with CPAP due to discomfort. Has not been intrested in retyring - not interested in inspire device   5. Hyperlipidemia  05/2022 TC 79 TG 63 HDL 25 LDL 41 11/2022 TC 104 TG 188 HDL 31 LDL 42. HgbA1c 7.8 last check - 05/2023 TC 116 TG 134 HDL 28 LDL 64 - 12/2023 TC 98 TG 113 HDL 29 LDL 48   6. DM2 - followed by pcp - 05/2023 A1c 8.2 - 12/2023 HgbA1c 7.8      7. Pulmonary  Nodules - have been stable by imaging, monitored along with his aneurysm with his annual CT chests   8. Carotid stenosis - followed by vascular 09/2021 carotid US : mod bilateral disease  05/2022 RICA 60-79%, RICA 40-59% - 03/2023 RICA 60-79%, LICA 40-59% - 09/2023 RICA 40-59%, LICA 1-39%       9. Afib, isolated - new diagnosis during Jan 2023 admission - had just been discharged for severe case of COVID pneumonia, readmitted few days later with dehydration and AKI, found to be in afib with RVR.  - he asymptomatic - no bleeding on eliqus - dizziness on dilt 120.     02/2022 30 day monitor: no afib - denies any palpitations.    11. CKD  IIIA  12.Syncope -  admit 12/2023, unclear etiology. Occurred after getting off commode from BM and walking down the hallway. With lack of prodrome some question if possibly cardiogenic - EKG, tele benign - orthostatics were abnormal - loop recorder placed, interrogations have been benign so far.  - no recurrent episodes, no significant dizziness - working to stay hydrated.    Past Medical History:  Diagnosis Date   Aortic stenosis 8/09   Bicuspid aortic valve   Arthritis 6/10   hand    Atrial fibrillation (HCC)    post op only   Bile duct calculus with acute cholecystitis    CAD (coronary artery disease) 8/09   Calculus of common bile duct with acute pancreatitis 11/2016   CTS (carpal tunnel syndrome)    bilateral    Diabetes (HCC)    Diastolic dysfunction 8/09   Elevated LFTs    Fatty liver disease, nonalcoholic    History of kidney stones    Hyperlipidemia    Hypertension    Kidney stones    Nasal congestion    NIDDM (non-insulin  dependent diabetes mellitus)    x15 yrs   OSA on CPAP 2006   Paresthesias in left hand 02/19/2004  Paresthesias in right hand 02/19/2004   Sinusitis    Vitamin D  deficiency 06/13/09     Allergies  Allergen Reactions   Empagliflozin  Diarrhea   Hydrochlorothiazide Nausea Only and Other (See Comments)    Headaches, too   Sulfa Antibiotics Nausea And Vomiting   Glyxambi  [Empagliflozin -Linagliptin ] Diarrhea, Itching and Other (See Comments)    He was able to take Jardiance  alone with no side effects so it was the Tradjenta  part that most likely gave him the reaction     Current Outpatient Medications  Medication Sig Dispense Refill   acetaminophen  (TYLENOL ) 325 MG tablet Take 2 tablets (650 mg total) by mouth every 6 (six) hours as needed for mild pain (or Fever >/= 101). 12 tablet 0   amLODipine  (NORVASC ) 10 MG tablet TAKE 1 TABLET BY MOUTH DAILY 30 tablet 6   aspirin  EC 81 MG tablet Take 1 tablet (81 mg total) by mouth daily. Swallow  whole.     atorvastatin  (LIPITOR) 40 MG tablet Take 1 tablet (40 mg total) by mouth daily. 90 tablet 3   blood glucose meter kit and supplies KIT Dispense based on patient and insurance preference. Use up to four times daily as directed. (FOR ICD-9 250.00, 250.01). 1 each 0   Cholecalciferol (VITAMIN D3) 50 MCG (2000 UT) TABS Take 2,000 Units by mouth 2 (two) times daily.     Continuous Glucose Receiver (FREESTYLE LIBRE 3 READER) DEVI 1 each by Does not apply route 4 (four) times daily. 1 each 1   Continuous Glucose Sensor (FREESTYLE LIBRE 3 PLUS SENSOR) MISC Change sensor every 15 days. 3 each 3   diclofenac  Sodium (VOLTAREN ) 1 % GEL Apply 2 g topically 3 (three) times daily as needed (for pain).     glipiZIDE  (GLUCOTROL ) 5 MG tablet Take 2 tablets (10 mg total) by mouth 2 (two) times daily before a meal. 360 tablet 3   insulin  degludec (TRESIBA  FLEXTOUCH) 100 UNIT/ML FlexTouch Pen Inject 16 Units into the skin daily after breakfast.     Insulin  Pen Needle (NOVOFINE PEN NEEDLE) 32G X 6 MM MISC 1 Needle by Does not apply route 3 (three) times daily. 100 each 3   Insulin  Pen Needle (TRUEPLUS 5-BEVEL PEN NEEDLES) 31G X 6 MM MISC UAD TID Dx E11.69 300 each 3   Lancets (ONETOUCH DELICA PLUS LANCET33G) MISC 4 (four) times daily.     metFORMIN  (GLUCOPHAGE ) 1000 MG tablet Take 1 tablet (1,000 mg total) by mouth 2 (two) times daily with a meal. 180 tablet 3   ONETOUCH ULTRA test strip Test BS 4 times daily Dx E11.69 400 each 3   polyethylene glycol (MIRALAX  / GLYCOLAX ) 17 g packet Take 17 g by mouth daily.     Probiotic Product (ALIGN PO) Take 1 capsule by mouth daily.     No current facility-administered medications for this visit.     Past Surgical History:  Procedure Laterality Date   bladder cancer - operation - 01/21/18 - Dr Alvaro  01/21/2018   CARPAL TUNNEL RELEASE     bilateral    CHOLECYSTECTOMY  11/2016   COLONOSCOPY N/A 09/22/2018   Procedure: COLONOSCOPY;  Surgeon: Golda Claudis PENNER, MD;   Location: [facility];  Service: Endoscopy;  Laterality: N/A;  1:00   CORONARY ANGIOPLASTY WITH STENT PLACEMENT     to rt coronary atery (Dr. Degert-cardiologist)   CYSTOSCOPY WITH RETROGRADE PYELOGRAM, URETEROSCOPY AND STENT PLACEMENT Left 03/07/2018   Procedure: CYSTOSCOPY WITH RETROGRADE PYELOGRAM,  AND STENT PLACEMENT;  Surgeon: Alvaro Hummer, MD;  Location: [facility];  Service: Urology;  Laterality: Left;   ERCP N/A 11/24/2016   Procedure: ENDOSCOPIC RETROGRADE CHOLANGIOPANCREATOGRAPHY (ERCP);  Surgeon: Golda Claudis PENNER, MD;  Location: [facility];  Service: Endoscopy;  Laterality: N/A;   EXTRACORPOREAL SHOCK WAVE LITHOTRIPSY Left 03/17/2018   Procedure: LEFT EXTRACORPOREAL SHOCK WAVE LITHOTRIPSY (ESWL);  Surgeon: Watt Rush, MD;  Location: [facility];  Service: Urology;  Laterality: Left;   LOOP RECORDER INSERTION N/A 12/31/2023   Procedure: LOOP RECORDER INSERTION;  Surgeon: Nancey Eulas BRAVO, MD;  Location: [facility];  Service: Cardiovascular;  Laterality: N/A;   REMOVAL OF STONES N/A 11/24/2016   Procedure: REMOVAL OF STONES;  Surgeon: Golda Claudis PENNER, MD;  Location: [facility];  Service: Endoscopy;  Laterality: N/A;   SPHINCTEROTOMY N/A 11/24/2016   Procedure: SPHINCTEROTOMY;  Surgeon: Golda Claudis PENNER, MD;  Location: [facility];  Service: Endoscopy;  Laterality: N/A;   TISSUE AORTIC VALVE REPLACEMENT     2012     Allergies  Allergen Reactions   Empagliflozin  Diarrhea   Hydrochlorothiazide Nausea Only and Other (See Comments)    Headaches, too   Sulfa Antibiotics Nausea And Vomiting   Glyxambi  [Empagliflozin -Linagliptin ] Diarrhea, Itching and Other (See Comments)    He was able to take Jardiance  alone with no side effects so it was the Tradjenta  part that most likely gave him the reaction      Family History  Problem Relation Age of Onset   Hearing loss Mother    Alzheimer's disease Mother    Hearing loss Father    Diabetes Father    Diabetes Other         Family History      Social History [name] [name] reports that he quit smoking about 21 years ago. His smoking use included cigarettes. He started smoking about 49 years ago. He has a 42 pack-year smoking history. He has never been exposed to tobacco smoke. He has never used smokeless tobacco. [name] [name] reports no history of alcohol use.   Physical Examination Today's Vitals   03/13/24 0818  BP: 130/72  Pulse: 76  SpO2: 97%  Weight: 191 lb 3.2 oz (86.7 kg)  Height: 5' 10 (1.778 m)   Body mass index is 27.43 kg/m.  Gen: resting comfortably, no acute distress HEENT: no scleral icterus, pupils equal round and reactive, no palptable cervical adenopathy,  CV: RRR, no m/rg, no jvd Resp: Clear to auscultation bilaterally GI: abdomen is soft, non-tender, non-distended, normal bowel sounds, no hepatosplenomegaly MSK: extremities are warm, no edema.  Skin: warm, no rash Neuro:  no focal deficits Psych: appropriate affect   Diagnostic Studies  09/2012 MRI/MRA Chest Stable 4 cm ascending thoracic aneurysm   08/2010 Cath HEMODYNAMIC DATA: Right atrial pressures 7/5 with a mean of 4 mmHg,   right ventricular pressure is 24 with an EDP of 7 mmHg. Pulmonary   artery pressure is 20/7 with a mean of 13 mmHg. Pulmonary capillary   wedge pressure is 10/7 with a mean of 6 mmHg. Aortic pressure is 118/67   with a mean of 88 mmHg. Left ventricular pressure is 161 with an EDP of   12 mmHg. There is no significant mitral valve gradient. The aortic   valve mean gradient is 31 mmHg with calculated valve area of 0.8   centimeter squared. Thermodilution cardiac output is 4.4 L per minute   with an index of 2, Fick cardiac outputs 4.7 L per minute with  an index   of 2.2.   ANGIOGRAPHIC DATA: The left ventricular angiography was performed in a   RAO view. This demonstrates normal left ventricular size and   contractility with normal systolic function. Ejection fraction is   estimated at 65%. The aortic  valve is calcified with eccentric opening   and restrictive mobility.   The left coronary artery arises and distributes normally. The left main   coronary has 20% narrowing in the ostium and in the distal left main.   The left anterior descending artery is a tortuous vessel which has less   than 10% irregularities.   Left circumflex coronary artery is rise to first obtuse marginal vessel   which has a 40-50% stenosis proximally.   The right coronary artery is a dominant vessel, it has 20% disease in   the distal vessel. The stent in the midvessel was widely patent.   FINAL INTERPRETATION:   1. Nonobstructive atherosclerotic coronary artery disease.   2. Normal left ventricular function.   3. Severe aortic stenosis.   4. Normal right heart pressures.   PLAN: Proceed with aortic valve replacement.   10/2013 MRA Chest EXAM: MRA CHEST WITH OR WITHOUT CONTRAST   TECHNIQUE: Angiographic images of the chest were obtained using MRA technique without and with intravenous contrast.   CONTRAST:  20mL MULTIHANCE  GADOBENATE DIMEGLUMINE  529 MG/ML IV SOLN   COMPARISON:  CT UROGRAM dated 05/08/2011; MR MRA CHEST W/ OR W/O CM dated 10/07/2012; CT ANGIO CHEST dated 09/08/2010   FINDINGS: There is stable aneurysmal dilatation of the aortic root that does not involve the sinuses of Valsalva. No aortic dissection is present. Artifact is noted from a prosthetic aortic valve. Diameter at the level of the sinuses of Valsalva is approximately 3.2 cm. Maximal caliber of the ascending thoracic aorta is 4.1- 4.2 cm. The proximal arch measures 3.8 cm. The distal arch measures 2.6 cm. The descending thoracic aorta measures 2.6 cm. Proximal great vessels show stable and normal patency without anatomical variant.   The heart size is normal. No pleural or pericardial fluid is identified. No masses or lymphadenopathy are seen. Visualized spine shows no abnormalities.   IMPRESSION: Stable aneurysmal dilatation  of the ascending thoracic aorta measuring 4.1- 4.2 cm in greatest diameter. No dissection is identified.   10/2014 CTA Chest IMPRESSION: 1. Mild fusiform aneurysmal dilatation of the ascending thoracic aorta measuring approximately 41 mm in maximal diameter, stable since the 2012 examination. 2. Stable sequela of prior median sternotomy and aortic valve replacement. 3. Coronary artery calcifications. 4. Punctate (approximately 5 mm) in determine right lower lobe pulmonary nodule, not definitely seen on the 20/2012 examination. If the patient is at high risk for bronchogenic carcinoma, follow-up chest CT at 6-12 months is recommended. If the patient is at low risk for bronchogenic carcinoma, follow-up chest CT at 12 months is recommended. This recommendation follows the consensus statement:   11/2015 CTA chest   IMPRESSION: Mild aneurysmal dilatation of the ascending aorta with a maximal diameter of 4.0 cm. This is not significantly changed.     11/2016 echo Study Conclusions   - Left ventricle: The cavity size was normal. Wall thickness was   normal. Systolic function was normal. The estimated ejection   fraction was in the range of 60% to 65%. Wall motion was normal;   there were no regional wall motion abnormalities. Left   ventricular diastolic function parameters were normal. - Aortic valve: There isa 25-mm Edwards pericardial Magna-Ease   valve  model #[id number] in the AV position. Mildly calcified   annulus. Trileaflet; normal thickness leaflets. There is a mild   gradient across the prosthetic valve. Mean gradient (S): 12 mm   Hg. Mean gradient of 18 mmHg with pedhoff probe. Valve area   (VTI): 1.71 cm^2. - Technically adequate study.   11/2016 CTA chest IMPRESSION: 1. Stable mild fusiform aneurysm of the ascending aorta, measuring maximum diameter of 4.0 cm. Recommend annual imaging followup by CTA or MRA. This recommendation follows  2010 ACCF/AHA/AATS/ACR/ASA/SCA/SCAI/SIR/STS/SVM Guidelines for the Diagnosis and Management of Patients with Thoracic Aortic Disease. Circulation. 2010; 121: z733-z630 2. Long-term stability of pulmonary nodules, consistent with benign process.     01/2019 Chest CTA IMPRESSION: 1. Stable appearance of ascending thoracic aorta. Maximal measurement is 3.7 cm. This was overestimated on axial imaging previously. Recommend annual imaging followup by CTA or MRA. This recommendation follows 2010 ACCF/AHA/AATS/ACR/ASA/SCA/SCAI/SIR/STS/SVM Guidelines for the Diagnosis and Management of Patients with Thoracic Aortic Disease. Circulation.2010; 121: [id number]-z630. Aortic aneurysm NOS (ICD10-I71.9) 2. Stable peripheral pleural based subcentimeter pulmonary nodules, likely inflammatory. No follow-up necessary. 3.  Aortic Atherosclerosis (ICD10-I70.0).   01/2020 CTA IMPRESSION: 1. Ascending aorta is at the upper limits of normal in size, stable. 2. Hepatic steatosis. Mild marginal irregularity the liver is indicative of cirrhosis. 3. Aortic atherosclerosis (ICD10-I70.0). Coronary artery calcification. 4.  Emphysema (ICD10-J43.9).       Jan 2023 echo   IMPRESSIONS     1. Left ventricular ejection fraction, by estimation, is 60 to 65%. The  left ventricle has normal function. The left ventricle has no regional  wall motion abnormalities. There is mild left ventricular hypertrophy.  Left ventricular diastolic parameters  were normal.   2. Right ventricular systolic function is normal. The right ventricular  size is normal. Tricuspid regurgitation signal is inadequate for assessing  PA pressure.   3. The mitral valve is degenerative. Trivial mitral valve regurgitation.  No evidence of mitral stenosis. The mean mitral valve gradient is 2.0  mmHg.   4. The aortic valve has been repaired/replaced. Aortic valve  regurgitation is not visualized. There is a 25 mm [facility] Ease  pericardial  valve present in the aortic position. Aortic valve mean  gradient measures 6.5 mmHg. Grossly normal prosthetic  function.   5. The inferior vena cava is normal in size with greater than 50%  respiratory variability, suggesting right atrial pressure of 3 mmHg.     02/2022 monitor 30 day monitor   Min HR 51, Max HR 107, Avg HR 70   No symptoms reported   Telemetry tracings show normal sinus rhythm, no significant arrhythmias     05/2022 echo 1. Left ventricular ejection fraction, by estimation, is 55 to 60%. The  left ventricle has normal function. The left ventricle has no regional  wall motion abnormalities. There is mild left ventricular hypertrophy.  Left ventricular diastolic parameters  are indeterminate. Elevated left atrial pressure.   2. Right ventricular systolic function is normal. The right ventricular  size is normal. There is normal pulmonary artery systolic pressure.   3. The mitral valve is normal in structure. No evidence of mitral valve  regurgitation. No evidence of mitral stenosis.   4. 25-mm Edwards pericardial valve is in the AV position. . The aortic  valve has been repaired/replaced. Aortic valve regurgitation is not  visualized. No aortic stenosis is present.   5. Aortic dilatation noted. There is mild dilatation of the aortic root,  measuring 40 mm.   6.  The inferior vena cava is normal in size with greater than 50%  respiratory variability, suggesting right atrial pressure of 3 mmHg.    05/2022 carotid US  Right Carotid: Velocities in the right ICA are consistent with a 60-79%                 stenosis, calcific plaque may obscure higher velocity.   Left Carotid: Velocities in the left ICA are consistent with a 40-59%  stenosis.   Vertebrals: Bilateral vertebral arteries demonstrate antegrade flow.  Subclavians: Normal flow hemodynamics were seen in bilateral subclavian               arteries.     09/2023 echo 1. Left ventricular ejection fraction, by  estimation, is 60 to 65%. Left  ventricular ejection fraction by 3D volume is 64 %. The left ventricle has  normal function. The left ventricle has no regional wall motion  abnormalities. Left ventricular diastolic   parameters are consistent with Grade I diastolic dysfunction (impaired  relaxation). Elevated left ventricular end-diastolic pressure. The average  left ventricular global longitudinal strain is -17.9 %. The global  longitudinal strain is normal.   2. Right ventricular systolic function is normal. The right ventricular  size is normal. There is normal pulmonary artery systolic pressure.   3. The mitral valve is normal in structure. Trivial mitral valve  regurgitation. No evidence of mitral stenosis.   4. The aortic valve has been repaired/replaced. Aortic valve  regurgitation is not visualized. No aortic stenosis is present. There is a  25 mm Edwards [employer] # [id number] valve present in the  aortic position. Echo findings are  consistent with normal structure and function of the aortic valve  prosthesis.   5. There is mild dilatation of the ascending aorta, measuring 40 mm.   6. The inferior vena cava is normal in size with greater than 50%  respiratory variability, suggesting right atrial pressure of 3 mmHg.    12/2023 echo 1. Left ventricular ejection fraction, by estimation, is 60 to 65%. The  left ventricle has normal function. The left ventricle has no regional  wall motion abnormalities. Left ventricular diastolic parameters are  consistent with Grade I diastolic  dysfunction (impaired relaxation).   2. Right ventricular systolic function is normal. The right ventricular  size is normal. Tricuspid regurgitation signal is inadequate for assessing  PA pressure.   3. Left atrial size was mildly dilated.   4. The mitral valve is normal in structure. No evidence of mitral valve  regurgitation.   5. The aortic valve has been repaired/replaced. Aortic  valve  regurgitation is not visualized. No aortic stenosis is present. There is a  25 mm Magna valve present in the aortic position. Aortic valve mean  gradient measures 10.0 mmHg. Aortic valve Vmax  measures 2.22 m/s. Aortic valve acceleration time measures 92 msec.    Assessment and Plan   1. Aortic valve replacement - hx of bicuspid AV, s/p bioprosthetic AVR in 2012 - normal recent function by echo, continue to monitor   2.. HTN -bp is at goal today. Some elevated home bp's at times. With suspected recent orthostatic syncope would not be more aggressive with bp control at this time.          3. Hyperlipidemia -his LDL is at goal, continue current therapy   4. Carotid stenosis -mild to moderate bilateral disease, conitnue to monitor   5. Syncope - most likely orthostatic syncope. Occurred after having  a bowel movement and getting up from commode walking down the hall. He was orthostatic in the hospital. Often strains to have a bowel movement - loop recorder benign thus far. - 2.5 months without any reoccurences. This was likely orthostatic syncope, at this time ok for patient to resume driving.    F/u 6 months    Dorn PHEBE Ross, M.D.

## 2024-03-13 NOTE — Patient Instructions (Signed)
 Medication Instructions:  Continue all current medications.   Labwork: none  Testing/Procedures: none  Follow-Up: 6 months   Any Other Special Instructions Will Be Listed Below (If Applicable).   If you need a refill on your cardiac medications before your next appointment, please call your pharmacy.

## 2024-03-16 ENCOUNTER — Encounter: Payer: Self-pay | Admitting: Family Medicine

## 2024-03-16 ENCOUNTER — Ambulatory Visit (INDEPENDENT_AMBULATORY_CARE_PROVIDER_SITE_OTHER): Admitting: Family Medicine

## 2024-03-16 VITALS — BP 129/78 | HR 69 | Ht 70.0 in | Wt 190.0 lb

## 2024-03-16 DIAGNOSIS — N182 Chronic kidney disease, stage 2 (mild): Secondary | ICD-10-CM | POA: Diagnosis not present

## 2024-03-16 DIAGNOSIS — I10 Essential (primary) hypertension: Secondary | ICD-10-CM | POA: Diagnosis not present

## 2024-03-16 DIAGNOSIS — E785 Hyperlipidemia, unspecified: Secondary | ICD-10-CM

## 2024-03-16 DIAGNOSIS — E1169 Type 2 diabetes mellitus with other specified complication: Secondary | ICD-10-CM | POA: Diagnosis not present

## 2024-03-16 DIAGNOSIS — Z23 Encounter for immunization: Secondary | ICD-10-CM | POA: Diagnosis not present

## 2024-03-16 DIAGNOSIS — Z7984 Long term (current) use of oral hypoglycemic drugs: Secondary | ICD-10-CM

## 2024-03-16 LAB — BMP8+EGFR
BUN/Creatinine Ratio: 16 (ref 10–24)
BUN: 24 mg/dL (ref 8–27)
CO2: 23 mmol/L (ref 20–29)
Calcium: 9.8 mg/dL (ref 8.6–10.2)
Chloride: 102 mmol/L (ref 96–106)
Creatinine, Ser: 1.5 mg/dL — ABNORMAL HIGH (ref 0.76–1.27)
Glucose: 174 mg/dL — ABNORMAL HIGH (ref 70–99)
Potassium: 4.5 mmol/L (ref 3.5–5.2)
Sodium: 142 mmol/L (ref 134–144)
eGFR: 50 mL/min/1.73 — ABNORMAL LOW (ref >59)

## 2024-03-16 LAB — CBC WITH DIFFERENTIAL/PLATELET
Basophils Absolute: 0 x10E3/uL (ref 0.0–0.2)
Basos: 1 %
EOS (ABSOLUTE): 0.2 x10E3/uL (ref 0.0–0.4)
Eos: 3 %
Hematocrit: 41 % (ref 37.5–51.0)
Hemoglobin: 13.5 g/dL (ref 13.0–17.7)
Immature Grans (Abs): 0 x10E3/uL (ref 0.0–0.1)
Immature Granulocytes: 0 %
Lymphocytes Absolute: 1.4 x10E3/uL (ref 0.7–3.1)
Lymphs: 21 %
MCH: 31.9 pg (ref 26.6–33.0)
MCHC: 32.9 g/dL (ref 31.5–35.7)
MCV: 97 fL (ref 79–97)
Monocytes Absolute: 0.6 x10E3/uL (ref 0.1–0.9)
Monocytes: 8 %
Neutrophils Absolute: 4.4 x10E3/uL (ref 1.4–7.0)
Neutrophils: 67 %
Platelets: 223 x10E3/uL (ref 150–450)
RBC: 4.23 x10E6/uL (ref 4.14–5.80)
RDW: 12.5 % (ref 11.6–15.4)
WBC: 6.6 x10E3/uL (ref 3.4–10.8)

## 2024-03-16 LAB — LIPID PANEL
Chol/HDL Ratio: 4 ratio (ref 0.0–5.0)
Cholesterol, Total: 129 mg/dL (ref 100–199)
HDL: 32 mg/dL — ABNORMAL LOW (ref >39)
LDL Chol Calc (NIH): 68 mg/dL (ref 0–99)
Triglycerides: 169 mg/dL — ABNORMAL HIGH (ref 0–149)
VLDL Cholesterol Cal: 29 mg/dL (ref 5–40)

## 2024-03-16 LAB — BAYER DCA HB A1C WAIVED: HB A1C (BAYER DCA - WAIVED): 7.8 % — ABNORMAL HIGH (ref 4.8–5.6)

## 2024-03-16 MED ORDER — ATORVASTATIN CALCIUM 40 MG PO TABS: 40.0000 mg | ORAL_TABLET | Freq: Every day | ORAL | 3 refills | Status: DC

## 2024-03-16 MED ORDER — NOVOLOG FLEXPEN 100 UNIT/ML ~~LOC~~ SOPN: 8.0000 [IU] | PEN_INJECTOR | Freq: Three times a day (TID) | SUBCUTANEOUS | 3 refills | Status: AC

## 2024-03-16 NOTE — Progress Notes (Signed)
 BP 129/78   Pulse 69   Ht 5' 10 (1.778 m)   Wt 190 lb (86.2 kg)   SpO2 97%   BMI 27.26 kg/m    Subjective:   Patient ID: [name] [name], male    DOB: 1955/12/02, 68 y.o.   MRN: [id number]  HPI: [name] [name] is a 68 y.o. male presenting on 03/16/2024 for Medical Management of Chronic Issues, Diabetes, Hypertension, and Hyperlipidemia   Discussed the use of AI scribe software for clinical note transcription with the patient, who gave verbal consent to proceed.  History of Present Illness   [name] [name] is a 67 year old male with diabetes who presents for a follow-up on blood sugar management.  His blood sugar levels have been consistently high post-lunch, reaching up to 300 mg/dL, while morning levels range from 130 to 150 mg/dL. He attributes the post-lunch spike to consuming a large meal, which is often his only meal of the day. His diet includes items like pizza, tacos, chicken, and beef tips. He is currently taking glipizide , metformin , and 16 units of Tresiba  daily. He has a history of using mealtime insulin  such as Novolog  or Humalog in the past. He typically has lunch between 11:30 AM and 1:00 PM and finds it challenging to manage his blood sugar levels with his current dietary pattern.  He experiences night sweats. He has no issues with his current cholesterol medication, atorvastatin , but notes sweating as a symptom.  He has difficulty falling asleep two to three times a week and has tried melatonin in the past without significant benefit.  He mentions a fall in June that resulted in persistent pain on the side of his face, especially when opening his mouth wide. The pain has decreased over time but remains present.          Relevant past medical, surgical, family and social history reviewed and updated as indicated. Interim medical history since our last visit reviewed. Allergies and medications reviewed and updated.  Review of Systems  Constitutional:   Negative for chills and fever.  Eyes:  Negative for visual disturbance.  Respiratory:  Negative for shortness of breath and wheezing.   Cardiovascular:  Negative for chest pain and leg swelling.  Musculoskeletal:  Negative for back pain and gait problem.  Skin:  Negative for rash.  Neurological:  Negative for dizziness and light-headedness.  Psychiatric/Behavioral:  Positive for sleep disturbance.   All other systems reviewed and are negative.   Per HPI unless specifically indicated above   Allergies as of 03/16/2024       Reactions   Empagliflozin  Diarrhea   Hydrochlorothiazide Nausea Only, Other (See Comments)   Headaches, too   Sulfa Antibiotics Nausea And Vomiting   Glyxambi  [empagliflozin -linagliptin ] Diarrhea, Itching, Other (See Comments)   He was able to take Jardiance  alone with no side effects so it was the Tradjenta  part that most likely gave him the reaction        Medication List        Accurate as of March 16, 2024 10:19 AM. If you have any questions, ask your nurse or doctor.          acetaminophen  325 MG tablet Commonly known as: TYLENOL  Take 2 tablets (650 mg total) by mouth every 6 (six) hours as needed for mild pain (or Fever >/= 101).   ALIGN PO Take 1 capsule by mouth daily.   amLODipine  10 MG tablet Commonly known as: NORVASC  TAKE 1 TABLET  BY MOUTH DAILY   aspirin  EC 81 MG tablet Take 1 tablet (81 mg total) by mouth daily. Swallow whole.   atorvastatin  40 MG tablet Commonly known as: LIPITOR Take 1 tablet (40 mg total) by mouth daily.   blood glucose meter kit and supplies Kit Dispense based on patient and insurance preference. Use up to four times daily as directed. (FOR ICD-9 250.00, 250.01).   diclofenac  Sodium 1 % Gel Commonly known as: VOLTAREN  Apply 2 g topically 3 (three) times daily as needed (for pain).   FreeStyle Libre 3 Plus Sensor Misc Change sensor every 15 days.   FreeStyle Libre 3 Reader Devi 1 each by Does not  apply route 4 (four) times daily.   glipiZIDE  5 MG tablet Commonly known as: GLUCOTROL  Take 2 tablets (10 mg total) by mouth 2 (two) times daily before a meal.   metFORMIN  1000 MG tablet Commonly known as: GLUCOPHAGE  Take 1 tablet (1,000 mg total) by mouth 2 (two) times daily with a meal.   Novofine Pen Needle 32G X 6 MM Misc Generic drug: Insulin  Pen Needle 1 Needle by Does not apply route 3 (three) times daily.   TRUEplus 5-Bevel Pen Needles 31G X 6 MM Misc Generic drug: Insulin  Pen Needle UAD TID Dx E11.69   NovoLOG  FlexPen 100 UNIT/ML FlexPen Generic drug: insulin  aspart Inject 8 Units into the skin 3 (three) times daily with meals. Started by: Fonda LABOR Lillien Petronio   OneTouch Delica Plus Lancet33G Misc 4 (four) times daily.   OneTouch Ultra test strip Generic drug: glucose blood Test BS 4 times daily Dx E11.69   polyethylene glycol 17 g packet Commonly known as: MIRALAX  / GLYCOLAX  Take 17 g by mouth daily.   Tresiba  FlexTouch 100 UNIT/ML FlexTouch Pen Generic drug: insulin  degludec Inject 16 Units into the skin daily after breakfast.   Vitamin D3 50 MCG (2000 UT) Tabs Take 2,000 Units by mouth 2 (two) times daily.         Objective:   BP 129/78   Pulse 69   Ht 5' 10 (1.778 m)   Wt 190 lb (86.2 kg)   SpO2 97%   BMI 27.26 kg/m   Wt Readings from Last 3 Encounters:  03/16/24 190 lb (86.2 kg)  03/13/24 191 lb 3.2 oz (86.7 kg)  01/21/24 191 lb 3.2 oz (86.7 kg)    Physical Exam Physical Exam   VITALS: BP- 129/78 NECK: Thyroid  without lumps. CHEST: Lungs clear to auscultation. CARDIOVASCULAR: Heart sounds regular, no murmurs. EXTREMITIES: No cuts, no sores. Good pulses. NEUROLOGICAL: Decreased sensation on left foot near toes.         Assessment & Plan:   Problem List Items Addressed This Visit       Cardiovascular and Mediastinum   Essential hypertension   Relevant Medications   atorvastatin  (LIPITOR) 40 MG tablet     Endocrine   Type 2  diabetes mellitus (HCC) - Primary   Relevant Medications   atorvastatin  (LIPITOR) 40 MG tablet   insulin  aspart (NOVOLOG  FLEXPEN) 100 UNIT/ML FlexPen   Other Relevant Orders   Bayer DCA Hb A1c Waived   BMP8+EGFR   Lipid panel   CBC with Differential/Platelet   Microalbumin/Creatinine Ratio, Urine   Type 2 diabetes mellitus with hyperlipidemia (HCC)   Relevant Medications   atorvastatin  (LIPITOR) 40 MG tablet   insulin  aspart (NOVOLOG  FLEXPEN) 100 UNIT/ML FlexPen     Genitourinary   CKD (chronic kidney disease) stage 2, GFR 60-89 ml/min   Relevant  Orders   Bayer DCA Hb A1c Waived   BMP8+EGFR   Lipid panel   CBC with Differential/Platelet     Other   Hyperlipidemia with target LDL less than 70   Relevant Medications   atorvastatin  (LIPITOR) 40 MG tablet       Type 2 diabetes mellitus with postprandial hyperglycemia Postprandial hyperglycemia with blood glucose levels reaching 300 mg/dL after lunch. Hemoglobin A1c is 7.8%. Hyperglycemia attributed to large lunchtime meal. - Prescribed Novolog  FlexPen, 8 units before lunch, adjust by 2 units based on postprandial levels. - Target postprandial blood glucose under 180 mg/dL one hour after eating. - Prescribe Humalog if Novolog  not covered. - Encouraged smaller, frequent meals.  Chronic kidney disease, stage 2 Plan to monitor kidney function. - Collect urine sample for kidney function assessment.  Essential hypertension Blood pressure well-controlled at 129/78 mmHg. - Continue amlodipine .  Hyperlipidemia Continues atorvastatin . Night sweats possibly related to elevated blood glucose. - Continue atorvastatin .  Insomnia, initial (difficulty falling asleep) Difficulty falling asleep 2-3 times per week. Melatonin previously ineffective. - Recommend Tylenol  PM or melatonin (up to 20 mg). - Consider prescription options if ineffective.  Chronic left facial pain after fall Persistent left facial pain since June, less severe  but present when opening mouth wide. - Examine left side of face for further assessment.          Follow up plan: Return in about 3 months (around 06/15/2024), or if symptoms worsen or fail to improve, for Recheck diabetes.  Counseling provided for all of the vaccine components Orders Placed This Encounter  Procedures   Bayer DCA Hb A1c Waived   BMP8+EGFR   Lipid panel   CBC with Differential/Platelet   Microalbumin/Creatinine Ratio, Urine    Fonda Levins, MD [facility] 03/16/2024, 10:19 AM

## 2024-03-17 ENCOUNTER — Telehealth: Payer: Self-pay | Admitting: Pharmacist

## 2024-03-17 ENCOUNTER — Telehealth: Payer: Self-pay | Admitting: Cardiovascular Disease

## 2024-03-17 DIAGNOSIS — E1169 Type 2 diabetes mellitus with other specified complication: Secondary | ICD-10-CM

## 2024-03-17 LAB — MICROALBUMIN / CREATININE URINE RATIO
Creatinine, Urine: 215.6 mg/dL
Microalb/Creat Ratio: 367 mg/g{creat} — ABNORMAL HIGH (ref 0–29)
Microalbumin, Urine: 792 ug/mL

## 2024-03-17 NOTE — Telephone Encounter (Signed)
 Patient called back to [facility] and left a message. I called to follow up with the patient in regards to his symptom activation last night. Per the patient, he advised this was a mistake he was having no symptoms and only hit the button due to a message he received that his monitor was not connecting.   I did advise the patient that we have been seeing an increase in PVC's on his ILR since the beginning of July.  The patient was unaware of ever being told he has had PVC's and again advised he is having no symptoms associated with these.   Inquired if any recent medication changes. Per Mr. Voong, he was hospitalized in late June for syncope (fell in the bathroom at home after getting up with having had a normal BM) and his atenolol  was stopped.   The patient is aware I will forward his transmission to Dr. Nancey and Dr. Alvan for further review. He is advised that we will call back once MD reviews.   The patient voices understanding and is agreeable.

## 2024-03-17 NOTE — Telephone Encounter (Signed)
 Please enroll patient in the novo nordisk PAP for tresiba  and novolog  Starting novolog  with dinner Only eats one meal daily Consider adding Mounjaro Can't tolerate rybelsus  and ozempic  Counseling provided to patient   [id number] placed for pharmacy re-engagement

## 2024-03-17 NOTE — Telephone Encounter (Signed)
 Alert received from [employer]:  Alert remote transmission: Symptom 1 symptom activation c/w SR with PVC's Presenting rhythm SR with bigeminal PVC's - route to triage  ___________________________________________________________________________  Attempted to contact the patient to discuss symptoms. No answer- I left a message to please call back.   PVC burden has been trending up since early July 2025.  Reviewed prior monthly summary report dated 02/02/24- PVC burden was 13.2% Current PVC burden is 14.6%  Reviewed the patient's medication list and he is not currently taking a beta blocker.

## 2024-03-20 NOTE — Telephone Encounter (Signed)
 Atenolol  had been stopped for orthostatic hypotension, if PVCs asymptomatic to me don't see strong indication for intervention but will defer to Dr Nancey  JINNY Ross MD

## 2024-03-21 ENCOUNTER — Other Ambulatory Visit: Payer: Self-pay

## 2024-03-21 ENCOUNTER — Emergency Department (HOSPITAL_COMMUNITY)

## 2024-03-21 ENCOUNTER — Encounter (HOSPITAL_COMMUNITY): Payer: Self-pay

## 2024-03-21 ENCOUNTER — Emergency Department (HOSPITAL_COMMUNITY)
Admission: EM | Admit: 2024-03-21 | Discharge: 2024-03-21 | Disposition: A | Discharge status: Home / Self Care | Attending: Emergency Medicine | Admitting: Emergency Medicine

## 2024-03-21 DIAGNOSIS — R9082 White matter disease, unspecified: Secondary | ICD-10-CM | POA: Insufficient documentation

## 2024-03-21 DIAGNOSIS — I4891 Unspecified atrial fibrillation: Secondary | ICD-10-CM | POA: Diagnosis not present

## 2024-03-21 DIAGNOSIS — I10 Essential (primary) hypertension: Secondary | ICD-10-CM | POA: Insufficient documentation

## 2024-03-21 DIAGNOSIS — M879 Osteonecrosis, unspecified: Secondary | ICD-10-CM | POA: Insufficient documentation

## 2024-03-21 DIAGNOSIS — I2510 Atherosclerotic heart disease of native coronary artery without angina pectoris: Secondary | ICD-10-CM | POA: Diagnosis not present

## 2024-03-21 DIAGNOSIS — I493 Ventricular premature depolarization: Secondary | ICD-10-CM | POA: Diagnosis not present

## 2024-03-21 DIAGNOSIS — Z87891 Personal history of nicotine dependence: Secondary | ICD-10-CM | POA: Diagnosis not present

## 2024-03-21 DIAGNOSIS — R202 Paresthesia of skin: Secondary | ICD-10-CM | POA: Diagnosis not present

## 2024-03-21 DIAGNOSIS — J439 Emphysema, unspecified: Secondary | ICD-10-CM | POA: Insufficient documentation

## 2024-03-21 DIAGNOSIS — R079 Chest pain, unspecified: Secondary | ICD-10-CM | POA: Diagnosis not present

## 2024-03-21 DIAGNOSIS — N200 Calculus of kidney: Secondary | ICD-10-CM | POA: Insufficient documentation

## 2024-03-21 DIAGNOSIS — R29818 Other symptoms and signs involving the nervous system: Secondary | ICD-10-CM | POA: Diagnosis not present

## 2024-03-21 DIAGNOSIS — R9089 Other abnormal findings on diagnostic imaging of central nervous system: Secondary | ICD-10-CM | POA: Diagnosis not present

## 2024-03-21 DIAGNOSIS — I700 Atherosclerosis of aorta: Secondary | ICD-10-CM | POA: Diagnosis not present

## 2024-03-21 DIAGNOSIS — I7121 Aneurysm of the ascending aorta, without rupture: Secondary | ICD-10-CM | POA: Diagnosis not present

## 2024-03-21 DIAGNOSIS — Q272 Other congenital malformations of renal artery: Secondary | ICD-10-CM | POA: Diagnosis not present

## 2024-03-21 DIAGNOSIS — R200 Anesthesia of skin: Secondary | ICD-10-CM | POA: Diagnosis not present

## 2024-03-21 DIAGNOSIS — R519 Headache, unspecified: Secondary | ICD-10-CM | POA: Diagnosis not present

## 2024-03-21 LAB — DIFFERENTIAL
Abs Immature Granulocytes: 0.02 K/uL (ref 0.00–0.07)
Basophils Absolute: 0 K/uL (ref 0.0–0.1)
Basophils Relative: 1 %
Eosinophils Absolute: 0.2 K/uL (ref 0.0–0.5)
Eosinophils Relative: 2 %
Immature Granulocytes: 0 %
Lymphocytes Relative: 15 %
Lymphs Abs: 1 K/uL (ref 0.7–4.0)
Monocytes Absolute: 0.5 K/uL (ref 0.1–1.0)
Monocytes Relative: 8 %
Neutro Abs: 4.8 K/uL (ref 1.7–7.7)
Neutrophils Relative %: 74 %

## 2024-03-21 LAB — COMPREHENSIVE METABOLIC PANEL WITH GFR
ALT: 30 U/L (ref 0–44)
AST: 22 U/L (ref 15–41)
Albumin: 4.2 g/dL (ref 3.5–5.0)
Alkaline Phosphatase: 76 U/L (ref 38–126)
Anion gap: 11 (ref 5–15)
BUN: 23 mg/dL (ref 8–23)
CO2: 23 mmol/L (ref 22–32)
Calcium: 9.2 mg/dL (ref 8.9–10.3)
Chloride: 103 mmol/L (ref 98–111)
Creatinine, Ser: 1.31 mg/dL — ABNORMAL HIGH (ref 0.61–1.24)
GFR, Estimated: 59 mL/min — ABNORMAL LOW (ref >60)
Glucose, Bld: 195 mg/dL — ABNORMAL HIGH (ref 70–99)
Potassium: 3.9 mmol/L (ref 3.5–5.1)
Sodium: 137 mmol/L (ref 135–145)
Total Bilirubin: 0.6 mg/dL (ref 0.0–1.2)
Total Protein: 7.5 g/dL (ref 6.5–8.1)

## 2024-03-21 LAB — CBC
HCT: 39.6 % (ref 39.0–52.0)
Hemoglobin: 13.4 g/dL (ref 13.0–17.0)
MCH: 32.5 pg (ref 26.0–34.0)
MCHC: 33.8 g/dL (ref 30.0–36.0)
MCV: 96.1 fL (ref 80.0–100.0)
Platelets: 197 K/uL (ref 150–400)
RBC: 4.12 MIL/uL — ABNORMAL LOW (ref 4.22–5.81)
RDW: 12.3 % (ref 11.5–15.5)
WBC: 6.5 K/uL (ref 4.0–10.5)
nRBC: 0 % (ref 0.0–0.2)

## 2024-03-21 LAB — RAPID URINE DRUG SCREEN, HOSP PERFORMED
Amphetamines: NOT DETECTED
Barbiturates: NOT DETECTED
Benzodiazepines: NOT DETECTED
Cocaine: NOT DETECTED
Opiates: NOT DETECTED
Tetrahydrocannabinol: NOT DETECTED

## 2024-03-21 LAB — PROTIME-INR
INR: 0.9 (ref 0.8–1.2)
Prothrombin Time: 12.2 s (ref 11.4–15.2)

## 2024-03-21 LAB — I-STAT CHEM 8, ED
BUN: 23 mg/dL (ref 8–23)
Calcium, Ion: 1.22 mmol/L (ref 1.15–1.40)
Chloride: 103 mmol/L (ref 98–111)
Creatinine, Ser: 1.4 mg/dL — ABNORMAL HIGH (ref 0.61–1.24)
Glucose, Bld: 199 mg/dL — ABNORMAL HIGH (ref 70–99)
HCT: 39 % (ref 39.0–52.0)
Hemoglobin: 13.3 g/dL (ref 13.0–17.0)
Potassium: 4 mmol/L (ref 3.5–5.1)
Sodium: 140 mmol/L (ref 135–145)
TCO2: 23 mmol/L (ref 22–32)

## 2024-03-21 LAB — ETHANOL: Alcohol, Ethyl (B): <15 mg/dL (ref <15)

## 2024-03-21 LAB — APTT: aPTT: 25 s (ref 24–36)

## 2024-03-21 LAB — TROPONIN I (HIGH SENSITIVITY)
Troponin I (High Sensitivity): 5 ng/L (ref <18)
Troponin I (High Sensitivity): 5 ng/L (ref <18)

## 2024-03-21 MED ORDER — MIDAZOLAM HCL 2 MG/2ML IJ SOLN: 1.0000 mg | Freq: Once | INTRAMUSCULAR | Status: DC | PRN

## 2024-03-21 MED ORDER — IOHEXOL 350 MG/ML SOLN
100.0000 mL | Freq: Once | INTRAVENOUS | Status: AC | PRN
  Administered 2024-03-21: 100 mL via INTRAVENOUS

## 2024-03-21 NOTE — ED Provider Notes (Signed)
 [facility] Emergency Department Provider Note MRN:  [id number]  Arrival date & time: 03/21/24     Chief Complaint   Hypertension and Numbness   History of Present Illness   [name] [name] is a 68 y.o. year-old male with a history of CAD, A-fib presenting to the ED with chief complaint of hypertension and numbness.  Started feeling generally unwell earlier this morning with chest tightness, noticed his blood pressure was high.  Feeling some tingling in the arms, also feeling numbness in the legs.    Review of Systems  A thorough review of systems was obtained and all systems are negative except as noted in the HPI and PMH.   Patient's Health History    Past Medical History:  Diagnosis Date   Aortic stenosis 8/09   Bicuspid aortic valve   Arthritis 6/10   hand    Atrial fibrillation (HCC)    post op only   Bile duct calculus with acute cholecystitis    CAD (coronary artery disease) 8/09   Calculus of common bile duct with acute pancreatitis 11/2016   CTS (carpal tunnel syndrome)    bilateral    Diabetes (HCC)    Diastolic dysfunction 8/09   Elevated LFTs    Fatty liver disease, nonalcoholic    History of kidney stones    Hyperlipidemia    Hypertension    Kidney stones    Nasal congestion    NIDDM (non-insulin  dependent diabetes mellitus)    x15 yrs   OSA on CPAP 2006   Paresthesias in left hand 02/19/2004   Paresthesias in right hand 02/19/2004   Sinusitis    Vitamin D  deficiency 06/13/09    Past Surgical History:  Procedure Laterality Date   bladder cancer - operation - 01/21/18 - Dr Alvaro  01/21/2018   CARPAL TUNNEL RELEASE     bilateral    CHOLECYSTECTOMY  11/2016   COLONOSCOPY N/A 09/22/2018   Procedure: COLONOSCOPY;  Surgeon: Golda Claudis PENNER, MD;  Location: [facility];  Service: Endoscopy;  Laterality: N/A;  1:00   CORONARY ANGIOPLASTY WITH STENT PLACEMENT     to rt coronary atery (Dr. Degert-cardiologist)   CYSTOSCOPY WITH  RETROGRADE PYELOGRAM, URETEROSCOPY AND STENT PLACEMENT Left 03/07/2018   Procedure: CYSTOSCOPY WITH RETROGRADE PYELOGRAM,  AND STENT PLACEMENT;  Surgeon: Alvaro Hummer, MD;  Location: [facility];  Service: Urology;  Laterality: Left;   ERCP N/A 11/24/2016   Procedure: ENDOSCOPIC RETROGRADE CHOLANGIOPANCREATOGRAPHY (ERCP);  Surgeon: Golda Claudis PENNER, MD;  Location: [facility];  Service: Endoscopy;  Laterality: N/A;   EXTRACORPOREAL SHOCK WAVE LITHOTRIPSY Left 03/17/2018   Procedure: LEFT EXTRACORPOREAL SHOCK WAVE LITHOTRIPSY (ESWL);  Surgeon: Watt Rush, MD;  Location: [facility];  Service: Urology;  Laterality: Left;   LOOP RECORDER INSERTION N/A 12/31/2023   Procedure: LOOP RECORDER INSERTION;  Surgeon: Nancey Eulas BRAVO, MD;  Location: [facility];  Service: Cardiovascular;  Laterality: N/A;   REMOVAL OF STONES N/A 11/24/2016   Procedure: REMOVAL OF STONES;  Surgeon: Golda Claudis PENNER, MD;  Location: [facility];  Service: Endoscopy;  Laterality: N/A;   SPHINCTEROTOMY N/A 11/24/2016   Procedure: SPHINCTEROTOMY;  Surgeon: Golda Claudis PENNER, MD;  Location: [facility];  Service: Endoscopy;  Laterality: N/A;   TISSUE AORTIC VALVE REPLACEMENT     2012    Family History  Problem Relation Age of Onset   Hearing loss Mother    Alzheimer's disease Mother    Hearing loss Father  Diabetes Father    Diabetes Other        Family History     Social History   Socioeconomic History   Marital status: Married    Spouse name: Jerel   Number of children: 3   Years of education: Not on file   Highest education level: High school graduate  Occupational History   Occupation: Retired  Tobacco Use   Smoking status: Former    Current packs/day: 0.00    Average packs/day: 1.5 packs/day for 28.0 years (42.0 ttl pk-yrs)    Types: Cigarettes    Start date: 07/06/1974    Quit date: 07/06/2002    Years since quitting: 21.7    Passive exposure: Never   Smokeless tobacco: Never  Vaping Use   Vaping  status: Never Used  Substance and Sexual Activity   Alcohol use: No    Alcohol/week: 0.0 standard drinks of alcohol   Drug use: No   Sexual activity: Not on file  Other Topics Concern   Not on file  Social History Narrative   Not on file   Social Drivers of Health   Financial Resource Strain: Low Risk  (12/06/2023)   Overall Financial Resource Strain (CARDIA)    Difficulty of Paying Living Expenses: Not hard at all  Food Insecurity: No Food Insecurity (12/31/2023)   Hunger Vital Sign    Worried About Running Out of Food in the Last Year: Never true    Ran Out of Food in the Last Year: Never true  Transportation Needs: No Transportation Needs (12/31/2023)   PRAPARE - Administrator, Civil Service (Medical): No    Lack of Transportation (Non-Medical): No  Physical Activity: Sufficiently Active (12/06/2023)   Exercise Vital Sign    Days of Exercise per Week: 5 days    Minutes of Exercise per Session: 60 min  Stress: No Stress Concern Present (12/06/2023)   [employer] of Occupational Health - Occupational Stress Questionnaire    Feeling of Stress : Not at all  Social Connections: Moderately Integrated (12/31/2023)   Social Connection and Isolation Panel    Frequency of Communication with Friends and Family: Twice a week    Frequency of Social Gatherings with Friends and Family: Twice a week    Attends Religious Services: More than 4 times per year    Active Member of [employer] or Organizations: No    Attends Banker Meetings: Never    Marital Status: Married  Catering manager Violence: Not At Risk (12/31/2023)   Humiliation, Afraid, Rape, and Kick questionnaire    Fear of Current or Ex-Partner: No    Emotionally Abused: No    Physically Abused: No    Sexually Abused: No     Physical Exam   Vitals:   03/21/24 0614  BP: (!) 168/84  Pulse: 84  Resp: 18  Temp: 98.2 F (36.8 C)  SpO2: 95%    CONSTITUTIONAL: Well-appearing, NAD NEURO/PSYCH:  Alert and  oriented x 3 normal and symmetric strength, decrease sensation to the left leg EYES:  eyes equal and reactive ENT/NECK:  no LAD, no JVD CARDIO: Regular rate, well-perfused, normal S1 and S2 PULM:  CTAB no wheezing or rhonchi GI/GU:  non-distended, non-tender MSK/SPINE:  No gross deformities, no edema SKIN:  no rash, atraumatic   *Additional and/or pertinent findings included in MDM below  Diagnostic and Interventional Summary    EKG Interpretation Date/Time:  March 21, 2024 at 06:24:48 Ventricular Rate:   80 PR  Interval:   177 QRS Duration:   88 QT Interval:   367 QTC Calculation:  424 R Axis:      Text Interpretation: Sinus rhythm       Labs Reviewed  CBC - Abnormal; Notable for the following components:      Result Value   RBC 4.12 (*)    All other components within normal limits  I-STAT CHEM 8, ED - Abnormal; Notable for the following components:   Creatinine, Ser 1.40 (*)    Glucose, Bld 199 (*)    All other components within normal limits  DIFFERENTIAL  ETHANOL  PROTIME-INR  APTT  COMPREHENSIVE METABOLIC PANEL WITH GFR  RAPID URINE DRUG SCREEN, HOSP PERFORMED  TROPONIN I (HIGH SENSITIVITY)    DG Chest Port 1 View  Final Result    CT Angio Chest/Abd/Pel for Dissection W and/or Wo Contrast    (Results Pending)  CT HEAD WO CONTRAST ( )    (Results Pending)    Medications  iohexol  (OMNIPAQUE ) 350 MG/ML injection 100 mL (has no administration in time range)     Procedures  /  Critical Care Procedures  ED Course and Medical Decision Making  Initial Impression and Ddx Patient presenting with decreased sensation to the left leg, no other neurological deficits.  Last known well 1 AM.  Was having some chest pain earlier and has a history of aortic aneurysm.  Dissection is considered.  Acute ischemic stroke is considered.  Past medical/surgical history that increases complexity of ED encounter: History of A-fib, CAD  Interpretation of Diagnostics I  personally reviewed the EKG and my interpretation is as follows: Sinus rhythm nonspecific findings  Labs, CT pending  Patient Reassessment and Ultimate Disposition/Management     Signed out to oncoming provider at shift change.  Patient management required discussion with the following services or consulting groups:  None  Complexity of Problems Addressed Acute illness or injury that poses threat of life of bodily function  Additional Data Reviewed and Analyzed Further history obtained from: Prior labs/imaging results  Additional Factors Impacting ED Encounter Risk Consideration of hospitalization  Ozell HERO. Theadore, MD [facility] Emergency Medicine [facility] [email or web address]  Final Clinical Impressions(s) / ED Diagnoses     ICD-10-CM   1. Hypertension, unspecified type  I10     2. Leg numbness  R20.0       ED Discharge Orders     None        Discharge Instructions Discussed with and Provided to Patient:   Discharge Instructions   None      Theadore Ozell HERO, MD 03/21/24 [phone]

## 2024-03-21 NOTE — ED Provider Notes (Signed)
 Pt was signed out by Dr. Theadore pending CT scans and labs  CT scans reviewed by me.  I agree with the radiologist.  CT chest/abd/pelvis: No acute findings in the chest, abdomen, or pelvis. Specifically,  no evidence for acute intramural hematoma or dissection of the  thoracoabdominal aorta.  2. 4.2 mm diameter ascending thoracic aortic aneurysm. Recommend  annual imaging followup by CTA or MRA. This recommendation follows  2010 ACCF/AHA/AATS/ACR/ASA/SCA/SCAI/SIR/STS/SVM Guidelines for the  Diagnosis and Management of Patients with Thoracic Aortic Disease.  Circulation. 2010; 121: [id number]. Aortic aneurysm NOS (ICD10-I71.9)  3. 4 mm nonobstructing right renal stone.  4. Avascular necrosis left femoral head without collapse.  5. Small left groin hernia contains only fat.  6. Aortic Atherosclerosis (ICD10-I70.0) and Emphysema (ICD10-J43.9).   CT head: No acute intracranial abnormality.   CBC nl; cmp nl other than glucose sl elevated at 195 and cr 1.31; trop nl, etoh neg  Pt's numbness is improving, but he still has some numbness to the left leg.  MRI brain ordered.  MRI brain reviewed by me.  I agree with the radiologist.   No acute intracranial abnormality.  2. Age-related cerebral volume loss and mild periventricular and subcortical  white matter disease.    Pt is feeling better.  He is stable for d/c.  Return if worse.  F/u with pcp.   Dean Clarity, MD 03/21/24 [phone]

## 2024-03-21 NOTE — ED Notes (Signed)
 Pt given a urinal and explained of need for urine sample

## 2024-03-21 NOTE — ED Notes (Signed)
 Patient transported to CT

## 2024-03-21 NOTE — ED Triage Notes (Addendum)
 Pt states that his BP was 185/87 at home. c/o numbness in both legs, shakiness and headache. Pt with hx of hypertension and has been taking medication.

## 2024-03-21 NOTE — ED Notes (Signed)
 Patient transported to MRI

## 2024-03-22 ENCOUNTER — Ambulatory Visit: Payer: Self-pay

## 2024-03-22 ENCOUNTER — Ambulatory Visit: Payer: Self-pay | Admitting: Family Medicine

## 2024-03-22 NOTE — Telephone Encounter (Signed)
 No answer and vmail not set up. Technically he should follow up with Dettinger since these symptoms are new. However, will make Dettinger aware to see what his recommendations are.

## 2024-03-22 NOTE — Telephone Encounter (Signed)
 Yes he should schedule a follow-up after going to the ER, usually within a week or 2.

## 2024-03-22 NOTE — Telephone Encounter (Signed)
 FYI Only or Action Required?: Action required by provider: clinical question for provider.  Patient was last seen in primary care on 03/16/2024 by Dettinger, Fonda LABOR, MD.  Called Nurse Triage for Results.  Triage Disposition: Call PCP When Office is Open  Patient/caregiver understands and will follow disposition?: Yes       Copied from CRM [id number]. Topic: Clinical - Lab/Test Results >> Mar 22, 2024  3:26 PM Rosaria BRAVO wrote: Reason for CRM: Lab results       Reason for Disposition  Caller requesting routine or non-urgent lab result  Answer Assessment - Initial Assessment Questions Lab results discussed with the patient per Dr. Williemae note. Patient also reports he was in the ED yesterday and wanted to know if he needed to schedule a follow up, stating he was seen in the office last week.     1. REASON FOR CALL or QUESTION: What is your reason for calling today? or How can I best     Lab results  2. CALLER: Document the source of call. (e.g., laboratory staff, caregiver or patient).     Patient  Protocols used: PCP Call - No Triage-A-AH

## 2024-03-28 ENCOUNTER — Ambulatory Visit (INDEPENDENT_AMBULATORY_CARE_PROVIDER_SITE_OTHER)

## 2024-03-28 ENCOUNTER — Telehealth: Payer: Self-pay

## 2024-03-28 ENCOUNTER — Ambulatory Visit

## 2024-03-28 DIAGNOSIS — I6523 Occlusion and stenosis of bilateral carotid arteries: Secondary | ICD-10-CM

## 2024-03-28 NOTE — Progress Notes (Signed)
 Care Guide Pharmacy Note  03/28/2024 Name: [name] MRN: [id number] DOB: [birth year]  Referred By: Maryanne Fonda LABOR, MD Reason for referral: Complex Care Management (Outreach to schedule with Pharm d )   [name] [name] is a 68 y.o. year old male who is a primary care patient of Dettinger, Fonda LABOR, MD.  Lynwood CHRISTELLA Ada was referred to the pharmacist for assistance related to: HTN and DMII  Successful contact was made with the patient to discuss pharmacy services including being ready for the pharmacist to call at least 5 minutes before the scheduled appointment time and to have medication bottles and any blood pressure readings ready for review. The patient agreed to meet with the pharmacist via telephone visit on (date/time).04/19/2024  Jeoffrey Buffalo , RMA     [place]  [facility] Guide  Direct Dial: [phone]  Website: [email or web address]

## 2024-03-29 ENCOUNTER — Other Ambulatory Visit: Payer: Self-pay | Admitting: Urology

## 2024-03-29 ENCOUNTER — Telehealth: Payer: Self-pay

## 2024-03-29 DIAGNOSIS — N201 Calculus of ureter: Secondary | ICD-10-CM | POA: Diagnosis not present

## 2024-03-29 NOTE — Telephone Encounter (Signed)
 Patient called the office and was transferred for an appt. He was transferred to me and advised he thinks  he had a kidney stone. I made him aware our office requires a referral. Even hough he was seen in 2021. I made him aware that [facility] was booking into November but [facility] could see him sooner. He advised he would like to go to [facility] then. I made him aware that they were a private practice but I could provide the number. I provided the number to contact for an appt.

## 2024-03-30 ENCOUNTER — Encounter (HOSPITAL_COMMUNITY): Payer: Self-pay | Admitting: Urology

## 2024-03-30 ENCOUNTER — Other Ambulatory Visit: Payer: Self-pay

## 2024-03-30 NOTE — Progress Notes (Signed)
 Anesthesia Chart Review   Case: [id number] Date/Time: 03/31/24 1200   Procedures:      CYSTOSCOPY/URETEROSCOPY/HOLMIUM LASER/STENT PLACEMENT (Right)     CYSTOSCOPY, WITH RETROGRADE PYELOGRAM (Right)   Anesthesia type: General   Diagnosis: Calculus of ureter [N20.1]   Pre-op diagnosis: RIGHT URETERAL STONE   Location: [facility]   Surgeons: Shane Steffan BROCKS, MD       DISCUSSION:67 y.o. former smoker with h/o OSA on CPAP, HTN, DM II, h/o AVR 2012, ascending aortic aneurysm stable, atrial fibrillation, CKD Stage III, AAA, right ureteral stone scheduled for above procedure 03/31/2024 with Dr. Steffan Shane.   Pt last seen by cardiology 03/13/2024. BP at goal. Recent echo with normal AV function. Pt experienced an episode of syncope, per note likely orthostatic syncope, no further workup. Pt reports he can exercise without chest pain or shortness of breath.   VS: Ht 5' 10 (1.778 m)   Wt 86.2 kg   BMI 27.26 kg/m   PROVIDERS: Dettinger, Fonda LABOR, MD is PCP   Alvan Carrier, MD is Cardiologist  LABS: Labs reviewed: Acceptable for surgery. (all labs ordered are listed, but only abnormal results are displayed)  Labs Reviewed - No data to display   IMAGES: VAS US  Carotid 03/28/24 Summary:  Right Carotid: Velocities in the right ICA are consistent with a 60-79%                 stenosis, low end of range.   Left Carotid: Velocities in the left ICA are consistent with a 1-39%  stenosis.   Vertebrals: Bilateral vertebral arteries demonstrate antegrade flow.  Subclavians: Normal flow hemodynamics were seen in bilateral subclavian               arteries.   EKG:   CV: Echo 01/01/2024 1. Left ventricular ejection fraction, by estimation, is 60 to 65%. The  left ventricle has normal function. The left ventricle has no regional  wall motion abnormalities. Left ventricular diastolic parameters are  consistent with Grade I diastolic  dysfunction (impaired  relaxation).   2. Right ventricular systolic function is normal. The right ventricular  size is normal. Tricuspid regurgitation signal is inadequate for assessing  PA pressure.   3. Left atrial size was mildly dilated.   4. The mitral valve is normal in structure. No evidence of mitral valve  regurgitation.   5. The aortic valve has been repaired/replaced. Aortic valve  regurgitation is not visualized. No aortic stenosis is present. There is a  25 mm Magna valve present in the aortic position. Aortic valve mean  gradient measures 10.0 mmHg. Aortic valve Vmax  measures 2.22 m/s. Aortic valve acceleration time measures 92 msec.  Past Medical History:  Diagnosis Date   Aortic stenosis 02/2008   Bicuspid aortic valve   Arthritis 12/2008   hand    Ascending aortic aneurysm    4.1 to 4.2 cm by last MRI 10/2013   Atrial fibrillation (HCC)    Bile duct calculus with acute cholecystitis    CAD (coronary artery disease) 02/2008   Calculus of common bile duct with acute pancreatitis 11/2016   Carotid stenosis    CKD (chronic kidney disease), stage III (HCC)    CTS (carpal tunnel syndrome)    bilateral    Diabetes (HCC)    Diastolic dysfunction 02/2008   Elevated LFTs    Fatty liver disease, nonalcoholic    History of kidney stones    Hyperlipidemia    Hypertension  Insomnia    Kidney stones    Nasal congestion    NIDDM (non-insulin  dependent diabetes mellitus)    x15 yrs   Numbness and tingling of both legs    OSA on CPAP 2006   Paresthesias in left hand 02/19/2004   Paresthesias in right hand 02/19/2004   Pulmonary nodules    Sinusitis    Syncope    Vitamin D  deficiency 06/13/2009    Past Surgical History:  Procedure Laterality Date   bladder cancer - operation - 01/21/18 - Dr Alvaro  01/21/2018   CARPAL TUNNEL RELEASE     bilateral    CHOLECYSTECTOMY  11/2016   COLONOSCOPY N/A 09/22/2018   Procedure: COLONOSCOPY;  Surgeon: Golda Claudis PENNER, MD;  Location: [facility];   Service: Endoscopy;  Laterality: N/A;  1:00   CORONARY ANGIOPLASTY WITH STENT PLACEMENT     to rt coronary atery (Dr. Degert-cardiologist)   CYSTOSCOPY WITH RETROGRADE PYELOGRAM, URETEROSCOPY AND STENT PLACEMENT Left 03/07/2018   Procedure: CYSTOSCOPY WITH RETROGRADE PYELOGRAM,  AND STENT PLACEMENT;  Surgeon: Alvaro Hummer, MD;  Location: [facility];  Service: Urology;  Laterality: Left;   ERCP N/A 11/24/2016   Procedure: ENDOSCOPIC RETROGRADE CHOLANGIOPANCREATOGRAPHY (ERCP);  Surgeon: Golda Claudis PENNER, MD;  [facility];  Service: Endoscopy;  Laterality: N/A;   EXTRACORPOREAL SHOCK WAVE LITHOTRIPSY Left 03/17/2018   Procedure: LEFT EXTRACORPOREAL SHOCK WAVE LITHOTRIPSY (ESWL);  Surgeon: Watt Rush, MD;  Location: [facility];  Service: Urology;  Laterality: Left;   LOOP RECORDER INSERTION N/A 12/31/2023   Procedure: LOOP RECORDER INSERTION;  Surgeon: Nancey Eulas BRAVO, MD;  Location: [facility];  Service: Cardiovascular;  Laterality: N/A;   REMOVAL OF STONES N/A 11/24/2016   Procedure: REMOVAL OF STONES;  Surgeon: Golda Claudis PENNER, MD;  Location[facility];  Service: Endoscopy;  Laterality: N/A;   SPHINCTEROTOMY N/A 11/24/2016   Procedure: SPHINCTEROTOMY;  Surgeon: Golda Claudis PENNER, MD;  Location[facility];  Service: Endoscopy;  Laterality: N/A;   TISSUE AORTIC VALVE REPLACEMENT     2012    MEDICATIONS: No current facility-administered medications for this encounter.    acetaminophen  (TYLENOL ) 325 MG tablet   amLODipine  (NORVASC ) 10 MG tablet   aspirin  EC 81 MG tablet   atorvastatin  (LIPITOR) 40 MG tablet   Cholecalciferol (VITAMIN D3) 50 MCG (2000 UT) TABS   diclofenac  Sodium (VOLTAREN ) 1 % GEL   glipiZIDE  (GLUCOTROL ) 5 MG tablet   insulin  aspart (NOVOLOG  FLEXPEN) 100 UNIT/ML FlexPen   insulin  degludec (TRESIBA  FLEXTOUCH) 100 UNIT/ML FlexTouch Pen   metFORMIN  (GLUCOPHAGE ) 1000 MG tablet   oxyCODONE  (OXY IR/ROXICODONE ) 5 MG immediate release tablet   polyethylene  glycol (MIRALAX  / GLYCOLAX ) 17 g packet   Probiotic Product (ALIGN PO)   blood glucose meter kit and supplies KIT   Continuous Glucose Receiver (FREESTYLE LIBRE 3 READER) DEVI   Continuous Glucose Sensor (FREESTYLE LIBRE 3 PLUS SENSOR) MISC   Insulin  Pen Needle (NOVOFINE PEN NEEDLE) 32G X 6 MM MISC   Insulin  Pen Needle (TRUEPLUS 5-BEVEL PEN NEEDLES) 31G X 6 MM MISC   Lancets (ONETOUCH DELICA PLUS LANCET33G) MISC   ONETOUCH ULTRA test strip    [employer], PA-C [facility] [phone]

## 2024-03-30 NOTE — Progress Notes (Addendum)
 For Anesthesia: PCP - Fonda DELENA Levins, MD last office visit note 03/16/24 in [facility] Cardiologist - Alvan, Dorn FALCON, MD last office visit note 03/13/24 in [facility]   Bowel Prep reminder: N/A  Chest x-ray - 03/21/24 in [facility] EKG - 03/21/24 in [facility] Stress Test - N/A ECHO - 01/01/2024 in [facility] Cardiac Cath - 08/2010 Pacemaker/ICD (LOOP Recorder) device last checked: 03/04/24 in [facility] Pacemaker orders received: N/A Device Rep notified: N/A  Spinal Cord Stimulator:N/A  Sleep Study - Yes CPAP - No  Fasting Blood Sugar - 130-150 Checks Blood Sugar- Freestyle Libre  Date and result of last Hgb A1c- 03/16/24 7.8 in [facility]  Last dose of GLP1 agonist- N/A GLP1 instructions: Hold 7 days prior to schedule (Hold 24 hours-daily)   Last dose of SGLT-2 inhibitors- N/A SGLT-2 instructions: Hold 72 hours prior to surgery  Blood Thinner Instructions: N/A Last Dose:N/A Time last taken:N/A  Aspirin  Instructions: will continue Last Dose: Time last taken:  Activity level: Able to exercise without chest pain and/or shortness of breath       Anesthesia review: s/p bioprosthetic AVR in 2012, Ascending aortic aneurysm 4.1 to 4.2 cm by last MRI 10/2013    Patient denies shortness of breath, fever, cough and chest pain at PAT appointment   Patient verbalized understanding of instructions that were reviewed over the telephone.

## 2024-03-30 NOTE — Patient Instructions (Addendum)
 SURGICAL WAITING ROOM VISITATION  Patients having surgery or a procedure may have no more than 2 support people in the waiting area - these visitors may rotate.    Children under the age of 27 must have an adult with them who is not the patient.  Visitors with respiratory illnesses are discouraged from visiting and should remain at home.  If the patient needs to stay at the hospital during part of their recovery, the visitor guidelines for inpatient rooms apply. Pre-op nurse will coordinate an appropriate time for 1 support person to accompany patient in pre-op.  This support person may not rotate.    Please refer to the [facility] website for the visitor guidelines for Inpatients (after your surgery is over and you are in a regular room).       Your procedure is scheduled on: Tomorrow, Friday, Sept. 26, 2025   Report to [facility] Main Entrance    Report to admitting at 10:00 AM   Call this number if you have problems the morning of surgery [phone]   Do not eat food or drink:After Midnight.    Oral Hygiene is also important to reduce your risk of infection.                                    Remember - BRUSH YOUR TEETH THE MORNING OF SURGERY WITH YOUR REGULAR TOOTHPASTE  DENTURES WILL BE REMOVED PRIOR TO SURGERY PLEASE DO NOT APPLY Poly grip OR ADHESIVES!!!   Do NOT smoke after Midnight   Stop all vitamins and herbal supplements 7 days before surgery.   Take these medicines the morning of surgery with A SIP OF WATER :  Amlodipine  Aspirin   DO NOT TAKE THE EVENING DOSE OF GLIPIZIDE  TONIGHT ON 03/30/24 NOR IN THE MORNING OF SURGERY 03/31/24.  TRESIBA  ONLY TAKE (1/2) HALF NORMAL DOSE THE MORNING OF SURGERY 03/31/24  DO NOT TAKE NOVOLOG  THE MORNING OF SURGERY 03/31/24 UNLESS BLOOD SUGAR IS GREATER THAN 220, THEN YOU CAN TAKE (1/2) HALF NORMAL DOSE  DO NOT TAKE ANY ORAL DIABETIC MEDICATIONS DAY OF YOUR SURGERY                              You may not have any  metal on your body including jewelry, and body piercing             Do not wear lotions, powders, perfumes/cologne, or deodorant             Men may shave face and neck.   Do not bring valuables to the hospital. [facility] IS NOT             RESPONSIBLE   FOR VALUABLES.   Contacts, glasses, dentures or bridgework may not be worn into surgery.   Bring small overnight bag day of surgery.   DO NOT BRING YOUR HOME MEDICATIONS TO THE HOSPITAL. PHARMACY WILL DISPENSE MEDICATIONS LISTED ON YOUR MEDICATION LIST TO YOU DURING YOUR ADMISSION IN THE HOSPITAL!    Patients discharged on the day of surgery will not be allowed to drive home.  Someone NEEDS to stay with you for the first 24 hours after anesthesia.   Special Instructions: Bring a copy of your healthcare power of attorney and living will documents the day of surgery if you haven't scanned them before.  Please read over the following fact sheets you were given: IF YOU HAVE QUESTIONS ABOUT YOUR PRE-OP INSTRUCTIONS PLEASE CALL [phone].   If you received a COVID test during your pre-op visit  it is requested that you wear a mask when out in public, stay away from anyone that may not be feeling well and notify your surgeon if you develop symptoms. If you test positive for Covid or have been in contact with anyone that has tested positive in the last 10 days please notify you surgeon.

## 2024-03-31 ENCOUNTER — Encounter (HOSPITAL_COMMUNITY): Payer: Self-pay | Admitting: Urology

## 2024-03-31 ENCOUNTER — Ambulatory Visit (HOSPITAL_COMMUNITY)
Admission: RE | Admit: 2024-03-31 | Discharge: 2024-03-31 | Disposition: A | Discharge status: Home / Self Care | Attending: Urology | Admitting: Urology

## 2024-03-31 ENCOUNTER — Ambulatory Visit (HOSPITAL_BASED_OUTPATIENT_CLINIC_OR_DEPARTMENT_OTHER): Payer: Self-pay | Admitting: Physician Assistant

## 2024-03-31 ENCOUNTER — Ambulatory Visit (HOSPITAL_COMMUNITY)

## 2024-03-31 ENCOUNTER — Encounter (HOSPITAL_COMMUNITY)
Admission: RE | Disposition: A | Discharge status: Home / Self Care | Payer: Self-pay | Source: Home / Self Care | Attending: Urology

## 2024-03-31 ENCOUNTER — Encounter (HOSPITAL_COMMUNITY): Payer: Self-pay | Admitting: Physician Assistant

## 2024-03-31 DIAGNOSIS — N132 Hydronephrosis with renal and ureteral calculous obstruction: Secondary | ICD-10-CM | POA: Insufficient documentation

## 2024-03-31 DIAGNOSIS — E785 Hyperlipidemia, unspecified: Secondary | ICD-10-CM | POA: Insufficient documentation

## 2024-03-31 DIAGNOSIS — Z87891 Personal history of nicotine dependence: Secondary | ICD-10-CM | POA: Diagnosis not present

## 2024-03-31 DIAGNOSIS — G4733 Obstructive sleep apnea (adult) (pediatric): Secondary | ICD-10-CM | POA: Insufficient documentation

## 2024-03-31 DIAGNOSIS — E1122 Type 2 diabetes mellitus with diabetic chronic kidney disease: Secondary | ICD-10-CM | POA: Diagnosis not present

## 2024-03-31 DIAGNOSIS — N183 Chronic kidney disease, stage 3 unspecified: Secondary | ICD-10-CM | POA: Insufficient documentation

## 2024-03-31 DIAGNOSIS — M1990 Unspecified osteoarthritis, unspecified site: Secondary | ICD-10-CM | POA: Diagnosis not present

## 2024-03-31 DIAGNOSIS — K219 Gastro-esophageal reflux disease without esophagitis: Secondary | ICD-10-CM | POA: Diagnosis not present

## 2024-03-31 DIAGNOSIS — Z794 Long term (current) use of insulin: Secondary | ICD-10-CM | POA: Diagnosis not present

## 2024-03-31 DIAGNOSIS — Z953 Presence of xenogenic heart valve: Secondary | ICD-10-CM | POA: Insufficient documentation

## 2024-03-31 DIAGNOSIS — I2510 Atherosclerotic heart disease of native coronary artery without angina pectoris: Secondary | ICD-10-CM

## 2024-03-31 DIAGNOSIS — I129 Hypertensive chronic kidney disease with stage 1 through stage 4 chronic kidney disease, or unspecified chronic kidney disease: Secondary | ICD-10-CM | POA: Diagnosis not present

## 2024-03-31 DIAGNOSIS — E1169 Type 2 diabetes mellitus with other specified complication: Secondary | ICD-10-CM

## 2024-03-31 DIAGNOSIS — G4730 Sleep apnea, unspecified: Secondary | ICD-10-CM | POA: Insufficient documentation

## 2024-03-31 DIAGNOSIS — I10 Essential (primary) hypertension: Secondary | ICD-10-CM

## 2024-03-31 DIAGNOSIS — N201 Calculus of ureter: Secondary | ICD-10-CM

## 2024-03-31 DIAGNOSIS — Z955 Presence of coronary angioplasty implant and graft: Secondary | ICD-10-CM | POA: Insufficient documentation

## 2024-03-31 DIAGNOSIS — Z7984 Long term (current) use of oral hypoglycemic drugs: Secondary | ICD-10-CM | POA: Insufficient documentation

## 2024-03-31 HISTORY — DX: Insomnia, unspecified: G47.00

## 2024-03-31 HISTORY — PX: CYSTOSCOPY/URETEROSCOPY/HOLMIUM LASER/STENT PLACEMENT: SHX6546

## 2024-03-31 HISTORY — DX: Occlusion and stenosis of unspecified carotid artery: I65.29

## 2024-03-31 HISTORY — PX: CYSTOSCOPY W/ RETROGRADES: SHX1426

## 2024-03-31 HISTORY — DX: Aneurysm of the ascending aorta, without rupture: I71.21

## 2024-03-31 HISTORY — DX: Other nonspecific abnormal finding of lung field: R91.8

## 2024-03-31 HISTORY — DX: Chronic kidney disease, stage 3 unspecified: N18.30

## 2024-03-31 HISTORY — DX: Anesthesia of skin: R20.0

## 2024-03-31 HISTORY — DX: Syncope and collapse: R55

## 2024-03-31 LAB — GLUCOSE, CAPILLARY
Glucose-Capillary: 152 mg/dL — ABNORMAL HIGH (ref 70–99)
Glucose-Capillary: 139 mg/dL — ABNORMAL HIGH (ref 70–99)
Glucose-Capillary: 144 mg/dL — ABNORMAL HIGH (ref 70–99)

## 2024-03-31 SURGERY — CYSTOSCOPY/URETEROSCOPY/HOLMIUM LASER/STENT PLACEMENT
Anesthesia: General | Site: Ureter | Laterality: Right

## 2024-03-31 MED ORDER — PHENYLEPHRINE 80 MCG/ML (10ML) SYRINGE FOR IV PUSH (FOR BLOOD PRESSURE SUPPORT)
PREFILLED_SYRINGE | INTRAVENOUS | Status: DC | PRN
  Administered 2024-03-31: 80 ug via INTRAVENOUS

## 2024-03-31 MED ORDER — CHLORHEXIDINE GLUCONATE 0.12 % MT SOLN
15.0000 mL | Freq: Once | OROMUCOSAL | Status: AC
  Administered 2024-03-31: 15 mL via OROMUCOSAL

## 2024-03-31 MED ORDER — FENTANYL CITRATE (PF) 100 MCG/2ML IJ SOLN
INTRAMUSCULAR | Status: DC | PRN
  Administered 2024-03-31: 50 ug via INTRAVENOUS

## 2024-03-31 MED ORDER — MIDAZOLAM HCL 2 MG/2ML IJ SOLN
INTRAMUSCULAR | Status: AC
  Filled 2024-03-31: qty 2

## 2024-03-31 MED ORDER — PROPOFOL 10 MG/ML IV BOLUS
INTRAVENOUS | Status: DC | PRN
  Administered 2024-03-31: 150 mg via INTRAVENOUS

## 2024-03-31 MED ORDER — DEXAMETHASONE SODIUM PHOSPHATE 10 MG/ML IJ SOLN
INTRAMUSCULAR | Status: DC | PRN
  Administered 2024-03-31: 5 mg via INTRAVENOUS

## 2024-03-31 MED ORDER — SODIUM CHLORIDE 0.9 % IV SOLN
1.0000 g | Freq: Once | INTRAVENOUS | Status: AC
  Administered 2024-03-31: 2 g via INTRAVENOUS
  Filled 2024-03-31: qty 10

## 2024-03-31 MED ORDER — LIDOCAINE HCL (PF) 2 % IJ SOLN
INTRAMUSCULAR | Status: AC
  Filled 2024-03-31: qty 5

## 2024-03-31 MED ORDER — ORAL CARE MOUTH RINSE: 15.0000 mL | Freq: Once | OROMUCOSAL | Status: AC

## 2024-03-31 MED ORDER — FENTANYL CITRATE (PF) 100 MCG/2ML IJ SOLN
INTRAMUSCULAR | Status: AC
  Filled 2024-03-31: qty 2

## 2024-03-31 MED ORDER — MIDAZOLAM HCL 5 MG/5ML IJ SOLN
INTRAMUSCULAR | Status: DC | PRN
  Administered 2024-03-31: 2 mg via INTRAVENOUS

## 2024-03-31 MED ORDER — ONDANSETRON HCL 4 MG/2ML IJ SOLN
INTRAMUSCULAR | Status: DC | PRN
  Administered 2024-03-31: 4 mg via INTRAVENOUS

## 2024-03-31 MED ORDER — LACTATED RINGERS IV SOLN: INTRAVENOUS | Status: DC

## 2024-03-31 MED ORDER — HYDROMORPHONE HCL 1 MG/ML IJ SOLN: 0.2500 mg | INTRAMUSCULAR | Status: DC | PRN

## 2024-03-31 MED ORDER — ONDANSETRON HCL 4 MG/2ML IJ SOLN: 4.0000 mg | Freq: Once | INTRAMUSCULAR | Status: DC | PRN

## 2024-03-31 MED ORDER — DEXAMETHASONE SODIUM PHOSPHATE 10 MG/ML IJ SOLN
INTRAMUSCULAR | Status: AC
  Filled 2024-03-31: qty 1

## 2024-03-31 MED ORDER — ONDANSETRON HCL 4 MG/2ML IJ SOLN
INTRAMUSCULAR | Status: AC
  Filled 2024-03-31: qty 2

## 2024-03-31 MED ORDER — SODIUM CHLORIDE (PF) 0.9 % IJ SOLN
INTRAMUSCULAR | Status: AC
  Filled 2024-03-31: qty 50

## 2024-03-31 MED ORDER — OXYCODONE HCL 5 MG/5ML PO SOLN: 5.0000 mg | Freq: Once | ORAL | Status: DC | PRN

## 2024-03-31 MED ORDER — LIDOCAINE HCL (CARDIAC) PF 100 MG/5ML IV SOSY
PREFILLED_SYRINGE | INTRAVENOUS | Status: DC | PRN
  Administered 2024-03-31: 60 mg via INTRAVENOUS

## 2024-03-31 MED ORDER — ACETAMINOPHEN 500 MG PO TABS
1000.0000 mg | ORAL_TABLET | Freq: Once | ORAL | Status: AC
  Administered 2024-03-31: 1000 mg via ORAL
  Filled 2024-03-31: qty 2

## 2024-03-31 MED ORDER — OXYCODONE HCL 5 MG PO TABS: 5.0000 mg | ORAL_TABLET | Freq: Once | ORAL | Status: DC | PRN

## 2024-03-31 MED ORDER — PHENYLEPHRINE HCL (PRESSORS) 10 MG/ML IV SOLN
INTRAVENOUS | Status: DC | PRN
  Administered 2024-03-31: 80 ug via INTRAVENOUS

## 2024-03-31 MED ORDER — INSULIN ASPART 100 UNIT/ML IJ SOLN
0.0000 [IU] | INTRAMUSCULAR | Status: AC | PRN
  Administered 2024-03-31 (×2): 2 [IU] via SUBCUTANEOUS
  Filled 2024-03-31: qty 1

## 2024-03-31 MED ORDER — AMISULPRIDE (ANTIEMETIC) 5 MG/2ML IV SOLN: 10.0000 mg | Freq: Once | INTRAVENOUS | Status: DC | PRN

## 2024-03-31 SURGICAL SUPPLY — 22 items
BAG COUNTER SPONGE SURGICOUNT (BAG) IMPLANT
BAG URO CATCHER STRL LF (MISCELLANEOUS) ×2 IMPLANT
BASKET ZERO TIP NITINOL 2.4FR (BASKET) IMPLANT
CATH URETERAL DUAL LUMEN 10F (MISCELLANEOUS) IMPLANT
CATH URETL OPEN 5X70 (CATHETERS) IMPLANT
CLOTH BEACON ORANGE TIMEOUT ST (SAFETY) ×2 IMPLANT
EXTRACTOR STONE 1.7FRX115CM (UROLOGICAL SUPPLIES) IMPLANT
FIBER LASER MOSES 200 DFL (Laser) IMPLANT
FIBER LASER MOSES 365 DFL (Laser) IMPLANT
GLOVE BIO SURGEON STRL SZ8 (GLOVE) ×2 IMPLANT
GOWN STRL REUS W/ TWL XL LVL3 (GOWN DISPOSABLE) ×2 IMPLANT
GUIDEWIRE ANG ZIPWIRE 038X150 (WIRE) IMPLANT
GUIDEWIRE STR DUAL SENSOR (WIRE) ×2 IMPLANT
KIT TURNOVER KIT A (KITS) ×2 IMPLANT
MANIFOLD NEPTUNE II (INSTRUMENTS) ×2 IMPLANT
NS IRRIG 1000ML POUR BTL (IV SOLUTION) IMPLANT
PACK CYSTO (CUSTOM PROCEDURE TRAY) ×2 IMPLANT
SHEATH NAVIGATOR HD 11/13X28 (SHEATH) IMPLANT
SHEATH NAVIGATOR HD 11/13X36 (SHEATH) ×2 IMPLANT
TRACTIP FLEXIVA PULS ID 200XHI (Laser) IMPLANT
TUBING CONNECTING 10 (TUBING) ×2 IMPLANT
TUBING UROLOGY SET (TUBING) ×2 IMPLANT

## 2024-03-31 NOTE — Anesthesia Preprocedure Evaluation (Addendum)
 Anesthesia Evaluation  Patient identified by MRN, date of birth, ID band Patient awake    Reviewed: Allergy & Precautions, NPO status , Patient's Chart, lab work & pertinent test results  Airway Mallampati: III  TM Distance: >3 FB Neck ROM: Full    Dental no notable dental hx. (+) Teeth Intact, Dental Advisory Given   Pulmonary sleep apnea (remote hx used cpap) , former smoker Quit smoking 2004, 42 pack year history    Pulmonary exam normal breath sounds clear to auscultation       Cardiovascular hypertension (144/77 preop), Pt. on medications + CAD and + Cardiac Stents (2009)  Normal cardiovascular exam+ dysrhythmias (no a/c) Atrial Fibrillation + Valvular Problems/Murmurs (2013 AVR)  Rhythm:Regular Rate:Normal     Neuro/Psych CVA, No Residual Symptoms  negative psych ROS   GI/Hepatic Neg liver ROS,GERD  Controlled,,  Endo/Other  diabetes, Well Controlled, Type 2, Insulin  Dependent  Last A1c 7.8 FS 152 in preop, 153 on dexcom Starts feeling symptomatic around 80   Renal/GU Renal InsufficiencyRenal disease (cr 1.4)R ureteral stone  negative genitourinary   Musculoskeletal  (+) Arthritis , Osteoarthritis,    Abdominal   Peds  Hematology negative hematology ROS (+)   Anesthesia Other Findings   Reproductive/Obstetrics negative OB ROS                              Anesthesia Physical Anesthesia Plan  ASA: 3  Anesthesia Plan: General   Post-op Pain Management: Tylenol  PO (pre-op)*   Induction: Intravenous  PONV Risk Score and Plan: 2 and Ondansetron , Dexamethasone , Midazolam  and Treatment may vary due to age or medical condition  Airway Management Planned: LMA  Additional Equipment: None  Intra-op Plan:   Post-operative Plan: Extubation in OR  Informed Consent: I have reviewed the patients History and Physical, chart, labs and discussed the procedure including the risks,  benefits and alternatives for the proposed anesthesia with the patient or authorized representative who has indicated his/her understanding and acceptance.     Dental advisory given  Plan Discussed with: CRNA  Anesthesia Plan Comments:          Anesthesia Quick Evaluation

## 2024-03-31 NOTE — H&P (Signed)
 68 year old male with a history of nephrolithiasis he has had ESWL twice and required stenting in the OR multiple times. Presents today for concerns of nephrolithiasis. Patient had been seen since 2023.   PMH: Denies cardiac history, denies pulmonary history  PSH: Patient has had ESWL and ureteroscopy per patient   Nephrolithiasis:  03/25/2024: UA shows blood. Patient started to have pain last night and Friday night. Having a lot a of urgency. GH last night, Pt has had some nausea. Pt has not eaten today. Pt has never been able to pass stone.  9/26/25L Right URS w/ LL today     ALLERGIES: Hydro-chlorthiazide Sulfa Drugs    MEDICATIONS: amLODIPine  Besylate 10 MG Tablet  Aspirin  81  Atorvastatin  Calcium  40 MG Tablet  glipiZIDE  5 MG Tablet 1 Oral Daily  MetFORMIN  HCl - 500 MG Oral Tablet 2 Oral  Vitamin D  TABS Oral     GU PSH: Cysto Remove Stent FB Sim - 2019 Cystoscopy Insert Stent, Left - 2019, 2011 ESWL, Left - 2019, 2016 Ureteroscopic stone removal - 2011       PSH Notes: Neuroplasty Median Nerve At Carpal Tunnel, Foot Surgery, Renal Lithotripsy, Heart Valve Replacement, Cystoscopy With Ureteroscopy With Removal Of Calculus, Cystoscopy With Insertion Of Ureteral Stent Left   NON-GU PSH: Carpal Tunnel Surgery.. - 2016     GU PMH: History of urolithiasis (Stable), No evident renal calculi remaining - 05/27/2022, No evidence of stones today, - 2021 Primary hypogonadism, On Rx elsewhere--doing well - 2021, - 2017, Low testosterone  level in male, - 2017 Nocturia - 2020 Renal calculus (Improving), Left - 2019, (Stable), Stable LLP stone, - 2019, - 2018, - 2017, Nephrolithiasis, - 2017 Other microscopic hematuria, Microscopic hematuria - 2017 ED due to arterial insufficiency, Erectile dysfunction due to arterial insufficiency - 2016 Abdominal Pain Unspec, Right flank pain - 2015 Flank Pain, Generalized abdominal pain - 2014 Ureteral calculus, Calculus of ureter - 2014    NON-GU  PMH: Anxiety, Anxiety - 2016 Personal history of other diseases of the circulatory system, History of cardiac disorder - 2016, History of hypertension, - 2016, History of cardiac murmur, - 2016, History of aortic aneurysm, - 2015 Personal history of other diseases of the digestive system, History of esophageal reflux - 2016 Personal history of other diseases of the musculoskeletal system and connective tissue, History of arthritis - 2016 Personal history of other diseases of the nervous system and sense organs, History of sleep apnea - 2016 Personal history of other endocrine, nutritional and metabolic disease, History of hypercholesterolemia - 2016, History of diabetes mellitus, - 2016 Encounter for general adult medical examination without abnormal findings, Encounter for preventive health examination - 2015 Nausea, Nausea - 2015    FAMILY HISTORY: cardiac disorder - Runs In Family Death of family member - Runs In Family Hypertension - Runs In Family Recurrent nephrolithiasis - Runs In Family   SOCIAL HISTORY: Marital Status: Married Preferred Language: English; Ethnicity: Not Hispanic Or Latino; Race: White Current Smoking Status: Patient does not smoke anymore. Smoked for 25 years.   Tobacco Use Assessment Completed: Used Tobacco in last 30 days? Does not use smokeless tobacco. Has never drank.  Does not use drugs. Drinks 2 caffeinated drinks per day. Has not had a blood transfusion.     Notes: Married, Retired, Caffeine use, Former smoker   REVIEW OF SYSTEMS:    GU Review Male:   Patient denies frequent urination, hard to postpone urination, burning/ pain with urination, get up at night to  urinate, leakage of urine, stream starts and stops, trouble starting your stream, have to strain to urinate , erection problems, and penile pain.  Gastrointestinal (Upper):   Patient denies nausea, vomiting, and indigestion/ heartburn.  Gastrointestinal (Lower):   Patient denies diarrhea and  constipation.  Constitutional:   Patient denies fever, night sweats, weight loss, and fatigue.  Skin:   Patient denies skin rash/ lesion and itching.  Eyes:   Patient denies blurred vision and double vision.  Ears/ Nose/ Throat:   Patient denies sore throat and sinus problems.  Hematologic/Lymphatic:   Patient denies swollen glands and easy bruising.  Cardiovascular:   Patient denies leg swelling and chest pains.  Respiratory:   Patient denies cough and shortness of breath.  Endocrine:   Patient denies excessive thirst.  Musculoskeletal:   Patient denies joint pain and back pain.  Neurological:   Patient denies headaches and dizziness.  Psychologic:   Patient denies depression and anxiety.   VITAL SIGNS: None   Complexity of Data:  Source Of History:  Patient  Records Review:   Previous Patient Records  Urine Test Review:   Urinalysis  X-Ray Review: C.T. Abdomen/Pelvis: Reviewed Films. Discussed With Patient. Right distal ureteral stone 3 to 4 mm mild right hydronephrosis, no left hydronephrosis no stones on the left side. No abnormalities of the bladder.   Notes:                     Patient doing this is also a 26 extra antistress surgery so I do not see he understands he can still prescribe epistaxis here   PROCEDURES:         C.T. Urogram - [id number]      Patient confirmed No Neulasta OnPro Device.         Visit Complexity - G2211          Urinalysis w/Scope - 81001 Dipstick Dipstick Cont'd Micro  Color: Yellow Bilirubin: Neg WBC/hpf: 0 - 5/hpf  Appearance: Slightly Cloudy Ketones: Neg RBC/hpf: >60/hpf  Specific Gravity: 1.025 Blood: 3+ Bacteria: Mod (26-50/hpf)  pH: <=5.0 Protein: 2+ Cystals: NS (Not Seen)  Glucose: Neg Urobilinogen: 0.2 Casts: NS (Not Seen)    Nitrites: Neg Trichomonas: Not Present    Leukocyte Esterase: Neg Mucous: Present      Epithelial Cells: 0 - 5/hpf      Yeast: NS (Not Seen)      Sperm: Not Present    Notes:      ASSESSMENT:      ICD-10  Details  1 GU:   Ureteral calculus - N20.1 Undiagnosed New Problem     PLAN:            Medications New Meds: Rapaflo 8 MG Capsule 1 capsule PO Q HS   #30  0 Refill(s)  Methocarbamol 750 MG Tablet 2 tablet PO TID   #60  0 Refill(s)  Ondansetron  8 MG Tablet Disintegrating 1 tablet Sublingual Q 8 H PRN for nausea  #14  0 Refill(s)  oxyCODONE  HCl 5 MG Tablet 1 tablet PO Q 6 H   #10  0 Refill(s)  Pharmacy Name:  [facility]  Address:  [address]   [place], [place] [id number]  Phone:  [phone]  Fax:  [phone]            Orders Labs BUN/Creatinine(Stat)  Lab Notes: STAT  X-Rays: C.T. Stone Protocol Without I.V. Contrast  X-Ray Notes: History:   Hematuria: Yes / No  Patient to see MD after exam: Yes/ No   Previous exam:   When:   Where:   Diabetic: Yes / No   BUN/ Creatinine:   Date of last BUN Creatinine:   Weight in pounds:   Allergy- IV Contrast: Yes/ No  Prior Authorization #: HTA: NPCR            Schedule         Document Letter(s):  Created for Patient: Clinical Summary         Notes:   Right nephrolithiasis: Patient has had minimal nausea pain adequately managed. Creatinine is at baseline of creatinine 1.6 is at baseline of creatinines of 1.4-1.5. Patient has a small stone recommended trial of passage patient is very hesitant as has never been able to pass stone want ureteroscopy. Plan for URS today.   UA is negative for infection culture will not result in time we will plan to give antibiotics at the time of surgery.   We discussed the risk benefits and alternatives to ureteroscopy. This includes bleeding, infection, damage to surrounding structures including the urethra, bladder, ureter, and kidney. With these possible injuries resulting in need for intervention in the future. We discussed inability to remove all the stone and requiring follow-up ureteroscopy. We also discussed the possibility of not being able to gain access to the  kidney and the need for nephrostomy tube. Possibility of long-term stent was also discussed. The patient voiced their understanding and would like to proceed.

## 2024-03-31 NOTE — Anesthesia Procedure Notes (Signed)
 Procedure Name: LMA Insertion Date/Time: 03/31/2024 12:49 PM  Performed by: Carleton Garnette SAUNDERS, CRNAPre-anesthesia Checklist: Patient identified, Emergency Drugs available, Suction available, Patient being monitored and Timeout performed Patient Re-evaluated:Patient Re-evaluated prior to induction Oxygen  Delivery Method: Circle system utilized Preoxygenation: Pre-oxygenation with 100% oxygen  Induction Type: IV induction LMA: LMA inserted LMA Size: 4.0 Number of attempts: 1 Placement Confirmation: positive ETCO2 and breath sounds checked- equal and bilateral Tube secured with: Tape Dental Injury: Teeth and Oropharynx as per pre-operative assessment

## 2024-03-31 NOTE — Discharge Instructions (Addendum)
 DISCHARGE INSTRUCTIONS FOR Ureteroscopy MEDICATIONS:  Continue to use existing meds prescribed    ACTIVITY:  1. No strenuous activity x 1week  2. No driving while on narcotic pain medications  3. Drink plenty of water   4. Continue to walk at home - it is normal to see blood in the urine while the stent is in place, so keep active, but don't over do it.  5. May return to work/school tomorrow or when you feel ready  6. You may experience some pain when urinating in the kidney on the side that was operated on while the stent is in place this is normal  WHAT IS NORMAL TO EXPERIENCE: It is normal to feel the urge to urinate while the stent is in place It is normal to have blood in your urine while the stent is in place  It sometimes can be normal to have pain in your kidney when you urinate   BATHING:  1. You can shower and we recommend daily showers   DIET: You may return to your normal diet immediately. Because of the raw surface of your bladder, alcohol, spicy foods, acid type foods and drinks with caffeine may cause irritation or frequency and should be used in moderation. To keep your urine flowing freely and to avoid constipation, drink plenty of fluids during the day ( 8-10 glasses ). Tip: Avoid cranberry juice because it is very acidic.  SIGNS/SYMPTOMS TO CALL:  Please call us  if you have a fever greater than 101.5, uncontrolled nausea/vomiting, uncontrolled pain, dizziness, unable to urinate, bloody urine with clots greater than the size of a quarter, chest pain, shortness of breath, leg swelling, leg pain, redness around wound, drainage from wound, or any other concerns or questions.   You can reach us  at [phone].   FOLLOW-UP:  1. You you have been set up for f/u in 6-8 weeks

## 2024-03-31 NOTE — Op Note (Signed)
 Preoperative diagnosis: right ureteral calculus  Postoperative diagnosis: Patient had previously passed stone.  Procedure:  Cystoscopy right ureteroscopy  right retrograde pyelography with interpretation  Surgeon: Dr. Steffan Pea  Anesthesia: General  Complications: None  Intraoperative findings:  Pt had passed stone No irregularities ot ureter.  Right retrograde pylogram interpretation  EBL: Minimal  Specimens: None   Disposition of specimens: [facility] for stone analysis  Indication: [name] [name] is a 68 y.o.   patient with a  right ureteral stone and associated right symptoms patient has never been able to pass a stone was very eager to get treatment.  Counseled on trial of passage but wanted to proceed with ureteroscopy.. After reviewing the management options for treatment, the patient elected to proceed with the above surgical procedure(s). We have discussed the potential benefits and risks of the procedure, side effects of the proposed treatment, the likelihood of the patient achieving the goals of the procedure, and any potential problems that might occur during the procedure or recuperation. Informed consent has been obtained.   Description of procedure:  The patient was taken to the operating room and general anesthesia was induced.  The patient was placed in the dorsal lithotomy position, prepped and draped in the usual sterile fashion, and preoperative antibiotics were administered. A preoperative time-out was performed.   Cystourethroscopy was performed.  The patient's urethra was examined and was normal.  Evaluation of the prostate demonstrated mild prostatic hypertrophy.. Pan cystoscopy was performed and the bladder systematically examined in its entirety. There was no evidence for any bladder tumors, stones, or other mucosal pathology.    Attention then turned to the right ureteral orifice.  A 0.38 sensor guidewire was then advanced up  the right ureter into the renal pelvis under fluoroscopic guidance.  The 4.5 French ureteroscope was then advanced into the right ureter pan pyeloscopy revealed no stones no irregularities patient to pass stone..  Scope was then withdrawn revealing no stones no other abnormalities.  Prior to exiting the ureter a retrograde pyelograms performed demonstrate no abnormalities at the ureter no filling defects no filling defects within the pelvis no concern for stones.  The bladder was then emptied and the procedure ended.  The patient appeared to tolerate the procedure well and without complications.  The patient was able to be awakened and transferred to the recovery unit in satisfactory condition.   Disposition: The patient has been scheduled for followup in 6 weeks with a renal ultrasound.

## 2024-03-31 NOTE — Transfer of Care (Signed)
 Immediate Anesthesia Transfer of Care Note  Patient: [name] [name]  Procedure(s) Performed: CYSTOSCOPY/URETEROSCOPY (Right: Ureter) CYSTOSCOPY, WITH RETROGRADE PYELOGRAM (Right)  Patient Location: PACU  Anesthesia Type:General  Level of Consciousness: sedated  Airway & Oxygen  Therapy: Patient Spontanous Breathing and Patient connected to face mask oxygen   Post-op Assessment: Report given to RN and Post -op Vital signs reviewed and stable  Post vital signs: Reviewed and stable  Last Vitals:  Vitals Value Taken Time  BP 135/64 03/31/24 13:15  Temp    Pulse 61 03/31/24 13:16  Resp 16 03/31/24 13:16  SpO2 97 % 03/31/24 13:16  Vitals shown include unfiled device data.  Last Pain:  Vitals:   03/31/24 1043  TempSrc:   PainSc: 0-No pain         Complications: No notable events documented.

## 2024-03-31 NOTE — Anesthesia Postprocedure Evaluation (Signed)
 Anesthesia Post Note  Patient: [name] [name]  Procedure(s) Performed: CYSTOSCOPY/URETEROSCOPY (Right: Ureter) CYSTOSCOPY, WITH RETROGRADE PYELOGRAM (Right)     Patient location during evaluation: PACU Anesthesia Type: General Level of consciousness: awake and alert, oriented and patient cooperative Pain management: pain level controlled Vital Signs Assessment: post-procedure vital signs reviewed and stable Respiratory status: spontaneous breathing, nonlabored ventilation and respiratory function stable Cardiovascular status: blood pressure returned to baseline and stable Postop Assessment: no apparent nausea or vomiting Anesthetic complications: no   No notable events documented.  Last Vitals:  Vitals:   03/31/24 1330 03/31/24 1339  BP: (!) 142/71   Pulse: 69 68  Resp: 16 (!) 24  Temp:    SpO2: 100% 97%    Last Pain:  Vitals:   03/31/24 1330  TempSrc:   PainSc: Asleep                 Almarie CHRISTELLA Marchi

## 2024-04-01 ENCOUNTER — Encounter (HOSPITAL_COMMUNITY): Payer: Self-pay | Admitting: Urology

## 2024-04-04 ENCOUNTER — Ambulatory Visit

## 2024-04-04 ENCOUNTER — Ambulatory Visit (INDEPENDENT_AMBULATORY_CARE_PROVIDER_SITE_OTHER): Admitting: Physician Assistant

## 2024-04-04 VITALS — BP 163/89 | HR 84 | Ht 70.0 in | Wt 189.0 lb

## 2024-04-04 DIAGNOSIS — I6523 Occlusion and stenosis of bilateral carotid arteries: Secondary | ICD-10-CM | POA: Diagnosis not present

## 2024-04-04 DIAGNOSIS — I4891 Unspecified atrial fibrillation: Secondary | ICD-10-CM

## 2024-04-04 NOTE — Progress Notes (Signed)
 Office Note     CC:  follow up Requesting Provider:  Dettinger, Fonda LABOR, MD  HPI: [name] [name] is a 68 y.o. ([birth year]) male who presents for surveillance of carotid artery stenosis.  He denies any diagnosis of CVA or TIA since last office visit.  He also denies any strokelike symptoms including slurring speech, changes in vision, or one-sided weakness.  He exercises with a stationary bike.  He also walks for exercise however is limited by his left hip pain.  He is a former smoker.  He takes aspirin  and statin daily.   Past Medical History:  Diagnosis Date   Aortic stenosis 02/2008   Bicuspid aortic valve   Arthritis 12/2008   hand    Ascending aortic aneurysm    4.1 to 4.2 cm by last MRI 10/2013   Atrial fibrillation (HCC)    Bile duct calculus with acute cholecystitis    CAD (coronary artery disease) 02/2008   Calculus of common bile duct with acute pancreatitis 11/2016   Carotid stenosis    CKD (chronic kidney disease), stage III (HCC)    CTS (carpal tunnel syndrome)    bilateral    Diabetes (HCC)    Diastolic dysfunction 02/2008   Elevated LFTs    Fatty liver disease, nonalcoholic    History of kidney stones    Hyperlipidemia    Hypertension    Insomnia    Kidney stones    Nasal congestion    NIDDM (non-insulin  dependent diabetes mellitus)    x15 yrs   Numbness and tingling of both legs    OSA on CPAP 2006   Paresthesias in left hand 02/19/2004   Paresthesias in right hand 02/19/2004   Pulmonary nodules    Sinusitis    Syncope    Vitamin D  deficiency 06/13/2009    Past Surgical History:  Procedure Laterality Date   bladder cancer - operation - 01/21/18 - Dr Alvaro  01/21/2018   CARPAL TUNNEL RELEASE     bilateral    CHOLECYSTECTOMY  11/2016   COLONOSCOPY N/A 09/22/2018   Procedure: COLONOSCOPY;  Surgeon: Golda Claudis PENNER, MD;  Location: [facility];  Service: Endoscopy;  Laterality: N/A;  1:00   CORONARY ANGIOPLASTY WITH STENT PLACEMENT     to rt  coronary atery (Dr. Degert-cardiologist)   CYSTOSCOPY W/ RETROGRADES Right 03/31/2024   Procedure: CYSTOSCOPY, WITH RETROGRADE PYELOGRAM;  Surgeon: Shane Steffan BROCKS, MD;  Location: [facility];  Service: Urology;  Laterality: Right;   CYSTOSCOPY WITH RETROGRADE PYELOGRAM, URETEROSCOPY AND STENT PLACEMENT Left 03/07/2018   Procedure: CYSTOSCOPY WITH RETROGRADE PYELOGRAM,  AND STENT PLACEMENT;  Surgeon: Alvaro Hummer, MD;  Location: [facility];  Service: Urology;  Laterality: Left;   CYSTOSCOPY/URETEROSCOPY/HOLMIUM LASER/STENT PLACEMENT Right 03/31/2024   Procedure: CYSTOSCOPY/URETEROSCOPY;  Surgeon: Shane Steffan BROCKS, MD;  Location: [facility];  Service: Urology;  Laterality: Right;   ERCP N/A 11/24/2016   Procedure: ENDOSCOPIC RETROGRADE CHOLANGIOPANCREATOGRAPHY (ERCP);  Surgeon: Golda Claudis PENNER, MD;  Location: [facility];  Service: Endoscopy;  Laterality: N/A;   EXTRACORPOREAL SHOCK WAVE LITHOTRIPSY Left 03/17/2018   Procedure: LEFT EXTRACORPOREAL SHOCK WAVE LITHOTRIPSY (ESWL);  Surgeon: Watt Rush, MD;  Location: [facility];  Service: Urology;  Laterality: Left;   LOOP RECORDER INSERTION N/A 12/31/2023   Procedure: LOOP RECORDER INSERTION;  Surgeon: Nancey Eulas BRAVO, MD;  Location: [facility];  Service: Cardiovascular;  Laterality: N/A;   REMOVAL OF STONES N/A 11/24/2016   Procedure: REMOVAL OF STONES;  Surgeon: Golda Claudis  U, MD;  Location: [facility];  Service: Endoscopy;  Laterality: N/A;   SPHINCTEROTOMY N/A 11/24/2016   Procedure: SPHINCTEROTOMY;  Surgeon: Golda Claudis PENNER, MD;  Location: [facility];  Service: Endoscopy;  Laterality: N/A;   TISSUE AORTIC VALVE REPLACEMENT     2012    Social History   Socioeconomic History   Marital status: Married    Spouse name: Jerel   Number of children: 3   Years of education: Not on file   Highest education level: High school graduate  Occupational History   Occupation: Retired  Tobacco Use   Smoking status: Former    Current  packs/day: 0.00    Average packs/day: 1.5 packs/day for 28.0 years (42.0 ttl pk-yrs)    Types: Cigarettes    Start date: 07/06/1974    Quit date: 07/06/2002    Years since quitting: 21.7    Passive exposure: Never   Smokeless tobacco: Never  Vaping Use   Vaping status: Never Used  Substance and Sexual Activity   Alcohol use: No    Alcohol/week: 0.0 standard drinks of alcohol   Drug use: No   Sexual activity: Not on file  Other Topics Concern   Not on file  Social History Narrative   Not on file   Social Drivers of Health   Financial Resource Strain: Low Risk  (12/06/2023)   Overall Financial Resource Strain (CARDIA)    Difficulty of Paying Living Expenses: Not hard at all  Food Insecurity: No Food Insecurity (12/31/2023)   Hunger Vital Sign    Worried About Running Out of Food in the Last Year: Never true    Ran Out of Food in the Last Year: Never true  Transportation Needs: No Transportation Needs (12/31/2023)   PRAPARE - Administrator, Civil Service (Medical): No    Lack of Transportation (Non-Medical): No  Physical Activity: Sufficiently Active (12/06/2023)   Exercise Vital Sign    Days of Exercise per Week: 5 days    Minutes of Exercise per Session: 60 min  Stress: No Stress Concern Present (12/06/2023)   [employer] of Occupational Health - Occupational Stress Questionnaire    Feeling of Stress : Not at all  Social Connections: Moderately Integrated (12/31/2023)   Social Connection and Isolation Panel    Frequency of Communication with Friends and Family: Twice a week    Frequency of Social Gatherings with Friends and Family: Twice a week    Attends Religious Services: More than 4 times per year    Active Member of [employer] or Organizations: No    Attends Banker Meetings: Never    Marital Status: Married  Catering manager Violence: Not At Risk (12/31/2023)   Humiliation, Afraid, Rape, and Kick questionnaire    Fear of Current or Ex-Partner: No     Emotionally Abused: No    Physically Abused: No    Sexually Abused: No    Family History  Problem Relation Age of Onset   Hearing loss Mother    Alzheimer's disease Mother    Hearing loss Father    Diabetes Father    Diabetes Other        Family History     Current Outpatient Medications  Medication Sig Dispense Refill   acetaminophen  (TYLENOL ) 325 MG tablet Take 2 tablets (650 mg total) by mouth every 6 (six) hours as needed for mild pain (or Fever >/= 101). 12 tablet 0   amLODipine  (NORVASC ) 10 MG tablet TAKE  1 TABLET BY MOUTH DAILY 30 tablet 6   aspirin  EC 81 MG tablet Take 1 tablet (81 mg total) by mouth daily. Swallow whole.     atorvastatin  (LIPITOR) 40 MG tablet Take 1 tablet (40 mg total) by mouth daily. 90 tablet 3   blood glucose meter kit and supplies KIT Dispense based on patient and insurance preference. Use up to four times daily as directed. (FOR ICD-9 250.00, 250.01). 1 each 0   Cholecalciferol (VITAMIN D3) 50 MCG (2000 UT) TABS Take 2,000 Units by mouth 2 (two) times daily.     Continuous Glucose Receiver (FREESTYLE LIBRE 3 READER) DEVI 1 each by Does not apply route 4 (four) times daily. 1 each 1   Continuous Glucose Sensor (FREESTYLE LIBRE 3 PLUS SENSOR) MISC Change sensor every 15 days. 3 each 3   diclofenac  Sodium (VOLTAREN ) 1 % GEL Apply 2 g topically 3 (three) times daily as needed (for pain).     glipiZIDE  (GLUCOTROL ) 5 MG tablet Take 2 tablets (10 mg total) by mouth 2 (two) times daily before a meal. 360 tablet 3   insulin  aspart (NOVOLOG  FLEXPEN) 100 UNIT/ML FlexPen Inject 8 Units into the skin 3 (three) times daily with meals. 20 mL 3   insulin  degludec (TRESIBA  FLEXTOUCH) 100 UNIT/ML FlexTouch Pen Inject 16 Units into the skin daily after breakfast.     Insulin  Pen Needle (NOVOFINE PEN NEEDLE) 32G X 6 MM MISC 1 Needle by Does not apply route 3 (three) times daily. 100 each 3   Insulin  Pen Needle (TRUEPLUS 5-BEVEL PEN NEEDLES) 31G X 6 MM MISC UAD TID Dx E11.69  300 each 3   Lancets (ONETOUCH DELICA PLUS LANCET33G) MISC 4 (four) times daily.     metFORMIN  (GLUCOPHAGE ) 1000 MG tablet Take 1 tablet (1,000 mg total) by mouth 2 (two) times daily with a meal. 180 tablet 3   ONETOUCH ULTRA test strip Test BS 4 times daily Dx E11.69 400 each 3   polyethylene glycol (MIRALAX  / GLYCOLAX ) 17 g packet Take 17 g by mouth daily.     Probiotic Product (ALIGN PO) Take 1 capsule by mouth daily.     oxyCODONE  (OXY IR/ROXICODONE ) 5 MG immediate release tablet Take 5 mg by mouth every 6 (six) hours as needed for moderate pain (pain score 4-6). (Patient not taking: Reported on 04/04/2024)     No current facility-administered medications for this visit.    Allergies  Allergen Reactions   Empagliflozin  Diarrhea   Hydrochlorothiazide Nausea Only and Other (See Comments)    Headaches, too   Sulfa Antibiotics Nausea And Vomiting   Glyxambi  [Empagliflozin -Linagliptin ] Diarrhea, Itching and Other (See Comments)    He was able to take Jardiance  alone with no side effects so it was the Tradjenta  part that most likely gave him the reaction     REVIEW OF SYSTEMS:   [X]  denotes positive finding, [ ]  denotes negative finding Cardiac  Comments:  Chest pain or chest pressure:    Shortness of breath upon exertion:    Short of breath when lying flat:    Irregular heart rhythm:        Vascular    Pain in calf, thigh, or hip brought on by ambulation:    Pain in feet at night that wakes you up from your sleep:     Blood clot in your veins:    Leg swelling:         Pulmonary    Oxygen  at home:  Productive cough:     Wheezing:         Neurologic    Sudden weakness in arms or legs:     Sudden numbness in arms or legs:     Sudden onset of difficulty speaking or slurred speech:    Temporary loss of vision in one eye:     Problems with dizziness:         Gastrointestinal    Blood in stool:     Vomited blood:         Genitourinary    Burning when urinating:      Blood in urine:        Psychiatric    Major depression:         Hematologic    Bleeding problems:    Problems with blood clotting too easily:        Skin    Rashes or ulcers:        Constitutional    Fever or chills:      PHYSICAL EXAMINATION:  Vitals:   04/04/24 1018 04/04/24 1021  BP: (!) 162/94 (!) 163/89  Pulse: 84   SpO2: 98%   Weight: 189 lb (85.7 kg)   Height: 5' 10 (1.778 m)     General:  WDWN in NAD; vital signs documented above Gait: Not observed HENT: WNL, normocephalic Pulmonary: normal non-labored breathing Cardiac: regular HR Abdomen: soft, NT, no masses Skin: without rashes Vascular Exam/Pulses: symmetrical radial pulses Extremities: without ischemic changes, without Gangrene , without cellulitis; without open wounds;  Musculoskeletal: no muscle wasting or atrophy  Neurologic: A&O X 3; CN grossly intact Psychiatric:  The pt has normal affect.   Non-Invasive Vascular Imaging:   R ICA 60-79% stenosis L ICA 1-39% stenosis    ASSESSMENT/PLAN:: 68 y.o. male here for follow up for surveillance of carotid artery stenosis  Subjectively, he has not experienced any neurological events since last office visit.  Duplex is stable with 60 to 79% stenosis of the right ICA and 1 to 39% stenosis of the left ICA.  No indication for intervention of asymptomatic right ICA stenosis at this time.  He will continue his aspirin  and statin daily.  He will follow regularly with his PCP for management of chronic medical conditions as well as for routine lab work.  We will repeat carotid duplex in 6 months per protocol.  He knows to seek immediate medical attention should he develop any strokelike symptoms.   Donnice Sender, PA-C [facility] [phone]

## 2024-04-05 LAB — CUP PACEART REMOTE DEVICE CHECK
Date Time Interrogation Session: 20250930134009
Implantable Pulse Generator Implant Date: 20250627

## 2024-04-05 NOTE — Progress Notes (Signed)
 Remote Loop Recorder Transmission

## 2024-04-06 NOTE — Progress Notes (Signed)
 Remote Loop Recorder Transmission

## 2024-04-07 ENCOUNTER — Other Ambulatory Visit: Payer: Self-pay | Admitting: Vascular Surgery

## 2024-04-07 DIAGNOSIS — I6523 Occlusion and stenosis of bilateral carotid arteries: Secondary | ICD-10-CM

## 2024-04-17 ENCOUNTER — Ambulatory Visit: Payer: Self-pay | Admitting: Cardiovascular Disease

## 2024-04-19 ENCOUNTER — Other Ambulatory Visit

## 2024-04-19 ENCOUNTER — Encounter (INDEPENDENT_AMBULATORY_CARE_PROVIDER_SITE_OTHER): Payer: Self-pay | Admitting: Gastroenterology

## 2024-04-24 ENCOUNTER — Other Ambulatory Visit: Payer: Self-pay | Admitting: Cardiology

## 2024-05-01 NOTE — Progress Notes (Signed)
 05/02/2024 Name: [name] [name] MRN: [id number] DOB: [birth year]  Chief Complaint  Patient presents with   Diabetes    [name] [name] is a 68 y.o. year old male who presented for a telephone visit.They were referred to the pharmacist by their PCP for assistance in managing diabetes, medication access, and complex medication management.    Subjective: [name] [name] presents for a telephone visit today for diabetes follow-up. He states that he has been doing well and taking all medications as prescribed. He is concerned about night sweats associated with statin therapy. Counseled the patient that sweating and shakiness are symptoms of hypoglycemia. He has been experiencing symptoms at night, which is concerning for lows overnight. According to [facility] 3+ data, he has not experienced any lows in the past 7 days; however, he states that these episodes are sporadic and he hasn't experienced one recently. Provided education about myalgia side effects with statins and the low probability of statins causing night sweats.   Care Team: Primary Care Provider: Dettinger, Fonda LABOR, MD ; Next Scheduled Visit: 06/16/24  Medication Access/Adherence Current Pharmacy:  [facility] - [place], [place] - [address] [place] [address] Phone: [phone] Fax: [phone]  Patient reports affordability concerns with their medications: No  Patient reports access/transportation concerns to their pharmacy: No  Patient reports adherence concerns with their medications:  No    Diabetes: Current medications: glipizide  5 mg (two tablets twice daily), Novolog  10 units TID, Tresiba  16 units daily, metformin  1000 mg BID Medications tried in the past: Rybelsus , Ozempic  (GI upset), empagliflozin  (diarrhea) Statin: atorvastatin  40 mg daily LDL of 68 on 03/16/24 ACEi/ARB; NONE UACR of 792 on 03/16/24  A1c of 7.8% on 03/16/24  FREESTYLE LIBRE (phone) Date of Download: 05/02/24 Average Glucose: 158  mg/dL Time in Goal:  - Time in range 70-180: 72% - Time above range: 4 + 23% - Time below range: 0%  Patient denies hypoglycemic s/sx including dizziness, shakiness, sweating. Patient denies hyperglycemic symptoms including polyuria, polydipsia, polyphagia, nocturia, neuropathy, blurred vision.  Current meal patterns:  Discussed meal planning options and Plate method for healthy eating Avoid sugary drinks and desserts Incorporate balanced protein, non starchy veggies, 1 serving of carbohydrate with each meal Increase water  intake Increase physical activity as able   Objective: Lab Results  Component Value Date   HGBA1C 7.8 (H) 03/16/2024   Lab Results  Component Value Date   CREATININE 1.40 (H) 03/21/2024   BUN 23 03/21/2024   NA 140 03/21/2024   K 4.0 03/21/2024   CL 103 03/21/2024   CO2 23 03/21/2024   Lab Results  Component Value Date   CHOL 129 03/16/2024   HDL 32 (L) 03/16/2024   LDLCALC 68 03/16/2024   TRIG 169 (H) 03/16/2024   CHOLHDL 4.0 03/16/2024   Medications Reviewed Today     Reviewed by Bernette Falling, RPH (Pharmacist) on 05/02/24 at 1541  Med List Status: <None>   Medication Order Taking? Sig Documenting Provider Last Dose Status Informant  acetaminophen  (TYLENOL ) 325 MG tablet [id number]  Take 2 tablets (650 mg total) by mouth every 6 (six) hours as needed for mild pain (or Fever >/= 101). Pearlean Manus, MD  Active Self  aspirin  EC 81 MG tablet [id number]  Take 1 tablet (81 mg total) by mouth daily. Swallow whole. Alvan Dorn FALCON, MD  Active Self   Discontinued 05/02/24 1520 (Change in therapy) blood glucose meter kit and supplies KIT [id number]  Dispense based  on patient and insurance preference. Use up to four times daily as directed. (FOR ICD-9 250.00, 250.01). Dettinger, Fonda LABOR, MD  Active Self  Cholecalciferol (VITAMIN D3) 50 MCG (2000 UT) TABS [id number]  Take 2,000 Units by mouth 2 (two) times daily. [provider]  Active Self     Discontinued 05/02/24 1513 (Completed Course) Continuous Glucose Sensor (FREESTYLE LIBRE 3 PLUS SENSOR) MISC [id number]  Change sensor every 15 days. Dettinger, Fonda LABOR, MD  Active Self           Med Note MARISA, NATHANEL SAILOR   Fri Dec 31, 2023  6:50 PM) The patient stated he IS wearing a sensor at this time- unsure when it was applied  diclofenac  Sodium (VOLTAREN ) 1 % GEL [id number]  Apply 2 g topically 3 (three) times daily as needed (for pain). [provider]  Active Self  glipiZIDE  (GLUCOTROL ) 5 MG tablet [id number] Yes Take 2 tablets (10 mg total) by mouth 2 (two) times daily before a meal. Dettinger, Fonda LABOR, MD  Active Self           Med Note SOILA, LYLE BROCKS   Thu Mar 30, 2024 10:37 AM)    insulin  aspart (NOVOLOG  FLEXPEN) 100 UNIT/ML FlexPen [id number] Yes Inject 8 Units into the skin 3 (three) times daily with meals.  Patient taking differently: Inject 10 Units into the skin 3 (three) times daily with meals.   Dettinger, Fonda LABOR, MD  Active Self  insulin  degludec (TRESIBA  FLEXTOUCH) 100 UNIT/ML FlexTouch Pen [id number] Yes Inject 16 Units into the skin daily after breakfast. Miriam Norris, NP  Active Self  Insulin  Pen Needle (NOVOFINE PEN NEEDLE) 32G X 6 MM MISC [id number]  1 Needle by Does not apply route 3 (three) times daily. Dettinger, Fonda LABOR, MD  Active Self  Insulin  Pen Needle (TRUEPLUS 5-BEVEL PEN NEEDLES) 31G X 6 MM MISC [id number]  UAD TID Dx E11.69 Dettinger, Fonda LABOR, MD  Active Self  Lancets [facility] DELICA PLUS [place]) MISC [id number]  4 (four) times daily. [provider]  Active Self  metFORMIN  (GLUCOPHAGE ) 1000 MG tablet [id number] Yes Take 1 tablet (1,000 mg total) by mouth 2 (two) times daily with a meal. Dettinger, Fonda LABOR, MD  Active Self  [facility] ULTRA test strip [id number]  Test BS 4 times daily Dx E11.69 Dettinger, Fonda LABOR, MD  Active Self   Patient not taking:   Discontinued 05/02/24 1514 (Completed Course) polyethylene glycol (MIRALAX  / GLYCOLAX ) 17  g packet [id number]  Take 17 g by mouth daily. [provider]  Active Self  Probiotic Product (ALIGN PO) [id number]  Take 1 capsule by mouth daily. [provider]  Active Self            Assessment/Plan:  Diabetes: Currently uncontrolled. Goal of <7%.  Cardiorenal risk reduction is opportunities for improvement. Would benefit from an SGLT2i and/or ACEi/ARB. Not allergic to SGLT2i, was on Farxiga  in the past (2022) without issue. Will start Farxiga  today for cardiorenal protection and improved glucose control. Blood pressure is at goal of <130/80 mmHg. LDL is not at goal of <55 mg/dL. History of ischemic stroke and coronary artery disease. Will increase atorvastatin  to 80 mg daily. Reviewed long term cardiovascular and renal outcomes of uncontrolled blood sugar Reviewed goal A1c, goal fasting, and goal 2 hour post prandial glucose Recommend to:  INCREASE atorvastatin  to 80 mg daily Rx sent into pharmacy DECREASE glipizide  5 mg one tablet twice daily START Farxiga  10 mg daily Rx sent into  pharmacy Continue metformin , Tresiba , and Novolog  as prescribed Recommend to check glucose continuously with [facility] 3+ sensor (phone as reader) Discussed potential side effects of dehydration, genitourinary infections., Encouraged adequate hydration and genital hygiene.   Follow Up Plan:  PharmD: 2 weeks PCP: 06/16/24  Woodie Jock, PharmD PGY1 Pharmacy Resident  05/02/2024  Mliss Tarry Griffin, PharmD, BCACP, CPP Clinical Pharmacist, [facility]

## 2024-05-02 ENCOUNTER — Other Ambulatory Visit (INDEPENDENT_AMBULATORY_CARE_PROVIDER_SITE_OTHER)

## 2024-05-02 DIAGNOSIS — E785 Hyperlipidemia, unspecified: Secondary | ICD-10-CM

## 2024-05-02 DIAGNOSIS — E1169 Type 2 diabetes mellitus with other specified complication: Secondary | ICD-10-CM

## 2024-05-05 ENCOUNTER — Ambulatory Visit (INDEPENDENT_AMBULATORY_CARE_PROVIDER_SITE_OTHER)

## 2024-05-05 DIAGNOSIS — I4891 Unspecified atrial fibrillation: Secondary | ICD-10-CM

## 2024-05-05 MED ORDER — DAPAGLIFLOZIN PROPANEDIOL 10 MG PO TABS: 10.0000 mg | ORAL_TABLET | Freq: Every day | ORAL | 3 refills | Status: AC

## 2024-05-05 MED ORDER — ATORVASTATIN CALCIUM 80 MG PO TABS: 80.0000 mg | ORAL_TABLET | Freq: Every day | ORAL | 3 refills | Status: AC

## 2024-05-06 LAB — CUP PACEART REMOTE DEVICE CHECK
Date Time Interrogation Session: 20251031133914
Implantable Pulse Generator Implant Date: 20250627

## 2024-05-09 ENCOUNTER — Encounter: Payer: Self-pay | Admitting: *Deleted

## 2024-05-10 NOTE — Progress Notes (Signed)
 Remote Loop Recorder Transmission

## 2024-05-11 ENCOUNTER — Ambulatory Visit: Payer: Self-pay | Admitting: Cardiovascular Disease

## 2024-05-17 DIAGNOSIS — L570 Actinic keratosis: Secondary | ICD-10-CM | POA: Diagnosis not present

## 2024-05-22 ENCOUNTER — Other Ambulatory Visit: Payer: Self-pay | Admitting: *Deleted

## 2024-05-22 DIAGNOSIS — E785 Hyperlipidemia, unspecified: Secondary | ICD-10-CM

## 2024-05-26 DIAGNOSIS — N200 Calculus of kidney: Secondary | ICD-10-CM | POA: Diagnosis not present

## 2024-06-05 ENCOUNTER — Ambulatory Visit

## 2024-06-05 DIAGNOSIS — I4891 Unspecified atrial fibrillation: Secondary | ICD-10-CM

## 2024-06-05 LAB — CUP PACEART REMOTE DEVICE CHECK
Date Time Interrogation Session: 20251201134210
Implantable Pulse Generator Implant Date: 20250627

## 2024-06-08 ENCOUNTER — Ambulatory Visit: Payer: Self-pay | Admitting: Cardiovascular Disease

## 2024-06-09 NOTE — Progress Notes (Signed)
 Remote Loop Recorder Transmission

## 2024-06-15 ENCOUNTER — Ambulatory Visit: Admitting: Family Medicine

## 2024-06-15 ENCOUNTER — Encounter: Payer: Self-pay | Admitting: Family Medicine

## 2024-06-15 VITALS — BP 118/68 | HR 83 | Temp 97.8°F | Ht 70.0 in | Wt 187.0 lb

## 2024-06-15 DIAGNOSIS — Z95811 Presence of heart assist device: Secondary | ICD-10-CM | POA: Diagnosis not present

## 2024-06-15 DIAGNOSIS — M65341 Trigger finger, right ring finger: Secondary | ICD-10-CM

## 2024-06-15 DIAGNOSIS — E1169 Type 2 diabetes mellitus with other specified complication: Secondary | ICD-10-CM

## 2024-06-15 DIAGNOSIS — K638219 Small intestinal bacterial overgrowth, unspecified: Secondary | ICD-10-CM | POA: Diagnosis not present

## 2024-06-15 DIAGNOSIS — I10 Essential (primary) hypertension: Secondary | ICD-10-CM

## 2024-06-15 DIAGNOSIS — N182 Chronic kidney disease, stage 2 (mild): Secondary | ICD-10-CM

## 2024-06-15 DIAGNOSIS — E785 Hyperlipidemia, unspecified: Secondary | ICD-10-CM

## 2024-06-15 DIAGNOSIS — Z7984 Long term (current) use of oral hypoglycemic drugs: Secondary | ICD-10-CM | POA: Diagnosis not present

## 2024-06-15 DIAGNOSIS — I152 Hypertension secondary to endocrine disorders: Secondary | ICD-10-CM | POA: Diagnosis not present

## 2024-06-15 DIAGNOSIS — E1159 Type 2 diabetes mellitus with other circulatory complications: Secondary | ICD-10-CM | POA: Diagnosis not present

## 2024-06-15 DIAGNOSIS — Z794 Long term (current) use of insulin: Secondary | ICD-10-CM | POA: Diagnosis not present

## 2024-06-15 LAB — BAYER DCA HB A1C WAIVED: HB A1C (BAYER DCA - WAIVED): 7.1 % — ABNORMAL HIGH (ref 4.8–5.6)

## 2024-06-15 NOTE — Progress Notes (Signed)
 BP 118/68   Pulse 83   Temp 97.8 F (36.6 C)   Ht 5' 10 (1.778 m)   Wt 187 lb (84.8 kg)   SpO2 96%   BMI 26.83 kg/m    Subjective:   Patient ID: [name] [name], male    DOB: 07-15-1955, 68 y.o.   MRN: [id number]  HPI: [name] [name] is a 68 y.o. male presenting on 06/15/2024 for Medical Management of Chronic Issues, Diabetes, Hypertension, and Hyperlipidemia   Discussed the use of AI scribe software for clinical note transcription with the patient, who gave verbal consent to proceed.  History of Present Illness   [name] [name] is a 68 year old male with diabetes who presents for a recheck of his condition.  Glycemic control - Blood glucose levels have improved since starting Farxiga . - Current regimen includes Farxiga  10 mg daily, Tresiba  16 units, metformin  twice a day, and glipizide  5 mg twice a day (reduced from two tablets twice a day). - Blood sugar levels are mostly within target range, with occasional postprandial spikes after lunch. - Few episodes of blood glucose reaching 300.  Adverse effects of statin therapy - Experienced nausea when atorvastatin  dose was increased to 80 mg. - Nausea resolved after reducing atorvastatin  dose back to 40 mg. - Atorvastatin  is taken at bedtime.  Trigger finger symptoms - Pain and locking of the ring finger consistent with trigger finger. - Requests injection therapy for symptom relief. - Has received similar injections in the past.  Small intestinal bacterial overgrowth - History of bacterial overgrowth in the small intestine. - Rifaximin  was effective in preventing symptoms (nausea, vomiting, diarrhea, gas) when taken as needed, but cost is prohibitive. - Currently uses Miralax  daily as alternative management. - Experienced two episodes of severe nausea, vomiting, diarrhea, and gas in the past year.          Relevant past medical, surgical, family and social history reviewed and updated as indicated. Interim  medical history since our last visit reviewed. Allergies and medications reviewed and updated.  Review of Systems  Constitutional:  Negative for chills and fever.  Respiratory:  Negative for shortness of breath and wheezing.   Cardiovascular:  Negative for chest pain and leg swelling.  Musculoskeletal:  Positive for arthralgias.  Skin:  Negative for rash.  All other systems reviewed and are negative.   Per HPI unless specifically indicated above   Allergies as of 06/15/2024       Reactions   Empagliflozin  Diarrhea   Hydrochlorothiazide Nausea Only, Other (See Comments)   Headaches, too   Sulfa Antibiotics Nausea And Vomiting   Glyxambi  [empagliflozin -linagliptin ] Diarrhea, Itching, Other (See Comments)   He was able to take Jardiance  alone with no side effects so it was the Tradjenta  part that most likely gave him the reaction        Medication List        Accurate as of June 15, 2024 12:24 PM. If you have any questions, ask your nurse or doctor.          acetaminophen  325 MG tablet Commonly known as: TYLENOL  Take 2 tablets (650 mg total) by mouth every 6 (six) hours as needed for mild pain (or Fever >/= 101).   ALIGN PO Take 1 capsule by mouth daily.   aspirin  EC 81 MG tablet Take 1 tablet (81 mg total) by mouth daily. Swallow whole.   atorvastatin  80 MG tablet Commonly known as: LIPITOR Take 1 tablet (80  mg total) by mouth daily.   blood glucose meter kit and supplies Kit Dispense based on patient and insurance preference. Use up to four times daily as directed. (FOR ICD-9 250.00, 250.01).   dapagliflozin  propanediol 10 MG Tabs tablet Commonly known as: FARXIGA  Take 1 tablet (10 mg total) by mouth daily.   diclofenac  Sodium 1 % Gel Commonly known as: VOLTAREN  Apply 2 g topically 3 (three) times daily as needed (for pain).   FreeStyle Libre 3 Plus Sensor Misc Change sensor every 15 days.   glipiZIDE  5 MG tablet Commonly known as: GLUCOTROL  Take  2 tablets (10 mg total) by mouth 2 (two) times daily before a meal.   metFORMIN  1000 MG tablet Commonly known as: GLUCOPHAGE  Take 1 tablet (1,000 mg total) by mouth 2 (two) times daily with a meal.   NovoLOG  FlexPen 100 UNIT/ML FlexPen Generic drug: insulin  aspart Inject 8 Units into the skin 3 (three) times daily with meals. What changed: how much to take   OneTouch Delica Plus Lancet33G Misc 4 (four) times daily.   OneTouch Ultra test strip Generic drug: glucose blood Test BS 4 times daily Dx E11.69   polyethylene glycol 17 g packet Commonly known as: MIRALAX  / GLYCOLAX  Take 17 g by mouth daily.   Tresiba  FlexTouch 100 UNIT/ML FlexTouch Pen Generic drug: insulin  degludec Inject 16 Units into the skin daily after breakfast.   TRUEplus 5-Bevel Pen Needles 31G X 6 MM Misc Generic drug: Insulin  Pen Needle UAD TID Dx E11.69   Novofine Pen Needle 32G X 6 MM Misc Generic drug: Insulin  Pen Needle 1 Needle by Does not apply route 3 (three) times daily.   Vitamin D3 50 MCG (2000 UT) Tabs Take 2,000 Units by mouth 2 (two) times daily.         Objective:   BP 118/68   Pulse 83   Temp 97.8 F (36.6 C)   Ht 5' 10 (1.778 m)   Wt 187 lb (84.8 kg)   SpO2 96%   BMI 26.83 kg/m   Wt Readings from Last 3 Encounters:  06/15/24 187 lb (84.8 kg)  04/04/24 189 lb (85.7 kg)  03/31/24 189 lb 9.5 oz (86 kg)    Physical Exam Physical Exam   CHEST: Lungs clear to auscultation. CARDIOVASCULAR: Regular heart sounds, no murmurs. EXTREMITIES: No swelling in legs, good pulses.         Assessment & Plan:   Problem List Items Addressed This Visit       Cardiovascular and Mediastinum   Hypertension associated with diabetes (HCC)     Endocrine   Hyperlipidemia associated with type 2 diabetes mellitus (HCC) - Primary   Type 2 diabetes mellitus (HCC)     Genitourinary   CKD (chronic kidney disease) stage 2, GFR 60-89 ml/min   Relevant Orders   CBC with  Differential/Platelet   CMP14+EGFR   Lipid panel   TSH   Bayer DCA Hb A1c Waived   Vitamin B12     Other   Presence of heart assist device (HCC)       Type 2 diabetes mellitus with other specified complication Blood sugar levels improved with Farxiga . Current regimen includes Farxiga , Tresiba , metformin , and reduced glipizide . Well-controlled with occasional postprandial spikes. - Continue current regimen: Farxiga  10 mg, Tresiba  16 units, metformin  twice daily, glipizide  5 mg twice daily. - Recheck A1c for long-term glycemic control.  Hyperlipidemia Nausea resolved with atorvastatin  dose reduction. Plan to retry higher dose with dinner. - Retry atorvastatin   80 mg with dinner for one week. - If nausea persists, continue atorvastatin  40 mg.  Trigger finger Pain and locking in multiple fingers, ring finger most affected. Previous injections administered individually. - Schedule appointment for injection therapy, focus on most symptomatic finger.          Follow up plan: Return in about 3 months (around 09/13/2024), or if symptoms worsen or fail to improve, for Diabetes recheck.  Counseling provided for all of the vaccine components Orders Placed This Encounter  Procedures   CBC with Differential/Platelet   CMP14+EGFR   Lipid panel   TSH   Bayer DCA Hb A1c Waived   Vitamin B12    Fonda Levins, MD [facility] 06/15/2024, 12:24 PM

## 2024-06-16 ENCOUNTER — Ambulatory Visit: Payer: Self-pay | Admitting: Family Medicine

## 2024-06-16 LAB — CMP14+EGFR
ALT: 29 IU/L (ref 0–44)
AST: 22 IU/L (ref 0–40)
Albumin: 4.9 g/dL (ref 3.9–4.9)
Alkaline Phosphatase: 78 IU/L (ref 47–123)
BUN/Creatinine Ratio: 19 (ref 10–24)
BUN: 30 mg/dL — ABNORMAL HIGH (ref 8–27)
Bilirubin Total: 0.5 mg/dL (ref 0.0–1.2)
CO2: 20 mmol/L (ref 20–29)
Calcium: 9.8 mg/dL (ref 8.6–10.2)
Chloride: 104 mmol/L (ref 96–106)
Creatinine, Ser: 1.59 mg/dL — ABNORMAL HIGH (ref 0.76–1.27)
Globulin, Total: 2.4 g/dL (ref 1.5–4.5)
Glucose: 120 mg/dL — ABNORMAL HIGH (ref 70–99)
Potassium: 4.5 mmol/L (ref 3.5–5.2)
Sodium: 142 mmol/L (ref 134–144)
Total Protein: 7.3 g/dL (ref 6.0–8.5)
eGFR: 47 mL/min/1.73 — ABNORMAL LOW (ref >59)

## 2024-06-16 LAB — LIPID PANEL
Chol/HDL Ratio: 3.6 ratio (ref 0.0–5.0)
Cholesterol, Total: 115 mg/dL (ref 100–199)
HDL: 32 mg/dL — ABNORMAL LOW (ref >39)
LDL Chol Calc (NIH): 57 mg/dL (ref 0–99)
Triglycerides: 148 mg/dL (ref 0–149)
VLDL Cholesterol Cal: 26 mg/dL (ref 5–40)

## 2024-06-16 LAB — CBC WITH DIFFERENTIAL/PLATELET
Basophils Absolute: 0.1 x10E3/uL (ref 0.0–0.2)
Basos: 1 %
EOS (ABSOLUTE): 0.4 x10E3/uL (ref 0.0–0.4)
Eos: 5 %
Hematocrit: 42.2 % (ref 37.5–51.0)
Hemoglobin: 14 g/dL (ref 13.0–17.7)
Immature Grans (Abs): 0 x10E3/uL (ref 0.0–0.1)
Immature Granulocytes: 0 %
Lymphocytes Absolute: 1.5 x10E3/uL (ref 0.7–3.1)
Lymphs: 18 %
MCH: 32.6 pg (ref 26.6–33.0)
MCHC: 33.2 g/dL (ref 31.5–35.7)
MCV: 98 fL — ABNORMAL HIGH (ref 79–97)
Monocytes Absolute: 0.7 x10E3/uL (ref 0.1–0.9)
Monocytes: 9 %
Neutrophils Absolute: 5.5 x10E3/uL (ref 1.4–7.0)
Neutrophils: 67 %
Platelets: 219 x10E3/uL (ref 150–450)
RBC: 4.29 x10E6/uL (ref 4.14–5.80)
RDW: 13.1 % (ref 11.6–15.4)
WBC: 8.2 x10E3/uL (ref 3.4–10.8)

## 2024-06-16 LAB — VITAMIN B12: Vitamin B-12: 193 pg/mL — ABNORMAL LOW (ref 232–1245)

## 2024-06-16 LAB — TSH: TSH: 1.3 u[IU]/mL (ref 0.450–4.500)

## 2024-06-19 ENCOUNTER — Ambulatory Visit

## 2024-06-21 ENCOUNTER — Ambulatory Visit: Payer: Self-pay | Admitting: Family Medicine

## 2024-06-22 ENCOUNTER — Other Ambulatory Visit: Payer: Self-pay | Admitting: Family Medicine

## 2024-06-26 ENCOUNTER — Telehealth: Payer: Self-pay | Admitting: Pharmacist

## 2024-06-26 NOTE — Telephone Encounter (Signed)
" °  °  This patient is appearing on a report for being at risk of failing the measure for blood pressure control (HTN) this calendar year.   Hypertension Medications: none currently Was on lisinopril , but d/c'd by cardiology for hypotension Hydrochlorothiazide allergy  BP controlled now- off meds On SGLT2 for CKD protection         Mliss Tarry Griffin, PharmD, BCACP, CPP Clinical Pharmacist, [facility]     "

## 2024-07-06 ENCOUNTER — Ambulatory Visit

## 2024-07-06 DIAGNOSIS — I4891 Unspecified atrial fibrillation: Secondary | ICD-10-CM

## 2024-07-07 LAB — CUP PACEART REMOTE DEVICE CHECK
Date Time Interrogation Session: 20260101134211
Implantable Pulse Generator Implant Date: 20250627

## 2024-07-11 NOTE — Progress Notes (Signed)
 Remote Loop Recorder Transmission

## 2024-07-13 NOTE — Progress Notes (Signed)
 VERNARD GRAM                                          MRN: [id number]   07/13/2024   The [facility] Quality Team Specialist reviewed this patient medical record for the purposes of chart review for care gap closure. The following were reviewed: abstraction for care gap closure-controlling blood pressure.    [facility] Quality Team

## 2024-07-15 ENCOUNTER — Ambulatory Visit: Payer: Self-pay | Admitting: Cardiovascular Disease

## 2024-08-06 ENCOUNTER — Ambulatory Visit

## 2024-08-06 DIAGNOSIS — I4891 Unspecified atrial fibrillation: Secondary | ICD-10-CM

## 2024-08-08 LAB — CUP PACEART REMOTE DEVICE CHECK
Date Time Interrogation Session: 20260201133911
Implantable Pulse Generator Implant Date: 20250627

## 2024-08-11 NOTE — Progress Notes (Signed)
 Remote Loop Recorder Transmission

## 2024-09-06 ENCOUNTER — Encounter

## 2024-09-11 ENCOUNTER — Ambulatory Visit: Admitting: Cardiology

## 2024-09-20 ENCOUNTER — Ambulatory Visit: Admitting: Family Medicine

## 2024-10-17 ENCOUNTER — Ambulatory Visit

## 2024-10-17 ENCOUNTER — Encounter

## 2024-12-07 ENCOUNTER — Encounter
